# Patient Record
Sex: Male | Born: 1965
Health system: Southern US, Community
[De-identification: ages and names within clinical notes are randomized; demographics above are authoritative.]

## PROBLEM LIST (undated history)

## (undated) DIAGNOSIS — D649 Anemia, unspecified: Secondary | ICD-10-CM

## (undated) HISTORY — PX: APPENDECTOMY: SHX54

---

## 2020-12-20 DIAGNOSIS — E8809 Other disorders of plasma-protein metabolism, not elsewhere classified: Secondary | ICD-10-CM | POA: Insufficient documentation

## 2020-12-20 DIAGNOSIS — H538 Other visual disturbances: Secondary | ICD-10-CM | POA: Insufficient documentation

## 2020-12-20 HISTORY — DX: Other disorders of plasma-protein metabolism, not elsewhere classified: E88.09

## 2020-12-20 HISTORY — DX: Hypomagnesemia: E83.42

## 2020-12-20 HISTORY — DX: Other visual disturbances: H53.8

## 2020-12-31 ENCOUNTER — Telehealth: Payer: Self-pay | Admitting: Hematology

## 2020-12-31 NOTE — Telephone Encounter (Signed)
I received a call from Jan, navigator from [facility] in [place] for multiple myeloma. Pt has been scheduled to see Dr. Irene Limbo on 1/4 at [place]. Jan will provide the appt date and time to the pt. Aware for him to arrive 30 minutes early.

## 2020-12-31 NOTE — Progress Notes (Signed)
HEMATOLOGY/ONCOLOGY CONSULTATION NOTE  Date of Service: 01/01/2021  Patient Care Team: Pcp, No as PCP - General  CHIEF COMPLAINTS/PURPOSE OF CONSULTATION:  Multiple myeloma  HISTORY OF PRESENTING ILLNESS:   Donald Suarez is a wonderful 55 y.o. male who has been referred to [name] by Dr. Red Christians for evaluation and management of Multiple Myeloma. Pt is accompanied today by his brother-in-law and a [place] interpreter. The pt reports that he is doing well overall.   The pt reports that he was experiencing dizziness and blurry vision prior to his diagnosis. These symptoms started two months ago. He is unable to confirm any other new symptoms at that time. The pt was at work when he became confused and was taken to the hospital. He is still unable to drive due to his blurry vision. He reports minimal improvement in his vision after plasmapheresis and has seen an Opthalmalogist. Pt was started on CyBorD at [facility].   Pt denies any previous chronic medical conditions or chronic medications. The pt had an appendectomy in 2002 and denies any other surgeries. He has no known medication allergies. 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 Pt has received two COVID19 vaccines.   Of note prior to the patient's visit today, pt has had PET/CT completed on 12/26/2020 with results revealing "1. No focal osseous or soft tissue hypermetabolism to suggest metabolically active multiple myeloma. 2. Small lucencies noted within the right frontal calvarium, right T1 vertebral body and T4 vertebral body, nonspecific. Attention on follow-up imaging."  Pt has had MRI Brain completed on 12/26/2020 with results revealing "1. No definite T2 signal abnormality or pathologic enhancement along the visualized optic pathways, noting motion degradation of coronal STIR images through the orbits. No retrobulbar mass and symmetric extraocular muscles. 2. No acute intracranial abnormality. 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 3. Questionable small faintly enhancing lesions in the right parietal and left temporal calvarium, indeterminate but favored to represent vasculature and less likely multiple myelomatous disease. Heterogeneous clivus without associated enhancement, nonspecific."  Pt has had Right Iliac Creast BM Bx completed on 12/25/2020 with results revealing "95% cellular bone marrow with greater than 90% involvement by monoclonal plasma cells showing cytoplasmic lambda light chain restriction."  Most recent lab results (12/31/2020) of CBC is as follows: all values are WNL except for  RBC at 2.70, Hgb at 8.0, HCT at 23.2, RDW at 20.2, PLT at 129K, Sodium at 132, Glucose at 138, Albumin at 2.1, Total Protein at 11.3, AST at 60, ALT at 190. 12/31/2020 Beta 2 Microglobulin at 3.69 12/24/2020 M Spike at 8.03 g/dL 12/20/2020 IgG at 10231  On review of systems, pt reports blurry vision and denies bone pain, fatigue, loss of appetite, SOB, chest pain, headaches, dental pain, abdominal pain, leg swelling, tingling/numbness in hands/feet, pain along spine and any other symptoms.   On Social Hx the pt reports that he is non-smoker that does not drink much alcohol.   MEDICAL HISTORY:  None  SURGICAL HISTORY: Appendectomy in 2002  SOCIAL HISTORY: Social History   Socioeconomic History  . Marital status: Married    Spouse name: Not on file  . Number of children: Not on file  . Years of education: Not on file  . Highest education level: Not on file  Occupational History  . Not on file  Tobacco Use  . Smoking status: Not on file  . Smokeless tobacco: Not on file  Substance and Sexual Activity  . Alcohol  use: Not on file  . Drug use: Not on file  . Sexual activity: Not on file  Other Topics Concern  . Not on file  Social History Narrative  . Not on file   Social Determinants of Health   Financial Resource Strain: Not on file  Food Insecurity: Not on file  Transportation Needs: Not on file   Physical Activity: Not on file  Stress: Not on file  Social Connections: Not on file  Intimate Partner Violence: Not on file    FAMILY HISTORY: No family history on file.  ALLERGIES:  The pt has no medication allergies.  MEDICATIONS:  No current outpatient medications on file.   No current facility-administered medications for this visit.    REVIEW OF SYSTEMS:    10 Point review of Systems was done is negative except as noted above.  PHYSICAL EXAMINATION: ECOG PERFORMANCE STATUS: 1 - Symptomatic but completely ambulatory  . Vitals:   01/01/21 1115  BP: (!) 152/80  Pulse: 89  Resp: 15  Temp: 97.9 F (36.6 C)  SpO2: 100%   Filed Weights   01/01/21 1115  Weight: 175 lb 4.8 oz (79.5 kg)   .There is no height or weight on file to calculate BMI.  GENERAL:alert, in no acute distress and comfortable SKIN: no acute rashes, no significant lesions EYES: conjunctiva are pink and non-injected, sclera anicteric OROPHARYNX: MMM, no exudates, no oropharyngeal erythema or ulceration NECK: supple, no JVD LYMPH:  no palpable lymphadenopathy in the cervical, axillary or inguinal regions LUNGS: clear to auscultation b/l with normal respiratory effort HEART: regular rate & rhythm ABDOMEN:  normoactive bowel sounds , non tender, not distended. Extremity: no pedal edema PSYCH: alert & oriented x 3 with fluent speech NEURO: no focal motor/sensory deficits  LABORATORY DATA:  I have reviewed the data as listed  .No flowsheet data found.  .No flowsheet data found.   RADIOGRAPHIC STUDIES: I have personally reviewed the radiological images as listed and agreed with the findings in the report. No results found.  12/26/2020 [facility] MRI Brain:   12/26/2020 [facility] PET/CT:   ASSESSMENT & PLAN:   22 yo with   1) Newly diagnosed IgG Lambda Multiple myeloma with -anemia -renal insufficiency -no overt bone lesions Bx- 90% plasma cells Initial M spike 8.03g/dl FISH--pending  2)  s/p- Hyperviscosity syndrome requiring plasmapheresis.  PLAN: -Discussed patient's most recent labs from 12/31/2020, all values are WNL except for  RBC at 2.70, Hgb at 8.0, HCT at 23.2, RDW at 20.2, PLT at 129K, Sodium at 132, Glucose at 138, Albumin at 2.1, Total Protein at 11.3, AST at 60, ALT at 190. -Discussed 12/31/2020 Beta 2 Microglobulin at 3.69 -Discussed 12/24/2020 M Spike at 8.03 g/dL -Discussed 12/20/2020 IgG at 10231 -Advised pt that he has Multiple Myeloma based on his lab results.  -Advised pt that his dizziness was most likely caused by his anemia.  -Advised pt that the increased protein caused the confusion by clogging up his blood vessels and thickening his blood. -Discussed CRAB criteria: No hypercalcemia, no change in renal function, significant anemia, and no bone lesions identified.  -Advised pt that while Multiple Myeloma is not curable, it is treatable and may even reach remission.  -Advised pt that we would continue treatment with CyBorD. Discussed treatment frequency and burden of care. -Advised pt that final treament plan will be confirmed after genetics are evaluated for risk stratification.  -Advised pt that his functional status & work environment would determine his ability to continue  working. -Discussed CDC guidelines regarding the Glide booster. Recommend pt receive the booster ASAP. -Recommend pt drink at least 2L of water daily. -Recommend pt begin a daily baby ASA. -Recommend pt eat more nuts -Will begin pt on 5000 IU Vitamin D.  -Will get chemo-counseling ASAP -Will restart CyBorD in our clinic in 3-5 days -Will refer pt to our Education officer, museum ASAP -Will get labs today -Will see back in 2 weeks   FOLLOW UP: Labs today Chemo-counseling for CyBorD ASAP Plz schedule to start CyBOrD ASAP in 3-5 days (his planned dose from [facility] was due on 01/04/2021) MD visit in 2 weeks with C1D8 of treatment  Labs with D1 and D8 of each cycle of treatment. -social  worker referral ASAP for insurance and work disability questions.   All of the patients questions were answered with apparent satisfaction. The patient knows to call the clinic with any problems, questions or concerns.  I spent 60 mins counseling the patient face to face. The total time spent in the appointment was 80 minutes and more than 50% was on counseling and direct patient cares.    Sullivan Lone MD [place] AAHIVMS [facility] Hematology/Oncology Physician [facility]  (Office):       [phone] (Work cell):  [phone] (Fax):           [phone]  01/01/2021 12:40 PM  I, Yevette Edwards, am acting as a scribe for Dr. Sullivan Lone.   .I have reviewed the above documentation for accuracy and completeness, and I agree with the above. Brunetta Genera MD

## 2021-01-01 ENCOUNTER — Other Ambulatory Visit: Payer: Self-pay

## 2021-01-01 ENCOUNTER — Inpatient Hospital Stay: Payer: BC Managed Care – PPO

## 2021-01-01 ENCOUNTER — Inpatient Hospital Stay: Payer: BC Managed Care – PPO | Attending: Hematology | Admitting: Hematology

## 2021-01-01 VITALS — BP 152/80 | HR 89 | Temp 97.9°F | Resp 15 | Ht 66.0 in | Wt 175.3 lb

## 2021-01-01 DIAGNOSIS — D751 Secondary polycythemia: Secondary | ICD-10-CM | POA: Insufficient documentation

## 2021-01-01 DIAGNOSIS — D759 Disease of blood and blood-forming organs, unspecified: Secondary | ICD-10-CM

## 2021-01-01 DIAGNOSIS — Z7189 Other specified counseling: Secondary | ICD-10-CM

## 2021-01-01 DIAGNOSIS — Z79899 Other long term (current) drug therapy: Secondary | ICD-10-CM | POA: Diagnosis not present

## 2021-01-01 DIAGNOSIS — D649 Anemia, unspecified: Secondary | ICD-10-CM | POA: Insufficient documentation

## 2021-01-01 DIAGNOSIS — C9000 Multiple myeloma not having achieved remission: Secondary | ICD-10-CM

## 2021-01-01 DIAGNOSIS — D709 Neutropenia, unspecified: Secondary | ICD-10-CM | POA: Diagnosis not present

## 2021-01-01 DIAGNOSIS — Z5111 Encounter for antineoplastic chemotherapy: Secondary | ICD-10-CM | POA: Insufficient documentation

## 2021-01-01 DIAGNOSIS — T451X5A Adverse effect of antineoplastic and immunosuppressive drugs, initial encounter: Secondary | ICD-10-CM | POA: Insufficient documentation

## 2021-01-01 DIAGNOSIS — C9001 Multiple myeloma in remission: Secondary | ICD-10-CM | POA: Insufficient documentation

## 2021-01-01 DIAGNOSIS — N2889 Other specified disorders of kidney and ureter: Secondary | ICD-10-CM | POA: Diagnosis not present

## 2021-01-01 LAB — IRON AND TIBC
Iron: 141 ug/dL (ref 42–163)
Saturation Ratios: 73 % — ABNORMAL HIGH (ref 20–55)
TIBC: 193 ug/dL — ABNORMAL LOW (ref 202–409)
UIBC: 52 ug/dL — ABNORMAL LOW (ref 117–376)

## 2021-01-01 LAB — CMP (CANCER CENTER ONLY)
ALT: 164 U/L — ABNORMAL HIGH (ref 0–44)
AST: 57 U/L — ABNORMAL HIGH (ref 15–41)
Albumin: 2.4 g/dL — ABNORMAL LOW (ref 3.5–5.0)
Alkaline Phosphatase: 48 U/L (ref 38–126)
Anion gap: 4 — ABNORMAL LOW (ref 5–15)
BUN: 12 mg/dL (ref 6–20)
CO2: 26 mmol/L (ref 22–32)
Calcium: 8.6 mg/dL — ABNORMAL LOW (ref 8.9–10.3)
Chloride: 97 mmol/L — ABNORMAL LOW (ref 98–111)
Creatinine: 1.06 mg/dL (ref 0.61–1.24)
GFR, Estimated: >60 mL/min (ref >60)
Glucose, Bld: 281 mg/dL — ABNORMAL HIGH (ref 70–99)
Potassium: 3.6 mmol/L (ref 3.5–5.1)
Sodium: 127 mmol/L — ABNORMAL LOW (ref 135–145)
Total Bilirubin: 0.4 mg/dL (ref 0.3–1.2)
Total Protein: 12.9 g/dL — ABNORMAL HIGH (ref 6.5–8.1)

## 2021-01-01 LAB — CBC WITH DIFFERENTIAL/PLATELET
Abs Immature Granulocytes: 0.37 10*3/uL — ABNORMAL HIGH (ref 0.00–0.07)
Basophils Absolute: 0 10*3/uL (ref 0.0–0.1)
Basophils Relative: 0 %
Eosinophils Absolute: 0.2 10*3/uL (ref 0.0–0.5)
Eosinophils Relative: 2 %
HCT: 23.5 % — ABNORMAL LOW (ref 39.0–52.0)
Hemoglobin: 7.6 g/dL — ABNORMAL LOW (ref 13.0–17.0)
Immature Granulocytes: 6 %
Lymphocytes Relative: 35 %
Lymphs Abs: 2.3 10*3/uL (ref 0.7–4.0)
MCH: 28.8 pg (ref 26.0–34.0)
MCHC: 32.3 g/dL (ref 30.0–36.0)
MCV: 89 fL (ref 80.0–100.0)
Monocytes Absolute: 0.7 10*3/uL (ref 0.1–1.0)
Monocytes Relative: 11 %
Neutro Abs: 3 10*3/uL (ref 1.7–7.7)
Neutrophils Relative %: 46 %
Platelets: 155 10*3/uL (ref 150–400)
RBC: 2.64 MIL/uL — ABNORMAL LOW (ref 4.22–5.81)
RDW: 19.8 % — ABNORMAL HIGH (ref 11.5–15.5)
WBC: 6.5 10*3/uL (ref 4.0–10.5)
nRBC: 5.5 % — ABNORMAL HIGH (ref 0.0–0.2)

## 2021-01-01 LAB — LACTATE DEHYDROGENASE: LDH: 349 U/L — ABNORMAL HIGH (ref 98–192)

## 2021-01-01 LAB — FERRITIN: Ferritin: 4958 ng/mL — ABNORMAL HIGH (ref 24–336)

## 2021-01-01 LAB — VITAMIN B12: Vitamin B-12: 294 pg/mL (ref 180–914)

## 2021-01-01 LAB — VITAMIN D 25 HYDROXY (VIT D DEFICIENCY, FRACTURES): Vit D, 25-Hydroxy: 16.61 ng/mL — ABNORMAL LOW (ref 30–100)

## 2021-01-01 NOTE — Progress Notes (Signed)
START ON PATHWAY REGIMEN - Multiple Myeloma and Other Plasma Cell Dyscrasias     A cycle is every 21 days:     Dexamethasone      Bortezomib      Cyclophosphamide   **Always confirm dose/schedule in your pharmacy ordering system**  Patient Characteristics: Multiple Myeloma, Newly Diagnosed, Transplant Eligible, Unknown or Awaiting Test Results Disease Classification: Multiple Myeloma R-ISS Staging: Unknown Therapeutic Status: Newly Diagnosed Is Patient Eligible for Transplant<= Transplant Eligible Risk Status: Awaiting Test Results Intent of Therapy: Non-Curative / Palliative Intent, Discussed with Patient

## 2021-01-02 ENCOUNTER — Inpatient Hospital Stay: Payer: BC Managed Care – PPO

## 2021-01-02 ENCOUNTER — Inpatient Hospital Stay: Payer: BC Managed Care – PPO | Admitting: General Practice

## 2021-01-02 ENCOUNTER — Other Ambulatory Visit: Payer: Self-pay

## 2021-01-02 DIAGNOSIS — C9000 Multiple myeloma not having achieved remission: Secondary | ICD-10-CM

## 2021-01-02 LAB — KAPPA/LAMBDA LIGHT CHAINS
Kappa free light chain: 4.9 mg/L (ref 3.3–19.4)
Kappa, lambda light chain ratio: 0.2 — ABNORMAL LOW (ref 0.26–1.65)
Lambda free light chains: 24.4 mg/L (ref 5.7–26.3)

## 2021-01-02 LAB — BETA 2 MICROGLOBULIN, SERUM: Beta-2 Microglobulin: 2.9 mg/L — ABNORMAL HIGH (ref 0.6–2.4)

## 2021-01-02 NOTE — Progress Notes (Addendum)
[place] CSW Progress Notes  Met w patient and [name] interpreter at request of medical oncologist per urgent referral.  Concerns included loss of work and insurance.  Patient was accompanied by his sister and brother in law. He has recently moved to [place] area, after receiving initial treatment at [facility].  Patient states he has been unable to work since diagnosis, but hopes to return to work post treatment depending on his medical status.  As patient hopes to return to work prior to 12 months, CSW will not proceed w referral to [facility] for disability and Medicaid application assistance as patient appears not to meet criteria for there options.  CSW will refer to [place] Urgent Needs/[employer] program (application submitted 02/28/3347 [id number]), messaged Annamary Rummage as patient would like to apply for [facility].  Financial Advocate can also help patient apply for applicable [facility] financial assistance.  CSW also recommended patient contact DSS to apply for Food Stamps.  CSW team will continue to follow patient for additional support.  Edwyna Shell, LCSW Clinical Social Worker Phone:  [phone]

## 2021-01-03 ENCOUNTER — Other Ambulatory Visit: Payer: Self-pay | Admitting: Hematology

## 2021-01-03 LAB — HGB FRACTIONATION BY HPLC
Hgb A: 71.7 % — ABNORMAL LOW (ref 96.4–98.8)
Hgb C: 0 %
Hgb E: 22.8 % — ABNORMAL HIGH
Hgb F: 2.5 % — ABNORMAL HIGH (ref 0.0–2.0)
Hgb S: 0 %
Hgb Variant: 0 %

## 2021-01-03 LAB — MULTIPLE MYELOMA PANEL, SERUM
Albumin SerPl Elph-Mcnc: 3.8 g/dL (ref 2.9–4.4)
Albumin/Glob SerPl: 0.5 — ABNORMAL LOW (ref 0.7–1.7)
Alpha 1: 0.5 g/dL — ABNORMAL HIGH (ref 0.0–0.4)
Alpha2 Glob SerPl Elph-Mcnc: 0.7 g/dL (ref 0.4–1.0)
B-Globulin SerPl Elph-Mcnc: 1.2 g/dL (ref 0.7–1.3)
Gamma Glob SerPl Elph-Mcnc: 5.6 g/dL — ABNORMAL HIGH (ref 0.4–1.8)
Globulin, Total: 7.9 g/dL — ABNORMAL HIGH (ref 2.2–3.9)
IgA: 16 mg/dL — ABNORMAL LOW (ref 90–386)
IgG (Immunoglobin G), Serum: 8196 mg/dL — ABNORMAL HIGH (ref 603–1613)
IgM (Immunoglobulin M), Srm: 6 mg/dL — ABNORMAL LOW (ref 20–172)
M Protein SerPl Elph-Mcnc: 5.5 g/dL — ABNORMAL HIGH
Total Protein ELP: 11.7 g/dL — ABNORMAL HIGH (ref 6.0–8.5)

## 2021-01-03 LAB — HGB FRACTIONATION CASCADE: Hgb A2: 3 % (ref 1.8–3.2)

## 2021-01-03 MED ORDER — DEXAMETHASONE 4 MG PO TABS: ORAL_TABLET | ORAL | 3 refills | Status: DC

## 2021-01-03 MED ORDER — ONDANSETRON HCL 8 MG PO TABS: 8.0000 mg | ORAL_TABLET | Freq: Two times a day (BID) | ORAL | 1 refills | Status: DC | PRN

## 2021-01-03 MED ORDER — PROCHLORPERAZINE MALEATE 10 MG PO TABS: 10.0000 mg | ORAL_TABLET | Freq: Four times a day (QID) | ORAL | 1 refills | Status: DC | PRN

## 2021-01-03 MED ORDER — ACYCLOVIR 400 MG PO TABS: 400.0000 mg | ORAL_TABLET | Freq: Two times a day (BID) | ORAL | 3 refills | Status: DC

## 2021-01-03 MED FILL — DEXAMETHASONE 4 MG TABLET: 4 | 21 days supply | Qty: 10 | Fill #0

## 2021-01-03 MED FILL — ACYCLOVIR 400 MG TABLET: 400 | 30 days supply | Qty: 60 | Fill #0

## 2021-01-03 MED FILL — ONDANSETRON HCL 8 MG TABLET: 8 | 15 days supply | Qty: 30 | Fill #0

## 2021-01-04 ENCOUNTER — Other Ambulatory Visit: Payer: Self-pay | Admitting: *Deleted

## 2021-01-04 ENCOUNTER — Inpatient Hospital Stay: Payer: BC Managed Care – PPO

## 2021-01-04 ENCOUNTER — Other Ambulatory Visit: Payer: Self-pay

## 2021-01-04 VITALS — BP 145/72 | HR 92 | Temp 98.2°F | Resp 18

## 2021-01-04 DIAGNOSIS — C9000 Multiple myeloma not having achieved remission: Secondary | ICD-10-CM

## 2021-01-04 DIAGNOSIS — D649 Anemia, unspecified: Secondary | ICD-10-CM

## 2021-01-04 LAB — CBC WITH DIFFERENTIAL (CANCER CENTER ONLY)
Abs Immature Granulocytes: 0.15 10*3/uL — ABNORMAL HIGH (ref 0.00–0.07)
Basophils Absolute: 0 10*3/uL (ref 0.0–0.1)
Basophils Relative: 0 %
Eosinophils Absolute: 0.1 10*3/uL (ref 0.0–0.5)
Eosinophils Relative: 2 %
HCT: 24.3 % — ABNORMAL LOW (ref 39.0–52.0)
Hemoglobin: 7.9 g/dL — ABNORMAL LOW (ref 13.0–17.0)
Immature Granulocytes: 2 %
Lymphocytes Relative: 36 %
Lymphs Abs: 2.5 10*3/uL (ref 0.7–4.0)
MCH: 28.5 pg (ref 26.0–34.0)
MCHC: 32.5 g/dL (ref 30.0–36.0)
MCV: 87.7 fL (ref 80.0–100.0)
Monocytes Absolute: 1.2 10*3/uL — ABNORMAL HIGH (ref 0.1–1.0)
Monocytes Relative: 17 %
Neutro Abs: 3.1 10*3/uL (ref 1.7–7.7)
Neutrophils Relative %: 43 %
Platelet Count: 156 10*3/uL (ref 150–400)
RBC: 2.77 MIL/uL — ABNORMAL LOW (ref 4.22–5.81)
RDW: 19.7 % — ABNORMAL HIGH (ref 11.5–15.5)
WBC Count: 7 10*3/uL (ref 4.0–10.5)
nRBC: 9.1 % — ABNORMAL HIGH (ref 0.0–0.2)

## 2021-01-04 LAB — CMP (CANCER CENTER ONLY)
ALT: 102 U/L — ABNORMAL HIGH (ref 0–44)
AST: 42 U/L — ABNORMAL HIGH (ref 15–41)
Albumin: 2.2 g/dL — ABNORMAL LOW (ref 3.5–5.0)
Alkaline Phosphatase: 57 U/L (ref 38–126)
Anion gap: 6 (ref 5–15)
BUN: 11 mg/dL (ref 6–20)
CO2: 19 mmol/L — ABNORMAL LOW (ref 22–32)
Calcium: 8.1 mg/dL — ABNORMAL LOW (ref 8.9–10.3)
Chloride: 99 mmol/L (ref 98–111)
Creatinine: 1.12 mg/dL (ref 0.61–1.24)
GFR, Estimated: >60 mL/min (ref >60)
Glucose, Bld: 202 mg/dL — ABNORMAL HIGH (ref 70–99)
Potassium: 4 mmol/L (ref 3.5–5.1)
Sodium: 124 mmol/L — ABNORMAL LOW (ref 135–145)
Total Bilirubin: 0.3 mg/dL (ref 0.3–1.2)
Total Protein: 12.7 g/dL — ABNORMAL HIGH (ref 6.5–8.1)

## 2021-01-04 LAB — SAMPLE TO BLOOD BANK

## 2021-01-04 MED ORDER — BORTEZOMIB CHEMO SQ INJECTION 3.5 MG (2.5MG/ML)
1.3000 mg/m2 | Freq: Once | INTRAMUSCULAR | Status: AC
  Administered 2021-01-04: 2.5 mg via SUBCUTANEOUS
  Filled 2021-01-04: qty 1

## 2021-01-04 MED ORDER — SODIUM CHLORIDE 0.9 % IV SOLN
40.0000 mg | Freq: Once | INTRAVENOUS | Status: AC
  Administered 2021-01-04: 40 mg via INTRAVENOUS
  Filled 2021-01-04: qty 4

## 2021-01-04 MED ORDER — PALONOSETRON HCL INJECTION 0.25 MG/5ML
INTRAVENOUS | Status: AC
  Filled 2021-01-04: qty 5

## 2021-01-04 MED ORDER — SODIUM CHLORIDE 0.9 % IV SOLN
400.0000 mg/m2 | Freq: Once | INTRAVENOUS | Status: AC
  Administered 2021-01-04: 740 mg via INTRAVENOUS
  Filled 2021-01-04: qty 37

## 2021-01-04 MED ORDER — PALONOSETRON HCL INJECTION 0.25 MG/5ML
0.2500 mg | Freq: Once | INTRAVENOUS | Status: AC
  Administered 2021-01-04: 0.25 mg via INTRAVENOUS

## 2021-01-04 MED ORDER — SODIUM CHLORIDE 0.9 % IV SOLN
Freq: Once | INTRAVENOUS | Status: AC
  Filled 2021-01-04: qty 250

## 2021-01-04 MED FILL — PROCHLORPERAZINE 10 MG TAB: 10 | 7 days supply | Qty: 30 | Fill #0

## 2021-01-04 NOTE — Patient Instructions (Signed)
[facility] Discharge Instructions for Patients Receiving Chemotherapy  Today you received the following chemotherapy agents velcade, cyclophosmamide  To help prevent nausea and vomiting after your treatment, we encourage you to take your nausea medication as directed.   If you develop nausea and vomiting that is not controlled by your nausea medication, call the clinic.   BELOW ARE SYMPTOMS THAT SHOULD BE REPORTED IMMEDIATELY:  *FEVER GREATER THAN 100.5 F  *CHILLS WITH OR WITHOUT FEVER  NAUSEA AND VOMITING THAT IS NOT CONTROLLED WITH YOUR NAUSEA MEDICATION  *UNUSUAL SHORTNESS OF BREATH  *UNUSUAL BRUISING OR BLEEDING  TENDERNESS IN MOUTH AND THROAT WITH OR WITHOUT PRESENCE OF ULCERS  *URINARY PROBLEMS  *BOWEL PROBLEMS  UNUSUAL RASH Items with * indicate a potential emergency and should be followed up as soon as possible.  Feel free to call the clinic should you have any questions or concerns. The clinic phone number is [phone].  Please show the [place] at check-in to the Emergency Department and triage nurse.

## 2021-01-07 ENCOUNTER — Inpatient Hospital Stay: Payer: BC Managed Care – PPO

## 2021-01-07 ENCOUNTER — Other Ambulatory Visit: Payer: Self-pay

## 2021-01-07 VITALS — BP 135/83 | HR 98 | Temp 98.5°F | Resp 18

## 2021-01-07 DIAGNOSIS — C9000 Multiple myeloma not having achieved remission: Secondary | ICD-10-CM

## 2021-01-07 MED ORDER — PROCHLORPERAZINE MALEATE 10 MG PO TABS
ORAL_TABLET | ORAL | Status: AC
  Filled 2021-01-07: qty 1

## 2021-01-07 MED ORDER — BORTEZOMIB CHEMO SQ INJECTION 3.5 MG (2.5MG/ML)
1.3000 mg/m2 | Freq: Once | INTRAMUSCULAR | Status: AC
  Administered 2021-01-07: 2.5 mg via SUBCUTANEOUS
  Filled 2021-01-07: qty 1

## 2021-01-07 MED ORDER — PROCHLORPERAZINE MALEATE 10 MG PO TABS
10.0000 mg | ORAL_TABLET | Freq: Once | ORAL | Status: AC
  Administered 2021-01-07: 10 mg via ORAL

## 2021-01-07 NOTE — Patient Instructions (Signed)
[facility] Discharge Instructions for Patients Receiving Chemotherapy  Today you received the following chemotherapy agents: bortezomib.  To help prevent nausea and vomiting after your treatment, we encourage you to take your nausea medication as directed.   If you develop nausea and vomiting that is not controlled by your nausea medication, call the clinic.   BELOW ARE SYMPTOMS THAT SHOULD BE REPORTED IMMEDIATELY:  *FEVER GREATER THAN 100.5 F  *CHILLS WITH OR WITHOUT FEVER  NAUSEA AND VOMITING THAT IS NOT CONTROLLED WITH YOUR NAUSEA MEDICATION  *UNUSUAL SHORTNESS OF BREATH  *UNUSUAL BRUISING OR BLEEDING  TENDERNESS IN MOUTH AND THROAT WITH OR WITHOUT PRESENCE OF ULCERS  *URINARY PROBLEMS  *BOWEL PROBLEMS  UNUSUAL RASH Items with * indicate a potential emergency and should be followed up as soon as possible.  Feel free to call the clinic should you have any questions or concerns. The clinic phone number is [phone].  Please show the CHEMO ALERT CARD at check-in to the Emergency Department and triage nurse.   

## 2021-01-08 ENCOUNTER — Other Ambulatory Visit: Payer: Self-pay | Admitting: Hematology

## 2021-01-11 ENCOUNTER — Inpatient Hospital Stay: Payer: BC Managed Care – PPO

## 2021-01-11 ENCOUNTER — Inpatient Hospital Stay: Payer: BC Managed Care – PPO | Admitting: Hematology

## 2021-01-11 ENCOUNTER — Other Ambulatory Visit: Payer: Self-pay | Admitting: *Deleted

## 2021-01-11 ENCOUNTER — Inpatient Hospital Stay: Payer: BC Managed Care – PPO | Admitting: General Practice

## 2021-01-11 ENCOUNTER — Ambulatory Visit: Payer: BC Managed Care – PPO

## 2021-01-11 ENCOUNTER — Other Ambulatory Visit: Payer: Self-pay

## 2021-01-11 VITALS — BP 138/85 | HR 97 | Temp 98.0°F | Resp 18 | Ht 66.0 in | Wt 176.8 lb

## 2021-01-11 DIAGNOSIS — C9000 Multiple myeloma not having achieved remission: Secondary | ICD-10-CM

## 2021-01-11 DIAGNOSIS — Z5111 Encounter for antineoplastic chemotherapy: Secondary | ICD-10-CM | POA: Diagnosis not present

## 2021-01-11 DIAGNOSIS — D649 Anemia, unspecified: Secondary | ICD-10-CM

## 2021-01-11 LAB — CMP (CANCER CENTER ONLY)
ALT: 140 U/L — ABNORMAL HIGH (ref 0–44)
AST: 37 U/L (ref 15–41)
Albumin: 2.3 g/dL — ABNORMAL LOW (ref 3.5–5.0)
Alkaline Phosphatase: 111 U/L (ref 38–126)
Anion gap: 9 (ref 5–15)
BUN: 16 mg/dL (ref 6–20)
CO2: 22 mmol/L (ref 22–32)
Calcium: 8 mg/dL — ABNORMAL LOW (ref 8.9–10.3)
Chloride: 103 mmol/L (ref 98–111)
Creatinine: 0.82 mg/dL (ref 0.61–1.24)
GFR, Estimated: >60 mL/min (ref >60)
Glucose, Bld: 245 mg/dL — ABNORMAL HIGH (ref 70–99)
Potassium: 3.3 mmol/L — ABNORMAL LOW (ref 3.5–5.1)
Sodium: 134 mmol/L — ABNORMAL LOW (ref 135–145)
Total Bilirubin: 0.3 mg/dL (ref 0.3–1.2)
Total Protein: 8.7 g/dL — ABNORMAL HIGH (ref 6.5–8.1)

## 2021-01-11 LAB — CBC WITH DIFFERENTIAL (CANCER CENTER ONLY)
Abs Immature Granulocytes: 0.02 10*3/uL (ref 0.00–0.07)
Basophils Absolute: 0 10*3/uL (ref 0.0–0.1)
Basophils Relative: 0 %
Eosinophils Absolute: 0 10*3/uL (ref 0.0–0.5)
Eosinophils Relative: 0 %
HCT: 26 % — ABNORMAL LOW (ref 39.0–52.0)
Hemoglobin: 8.6 g/dL — ABNORMAL LOW (ref 13.0–17.0)
Immature Granulocytes: 1 %
Lymphocytes Relative: 58 %
Lymphs Abs: 1.6 10*3/uL (ref 0.7–4.0)
MCH: 28.9 pg (ref 26.0–34.0)
MCHC: 33.1 g/dL (ref 30.0–36.0)
MCV: 87.2 fL (ref 80.0–100.0)
Monocytes Absolute: 0.5 10*3/uL (ref 0.1–1.0)
Monocytes Relative: 20 %
Neutro Abs: 0.6 10*3/uL — ABNORMAL LOW (ref 1.7–7.7)
Neutrophils Relative %: 21 %
Platelet Count: 139 10*3/uL — ABNORMAL LOW (ref 150–400)
RBC: 2.98 MIL/uL — ABNORMAL LOW (ref 4.22–5.81)
RDW: 19.1 % — ABNORMAL HIGH (ref 11.5–15.5)
WBC Count: 2.7 10*3/uL — ABNORMAL LOW (ref 4.0–10.5)
nRBC: 3.3 % — ABNORMAL HIGH (ref 0.0–0.2)

## 2021-01-11 LAB — SAMPLE TO BLOOD BANK

## 2021-01-11 MED ORDER — SODIUM CHLORIDE 0.9 % IV SOLN
40.0000 mg | Freq: Once | INTRAVENOUS | Status: AC
  Administered 2021-01-11: 40 mg via INTRAVENOUS
  Filled 2021-01-11: qty 4

## 2021-01-11 MED ORDER — ERGOCALCIFEROL 1.25 MG (50000 UT) PO CAPS: 50000.0000 [IU] | ORAL_CAPSULE | ORAL | 2 refills | Status: DC

## 2021-01-11 MED ORDER — PALONOSETRON HCL INJECTION 0.25 MG/5ML
INTRAVENOUS | Status: AC
  Filled 2021-01-11: qty 5

## 2021-01-11 MED ORDER — SODIUM CHLORIDE 0.9 % IV SOLN
Freq: Once | INTRAVENOUS | Status: AC
  Filled 2021-01-11: qty 250

## 2021-01-11 MED ORDER — B COMPLEX VITAMINS PO CAPS
1.0000 | ORAL_CAPSULE | Freq: Every day | ORAL | 2 refills | Status: DC
Start: 2021-01-11 — End: 2021-04-17

## 2021-01-11 MED ORDER — BORTEZOMIB CHEMO SQ INJECTION 3.5 MG (2.5MG/ML)
1.3000 mg/m2 | Freq: Once | INTRAMUSCULAR | Status: AC
  Administered 2021-01-11: 2.5 mg via SUBCUTANEOUS
  Filled 2021-01-11: qty 1

## 2021-01-11 MED ORDER — PALONOSETRON HCL INJECTION 0.25 MG/5ML
0.2500 mg | Freq: Once | INTRAVENOUS | Status: AC
Start: 2021-01-11 — End: 2021-01-11
  Administered 2021-01-11: 0.25 mg via INTRAVENOUS

## 2021-01-11 NOTE — Progress Notes (Signed)
HEMATOLOGY/ONCOLOGY CONSULTATION NOTE  Date of Service: 01/11/2021  Patient Care Team: Pcp, No as PCP - General  CHIEF COMPLAINTS/PURPOSE OF CONSULTATION:  Multiple myeloma  HISTORY OF PRESENTING ILLNESS:   Donald Suarez is a wonderful 55 y.o. male who has been referred to [name] by Dr. Red Christians for evaluation and management of Multiple Myeloma. Pt is accompanied today by his brother-in-law and a [place] interpreter. The pt reports that he is doing well overall.   The pt reports that he was experiencing dizziness and blurry vision prior to his diagnosis. These symptoms started two months ago. He is unable to confirm any other new symptoms at that time. The pt was at work when he became confused and was taken to the hospital. He is still unable to drive due to his blurry vision. He reports minimal improvement in his vision after plasmapheresis and has seen an Opthalmalogist. Pt was started on CyBorD at [facility].   Pt denies any previous chronic medical conditions or chronic medications. The pt had an appendectomy in 2002 and denies any other surgeries. He has no known medication allergies. 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 Pt has received two COVID19 vaccines.   Of note prior to the patient's visit today, pt has had PET/CT completed on 12/26/2020 with results revealing "1. No focal osseous or soft tissue hypermetabolism to suggest metabolically active multiple myeloma. 2. Small lucencies noted within the right frontal calvarium, right T1 vertebral body and T4 vertebral body, nonspecific. Attention on follow-up imaging."  Pt has had MRI Brain completed on 12/26/2020 with results revealing "1. No definite T2 signal abnormality or pathologic enhancement along the visualized optic pathways, noting motion degradation of coronal STIR images through the orbits. No retrobulbar mass and symmetric extraocular muscles. 2. No acute intracranial abnormality. 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 3. Questionable small faintly enhancing lesions in the right parietal and left temporal calvarium, indeterminate but favored to represent vasculature and less likely multiple myelomatous disease. Heterogeneous clivus without associated enhancement, nonspecific."  Pt has had Right Iliac Creast BM Bx completed on 12/25/2020 with results revealing "95% cellular bone marrow with greater than 90% involvement by monoclonal plasma cells showing cytoplasmic lambda light chain restriction."  Most recent lab results (12/31/2020) of CBC is as follows: all values are WNL except for  RBC at 2.70, Hgb at 8.0, HCT at 23.2, RDW at 20.2, PLT at 129K, Sodium at 132, Glucose at 138, Albumin at 2.1, Total Protein at 11.3, AST at 60, ALT at 190. 12/31/2020 Beta 2 Microglobulin at 3.69 12/24/2020 M Spike at 8.03 g/dL 12/20/2020 IgG at 10231  On review of systems, pt reports blurry vision and denies bone pain, fatigue, loss of appetite, SOB, chest pain, headaches, dental pain, abdominal pain, leg swelling, tingling/numbness in hands/feet, pain along spine and any other symptoms.   On Social Hx the pt reports that he is non-smoker that does not drink much alcohol.    INTERVAL HISTORY:  Donald Suarez is a wonderful 55 y.o.male who is here for evaluation and management of Multiple Myeloma. The patient's last visit with [name] was on 01/01/2021. The pt reports that he is doing well overall.  The pt reports less dizziness and fatigue and improvement in his vision.  Lab results today (01/11/21) of CBC w/diff and CMP -reviewed with patient.  No new bone pains. Good po intake  MEDICAL HISTORY:  None  SURGICAL HISTORY: Appendectomy in 2002  SOCIAL HISTORY: Social History  Socioeconomic History  . Marital status: Married    Spouse name: Not on file  . Number of children: Not on file  . Years of education: Not on file  . Highest education level: Not on file  Occupational History  . Not on file  Tobacco Use  .  Smoking status: Not on file  . Smokeless tobacco: Not on file  Substance and Sexual Activity  . Alcohol use: Not on file  . Drug use: Not on file  . Sexual activity: Not on file  Other Topics Concern  . Not on file  Social History Narrative  . Not on file   Social Determinants of Health   Financial Resource Strain: Not on file  Food Insecurity: Not on file  Transportation Needs: Not on file  Physical Activity: Not on file  Stress: Not on file  Social Connections: Not on file  Intimate Partner Violence: Not on file    FAMILY HISTORY: No family history on file.  ALLERGIES:  The pt has no medication allergies.  MEDICATIONS:  Current Outpatient Medications  Medication Sig Dispense Refill  . acyclovir (ZOVIRAX) 400 MG tablet Take 1 tablet (400 mg total) by mouth 2 (two) times daily. 60 tablet 3  . dexamethasone (DECADRON) 4 MG tablet Take 10 tablets (40 mg) on Day 15 of each cycle. Repeat every 21 days.Take with breakfast. 10 tablet 3  . ondansetron (ZOFRAN) 8 MG tablet Take 1 tablet (8 mg total) by mouth 2 (two) times daily as needed for refractory nausea / vomiting. Start on day 3 after Cytoxan. 30 tablet 1  . prochlorperazine (COMPAZINE) 10 MG tablet Take 1 tablet (10 mg total) by mouth every 6 (six) hours as needed (Nausea or vomiting). 30 tablet 1   No current facility-administered medications for this visit.    REVIEW OF SYSTEMS:   A 10+ POINT REVIEW OF SYSTEMS WAS OBTAINED including neurology, dermatology, psychiatry, cardiac, respiratory, lymph, extremities, GI, GU, Musculoskeletal, constitutional, breasts, reproductive, HEENT.  All pertinent positives are noted in the HPI.  All others are negative.   PHYSICAL EXAMINATION: ECOG PERFORMANCE STATUS: 1 - Symptomatic but completely ambulatory  . There were no vitals filed for this visit. There were no vitals filed for this visit. .There is no height or weight on file to calculate BMI.  NAD GENERAL:alert, in no acute  distress and comfortable SKIN: no acute rashes, no significant lesions EYES: conjunctiva are pink and non-injected, sclera anicteric OROPHARYNX: MMM, no exudates, no oropharyngeal erythema or ulceration NECK: supple, no JVD LYMPH:  no palpable lymphadenopathy in the cervical, axillary or inguinal regions LUNGS: clear to auscultation b/l with normal respiratory effort HEART: regular rate & rhythm ABDOMEN:  normoactive bowel sounds , non tender, not distended. No palpable hepatosplenomegaly.  Extremity: no pedal edema PSYCH: alert & oriented x 3 with fluent speech NEURO: no focal motor/sensory deficits  LABORATORY DATA:  I have reviewed the data as listed  . CBC Latest Ref Rng & Units 01/11/2021 01/04/2021 01/01/2021  WBC 4.0 - 10.5 K/uL 2.7(L) 7.0 6.5  Hemoglobin 13.0 - 17.0 g/dL 8.6(L) 7.9(L) 7.6(L)  Hematocrit 39.0 - 52.0 % 26.0(L) 24.3(L) 23.5(L)  Platelets 150 - 400 K/uL 139(L) 156 155   ANC 600  . CMP Latest Ref Rng & Units 01/11/2021 01/04/2021 01/01/2021  Glucose 70 - 99 mg/dL 245(H) 202(H) 281(H)  BUN 6 - 20 mg/dL 16 11 12   Creatinine 0.61 - 1.24 mg/dL 0.82 1.12 1.06  Sodium 135 - 145 mmol/L 134(L) 124(L)  127(L)  Potassium 3.5 - 5.1 mmol/L 3.3(L) 4.0 3.6  Chloride 98 - 111 mmol/L 103 99 97(L)  CO2 22 - 32 mmol/L 22 19(L) 26  Calcium 8.9 - 10.3 mg/dL 8.0(L) 8.1(L) 8.6(L)  Total Protein 6.5 - 8.1 g/dL 8.7(H) 12.7(H) 12.9(H)  Total Bilirubin 0.3 - 1.2 mg/dL 0.3 0.3 0.4  Alkaline Phos 38 - 126 U/L 111 57 48  AST 15 - 41 U/L 37 42(H) 57(H)  ALT 0 - 44 U/L 140(H) 102(H) 164(H)     RADIOGRAPHIC STUDIES: I have personally reviewed the radiological images as listed and agreed with the findings in the report. No results found.  12/26/2020 [facility] MRI Brain:   12/26/2020 [facility] PET/CT:   ASSESSMENT & PLAN:   74 yo with   1) Newly diagnosed IgG Lambda Multiple myeloma with -anemia -renal insufficiency -no overt bone lesions Bx- 90% plasma cells Initial M spike  8.03g/dl FISH--pending from outside labs  2) s/p- Hyperviscosity syndrome requiring plasmapheresis.  3) neutropenia related to chemotherapy -- primarily cytoxan PLAN: -labs reviewed with patient. -significant neutropenia with cytoxan-- will hold cytoxan dose today and reduce subsequent does to 38m/m2 -continue Velcade at current dose -continue baby ASA. -meeting with Social work to discuss applying for social security disability benefits.  FOLLOW UP: Plz schedule C2 of CyBorD as ordered. Labs with D1 and D8 of treatment MD visit with C2D1      The total time spent in the appt was 30 minutes and more than 50% was on counseling and direct patient cares, ordering and mx of chemotherapy  All of the patient's questions were answered with apparent satisfaction. The patient knows to call the clinic with any problems, questions or concerns.    GSullivan LoneMD MEast CarrollAAHIVMS [facility]rad[facility] Place [facility] Laser CenterLLCCTexas [facility] Surgery Center AddisonHematology/Oncology Physician [facility] (Office):       [phone](Work cell):  [phone](Fax):           [phone] 01/11/2021 3:16 AM  I, JYevette Edwards am acting as a scribe for Dr. GSullivan Lone   .I have reviewed the above documentation for accuracy and completeness, and I agree with the above. .Brunetta GeneraMD

## 2021-01-11 NOTE — Progress Notes (Signed)
[place] CSW Progress Notes  Met w patient in infusion, [place] interpreter Ronny Flurry present and interpreting for patient.  He is out of work due to cancer and treatment needed.  He does not know when he will be able to work but is unable to do so now.  He would like to apply for Social Security disability benefits - CSW explained process and will submit referral to [facility].  [place] may also be able to help with a Medicaid and Food Stamps application, will request this help on patient behalf.   No other concerns at this time.  Edwyna Shell, LCSW Clinical Social Worker Phone:  [phone]

## 2021-01-11 NOTE — Patient Instructions (Signed)
[facility] Discharge Instructions for Patients Receiving Chemotherapy  Today you received the following chemotherapy agents: bortezomib.  To help prevent nausea and vomiting after your treatment, we encourage you to take your nausea medication as directed.   If you develop nausea and vomiting that is not controlled by your nausea medication, call the clinic.   BELOW ARE SYMPTOMS THAT SHOULD BE REPORTED IMMEDIATELY:  *FEVER GREATER THAN 100.5 F  *CHILLS WITH OR WITHOUT FEVER  NAUSEA AND VOMITING THAT IS NOT CONTROLLED WITH YOUR NAUSEA MEDICATION  *UNUSUAL SHORTNESS OF BREATH  *UNUSUAL BRUISING OR BLEEDING  TENDERNESS IN MOUTH AND THROAT WITH OR WITHOUT PRESENCE OF ULCERS  *URINARY PROBLEMS  *BOWEL PROBLEMS  UNUSUAL RASH Items with * indicate a potential emergency and should be followed up as soon as possible.  Feel free to call the clinic should you have any questions or concerns. The clinic phone number is [phone].  Please show the CHEMO ALERT CARD at check-in to the Emergency Department and triage nurse.   

## 2021-01-11 NOTE — Progress Notes (Signed)
Per Dr. Kale OK to treat with today's labs  

## 2021-01-14 ENCOUNTER — Ambulatory Visit: Payer: BC Managed Care – PPO

## 2021-01-14 ENCOUNTER — Other Ambulatory Visit: Payer: BC Managed Care – PPO

## 2021-01-14 LAB — MULTIPLE MYELOMA PANEL, SERUM
Albumin SerPl Elph-Mcnc: 2.8 g/dL — ABNORMAL LOW (ref 2.9–4.4)
Albumin/Glob SerPl: 0.6 — ABNORMAL LOW (ref 0.7–1.7)
Alpha 1: 0.2 g/dL (ref 0.0–0.4)
Alpha2 Glob SerPl Elph-Mcnc: 1.1 g/dL — ABNORMAL HIGH (ref 0.4–1.0)
B-Globulin SerPl Elph-Mcnc: 1 g/dL (ref 0.7–1.3)
Gamma Glob SerPl Elph-Mcnc: 3.1 g/dL — ABNORMAL HIGH (ref 0.4–1.8)
Globulin, Total: 5.3 g/dL — ABNORMAL HIGH (ref 2.2–3.9)
IgA: 27 mg/dL — ABNORMAL LOW (ref 90–386)
IgG (Immunoglobin G), Serum: 3850 mg/dL — ABNORMAL HIGH (ref 603–1613)
IgM (Immunoglobulin M), Srm: 11 mg/dL — ABNORMAL LOW (ref 20–172)
M Protein SerPl Elph-Mcnc: 3 g/dL — ABNORMAL HIGH
Total Protein ELP: 8.1 g/dL (ref 6.0–8.5)

## 2021-01-24 NOTE — Progress Notes (Incomplete)
HEMATOLOGY/ONCOLOGY CONSULTATION NOTE  Date of Service: 01/24/2021  Patient Care Team: Pcp, No as PCP - General  CHIEF COMPLAINTS/PURPOSE OF CONSULTATION:  Multiple myeloma  HISTORY OF PRESENTING ILLNESS:   Donald Suarez is a wonderful 55 y.o. male who has been referred to [name] by Dr. Red Christians for evaluation and management of Multiple Myeloma. Pt is accompanied today by his brother-in-law and a [place] interpreter. The pt reports that he is doing well overall.   The pt reports that he was experiencing dizziness and blurry vision prior to his diagnosis. These symptoms started two months ago. He is unable to confirm any other new symptoms at that time. The pt was at work when he became confused and was taken to the hospital. He is still unable to drive due to his blurry vision. He reports minimal improvement in his vision after plasmapheresis and has seen an Opthalmalogist. Pt was started on CyBorD at [facility].   Pt denies any previous chronic medical conditions or chronic medications. The pt had an appendectomy in 2002 and denies any other surgeries. He has no known medication allergies. 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 Pt has received two COVID19 vaccines.   Of note prior to the patient's visit today, pt has had PET/CT completed on 12/26/2020 with results revealing "1. No focal osseous or soft tissue hypermetabolism to suggest metabolically active multiple myeloma. 2. Small lucencies noted within the right frontal calvarium, right T1 vertebral body and T4 vertebral body, nonspecific. Attention on follow-up imaging."  Pt has had MRI Brain completed on 12/26/2020 with results revealing "1. No definite T2 signal abnormality or pathologic enhancement along the visualized optic pathways, noting motion degradation of coronal STIR images through the orbits. No retrobulbar mass and symmetric extraocular muscles. 2. No acute intracranial abnormality. 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 3. Questionable small faintly enhancing lesions in the right parietal and left temporal calvarium, indeterminate but favored to represent vasculature and less likely multiple myelomatous disease. Heterogeneous clivus without associated enhancement, nonspecific."  Pt has had Right Iliac Creast BM Bx completed on 12/25/2020 with results revealing "95% cellular bone marrow with greater than 90% involvement by monoclonal plasma cells showing cytoplasmic lambda light chain restriction."  Most recent lab results (12/31/2020) of CBC is as follows: all values are WNL except for  RBC at 2.70, Hgb at 8.0, HCT at 23.2, RDW at 20.2, PLT at 129K, Sodium at 132, Glucose at 138, Albumin at 2.1, Total Protein at 11.3, AST at 60, ALT at 190. 12/31/2020 Beta 2 Microglobulin at 3.69 12/24/2020 M Spike at 8.03 g/dL 12/20/2020 IgG at 10231  On review of systems, pt reports blurry vision and denies bone pain, fatigue, loss of appetite, SOB, chest pain, headaches, dental pain, abdominal pain, leg swelling, tingling/numbness in hands/feet, pain along spine and any other symptoms.   On Social Hx the pt reports that he is non-smoker that does not drink much alcohol.    INTERVAL HISTORY:  Donald Suarez is a wonderful 55 y.o.male who is here for evaluation and management of Multiple Myeloma. The patient's last visit with [name] was on 01/11/2021. The pt reports that he is doing well overall. The pt is here today for toxicity check prior to C2. ***  The pt reports ***  Lab results today 01/25/2021 of CBC w/diff and CMP is as follows: all values are WNL except for ***  On review of systems, pt reports *** and denies *** and any  other symptoms.  MEDICAL HISTORY:  None  SURGICAL HISTORY: Appendectomy in 2002  SOCIAL HISTORY: Social History   Socioeconomic History  . Marital status: Married    Spouse name: Not on file  . Number of children: Not on file  . Years of education: Not on file  . Highest education level:  Not on file  Occupational History  . Not on file  Tobacco Use  . Smoking status: Not on file  . Smokeless tobacco: Not on file  Substance and Sexual Activity  . Alcohol use: Not on file  . Drug use: Not on file  . Sexual activity: Not on file  Other Topics Concern  . Not on file  Social History Narrative  . Not on file   Social Determinants of Health   Financial Resource Strain: Not on file  Food Insecurity: Not on file  Transportation Needs: Not on file  Physical Activity: Not on file  Stress: Not on file  Social Connections: Not on file  Intimate Partner Violence: Not on file    FAMILY HISTORY: No family history on file.  ALLERGIES:  The pt has no medication allergies.  MEDICATIONS:  Current Outpatient Medications  Medication Sig Dispense Refill  . acyclovir (ZOVIRAX) 400 MG tablet Take 1 tablet (400 mg total) by mouth 2 (two) times daily. 60 tablet 3  . b complex vitamins capsule Take 1 capsule by mouth daily. 30 capsule 2  . dexamethasone (DECADRON) 4 MG tablet Take 10 tablets (40 mg) on Day 15 of each cycle. Repeat every 21 days.Take with breakfast. 10 tablet 3  . ergocalciferol (VITAMIN D2) 1.25 MG (50000 UT) capsule Take 1 capsule (50,000 Units total) by mouth once a week. 12 capsule 2  . ondansetron (ZOFRAN) 8 MG tablet Take 1 tablet (8 mg total) by mouth 2 (two) times daily as needed for refractory nausea / vomiting. Start on day 3 after Cytoxan. 30 tablet 1  . prochlorperazine (COMPAZINE) 10 MG tablet Take 1 tablet (10 mg total) by mouth every 6 (six) hours as needed (Nausea or vomiting). 30 tablet 1   No current facility-administered medications for this visit.    REVIEW OF SYSTEMS:   10 Point review of Systems was done is negative except as noted above.  PHYSICAL EXAMINATION: ECOG PERFORMANCE STATUS: 1 - Symptomatic but completely ambulatory  . There were no vitals filed for this visit. There were no vitals filed for this visit. .There is no height or  weight on file to calculate BMI.  *** GENERAL:alert, in no acute distress and comfortable SKIN: no acute rashes, no significant lesions EYES: conjunctiva are pink and non-injected, sclera anicteric OROPHARYNX: MMM, no exudates, no oropharyngeal erythema or ulceration NECK: supple, no JVD LYMPH:  no palpable lymphadenopathy in the cervical, axillary or inguinal regions LUNGS: clear to auscultation b/l with normal respiratory effort HEART: regular rate & rhythm ABDOMEN:  normoactive bowel sounds , non tender, not distended. Extremity: no pedal edema PSYCH: alert & oriented x 3 with fluent speech NEURO: no focal motor/sensory deficits    LABORATORY DATA:  I have reviewed the data as listed  . CBC Latest Ref Rng & Units 01/11/2021 01/04/2021 01/01/2021  WBC 4.0 - 10.5 K/uL 2.7(L) 7.0 6.5  Hemoglobin 13.0 - 17.0 g/dL 8.6(L) 7.9(L) 7.6(L)  Hematocrit 39.0 - 52.0 % 26.0(L) 24.3(L) 23.5(L)  Platelets 150 - 400 K/uL 139(L) 156 155   ANC 600  . CMP Latest Ref Rng & Units 01/11/2021 01/04/2021 01/01/2021  Glucose 70 -  99 mg/dL 245(H) 202(H) 281(H)  BUN 6 - 20 mg/dL 16 11 12   Creatinine 0.61 - 1.24 mg/dL 0.82 1.12 1.06  Sodium 135 - 145 mmol/L 134(L) 124(L) 127(L)  Potassium 3.5 - 5.1 mmol/L 3.3(L) 4.0 3.6  Chloride 98 - 111 mmol/L 103 99 97(L)  CO2 22 - 32 mmol/L 22 19(L) 26  Calcium 8.9 - 10.3 mg/dL 8.0(L) 8.1(L) 8.6(L)  Total Protein 6.5 - 8.1 g/dL 8.7(H) 12.7(H) 12.9(H)  Total Bilirubin 0.3 - 1.2 mg/dL 0.3 0.3 0.4  Alkaline Phos 38 - 126 U/L 111 57 48  AST 15 - 41 U/L 37 42(H) 57(H)  ALT 0 - 44 U/L 140(H) 102(H) 164(H)     RADIOGRAPHIC STUDIES: I have personally reviewed the radiological images as listed and agreed with the findings in the report. No results found.  12/26/2020 [facility] MRI Brain:   12/26/2020 [facility] PET/CT:   ASSESSMENT & PLAN:   64 yo with   1) Newly diagnosed IgG Lambda Multiple myeloma with -anemia -renal insufficiency -no overt bone lesions Bx- 90% plasma  cells Initial M spike 8.03g/dl FISH--pending from outside labs  2) s/p- Hyperviscosity syndrome requiring plasmapheresis.  3) neutropenia related to chemotherapy -- primarily cytoxan  PLAN: -Discussed labwork from today, 01/25/2021; ***   -significant neutropenia with cytoxan-- will hold cytoxan dose today and reduce subsequent does to 327m/m2 -continue Velcade at current dose -continue baby ASA. -Will see back in ***  FOLLOW UP: ***    The total time spent in the appointment was *** minutes and more than 50% was on counseling and direct patient cares.  All of the patient's questions were answered with apparent satisfaction. The patient knows to call the clinic with any problems, questions or concerns.    GSullivan LoneMD MNickersonAAHIVMS SAdventhealth Lake PlacidCPerformance Health Surgery CenterHematology/Oncology Physician CSsm [facility] (Office):       [phone](Work cell):  [phone](Fax):           [phone] 01/24/2021 5:28 PM  I, RReinaldo Raddle am acting as scribe for Dr. GSullivan Lone MD.

## 2021-01-25 ENCOUNTER — Other Ambulatory Visit: Payer: BC Managed Care – PPO

## 2021-01-25 ENCOUNTER — Inpatient Hospital Stay: Payer: BC Managed Care – PPO

## 2021-01-25 ENCOUNTER — Inpatient Hospital Stay: Payer: BC Managed Care – PPO | Admitting: Hematology

## 2021-01-28 ENCOUNTER — Other Ambulatory Visit: Payer: BC Managed Care – PPO

## 2021-01-28 ENCOUNTER — Ambulatory Visit: Payer: BC Managed Care – PPO | Admitting: Hematology

## 2021-01-28 ENCOUNTER — Other Ambulatory Visit: Payer: Self-pay | Admitting: Hematology

## 2021-01-28 ENCOUNTER — Telehealth: Payer: Self-pay

## 2021-01-28 ENCOUNTER — Ambulatory Visit: Payer: BC Managed Care – PPO

## 2021-01-28 ENCOUNTER — Telehealth: Payer: Self-pay | Admitting: Hematology

## 2021-01-28 NOTE — Telephone Encounter (Signed)
TCT patient and LVM regarding appointments today.

## 2021-01-28 NOTE — Telephone Encounter (Signed)
Hello,  Called patient regarding 01/31 appointments, added lab appointment per provider request. Contacted both home and mobile number, I was unable to leave a voicemail for both.

## 2021-02-01 ENCOUNTER — Telehealth: Payer: Self-pay | Admitting: *Deleted

## 2021-02-01 ENCOUNTER — Inpatient Hospital Stay: Payer: BC Managed Care – PPO | Attending: Hematology

## 2021-02-01 ENCOUNTER — Inpatient Hospital Stay: Payer: BC Managed Care – PPO

## 2021-02-01 NOTE — Telephone Encounter (Signed)
Patient missed appts today for lab and infusion. Attempted contact on home and mobile numbers provided. Left general message on home number (not identified) to call [place]. Called mobile number and whoever answered it hung up. Contacted Gustavo Lah (friend on [place]). He states he is related to patient through his wife. Mr. Tamala Julian given information regarding missed appts today and in past.Informed him that Dr. Irene Limbo expressed concerns regarding multiple missed appointments and treatments.  He states he will go see patient today and encourage patient to call cancer center.

## 2021-02-04 ENCOUNTER — Inpatient Hospital Stay: Payer: BC Managed Care – PPO

## 2021-04-17 ENCOUNTER — Other Ambulatory Visit: Payer: Self-pay | Admitting: Hematology

## 2022-05-13 ENCOUNTER — Emergency Department (HOSPITAL_COMMUNITY): Payer: BC Managed Care – PPO

## 2022-05-13 ENCOUNTER — Encounter (HOSPITAL_COMMUNITY): Payer: Self-pay

## 2022-05-13 ENCOUNTER — Inpatient Hospital Stay (HOSPITAL_COMMUNITY)
Admission: EM | Admit: 2022-05-13 | Discharge: 2022-05-20 | DRG: 841 | Disposition: A | Discharge status: Home Health | Payer: BC Managed Care – PPO | Attending: Internal Medicine | Admitting: Internal Medicine

## 2022-05-13 ENCOUNTER — Encounter: Payer: Self-pay | Admitting: Hematology

## 2022-05-13 ENCOUNTER — Other Ambulatory Visit: Payer: Self-pay

## 2022-05-13 DIAGNOSIS — D649 Anemia, unspecified: Secondary | ICD-10-CM | POA: Diagnosis not present

## 2022-05-13 DIAGNOSIS — D6959 Other secondary thrombocytopenia: Secondary | ICD-10-CM | POA: Diagnosis present

## 2022-05-13 DIAGNOSIS — C9001 Multiple myeloma in remission: Secondary | ICD-10-CM | POA: Diagnosis present

## 2022-05-13 DIAGNOSIS — C9000 Multiple myeloma not having achieved remission: Principal | ICD-10-CM | POA: Diagnosis present

## 2022-05-13 DIAGNOSIS — E1165 Type 2 diabetes mellitus with hyperglycemia: Secondary | ICD-10-CM

## 2022-05-13 DIAGNOSIS — Z7189 Other specified counseling: Secondary | ICD-10-CM

## 2022-05-13 DIAGNOSIS — E119 Type 2 diabetes mellitus without complications: Secondary | ICD-10-CM

## 2022-05-13 DIAGNOSIS — D630 Anemia in neoplastic disease: Secondary | ICD-10-CM | POA: Diagnosis present

## 2022-05-13 DIAGNOSIS — D696 Thrombocytopenia, unspecified: Secondary | ICD-10-CM

## 2022-05-13 DIAGNOSIS — E8809 Other disorders of plasma-protein metabolism, not elsewhere classified: Secondary | ICD-10-CM

## 2022-05-13 DIAGNOSIS — D759 Disease of blood and blood-forming organs, unspecified: Secondary | ICD-10-CM

## 2022-05-13 DIAGNOSIS — E663 Overweight: Secondary | ICD-10-CM | POA: Diagnosis present

## 2022-05-13 DIAGNOSIS — Z9221 Personal history of antineoplastic chemotherapy: Secondary | ICD-10-CM

## 2022-05-13 DIAGNOSIS — Z91199 Patient's noncompliance with other medical treatment and regimen due to unspecified reason: Secondary | ICD-10-CM

## 2022-05-13 DIAGNOSIS — Z6829 Body mass index (BMI) 29.0-29.9, adult: Secondary | ICD-10-CM

## 2022-05-13 DIAGNOSIS — E871 Hypo-osmolality and hyponatremia: Secondary | ICD-10-CM

## 2022-05-13 DIAGNOSIS — R739 Hyperglycemia, unspecified: Secondary | ICD-10-CM

## 2022-05-13 HISTORY — DX: Other disorders of plasma-protein metabolism, not elsewhere classified: E88.09

## 2022-05-13 HISTORY — DX: Anemia, unspecified: D64.9

## 2022-05-13 LAB — CBC WITH DIFFERENTIAL/PLATELET
Abs Immature Granulocytes: 1.2 10*3/uL — ABNORMAL HIGH (ref 0.00–0.07)
Band Neutrophils: 1 %
Basophils Absolute: 0.3 10*3/uL — ABNORMAL HIGH (ref 0.0–0.1)
Basophils Relative: 3 %
Blasts: 4 %
Eosinophils Absolute: 0.2 10*3/uL (ref 0.0–0.5)
Eosinophils Relative: 2 %
HCT: 19.9 % — ABNORMAL LOW (ref 39.0–52.0)
Hemoglobin: 6.7 g/dL — CL (ref 13.0–17.0)
Lymphocytes Relative: 33 %
Lymphs Abs: 3.4 10*3/uL (ref 0.7–4.0)
MCH: 31.3 pg (ref 26.0–34.0)
MCHC: 33.7 g/dL (ref 30.0–36.0)
MCV: 93 fL (ref 80.0–100.0)
Metamyelocytes Relative: 3 %
Monocytes Absolute: 0.6 10*3/uL (ref 0.1–1.0)
Monocytes Relative: 6 %
Myelocytes: 8 %
Neutro Abs: 4.1 10*3/uL (ref 1.7–7.7)
Neutrophils Relative %: 39 %
Platelets: 93 10*3/uL — ABNORMAL LOW (ref 150–400)
Promyelocytes Relative: 1 %
RBC: 2.14 MIL/uL — ABNORMAL LOW (ref 4.22–5.81)
RDW: 23.2 % — ABNORMAL HIGH (ref 11.5–15.5)
WBC: 10.3 10*3/uL (ref 4.0–10.5)
nRBC: 23 /100 WBC — ABNORMAL HIGH
nRBC: 29.5 % — ABNORMAL HIGH (ref 0.0–0.2)

## 2022-05-13 LAB — RENAL FUNCTION PANEL
Albumin: 1.6 g/dL — ABNORMAL LOW (ref 3.5–5.0)
Anion gap: 7 (ref 5–15)
BUN: 22 mg/dL — ABNORMAL HIGH (ref 6–20)
CO2: 23 mmol/L (ref 22–32)
Calcium: 10.2 mg/dL (ref 8.9–10.3)
Chloride: 99 mmol/L (ref 98–111)
Creatinine, Ser: 1.02 mg/dL (ref 0.61–1.24)
GFR, Estimated: >60 mL/min (ref >60)
Glucose, Bld: 183 mg/dL — ABNORMAL HIGH (ref 70–99)
Phosphorus: 5.5 mg/dL — ABNORMAL HIGH (ref 2.5–4.6)
Potassium: 3.8 mmol/L (ref 3.5–5.1)
Sodium: 129 mmol/L — ABNORMAL LOW (ref 135–145)

## 2022-05-13 LAB — FERRITIN: Ferritin: 774 ng/mL — ABNORMAL HIGH (ref 24–336)

## 2022-05-13 LAB — COMPREHENSIVE METABOLIC PANEL
ALT: 42 U/L (ref 0–44)
AST: 29 U/L (ref 15–41)
Albumin: 1.6 g/dL — ABNORMAL LOW (ref 3.5–5.0)
Alkaline Phosphatase: 43 U/L (ref 38–126)
Anion gap: 5 (ref 5–15)
BUN: 22 mg/dL — ABNORMAL HIGH (ref 6–20)
CO2: 26 mmol/L (ref 22–32)
Calcium: 10.2 mg/dL (ref 8.9–10.3)
Chloride: 97 mmol/L — ABNORMAL LOW (ref 98–111)
Creatinine, Ser: 0.97 mg/dL (ref 0.61–1.24)
GFR, Estimated: >60 mL/min (ref >60)
Glucose, Bld: 173 mg/dL — ABNORMAL HIGH (ref 70–99)
Potassium: 3.7 mmol/L (ref 3.5–5.1)
Sodium: 128 mmol/L — ABNORMAL LOW (ref 135–145)
Total Bilirubin: 0.4 mg/dL (ref 0.3–1.2)
Total Protein: >12 g/dL — ABNORMAL HIGH (ref 6.5–8.1)

## 2022-05-13 LAB — FOLATE: Folate: 9.2 ng/mL (ref >5.9)

## 2022-05-13 LAB — IRON AND TIBC
Iron: 124 ug/dL (ref 45–182)
Saturation Ratios: 82 % — ABNORMAL HIGH (ref 17.9–39.5)
TIBC: 152 ug/dL — ABNORMAL LOW (ref 250–450)
UIBC: 28 ug/dL

## 2022-05-13 LAB — LACTATE DEHYDROGENASE: LDH: 197 U/L — ABNORMAL HIGH (ref 98–192)

## 2022-05-13 LAB — HEMOGLOBIN AND HEMATOCRIT, BLOOD
HCT: 19.4 % — ABNORMAL LOW (ref 39.0–52.0)
Hemoglobin: 6 g/dL — CL (ref 13.0–17.0)

## 2022-05-13 LAB — RETICULOCYTES
Immature Retic Fract: 36.1 % — ABNORMAL HIGH (ref 2.3–15.9)
RBC.: 2.16 MIL/uL — ABNORMAL LOW (ref 4.22–5.81)
Retic Count, Absolute: 40.2 10*3/uL (ref 19.0–186.0)
Retic Ct Pct: 1.9 % (ref 0.4–3.1)

## 2022-05-13 LAB — HIV ANTIBODY (ROUTINE TESTING W REFLEX): HIV Screen 4th Generation wRfx: NONREACTIVE

## 2022-05-13 LAB — HEMOGLOBIN A1C
Hgb A1c MFr Bld: 8.2 % — ABNORMAL HIGH (ref 4.8–5.6)
Mean Plasma Glucose: 188.64 mg/dL

## 2022-05-13 LAB — POC OCCULT BLOOD, ED: Fecal Occult Bld: POSITIVE — AB

## 2022-05-13 LAB — VITAMIN B12: Vitamin B-12: 329 pg/mL (ref 180–914)

## 2022-05-13 LAB — PREPARE RBC (CROSSMATCH)

## 2022-05-13 MED ORDER — ACETAMINOPHEN 325 MG PO TABS
650.0000 mg | ORAL_TABLET | Freq: Four times a day (QID) | ORAL | Status: DC | PRN
  Administered 2022-05-13 – 2022-05-14 (×2): 650 mg via ORAL
  Filled 2022-05-13 (×3): qty 2

## 2022-05-13 MED ORDER — ONDANSETRON HCL 4 MG/2ML IJ SOLN: 4.0000 mg | Freq: Four times a day (QID) | INTRAMUSCULAR | Status: DC | PRN

## 2022-05-13 MED ORDER — ACETAMINOPHEN 650 MG RE SUPP: 650.0000 mg | Freq: Four times a day (QID) | RECTAL | Status: DC | PRN

## 2022-05-13 MED ORDER — ENSURE ENLIVE PO LIQD
237.0000 mL | Freq: Two times a day (BID) | ORAL | Status: DC
  Administered 2022-05-14 – 2022-05-16 (×4): 237 mL via ORAL

## 2022-05-13 MED ORDER — ONDANSETRON HCL 4 MG PO TABS: 4.0000 mg | ORAL_TABLET | Freq: Four times a day (QID) | ORAL | Status: DC | PRN

## 2022-05-13 MED ORDER — SODIUM CHLORIDE 0.9 % IV SOLN: Freq: Once | INTRAVENOUS | Status: AC

## 2022-05-13 MED ORDER — SODIUM CHLORIDE 0.9 % IV SOLN: 10.0000 mL/h | Freq: Once | INTRAVENOUS | Status: DC

## 2022-05-13 NOTE — ED Provider Notes (Signed)
?[place] DEPT ?Provider Note ? ? ?CSN: [id number] ?Arrival date & time: 05/13/22  0934 ? ?  ? ?History ? ?Chief Complaint  ?Patient presents with  ? Abnormal Lab  ? ? ?Donald Suarez is a 56 y.o. male. ? ?Patient complains of weakness and saw his primary care doctor and his hemoglobin was 6.7.  He does have a history of multiple myeloma but has not been getting treated for the last year and a half ? ?The history is provided by the patient and a relative. No language interpreter was used.  ?Weakness ?Severity:  Moderate ?Onset quality:  Sudden ?Timing:  Constant ?Progression:  Waxing and waning ?Chronicity:  Recurrent ?Context: not alcohol use   ?Relieved by:  Nothing ?Worsened by:  Nothing ?Ineffective treatments:  None tried ?Associated symptoms: no abdominal pain, no chest pain, no cough, no diarrhea, no frequency, no headaches and no seizures   ? ?  ? ?Home Medications ?Prior to Admission medications   ?Medication Sig Start Date End Date Taking? Authorizing Provider  ?ibuprofen (ADVIL) 200 MG tablet Take 200 mg by mouth 2 (two) times daily as needed (pain).   Yes [provider]  ?zolpidem (AMBIEN) 10 MG tablet Take 10 mg by mouth at bedtime as needed for sleep. 05/12/22  Yes [provider]  ?   ? ?Allergies    ?Patient has no known allergies.   ? ?Review of Systems   ?Review of Systems  ?Constitutional:  Negative for appetite change and fatigue.  ?HENT:  Negative for congestion, ear discharge and sinus pressure.   ?Eyes:  Negative for discharge.  ?Respiratory:  Negative for cough.   ?Cardiovascular:  Negative for chest pain.  ?Gastrointestinal:  Negative for abdominal pain and diarrhea.  ?Genitourinary:  Negative for frequency and hematuria.  ?Musculoskeletal:  Negative for back pain.  ?Skin:  Negative for rash.  ?Neurological:  Positive for weakness. Negative for seizures and headaches.  ?Psychiatric/Behavioral:  Negative for hallucinations.   ? ?Physical Exam ?Updated  Vital Signs ?BP (!) 146/90   Pulse 99   Temp 98.3 ?F (36.8 ?C) (Oral)   Resp 20   Ht 5' 6" (1.676 m)   Wt 81.6 kg   SpO2 94%   BMI 29.05 kg/m?  ?Physical Exam ?Vitals and nursing note reviewed.  ?Constitutional:   ?   Appearance: He is well-developed.  ?HENT:  ?   Head: Normocephalic.  ?   Nose: Nose normal.  ?Eyes:  ?   General: No scleral icterus. ?   Conjunctiva/sclera: Conjunctivae normal.  ?Neck:  ?   Thyroid: No thyromegaly.  ?Cardiovascular:  ?   Rate and Rhythm: Regular rhythm. Tachycardia present.  ?   Heart sounds: No murmur heard. ?  No friction rub. No gallop.  ?Pulmonary:  ?   Breath sounds: No stridor. No wheezing or rales.  ?Chest:  ?   Chest wall: No tenderness.  ?Abdominal:  ?   General: There is no distension.  ?   Tenderness: There is no abdominal tenderness. There is no rebound.  ?Genitourinary: ?   Comments: Heme positive brown stool ?Musculoskeletal:     ?   General: Normal range of motion.  ?   Cervical back: Neck supple.  ?Lymphadenopathy:  ?   Cervical: No cervical adenopathy.  ?Skin: ?   Findings: No erythema or rash.  ?Neurological:  ?   Mental Status: He is alert and oriented to person, place, and time.  ?   Motor: No  abnormal muscle tone.  ?   Coordination: Coordination normal.  ?Psychiatric:     ?   Behavior: Behavior normal.  ? ? ?ED Results / Procedures / Treatments   ?Labs ?(all labs ordered are listed, but only abnormal results are displayed) ?Labs Reviewed  ?CBC WITH DIFFERENTIAL/PLATELET - Abnormal; Notable for the following components:  ?    Result Value  ? RBC 2.14 (*)   ? Hemoglobin 6.7 (*)   ? HCT 19.9 (*)   ? RDW 23.2 (*)   ? Platelets 93 (*)   ? nRBC 29.5 (*)   ? Basophils Absolute 0.3 (*)   ? nRBC 23 (*)   ? Abs Immature Granulocytes 1.20 (*)   ? All other components within normal limits  ?COMPREHENSIVE METABOLIC PANEL - Abnormal; Notable for the following components:  ? Sodium 128 (*)   ? Chloride 97 (*)   ? Glucose, Bld 173 (*)   ? BUN 22 (*)   ? Total Protein  >12.0 (*)   ? Albumin 1.6 (*)   ? All other components within normal limits  ?IRON AND TIBC - Abnormal; Notable for the following components:  ? TIBC 152 (*)   ? Saturation Ratios 82 (*)   ? All other components within normal limits  ?FERRITIN - Abnormal; Notable for the following components:  ? Ferritin 774 (*)   ? All other components within normal limits  ?RETICULOCYTES - Abnormal; Notable for the following components:  ? RBC. 2.16 (*)   ? Immature Retic Fract 36.1 (*)   ? All other components within normal limits  ?POC OCCULT BLOOD, ED - Abnormal; Notable for the following components:  ? Fecal Occult Bld POSITIVE (*)   ? All other components within normal limits  ?VITAMIN B12  ?FOLATE  ?PATHOLOGIST SMEAR REVIEW  ?TYPE AND SCREEN  ?PREPARE RBC (CROSSMATCH)  ? ? ?EKG ?None ? ?Radiology ?DG Chest Port 1 View ? ?Result Date: 05/13/2022 ?CLINICAL DATA:  Weakness EXAM: PORTABLE CHEST 1 VIEW COMPARISON:  None Available. FINDINGS: The heart size and mediastinal contours are within normal limits. Slightly low lung volumes. No focal airspace consolidation, pleural effusion, or pneumothorax. The visualized skeletal structures are unremarkable. IMPRESSION: No active disease. Electronically Signed   By: Nicholas  Plundo D.O.   On: 05/13/2022 13:03   ? ?Procedures ?Procedures  ? ? ?Medications Ordered in ED ?Medications  ?0.9 %  sodium chloride infusion (has no administration in time range)  ? ? ?ED Course/ Medical Decision Making/ A&P ? ?CRITICAL CARE ?Performed by: Joseph Zammit ?Total critical care time: 35 minutes ?Critical care time was exclusive of separately billable procedures and treating other patients. ?Critical care was necessary to treat or prevent imminent or life-threatening deterioration. ?Critical care was time spent personally by me on the following activities: development of treatment plan with patient and/or surrogate as well as nursing, discussions with consultants, evaluation of patient's response to  treatment, examination of patient, obtaining history from patient or surrogate, ordering and performing treatments and interventions, ordering and review of laboratory studies, ordering and review of radiographic studies, pulse oximetry and re-evaluation of patient's condition. ? ? ? ?Patient with anemia and multiple myeloma.  I spoke with the oncologist he used to have.  I was told that he has not been there for a year and a half and has not been getting any care.  The patient supposedly does want care now so the hospitalist will admit him ?                        ?  Medical Decision Making ?Amount and/or Complexity of Data Reviewed ?Labs: ordered. ?Radiology: ordered. ?ECG/medicine tests: ordered. ? ?Risk ?Prescription drug management. ?Decision regarding hospitalization. ? ?This patient presents to the ED for concern of weakness, this involves an extensive number of treatment options, and is a complaint that carries with it a high risk of complications and morbidity.  The differential diagnosis includes anemia, worsening multiple myeloma ? ? ?Co morbidities that complicate the patient evaluation ? ?Multiple myeloma ? ? ?Additional history obtained: ? ?Additional history obtained from nephew ?External records from outside source obtained and reviewed including hospital record ? ? ?Lab Tests: ? ?I Ordered, and personally interpreted labs.  The pertinent results include: Hemoglobin which showed it to be low at 6.7, protein level greater than 12 ? ? ?Imaging Studies ordered: ? ?I ordered imaging studies including chest x-ray ?I independently visualized and interpreted imaging which showed no acute disease ?I agree with the radiologist interpretation ? ? ?Cardiac Monitoring: / EKG: ? ?The patient was maintained on a cardiac monitor.  I personally viewed and interpreted the cardiac monitored which showed an underlying rhythm of: Sinus tachycardia ? ? ?Consultations Obtained: ? ?I requested consultation with the  hematology oncology and hospital,  and discussed lab and imaging findings as well as pertinent plan - they recommend: Admit to the hospital and transfuse packed red blood cells with oncology consult at the hospital for his

## 2022-05-13 NOTE — ED Triage Notes (Signed)
Patient's nephew reports that the patient's doctor told him to come to the ED because he was anemic. ? ?

## 2022-05-13 NOTE — H&P (Signed)
?History and Physical  ? ? ?Patient: Donald Suarez MRN:[id number] DOB: 03/13/1966 ?DOA: 05/13/2022 ?DOS: the patient was seen and examined on 05/13/2022 ?PCP: Pcp, No  ?Patient coming from: Home ? ?Chief Complaint:  ?Chief Complaint  ?Patient presents with  ? Abnormal Lab  ? ?HPI: Donald Suarez is a 56 y.o. male with medical history significant of multiple myeloma. He has not followed up with oncology in over a year. Patient has limited ability to speak english. There is no objective interpreter for his dialect. Interpretation w/ nephew at bedside. He reports 1 week of generalized weakness and dizziness. It is worse with activity. He hasn't had any N/V/D or fevers. He hasn't had any sick contacts. He went to his PCP today and it was noted that he was anemic. So it was recommended that he come to the ED for evaluation.  ? ?Additionally, the patient reports poor appetite and bitter taste when he tries to eat. Again, no N/V. He otherwise denies any other aggravating or alleviating factors.   ? ?Review of Systems: As mentioned in the history of present illness. All other systems reviewed and are negative. ?Past Medical History:  ?Diagnosis Date  ? Anemia   ?Multiple Myeloma ? ?Past Surgical History:  ?Procedure Laterality Date  ? APPENDECTOMY    ? ?Social History:  reports that he has never smoked. He has never used smokeless tobacco. He reports that he does not drink alcohol and does not use drugs. ? ?No Known Allergies ? ?History reviewed. No pertinent family history. ? ?Prior to Admission medications   ?Medication Sig Start Date End Date Taking? Authorizing Provider  ?ibuprofen (ADVIL) 200 MG tablet Take 200 mg by mouth 2 (two) times daily as needed (pain).   Yes [provider]  ?zolpidem (AMBIEN) 10 MG tablet Take 10 mg by mouth at bedtime as needed for sleep. 05/12/22  Yes [provider]  ? ? ?Physical Exam: ?Vitals:  ? 05/13/22 1300 05/13/22 1330 05/13/22 1400 05/13/22 1430  ?BP: 111/76 (!) 151/92 111/68 (!)  146/90  ?Pulse: (!) 101 (!) 108 100 99  ?Resp: 12 (!) 32 (!) 37 20  ?Temp:      ?TempSrc:      ?SpO2: 99% 95% 95% 94%  ?Weight:      ?Height:      ? ?General: 56 y.o. male resting in bed in NAD ?Eyes: PERRL, normal sclera ?ENMT: Nares patent w/o discharge, orophaynx clear, dentition normal, ears w/o discharge/lesions/ulcers ?Neck: Supple, trachea midline ?Cardiovascular: tachy, +S1, S2, no m/g/r, equal pulses throughout ?Respiratory: CTABL, no w/r/r, normal WOB ?GI: BS+, NDNT, no masses noted, no organomegaly noted ?MSK: No e/c/c ?Neuro: A&O x 3, no focal deficits ?Psyc: Appropriate interaction and affect, calm/cooperative ? ?Data Reviewed: ? ?Na+ 128 ?Glucose  173 ?BUN 22 ?Scr  0.73 ?Albumin  1.6 ?Total protein > 12.0 ?WBC  10.3 ?Hgb  6.7 ? ?CXR: No active disease. ? ?EKG: sinus tach, no st elevations ? ?Assessment and Plan: ?Symptomatic anemia ?Anemia of chronic disease ?Thrombocytopenia ?    - place in obs, tele ?    - secondary to MM ?    - transfuse 1 unit pRBCs, follow up H&H ?    - irons studies as above ?    - denies hematuria, hematemesis, hematochezia ? ?Multiple Myeloma ?Hypoalbuminemia ?    - low albumin poor prognostic factor ?    - has not followed up with onco in well over a year ?    - spoke   with [facility], they will see him and set up follow up ? ?Hyponatremia ?    - chronicity unknown ?    - check urine osm, urine Na+ ?    - fluids ? ?Hyperglycemia ?    - no previous history of DM ?    - check A1c ? ?Advance Care Planning:   Code Status: Not on file  ? ?Consults: Oncology (Dr. Benay Spice) ? ?Family Communication: w/ family at bedside ? ?Severity of Illness: ?The appropriate patient status for this patient is OBSERVATION. Observation status is judged to be reasonable and necessary in order to provide the required intensity of service to ensure the patient's safety. The patient's presenting symptoms, physical exam findings, and initial radiographic and laboratory data in the context of their medical  condition is felt to place them at decreased risk for further clinical deterioration. Furthermore, it is anticipated that the patient will be medically stable for discharge from the hospital within 2 midnights of admission.  ? ?Author: ?Jonnie Finner, DO ?05/13/2022 2:35 PM ? ?For on call review [email or web address].  ?

## 2022-05-14 ENCOUNTER — Other Ambulatory Visit: Payer: Self-pay | Admitting: Hematology

## 2022-05-14 ENCOUNTER — Observation Stay (HOSPITAL_COMMUNITY): Payer: BC Managed Care – PPO

## 2022-05-14 DIAGNOSIS — D630 Anemia in neoplastic disease: Secondary | ICD-10-CM | POA: Diagnosis present

## 2022-05-14 DIAGNOSIS — D6959 Other secondary thrombocytopenia: Secondary | ICD-10-CM | POA: Diagnosis present

## 2022-05-14 DIAGNOSIS — Z5112 Encounter for antineoplastic immunotherapy: Secondary | ICD-10-CM | POA: Diagnosis not present

## 2022-05-14 DIAGNOSIS — H538 Other visual disturbances: Secondary | ICD-10-CM | POA: Diagnosis not present

## 2022-05-14 DIAGNOSIS — E119 Type 2 diabetes mellitus without complications: Secondary | ICD-10-CM | POA: Diagnosis not present

## 2022-05-14 DIAGNOSIS — C9000 Multiple myeloma not having achieved remission: Secondary | ICD-10-CM | POA: Diagnosis present

## 2022-05-14 DIAGNOSIS — Z794 Long term (current) use of insulin: Secondary | ICD-10-CM | POA: Diagnosis not present

## 2022-05-14 DIAGNOSIS — E871 Hypo-osmolality and hyponatremia: Secondary | ICD-10-CM | POA: Diagnosis present

## 2022-05-14 DIAGNOSIS — E663 Overweight: Secondary | ICD-10-CM | POA: Diagnosis present

## 2022-05-14 DIAGNOSIS — D649 Anemia, unspecified: Secondary | ICD-10-CM | POA: Diagnosis present

## 2022-05-14 DIAGNOSIS — Z9221 Personal history of antineoplastic chemotherapy: Secondary | ICD-10-CM | POA: Diagnosis not present

## 2022-05-14 DIAGNOSIS — D696 Thrombocytopenia, unspecified: Secondary | ICD-10-CM | POA: Diagnosis not present

## 2022-05-14 DIAGNOSIS — E1165 Type 2 diabetes mellitus with hyperglycemia: Secondary | ICD-10-CM | POA: Diagnosis present

## 2022-05-14 DIAGNOSIS — D759 Disease of blood and blood-forming organs, unspecified: Secondary | ICD-10-CM | POA: Diagnosis not present

## 2022-05-14 DIAGNOSIS — E8809 Other disorders of plasma-protein metabolism, not elsewhere classified: Secondary | ICD-10-CM | POA: Diagnosis present

## 2022-05-14 DIAGNOSIS — Z6829 Body mass index (BMI) 29.0-29.9, adult: Secondary | ICD-10-CM | POA: Diagnosis not present

## 2022-05-14 DIAGNOSIS — D751 Secondary polycythemia: Secondary | ICD-10-CM | POA: Diagnosis not present

## 2022-05-14 DIAGNOSIS — Z91199 Patient's noncompliance with other medical treatment and regimen due to unspecified reason: Secondary | ICD-10-CM | POA: Diagnosis not present

## 2022-05-14 LAB — CBC
HCT: 21.4 % — ABNORMAL LOW (ref 39.0–52.0)
Hemoglobin: 6.7 g/dL — CL (ref 13.0–17.0)
MCH: 28.8 pg (ref 26.0–34.0)
MCHC: 31.3 g/dL (ref 30.0–36.0)
MCV: 91.8 fL (ref 80.0–100.0)
Platelets: 78 10*3/uL — ABNORMAL LOW (ref 150–400)
RBC: 2.33 MIL/uL — ABNORMAL LOW (ref 4.22–5.81)
RDW: 21.1 % — ABNORMAL HIGH (ref 11.5–15.5)
WBC: 8.9 10*3/uL (ref 4.0–10.5)
nRBC: 34 % — ABNORMAL HIGH (ref 0.0–0.2)

## 2022-05-14 LAB — COMPREHENSIVE METABOLIC PANEL
ALT: 41 U/L (ref 0–44)
AST: 30 U/L (ref 15–41)
Albumin: 1.5 g/dL — ABNORMAL LOW (ref 3.5–5.0)
Alkaline Phosphatase: 43 U/L (ref 38–126)
Anion gap: 7 (ref 5–15)
BUN: 22 mg/dL — ABNORMAL HIGH (ref 6–20)
CO2: 23 mmol/L (ref 22–32)
Calcium: 9.6 mg/dL (ref 8.9–10.3)
Chloride: 103 mmol/L (ref 98–111)
Creatinine, Ser: 0.9 mg/dL (ref 0.61–1.24)
GFR, Estimated: >60 mL/min (ref >60)
Glucose, Bld: 160 mg/dL — ABNORMAL HIGH (ref 70–99)
Potassium: 4.4 mmol/L (ref 3.5–5.1)
Sodium: 133 mmol/L — ABNORMAL LOW (ref 135–145)
Total Bilirubin: 0.5 mg/dL (ref 0.3–1.2)
Total Protein: >12 g/dL — ABNORMAL HIGH (ref 6.5–8.1)

## 2022-05-14 LAB — HEMOGLOBIN AND HEMATOCRIT, BLOOD
HCT: 21.9 % — ABNORMAL LOW (ref 39.0–52.0)
Hemoglobin: 7.1 g/dL — ABNORMAL LOW (ref 13.0–17.0)

## 2022-05-14 LAB — BETA 2 MICROGLOBULIN, SERUM: Beta-2 Microglobulin: 4.4 mg/L — ABNORMAL HIGH (ref 0.6–2.4)

## 2022-05-14 LAB — KAPPA/LAMBDA LIGHT CHAINS
Kappa free light chain: 6.2 mg/L (ref 3.3–19.4)
Kappa, lambda light chain ratio: 0.08 — ABNORMAL LOW (ref 0.26–1.65)
Lambda free light chains: 77.1 mg/L — ABNORMAL HIGH (ref 5.7–26.3)

## 2022-05-14 LAB — PREPARE RBC (CROSSMATCH)

## 2022-05-14 LAB — PHOSPHORUS: Phosphorus: 3.7 mg/dL (ref 2.5–4.6)

## 2022-05-14 LAB — ABO/RH: ABO/RH(D): A POS

## 2022-05-14 MED ORDER — SODIUM CHLORIDE 0.9% IV SOLUTION: Freq: Once | INTRAVENOUS | Status: AC

## 2022-05-14 MED ORDER — PANTOPRAZOLE SODIUM 40 MG PO TBEC
40.0000 mg | DELAYED_RELEASE_TABLET | Freq: Every day | ORAL | Status: DC
  Administered 2022-05-14 – 2022-05-20 (×7): 40 mg via ORAL
  Filled 2022-05-14 (×8): qty 1

## 2022-05-14 MED ORDER — DEXAMETHASONE 6 MG PO TABS
40.0000 mg | ORAL_TABLET | Freq: Every day | ORAL | Status: AC
  Administered 2022-05-14 – 2022-05-17 (×4): 40 mg via ORAL
  Filled 2022-05-14: qty 1
  Filled 2022-05-14: qty 2
  Filled 2022-05-14: qty 1
  Filled 2022-05-14: qty 2
  Filled 2022-05-14: qty 10
  Filled 2022-05-14: qty 2

## 2022-05-14 NOTE — Progress Notes (Incomplete)
? ? ?HEMATOLOGY/ONCOLOGY INPATIENT NOTE ? ?Date of Service: 05/14/2022 ? ?Patient Care Team: ?Pcp, No as PCP - General ? ?CHIEF COMPLAINTS/PURPOSE OF CONSULTATION:  ?Evaluation and management of multiple myeloma ? ?HISTORY OF PRESENTING ILLNESS:  ?Please see previous note for details on initial presentation ?  ?INTERVAL HISTORY: ?Donald Suarez is a wonderful 56 y.o.male who is here for evaluation and management of Multiple Myeloma. The patient's last visit with [name] was on 01/01/2021. The pt reports that he is doing well overall. ? ?He notes that he has difficulties with transportation. We discussed that we can send transportation in addition to utilizing a Education officer, museum for other aid and resources. ? ?He reports fatigue that started within the last 2 weeks. ? ?He notes a loss of appetite that started within the last 2 weeks. ? ?He notes recent headaches with blurring of vision. He further reports some dizziness. ? ?He notes that he has lost some weight recently. ? ?We discussed the urgency for immediate treatment of his MM and that he will likely need blood transfusions until the cancer has been knocked back. We further discussed the necessity for a full body PET/CT scan to look for any new bone tumors which he was agreeable to. ? ?We discussed at least 2-3 L of water per day to keep kidneys flushed. We further discussed that he may even need to be put on plasmaphoresis if there are any signs of hyperviscosity syndrome for further help flushing out the abnormal protein. ? ?He currently works at [employer] and does not currently have disability insurance. We discussed that a Education officer, museum can help him apply for Medicaid. He will need to aply for short term disability and if he is not able to get that then he will need to apply for social security disability. We then discussed that he will need to stop working for the next few months until his MM has stabilized. ? ?We discussed starting Dexamethasone which he was agreeable  to. ? ?No confusion. ?No new bone pains. ?No other new or acute focal symptoms. ? ?Lab results today (05/14/22) of CBC w/diff and CMP -reviewed with patient. ?CBC shows symptomatic anemia with RBC count of 2.33, decreased Hgb of 6.7, thrombocytopenia with platelets of 78k and AIG elevated at 1.20. ?CMP shows elevated total protein of 12.0 and decreased albumin of 1.5 ?Phosphorous is at 3.7  ? ?MEDICAL HISTORY:  ?None ? ?SURGICAL HISTORY: ?Appendectomy in 2002 ? ?SOCIAL HISTORY: ?Social History  ? ?Socioeconomic History  ? Marital status: Married  ?  Spouse name: Not on file  ? Number of children: Not on file  ? Years of education: Not on file  ? Highest education level: Not on file  ?Occupational History  ? Not on file  ?Tobacco Use  ? Smoking status: Never  ? Smokeless tobacco: Never  ?Vaping Use  ? Vaping Use: Never used  ?Substance and Sexual Activity  ? Alcohol use: Never  ? Drug use: Never  ? Sexual activity: Not on file  ?Other Topics Concern  ? Not on file  ?Social History Narrative  ? Not on file  ? ?Social Determinants of Health  ? ?Financial Resource Strain: Not on file  ?Food Insecurity: Not on file  ?Transportation Needs: Not on file  ?Physical Activity: Not on file  ?Stress: Not on file  ?Social Connections: Not on file  ?Intimate Partner Violence: Not on file  ? ? ?FAMILY HISTORY: ?History reviewed. No pertinent family history. ? ?ALLERGIES:  The pt  has no medication allergies. ? ?MEDICATIONS:  ?Current Facility-Administered Medications  ?Medication Dose Route Frequency Provider Last Rate Last Admin  ? acetaminophen (TYLENOL) tablet 650 mg  650 mg Oral Q6H PRN Marylyn Ishihara, Tyrone A, DO   650 mg at 05/14/22 0750  ? Or  ? acetaminophen (TYLENOL) suppository 650 mg  650 mg Rectal Q6H PRN Marylyn Ishihara, Tyrone A, DO      ? feeding supplement (ENSURE ENLIVE / ENSURE PLUS) liquid 237 mL  237 mL Oral BID BM Kyle, Tyrone A, DO   237 mL at 05/14/22 0934  ? ondansetron (ZOFRAN) tablet 4 mg  4 mg Oral Q6H PRN Cherylann Ratel A, DO       ? Or  ? ondansetron (ZOFRAN) injection 4 mg  4 mg Intravenous Q6H PRN Marylyn Ishihara, Tyrone A, DO      ? ? ?REVIEW OF SYSTEMS:   ?A 10+ POINT REVIEW OF SYSTEMS WAS OBTAINED including neurology, dermatology, psychiatry, cardiac, respiratory, lymph, extremities, GI, GU, Musculoskeletal, constitutional, breasts, reproductive, HEENT.  All pertinent positives are noted in the HPI.  All others are negative.  ? ?PHYSICAL EXAMINATION: ?ECOG PERFORMANCE STATUS: 1 - Symptomatic but completely ambulatory ? ?. ?Vitals:  ? 05/14/22 0747 05/14/22 0814  ?BP: (!) 149/92 (!) 148/82  ?Pulse: 97 100  ?Resp: 20 20  ?Temp: 98.5 ?F (36.9 ?C) 98.3 ?F (36.8 ?C)  ?SpO2: 95%   ? ?Filed Weights  ? 05/13/22 0944  ?Weight: 180 lb (81.6 kg)  ? ?.Body mass index is 29.05 kg/m?. ?NAD*** ?GENERAL:alert, in no acute distress and comfortable ?SKIN: no acute rashes, no significant lesions ?EYES: conjunctiva are pink and non-injected, sclera anicteric ?NECK: supple, no JVD ?LYMPH:  no palpable lymphadenopathy in the cervical, axillary or inguinal regions ?LUNGS: clear to auscultation b/l with normal respiratory effort ?HEART: regular rate & rhythm ?ABDOMEN:  normoactive bowel sounds , non tender, not distended. ?Extremity: no pedal edema ?PSYCH: alert & oriented x 3 with fluent speech ?NEURO: no focal motor/sensory deficits ? ?LABORATORY DATA:  ?I have reviewed the data as listed ? ?. ? ?  Latest Ref Rng & Units 05/14/2022  ?  4:59 AM 05/13/2022  ?  4:39 PM 05/13/2022  ? 11:30 AM  ?CBC  ?WBC 4.0 - 10.5 K/uL 8.9    10.3    ?Hemoglobin 13.0 - 17.0 g/dL 6.7   6.0   6.7    ?Hematocrit 39.0 - 52.0 % 21.4   19.4   19.9    ?Platelets 150 - 400 K/uL 78    93    ? ?ANC 600 ? ?. ? ?  Latest Ref Rng & Units 05/14/2022  ?  4:59 AM 05/13/2022  ?  6:36 PM 05/13/2022  ? 11:30 AM  ?CMP  ?Glucose 70 - 99 mg/dL 160   183   173    ?BUN 6 - 20 mg/dL 22   22   22     ?Creatinine 0.61 - 1.24 mg/dL 0.90   1.02   0.97    ?Sodium 135 - 145 mmol/L 133   129   128    ?Potassium 3.5 - 5.1  mmol/L 4.4   3.8   3.7    ?Chloride 98 - 111 mmol/L 103   99   97    ?CO2 22 - 32 mmol/L 23   23   26     ?Calcium 8.9 - 10.3 mg/dL 9.6   10.2   10.2    ?Total Protein 6.5 - 8.1 g/dL >12.0    >  12.0    ?Total Bilirubin 0.3 - 1.2 mg/dL 0.5    0.4    ?Alkaline Phos 38 - 126 U/L 43    43    ?AST 15 - 41 U/L 30    29    ?ALT 0 - 44 U/L 41    42    ? ? ? ?RADIOGRAPHIC STUDIES: ?I have personally reviewed the radiological images as listed and agreed with the findings in the report. ?DG Chest Port 1 View ? ?Result Date: 05/13/2022 ?CLINICAL DATA:  Weakness EXAM: PORTABLE CHEST 1 VIEW COMPARISON:  None Available. FINDINGS: The heart size and mediastinal contours are within normal limits. Slightly low lung volumes. No focal airspace consolidation, pleural effusion, or pneumothorax. The visualized skeletal structures are unremarkable. IMPRESSION: No active disease. Electronically Signed   By: Davina Poke D.O.   On: 05/13/2022 13:03   ? ?12/26/2020 [facility] MRI Brain: ? ? ?12/26/2020 [facility] PET/CT: ? ? ?ASSESSMENT & PLAN:  ? ?43 yo with  ? ?1) Newly diagnosed IgG Lambda Multiple myeloma with ?-anemia ?-renal insufficiency ?-no overt bone lesions ?Bx- 90% plasma cells ?Initial M spike 8.03g/dl ?FISH--pending from outside labs ? ?2) s/p- Hyperviscosity syndrome requiring plasmapheresis. ? ?3) neutropenia related to chemotherapy -- primarily cytoxan ? ?PLAN: ?-Pt's primary contact is Blui Romah ?[phone] (son in law) ?-labs reviewed with patient. ?-continue Velcade at current dose ?-continue baby ASA. ?-Meet with Social work to discuss applying for social security disability benefits. ?-He was advised that if he experiences confusion or SOB following discharge from ED to return immediately.*** ?-Start Dexamethasone. ?-We discussed the urgency for immediate treatment of his MM and that he will likely need blood transfusions until the cancer has stabilized. ?-Schedule full body PET/CT scan. ?-We discussed at least 2-3 L of water  per day to keep kidneys flushed. We further discussed that he may even need to be put on plasmaphoresis if there are any signs of hyperviscosity syndrome for further help flushing out the abnormal protein. ?-He

## 2022-05-14 NOTE — Progress Notes (Signed)
?PROGRESS NOTE ? ? ? ?Donald Suarez  KVQ:[id number] DOB: 27-Jun-1966 DOA: 05/13/2022 ?PCP: Pcp, No  ? ?Brief Narrative: 56 year old male with history of multiple myeloma lost to follow-up.  He was admitted to the hospital with fatigue and dizziness he also complained of headache and some blurring of vision.  He was noted to have hemoglobin of 6 on admission with a platelets of 78,000.  Patient was transfused 2 units.  Patient seen by oncology Dr Irene Limbo concern for hyperviscosity and needing plasmapheresis.  Patient will be transferred to [facility] for the same.  Nephrology and interventional radiology consulted.  Jalan for Port-A-Cath placement. ? ? ?Assessment & Plan: ?  ?Principal Problem: ?  Symptomatic anemia ?Active Problems: ?  Multiple myeloma not having achieved remission ([place]) ?  Hyponatremia ?  Hyperglycemia ?  Thrombocytopenia ([place]) ?  Hypoalbuminemia ? ? ?#1 multiple myeloma-patient was lost to follow-up.  He had admission to [facility] for hyperviscosity and had plasmapheresis done a year ago.  Seen by Dr. Irene Limbo recommending plasmapheresis, patient being transferred to [facility].  Nephrology consulted. ?Leemon consulted for Port-A-Cath placement and HD catheter placement. ?Total protein more than 12 ?Concern for severe hyperviscosity and complications especially after receiving blood transfusion. ?Patient was seen today with his nephew in the room.  Encouraged to drink fluids. ?Hyperviscosity labs pending. ?Oncology planning outpatient chemo ?Outpatient PET/CT ? ?#2 symptomatic anemia received 2 units of blood transfusion ?Hemoglobin improved to 7.1 after transfusion. ?Blood transfusion for hemoglobin less than 7 or if symptomatic ? ?#3 hypercalcemia with corrected calcium 11.6 due to multiple myeloma oncology recommends bisphosphonate as an outpatient ? ?#4 mild hyponatremia sodium 133 and improving follow ? ?#5 severe hypoalbuminemia albumin 1.5 ? ?#6 thrombocytopenia platelets 78 monitor daily ? ? ? ?Estimated body mass index is 29.05  kg/m? as calculated from the following: ?  Height as of this encounter: 5' 6"  (1.676 m). ?  Weight as of this encounter: 81.6 kg. ? ?DVT prophylaxis: None due to thrombocytopenia  ?code Status: Full code ?Family Communication: Discussed with nephew ?Disposition Plan:  Status is: Inpatient ?  ?Consultants:  ?Oncology nephrology interventional radiology ? ?Procedures: None ?Antimicrobials: None ? ?Subjective: ?Patient is resting in bed in no acute distress nephew by the bedside he reports headaches denies shortness of breath or chest pain ?40 minutes ? ?Objective: ?Vitals:  ? 05/14/22 0335 05/14/22 0747 05/14/22 0814 05/14/22 1021  ?BP: 134/87 (!) 149/92 (!) 148/82 132/89  ?Pulse: 98 97 100 (!) 102  ?Resp: 18 20 20 20   ?Temp: 98.3 ?F (36.8 ?C) 98.5 ?F (36.9 ?C) 98.3 ?F (36.8 ?C) 98.6 ?F (37 ?C)  ?TempSrc: Oral Oral  Oral  ?SpO2: 95% 95%  99%  ?Weight:      ?Height:      ? ? ?Intake/Output Summary (Last 24 hours) at 05/14/2022 1513 ?Last data filed at 05/14/2022 1021 ?Gross per 24 hour  ?Intake 1155.33 ml  ?Output --  ?Net 1155.33 ml  ? ?Filed Weights  ? 05/13/22 0944  ?Weight: 81.6 kg  ? ? ?Examination: ? ?General exam: Appears calm and comfortable  ?Respiratory system: Clear to auscultation. Respiratory effort normal. ?Cardiovascular system: S1 & S2 heard, RRR. No JVD, murmurs, rubs, gallops or clicks. No pedal edema. ?Gastrointestinal system: Abdomen is nondistended, soft and nontender. No organomegaly or masses felt. Normal bowel sounds heard. ?Central nervous system: Alert and oriented. No focal neurological deficits. ?Extremities: Symmetric 5 x 5 power. ?Skin: No rashes, lesions or ulcers ?Psychiatry: Judgement and insight appear normal. Mood &  affect appropriate.  ? ? ? ?Data Reviewed: I have personally reviewed following labs and imaging studies ? ?CBC: ?Recent Labs  ?Lab 05/13/22 ?1130 05/13/22 ?1639 05/14/22 ?0459 05/14/22 ?1315  ?WBC 10.3  --  8.9  --   ?NEUTROABS 4.1  --   --   --   ?HGB 6.7* 6.0* 6.7* 7.1*   ?HCT 19.9* 19.4* 21.4* 21.9*  ?MCV 93.0  --  91.8  --   ?PLT 93*  --  78*  --   ? ?Basic Metabolic Panel: ?Recent Labs  ?Lab 05/13/22 ?1130 05/13/22 ?1836 05/14/22 ?0459  ?NA 128* 129* 133*  ?K 3.7 3.8 4.4  ?CL 97* 99 103  ?CO2 26 23 23   ?GLUCOSE 173* 183* 160*  ?BUN 22* 22* 22*  ?CREATININE 0.97 1.02 0.90  ?CALCIUM 10.2 10.2 9.6  ?PHOS  --  5.5* 3.7  ? ?GFR: ?Estimated Creatinine Clearance: 93 mL/min (by C-G formula based on SCr of 0.9 mg/dL). ?Liver Function Tests: ?Recent Labs  ?Lab 05/13/22 ?1130 05/13/22 ?1836 05/14/22 ?0459  ?AST 29  --  30  ?ALT 42  --  41  ?ALKPHOS 43  --  43  ?BILITOT 0.4  --  0.5  ?PROT >12.0*  --  >12.0*  ?ALBUMIN 1.6* 1.6* 1.5*  ? ?No results for input(s): LIPASE, AMYLASE in the last 168 hours. ?No results for input(s): AMMONIA in the last 168 hours. ?Coagulation Profile: ?No results for input(s): INR, PROTIME in the last 168 hours. ?Cardiac Enzymes: ?No results for input(s): CKTOTAL, CKMB, CKMBINDEX, TROPONINI in the last 168 hours. ?BNP (last 3 results) ?No results for input(s): PROBNP in the last 8760 hours. ?HbA1C: ?Recent Labs  ?  05/13/22 ?1639  ?HGBA1C 8.2*  ? ?CBG: ?No results for input(s): GLUCAP in the last 168 hours. ?Lipid Profile: ?No results for input(s): CHOL, HDL, LDLCALC, TRIG, CHOLHDL, LDLDIRECT in the last 72 hours. ?Thyroid Function Tests: ?No results for input(s): TSH, T4TOTAL, FREET4, T3FREE, THYROIDAB in the last 72 hours. ?Anemia Panel: ?Recent Labs  ?  05/13/22 ?1130  ?VITAMINB12 329  ?FOLATE 9.2  ?FERRITIN 774*  ?TIBC 152*  ?IRON 124  ?RETICCTPCT 1.9  ? ?Sepsis Labs: ?No results for input(s): PROCALCITON, LATICACIDVEN in the last 168 hours. ? ?No results found for this or any previous visit (from the past 240 hour(s)).  ? ? ? ? ? ?Radiology Studies: ?DG Chest Port 1 View ? ?Result Date: 05/13/2022 ?CLINICAL DATA:  Weakness EXAM: PORTABLE CHEST 1 VIEW COMPARISON:  None Available. FINDINGS: The heart size and mediastinal contours are within normal limits. Slightly  low lung volumes. No focal airspace consolidation, pleural effusion, or pneumothorax. The visualized skeletal structures are unremarkable. IMPRESSION: No active disease. Electronically Signed   By: Davina Poke D.O.   On: 05/13/2022 13:03   ? ? ? ? ? ?Scheduled Meds: ? dexamethasone  40 mg Oral Daily  ? feeding supplement  237 mL Oral BID BM  ? pantoprazole  40 mg Oral Daily  ? ?Continuous Infusions: ? ? LOS: 0 days  ? ? ?Time spent: 40 min ? ? ? ?Georgette Shell, MD ? ?05/14/2022, 3:13 PM  ? ?

## 2022-05-14 NOTE — Consult Note (Signed)
Renal Service ?Consult Note ?[facility] ? ?Kele Holway ?05/14/2022 ?Sol Blazing, MD ?Requesting Physician: Dr. Rodena Piety ? ?Reason for Consult: Myeloma patient might need plasmapheresis for hyperviscosity ?HPI: The patient is a 56 y.o. year-old w/ hx of multiple myeloma presented to ED. Does not speak english. Reported 1 wk of gen'd weakness and dizziness, worse w/ activity. No n/v/d or fevers. At PCP's he was noted to be anemic. Sent to ED.  In ED Hb is 6 and FOB was +. Pt admitted for symptomatic anemia. Has not seen hem-onc for a year. We are asked to see the patient to arrange for plasmapheresis for suspected hyperviscosity syndrome. ? ?Pt seen in room. Pt is w/o complaints, no SOB, CP or fever, no n/v/d. No voiding issues.  ? ?ROS - denies CP, no joint pain, no HA, no blurry vision, no rash, no diarrhea, no nausea/ vomiting, no dysuria, no difficulty voiding ? ? ?Past Medical History  ?Past Medical History:  ?Diagnosis Date  ? Anemia   ? ?Past Surgical History  ?Past Surgical History:  ?Procedure Laterality Date  ? APPENDECTOMY    ? ?Family History History reviewed. No pertinent family history. ?Social History  reports that he has never smoked. He has never used smokeless tobacco. He reports that he does not drink alcohol and does not use drugs. ?Allergies No Known Allergies ?Home medications ?Prior to Admission medications   ?Medication Sig Start Date End Date Taking? Authorizing Provider  ?ibuprofen (ADVIL) 200 MG tablet Take 200 mg by mouth 2 (two) times daily as needed (pain).   Yes [provider]  ?zolpidem (AMBIEN) 10 MG tablet Take 10 mg by mouth at bedtime as needed for sleep. 05/12/22  Yes [provider]  ? ? ? ?Vitals:  ? 05/14/22 0335 05/14/22 0747 05/14/22 0814 05/14/22 1021  ?BP: 134/87 (!) 149/92 (!) 148/82 132/89  ?Pulse: 98 97 100 (!) 102  ?Resp: 18 20 20 20   ?Temp: 98.3 ?F (36.8 ?C) 98.5 ?F (36.9 ?C) 98.3 ?F (36.8 ?C) 98.6 ?F (37 ?C)  ?TempSrc: Oral Oral  Oral   ?SpO2: 95% 95%  99%  ?Weight:      ?Height:      ? ?Exam ?Gen alert, no distress ?No rash, cyanosis or gangrene ?Sclera anicteric, throat clear  ?No jvd or bruits ?Chest clear bilat to bases, no rales/ wheezing ?RRR no MRG ?Abd soft ntnd no mass or ascites +bs ?GU normal ?MS no joint effusions or deformity ?Ext no LE or UE edema, no wounds or ulcers ?Neuro is alert, Ox 3 , nf ?   ? ? ? Home meds include - advil, ambien prn ? ?  Na 128  K 3.7  CO2 26  BUN 22  Creat 0.97  Ca 10.2  Alb 1.6  LFT"s ok ?  WBC 10k  Hb 6.7  FOB + ? ? ? ?Assessment/ Plan: ?Uncontrolled multiple myeloma/ suspected hyperviscosity syndrome - has not been following up w/ his doctors for his myeloma. Oncology is concerned he may have hyperviscosity syndrome (^^total protein, dizziness, etc). He has had plasmapheresis at [facility] in the past for hyperviscosity syndrome prior to getting lost to follow up. Asked to see to arrange for pheresis. Consulted [facility] for temp HD cath and will need to transfer to [facility]. Plan 3- 5 pheresis sessions given qod, inpatient to start, may transition to OP (Oncology team will decide).  ?Anemia - getting prbcs per pmd ?Multiple myeloma - w/ low albumin, normal renal function ?Hyponatremia -  mild, urine studies sent  ?  ? ? ? ?Kelly Splinter  MD ?05/14/2022, 2:39 PM ?Recent Labs  ?Lab 05/13/22 ?1836 05/14/22 ?0459 05/14/22 ?1315  ?HGB  --  6.7* 7.1*  ?ALBUMIN 1.6* 1.5*  --   ?CALCIUM 10.2 9.6  --   ?PHOS 5.5* 3.7  --   ?CREATININE 1.02 0.90  --   ?K 3.8 4.4  --   ? ? ?

## 2022-05-14 NOTE — Progress Notes (Signed)
DISCONTINUE ON PATHWAY REGIMEN - Multiple Myeloma and Other Plasma Cell Dyscrasias ? ? ?  A cycle is every 21 days: ?    Dexamethasone  ?    Bortezomib  ?    Cyclophosphamide  ? ?**Always confirm dose/schedule in your pharmacy ordering system** ? ?REASON: Other Reason ?PRIOR TREATMENT: [id number]: CyBord (Cyclophosphamide 500 mg/m2 IV D1, 8 + Bortezomib 1.3 mg/m2 SUBQ D1, 4, 8, 11 + Dexamethasone 40 mg PO D1, 8, 15) q21 Days x 4 Cycles ?TREATMENT RESPONSE: Unable to Evaluate ? ?START ON PATHWAY REGIMEN - Multiple Myeloma and Other Plasma Cell Dyscrasias ? ? ?DaraVRd (Daratumumab IV + Bortezomib SUBQ + Lenalidomide PO + Dexamethasone IV/PO) q21 Days (Induction Schema): ?  A cycle is every 21 days: ?    Lenalidomide  ?    Dexamethasone  ?    Bortezomib  ?    Daratumumab  ? ? ?DaraVRd (Daratumumab IV + Bortezomib SUBQ + Lenalidomide PO + Dexamethasone IV/PO) q21 Days (Consolidation Schema): ?  A cycle is every 21 days: ?    Lenalidomide  ?    Dexamethasone  ?    Bortezomib  ?    Daratumumab  ? ?**Always confirm dose/schedule in your pharmacy ordering system** ? ?Patient Characteristics: ?Multiple Myeloma, Newly Diagnosed, Transplant Eligible, High Risk ?Disease Classification: Multiple Myeloma ?R-ISS Staging: III ?Therapeutic Status: Newly Diagnosed ?Is Patient Eligible for Transplant<= Transplant Eligible ?Risk Status: High Risk ?Intent of Therapy: ?Non-Curative / Palliative Intent, Discussed with Patient ?

## 2022-05-14 NOTE — Progress Notes (Signed)
Date and time results received: 05/14/22  ?(use smartphrase ".now" to insert current time) ? ?Test: Hgb ?Critical Value: 6.7 ? ?Name of Provider Notified: Daniels,James NP ? ?Orders Received? Or Actions Taken?: Responded, awaiting next steps. ?

## 2022-05-14 NOTE — Progress Notes (Signed)
Chaplain assisted Donald Suarez to fill out his HCPOA and Advance Directives with the assistance of his nephew.  Chaplain will return tomorrow to help notarize. ? ?Donald Suarez, Bcc ?Pager, [phone] ?

## 2022-05-14 NOTE — Progress Notes (Signed)
Chaplain engaged in an initial visit with Donald Suarez and a family member in the room.  Chaplain knows that an interpreter is needed but family member stated that he wanted to wait for his brother because there would be more clarity and understanding with the language barrier.  Chaplain will follow-up later today to see if any other family becomes present to go over Advanced Directive.  ? ? ? 05/14/22 1000  ?Clinical Encounter Type  ?Visited With Patient and family together  ?Visit Type Initial;Social support  ?Spiritual Encounters  ?Spiritual Needs Literature;Brochure  ? ? ?

## 2022-05-14 NOTE — Progress Notes (Addendum)
Hematology oncology Short note ? ?Patient with history of multiple myeloma who had previously presented to [facility] with with hyperviscosity syndrome requiring plasmapheresis. ?Noted to high risk multiple myeloma with translocation 414 and a bone marrow biopsy on 12/25/2020 noting 90% involvement by lambda restricted plasma cells. ? ?Patient was treated with 1 cycle of CyBorD and decided to forego further treatment and was lost to follow-up.  He was called multiple times and his family members were called as well and the patient refused to follow-up at the time. ? ?He is now admitted to the hospital with 2 weeks of worsening fatigue and dizziness.  Does not report any confusion.  Mild headaches and some blurring of vision.  No focal bone pains. ? ?Noted to have significant symptomatic anemia with hemoglobin of 6 requiring PRBC transfusion.  Platelets are 78k  ? ?Total protein of more than 12 with an albumin of 1.5 suggesting significant hyer creatinine is within normal limits at 0.9. ?Calcium level is 9.6 but his corrected calcium is elevated at 11.6 mg/dL. ? ?Assessment ?-High risk multiple myeloma with significant progression as a result of patient declining previous treatments. ?-Symptomatic anemia due to myeloma ?-Hypercalcemia due to multiple myeloma ?-High risk of hyperviscosity given very elevated gammaglobulin levels.  ?PLAN ?-I discussed the current situation with the patient and his son-in-law's in detail at bedside with the help of a [place] interpreter. ?-We discussed that this is a very concerning situation since the patient has had myeloma progression over the last year since he was lost to follow-up.  He notes he had issues with insurance but we discussed in detail that that would not be a reason to not follow-up for treatment.  We discussed that the social worker and the [place] could have addressed his insurance issues and medical transportation issues as well.  Patient notes he is  apologetic and is inclined to pursue treatment probably this time around. ?-Patient will need outpatient PET CT scan but will do a whole body bone survey while in the hospital since we cannot do an inpatient PET/CT. ?-We will start the patient on dexamethasone 40 mg p.o. daily for 4 doses with PPI for GI prophylaxis. ?-Will likely need Port-A-Cath placement with recommend consulting [place]. ?-Nephrology consultation to determine need for plasmapheresis. ?-PRBC transfusion for hgb<7 or if symptomatic.  ?-We will be setting him up for daratumumab Velcade Revlimid dexamethasone treatment for high risk myeloma starting within the next week. ?-We will continue to follow ?-Social worker consultation to address barriers to treatment including medical transportation needs insurance issues and disability Chief Strategy Officer and recommendations. ?-Full consult to follow ? ?Sullivan Lone MD MS ? ?Addendum ?Connected with Dr. Melvia Heaps nephrology for consideration of plasmapheresis for hyperviscosity concerns. ?Serum hyperviscosity lab order pending ?Patient high risk for hyperviscosity syndrome with correction of anemia in addition to his severe hypergammaglobinemia. ?Anticipate will need plasmapheresis 3 days a week for 3-5 sessions can be set up as outpatient once the patient has a HD catheter placed. ?I have placed orders to start the patient on daratumumab and Velcade Revlimid dexamethasone for high risk myeloma to start hopefully early next week as outpatient. ?Scheduling message for this has been sent.  We will also set him up for lab monitoring and transfusion support as outpatient. ?He will likely need bisphosphonate therapies which we will set up as outpatient. ?Patient has been recommended to drink at least 2 to 3 L of water every day. ?We again discussed importance of absolute compliance  with treatment follow-up and management. ?I have referred him to our outpatient cancer center social worker to address barriers to  treatment including need for medical transportation and considerations of disability insurance management and medical insurance coverage questions. ?Sullivan Lone MD MS ? ?

## 2022-05-15 ENCOUNTER — Inpatient Hospital Stay (HOSPITAL_COMMUNITY): Payer: BC Managed Care – PPO

## 2022-05-15 DIAGNOSIS — D759 Disease of blood and blood-forming organs, unspecified: Secondary | ICD-10-CM

## 2022-05-15 DIAGNOSIS — C9000 Multiple myeloma not having achieved remission: Principal | ICD-10-CM

## 2022-05-15 DIAGNOSIS — D696 Thrombocytopenia, unspecified: Secondary | ICD-10-CM

## 2022-05-15 DIAGNOSIS — D649 Anemia, unspecified: Secondary | ICD-10-CM | POA: Diagnosis not present

## 2022-05-15 HISTORY — PX: IR US GUIDE VASC ACCESS RIGHT: IMG2390

## 2022-05-15 HISTORY — PX: IR FLUORO GUIDE CV LINE RIGHT: IMG2283

## 2022-05-15 LAB — TYPE AND SCREEN
ABO/RH(D): A POS
Antibody Screen: POSITIVE
Unit division: 0
Unit division: 0

## 2022-05-15 LAB — URINALYSIS, ROUTINE W REFLEX MICROSCOPIC
Bacteria, UA: NONE SEEN
Bilirubin Urine: NEGATIVE
Glucose, UA: >=500 mg/dL — AB
Hgb urine dipstick: NEGATIVE
Ketones, ur: NEGATIVE mg/dL
Leukocytes,Ua: NEGATIVE
Nitrite: NEGATIVE
Protein, ur: NEGATIVE mg/dL
Specific Gravity, Urine: 1.018 (ref 1.005–1.030)
pH: 6 (ref 5.0–8.0)

## 2022-05-15 LAB — BASIC METABOLIC PANEL
Anion gap: 6 (ref 5–15)
BUN: 27 mg/dL — ABNORMAL HIGH (ref 6–20)
CO2: 23 mmol/L (ref 22–32)
Calcium: 8.7 mg/dL — ABNORMAL LOW (ref 8.9–10.3)
Chloride: 97 mmol/L — ABNORMAL LOW (ref 98–111)
Creatinine, Ser: 1 mg/dL (ref 0.61–1.24)
GFR, Estimated: >60 mL/min (ref >60)
Glucose, Bld: 409 mg/dL — ABNORMAL HIGH (ref 70–99)
Potassium: 4.3 mmol/L (ref 3.5–5.1)
Sodium: 126 mmol/L — ABNORMAL LOW (ref 135–145)

## 2022-05-15 LAB — BPAM RBC
Blood Product Expiration Date: 202306102359
Blood Product Expiration Date: 202306132359
ISSUE DATE / TIME: 202305162321
ISSUE DATE / TIME: 202305170750
Unit Type and Rh: 6200
Unit Type and Rh: 6200

## 2022-05-15 LAB — IGG, IGA, IGM
IgA: 13 mg/dL — ABNORMAL LOW (ref 90–386)
IgG (Immunoglobin G), Serum: 10816 mg/dL — ABNORMAL HIGH (ref 603–1613)
IgM (Immunoglobulin M), Srm: 6 mg/dL — ABNORMAL LOW (ref 20–172)

## 2022-05-15 LAB — PATHOLOGIST SMEAR REVIEW

## 2022-05-15 LAB — PREPARE RBC (CROSSMATCH)

## 2022-05-15 MED ORDER — DIPHENHYDRAMINE HCL 25 MG PO CAPS: 25.0000 mg | ORAL_CAPSULE | Freq: Four times a day (QID) | ORAL | Status: DC | PRN

## 2022-05-15 MED ORDER — ACD FORMULA A 0.73-2.45-2.2 GM/100ML VI SOLN
Status: AC
  Administered 2022-05-15: 1000 mL
  Filled 2022-05-15: qty 1000

## 2022-05-15 MED ORDER — SODIUM CHLORIDE 0.9% IV SOLUTION: Freq: Once | INTRAVENOUS | Status: DC

## 2022-05-15 MED ORDER — CALCIUM CARBONATE ANTACID 500 MG PO CHEW
2.0000 | CHEWABLE_TABLET | ORAL | Status: DC
  Filled 2022-05-15: qty 2

## 2022-05-15 MED ORDER — CALCIUM CARBONATE ANTACID 500 MG PO CHEW: 2.0000 | CHEWABLE_TABLET | ORAL | Status: DC

## 2022-05-15 MED ORDER — CALCIUM GLUCONATE-NACL 2-0.675 GM/100ML-% IV SOLN: 2.0000 g | Freq: Once | INTRAVENOUS | Status: DC

## 2022-05-15 MED ORDER — HEPARIN SODIUM (PORCINE) 1000 UNIT/ML IJ SOLN
INTRAMUSCULAR | Status: AC
  Filled 2022-05-15: qty 4

## 2022-05-15 MED ORDER — HEPARIN SODIUM (PORCINE) 1000 UNIT/ML IJ SOLN: 1000.0000 [IU] | Freq: Once | INTRAMUSCULAR | Status: DC

## 2022-05-15 MED ORDER — ACETAMINOPHEN 325 MG PO TABS: 650.0000 mg | ORAL_TABLET | ORAL | Status: DC | PRN

## 2022-05-15 MED ORDER — SODIUM CHLORIDE 0.9 % IV SOLN
INTRAVENOUS | Status: AC
  Filled 2022-05-15 (×4): qty 200

## 2022-05-15 MED ORDER — ACD FORMULA A 0.73-2.45-2.2 GM/100ML VI SOLN
1000.0000 mL | Status: DC
  Filled 2022-05-15: qty 1000

## 2022-05-15 MED ORDER — CHLORHEXIDINE GLUCONATE CLOTH 2 % EX PADS
6.0000 | MEDICATED_PAD | Freq: Every day | CUTANEOUS | Status: DC
  Administered 2022-05-15 – 2022-05-19 (×5): 6 via TOPICAL

## 2022-05-15 MED ORDER — SODIUM CHLORIDE 0.9 % IV SOLN: Freq: Once | INTRAVENOUS | Status: DC

## 2022-05-15 MED ORDER — ACD FORMULA A 0.73-2.45-2.2 GM/100ML VI SOLN: 1000.0000 mL | Status: DC

## 2022-05-15 MED ORDER — HEPARIN SODIUM (PORCINE) 1000 UNIT/ML IJ SOLN
INTRAMUSCULAR | Status: AC
  Filled 2022-05-15: qty 10

## 2022-05-15 MED ORDER — CALCIUM GLUCONATE-NACL 2-0.675 GM/100ML-% IV SOLN
2.0000 g | Freq: Once | INTRAVENOUS | Status: AC
  Administered 2022-05-15: 2000 mg via INTRAVENOUS
  Filled 2022-05-15 (×2): qty 100

## 2022-05-15 MED ORDER — LIDOCAINE-EPINEPHRINE 1 %-1:100000 IJ SOLN
INTRAMUSCULAR | Status: AC
  Filled 2022-05-15: qty 1

## 2022-05-15 NOTE — Progress Notes (Addendum)
HEMATOLOGY-ONCOLOGY PROGRESS NOTE  ASSESSMENT AND PLAN: 1) Progressive IgG Lambda Multiple myeloma not on treatment for more than 1 year due to patient's lack of follow-up. Patient diagnosed December 2021 and received 1 cycle of CyBorD. Had previously presented with anemia renal insufficiency and hyperviscosity symptoms. Bx- 90% plasma cells Initial M spike 8.03g/dl Del(1p): Not Detected  Dup(1q): Not Detected  Gains(15): DETECTED  Gains(5 and 9): Not Detected  Del(13q)/-13: DETECTED  Del(17p)(TP53): Not Detected  IGH(Rearrangement): SEE BELOW    IgH complex: t(4;14): DETECTED  t(11;14): Not Detected  t(14;16): Not Detected  t(14;20): Not Detected   Cytogenetics: Normal male karyotype.  -Labs from 05/13/2022-beta-2 microglobulin 4.4, LDH 197, lambda free light chain 77.1, kappa, lambda light chain ratio 0.08, multiple myeloma panel pending.  UPEP ordered but not yet collected. 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  Due to multiple myeloma  PLAN: -Plan to initiate plasmapheresis later today.  Nephrology recommending 3-5 pheresis sessions every other day.  May be able to transition to outpatient. -Will need outpatient PET scan once he is discharged. -Continue dexamethasone 40 mg p.o. daily for total of 4 doses.  Continue PPI for GI prophylaxis. -We will follow-up on outstanding labs including myeloma panel and serum viscosity.  Discussed with nursing and asked them to initiate collection of 24-hour urine for UPEP. -We will likely need Port-A-Cath placement  for chemotherapy. -The patient has been set up for systemic chemotherapy in our office with daratumumab, Velcade, Revlimid, dexamethasone on 5/23.  He has a chemotherapy education appointment scheduled for 5/22. -Recommend PRBC transfusion for hemoglobin less than 7 or if symptomatic.  Mikey Bussing, DNP, AGPCNP-BC, AOCNP  SUBJECTIVE: Donald Suarez was seen today with the assistance of video interpreter.  His daughter was at the bedside.  Had a right IJ hemodialysis catheter placed earlier today.  Awaiting initiation of plasmapheresis.  He denies headaches and dizziness.  Denies chest pain and shortness of breath.  No bleeding.  He has no new complaints today.  Oncology History  Multiple myeloma not having achieved remission ([place])  01/01/2021 Initial Diagnosis   Multiple myeloma not having achieved remission ([place])    01/04/2021 - 01/11/2021 Chemotherapy   Patient is on Treatment Plan : MYELOMA NEWLY DIAGNOSED TRANSPLANT CANDIDATE Cyclophosphamide IV + Bortezomib SQ + Dexamethasone (CyBorD) q21d x 4 cycles      05/16/2022 -  Chemotherapy   Patient is on Treatment Plan : MYELOMA NEWLY DIAGNOSED TRANSPLANT CANDIDATE DaraVRd (Daratumumab IV) q21d x 6 Cycles (Induction/Consolidation)         REVIEW OF SYSTEMS:   Review of Systems  Constitutional:  Negative for chills and fever.  HENT: Negative.    Respiratory:  Negative for cough and shortness of breath.   Cardiovascular:  Negative for chest pain.  Gastrointestinal:  Negative for abdominal pain, nausea and vomiting.  Skin: Negative.   Neurological:  Negative for dizziness and headaches.  Endo/Heme/Allergies: Negative.   Psychiatric/Behavioral: Negative.     I have reviewed the past medical history, past surgical history, social history and family history with the patient and they are unchanged from previous note.   PHYSICAL EXAMINATION:  ECOG PERFORMANCE STATUS: 1 - Symptomatic but completely ambulatory  Vitals:   05/15/22 0427 05/15/22  0658  BP: 129/79   Pulse: (!) 101 93  Resp: (!) 21 20  Temp: 98.6 F (37 C)   SpO2: 97%    Filed Weights   05/13/22 0944  Weight: 81.6 kg    Intake/Output from previous day: 05/17 0701 - 05/18 0700 In: 446.3 [P.O.:120; Blood:326.3] Out: 0   Physical Exam Vitals reviewed.  Constitutional:      General: He is not in acute distress. HENT:     Head: Normocephalic.     Mouth/Throat:     Pharynx: No oropharyngeal exudate or posterior oropharyngeal erythema.  Eyes:     General: No scleral icterus.    Conjunctiva/sclera: Conjunctivae normal.  Neck:     Comments: Right IJ catheter in place. Cardiovascular:     Rate and Rhythm: Normal rate.  Abdominal:     General: There is no distension.     Palpations: Abdomen is soft.  Skin:    General: Skin is warm and dry.  Neurological:     Mental Status: He is alert and oriented to person, place, and time.    LABORATORY DATA:  I have reviewed the data as listed    Latest Ref Rng & Units 05/14/2022    4:59 AM 05/13/2022    6:36 PM 05/13/2022   11:30 AM  CMP  Glucose 70 - 99 mg/dL 160   183   173    BUN 6 - 20 mg/dL 22   22   22     Creatinine 0.61 - 1.24 mg/dL 0.90   1.02   0.97    Sodium 135 - 145 mmol/L 133   129   128    Potassium 3.5 - 5.1 mmol/L 4.4   3.8   3.7    Chloride 98 - 111 mmol/L 103   99   97    CO2 22 - 32 mmol/L 23   23   26     Calcium 8.9 - 10.3 mg/dL 9.6   10.2   10.2    Total Protein 6.5 - 8.1 g/dL >12.0    >12.0    Total Bilirubin 0.3 - 1.2 mg/dL 0.5    0.4    Alkaline Phos 38 - 126 U/L 43    43    AST 15 - 41 U/L 30    29    ALT 0 - 44 U/L 41    42      Lab Results  Component Value Date   WBC 8.9 05/14/2022   HGB 7.1 (L) 05/14/2022   HCT 21.9 (L) 05/14/2022   MCV 91.8 05/14/2022   PLT 78 (L) 05/14/2022   NEUTROABS 4.1 05/13/2022    No results found for: CEA1, CEA, OTR711, CA125, PSA1  [place] Fluoro Guide CV Line Right  Result Date: 05/15/2022 INDICATION: 56 year old male referred for temporary  hemodialysis catheter for plasmapheresis purpose EXAM: IMAGE GUIDED TEMPORARY HEMODIALYSIS CATHETER MEDICATIONS: None ANESTHESIA/SEDATION: None FLUOROSCOPY: Radiation Exposure Index (as provided by the fluoroscopic device): 1 mGy Kerma COMPLICATIONS: None PROCEDURE: Informed written consent was obtained from the patient's family after a discussion of the risks, benefits, and alternatives to treatment. Questions regarding the procedure were encouraged and answered. The right neck was prepped with chlorhexidine in a sterile fashion, and a sterile drape was applied covering the operative field. Maximum barrier sterile technique with sterile gowns and gloves were used for the procedure. A timeout was  performed prior to the initiation of the procedure. A micropuncture kit was utilized to access the right internal jugular vein under direct, real-time ultrasound guidance after the overlying soft tissues were anesthetized with 1% lidocaine with epinephrine. Ultrasound image documentation was performed. The microwire was kinked to measure appropriate catheter length. A stiff glidewire was advanced to the level of the IVC. A 16 cm hemodialysis catheter was then placed over the wire. Final catheter positioning was confirmed and documented with a spot radiographic image. The catheter aspirates and flushes normally. The catheter was flushed with appropriate volume heparin dwells. Dressings were applied. The patient tolerated the procedure well without immediate post procedural complication. IMPRESSION: Status post right IJ temporary hemodialysis catheter. Signed, Dulcy Fanny. Dellia Nims, RPVI [facility] Radiology Electronically Signed   By: Corrie Mckusick D.O.   On: 05/15/2022 09:57   Tyan US Guide Vasc Access Right  Result Date: 05/15/2022 INDICATION: 56 year old male referred for temporary hemodialysis catheter for plasmapheresis purpose EXAM: IMAGE GUIDED TEMPORARY HEMODIALYSIS  CATHETER MEDICATIONS: None ANESTHESIA/SEDATION: None FLUOROSCOPY: Radiation Exposure Index (as provided by the fluoroscopic device): 1 mGy Kerma COMPLICATIONS: None PROCEDURE: Informed written consent was obtained from the patient's family after a discussion of the risks, benefits, and alternatives to treatment. Questions regarding the procedure were encouraged and answered. The right neck was prepped with chlorhexidine in a sterile fashion, and a sterile drape was applied covering the operative field. Maximum barrier sterile technique with sterile gowns and gloves were used for the procedure. A timeout was performed prior to the initiation of the procedure. A micropuncture kit was utilized to access the right internal jugular vein under direct, real-time ultrasound guidance after the overlying soft tissues were anesthetized with 1% lidocaine with epinephrine. Ultrasound image documentation was performed. The microwire was kinked to measure appropriate catheter length. A stiff glidewire was advanced to the level of the IVC. A 16 cm hemodialysis catheter was then placed over the wire. Final catheter positioning was confirmed and documented with a spot radiographic image. The catheter aspirates and flushes normally. The catheter was flushed with appropriate volume heparin dwells. Dressings were applied. The patient tolerated the procedure well without immediate post procedural complication. IMPRESSION: Status post right IJ temporary hemodialysis catheter. Signed, Dulcy Fanny. Dellia Nims, RPVI [facility] Radiology Electronically Signed   By: Corrie Mckusick D.O.   On: 05/15/2022 09:57   DG Chest Port 1 View  Result Date: 05/13/2022 CLINICAL DATA:  Weakness EXAM: PORTABLE CHEST 1 VIEW COMPARISON:  None Available. FINDINGS: The heart size and mediastinal contours are within normal limits. Slightly low lung volumes. No focal airspace consolidation, pleural effusion, or  pneumothorax. The visualized skeletal structures are unremarkable. IMPRESSION: No active disease. Electronically Signed   By: Davina Poke D.O.   On: 05/13/2022 13:03   DG Bone Survey Met  Result Date: 05/14/2022 CLINICAL DATA:  Multiple myeloma. EXAM: METASTATIC BONE SURVEY COMPARISON:  None Available. FINDINGS: Multiple small rounded lucencies are noted in the skull, the largest noted in the frontal region. Small rounded lucency is noted in proximal right humeral shaft. Small rounded lucency is noted in the proximal right femoral shaft. No other abnormality is noted in the skeleton. IMPRESSION: Small rounded lucencies are noted in the skull, proximal right humerus and proximal right femur concerning for multiple myeloma. Electronically Signed   By: Marijo Conception M.D.   On: 05/14/2022 15:34     Future Appointments  Date Time Provider  [place]  05/19/2022  4:00 PM [facility] CHEMO EDU [facility] None  05/20/2022 12:30 PM [facility] LAB [facility] None  05/20/2022  1:00 PM Brunetta Genera, MD [facility] None  05/21/2022  7:30 AM [facility] INFUSION [facility] None      LOS: 1 day    ADDENDUM  .Patient was Personally and independently interviewed, examined and relevant elements of the history of present illness were reviewed in details and an assessment and plan was created. All elements of the patient's history of present illness , assessment and plan were discussed in details with Mikey Bussing DNP. The above documentation reflects our combined findings assessment and plan.    Sullivan Lone MD MS

## 2022-05-15 NOTE — Hospital Course (Addendum)
56 year old male with history of multiple myeloma who was lost to follow-up presented to the hospital with fatigue and dizziness he also complained of headache and some blurring of vision.  He was noted to have hemoglobin of 6 on admission with a platelets of 78,000.  Patient was admitted, transfused 2 units.  Patient seen by oncology Dr Irene Limbo concerned for hyperviscosity and needing plasmapheresis.  Patient was  transferred to [facility] for the same. Nephrology and interventional radiology were consulted for the same  for porta a cath-completed first session of plasmapheresis 5/18 and repeating plasmapheresis 5/20 and 5/22.  Patient was found to have uncontrolled hyperglycemia A1c 8s, seen by diabetic coordinator placed on insulin.  Patient underwent multiple sessions of plasmapheresis at this time he remains medically stable.  Insulin dosing adjusted after he completed his steroid therapy.  He is comfortable with insulin injection diabetic teaching family were involved.  At this time is medically stable discharge home.

## 2022-05-15 NOTE — Progress Notes (Signed)
[facility] Progress Note  Subjective: seen in room, no new c/o  Vitals:   05/14/22 1757 05/14/22 2152 05/15/22 0427 05/15/22 0658  BP: (!) 158/97 138/77 129/79   Pulse: (!) 104 98 (!) 101 93  Resp: 18 16 (!) 21 20  Temp: 98.2 F (36.8 C) 97.9 F (36.6 C) 98.6 F (37 C)   TempSrc: Oral Oral Oral   SpO2: 95% 93% 97%   Weight:      Height:        Exam: Gen alert, no distress No jvd or bruits Chest clear bilat to bases RRR no RG Abd soft ntnd no mass or ascites Ext no edema Neuro is alert, Ox 3 , nf         Home meds include - advil, ambien prn       Assessment/ Plan: Suspected hyperviscosity syndrome - related to untreated multiple myeloma. Pt has not been following up w/ his doctors. Oncology suspects hyperviscosity syndrome (^^total protein, dizziness, etc). He has had plasmapheresis at [facility] in the past for hyperviscosity syndrome related to his myeloma. We were asked to see for plasmapheresis. Access placed by Derian 5/18. Have ordered plasmapheresis qod for 3- 5 sessions total (Oncology to decide duration).  Anemia - getting prbcs per pmd Multiple myeloma - w/ low albumin, normal renal function. Get UA.  Hyponatremia - mild, urine studies sent     Kelly Splinter 05/15/2022, 3:37 PM   Recent Labs  Lab 05/13/22 1836 05/14/22 0459 05/14/22 1315  HGB  --  6.7* 7.1*  ALBUMIN 1.6* 1.5*  --   CALCIUM 10.2 9.6  --   PHOS 5.5* 3.7  --   CREATININE 1.02 0.90  --   K 3.8 4.4  --    Inpatient medications:  calcium carbonate  2 tablet Oral Q3H   calcium carbonate  2 tablet Oral Q3H   Chlorhexidine Gluconate Cloth  6 each Topical Daily   dexamethasone  40 mg Oral Daily   feeding supplement  237 mL Oral BID BM   heparin sodium (porcine)  1,000 Units Intracatheter Once   heparin sodium (porcine)  1,000 Units Intracatheter Once   pantoprazole  40 mg Oral Daily    therapeutic plasma exchange solution     therapeutic plasma exchange solution     calcium  gluconate     calcium gluconate     citrate dextrose     citrate dextrose     citrate dextrose     acetaminophen **OR** acetaminophen, acetaminophen, acetaminophen, diphenhydrAMINE, diphenhydrAMINE, ondansetron **OR** ondansetron (ZOFRAN) IV

## 2022-05-15 NOTE — Procedures (Signed)
Tolerated first PLEX treatment well with no change in prior assessment noted.  All vital signs remained stable and pt was returned to his room in stable condition. Total Plasma Removed 3538, Total Volume Replaced 2481.

## 2022-05-15 NOTE — Progress Notes (Signed)
Initial Nutrition Assessment  DOCUMENTATION CODES:   Not applicable  INTERVENTION:   Liberalize diet to REGULAR to increase meal options; family ok to bring in outside food.   Ensure Enlive po BID, each supplement provides 350 kcal and 20 grams of protein.  Consider B-12 supplementation as lab value low normal, no CRP checked, low risk for administration as water soluble vitamin  NUTRITION DIAGNOSIS:   Increased nutrient needs related to acute illness as evidenced by estimated needs.  GOAL:   Patient will meet greater than or equal to 90% of their needs  MONITOR:   PO intake, Supplement acceptance, Labs, Weight trends  REASON FOR ASSESSMENT:   Malnutrition Screening Tool    ASSESSMENT:   56 yo male admitted with symptomatic anemia, Multiple Myeloma with suspected hyperviscosity syndrome with plans to receive plasmapheresis, hypercalcemia. Pt is [place], Chartered loss adjuster.  Oncology consulted; Plasmapheresis to start today. Plan for outpatient PET scan post /dc, plans for systemic chemotherapy.  Interpretor at bedside; pt's daughter in room as well. All information obtain via interpretor  Pt reports appetite is good and has been good at home. PO intake is so-so as pt does not really care of food options in hospital, prefers home cooked meals at home; encouraged pt to have family bring in food from home if they are able.   Pt likes protein shakes-Boost at bedside.  Wife and daughter do the cooking at home; pt does work and has had no decline in physical function.   Pt is unsure if he has had any changes in weight. Pt does confirm his height is 5'6" but is not really sure of UBW.   Current wt 81.6 kg; no weight loss based on weight encounters.   Vitamin Labs:  CRP: not available Vitamin B12: 329 Folate: 9.2 (wdl)  Labs: albumin <1.5, corrected calcium 11.6 (H), total protein >12, sodium 133,  Meds: decadron  NUTRITION - FOCUSED PHYSICAL EXAM:  Flowsheet Row  Most Recent Value  Orbital Region No depletion  Upper Arm Region No depletion  Thoracic and Lumbar Region No depletion  Buccal Region No depletion  Temple Region No depletion  Clavicle Bone Region No depletion  Clavicle and Acromion Bone Region No depletion  Scapular Bone Region No depletion  Dorsal Hand No depletion  Patellar Region No depletion  Anterior Thigh Region No depletion  Posterior Calf Region No depletion  Edema (RD Assessment) None       Diet Order:  HEART HEALTHY   EDUCATION NEEDS:   Education needs have been addressed  Skin:  Skin Assessment: Reviewed RN Assessment  Last BM:  5/17  Height:   Ht Readings from Last 1 Encounters:  05/13/22 5' 6"  (1.676 m)    Weight:   Wt Readings from Last 1 Encounters:  05/13/22 81.6 kg     BMI:  Body mass index is 29.05 kg/m.  Estimated Nutritional Needs:   Kcal:  2000-2200 kcals  Protein:  100-120 g  Fluid:  >/= 2 L   Kerman Passey MS, RDN, LDN, CNSC Registered Dietitian III Clinical Nutrition RD Pager and On-Call Pager Number Located in [place]

## 2022-05-15 NOTE — Consult Note (Signed)
[name]   HEMATOLOGY/ONCOLOGY CONSULTATION NOTE  Date of Service: 05/15/2022  Patient Care Team: Pcp, No as PCP - General  CHIEF COMPLAINTS/PURPOSE OF CONSULTATION:  Management of multiple myeloma with possible hyperviscosity syndrome  HISTORY OF PRESENTING ILLNESS:   Donald Suarez is a wonderful 56 y.o. [place] male who has been referred to [name] by Dr Jacki Cones, MD for evaluation and management of progressive multiple myeloma with concern for hyperviscosity syndrome  Patient was previously diagnosed with multiple myeloma in December 2021 when he presented to [facility] with symptoms of altered mental status symptomatic anemia and visual blurring. 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 His initial bone marrow biopsy from 12/25/2020 showed results revealing "95% cellular bone marrow with greater than 90% involvement by monoclonal plasma cells showing cytoplasmic lambda light chain restriction. Cytogenetics and medical cytogenetics as noted below Additional testing at [facility]:   Del(1p): Not Detected  Dup(1q): Not Detected  Gains(15): DETECTED  Gains(5 and 9): Not Detected  Del(13q)/-13: DETECTED  Del(17p)(TP53): Not Detected  IGH(Rearrangement): SEE BELOW    IgH complex: t(4;14): DETECTED  t(11;14): Not Detected  t(14;16): Not Detected  t(14;20): Not Detected   Cytogenetics: Normal male karyotype.  Previous PET CT scan on 12/26/2020 showed PET/CT completed on 12/26/2020 with results revealing "1. No focal osseous or soft tissue hypermetabolism to suggest metabolically active multiple myeloma. 2. Small lucencies noted within the right frontal calvarium, right T1 vertebral body and T4 vertebral body, nonspecific. Attention on follow-up imaging."   Pt has had MRI Brain completed on 12/26/2020 with results revealing "1. No definite T2 signal abnormality or pathologic enhancement along the visualized  optic pathways, noting motion degradation of coronal STIR images through the orbits. No retrobulbar mass and symmetric extraocular muscles. 2. No acute intracranial abnormality. 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 3. Questionable small faintly enhancing lesions in the right parietal and left temporal calvarium, indeterminate but favored to represent vasculature and less likely multiple myelomatous disease. Heterogeneous clivus without associated enhancement, nonspecific."   Patient was started on CyBorD and transitioned his care to [name] in completed 1 cycle of CyBorD at the [facility]  After this the patient decided to not follow-up despite multiple reminders and calls and decided to not pursue additional treatment.  He apparently did not follow-up anywhere else for his myeloma treatment either.  He notes that he was having issues with insurance and transportation and we discussed that this could certainly have been addressed by her Education officer, museum. We had this discussion with the use of a with him his interpreter and his son-in-law who understands and English and was helping with interpretation as well. He currently works at [employer].  Patient presented to the hospital with 2 to 3 weeks of progressively severe fatigue headaches and blurring of vision. His total protein levels are more than 12 myeloma labs were sent and are currently pending. He notes that he is not keen to pursue active treatments and understands how serious this is.  No focal new bone pains. Good p.o. intake and adequate fluid intake at this time.    MEDICAL HISTORY:  Past Medical History:  Diagnosis Date   Anemia   Multiple myeloma diagnosed December 2021 Noncompliance with follow-up  SURGICAL HISTORY: Past Surgical History:  Procedure Laterality Date   APPENDECTOMY     Gaurav FLUORO GUIDE CV LINE RIGHT  05/15/2022   [place] US GUIDE VASC ACCESS RIGHT  05/15/2022  SOCIAL HISTORY: Social History    Socioeconomic History   Marital status: Married    Spouse name: Not on file   Number of children: Not on file   Years of education: Not on file   Highest education level: Not on file  Occupational History   Not on file  Tobacco Use   Smoking status: Never   Smokeless tobacco: Never  Vaping Use   Vaping Use: Never used  Substance and Sexual Activity   Alcohol use: Never   Drug use: Never   Sexual activity: Not on file  Other Topics Concern   Not on file  Social History Narrative   Not on file   Social Determinants of Health   Financial Resource Strain: Not on file  Food Insecurity: Not on file  Transportation Needs: Not on file  Physical Activity: Not on file  Stress: Not on file  Social Connections: Not on file  Intimate Partner Violence: Not on file    FAMILY HISTORY: History reviewed. No pertinent family history.  ALLERGIES:  has No Known Allergies.  MEDICATIONS:  Current Facility-Administered Medications  Medication Dose Route Frequency Provider Last Rate Last Admin   acetaminophen (TYLENOL) tablet 650 mg  650 mg Oral Q6H PRN Marylyn Ishihara, Tyrone A, DO   650 mg at 05/14/22 0750   Or   acetaminophen (TYLENOL) suppository 650 mg  650 mg Rectal Q6H PRN Marylyn Ishihara, Tyrone A, DO       dexamethasone (DECADRON) tablet 40 mg  40 mg Oral Daily Brunetta Genera, MD   40 mg at 05/14/22 1129   feeding supplement (ENSURE ENLIVE / ENSURE PLUS) liquid 237 mL  237 mL Oral BID BM Kyle, Tyrone A, DO   237 mL at 05/14/22 1511   heparin sodium (porcine) 1000 UNIT/ML injection            lidocaine-EPINEPHrine (XYLOCAINE W/EPI) 1 %-1:100000 (with pres) injection            ondansetron (ZOFRAN) tablet 4 mg  4 mg Oral Q6H PRN Marylyn Ishihara, Tyrone A, DO       Or   ondansetron (ZOFRAN) injection 4 mg  4 mg Intravenous Q6H PRN Marylyn Ishihara, Tyrone A, DO       pantoprazole (PROTONIX) EC tablet 40 mg  40 mg Oral Daily Brunetta Genera, MD   40 mg at 05/14/22 1129    REVIEW OF SYSTEMS:    10 Point review  of Systems was done is negative except as noted above.  PHYSICAL EXAMINATION: ECOG PERFORMANCE STATUS: 1 - Symptomatic but completely ambulatory  . Vitals:   05/15/22 0427 05/15/22 0658  BP: 129/79   Pulse: (!) 101 93  Resp: (!) 21 20  Temp: 98.6 F (37 C)   SpO2: 97%    Filed Weights   05/13/22 0944  Weight: 180 lb (81.6 kg)   .Body mass index is 29.05 kg/m.  GENERAL:alert, in no acute distress and comfortable SKIN: no acute rashes, no significant lesions EYES: conjunctiva are pink and non-injected, sclera anicteric OROPHARYNX: MMM, no exudates, no oropharyngeal erythema or ulceration NECK: supple, no JVD LYMPH:  no palpable lymphadenopathy in the cervical, axillary or inguinal regions LUNGS: clear to auscultation b/l with normal respiratory effort HEART: regular rate & rhythm ABDOMEN:  normoactive bowel sounds , non tender, not distended. Extremity: no pedal edema PSYCH: alert & oriented x 3 with fluent speech NEURO: no focal motor/sensory deficits  LABORATORY DATA:  I have reviewed the data as listed  .  Latest Ref Rng & Units 05/14/2022    1:15 PM 05/14/2022    4:59 AM 05/13/2022    4:39 PM  CBC  WBC 4.0 - 10.5 K/uL  8.9     Hemoglobin 13.0 - 17.0 g/dL 7.1   6.7   6.0    Hematocrit 39.0 - 52.0 % 21.9   21.4   19.4    Platelets 150 - 400 K/uL  78       .    Latest Ref Rng & Units 05/14/2022    4:59 AM 05/13/2022    6:36 PM 05/13/2022   11:30 AM  CMP  Glucose 70 - 99 mg/dL 160   183   173    BUN 6 - 20 mg/dL 22   22   22     Creatinine 0.61 - 1.24 mg/dL 0.90   1.02   0.97    Sodium 135 - 145 mmol/L 133   129   128    Potassium 3.5 - 5.1 mmol/L 4.4   3.8   3.7    Chloride 98 - 111 mmol/L 103   99   97    CO2 22 - 32 mmol/L 23   23   26     Calcium 8.9 - 10.3 mg/dL 9.6   10.2   10.2    Total Protein 6.5 - 8.1 g/dL >12.0    >12.0    Total Bilirubin 0.3 - 1.2 mg/dL 0.5    0.4    Alkaline Phos 38 - 126 U/L 43    43    AST 15 - 41 U/L 30    29    ALT 0 - 44  U/L 41    42       RADIOGRAPHIC STUDIES: I have personally reviewed the radiological images as listed and agreed with the findings in the report.  12/26/2020 [facility] MRI Brain:   12/26/2020 [facility] PET/CT:   [place] Fluoro Guide CV Line Right  Result Date: 05/15/2022 INDICATION: 56 year old male referred for temporary hemodialysis catheter for plasmapheresis purpose EXAM: IMAGE GUIDED TEMPORARY HEMODIALYSIS CATHETER MEDICATIONS: None ANESTHESIA/SEDATION: None FLUOROSCOPY: Radiation Exposure Index (as provided by the fluoroscopic device): 1 mGy Kerma COMPLICATIONS: None PROCEDURE: Informed written consent was obtained from the patient's family after a discussion of the risks, benefits, and alternatives to treatment. Questions regarding the procedure were encouraged and answered. The right neck was prepped with chlorhexidine in a sterile fashion, and a sterile drape was applied covering the operative field. Maximum barrier sterile technique with sterile gowns and gloves were used for the procedure. A timeout was performed prior to the initiation of the procedure. A micropuncture kit was utilized to access the right internal jugular vein under direct, real-time ultrasound guidance after the overlying soft tissues were anesthetized with 1% lidocaine with epinephrine. Ultrasound image documentation was performed. The microwire was kinked to measure appropriate catheter length. A stiff glidewire was advanced to the level of the IVC. A 16 cm hemodialysis catheter was then placed over the wire. Final catheter positioning was confirmed and documented with a spot radiographic image. The catheter aspirates and flushes normally. The catheter was flushed with appropriate volume heparin dwells. Dressings were applied. The patient tolerated the procedure well without immediate post procedural complication. IMPRESSION: Status post right IJ temporary hemodialysis catheter. Signed, Dulcy Fanny. Dellia Nims, RPVI V[facility]  Interventional Radiology Specialists [facility] Electronically Signed   By: Corrie Mckusick D.O.   On: 05/15/2022 09:57   [place] US Guide  Vasc Access Right  Result Date: 05/15/2022 INDICATION: 56 year old male referred for temporary hemodialysis catheter for plasmapheresis purpose EXAM: IMAGE GUIDED TEMPORARY HEMODIALYSIS CATHETER MEDICATIONS: None ANESTHESIA/SEDATION: None FLUOROSCOPY: Radiation Exposure Index (as provided by the fluoroscopic device): 1 mGy Kerma COMPLICATIONS: None PROCEDURE: Informed written consent was obtained from the patient's family after a discussion of the risks, benefits, and alternatives to treatment. Questions regarding the procedure were encouraged and answered. The right neck was prepped with chlorhexidine in a sterile fashion, and a sterile drape was applied covering the operative field. Maximum barrier sterile technique with sterile gowns and gloves were used for the procedure. A timeout was performed prior to the initiation of the procedure. A micropuncture kit was utilized to access the right internal jugular vein under direct, real-time ultrasound guidance after the overlying soft tissues were anesthetized with 1% lidocaine with epinephrine. Ultrasound image documentation was performed. The microwire was kinked to measure appropriate catheter length. A stiff glidewire was advanced to the level of the IVC. A 16 cm hemodialysis catheter was then placed over the wire. Final catheter positioning was confirmed and documented with a spot radiographic image. The catheter aspirates and flushes normally. The catheter was flushed with appropriate volume heparin dwells. Dressings were applied. The patient tolerated the procedure well without immediate post procedural complication. IMPRESSION: Status post right IJ temporary hemodialysis catheter. Signed, Dulcy Fanny. Dellia Nims, RPVI Vascular and Interventional Radiology Specialists [facility] Radiology Electronically Signed   By: Corrie Mckusick D.O.   On: 05/15/2022 09:57   DG Chest Port 1 View  Result Date: 05/13/2022 CLINICAL DATA:  Weakness EXAM: PORTABLE CHEST 1 VIEW COMPARISON:  None Available. FINDINGS: The heart size and mediastinal contours are within normal limits. Slightly low lung volumes. No focal airspace consolidation, pleural effusion, or pneumothorax. The visualized skeletal structures are unremarkable. IMPRESSION: No active disease. Electronically Signed   By: Davina Poke D.O.   On: 05/13/2022 13:03   DG Bone Survey Met  Result Date: 05/14/2022 CLINICAL DATA:  Multiple myeloma. EXAM: METASTATIC BONE SURVEY COMPARISON:  None Available. FINDINGS: Multiple small rounded lucencies are noted in the skull, the largest noted in the frontal region. Small rounded lucency is noted in proximal right humeral shaft. Small rounded lucency is noted in the proximal right femoral shaft. No other abnormality is noted in the skeleton. IMPRESSION: Small rounded lucencies are noted in the skull, proximal right humerus and proximal right femur concerning for multiple myeloma. Electronically Signed   By: Marijo Conception M.D.   On: 05/14/2022 15:34    ASSESSMENT & PLAN:   1) Progressive IgG Lambda Multiple myeloma not on treatment for more than 1 year due to patient's lack of follow-up. Patient diagnosed December 2021 and received 1 cycle of CyBorD. Had previously presented with anemia renal insufficiency and hyperviscosity symptoms. Bx- 90% plasma cells Initial M spike 8.03g/dl Del(1p): Not Detected  Dup(1q): Not Detected  Gains(15): DETECTED  Gains(5 and 9): Not Detected  Del(13q)/-13: DETECTED  Del(17p)(TP53): Not Detected  IGH(Rearrangement): SEE BELOW    IgH complex: t(4;14): DETECTED  t(11;14): Not Detected  t(14;16): Not Detected  t(14;20): Not Detected   Cytogenetics: Normal male karyotype.   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  Due to multiple myeloma  PLAN: -Pt's primary contact is Blui Romah [phone] (son in law) -We  discussed he has high risk multiple myeloma with significant progression as a result of patient declining previous treatments. -Symptomatic anemia due to myeloma -Hypercalcemia due to multiple myeloma -High risk of hyperviscosity given very elevated gammaglobulin levels.   -I discussed the current situation with the patient and his son-in-law's in detail at bedside with the help of a [place] interpreter. -We discussed that this is a very concerning situation since the patient has had myeloma progression over the last year since he was lost to follow-up.  He notes he had issues with insurance but we discussed in detail that that would not be a reason to not follow-up for treatment.  We discussed that the social worker and the [place] could have addressed his insurance issues and medical transportation issues as well.  Patient notes he is apologetic and is inclined to pursue treatment probably this time around. -Patient will need outpatient PET CT scan but will do a whole body bone survey while in the hospital since we cannot do an inpatient PET/CT. -We will start the patient on dexamethasone 40 mg p.o. daily for 4 doses with PPI for GI prophylaxis. -Will likely need Port-A-Cath placement with recommend consulting [place]. -Nephrology consultation to determine need for plasmapheresis.  Discussed with Dr. Percell Locus did agree with recommendation for plasmapheresis likely every other day for 3-5 sessions. -PRBC transfusion for hgb<7 or if symptomatic.  -We will be setting him up for daratumumab Velcade Revlimid dexamethasone treatment for high risk myeloma starting within the next week. -We will continue to follow -Social worker consultation to address barriers to treatment  including medical transportation needs insurance issues and disability Chief Strategy Officer and recommendations.    All of the patients questions were answered with apparent satisfaction. The patient knows to call the clinic with any problems, questions or concerns. The total time spent in the appointment was 110 minutes*.  All of the patient's questions were answered with apparent satisfaction. The patient knows to call the clinic with any problems, questions or concerns.   Sullivan Lone MD MS AAHIVMS [facility] Hematology/Oncology Physician [facility]  .*Total Encounter Time as defined by the Centers for Medicare and Medicaid Services includes, in addition to the face-to-face time of a patient visit (documented in the note above) non-face-to-face time: obtaining and reviewing outside history, ordering and reviewing medications, tests or procedures, care coordination (communications with other health care professionals or caregivers) and documentation in the medical record.

## 2022-05-15 NOTE — TOC Initial Note (Signed)
Transition of Care [facility]) - Initial/Assessment Note    Patient Details  Name: Donald Suarez MRN: [id number] Date of Birth: [birth year]  [facility] CM/SW Contact:    Tom-Johnson, Renea Ee, RN Phone Number: 05/15/2022, 1:29 PM  Clinical Narrative:                  CM spoke with patient and daughter, Donald Suarez at bedside about needs for post hospital transition. Patient is from [place] and speaks [place]. Daughter speaks English and able to translate.  Patient is admitted for Symptomatic Anemia. Hgb on admission was 6.0.  2 units PRBC has been given and hgb now is at 7.1.  Patient has Hx of Multiple Myeloma, not compliant with f/u appointments. Scheduled for Plasmapheresis. Has scheduled HD cath placement today.  From home with wife and daughter. Has five children, four are in [place]. 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 Independent with care and drive self prior to admission. Does not have a PCP and has no preference. Hosp f/u scheduled with [place] and info on AVS. [facility] [address].  CM consulted for Medication Assistance. Patient has [employer] PPO insurance. CM contacted MD about medication. CM will f/u once medication is ascertained.  Expected Discharge Plan: Home/Self Care Barriers to Discharge: Continued Medical Work up   Patient Goals and CMS Choice Patient states their goals for this hospitalization and ongoing recovery are:: To return home. CMS Medicare.gov Compare Post Acute Care list provided to:: Patient Choice offered to / list presented to : NA  Expected Discharge Plan and Services Expected Discharge Plan: Home/Self Care   Discharge Planning Services: CM Consult, Medication Assistance, Follow-up appt scheduled Post Acute Care Choice: NA Living arrangements for the past 2 months: Single Family Home                 DME Arranged: N/A DME Agency: NA       HH Arranged: NA HH Agency: NA        Prior  Living Arrangements/Services Living arrangements for the past 2 months: Single Family Home Lives with:: Spouse, Adult Children Patient language and need for interpreter reviewed:: Yes Do you feel safe going back to the place where you live?: Yes      Need for Family Participation in Patient Care: Yes (Comment) Care giver support system in place?: Yes (comment)   Criminal Activity/Legal Involvement Pertinent to Current Situation/Hospitalization: No - Comment as needed  Activities of Daily Living Home Assistive Devices/Equipment: None ADL Screening (condition at time of admission) Patient's cognitive ability adequate to safely complete daily activities?: Yes Is the patient deaf or have difficulty hearing?: No Does the patient have difficulty seeing, even when wearing glasses/contacts?: No Does the patient have difficulty concentrating, remembering, or making decisions?: No Patient able to express need for assistance with ADLs?: Yes Does the patient have difficulty dressing or bathing?: No Independently performs ADLs?: Yes (appropriate for developmental age) Does the patient have difficulty walking or climbing stairs?: No Weakness of Legs: Both Weakness of Arms/Hands: None  Permission Sought/Granted Permission sought to share information with : Case Manager, Family Supports, Chartered certified accountant granted to share information with : Yes, Verbal Permission Granted              Emotional Assessment Appearance:: Appears stated age Attitude/Demeanor/Rapport: Engaged, Gracious Affect (typically observed): Accepting, Appropriate, Calm, Hopeful Orientation: : Oriented to Self, Oriented to Place, Oriented to  Time, Oriented to Situation  Alcohol / Substance Use: Not Applicable Psych Involvement: No (comment)  Admission diagnosis:  Symptomatic anemia [D64.9] Patient Active Problem List   Diagnosis Date Noted   Hyperviscosity    Symptomatic anemia 05/13/2022    Hyponatremia 05/13/2022   Hyperglycemia 05/13/2022   Thrombocytopenia ([place]) 05/13/2022   Hypoalbuminemia 05/13/2022   Multiple myeloma not having achieved remission ([place]) 01/01/2021   Counseling regarding advance care planning and goals of care 01/01/2021   PCP:  Pcp, No [id number] [place] [place] [address]Phone: [phone]:: [phone]    Social Determinants of Health (SDOH) Interventions    Readmission Risk Interventions     View : No data to display.

## 2022-05-15 NOTE — Progress Notes (Signed)
PROGRESS NOTE Donald Suarez  MVH:[id number] DOB: 08/10/1966 DOA: 05/13/2022 PCP: Pcp, No   Brief Narrative/Hospital Course: 56 year old male with history of multiple myeloma who was lost to follow-up presented to the hospital with fatigue and dizziness he also complained of headache and some blurring of vision.  He was noted to have hemoglobin of 6 on admission with a platelets of 78,000.  Patient was admitted, transfused 2 units.  Patient seen by oncology Dr Irene Limbo concerned for hyperviscosity and needing plasmapheresis.  Patient was  transferred to [facility] for the same. Nephrology and interventional radiology were consulted for the same  for porta a cath      Subjective: Seen and examined this morning.  Appears pleasant, daughter at the bedside.  Denies any complaint.   Assessment and Plan: Principal Problem:   Symptomatic anemia Active Problems:   Multiple myeloma not having achieved remission (HCC)   Hyponatremia   Hyperglycemia   Thrombocytopenia (HCC)   Hypoalbuminemia   Hyperviscosity   Multiple myeloma not having achieved remission: She was lost to follow-up.  Had admission to [facility] for hyperviscosity and had plasmapheresis done a year ago.  Seen by oncology recommending plasmapheresis given concern for hyperviscosity especially after receiving blood transfusion, transferred to [facility] [place]-for Port-A-Cath and nephrology on board for plasmapheresis.  Symptomatic anemia: Received 2 units PRBC keep hemoglobin above 7 g.  Continue plan as per hematology. Recent Labs  Lab 05/13/22 1130 05/13/22 1639 05/14/22 0459 05/14/22 1315  HGB 6.7* 6.0* 6.7* 7.1*  HCT 19.9* 19.4* 21.4* 21.9*    Hypercalcemia due to multiple myeloma oncology recommends bisphosphonates as outpatient.  Monitor calcium level inpatient. Recent Labs  Lab 05/13/22 1130 05/13/22 1836 05/14/22 0459  K 3.7 3.8 4.4  CALCIUM 10.2 10.2 9.6  PHOS  --  5.5* 3.7    Mild hyponatremia monitor Severe hypoalbuminemia in the  setting of multiple myeloma. Thrombocytopenia due to multiple myeloma.  Monitor. Recent Labs  Lab 05/13/22 1130 05/14/22 0459  PLT 93* 78*    Body mass index is 29.05 kg/m.  Overweight. DVT prophylaxis: SCDs Start: 05/13/22 1609 Code Status:   Code Status: Full Code Family Communication: plan of care discussed with patient/daughter at bedside. Patient status is: Inpatient because of ongoing plasmapheresis Level of care: Med-Surg   Dispo: The patient is from: Home            Anticipated disposition: home  Mobility Assessment (last 72 hours)     Mobility Assessment     Row Name 05/14/22 1700 05/14/22 0804 05/13/22 2116 05/13/22 1700     Does patient have an order for bedrest or is patient medically unstable No - Continue assessment No - Continue assessment No - Continue assessment No - Continue assessment    What is the highest level of mobility based on the progressive mobility assessment? Level 6 (Walks independently in room and hall) - Balance while walking in room without assist - Complete Level 6 (Walks independently in room and hall) - Balance while walking in room without assist - Complete Level 6 (Walks independently in room and hall) - Balance while walking in room without assist - Complete Level 6 (Walks independently in room and hall) - Balance while walking in room without assist - Complete              Objective: Vitals last 24 hrs: Vitals:   05/14/22 1757 05/14/22 2152 05/15/22 0427 05/15/22 0658  BP: (!) 158/97 138/77 129/79   Pulse: (!) 104 98 (!)  101 93  Resp: 18 16 (!) 21 20  Temp: 98.2 F (36.8 C) 97.9 F (36.6 C) 98.6 F (37 C)   TempSrc: Oral Oral Oral   SpO2: 95% 93% 97%   Weight:      Height:       Weight change:   Physical Examination: General exam: alert awake,  pleasant older than stated age, weak appearing. HEENT:Oral mucosa moist, Ear/Nose WNL grossly, dentition normal. Respiratory system: bilaterally diminished BS, no use of accessory  muscle Cardiovascular system: S1 & S2 +, No JVD. Gastrointestinal system: Abdomen soft,NT,ND, BS+ Nervous System:Alert, awake, moving extremities and grossly nonfocal Extremities: LE edema none,distal peripheral pulses palpable.  Skin: No rashes,no icterus. MSK: Normal muscle bulk,tone, power triple-lumen right IJ HD catheter +,  Medications reviewed:  Scheduled Meds:  Chlorhexidine Gluconate Cloth  6 each Topical Daily   dexamethasone  40 mg Oral Daily   feeding supplement  237 mL Oral BID BM   heparin sodium (porcine)       lidocaine-EPINEPHrine       pantoprazole  40 mg Oral Daily   Continuous Infusions:    Diet Order             Diet Heart Room service appropriate? Yes; Fluid consistency: Thin  Diet effective now                   Intake/Output Summary (Last 24 hours) at 05/15/2022 1109 Last data filed at 05/15/2022 0200 Gross per 24 hour  Intake 120 ml  Output 0 ml  Net 120 ml   Net IO Since Admission: 1,275.33 mL [05/15/22 1109]  Wt Readings from Last 3 Encounters:  05/13/22 81.6 kg  01/11/21 80.2 kg  01/01/21 79.5 kg     Unresulted Labs (From admission, onward)     Start     Ordered   05/16/22 7672  Basic metabolic panel  Daily,   R      05/15/22 0830   05/16/22 0500  CBC  Daily,   R      05/15/22 0830   05/14/22 1114  Viscosity, serum  Once,   R        05/14/22 1114   05/14/22 0000  Pretreatment RBC phenotype  R       Comments: Obtain prior to daratumumab treatment.   Question:  Medication to be given:  Answer:  Daratumumab   05/14/22 1127   05/13/22 1556  Multiple Myeloma Panel (SPEP&IFE w/QIG)  Once,   R        05/13/22 1555   05/13/22 1556  UPEP/UIFE/Light Chains/TP, 24-Hr Ur  Once,   R        05/13/22 1555   05/13/22 1518  Sodium, urine, random  Once,   R        05/13/22 1518   05/13/22 1518  Osmolality, urine  Once,   R        05/13/22 1518   05/13/22 1130  Pathologist smear review  Once,   R        05/13/22 1130   Signed and Held   Basic metabolic panel  Once,   R       Comments: Prior to TPE    Signed and Held   Signed and Held  Therapeutic plasma exchange with: TPE machine calculated volume.  Obtained from entering patient's HCT, weight, height, and gender into the machine. (blood bank)  Once,   R        Signed  and Held          Data Reviewed: I have personally reviewed following labs and imaging studies CBC: Recent Labs  Lab 05/13/22 1130 05/13/22 1639 05/14/22 0459 05/14/22 1315  WBC 10.3  --  8.9  --   NEUTROABS 4.1  --   --   --   HGB 6.7* 6.0* 6.7* 7.1*  HCT 19.9* 19.4* 21.4* 21.9*  MCV 93.0  --  91.8  --   PLT 93*  --  78*  --    Basic Metabolic Panel: Recent Labs  Lab 05/13/22 1130 05/13/22 1836 05/14/22 0459  NA 128* 129* 133*  K 3.7 3.8 4.4  CL 97* 99 103  CO2 26 23 23   GLUCOSE 173* 183* 160*  BUN 22* 22* 22*  CREATININE 0.97 1.02 0.90  CALCIUM 10.2 10.2 9.6  PHOS  --  5.5* 3.7   GFR: Estimated Creatinine Clearance: 93 mL/min (by C-G formula based on SCr of 0.9 mg/dL). Liver Function Tests: Recent Labs  Lab 05/13/22 1130 05/13/22 1836 05/14/22 0459  AST 29  --  30  ALT 42  --  41  ALKPHOS 43  --  43  BILITOT 0.4  --  0.5  PROT >12.0*  --  >12.0*  ALBUMIN 1.6* 1.6* 1.5*   No results for input(s): LIPASE, AMYLASE in the last 168 hours. No results for input(s): AMMONIA in the last 168 hours. Coagulation Profile: No results for input(s): INR, PROTIME in the last 168 hours. BNP (last 3 results) No results for input(s): PROBNP in the last 8760 hours. HbA1C: Recent Labs    05/13/22 1639  HGBA1C 8.2*   CBG: No results for input(s): GLUCAP in the last 168 hours. Lipid Profile: No results for input(s): CHOL, HDL, LDLCALC, TRIG, CHOLHDL, LDLDIRECT in the last 72 hours. Thyroid Function Tests: No results for input(s): TSH, T4TOTAL, FREET4, T3FREE, THYROIDAB in the last 72 hours. Sepsis Labs: No results for input(s): PROCALCITON, LATICACIDVEN in the last 168 hours.  No  results found for this or any previous visit (from the past 240 hour(s)).  Antimicrobials: Anti-infectives (From admission, onward)    None      Culture/Microbiology No results found for: SDES, SPECREQUEST, CULT, REPTSTATUS  Other culture-see note  Radiology Studies: Ervin Fluoro Guide CV Line Right  Result Date: 05/15/2022 INDICATION: 56 year old male referred for temporary hemodialysis catheter for plasmapheresis purpose EXAM: IMAGE GUIDED TEMPORARY HEMODIALYSIS CATHETER MEDICATIONS: None ANESTHESIA/SEDATION: None FLUOROSCOPY: Radiation Exposure Index (as provided by the fluoroscopic device): 1 mGy Kerma COMPLICATIONS: None PROCEDURE: Informed written consent was obtained from the patient's family after a discussion of the risks, benefits, and alternatives to treatment. Questions regarding the procedure were encouraged and answered. The right neck was prepped with chlorhexidine in a sterile fashion, and a sterile drape was applied covering the operative field. Maximum barrier sterile technique with sterile gowns and gloves were used for the procedure. A timeout was performed prior to the initiation of the procedure. A micropuncture kit was utilized to access the right internal jugular vein under direct, real-time ultrasound guidance after the overlying soft tissues were anesthetized with 1% lidocaine with epinephrine. Ultrasound image documentation was performed. The microwire was kinked to measure appropriate catheter length. A stiff glidewire was advanced to the level of the IVC. A 16 cm hemodialysis catheter was then placed over the wire. Final catheter positioning was confirmed and documented with a spot radiographic image. The catheter aspirates and flushes normally. The catheter was flushed with appropriate volume  heparin dwells. Dressings were applied. The patient tolerated the procedure well without immediate post procedural complication. IMPRESSION: Status post right IJ temporary hemodialysis  catheter. Signed, Dulcy Fanny. Dellia Nims, RPVI [facility] Radiology Electronically Signed   By: Corrie Mckusick D.O.   On: 05/15/2022 09:57   Alioune US Guide Vasc Access Right  Result Date: 05/15/2022 INDICATION: 56 year old male referred for temporary hemodialysis catheter for plasmapheresis purpose EXAM: IMAGE GUIDED TEMPORARY HEMODIALYSIS CATHETER MEDICATIONS: None ANESTHESIA/SEDATION: None FLUOROSCOPY: Radiation Exposure Index (as provided by the fluoroscopic device): 1 mGy Kerma COMPLICATIONS: None PROCEDURE: Informed written consent was obtained from the patient's family after a discussion of the risks, benefits, and alternatives to treatment. Questions regarding the procedure were encouraged and answered. The right neck was prepped with chlorhexidine in a sterile fashion, and a sterile drape was applied covering the operative field. Maximum barrier sterile technique with sterile gowns and gloves were used for the procedure. A timeout was performed prior to the initiation of the procedure. A micropuncture kit was utilized to access the right internal jugular vein under direct, real-time ultrasound guidance after the overlying soft tissues were anesthetized with 1% lidocaine with epinephrine. Ultrasound image documentation was performed. The microwire was kinked to measure appropriate catheter length. A stiff glidewire was advanced to the level of the IVC. A 16 cm hemodialysis catheter was then placed over the wire. Final catheter positioning was confirmed and documented with a spot radiographic image. The catheter aspirates and flushes normally. The catheter was flushed with appropriate volume heparin dwells. Dressings were applied. The patient tolerated the procedure well without immediate post procedural complication. IMPRESSION: Status post right IJ temporary hemodialysis catheter. Signed, Dulcy Fanny. Dellia Nims, RPVI [facility] Inter[facility] Radiology  Specialists [facility] Radiology Electronically Signed   By: Corrie Mckusick D.O.   On: 05/15/2022 09:57   DG Chest Port 1 View  Result Date: 05/13/2022 CLINICAL DATA:  Weakness EXAM: PORTABLE CHEST 1 VIEW COMPARISON:  None Available. FINDINGS: The heart size and mediastinal contours are within normal limits. Slightly low lung volumes. No focal airspace consolidation, pleural effusion, or pneumothorax. The visualized skeletal structures are unremarkable. IMPRESSION: No active disease. Electronically Signed   By: Davina Poke D.O.   On: 05/13/2022 13:03   DG Bone Survey Met  Result Date: 05/14/2022 CLINICAL DATA:  Multiple myeloma. EXAM: METASTATIC BONE SURVEY COMPARISON:  None Available. FINDINGS: Multiple small rounded lucencies are noted in the skull, the largest noted in the frontal region. Small rounded lucency is noted in proximal right humeral shaft. Small rounded lucency is noted in the proximal right femoral shaft. No other abnormality is noted in the skeleton. IMPRESSION: Small rounded lucencies are noted in the skull, proximal right humerus and proximal right femur concerning for multiple myeloma. Electronically Signed   By: Marijo Conception M.D.   On: 05/14/2022 15:34     LOS: 1 day   Antonieta Pert, MD [facility]  05/15/2022, 11:09 AM

## 2022-05-15 NOTE — Procedures (Signed)
Interventional Radiology Procedure Note  Procedure: Placement of a right IJ approach triple lumen HD temp cath. 16cm.  May be converted if need be. Tip is positioned at the superior cavoatrial junction and catheter is ready for immediate use.  Complications: None Recommendations:  - Ok to use - Do not submerge - Routine line care   Signed,  Dulcy Fanny. Earleen Newport, DO

## 2022-05-16 DIAGNOSIS — D649 Anemia, unspecified: Secondary | ICD-10-CM | POA: Diagnosis not present

## 2022-05-16 DIAGNOSIS — E1165 Type 2 diabetes mellitus with hyperglycemia: Secondary | ICD-10-CM

## 2022-05-16 DIAGNOSIS — E119 Type 2 diabetes mellitus without complications: Secondary | ICD-10-CM

## 2022-05-16 LAB — COMPREHENSIVE METABOLIC PANEL
ALT: 35 U/L (ref 0–44)
AST: 110 U/L — ABNORMAL HIGH (ref 15–41)
Albumin: 3 g/dL — ABNORMAL LOW (ref 3.5–5.0)
Alkaline Phosphatase: 37 U/L — ABNORMAL LOW (ref 38–126)
Anion gap: 8 (ref 5–15)
BUN: 29 mg/dL — ABNORMAL HIGH (ref 6–20)
CO2: 21 mmol/L — ABNORMAL LOW (ref 22–32)
Calcium: 9 mg/dL (ref 8.9–10.3)
Chloride: 100 mmol/L (ref 98–111)
Creatinine, Ser: 1 mg/dL (ref 0.61–1.24)
GFR, Estimated: >60 mL/min (ref >60)
Glucose, Bld: 400 mg/dL — ABNORMAL HIGH (ref 70–99)
Potassium: 3.9 mmol/L (ref 3.5–5.1)
Sodium: 129 mmol/L — ABNORMAL LOW (ref 135–145)
Total Bilirubin: 0.2 mg/dL — ABNORMAL LOW (ref 0.3–1.2)

## 2022-05-16 LAB — BASIC METABOLIC PANEL
Anion gap: 8 (ref 5–15)
Anion gap: 6 (ref 5–15)
BUN: 27 mg/dL — ABNORMAL HIGH (ref 6–20)
BUN: 26 mg/dL — ABNORMAL HIGH (ref 6–20)
CO2: 21 mmol/L — ABNORMAL LOW (ref 22–32)
CO2: 22 mmol/L (ref 22–32)
Calcium: 8.6 mg/dL — ABNORMAL LOW (ref 8.9–10.3)
Calcium: 8.6 mg/dL — ABNORMAL LOW (ref 8.9–10.3)
Chloride: 95 mmol/L — ABNORMAL LOW (ref 98–111)
Chloride: 96 mmol/L — ABNORMAL LOW (ref 98–111)
Creatinine, Ser: 0.86 mg/dL (ref 0.61–1.24)
Creatinine, Ser: 0.78 mg/dL (ref 0.61–1.24)
GFR, Estimated: >60 mL/min (ref >60)
GFR, Estimated: >60 mL/min (ref >60)
Glucose, Bld: 481 mg/dL — ABNORMAL HIGH (ref 70–99)
Glucose, Bld: 356 mg/dL — ABNORMAL HIGH (ref 70–99)
Potassium: 4.1 mmol/L (ref 3.5–5.1)
Potassium: 4.3 mmol/L (ref 3.5–5.1)
Sodium: 124 mmol/L — ABNORMAL LOW (ref 135–145)
Sodium: 124 mmol/L — ABNORMAL LOW (ref 135–145)

## 2022-05-16 LAB — CBC WITH DIFFERENTIAL/PLATELET
Abs Immature Granulocytes: 1.83 10*3/uL — ABNORMAL HIGH (ref 0.00–0.07)
Basophils Absolute: 0 10*3/uL (ref 0.0–0.1)
Basophils Relative: 0 %
Eosinophils Absolute: 0 10*3/uL (ref 0.0–0.5)
Eosinophils Relative: 0 %
HCT: 27.8 % — ABNORMAL LOW (ref 39.0–52.0)
Hemoglobin: 9 g/dL — ABNORMAL LOW (ref 13.0–17.0)
Immature Granulocytes: 15 %
Lymphocytes Relative: 21 %
Lymphs Abs: 2.6 10*3/uL (ref 0.7–4.0)
MCH: 29.4 pg (ref 26.0–34.0)
MCHC: 32.4 g/dL (ref 30.0–36.0)
MCV: 90.8 fL (ref 80.0–100.0)
Monocytes Absolute: 0.9 10*3/uL (ref 0.1–1.0)
Monocytes Relative: 7 %
Neutro Abs: 7 10*3/uL (ref 1.7–7.7)
Neutrophils Relative %: 57 %
Platelets: 52 10*3/uL — ABNORMAL LOW (ref 150–400)
RBC: 3.06 MIL/uL — ABNORMAL LOW (ref 4.22–5.81)
RDW: 19.3 % — ABNORMAL HIGH (ref 11.5–15.5)
WBC: 12.4 10*3/uL — ABNORMAL HIGH (ref 4.0–10.5)
nRBC: 21.6 % — ABNORMAL HIGH (ref 0.0–0.2)

## 2022-05-16 LAB — GLUCOSE, CAPILLARY
Glucose-Capillary: >600 mg/dL (ref 70–99)
Glucose-Capillary: >600 mg/dL (ref 70–99)
Glucose-Capillary: 423 mg/dL — ABNORMAL HIGH (ref 70–99)

## 2022-05-16 MED ORDER — INSULIN ASPART 100 UNIT/ML IJ SOLN
0.0000 [IU] | Freq: Every day | INTRAMUSCULAR | Status: DC
  Administered 2022-05-16: 5 [IU] via SUBCUTANEOUS
  Administered 2022-05-17: 4 [IU] via SUBCUTANEOUS

## 2022-05-16 MED ORDER — SODIUM CHLORIDE 0.9 % IV SOLN
INTRAVENOUS | Status: AC
  Filled 2022-05-16 (×4): qty 200

## 2022-05-16 MED ORDER — INSULIN GLARGINE-YFGN 100 UNIT/ML ~~LOC~~ SOLN
22.0000 [IU] | Freq: Every day | SUBCUTANEOUS | Status: DC
  Administered 2022-05-16: 22 [IU] via SUBCUTANEOUS
  Filled 2022-05-16 (×2): qty 0.22

## 2022-05-16 MED ORDER — INSULIN GLARGINE-YFGN 100 UNIT/ML ~~LOC~~ SOLN
15.0000 [IU] | Freq: Every day | SUBCUTANEOUS | Status: DC
  Administered 2022-05-16: 15 [IU] via SUBCUTANEOUS
  Filled 2022-05-16: qty 0.15

## 2022-05-16 MED ORDER — HEPARIN SODIUM (PORCINE) 1000 UNIT/ML IJ SOLN: 1000.0000 [IU] | Freq: Once | INTRAMUSCULAR | Status: DC

## 2022-05-16 MED ORDER — INSULIN ASPART 100 UNIT/ML IJ SOLN
0.0000 [IU] | Freq: Three times a day (TID) | INTRAMUSCULAR | Status: DC
  Administered 2022-05-17 – 2022-05-18 (×3): 5 [IU] via SUBCUTANEOUS
  Administered 2022-05-18 – 2022-05-19 (×4): 3 [IU] via SUBCUTANEOUS
  Administered 2022-05-20: 2 [IU] via SUBCUTANEOUS

## 2022-05-16 MED ORDER — GLUCERNA SHAKE PO LIQD
237.0000 mL | Freq: Two times a day (BID) | ORAL | Status: DC
  Administered 2022-05-17 – 2022-05-19 (×4): 237 mL via ORAL
  Filled 2022-05-16 (×8): qty 237

## 2022-05-16 MED ORDER — CALCIUM CARBONATE ANTACID 500 MG PO CHEW: 2.0000 | CHEWABLE_TABLET | ORAL | Status: AC

## 2022-05-16 MED ORDER — ACD FORMULA A 0.73-2.45-2.2 GM/100ML VI SOLN
1000.0000 mL | Status: DC
  Administered 2022-05-17: 1000 mL
  Filled 2022-05-16 (×2): qty 1000

## 2022-05-16 MED ORDER — ACETAMINOPHEN 325 MG PO TABS: 650.0000 mg | ORAL_TABLET | ORAL | Status: DC | PRN

## 2022-05-16 MED ORDER — DIPHENHYDRAMINE HCL 25 MG PO CAPS: 25.0000 mg | ORAL_CAPSULE | Freq: Four times a day (QID) | ORAL | Status: DC | PRN

## 2022-05-16 MED ORDER — INSULIN ASPART 100 UNIT/ML IJ SOLN
0.0000 [IU] | Freq: Three times a day (TID) | INTRAMUSCULAR | Status: DC
  Administered 2022-05-16: 15 [IU] via SUBCUTANEOUS

## 2022-05-16 MED ORDER — CALCIUM GLUCONATE-NACL 2-0.675 GM/100ML-% IV SOLN
2.0000 g | Freq: Once | INTRAVENOUS | Status: AC
  Filled 2022-05-16: qty 100

## 2022-05-16 NOTE — Progress Notes (Signed)
CRITICAL VALUE ALERT  Critical Value:  CBG > 600  Date & Time Notied:  05/16/2022 16:48pm  Provider Notified: MD Antonieta Pert   Orders Received/Actions taken: Give patient 15 unit insulin from sliding scale.

## 2022-05-16 NOTE — Progress Notes (Signed)
Stat lab were order for glucose level awaiting orders. MD notified.

## 2022-05-16 NOTE — Progress Notes (Signed)
Patient and patient's nephew instructed to collect all urine for the next 24 hours and let staff know whenever patient uses the urinal. Patient/pt's nephew verbalized understanding.

## 2022-05-16 NOTE — Progress Notes (Addendum)
PROGRESS NOTE Zyhir Dian Situ  JOA:[id number] DOB: 13-Oct-1966 DOA: 05/13/2022 PCP: Pcp, No   Brief Narrative/Hospital Course: 56 year old male with history of multiple myeloma who was lost to follow-up presented to the hospital with fatigue and dizziness he also complained of headache and some blurring of vision.  He was noted to have hemoglobin of 6 on admission with a platelets of 78,000.  Patient was admitted, transfused 2 units.  Patient seen by oncology Dr Irene Limbo concerned for hyperviscosity and needing plasmapheresis.  Patient was  transferred to [facility] for the same. Nephrology and interventional radiology were consulted for the same  for porta a cath      Subjective: Seen and examined.  He is resting comfortably able to speak some English and able to communicate denies any pain.  Appears pleasant Patient had plasmapheresis done yesterday.  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 Seen by oncology recommending plasmapheresis given concern for hyperviscosity especially after receiving blood transfusion, transferred to [facility] [place]-for Port-A-Cath and nephrology on board for plasmapheresis-completed first dose on 5/18 p.m-continue q. OD for 3-5 sessions per oncology/nephrology.  Monitor labs.  Symptomatic anemia:s/p 2 units PRBC, globin improved.  Keep above 7 g.  Recent Labs  Lab 05/13/22 1130 05/13/22 1639 05/14/22 0459 05/14/22 1315 05/16/22 0339  HGB 6.7* 6.0* 6.7* 7.1* 9.0*  HCT 19.9* 19.4* 21.4* 21.9* 27.8*    Hypercalcemia due to multiple myeloma oncology recommends bisphosphonates as outpatient.  Monitor calcium. Recent Labs  Lab  05/13/22 1130 05/13/22 1836 05/14/22 0459 05/15/22 1507 05/16/22 0339  K 3.7 3.8 4.4 4.3 3.9  CALCIUM 10.2 10.2 9.6 8.7* 9.0  PHOS  --  5.5* 3.7  --   --     Mild hyponatremia -sodium 126-129,monitor per nephrology in the setting of plasmapheresis  Recent Labs  Lab 05/13/22 1130 05/13/22 1836 05/14/22 0459 05/15/22 1507 05/16/22 0339  NA 128* 129* 133* 126* 129*    Severe hypoalbuminemia in the setting of multiple myeloma. Thrombocytopenia due to multiple myeloma.  Monitor.  Transfusion as per hematology. Recent Labs  Lab 05/13/22 1130 05/14/22 0459 05/16/22 0339  PLT 93* 78* 52*   New onset type 2 diabetes with uncontrolled hyperglycemia, A1c 8.2 blood sugar poorly controlled.  Start long-acting insulin 15 units and SSI.  Monitor. Recent Labs  Lab 05/13/22 1639  HGBA1C 8.2*    Body mass index is 29.04 kg/m.  Overweight. DVT prophylaxis: SCDs Start: 05/13/22 1609 Code Status:   Code Status: Full Code Family Communication: plan of care discussed with patient/daughter at bedside. Patient status is: Inpatient because of ongoing plasmapheresis Level of care: Med-Surg   Dispo: The patient is from: Home            Anticipated disposition: home  Mobility Assessment (last 72 hours)     Mobility Assessment     Row Name 05/15/22 2300 05/15/22 0800 05/14/22 1700 05/14/22 0804 05/13/22 2116   Does patient have an order for bedrest or is patient medically unstable No - Continue assessment No - Continue assessment No - Continue assessment No - Continue assessment No - Continue assessment   What is the highest level of mobility based on the progressive mobility assessment?  Level 6 (Walks independently in room and hall) - Balance while walking in room without assist - Complete Level 6 (Walks independently in room and hall) - Balance while walking in room without assist - Complete Level 6 (Walks independently in room and hall) - Balance while walking in room without assist -  Complete Level 6 (Walks independently in room and hall) - Balance while walking in room without assist - Complete Level 6 (Walks independently in room and hall) - Balance while walking in room without assist - Complete    Row Name 05/13/22 1700           Does patient have an order for bedrest or is patient medically unstable No - Continue assessment       What is the highest level of mobility based on the progressive mobility assessment? Level 6 (Walks independently in room and hall) - Balance while walking in room without assist - Complete                 Objective: Vitals last 24 hrs: Vitals:   05/15/22 2030 05/15/22 2111 05/16/22 0501 05/16/22 0940  BP: (!) 151/86 (!) 147/85 137/88 (!) 146/90  Pulse:  96 86 86  Resp: (!) 33 18 18 (!) 25  Temp:  98 F (36.7 C) 98.3 F (36.8 C) 97.6 F (36.4 C)  TempSrc:  Oral    SpO2:  95% 96% 97%  Weight:  81.6 kg    Height:       Weight change:   Physical Examination: General exam: AA oriented, pleasant,older than stated age, weak appearing. HEENT:Oral mucosa moist, Ear/Nose WNL grossly, dentition normal. Respiratory system: bilaterally diminished,no use of accessory muscle Cardiovascular system: S1 & S2 +, No JVD,. Gastrointestinal system: Abdomen soft,NT,ND, BS+ Nervous System:Alert, awake, moving extremities and grossly nonfocal Extremities: edema neg,distal peripheral pulses palpable.  Skin: No rashes,no icterus. MSK: Normal muscle bulk,tone, power Right IJ on the neck + with dressing intact  Medications reviewed:  Scheduled Meds:  Chlorhexidine Gluconate Cloth  6 each Topical Daily   dexamethasone  40 mg Oral Daily   feeding supplement  237 mL Oral BID BM   pantoprazole  40 mg Oral Daily   Continuous Infusions:    Diet Order             Diet regular Room service appropriate? Yes; Fluid consistency: Thin  Diet effective now                   Intake/Output Summary (Last 24 hours) at 05/16/2022 1123 Last data  filed at 05/16/2022 0643 Gross per 24 hour  Intake 360 ml  Output 600 ml  Net -240 ml   Net IO Since Admission: 1,375.33 mL [05/16/22 1123]  Wt Readings from Last 3 Encounters:  05/15/22 81.6 kg  01/11/21 80.2 kg  01/01/21 79.5 kg     Unresulted Labs (From admission, onward)     Start     Ordered   05/16/22 5916  Basic metabolic panel  Daily,   R      05/15/22 0830   05/16/22 0500  CBC  Daily,   R      05/15/22 0830   05/16/22 0500  IgG, IgA, IgM  Daily,   R      05/15/22 1604   05/16/22 0500  Viscosity, serum  Tomorrow morning,   R        05/15/22 1604   05/16/22 0339  Pathologist smear review  Once,  R        05/16/22 0339   05/14/22 1114  Viscosity, serum  Once,   R        05/14/22 1114   05/14/22 0000  Pretreatment RBC phenotype  R       Comments: Obtain prior to daratumumab treatment.   Question:  Medication to be given:  Answer:  Daratumumab   05/14/22 1127   05/13/22 1556  Multiple Myeloma Panel (SPEP&IFE w/QIG)  Once,   R        05/13/22 1555   05/13/22 1556  UPEP/UIFE/Light Chains/TP, 24-Hr Ur  Once,   R        05/13/22 1555   05/13/22 1518  Sodium, urine, random  Once,   R        05/13/22 1518   05/13/22 1518  Osmolality, urine  Once,   R        05/13/22 1518          Data Reviewed: I have personally reviewed following labs and imaging studies CBC: Recent Labs  Lab 05/13/22 1130 05/13/22 1639 05/14/22 0459 05/14/22 1315 05/16/22 0339  WBC 10.3  --  8.9  --  12.4*  NEUTROABS 4.1  --   --   --  7.0  HGB 6.7* 6.0* 6.7* 7.1* 9.0*  HCT 19.9* 19.4* 21.4* 21.9* 27.8*  MCV 93.0  --  91.8  --  90.8  PLT 93*  --  78*  --  52*   Basic Metabolic Panel: Recent Labs  Lab 05/13/22 1130 05/13/22 1836 05/14/22 0459 05/15/22 1507 05/16/22 0339  NA 128* 129* 133* 126* 129*  K 3.7 3.8 4.4 4.3 3.9  CL 97* 99 103 97* 100  CO2 26 23 23 23  21*  GLUCOSE 173* 183* 160* 409* 400*  BUN 22* 22* 22* 27* 29*  CREATININE 0.97 1.02 0.90 1.00 1.00  CALCIUM 10.2  10.2 9.6 8.7* 9.0  PHOS  --  5.5* 3.7  --   --    GFR: Estimated Creatinine Clearance: 83.7 mL/min (by C-G formula based on SCr of 1 mg/dL). Liver Function Tests: Recent Labs  Lab 05/13/22 1130 05/13/22 1836 05/14/22 0459 05/16/22 0339  AST 29  --  30 110*  ALT 42  --  41 35  ALKPHOS 43  --  43 37*  BILITOT 0.4  --  0.5 0.2*  PROT >12.0*  --  >12.0* RESULTS UNAVAILABLE DUE TO INTERFERING SUBSTANCE  ALBUMIN 1.6* 1.6* 1.5* 3.0*   No results for input(s): LIPASE, AMYLASE in the last 168 hours. No results for input(s): AMMONIA in the last 168 hours. Coagulation Profile: No results for input(s): INR, PROTIME in the last 168 hours. BNP (last 3 results) No results for input(s): PROBNP in the last 8760 hours. HbA1C: Recent Labs    05/13/22 1639  HGBA1C 8.2*   CBG: No results for input(s): GLUCAP in the last 168 hours. Lipid Profile: No results for input(s): CHOL, HDL, LDLCALC, TRIG, CHOLHDL, LDLDIRECT in the last 72 hours. Thyroid Function Tests: No results for input(s): TSH, T4TOTAL, FREET4, T3FREE, THYROIDAB in the last 72 hours. Sepsis Labs: No results for input(s): PROCALCITON, LATICACIDVEN in the last 168 hours.  No results found for this or any previous visit (from the past 240 hour(s)).  Antimicrobials: Anti-infectives (From admission, onward)    None      Culture/Microbiology No results found for: SDES, SPECREQUEST, CULT, REPTSTATUS  Other culture-see note  Radiology Studies: Zaion Fluoro Guide CV Line Right  Result Date: 05/15/2022 INDICATION: 56 year old male referred for temporary hemodialysis catheter for plasmapheresis purpose EXAM: IMAGE GUIDED TEMPORARY HEMODIALYSIS CATHETER MEDICATIONS: None ANESTHESIA/SEDATION: None FLUOROSCOPY: Radiation Exposure Index (as provided by the fluoroscopic device): 1 mGy Kerma COMPLICATIONS: None PROCEDURE: Informed written consent was obtained from the patient's family after a discussion of the risks, benefits, and  alternatives to treatment. Questions regarding the procedure were encouraged and answered. The right neck was prepped with chlorhexidine in a sterile fashion, and a sterile drape was applied covering the operative field. Maximum barrier sterile technique with sterile gowns and gloves were used for the procedure. A timeout was performed prior to the initiation of the procedure. A micropuncture kit was utilized to access the right internal jugular vein under direct, real-time ultrasound guidance after the overlying soft tissues were anesthetized with 1% lidocaine with epinephrine. Ultrasound image documentation was performed. The microwire was kinked to measure appropriate catheter length. A stiff glidewire was advanced to the level of the IVC. A 16 cm hemodialysis catheter was then placed over the wire. Final catheter positioning was confirmed and documented with a spot radiographic image. The catheter aspirates and flushes normally. The catheter was flushed with appropriate volume heparin dwells. Dressings were applied. The patient tolerated the procedure well without immediate post procedural complication. IMPRESSION: Status post right IJ temporary hemodialysis catheter. Signed, Dulcy Fanny. Dellia Nims, RPVI [facility] Radiology Electronically Signed   By: Corrie Mckusick D.O.   On: 05/15/2022 09:57   Maddyx US Guide Vasc Access Right  Result Date: 05/15/2022 INDICATION: 56 year old male referred for temporary hemodialysis catheter for plasmapheresis purpose EXAM: IMAGE GUIDED TEMPORARY HEMODIALYSIS CATHETER MEDICATIONS: None ANESTHESIA/SEDATION: None FLUOROSCOPY: Radiation Exposure Index (as provided by the fluoroscopic device): 1 mGy Kerma COMPLICATIONS: None PROCEDURE: Informed written consent was obtained from the patient's family after a discussion of the risks, benefits, and alternatives to treatment. Questions regarding the procedure were encouraged and answered.  The right neck was prepped with chlorhexidine in a sterile fashion, and a sterile drape was applied covering the operative field. Maximum barrier sterile technique with sterile gowns and gloves were used for the procedure. A timeout was performed prior to the initiation of the procedure. A micropuncture kit was utilized to access the right internal jugular vein under direct, real-time ultrasound guidance after the overlying soft tissues were anesthetized with 1% lidocaine with epinephrine. Ultrasound image documentation was performed. The microwire was kinked to measure appropriate catheter length. A stiff glidewire was advanced to the level of the IVC. A 16 cm hemodialysis catheter was then placed over the wire. Final catheter positioning was confirmed and documented with a spot radiographic image. The catheter aspirates and flushes normally. The catheter was flushed with appropriate volume heparin dwells. Dressings were applied. The patient tolerated the procedure well without immediate post procedural complication. IMPRESSION: Status post right IJ temporary hemodialysis catheter. Signed, Dulcy Fanny. Dellia Nims, RPVI [facility] Radiology Electronically Signed   By: Corrie Mckusick D.O.   On: 05/15/2022 09:57   DG Bone Survey Met  Result Date: 05/14/2022 CLINICAL DATA:  Multiple myeloma. EXAM: METASTATIC BONE SURVEY COMPARISON:  None Available. FINDINGS: Multiple small rounded lucencies are noted in the skull, the largest noted in the frontal region. Small rounded lucency is noted in proximal right humeral shaft. Small rounded lucency is noted in the proximal right femoral shaft. No other abnormality is noted in the skeleton. IMPRESSION: Small rounded lucencies are noted in the skull, proximal  right humerus and proximal right femur concerning for multiple myeloma. Electronically Signed   By: Marijo Conception M.D.   On: 05/14/2022 15:34     LOS: 2 days   Antonieta Pert,  MD [facility]  05/16/2022, 11:23 AM

## 2022-05-16 NOTE — Progress Notes (Signed)
Brief Hematology Note:  I called the hemodialysis unit and spoke with charge nurse, Wells Guiles.  Advised the plan for for plasmapheresis on Saturday and again on Monday.  I requested an early plasmapheresis appointment on Monday with the hopes that the patient can be discharged on that date.  He has a chemotherapy education class scheduled that afternoon.  Charge nurse will attempt to accommodate an earlier appointment on Monday.  Discussed with charge nurse, and recommendation is for patient to stay in the hospital until completion of pheresis on Monday.  Following positive pheresis on Monday, temporary dialysis catheter can be removed.  If possible, [facility] can also place Port-A-Cath at that time if their schedule can accommodate.  If not, we can have this placed as an outpatient.  Labs reviewed.  Hemoglobin is 9 today and platelets are slightly lower at 52,000.  He does not need transfusion today.  Recommend close monitoring of daily CBC.  Myeloma panel still pending.  Serum viscosity level from 5/17 and this morning still pending.  Repeat immunoglobulins from this morning pending.   PLAN: -Completed first session of plasmapheresis 5/18 and plan for repeat plasmapheresis on Saturday 5/20 and again on Monday 5/22.  Thereafter, plasmapheresis can be discontinued.   -Please have temporary dialysis catheter removed following plasmapheresis on 5/22.  If [facility] can accommodate, would recommend Port-A-Cath placement on Monday prior to discharge.  If their schedule does not allow for this, we can arrange to have a Port-A-Cath placed as an outpatient. -I have requested an early appointment on Monday 5/22 with the hopes that he can be discharged after that so that he can attend his chemotherapy education appointment which is scheduled later that day.  If timing does not allow, then we will reschedule his chemotherapy education appointment. -Continue dexamethasone 40 mg p.o. daily for a total of 4 doses.  Due to complete  on Saturday.  Continue PPI for GI prophylaxis. -Will need PET scan as outpatient. -We will follow-up on outstanding labs when they become available.  24-hour urine for UPEP currently being collected. -Recommend PRBC transfusion for hemoglobin less than 7 or if symptomatic. -The patient has been set up for systemic chemotherapy in our office with daratumumab, Velcade, Revlimid, dexamethasone on 5/23.  He has a chemotherapy education appointment scheduled for 5/22.  Future Appointments  Date Time Provider [place]  05/19/2022  4:00 PM [facility] CHEMO EDU [facility] None  05/20/2022 12:30 PM [facility] LAB [facility] None  05/20/2022  1:00 PM Brunetta Genera, MD [facility] None  05/21/2022  7:30 AM [facility] INFUSION [facility] None  06/16/2022  2:30 PM Gildardo Pounds, NP [facility] None   Mikey Bussing, DNP, AGPCNP-BC, AOCNP

## 2022-05-16 NOTE — Consult Note (Signed)
Chief Complaint: Patient was seen in consultation today for removal non tunneled plasmapheresis catheter and placement of Port a cath Chief Complaint  Patient presents with   Abnormal Lab   at the request of Myrtha Mantis NP  Supervising Physician: Michaelle Birks  Patient Status: [facility] - In-pt  History of Present Illness: Donald Suarez is a 56 y.o. male   Multiple Myeloma-- no remission achieved per Oncology note Hyperviscosity syndrome Giorgio placed plasmapheresis temp cath 5/18 Plans are to use this cath over weekend and Monday Request made for removal of plasmapheresis cath after last treatment- and place [facility] same time Plan is for start chemotherapy asap  PLAN: -Completed first session of plasmapheresis 5/18 and plan for repeat plasmapheresis on Saturday 5/20 and again on Monday 5/22.  Thereafter, plasmapheresis can be discontinued.   -Please have temporary dialysis catheter removed following plasmapheresis on 5/22.  If [facility] can accommodate, would recommend Port-A-Cath placement on Monday prior to discharge  Request for plasmapheresis cath removal/Port a cath placement in [place] Monday 5/22  Past Medical History:  Diagnosis Date   Anemia     Past Surgical History:  Procedure Laterality Date   APPENDECTOMY     Connell FLUORO GUIDE CV LINE RIGHT  05/15/2022   Gerber US GUIDE VASC ACCESS RIGHT  05/15/2022    Allergies: Patient has no known allergies.  Medications: Prior to Admission medications   Medication Sig Start Date End Date Taking? Authorizing Provider  ibuprofen (ADVIL) 200 MG tablet Take 200 mg by mouth 2 (two) times daily as needed (pain).   Yes [provider]  zolpidem (AMBIEN) 10 MG tablet Take 10 mg by mouth at bedtime as needed for sleep. 05/12/22  Yes [provider]     History reviewed. No pertinent family history.  Social History   Socioeconomic History   Marital status: Married    Spouse name: Not on file   Number of children: Not on file    Years of education: Not on file   Highest education level: Not on file  Occupational History   Not on file  Tobacco Use   Smoking status: Never   Smokeless tobacco: Never  Vaping Use   Vaping Use: Never used  Substance and Sexual Activity   Alcohol use: Never   Drug use: Never   Sexual activity: Not on file  Other Topics Concern   Not on file  Social History Narrative   Not on file   Social Determinants of Health   Financial Resource Strain: Not on file  Food Insecurity: Not on file  Transportation Needs: Not on file  Physical Activity: Not on file  Stress: Not on file  Social Connections: Not on file    Review of Systems: A 12 point ROS discussed and pertinent positives are indicated in the HPI above.  All other systems are negative.   Vital Signs: BP (!) 146/90 (BP Location: Right Arm)   Pulse 86   Temp 97.6 F (36.4 C)   Resp (!) 25   Ht 5' 6"  (1.676 m)   Wt 179 lb 14.3 oz (81.6 kg)   SpO2 97%   BMI 29.04 kg/m   Physical Exam Vitals reviewed.  Constitutional:      Comments: Pt speaks broken [place] but understands English very well He is able to sign his own consent Family also in room Answered all questions to satisfaction  HENT:     Mouth/Throat:     Mouth: Mucous membranes are moist.  Cardiovascular:     Rate and Rhythm: Normal rate and regular rhythm.     Heart sounds: Normal heart sounds.  Pulmonary:     Effort: Pulmonary effort is normal.     Breath sounds: Normal breath sounds.  Abdominal:     Palpations: Abdomen is soft.  Musculoskeletal:        General: Normal range of motion.  Skin:    General: Skin is warm.  Neurological:     Mental Status: He is alert and oriented to person, place, and time.  Psychiatric:        Behavior: Behavior normal.    Imaging: Cailen Fluoro Guide CV Line Right  Result Date: 05/15/2022 INDICATION: 56 year old male referred for temporary hemodialysis catheter for plasmapheresis purpose EXAM: IMAGE GUIDED  TEMPORARY HEMODIALYSIS CATHETER MEDICATIONS: None ANESTHESIA/SEDATION: None FLUOROSCOPY: Radiation Exposure Index (as provided by the fluoroscopic device): 1 mGy Kerma COMPLICATIONS: None PROCEDURE: Informed written consent was obtained from the patient's family after a discussion of the risks, benefits, and alternatives to treatment. Questions regarding the procedure were encouraged and answered. The right neck was prepped with chlorhexidine in a sterile fashion, and a sterile drape was applied covering the operative field. Maximum barrier sterile technique with sterile gowns and gloves were used for the procedure. A timeout was performed prior to the initiation of the procedure. A micropuncture kit was utilized to access the right internal jugular vein under direct, real-time ultrasound guidance after the overlying soft tissues were anesthetized with 1% lidocaine with epinephrine. Ultrasound image documentation was performed. The microwire was kinked to measure appropriate catheter length. A stiff glidewire was advanced to the level of the IVC. A 16 cm hemodialysis catheter was then placed over the wire. Final catheter positioning was confirmed and documented with a spot radiographic image. The catheter aspirates and flushes normally. The catheter was flushed with appropriate volume heparin dwells. Dressings were applied. The patient tolerated the procedure well without immediate post procedural complication. IMPRESSION: Status post right IJ temporary hemodialysis catheter. Signed, Dulcy Fanny. Dellia Nims, RPVI [facility] Radiology Electronically Signed   By: Corrie Mckusick D.O.   On: 05/15/2022 09:57   [place] US Guide Vasc Access Right  Result Date: 05/15/2022 INDICATION: 56 year old male referred for temporary hemodialysis catheter for plasmapheresis purpose EXAM: IMAGE GUIDED TEMPORARY HEMODIALYSIS CATHETER MEDICATIONS: None ANESTHESIA/SEDATION: None FLUOROSCOPY:  Radiation Exposure Index (as provided by the fluoroscopic device): 1 mGy Kerma COMPLICATIONS: None PROCEDURE: Informed written consent was obtained from the patient's family after a discussion of the risks, benefits, and alternatives to treatment. Questions regarding the procedure were encouraged and answered. The right neck was prepped with chlorhexidine in a sterile fashion, and a sterile drape was applied covering the operative field. Maximum barrier sterile technique with sterile gowns and gloves were used for the procedure. A timeout was performed prior to the initiation of the procedure. A micropuncture kit was utilized to access the right internal jugular vein under direct, real-time ultrasound guidance after the overlying soft tissues were anesthetized with 1% lidocaine with epinephrine. Ultrasound image documentation was performed. The microwire was kinked to measure appropriate catheter length. A stiff glidewire was advanced to the level of the IVC. A 16 cm hemodialysis catheter was then placed over the wire. Final catheter positioning was confirmed and documented with a spot radiographic image. The catheter aspirates and flushes normally. The catheter was flushed with appropriate volume heparin dwells. Dressings were applied. The patient tolerated the procedure well without immediate post  procedural complication. IMPRESSION: Status post right IJ temporary hemodialysis catheter. Signed, Dulcy Fanny. Dellia Nims, RPVI [facility] Radiology Electronically Signed   By: Corrie Mckusick D.O.   On: 05/15/2022 09:57   DG Chest Port 1 View  Result Date: 05/13/2022 CLINICAL DATA:  Weakness EXAM: PORTABLE CHEST 1 VIEW COMPARISON:  None Available. FINDINGS: The heart size and mediastinal contours are within normal limits. Slightly low lung volumes. No focal airspace consolidation, pleural effusion, or pneumothorax. The visualized skeletal structures are unremarkable.  IMPRESSION: No active disease. Electronically Signed   By: Davina Poke D.O.   On: 05/13/2022 13:03   DG Bone Survey Met  Result Date: 05/14/2022 CLINICAL DATA:  Multiple myeloma. EXAM: METASTATIC BONE SURVEY COMPARISON:  None Available. FINDINGS: Multiple small rounded lucencies are noted in the skull, the largest noted in the frontal region. Small rounded lucency is noted in proximal right humeral shaft. Small rounded lucency is noted in the proximal right femoral shaft. No other abnormality is noted in the skeleton. IMPRESSION: Small rounded lucencies are noted in the skull, proximal right humerus and proximal right femur concerning for multiple myeloma. Electronically Signed   By: Marijo Conception M.D.   On: 05/14/2022 15:34    Labs:  CBC: Recent Labs    05/13/22 1130 05/13/22 1639 05/14/22 0459 05/14/22 1315 05/16/22 0339  WBC 10.3  --  8.9  --  12.4*  HGB 6.7* 6.0* 6.7* 7.1* 9.0*  HCT 19.9* 19.4* 21.4* 21.9* 27.8*  PLT 93*  --  78*  --  52*    COAGS: No results for input(s): INR, APTT in the last 8760 hours.  BMP: Recent Labs    05/13/22 1836 05/14/22 0459 05/15/22 1507 05/16/22 0339  NA 129* 133* 126* 129*  K 3.8 4.4 4.3 3.9  CL 99 103 97* 100  CO2 23 23 23  21*  GLUCOSE 183* 160* 409* 400*  BUN 22* 22* 27* 29*  CALCIUM 10.2 9.6 8.7* 9.0  CREATININE 1.02 0.90 1.00 1.00  GFRNONAA >60 >60 >60 >60    LIVER FUNCTION TESTS: Recent Labs    05/13/22 1130 05/13/22 1836 05/14/22 0459 05/16/22 0339  BILITOT 0.4  --  0.5 0.2*  AST 29  --  30 110*  ALT 42  --  41 35  ALKPHOS 43  --  43 37*  PROT >12.0*  --  >12.0* RESULTS UNAVAILABLE DUE TO INTERFERING SUBSTANCE  ALBUMIN 1.6* 1.6* 1.5* 3.0*    TUMOR MARKERS: No results for input(s): AFPTM, CEA, CA199, CHROMGRNA in the last 8760 hours.  Assessment and Plan:  Multiple Myeloma no remission reached-- hyperviscosity syndrome Plasmapheresis to continue through weekend and last treatment on Monday Scheduled for  plasmapheresis catheter removal 5/22 in [place] and placement of PAC same time Risks and benefits of image guided port-a-catheter placement was discussed with the patient including, but not limited to bleeding, infection, pneumothorax, or fibrin sheath development and need for additional procedures.  All of the patient's questions were answered, patient is agreeable to proceed. Consent signed and in chart.   Thank you for this interesting consult.  I greatly enjoyed meeting Victorio Foti and look forward to participating in their care.  A copy of this report was sent to the requesting provider on this date.  Electronically Signed: Lavonia Drafts, PA-C 05/16/2022, 1:14 PM   I spent a total of 20 Minutes    in face to face in clinical consultation, greater than 50% of which was  counseling/coordinating care for Plasmapheresis catheter removal and Port a cath placement

## 2022-05-16 NOTE — Progress Notes (Incomplete)
Patient's CBG=423. C. Hall,DO notified via secure chat

## 2022-05-16 NOTE — Progress Notes (Addendum)
Noted that lab blood sugars have been 409-400 mg/dl.   Recommend starting Semglee 15 units daily, Novolog SENSITIVE correction scale TID, and check blood sugars TID.  Harvel Ricks RN BSN CDE Diabetes Coordinator Pager: [phone]  8am-5pm

## 2022-05-17 DIAGNOSIS — D759 Disease of blood and blood-forming organs, unspecified: Secondary | ICD-10-CM | POA: Diagnosis not present

## 2022-05-17 DIAGNOSIS — D696 Thrombocytopenia, unspecified: Secondary | ICD-10-CM | POA: Diagnosis not present

## 2022-05-17 DIAGNOSIS — D649 Anemia, unspecified: Secondary | ICD-10-CM | POA: Diagnosis not present

## 2022-05-17 DIAGNOSIS — C9000 Multiple myeloma not having achieved remission: Secondary | ICD-10-CM | POA: Diagnosis not present

## 2022-05-17 LAB — GLUCOSE, CAPILLARY
Glucose-Capillary: 277 mg/dL — ABNORMAL HIGH (ref 70–99)
Glucose-Capillary: 269 mg/dL — ABNORMAL HIGH (ref 70–99)
Glucose-Capillary: 493 mg/dL — ABNORMAL HIGH (ref 70–99)
Glucose-Capillary: 327 mg/dL — ABNORMAL HIGH (ref 70–99)

## 2022-05-17 LAB — BASIC METABOLIC PANEL
Anion gap: 8 (ref 5–15)
BUN: 27 mg/dL — ABNORMAL HIGH (ref 6–20)
CO2: 22 mmol/L (ref 22–32)
Calcium: 8.7 mg/dL — ABNORMAL LOW (ref 8.9–10.3)
Chloride: 99 mmol/L (ref 98–111)
Creatinine, Ser: 0.75 mg/dL (ref 0.61–1.24)
GFR, Estimated: >60 mL/min (ref >60)
Glucose, Bld: 286 mg/dL — ABNORMAL HIGH (ref 70–99)
Potassium: 3.8 mmol/L (ref 3.5–5.1)
Sodium: 129 mmol/L — ABNORMAL LOW (ref 135–145)

## 2022-05-17 LAB — CBC
HCT: 28.9 % — ABNORMAL LOW (ref 39.0–52.0)
Hemoglobin: 9.6 g/dL — ABNORMAL LOW (ref 13.0–17.0)
MCH: 29.6 pg (ref 26.0–34.0)
MCHC: 33.2 g/dL (ref 30.0–36.0)
MCV: 89.2 fL (ref 80.0–100.0)
Platelets: 53 10*3/uL — ABNORMAL LOW (ref 150–400)
RBC: 3.24 MIL/uL — ABNORMAL LOW (ref 4.22–5.81)
RDW: 18.6 % — ABNORMAL HIGH (ref 11.5–15.5)
WBC: 10.1 10*3/uL (ref 4.0–10.5)
nRBC: 50.6 % — ABNORMAL HIGH (ref 0.0–0.2)

## 2022-05-17 LAB — IGG, IGA, IGM
IgA: 8 mg/dL — ABNORMAL LOW (ref 90–386)
IgG (Immunoglobin G), Serum: 6000 mg/dL — ABNORMAL HIGH (ref 603–1613)
IgM (Immunoglobulin M), Srm: <5 mg/dL — ABNORMAL LOW (ref 20–172)

## 2022-05-17 MED ORDER — CALCIUM GLUCONATE-NACL 2-0.675 GM/100ML-% IV SOLN
INTRAVENOUS | Status: AC
  Administered 2022-05-17: 2000 mg via INTRAVENOUS
  Filled 2022-05-17: qty 100

## 2022-05-17 MED ORDER — INSULIN ASPART 100 UNIT/ML IJ SOLN
17.0000 [IU] | Freq: Once | INTRAMUSCULAR | Status: AC
  Administered 2022-05-17: 17 [IU] via SUBCUTANEOUS

## 2022-05-17 MED ORDER — INSULIN ASPART 100 UNIT/ML IJ SOLN
5.0000 [IU] | Freq: Three times a day (TID) | INTRAMUSCULAR | Status: DC
  Administered 2022-05-17 – 2022-05-20 (×8): 5 [IU] via SUBCUTANEOUS

## 2022-05-17 MED ORDER — CALCIUM CARBONATE ANTACID 500 MG PO CHEW
CHEWABLE_TABLET | ORAL | Status: AC
  Administered 2022-05-17: 400 mg via ORAL
  Filled 2022-05-17: qty 1

## 2022-05-17 MED ORDER — INSULIN GLARGINE-YFGN 100 UNIT/ML ~~LOC~~ SOLN
25.0000 [IU] | Freq: Every day | SUBCUTANEOUS | Status: DC
  Administered 2022-05-17 – 2022-05-18 (×2): 25 [IU] via SUBCUTANEOUS
  Filled 2022-05-17 (×3): qty 0.25

## 2022-05-17 MED ORDER — HEPARIN SODIUM (PORCINE) 1000 UNIT/ML IJ SOLN
INTRAMUSCULAR | Status: AC
  Filled 2022-05-17: qty 3

## 2022-05-17 NOTE — Progress Notes (Signed)
PROGRESS NOTE Donald Suarez  WGY:[id number] DOB: 1966-11-06 DOA: 05/13/2022 PCP: Pcp, No   Brief Narrative/Hospital Course: 56 year old male with history of multiple myeloma who was lost to follow-up presented to the hospital with fatigue and dizziness he also complained of headache and some blurring of vision.  He was noted to have hemoglobin of 6 on admission with a platelets of 78,000.  Patient was admitted, transfused 2 units.  Patient seen by oncology Dr Irene Limbo concerned for hyperviscosity and needing plasmapheresis.  Patient was  transferred to [facility] for the same. Nephrology and interventional radiology were consulted for the same  for porta a cath-completed first session of plasmapheresis 5/18 and repeating plasmapheresis 5/20 and 5/22.  Patient was found to have uncontrolled hyperglycemia A1c 8s, seen by diabetic coordinator placed on insulin.      Subjective: Seen and examined this morning.  Daughter at the bedside.  Patient has no new complaints. Had uncontrolled hyperglycemia regimental 600 yesterday and improving on insulin regimen Daughter unaware about diagnosis of diabetes  Assessment and Plan: Principal Problem:   Symptomatic anemia Active Problems:   Multiple myeloma not having achieved remission (HCC)   Hyponatremia   Hyperglycemia   Thrombocytopenia (HCC)   Hypoalbuminemia   Hyperviscosity   New onset type 2 diabetes mellitus ([place])   Multiple myeloma not having achieved remission Suspected hyperviscosity syndrome: He had admission to [facility] for hyperviscosity and had plasmapheresis done a year ago. Seen by oncology recommending plasmapheresis given concern for hyperviscosity especially after receiving blood transfusion, transferred to [facility] [place]-for Port-A-Cath and nephrology on board. completed first session of plasmapheresis 5/18 and repeating plasmapheresis 5/20 and 5/22.  Will need PET as outpatient, on dexamethasone 40 mg daily for 4 doses total to complete Saturday.   Anticipate discharge on 5/22 Monday-and already has appointment in the hem-onc office 5/22  Symptomatic anemia:s/p 2 units PRBC, improved and stable.  Monitor  Recent Labs  Lab 05/13/22 1639 05/14/22 0459 05/14/22 1315 05/16/22 0339 05/17/22 0425  HGB 6.0* 6.7* 7.1* 9.0* 9.6*  HCT 19.4* 21.4* 21.9* 27.8* 28.9*    Hypercalcemia due to multiple myeloma oncology recommends bisphosphonates as outpatient.  Monitor calcium. Recent Labs  Lab 05/13/22 1836 05/14/22 0459 05/15/22 1507 05/16/22 0339 05/16/22 1926 05/16/22 2223 05/17/22 0425  K 3.8 4.4 4.3 3.9 4.1 4.3 3.8  CALCIUM 10.2 9.6 8.7* 9.0 8.6* 8.6* 8.7*  PHOS 5.5* 3.7  --   --   --   --   --     Mild hyponatremia -also component of pseudohyponatremia in the setting of hyperglycemia.  Monitor  Recent Labs  Lab 05/15/22 1507 05/16/22 0339 05/16/22 1926 05/16/22 2223 05/17/22 0425  NA 126* 129* 124* 124* 129*    Severe hypoalbuminemia in the setting of multiple myeloma. Thrombocytopenia due to multiple myeloma.  Monitor closely Recent Labs  Lab 05/13/22 1130 05/14/22 0459 05/16/22 0339 05/17/22 0425  PLT 93* 78* 52* 53*   New onset type 2 diabetes with uncontrolled hyperglycemia, A1c 8.2 blood sugar poorly controlled in 600s-also due to patient seen getting dexamethasone high-dose-last dose today.continue long-acting insulin, SSI, add Premeal insulin and monitor blood glucose hopefully as weaning of his steroids will be able to figure out the homeless for his insulin next day or so  Recent Labs  Lab 05/13/22 1639 05/16/22 1646 05/16/22 1733 05/16/22 2042  GLUCAP  --  >600* >600* 423*  HGBA1C 8.2*  --   --   --     Body mass index is 26.39  kg/m.  Overweight. DVT prophylaxis: SCDs Start: 05/13/22 1609 Code Status:   Code Status: Full Code Family Communication: plan of care discussed with patient/daughter at bedside. Patient status is: Inpatient because of ongoing plasmapheresis Level of care: Med-Surg    Dispo: The patient is from: Home            Anticipated disposition: home  Mobility Assessment (last 72 hours)     Mobility Assessment     Row Name 05/16/22 1926 05/15/22 2300 05/15/22 0800 05/14/22 1700     Does patient have an order for bedrest or is patient medically unstable No - Continue assessment No - Continue assessment No - Continue assessment No - Continue assessment    What is the highest level of mobility based on the progressive mobility assessment? Level 6 (Walks independently in room and hall) - Balance while walking in room without assist - Complete Level 6 (Walks independently in room and hall) - Balance while walking in room without assist - Complete Level 6 (Walks independently in room and hall) - Balance while walking in room without assist - Complete Level 6 (Walks independently in room and hall) - Balance while walking in room without assist - Complete              Objective: Vitals last 24 hrs: Vitals:   05/16/22 2044 05/17/22 0500 05/17/22 0548 05/17/22 0952  BP: 139/88  126/81 119/83  Pulse: 83  79 81  Resp: _0 Temp: 98 F (36.7 C)  98.2 F (36.8 C) 98.1 F (36.7 C)  TempSrc:   Oral   SpO2: 100%  94% 96%  Weight:  74.2 kg    Height:       Weight change: -7.437 kg  Physical Examination: General exam: AA oriented,older than stated age, weak appearing. HEENT:Oral mucosa moist, Ear/Nose WNL grossly, dentition normal. Respiratory system: bilaterally diminished,no use of accessory muscle Cardiovascular system: S1 & S2 +, No JVD,. Gastrointestinal system: Abdomen soft,NT,ND, BS+ Nervous System:Alert, awake, moving extremities and grossly nonfocal Extremities: edema neg,distal peripheral pulses palpable.  Skin: No rashes,no icterus. MSK: Normal muscle bulk,tone, power Right IJ catheter with present intact dressing  Medications reviewed:  Scheduled Meds:  calcium carbonate       calcium carbonate  2 tablet Oral Q3H   Chlorhexidine  Gluconate Cloth  6 each Topical Daily   feeding supplement (GLUCERNA SHAKE)  237 mL Oral BID BM   heparin sodium (porcine)  1,000 Units Intracatheter Once   insulin aspart  0-15 Units Subcutaneous TID WC   insulin aspart  0-5 Units Subcutaneous QHS   insulin aspart  5 Units Subcutaneous TID WC   insulin glargine-yfgn  22 Units Subcutaneous QHS   pantoprazole  40 mg Oral Daily   Continuous Infusions:  therapeutic plasma exchange solution     calcium gluconate     calcium gluconate     citrate dextrose        Diet Order             Diet Carb Modified Fluid consistency: Thin; Room service appropriate? Yes  Diet effective now                   Intake/Output Summary (Last 24 hours) at 05/17/2022 1006 Last data filed at 05/17/2022 0800 Gross per 24 hour  Intake 1020 ml  Output 0 ml  Net 1020 ml   Net IO Since Admission: 2,635.33 mL [05/17/22 1006]  Wt Readings from Last 3  Encounters:  05/17/22 74.2 kg  01/11/21 80.2 kg  01/01/21 79.5 kg     Unresulted Labs (From admission, onward)     Start     Ordered   05/16/22 9390  Basic metabolic panel  Daily,   R      05/15/22 0830   05/16/22 0500  CBC  Daily,   R      05/15/22 0830   05/16/22 0500  IgG, IgA, IgM  Daily,   R      05/15/22 1604   05/16/22 0500  Viscosity, serum  Tomorrow morning,   R        05/15/22 1604   05/16/22 0339  Pathologist smear review  Once,   R        05/16/22 0339   05/14/22 1114  Viscosity, serum  Once,   R        05/14/22 1114   05/14/22 0000  Pretreatment RBC phenotype  R       Comments: Obtain prior to daratumumab treatment.   Question:  Medication to be given:  Answer:  Daratumumab   05/14/22 1127   05/13/22 1556  Multiple Myeloma Panel (SPEP&IFE w/QIG)  Once,   R        05/13/22 1555   05/13/22 1556  UPEP/UIFE/Light Chains/TP, 24-Hr Ur  Once,   R        05/13/22 1555   05/13/22 1518  Sodium, urine, random  Once,   R        05/13/22 1518   05/13/22 1518  Osmolality, urine  Once,   R         05/13/22 1518          Data Reviewed: I have personally reviewed following labs and imaging studies CBC: Recent Labs  Lab 05/13/22 1130 05/13/22 1639 05/14/22 0459 05/14/22 1315 05/16/22 0339 05/17/22 0425  WBC 10.3  --  8.9  --  12.4* 10.1  NEUTROABS 4.1  --   --   --  7.0  --   HGB 6.7* 6.0* 6.7* 7.1* 9.0* 9.6*  HCT 19.9* 19.4* 21.4* 21.9* 27.8* 28.9*  MCV 93.0  --  91.8  --  90.8 89.2  PLT 93*  --  78*  --  52* 53*   Basic Metabolic Panel: Recent Labs  Lab 05/13/22 1836 05/14/22 0459 05/15/22 1507 05/16/22 0339 05/16/22 1926 05/16/22 2223 05/17/22 0425  NA 129* 133* 126* 129* 124* 124* 129*  K 3.8 4.4 4.3 3.9 4.1 4.3 3.8  CL 99 103 97* 100 95* 96* 99  CO2 _0 21* 21* 22 22  GLUCOSE 183* 160* 409* 400* 481* 356* 286*  BUN 22* 22* 27* 29* 27* 26* 27*  CREATININE 1.02 0.90 1.00 1.00 0.86 0.78 0.75  CALCIUM 10.2 9.6 8.7* 9.0 8.6* 8.6* 8.7*  PHOS 5.5* 3.7  --   --   --   --   --    GFR: Estimated Creatinine Clearance: 94.1 mL/min (by C-G formula based on SCr of 0.75 mg/dL). Liver Function Tests: Recent Labs  Lab 05/13/22 1130 05/13/22 1836 05/14/22 0459 05/16/22 0339  AST 29  --  30 110*  ALT 42  --  41 35  ALKPHOS 43  --  43 37*  BILITOT 0.4  --  0.5 0.2*  PROT >12.0*  --  >12.0* RESULTS UNAVAILABLE DUE TO INTERFERING SUBSTANCE  ALBUMIN 1.6* 1.6* 1.5* 3.0*   No results for input(s): LIPASE, AMYLASE in the last 168 hours. No  results for input(s): AMMONIA in the last 168 hours. Coagulation Profile: No results for input(s): INR, PROTIME in the last 168 hours. BNP (last 3 results) No results for input(s): PROBNP in the last 8760 hours. HbA1C: No results for input(s): HGBA1C in the last 72 hours.  CBG: Recent Labs  Lab 05/16/22 1646 05/16/22 1733 05/16/22 2042  GLUCAP >600* >600* 423*   Lipid Profile: No results for input(s): CHOL, HDL, LDLCALC, TRIG, CHOLHDL, LDLDIRECT in the last 72 hours. Thyroid Function Tests: No results for  input(s): TSH, T4TOTAL, FREET4, T3FREE, THYROIDAB in the last 72 hours. Sepsis Labs: No results for input(s): PROCALCITON, LATICACIDVEN in the last 168 hours.  No results found for this or any previous visit (from the past 240 hour(s)).  Antimicrobials: Anti-infectives (From admission, onward)    None      Culture/Microbiology No results found for: SDES, SPECREQUEST, CULT, REPTSTATUS  Other culture-see note  Radiology Studies: No results found.   LOS: 3 days   Antonieta Pert, MD [facility]  05/17/2022, 10:06 AM

## 2022-05-17 NOTE — Procedures (Signed)
Arrived to hemodialysis ready for his PLEX treatment.  Vital signs stable with BP140/80.  Pt was re-weighed for accuracy. Wt of 74kg will be the new wt for PLEX set up.  As such our new goal for exchange will be a removal of 3348ms and replace of 321103m. Follow treatment Pt BP was 114/75 Pt plasma removed was 3341 and replacement volume was 3169 with the total volume to pt of 3347.  Pt had no complaints and was stable upon return to room.

## 2022-05-17 NOTE — Progress Notes (Signed)
[name]  HEMATOLOGY/ONCOLOGY INPATIENT PROGRESS NOTE  Date of Service: 05/17/2022  Inpatient Attending: .Antonieta Pert, MD   SUBJECTIVE  Patient was seen in hematology follow-up today for his multiple myeloma with hyperviscosity syndrome.  He has had some issues with hyperglycemia and it is unclear if he has baseline diabetes or this is purely from his steroids.  He should be getting his last dose of steroids today. Awaiting plasmapheresis today with a plan to again plasmapheresis him on Monday prior to discharge. Patient notes he has been feeling well with resolved headaches and no dizziness.  He notes his visual blurring has improved significantly but is still notable in his left eye. He does not have a primary care physician and we discussed that he will certainly need one. No bleeding issues. Labs discussed with the patient in detail.    OBJECTIVE:  NAD  PHYSICAL EXAMINATION: . Vitals:   05/17/22 1115 05/17/22 1130 05/17/22 1135 05/17/22 1145  BP: 134/82 122/74 124/79 114/75  Pulse: 79 80 81 78  Resp: (!) 27 (!) 29 (!) 31 (!) 30  Temp:   98.7 F (37.1 C) 98.7 F (37.1 C)  TempSrc:      SpO2:    100%  Weight:      Height:       Filed Weights   05/15/22 2111 05/17/22 0500 05/17/22 1017  Weight: 179 lb 14.3 oz (81.6 kg) 163 lb 8 oz (74.2 kg) 163 lb (73.9 kg)   .Body mass index is 26.31 kg/m.  GENERAL:alert, in no acute distress and comfortable OROPHARYNX:no exudate, no erythema and lips, buccal mucosa, and tongue normal  NECK: supple, no JVD,  right neck hemodialysis catheter  LYMPH:  no palpable lymphadenopathy in the cervical, axillary or inguinal LUNGS: clear to auscultation with normal respiratory effort HEART: regular rate & rhythm,  no murmurs and no lower extremity edema ABDOMEN: abdomen soft, non-tender, normoactive bowel sounds  Musculoskeletal: no cyanosis of digits and no clubbing  PSYCH: alert & oriented x 3 with fluent speech NEURO: no focal motor/sensory  deficits  MEDICAL HISTORY:  Past Medical History:  Diagnosis Date   Anemia     SURGICAL HISTORY: Past Surgical History:  Procedure Laterality Date   APPENDECTOMY     Christifer FLUORO GUIDE CV LINE RIGHT  05/15/2022   Ghassan US GUIDE VASC ACCESS RIGHT  05/15/2022    SOCIAL HISTORY: Social History   Socioeconomic History   Marital status: Married    Spouse name: Not on file   Number of children: Not on file   Years of education: Not on file   Highest education level: Not on file  Occupational History   Not on file  Tobacco Use   Smoking status: Never   Smokeless tobacco: Never  Vaping Use   Vaping Use: Never used  Substance and Sexual Activity   Alcohol use: Never   Drug use: Never   Sexual activity: Not on file  Other Topics Concern   Not on file  Social History Narrative   Not on file   Social Determinants of Health   Financial Resource Strain: Not on file  Food Insecurity: Not on file  Transportation Needs: Not on file  Physical Activity: Not on file  Stress: Not on file  Social Connections: Not on file  Intimate Partner Violence: Not on file    FAMILY HISTORY: History reviewed. No pertinent family history.  ALLERGIES:  has No Known Allergies.  MEDICATIONS:  Scheduled Meds:  calcium carbonate  2  tablet Oral Q3H   Chlorhexidine Gluconate Cloth  6 each Topical Daily   feeding supplement (GLUCERNA SHAKE)  237 mL Oral BID BM   heparin sodium (porcine)       heparin sodium (porcine)  1,000 Units Intracatheter Once   insulin aspart  0-15 Units Subcutaneous TID WC   insulin aspart  0-5 Units Subcutaneous QHS   insulin aspart  5 Units Subcutaneous TID WC   insulin glargine-yfgn  22 Units Subcutaneous QHS   pantoprazole  40 mg Oral Daily   Continuous Infusions:  calcium gluconate 2,000 mg (05/17/22 1042)   citrate dextrose     PRN Meds:.acetaminophen **OR** acetaminophen, diphenhydrAMINE, ondansetron **OR** ondansetron (ZOFRAN) IV  REVIEW OF SYSTEMS:    10  Point review of Systems was done is negative except as noted above.   LABORATORY DATA:  I have reviewed the data as listed  .    Latest Ref Rng & Units 05/17/2022    4:25 AM 05/16/2022    3:39 AM 05/14/2022    1:15 PM  CBC  WBC 4.0 - 10.5 K/uL 10.1   12.4     Hemoglobin 13.0 - 17.0 g/dL 9.6   9.0   7.1    Hematocrit 39.0 - 52.0 % 28.9   27.8   21.9    Platelets 150 - 400 K/uL 53   52       .    Latest Ref Rng & Units 05/17/2022    4:25 AM 05/16/2022   10:23 PM 05/16/2022    7:26 PM  CMP  Glucose 70 - 99 mg/dL 286   356   481    BUN 6 - 20 mg/dL _0 Creatinine 0.61 - 1.24 mg/dL 0.75   0.78   0.86    Sodium 135 - 145 mmol/L 129   124   124    Potassium 3.5 - 5.1 mmol/L 3.8   4.3   4.1    Chloride 98 - 111 mmol/L 99   96   95    CO2 22 - 32 mmol/L _1 Calcium 8.9 - 10.3 mg/dL 8.7   8.6   8.6       RADIOGRAPHIC STUDIES: I have personally reviewed the radiological images as listed and agreed with the findings in the report. Deveion Fluoro Guide CV Line Right  Result Date: 05/15/2022 INDICATION: 56 year old male referred for temporary hemodialysis catheter for plasmapheresis purpose EXAM: IMAGE GUIDED TEMPORARY HEMODIALYSIS CATHETER MEDICATIONS: None ANESTHESIA/SEDATION: None FLUOROSCOPY: Radiation Exposure Index (as provided by the fluoroscopic device): 1 mGy Kerma COMPLICATIONS: None PROCEDURE: Informed written consent was obtained from the patient's family after a discussion of the risks, benefits, and alternatives to treatment. Questions regarding the procedure were encouraged and answered. The right neck was prepped with chlorhexidine in a sterile fashion, and a sterile drape was applied covering the operative field. Maximum barrier sterile technique with sterile gowns and gloves were used for the procedure. A timeout was performed prior to the initiation of the procedure. A micropuncture kit was utilized to access the right internal jugular vein under direct,  real-time ultrasound guidance after the overlying soft tissues were anesthetized with 1% lidocaine with epinephrine. Ultrasound image documentation was performed. The microwire was kinked to measure appropriate catheter length. A stiff glidewire was advanced to the level of the IVC. A 16 cm hemodialysis catheter was then placed over the wire. Final  catheter positioning was confirmed and documented with a spot radiographic image. The catheter aspirates and flushes normally. The catheter was flushed with appropriate volume heparin dwells. Dressings were applied. The patient tolerated the procedure well without immediate post procedural complication. IMPRESSION: Status post right IJ temporary hemodialysis catheter. Signed, Dulcy Fanny. Dellia Nims, RPVI [facility] Electronically Signed   By: Corrie Mckusick D.O.   On: 05/15/2022 09:57   Becket US Guide Vasc Access Right  Result Date: 05/15/2022 INDICATION: 56 year old male referred for temporary hemodialysis catheter for plasmapheresis purpose EXAM: IMAGE GUIDED TEMPORARY HEMODIALYSIS CATHETER MEDICATIONS: None ANESTHESIA/SEDATION: None FLUOROSCOPY: Radiation Exposure Index (as provided by the fluoroscopic device): 1 mGy Kerma COMPLICATIONS: None PROCEDURE: Informed written consent was obtained from the patient's family after a discussion of the risks, benefits, and alternatives to treatment. Questions regarding the procedure were encouraged and answered. The right neck was prepped with chlorhexidine in a sterile fashion, and a sterile drape was applied covering the operative field. Maximum barrier sterile technique with sterile gowns and gloves were used for the procedure. A timeout was performed prior to the initiation of the procedure. A micropuncture kit was utilized to access the right internal jugular vein under direct, real-time ultrasound guidance after the overlying soft tissues were anesthetized with 1%  lidocaine with epinephrine. Ultrasound image documentation was performed. The microwire was kinked to measure appropriate catheter length. A stiff glidewire was advanced to the level of the IVC. A 16 cm hemodialysis catheter was then placed over the wire. Final catheter positioning was confirmed and documented with a spot radiographic image. The catheter aspirates and flushes normally. The catheter was flushed with appropriate volume heparin dwells. Dressings were applied. The patient tolerated the procedure well without immediate post procedural complication. IMPRESSION: Status post right IJ temporary hemodialysis catheter. Signed, Dulcy Fanny. Dellia Nims, RPVI [facility] Electronically Signed   By: Corrie Mckusick D.O.   On: 05/15/2022 09:57   DG Chest Port 1 View  Result Date: 05/13/2022 CLINICAL DATA:  Weakness EXAM: PORTABLE CHEST 1 VIEW COMPARISON:  None Available. FINDINGS: The heart size and mediastinal contours are within normal limits. Slightly low lung volumes. No focal airspace consolidation, pleural effusion, or pneumothorax. The visualized skeletal structures are unremarkable. IMPRESSION: No active disease. Electronically Signed   By: Davina Poke D.O.   On: 05/13/2022 13:03   DG Bone Survey Met  Result Date: 05/14/2022 CLINICAL DATA:  Multiple myeloma. EXAM: METASTATIC BONE SURVEY COMPARISON:  None Available. FINDINGS: Multiple small rounded lucencies are noted in the skull, the largest noted in the frontal region. Small rounded lucency is noted in proximal right humeral shaft. Small rounded lucency is noted in the proximal right femoral shaft. No other abnormality is noted in the skeleton. IMPRESSION: Small rounded lucencies are noted in the skull, proximal right humerus and proximal right femur concerning for multiple myeloma. Electronically Signed   By: Marijo Conception M.D.   On: 05/14/2022 15:34    ASSESSMENT & PLAN:   1)  Progressive IgG Lambda Multiple myeloma not on treatment for more than 1 year due to patient's lack of follow-up. Patient diagnosed December 2021 and received 1 cycle of CyBorD. Had previously presented with anemia renal insufficiency and hyperviscosity symptoms. Bx- 90% plasma cells Initial M spike 8.03g/dl Del(1p): Not Detected  Dup(1q): Not Detected  Gains(15): DETECTED  Gains(5 and 9): Not Detected  Del(13q)/-13: DETECTED  Del(17p)(TP53): Not Detected  IGH(Rearrangement): SEE BELOW  IgH complex: t(4;14): DETECTED  t(11;14): Not Detected  t(14;16): Not Detected  t(14;20): Not Detected   Cytogenetics: Normal male karyotype.   -Labs from 05/13/2022-beta-2 microglobulin 4.4, LDH 197, lambda free light chain 77.1, kappa, lambda light chain ratio 0.08, multiple myeloma panel pending.  UPEP ordered but not yet collected. 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  Due to multiple myeloma  PLAN: -Completed first session of plasmapheresis 5/18 and plan for repeat plasmapheresis on Saturday 5/20 and again on Monday 5/22.  Thereafter, plasmapheresis can be discontinued.   -Please have temporary dialysis catheter removed following plasmapheresis on 5/22.  If [facility] can accommodate, would recommend Port-A-Cath placement on Monday prior to discharge.  If their schedule does not allow for this, we can arrange to have a Port-A-Cath placed as an outpatient. -I have requested an early appointment on Monday 5/22 with the hopes that he can be discharged after that so that he can  attend his chemotherapy education appointment which is scheduled later that day.  If timing does not allow, then we will reschedule his chemotherapy education appointment. -Continue dexamethasone 40 mg p.o. daily for a total of 4 doses.  Due to complete on Saturday.  Continue PPI for GI prophylaxis. -Will need PET scan as outpatient. -We will follow-up on outstanding labs when they become available.  24-hour urine for UPEP currently being collected. -Recommend PRBC transfusion for hemoglobin less than 7 or if symptomatic. -The patient has been set up for systemic chemotherapy in our office with daratumumab, Velcade, Revlimid, dexamethasone on 5/23.  He has a chemotherapy education appointment scheduled for 5/22. -we will also set him up for outpatient Zometa.  6) Hyperglycemia -Management per hospital medicine. -Last dose of dexamethasone will be today hopefully his blood sugars will improve significantly after that. -He will need referral to endocrinology on discharge if his hyperglycemia does not completely resolve after being off steroids. -He will need outpatient follow-up with ophthalmology to evaluate for his visual blurring especially in the left eye.  Hematology will continue to follow. Appreciate management help from nephrology and hospital medicine.   Sullivan Lone MD MS AAHIVMS [facility] Hematology/Oncology Physician [place]  05/17/2022 1:24 PM   Future Appointments  Date Time Provider [place]  05/19/2022  4:00 PM [facility] CHEMO EDU [facility] None  05/20/2022 12:30 PM [facility] LAB [facility] None  05/20/2022  1:00 PM Brunetta Genera, MD [facility] None  05/21/2022  7:30 AM [facility] INFUSION [facility] None  06/16/2022  2:30 PM Gildardo Pounds, NP [facility] None   Mikey Bussing, DNP, AGPCNP-BC, AOCNP

## 2022-05-17 NOTE — Progress Notes (Signed)
[facility] Progress Note  Subjective: seen in room, no new c/o  Vitals:   05/16/22 1649 05/16/22 2044 05/17/22 0500 05/17/22 0548  BP: (!) 146/92 139/88  126/81  Pulse: 90 83  79  Resp: _0 Temp: 98.4 F (36.9 C) 98 F (36.7 C)  98.2 F (36.8 C)  TempSrc:    Oral  SpO2: 98% 100%  94%  Weight:   74.2 kg   Height:        Exam: Gen alert, no distress No jvd or bruits Chest clear bilat to bases RRR no RG Abd soft ntnd no mass or ascites Ext no edema Neuro is alert, Ox 3 , nf         Home meds include - advil, ambien prn   UA negative except for ++ glucose     Assessment/ Plan: Suspected hyperviscosity syndrome - related to untreated multiple myeloma. Pt had not been following up w/ his doctors. Oncology suspects hyperviscosity syndrome (pt had plasmapheresis at [facility] in the past for hyperviscosity syndrome). We were asked to see for plasmapheresis. Access placed by Joao 5/18. Started plasmapheresis qod got 1st session on 5/18. Per ONC notes the plan is to do 2nd pheresis today and then do 3rd and final session on Monday, then can dc the temp catheter and pt will be dc'd.  Anemia -sp prbcs per pmd Multiple myeloma - w/ low albumin, normal renal function. UA is negative, and no proteinuria.  Hyponatremia - mild, urine studies sent  New onset DM2 - uncont ^BS, started on insulin here long and short acting, per pmd.     Rob Alayne Estrella 05/17/2022, 9:36 AM   Recent Labs  Lab 05/13/22 1836 05/14/22 0459 05/14/22 1315 05/16/22 0339 05/16/22 1926 05/16/22 2223 05/17/22 0425  HGB  --  6.7*   < > 9.0*  --   --  9.6*  ALBUMIN 1.6* 1.5*  --  3.0*  --   --   --   CALCIUM 10.2 9.6   < > 9.0   < > 8.6* 8.7*  PHOS 5.5* 3.7  --   --   --   --   --   CREATININE 1.02 0.90   < > 1.00   < > 0.78 0.75  K 3.8 4.4   < > 3.9   < > 4.3 3.8   < > = values in this interval not displayed.    Inpatient medications:  calcium carbonate       calcium carbonate  2 tablet  Oral Q3H   Chlorhexidine Gluconate Cloth  6 each Topical Daily   feeding supplement (GLUCERNA SHAKE)  237 mL Oral BID BM   heparin sodium (porcine)  1,000 Units Intracatheter Once   insulin aspart  0-15 Units Subcutaneous TID WC   insulin aspart  0-5 Units Subcutaneous QHS   insulin aspart  5 Units Subcutaneous TID WC   insulin glargine-yfgn  22 Units Subcutaneous QHS   pantoprazole  40 mg Oral Daily    calcium gluconate     therapeutic plasma exchange solution     calcium gluconate     citrate dextrose     acetaminophen **OR** acetaminophen, diphenhydrAMINE, ondansetron **OR** ondansetron (ZOFRAN) IV

## 2022-05-18 DIAGNOSIS — D649 Anemia, unspecified: Secondary | ICD-10-CM | POA: Diagnosis not present

## 2022-05-18 LAB — CBC
HCT: 32.6 % — ABNORMAL LOW (ref 39.0–52.0)
Hemoglobin: 10.6 g/dL — ABNORMAL LOW (ref 13.0–17.0)
MCH: 29.2 pg (ref 26.0–34.0)
MCHC: 32.5 g/dL (ref 30.0–36.0)
MCV: 89.8 fL (ref 80.0–100.0)
Platelets: 44 10*3/uL — ABNORMAL LOW (ref 150–400)
RBC: 3.63 MIL/uL — ABNORMAL LOW (ref 4.22–5.81)
RDW: 18.5 % — ABNORMAL HIGH (ref 11.5–15.5)
WBC: 9.6 10*3/uL (ref 4.0–10.5)
nRBC: 54.9 % — ABNORMAL HIGH (ref 0.0–0.2)

## 2022-05-18 LAB — BASIC METABOLIC PANEL
Anion gap: 5 (ref 5–15)
Anion gap: 4 — ABNORMAL LOW (ref 5–15)
BUN: 20 mg/dL (ref 6–20)
BUN: 27 mg/dL — ABNORMAL HIGH (ref 6–20)
CO2: 23 mmol/L (ref 22–32)
CO2: 23 mmol/L (ref 22–32)
Calcium: 8.6 mg/dL — ABNORMAL LOW (ref 8.9–10.3)
Calcium: 7.9 mg/dL — ABNORMAL LOW (ref 8.9–10.3)
Chloride: 103 mmol/L (ref 98–111)
Chloride: 102 mmol/L (ref 98–111)
Creatinine, Ser: 0.72 mg/dL (ref 0.61–1.24)
Creatinine, Ser: 0.88 mg/dL (ref 0.61–1.24)
GFR, Estimated: >60 mL/min (ref >60)
GFR, Estimated: >60 mL/min (ref >60)
Glucose, Bld: 192 mg/dL — ABNORMAL HIGH (ref 70–99)
Glucose, Bld: 149 mg/dL — ABNORMAL HIGH (ref 70–99)
Potassium: 3.8 mmol/L (ref 3.5–5.1)
Potassium: 3.8 mmol/L (ref 3.5–5.1)
Sodium: 131 mmol/L — ABNORMAL LOW (ref 135–145)
Sodium: 129 mmol/L — ABNORMAL LOW (ref 135–145)

## 2022-05-18 LAB — BPAM RBC
Blood Product Expiration Date: 202306082359
Blood Product Expiration Date: 202306082359
Unit Type and Rh: 6200
Unit Type and Rh: 6200

## 2022-05-18 LAB — IGG, IGA, IGM
IgA: 9 mg/dL — ABNORMAL LOW (ref 90–386)
IgG (Immunoglobin G), Serum: 7245 mg/dL — ABNORMAL HIGH (ref 603–1613)
IgM (Immunoglobulin M), Srm: <5 mg/dL — ABNORMAL LOW (ref 20–172)

## 2022-05-18 LAB — GLUCOSE, CAPILLARY
Glucose-Capillary: 160 mg/dL — ABNORMAL HIGH (ref 70–99)
Glucose-Capillary: 198 mg/dL — ABNORMAL HIGH (ref 70–99)
Glucose-Capillary: 222 mg/dL — ABNORMAL HIGH (ref 70–99)
Glucose-Capillary: 159 mg/dL — ABNORMAL HIGH (ref 70–99)

## 2022-05-18 MED ORDER — SODIUM CHLORIDE 0.9% FLUSH: 10.0000 mL | INTRAVENOUS | Status: DC | PRN

## 2022-05-18 MED ORDER — CALCIUM CARBONATE ANTACID 500 MG PO CHEW
2.0000 | CHEWABLE_TABLET | ORAL | Status: AC
  Filled 2022-05-18: qty 2

## 2022-05-18 MED ORDER — CALCIUM GLUCONATE-NACL 2-0.675 GM/100ML-% IV SOLN
2.0000 g | Freq: Once | INTRAVENOUS | Status: AC
  Administered 2022-05-19: 2000 mg via INTRAVENOUS
  Filled 2022-05-18: qty 100

## 2022-05-18 MED ORDER — SODIUM CHLORIDE 0.9 % IV SOLN
INTRAVENOUS | Status: AC
  Filled 2022-05-18 (×3): qty 200

## 2022-05-18 MED ORDER — HEPARIN SODIUM (PORCINE) 1000 UNIT/ML IJ SOLN: 1000.0000 [IU] | Freq: Once | INTRAMUSCULAR | Status: AC

## 2022-05-18 MED ORDER — ACD FORMULA A 0.73-2.45-2.2 GM/100ML VI SOLN
1000.0000 mL | Status: DC
  Administered 2022-05-19: 1000 mL
  Filled 2022-05-18: qty 1000

## 2022-05-18 NOTE — Procedures (Addendum)
   I was present at this plasmapheresis session, have reviewed the session itself and made  appropriate changes Kelly Splinter MD [place] pager [phone]   05/18/2022, 1:43 PM

## 2022-05-18 NOTE — Progress Notes (Signed)
PROGRESS NOTE Donald Suarez  MRN:[id number] DOB: 03/13/1966 DOA: 05/13/2022 PCP: Pcp, No   Brief Narrative/Hospital Course: 56-year-old male with history of multiple myeloma who was lost to follow-up presented to the hospital with fatigue and dizziness he also complained of headache and some blurring of vision.  He was noted to have hemoglobin of 6 on admission with a platelets of 78,000.  Patient was admitted, transfused 2 units.  Patient seen by oncology Dr Kale concerned for hyperviscosity and needing plasmapheresis.  Patient was  transferred to [facility] for the same. Nephrology and interventional radiology were consulted for the same  for porta a cath-completed first session of plasmapheresis 5/18 and repeating plasmapheresis 5/20 and 5/22.  Patient was found to have uncontrolled hyperglycemia A1c 8s, seen by diabetic coordinator placed on insulin.      Subjective: Seen and examined this morning. Overnight afebrile, blood sugar improving 160 this morning. 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 Seen by oncology recommending plasmapheresis given concern for hyperviscosity especially after receiving blood transfusion, transferred to [place], [place]-for Port-A-Cath and nephrology on board.  S/p plasmapheresis 5/18,5/20 and again for 5/22.Will need PET as outpatient, on dexamethasone 40 mg daily for 4 doses total to complete Saturday.  Anticipate discharge on 5/22 Monday-and already has appointment in the hem-onc office 5/22  Symptomatic anemia:s/p 2 units PRBC,  improved and stable.  Monitor  Recent Labs  Lab 05/14/22 0459 05/14/22 1315 05/16/22 0339 05/17/22 0425 05/18/22 0137  HGB 6.7* 7.1* 9.0* 9.6* 10.6*  HCT 21.4* 21.9* 27.8* 28.9* 32.6*    Hypercalcemia due to multiple myeloma oncology recommends bisphosphonates as outpatient.  Monitor calcium. Recent Labs  Lab 05/13/22 1836 05/14/22 0459 05/15/22 1507 05/16/22 0339 05/16/22 1926 05/16/22 2223 05/17/22 0425 05/18/22 0137  K 3.8 4.4   < > 3.9 4.1 4.3 3.8 3.8  CALCIUM 10.2 9.6   < > 9.0 8.6* 8.6* 8.7* 8.6*  PHOS 5.5* 3.7  --   --   --   --   --   --    < > = values in this interval not displayed.    Mild hyponatremia -also component of pseudohyponatremia in the setting of hyperglycemia.  Monitor  Recent Labs  Lab 05/16/22 0339 05/16/22 1926 05/16/22 2223 05/17/22 0425 05/18/22 0137  NA 129* 124* 124* 129* 131*    Severe hypoalbuminemia in the setting of multiple myeloma.  Augment diet Thrombocytopenia due to multiple myeloma.  Further downtrending.  Oncology following plan as per oncology.  Monitor closely Recent Labs  Lab 05/13/22 1130 05/14/22 0459 05/16/22 0339 05/17/22 0425 05/18/22 0137  PLT 93* 78* 52* 53* 44*   New onset type 2 diabetes with uncontrolled hyperglycemia:A1c 8.2 blood sugar poorly controlled in 600s-also due to patient seen getting dexamethasone high-dose-last dose 5/20.  Cont on long-acting insulin and Premeal insulin along with SSI.Blood sugar improving , hopefully can wean down patient's insulin regimen as dexamethasone wears off-diabetes coordinator following will need insulin teaching as I anticipate he will need to go home on insulin. Recent Labs  Lab 05/13/22 1639 05/16/22 1646 05/17/22   0734 05/17/22 1324 05/17/22 1639 05/17/22 2017 05/18/22 0740  GLUCAP  --    < > 277* 269* 493* 327* 160*  HGBA1C 8.2*  --   --   --   --   --   --    < > = values in this interval not displayed.    Body mass index is 26.31 kg/m.  Overweight. DVT  prophylaxis: SCDs Start: 05/13/22 1609 Code Status:   Code Status: Full Code Family Communication: plan of care discussed with patient/daughter at bedside. Patient status is: Inpatient because of ongoing plasmapheresis Level of care: Med-Surg   Dispo: The patient is from: Home            Anticipated disposition: home  Mobility Assessment (last 72 hours)     Mobility Assessment     Row Name 05/17/22 0900 05/16/22 1926 05/15/22 2300       Does patient have an order for bedrest or is patient medically unstable No - Continue assessment No - Continue assessment No - Continue assessment     What is the highest level of mobility based on the progressive mobility assessment? Level 6 (Walks independently in room and hall) - Balance while walking in room without assist - Complete Level 6 (Walks independently in room and hall) - Balance while walking in room without assist - Complete Level 6 (Walks independently in room and hall) - Balance while walking in room without assist - Complete               Objective: Vitals last 24 hrs: Vitals:   05/17/22 1300 05/17/22 1726 05/17/22 2016 05/18/22 0915  BP:  121/76 133/87 (!) 142/92  Pulse:  77 77 75  Resp: 20 19 18 17  Temp:  98.5 F (36.9 C) 97.9 F (36.6 C) 98 F (36.7 C)  TempSrc:   Oral   SpO2:  98% 96% 98%  Weight:      Height:       Weight change: -0.227 kg  Physical Examination: General exam: AA, older than stated age, weak appearing. HEENT:Oral mucosa moist, Ear/Nose WNL grossly, dentition normal. Respiratory system: bilaterally diminished, no use of accessory muscle Cardiovascular system: S1 & S2 +, No JVD,. Gastrointestinal system: Abdomen soft,NT,ND,BS+ Nervous System:Alert, awake, moving extremities and grossly nonfocal Extremities: LE ankle edema none, distal peripheral pulses palpable.  Skin: No rashes,no icterus. MSK: Normal muscle bulk,tone, power  Right IJ HD catheter present with dressing in place  Medications  reviewed:  Scheduled Meds:  Chlorhexidine Gluconate Cloth  6 each Topical Daily   feeding supplement (GLUCERNA SHAKE)  237 mL Oral BID BM   heparin sodium (porcine)  1,000 Units Intracatheter Once   insulin aspart  0-15 Units Subcutaneous TID WC   insulin aspart  0-5 Units Subcutaneous QHS   insulin aspart  5 Units Subcutaneous TID WC   insulin glargine-yfgn  25 Units Subcutaneous QHS   pantoprazole  40 mg Oral Daily   Continuous Infusions:  citrate dextrose        Diet Order             Diet NPO time specified Except for: Sips with Meds  Diet effective now           Diet Carb Modified Fluid consistency: Thin; Room service appropriate? Yes  Diet effective now                   Intake/Output Summary (Last 24 hours) at 05/18/2022 1109 Last data filed   at 05/18/2022 0800 Gross per 24 hour  Intake 1380 ml  Output 1 ml  Net 1379 ml   Net IO Since Admission: 4,014.33 mL [05/18/22 1109]  Wt Readings from Last 3 Encounters:  05/17/22 73.9 kg  01/11/21 80.2 kg  01/01/21 79.5 kg     Unresulted Labs (From admission, onward)     Start     Ordered   05/16/22 0500  Basic metabolic panel  Daily,   R      05/15/22 0830   05/16/22 0500  CBC  Daily,   R      05/15/22 0830   05/16/22 0500  IgG, IgA, IgM  Daily,   R      05/15/22 1604   05/16/22 0500  Viscosity, serum  Tomorrow morning,   R        05/15/22 1604   05/16/22 0339  Pathologist smear review  Once,   R        05/16/22 0339   05/14/22 1114  Viscosity, serum  Once,   R        05/14/22 1114   05/14/22 0000  Pretreatment RBC phenotype  R       Comments: Obtain prior to daratumumab treatment.   Question:  Medication to be given:  Answer:  Daratumumab   05/14/22 1127   05/13/22 1556  Multiple Myeloma Panel (SPEP&IFE w/QIG)  Once,   R        05/13/22 1555   05/13/22 1556  UPEP/UIFE/Light Chains/TP, 24-Hr Ur  Once,   R        05/13/22 1555   05/13/22 1518  Sodium, urine, random  Once,   R        05/13/22 1518    05/13/22 1518  Osmolality, urine  Once,   R        05/13/22 1518          Data Reviewed: I have personally reviewed following labs and imaging studies CBC: Recent Labs  Lab 05/13/22 1130 05/13/22 1639 05/14/22 0459 05/14/22 1315 05/16/22 0339 05/17/22 0425 05/18/22 0137  WBC 10.3  --  8.9  --  12.4* 10.1 9.6  NEUTROABS 4.1  --   --   --  7.0  --   --   HGB 6.7*   < > 6.7* 7.1* 9.0* 9.6* 10.6*  HCT 19.9*   < > 21.4* 21.9* 27.8* 28.9* 32.6*  MCV 93.0  --  91.8  --  90.8 89.2 89.8  PLT 93*  --  78*  --  52* 53* 44*   < > = values in this interval not displayed.   Basic Metabolic Panel: Recent Labs  Lab 05/13/22 1836 05/14/22 0459 05/15/22 1507 05/16/22 0339 05/16/22 1926 05/16/22 2223 05/17/22 0425 05/18/22 0137  NA 129* 133*   < > 129* 124* 124* 129* 131*  K 3.8 4.4   < > 3.9 4.1 4.3 3.8 3.8  CL 99 103   < > 100 95* 96* 99 103  CO2 23 23   < > 21* 21* 22 22 23  GLUCOSE 183* 160*   < > 400* 481* 356* 286* 192*  BUN 22* 22*   < > 29* 27* 26* 27* 20  CREATININE 1.02 0.90   < > 1.00 0.86 0.78 0.75 0.72  CALCIUM 10.2 9.6   < > 9.0 8.6* 8.6* 8.7* 8.6*  PHOS 5.5* 3.7  --   --   --   --   --   --    < > =   values in this interval not displayed.   GFR: Estimated Creatinine Clearance: 94.1 mL/min (by C-G formula based on SCr of 0.72 mg/dL). Liver Function Tests: Recent Labs  Lab 05/13/22 1130 05/13/22 1836 05/14/22 0459 05/16/22 0339  AST 29  --  30 110*  ALT 42  --  41 35  ALKPHOS 43  --  43 37*  BILITOT 0.4  --  0.5 0.2*  PROT >12.0*  --  >12.0* RESULTS UNAVAILABLE DUE TO INTERFERING SUBSTANCE  ALBUMIN 1.6* 1.6* 1.5* 3.0*   No results for input(s): LIPASE, AMYLASE in the last 168 hours. No results for input(s): AMMONIA in the last 168 hours. Coagulation Profile: No results for input(s): INR, PROTIME in the last 168 hours. BNP (last 3 results) No results for input(s): PROBNP in the last 8760 hours. HbA1C: No results for input(s): HGBA1C in the last 72  hours.  CBG: Recent Labs  Lab 05/17/22 0734 05/17/22 1324 05/17/22 1639 05/17/22 2017 05/18/22 0740  GLUCAP 277* 269* 493* 327* 160*   Lipid Profile: No results for input(s): CHOL, HDL, LDLCALC, TRIG, CHOLHDL, LDLDIRECT in the last 72 hours. Thyroid Function Tests: No results for input(s): TSH, T4TOTAL, FREET4, T3FREE, THYROIDAB in the last 72 hours. Sepsis Labs: No results for input(s): PROCALCITON, LATICACIDVEN in the last 168 hours.  No results found for this or any previous visit (from the past 240 hour(s)).  Antimicrobials: Anti-infectives (From admission, onward)    None      Culture/Microbiology No results found for: SDES, SPECREQUEST, CULT, REPTSTATUS  Other culture-see note  Radiology Studies: No results found.   LOS: 4 days   Antonieta Pert, MD [facility]  05/18/2022, 11:09 AM

## 2022-05-19 ENCOUNTER — Encounter (HOSPITAL_COMMUNITY): Payer: Self-pay

## 2022-05-19 ENCOUNTER — Inpatient Hospital Stay: Payer: BC Managed Care – PPO

## 2022-05-19 ENCOUNTER — Telehealth: Payer: Self-pay

## 2022-05-19 ENCOUNTER — Inpatient Hospital Stay (HOSPITAL_COMMUNITY): Payer: BC Managed Care – PPO

## 2022-05-19 ENCOUNTER — Other Ambulatory Visit: Payer: Self-pay

## 2022-05-19 DIAGNOSIS — C9000 Multiple myeloma not having achieved remission: Secondary | ICD-10-CM

## 2022-05-19 DIAGNOSIS — D649 Anemia, unspecified: Secondary | ICD-10-CM | POA: Diagnosis not present

## 2022-05-19 HISTORY — PX: IR PATIENT EVAL TECH 0-60 MINS: IMG5564

## 2022-05-19 LAB — CBC
HCT: 32.2 % — ABNORMAL LOW (ref 39.0–52.0)
Hemoglobin: 10.7 g/dL — ABNORMAL LOW (ref 13.0–17.0)
MCH: 29.5 pg (ref 26.0–34.0)
MCHC: 33.2 g/dL (ref 30.0–36.0)
MCV: 88.7 fL (ref 80.0–100.0)
Platelets: 61 10*3/uL — ABNORMAL LOW (ref 150–400)
RBC: 3.63 MIL/uL — ABNORMAL LOW (ref 4.22–5.81)
RDW: 19.2 % — ABNORMAL HIGH (ref 11.5–15.5)
WBC: 7.7 10*3/uL (ref 4.0–10.5)
nRBC: 64.8 % — ABNORMAL HIGH (ref 0.0–0.2)

## 2022-05-19 LAB — GLUCOSE, CAPILLARY
Glucose-Capillary: 103 mg/dL — ABNORMAL HIGH (ref 70–99)
Glucose-Capillary: 152 mg/dL — ABNORMAL HIGH (ref 70–99)
Glucose-Capillary: 177 mg/dL — ABNORMAL HIGH (ref 70–99)
Glucose-Capillary: 169 mg/dL — ABNORMAL HIGH (ref 70–99)

## 2022-05-19 LAB — BASIC METABOLIC PANEL
Anion gap: 5 (ref 5–15)
BUN: 23 mg/dL — ABNORMAL HIGH (ref 6–20)
CO2: 24 mmol/L (ref 22–32)
Calcium: 8.1 mg/dL — ABNORMAL LOW (ref 8.9–10.3)
Chloride: 103 mmol/L (ref 98–111)
Creatinine, Ser: 0.68 mg/dL (ref 0.61–1.24)
GFR, Estimated: >60 mL/min (ref >60)
Glucose, Bld: 113 mg/dL — ABNORMAL HIGH (ref 70–99)
Potassium: 3.3 mmol/L — ABNORMAL LOW (ref 3.5–5.1)
Sodium: 132 mmol/L — ABNORMAL LOW (ref 135–145)

## 2022-05-19 LAB — PATHOLOGIST SMEAR REVIEW

## 2022-05-19 MED ORDER — INSULIN GLARGINE-YFGN 100 UNIT/ML ~~LOC~~ SOLN
15.0000 [IU] | Freq: Every day | SUBCUTANEOUS | Status: DC
  Administered 2022-05-19: 15 [IU] via SUBCUTANEOUS
  Filled 2022-05-19 (×2): qty 0.15

## 2022-05-19 MED ORDER — LIVING WELL WITH DIABETES BOOK
Freq: Once | Status: AC
  Filled 2022-05-19: qty 1

## 2022-05-19 MED ORDER — HEPARIN SODIUM (PORCINE) 1000 UNIT/ML IJ SOLN
INTRAMUSCULAR | Status: AC
  Administered 2022-05-19: 1000 [IU]
  Filled 2022-05-19: qty 3

## 2022-05-19 NOTE — Progress Notes (Signed)
PROGRESS NOTE Donald Suarez  JGG:[id number] DOB: [birth year] DOA: 05/13/2022 PCP: Pcp, No   Brief Narrative/Hospital Course: 56 year old male with history of multiple myeloma who was lost to follow-up presented to the hospital with fatigue and dizziness he also complained of headache and some blurring of vision.  He was noted to have hemoglobin of 6 on admission with a platelets of 78,000.  Patient was admitted, transfused 2 units.  Patient seen by oncology Dr Irene Limbo concerned for hyperviscosity and needing plasmapheresis.  Patient was  transferred to [facility] for the same. Nephrology and interventional radiology were consulted for the same  for porta a cath-completed first session of plasmapheresis 5/18 and repeating plasmapheresis 5/20 and 5/22.  Patient was found to have uncontrolled hyperglycemia A1c 8s, seen by diabetic coordinator placed on insulin.     Subjective: Seen this morning in dialysis.  Patient getting plasmapheresis.   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 Seen by oncology recommending plasmapheresis given concern for hyperviscosity especially after receiving blood transfusion, transferred to [facility], [place]-for Port-A-Cath and nephrology on board.  S/p plasmapheresis 5/18,5/20 and one more 5/22.Will need PET as outpatient, on dexamethasone 40 mg daily for 4 doses- last  4/20. He has appointment to discuss chemotherapy tomorrow at oncology office.Overnight no acute events.   Symptomatic anemia:s/p 2 units PRBC, improved and stable.  Monitor  Recent Labs  Lab 05/14/22 1315 05/16/22 0339  05/17/22 0425 05/18/22 0137 05/19/22 0309  HGB 7.1* 9.0* 9.6* 10.6* 10.7*  HCT 21.9* 27.8* 28.9* 32.6* 32.2*     Hypercalcemia due to multiple myeloma oncology recommends bisphosphonates as outpatient.  Monitor calcium. Recent Labs  Lab 05/13/22 1836 05/14/22 0459 05/15/22 1507 05/16/22 2223 05/17/22 0425 05/18/22 0137 05/18/22 2050 05/19/22 0309  K 3.8 4.4   < > 4.3 3.8 3.8 3.8 3.3*  CALCIUM 10.2 9.6   < > 8.6* 8.7* 8.6* 7.9* 8.1*  PHOS 5.5* 3.7  --   --   --   --   --   --    < > = values in this interval not displayed.     Mild hyponatremia -also component of pseudohyponatremia in the setting of hyperglycemia.  Monitor  Recent Labs  Lab 05/16/22 2223 05/17/22 0425 05/18/22 0137 05/18/22 2050 05/19/22 0309  NA 124* 129* 131* 129* 132*     Severe hypoalbuminemia in the setting of multiple myeloma.  Augment diet Thrombocytopenia due to multiple myeloma.  Platelet improving, follow-up outpatient. Recent Labs  Lab 05/14/22 0459 05/16/22 0339 05/17/22 0425 05/18/22 0137 05/19/22 0309  PLT 78* 52* 53* 44* 61*    New onset type 2 diabetes with uncontrolled hyperglycemia:A1c 8.2 blood sugar poorly controlled in 600s-also due to patient seen getting dexamethasone high-dose-last dose 5/20.  Blood sugar now downtrending ongoing to decrease long-acting insulin further to 15 units, keep on Premeal NovoLog 5 units and SSI.  Will needadditional 24 hours, will also need diabetes and insulin teaching he is not comfortable with insulin yet.  Educated on risk of hypoglycemia while on insulin if he does not eat.  Recent Labs  Lab 05/13/22 1639 05/16/22 1646 05/18/22 0740 05/18/22 1129  05/18/22 1627 05/18/22 2119 05/19/22 0807  GLUCAP  --    < > 160* 198* 222* 159* 103*  HGBA1C 8.2*  --   --   --   --   --   --    < > = values in this interval not displayed.    Hypokalemia monitor  Body mass index is 26.31 kg/m.  Overweight. DVT prophylaxis: SCDs Start: 05/13/22  1609 Code Status:   Code Status: Full Code Family Communication: plan of care discussed with patient/daughter at bedside. Patient status is: Inpatient because of ongoing plasmapheresis Level of care: Med-Surg   Dispo: The patient is from: Home            Anticipated disposition: home home tomorrow if blood sugar remains stable and once completed with insulin and diabetes teaching  Mobility Assessment (last 72 hours)     Mobility Assessment     Row Name 05/17/22 0900 05/16/22 1926         Does patient have an order for bedrest or is patient medically unstable No - Continue assessment No - Continue assessment      What is the highest level of mobility based on the progressive mobility assessment? Level 6 (Walks independently in room and hall) - Balance while walking in room without assist - Complete Level 6 (Walks independently in room and hall) - Balance while walking in room without assist - Complete                Objective: Vitals last 24 hrs: Vitals:   05/19/22 0955 05/19/22 1000 05/19/22 1015 05/19/22 1030  BP: 122/73 120/74 117/74 119/72  Pulse:      Resp: (!) 26 (!) 22 (!) 26 (!) 26  Temp: 98.7 F (37.1 C) 98.4 F (36.9 C) 98.4 F (36.9 C) 98.4 F (36.9 C)  TempSrc:      SpO2:      Weight:      Height:       Weight change: -0.736 kg  Physical Examination: General exam: AAOX3, pleasant,older than stated age, weak appearing. HEENT:Oral mucosa moist, Ear/Nose WNL grossly, dentition normal. Respiratory system: bilaterally diminished,no use of accessory muscle Cardiovascular system: S1 & S2 +, No JVD,. Gastrointestinal system: Abdomen soft,NT,ND, BS+ Nervous System:Alert, awake, moving extremities and grossly nonfocal Extremities: edema neg,distal peripheral pulses palpable.  Skin: No rashes,no icterus. MSK: Normal muscle bulk,tone, power  Right IJ HD catheter in place for plasmapheresis  Medications reviewed:  Scheduled Meds:  calcium carbonate  2 tablet  Oral Q3H   Chlorhexidine Gluconate Cloth  6 each Topical Daily   feeding supplement (GLUCERNA SHAKE)  237 mL Oral BID BM   heparin sodium (porcine)       heparin sodium (porcine)  1,000 Units Intracatheter Once   insulin aspart  0-15 Units Subcutaneous TID WC   insulin aspart  0-5 Units Subcutaneous QHS   insulin aspart  5 Units Subcutaneous TID WC   insulin glargine-yfgn  15 Units Subcutaneous QHS   pantoprazole  40 mg Oral Daily   Continuous Infusions:  therapeutic plasma exchange solution     calcium gluconate 2,000 mg (05/19/22 0959)   citrate dextrose        Diet Order             Diet NPO time specified Except for: Sips with Meds  Diet effective now                   Intake/Output Summary (Last 24  hours) at 05/19/2022 1039 Last data filed at 05/18/2022 1700 Gross per 24 hour  Intake 600 ml  Output --  Net 600 ml    Net IO Since Admission: 4,614.33 mL [05/19/22 1039]  Wt Readings from Last 3 Encounters:  05/19/22 73.9 kg  01/11/21 80.2 kg  01/01/21 79.5 kg     Unresulted Labs (From admission, onward)     Start     Ordered   05/16/22 0998  Basic metabolic panel  Daily,   R      05/15/22 0830   05/16/22 0500  CBC  Daily,   R      05/15/22 0830   05/16/22 0500  IgG, IgA, IgM  Daily,   R      05/15/22 1604   05/16/22 0500  Viscosity, serum  Tomorrow morning,   R        05/15/22 1604   05/14/22 1114  Viscosity, serum  Once,   R        05/14/22 1114   05/14/22 0000  Pretreatment RBC phenotype  R       Comments: Obtain prior to daratumumab treatment.   Question:  Medication to be given:  Answer:  Daratumumab   05/14/22 1127   05/13/22 1556  Multiple Myeloma Panel (SPEP&IFE w/QIG)  Once,   R        05/13/22 1555   05/13/22 1556  UPEP/UIFE/Light Chains/TP, 24-Hr Ur  Once,   R        05/13/22 1555   05/13/22 1518  Sodium, urine, random  Once,   R        05/13/22 1518   05/13/22 1518  Osmolality, urine  Once,   R        05/13/22 1518          Data  Reviewed: I have personally reviewed following labs and imaging studies CBC: Recent Labs  Lab 05/13/22 1130 05/13/22 1639 05/14/22 0459 05/14/22 1315 05/16/22 0339 05/17/22 0425 05/18/22 0137 05/19/22 0309  WBC 10.3  --  8.9  --  12.4* 10.1 9.6 7.7  NEUTROABS 4.1  --   --   --  7.0  --   --   --   HGB 6.7*   < > 6.7* 7.1* 9.0* 9.6* 10.6* 10.7*  HCT 19.9*   < > 21.4* 21.9* 27.8* 28.9* 32.6* 32.2*  MCV 93.0  --  91.8  --  90.8 89.2 89.8 88.7  PLT 93*  --  78*  --  52* 53* 44* 61*   < > = values in this interval not displayed.    Basic Metabolic Panel: Recent Labs  Lab 05/13/22 1836 05/14/22 0459 05/15/22 1507 05/16/22 2223 05/17/22 0425 05/18/22 0137 05/18/22 2050 05/19/22 0309  NA 129* 133*   < > 124* 129* 131* 129* 132*  K 3.8 4.4   < > 4.3 3.8 3.8 3.8 3.3*  CL 99 103   < > 96* 99 103 102 103  CO2 23 23   < > _0 GLUCOSE 183* 160*   < > 356* 286* 192* 149* 113*  BUN 22* 22*   < > 26* 27* 20 27* 23*  CREATININE 1.02 0.90   < > 0.78 0.75 0.72 0.88 0.68  CALCIUM 10.2 9.6   < > 8.6* 8.7* 8.6* 7.9* 8.1*  PHOS 5.5* 3.7  --   --   --   --   --   --    < > =  values in this interval not displayed.    GFR: Estimated Creatinine Clearance: 94.1 mL/min (by C-G formula based on SCr of 0.68 mg/dL). Liver Function Tests: Recent Labs  Lab 05/13/22 1130 05/13/22 1836 05/14/22 0459 05/16/22 0339  AST 29  --  30 110*  ALT 42  --  41 35  ALKPHOS 43  --  43 37*  BILITOT 0.4  --  0.5 0.2*  PROT >12.0*  --  >12.0* RESULTS UNAVAILABLE DUE TO INTERFERING SUBSTANCE  ALBUMIN 1.6* 1.6* 1.5* 3.0*    No results for input(s): LIPASE, AMYLASE in the last 168 hours. No results for input(s): AMMONIA in the last 168 hours. Coagulation Profile: No results for input(s): INR, PROTIME in the last 168 hours. BNP (last 3 results) No results for input(s): PROBNP in the last 8760 hours. HbA1C: No results for input(s): HGBA1C in the last 72 hours.  CBG: Recent Labs  Lab  05/18/22 0740 05/18/22 1129 05/18/22 1627 05/18/22 2119 05/19/22 0807  GLUCAP 160* 198* 222* 159* 103*    Lipid Profile: No results for input(s): CHOL, HDL, LDLCALC, TRIG, CHOLHDL, LDLDIRECT in the last 72 hours. Thyroid Function Tests: No results for input(s): TSH, T4TOTAL, FREET4, T3FREE, THYROIDAB in the last 72 hours. Sepsis Labs: No results for input(s): PROCALCITON, LATICACIDVEN in the last 168 hours.  No results found for this or any previous visit (from the past 240 hour(s)).  Antimicrobials: Anti-infectives (From admission, onward)    None      Culture/Microbiology No results found for: SDES, SPECREQUEST, CULT, REPTSTATUS  Other culture-see note  Radiology Studies: No results found.   LOS: 5 days   Antonieta Pert, MD [facility]  05/19/2022, 10:39 AM

## 2022-05-19 NOTE — Progress Notes (Cosign Needed)
  Scheduled today for plasma pheresis cath removal after treatment today. Plan was for consideration for PAC placement at same time if possible.  Discussion with Dr Dwaine Gale Milind Rad --- PAC will need to be scheduled as OP.  LM to Admit MD and HemOnc

## 2022-05-19 NOTE — Telephone Encounter (Signed)
Spoke with Donald Suarez for Mr. Bramhall. Told her that the chemotherapy education appointment was changed from 4 pm today to 2 pm tomorrow after Dr. Grier Mitts visit at 1 pm if patient is discharged today.

## 2022-05-19 NOTE — Procedures (Signed)
Temp Cath was removed and bandaged placed after hemostasis achieved.   [facility] RT(R)

## 2022-05-19 NOTE — Progress Notes (Signed)
Inpatient Diabetes Program Recommendations  AACE/ADA: New Consensus Statement on Inpatient Glycemic Control (2015)  Target Ranges:  Prepandial:   less than 140 mg/dL      Peak postprandial:   less than 180 mg/dL (1-2 hours)      Critically ill patients:  140 - 180 mg/dL   Lab Results  Component Value Date   GLUCAP 152 (H) 05/19/2022   HGBA1C 8.2 (H) 05/13/2022    Review of Glycemic Control  Diabetes history: New DM Diagnosis  Current orders for Inpatient glycemic control:  Semglee 15 units Daily at bedtime Novolog 5 units tid meal coverage Novolog 0-15 units tid + hs  If pt is requiring meal coverage at time of d/c, 70/30 insulin maybe a choice to use outpt to simplify the insulin regimen.  Spoke with pt in the room with Marge Duncans, over speaker phone. We discussed the Diabetes diagnosis, Discussed the need for insulin at time of d/c. Showed pt and Nephew insulin pen administration over Face time. Discussed diet and lifestyle modifications for glucose control. Discussed hypoglycemia s/s and treatment options.   Pt to self inject insulin and perform fingerstick prior to d/c.  D/c needs: Insulin pen needles order # [id number]  Thanks,  Tama Headings RN, MSN, BC-ADM Inpatient Diabetes Coordinator Team Pager [phone] (8a-5p)

## 2022-05-19 NOTE — Procedures (Signed)
Arrived to the hemodialysis unit in stable condition.  BP upon arrival was 126/77, Vascath to RIJ, Venous port easily draws and flushes, Arterial port flushes but is sluggish for blood draws.  Dressing is intact and occlusive.  Pt states he feels well and had a good weekend. His english is very limited.  Treatment was uneventful and following treatment his BP was 114/70. Replacement fluid 3010 Plasma Removed 3212

## 2022-05-20 ENCOUNTER — Inpatient Hospital Stay: Payer: BC Managed Care – PPO | Attending: Hematology | Admitting: Hematology

## 2022-05-20 ENCOUNTER — Other Ambulatory Visit (HOSPITAL_COMMUNITY): Payer: Self-pay

## 2022-05-20 ENCOUNTER — Encounter: Payer: Self-pay | Admitting: Hematology

## 2022-05-20 ENCOUNTER — Other Ambulatory Visit: Payer: Self-pay

## 2022-05-20 ENCOUNTER — Inpatient Hospital Stay: Payer: BC Managed Care – PPO | Attending: Hematology

## 2022-05-20 ENCOUNTER — Inpatient Hospital Stay: Payer: BC Managed Care – PPO

## 2022-05-20 VITALS — BP 129/85 | HR 90 | Temp 97.9°F | Resp 20 | Wt 161.1 lb

## 2022-05-20 DIAGNOSIS — E1165 Type 2 diabetes mellitus with hyperglycemia: Secondary | ICD-10-CM | POA: Insufficient documentation

## 2022-05-20 DIAGNOSIS — D751 Secondary polycythemia: Secondary | ICD-10-CM | POA: Insufficient documentation

## 2022-05-20 DIAGNOSIS — E119 Type 2 diabetes mellitus without complications: Secondary | ICD-10-CM

## 2022-05-20 DIAGNOSIS — D649 Anemia, unspecified: Secondary | ICD-10-CM | POA: Insufficient documentation

## 2022-05-20 DIAGNOSIS — C9000 Multiple myeloma not having achieved remission: Secondary | ICD-10-CM | POA: Insufficient documentation

## 2022-05-20 DIAGNOSIS — H538 Other visual disturbances: Secondary | ICD-10-CM

## 2022-05-20 DIAGNOSIS — Z5112 Encounter for antineoplastic immunotherapy: Secondary | ICD-10-CM | POA: Insufficient documentation

## 2022-05-20 DIAGNOSIS — D696 Thrombocytopenia, unspecified: Secondary | ICD-10-CM | POA: Insufficient documentation

## 2022-05-20 DIAGNOSIS — Z794 Long term (current) use of insulin: Secondary | ICD-10-CM | POA: Insufficient documentation

## 2022-05-20 LAB — UPEP/UIFE/LIGHT CHAINS/TP, 24-HR UR
% BETA, Urine: 20 %
ALPHA 1 URINE: 2.9 %
Albumin, U: 21.1 %
Alpha 2, Urine: 14.3 %
Free Kappa Lt Chains,Ur: 3.39 mg/L (ref 1.17–86.46)
Free Kappa/Lambda Ratio: 0.26 — ABNORMAL LOW (ref 1.83–14.26)
Free Lambda Lt Chains,Ur: 12.82 mg/L (ref 0.27–15.21)
GAMMA GLOBULIN URINE: 41.7 %
M-SPIKE %, Urine: 12.2 % — ABNORMAL HIGH
M-Spike, Mg/24 Hr: 36 mg/24 hr — ABNORMAL HIGH
Total Protein, Urine-Ur/day: 296 mg/24 hr — ABNORMAL HIGH (ref 30–150)
Total Protein, Urine: 5.7 mg/dL
Total Volume: 5200

## 2022-05-20 LAB — BASIC METABOLIC PANEL
Anion gap: 5 (ref 5–15)
BUN: 16 mg/dL (ref 6–20)
CO2: 22 mmol/L (ref 22–32)
Calcium: 8.2 mg/dL — ABNORMAL LOW (ref 8.9–10.3)
Chloride: 104 mmol/L (ref 98–111)
Creatinine, Ser: 0.72 mg/dL (ref 0.61–1.24)
GFR, Estimated: >60 mL/min (ref >60)
Glucose, Bld: 128 mg/dL — ABNORMAL HIGH (ref 70–99)
Potassium: 3.3 mmol/L — ABNORMAL LOW (ref 3.5–5.1)
Sodium: 131 mmol/L — ABNORMAL LOW (ref 135–145)

## 2022-05-20 LAB — CBC
HCT: 33.7 % — ABNORMAL LOW (ref 39.0–52.0)
Hemoglobin: 11 g/dL — ABNORMAL LOW (ref 13.0–17.0)
MCH: 29.3 pg (ref 26.0–34.0)
MCHC: 32.6 g/dL (ref 30.0–36.0)
MCV: 89.9 fL (ref 80.0–100.0)
Platelets: 60 10*3/uL — ABNORMAL LOW (ref 150–400)
RBC: 3.75 MIL/uL — ABNORMAL LOW (ref 4.22–5.81)
RDW: 19.4 % — ABNORMAL HIGH (ref 11.5–15.5)
WBC: 8.7 10*3/uL (ref 4.0–10.5)
nRBC: 17.3 % — ABNORMAL HIGH (ref 0.0–0.2)

## 2022-05-20 LAB — GLUCOSE, CAPILLARY
Glucose-Capillary: 133 mg/dL — ABNORMAL HIGH (ref 70–99)
Glucose-Capillary: 120 mg/dL — ABNORMAL HIGH (ref 70–99)

## 2022-05-20 LAB — IGG, IGA, IGM
IgA: <5 mg/dL — ABNORMAL LOW (ref 90–386)
IgG (Immunoglobin G), Serum: 4073 mg/dL — ABNORMAL HIGH (ref 603–1613)
IgM (Immunoglobulin M), Srm: <5 mg/dL — ABNORMAL LOW (ref 20–172)

## 2022-05-20 MED ORDER — ACCU-CHEK SOFTCLIX LANCETS MISC
0 refills | Status: DC
  Filled 2022-05-20: qty 100, 25d supply, fill #0

## 2022-05-20 MED ORDER — POTASSIUM CHLORIDE CRYS ER 20 MEQ PO TBCR
40.0000 meq | EXTENDED_RELEASE_TABLET | Freq: Once | ORAL | Status: AC
  Administered 2022-05-20: 40 meq via ORAL
  Filled 2022-05-20: qty 2

## 2022-05-20 MED ORDER — INSULIN ASPART PROT & ASPART (70-30 MIX) 100 UNIT/ML PEN
10.0000 [IU] | PEN_INJECTOR | Freq: Two times a day (BID) | SUBCUTANEOUS | 2 refills | Status: DC
  Filled 2022-05-20: qty 6, 30d supply, fill #0

## 2022-05-20 MED ORDER — INSULIN PEN NEEDLE 32G X 4 MM MISC
1.0000 [IU] | Freq: Two times a day (BID) | 1 refills | Status: AC
  Filled 2022-05-20: qty 100, 50d supply, fill #0

## 2022-05-20 MED ORDER — ACCU-CHEK GUIDE VI STRP
ORAL_STRIP | 0 refills | Status: DC
  Filled 2022-05-20: qty 100, 25d supply, fill #0

## 2022-05-20 MED ORDER — ACCU-CHEK GUIDE W/DEVICE KIT
PACK | 0 refills | Status: AC
  Filled 2022-05-20: qty 1, 30d supply, fill #0

## 2022-05-20 NOTE — Progress Notes (Signed)
Patient discharged to home with home health services. Discharge paperwork reviewed with patient and family member, Donald Suarez. Education provided on diabetes management. Patient and family stated that education had been provided already on checking cbg and insulin administration and declined offer to demonstrate. Medications provided by TOC. Patient and family declined further questions at this time.

## 2022-05-20 NOTE — Procedures (Addendum)
   I was present at this plasmapheresis session, have reviewed the session itself and made  appropriate changes Kelly Splinter MD [place] pager [phone]   05/19/2022, 12:03 PM

## 2022-05-20 NOTE — Progress Notes (Signed)
Inpatient Diabetes Program Recommendations  AACE/ADA: New Consensus Statement on Inpatient Glycemic Control (2015)  Target Ranges:  Prepandial:   less than 140 mg/dL      Peak postprandial:   less than 180 mg/dL (1-2 hours)      Critically ill patients:  140 - 180 mg/dL   Lab Results  Component Value Date   GLUCAP 133 (H) 05/20/2022   HGBA1C 8.2 (H) 05/13/2022    Review of Glycemic Control  Latest Reference Range & Units 05/19/22 08:07 05/19/22 11:55 05/19/22 16:48 05/19/22 21:07 05/20/22 07:27  Glucose-Capillary 70 - 99 mg/dL 103 (H) 152 (H) 177 (H) 169 (H) 133 (H)  (H): Data is abnormally high  Diabetes history: New DM Diagnosis   Current orders for Inpatient glycemic control:  Semglee 15 units Daily at bedtime Novolog 5 units tid meal coverage Novolog 0-15 units tid + hs  Inpatient Diabetes Program Recommendations:   Received request from Dr. Lupita Leash for recommendations if patient transitioned to 70/30 insulin. -70/30 10 units bid ac meals would equal 14 units basal, 6 units meal coverage Secure chat sent to Dr. Lupita Leash.  Thank you, Nani Gasser. Mariadelosang Wynns, RN, MSN, CDE  Diabetes Coordinator Inpatient Glycemic Control Team Team Pager [phone] (8am-5pm) 05/20/2022 11:06 AM

## 2022-05-20 NOTE — TOC Transition Note (Addendum)
Transition of Care [facility]) - CM/SW Discharge Note   Patient Details  Name: Donald Suarez MRN: [id number] Date of Birth: [birth year]  [facility] CM/SW Contact:  Tom-Johnson, Renea Ee, RN Phone Number: 05/20/2022, 12:39 PM   Clinical Narrative:     Patient is scheduled for discharge today. Completed scheduled Plasmapheresis.  Home health RN ordered for Disease Management for new onset Diabetes and Insulin education.  Hosp f/u with [facility] and info on AVS. Home Health RN referral with [facility] per patient's request. Delsa Sale notified and acceptance voiced, info on AVS.  Family to transport at discharge. No further TOC needs noted.  Final next level of care: [place] Barriers to Discharge: Barriers Resolved   Patient Goals and CMS Choice Patient states their goals for this hospitalization and ongoing recovery are:: To return home CMS Medicare.gov Compare Post Acute Care list provided to:: Patient Choice offered to / list presented to : Patient  Discharge Placement                Patient to be transferred to facility by: Family      Discharge Plan and Services   Discharge Planning Services: CM Consult, Medication Assistance, Follow-up appt scheduled Post Acute Care Choice: NA          DME Arranged: N/A DME Agency: NA       HH Arranged: RN, Disease Management [place] Agency: [facility] [place] Date [place] Agency Contacted: 05/20/22 Time HH Agency Contacted: 2010 Representative spoke with at [place]: [place] Determinants of Health ([place]) Interventions     Readmission Risk Interventions     View : No data to display.

## 2022-05-20 NOTE — Discharge Summary (Signed)
Physician Discharge Summary  Donald Suarez KVQ:[id number] DOB: 1966-07-04 DOA: 05/13/2022  PCP: Pcp, No  Admit date: 05/13/2022 Discharge date: 05/20/2022 Recommendations for Outpatient Follow-up:  Follow up with PCP in 1 weeks-call for appointment Follow-up with oncology office today. Please obtain BMP/CBC in one week  Discharge Dispo: home Discharge Condition: Stable Code Status:   Code Status: Full Code Diet recommendation:  Diet Order             Diet Carb Modified Fluid consistency: Thin; Room service appropriate? Yes  Diet effective now                    Brief/Interim Summary: 56 year old male with history of multiple myeloma who was lost to follow-up presented to the hospital with fatigue and dizziness he also complained of headache and some blurring of vision.  He was noted to have hemoglobin of 6 on admission with a platelets of 78,000.  Patient was admitted, transfused 2 units.  Patient seen by oncology Dr Irene Limbo concerned for hyperviscosity and needing plasmapheresis.  Patient was  transferred to [facility] for the same. Nephrology and interventional radiology were consulted for the same  for porta a cath-completed first session of plasmapheresis 5/18 and repeating plasmapheresis 5/20 and 5/22.  Patient was found to have uncontrolled hyperglycemia A1c 8s, seen by diabetic coordinator placed on insulin.  Patient underwent multiple sessions of plasmapheresis at this time he remains medically stable.  Insulin dosing adjusted after he completed his steroid therapy.  He is comfortable with insulin injection diabetic teaching family were involved.  At this time is medically stable discharge home.     Discussed with DM coordinator being discharged on NovoLog 70/30 10 units twice daily. 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 Seen by oncology recommending plasmapheresis given concern for hyperviscosity especially after receiving blood transfusion, transferred to [facility] [place]-for Port-A-Cath and nephrology on board.  S/p plasmapheresis 5/18,5/20 and one more 5/22.Will need PET as outpatient, on dexamethasone 40 mg daily for 4 doses- last  4/20. He has appointment to discuss chemotherapy 5/23 and is being discharged home today.     Symptomatic anemia:s/p 2 units PRBC, improved and stable.  Monitor    Hypercalcemia due to multiple myeloma oncology recommends bisphosphonates as outpatient.  Monitor calcium Mild hyponatremia -also component of pseudohyponatremia in the setting of hyperglycemia.  Monitor  Severe hypoalbuminemia in the setting of multiple myeloma.  Augment diet Thrombocytopenia due to multiple myeloma.  Platelet improving in 60k, follow-up outpatient.   New onset type 2 diabetes with uncontrolled hyperglycemia:A1c 8.2 blood sugar poorly controlled in 600s-also due to patient seen getting dexamethasone high-dose-last dose 5/20.  Blood sugar now downtrending insulin was decreased at this time patient is comfortable doing insulin injection Accu-Chek he was educated on hyperglycemia as well as hypoglycemia how to recognize and avoid.  We will discharge him on 70/30, 10 u bid. Recent Labs  Lab 05/13/22 1639 05/16/22 1646 05/19/22 0807 05/19/22 1155 05/19/22 1648 05/19/22 2107 05/20/22 0727  GLUCAP  --    < > 103* 152* 177* 169* 133*  HGBA1C 8.2*  --   --   --   --   --   --    < > =  values in this interval not displayed.      Hypokalemia monitor  Consults: Heme-onc, nephrology Subjective: Alert awake resting comfortably, eager to go home today.  Discharge Exam: Vitals:    05/20/22 0453 05/20/22 0931  BP: 108/74 126/76  Pulse: 86 85  Resp: 18 18  Temp: 98.3 F (36.8 C) 98.5 F (36.9 C)  SpO2: 99% 100%   General: Pt is alert, awake, not in acute distress Cardiovascular: RRR, S1/S2 +, no rubs, no gallops Respiratory: CTA bilaterally, no wheezing, no rhonchi Abdominal: Soft, NT, ND, bowel sounds + Extremities: no edema, no cyanosis  Discharge Instructions  Discharge Instructions     Discharge instructions   Complete by: As directed    Please call call MD or return to ER for similar or worsening recurring problem that brought you to hospital or if any fever,nausea/vomiting,abdominal pain, uncontrolled pain, chest pain,  shortness of breath or any other alarming symptoms.  Check blood sugar 3 times a day and bedtime at home. If blood sugar running above 200 less than 70 please call your MD to adjust insulin. If blood sugars running less 100 do not use insulin and call MD. If you noticed signs and symptoms of hypoglycemia or low blood sugar like jitteriness, confusion, thirst, tremor, sweating- Check blood sugar, drink sugary drink/biscuits/sweets to increase sugar level and call MD or return to ER.  Please follow-up your doctor as instructed in a week time and call the office for appointment.  Please avoid alcohol, smoking, or any other illicit substance and maintain healthy habits including taking your regular medications as prescribed.  You were cared for by a hospitalist during your hospital stay. If you have any questions about your discharge medications or the care you received while you were in the hospital after you are discharged, you can call the unit and ask to speak with the hospitalist on call if the hospitalist that took care of you is not available.  Once you are discharged, your primary care physician will handle any further medical issues. Please note that NO REFILLS for any discharge medications will be authorized once you are discharged,  as it is imperative that you return to your primary care physician (or establish a relationship with a primary care physician if you do not have one) for your aftercare needs so that they can reassess your need for medications and monitor your lab values   Increase activity slowly   Complete by: As directed       Allergies as of 05/20/2022   No Known Allergies      Medication List     STOP taking these medications    Advil 200 MG tablet Generic drug: ibuprofen       TAKE these medications    blood glucose meter kit and supplies Kit Dispense based on patient and insurance preference. Use up to four times daily as directed.   insulin aspart protamine - aspart (70-30) 100 UNIT/ML FlexPen Commonly known as: NOVOLOG 70/30 MIX Inject 10 Units into the skin 2 (two) times daily.   Insulin Pen Needle 32G X 4 MM Misc 1 Units by Does not apply route 2 (two) times daily.   zolpidem 10 MG tablet Commonly known as: AMBIEN Take 10 mg by mouth at bedtime as needed for sleep.        Follow-up [place] Follow up in 1 week(s).   Contact information: [address]  [place] [id number] [phone]               No Known Allergies  The results of significant diagnostics from this hospitalization (including imaging, microbiology, ancillary and laboratory) are listed below for reference.    Microbiology: No results found for this or any previous visit (from the past 240 hour(s)).  Procedures/Studies: Reuben Fluoro Guide CV Line Right  Result Date: 05/15/2022 INDICATION: 56 year old male referred for temporary hemodialysis catheter for plasmapheresis purpose EXAM: IMAGE GUIDED TEMPORARY HEMODIALYSIS CATHETER MEDICATIONS: None ANESTHESIA/SEDATION: None FLUOROSCOPY: Radiation Exposure Index (as provided by the fluoroscopic device): 1 mGy Kerma COMPLICATIONS: None PROCEDURE: Informed written consent was  obtained from the patient's family after a discussion of the risks, benefits, and alternatives to treatment. Questions regarding the procedure were encouraged and answered. The right neck was prepped with chlorhexidine in a sterile fashion, and a sterile drape was applied covering the operative field. Maximum barrier sterile technique with sterile gowns and gloves were used for the procedure. A timeout was performed prior to the initiation of the procedure. A micropuncture kit was utilized to access the right internal jugular vein under direct, real-time ultrasound guidance after the overlying soft tissues were anesthetized with 1% lidocaine with epinephrine. Ultrasound image documentation was performed. The microwire was kinked to measure appropriate catheter length. A stiff glidewire was advanced to the level of the IVC. A 16 cm hemodialysis catheter was then placed over the wire. Final catheter positioning was confirmed and documented with a spot radiographic image. The catheter aspirates and flushes normally. The catheter was flushed with appropriate volume heparin dwells. Dressings were applied. The patient tolerated the procedure well without immediate post procedural complication. IMPRESSION: Status post right IJ temporary hemodialysis catheter. Signed, Dulcy Fanny. Dellia Nims, RPVI [facility] Radiology Electronically Signed   By: Corrie Mckusick D.O.   On: 05/15/2022 09:57   Love US Guide Vasc Access Right  Result Date: 05/15/2022 INDICATION: 56 year old male referred for temporary hemodialysis catheter for plasmapheresis purpose EXAM: IMAGE GUIDED TEMPORARY HEMODIALYSIS CATHETER MEDICATIONS: None ANESTHESIA/SEDATION: None FLUOROSCOPY: Radiation Exposure Index (as provided by the fluoroscopic device): 1 mGy Kerma COMPLICATIONS: None PROCEDURE: Informed written consent was obtained from the patient's family after a discussion of the risks, benefits, and  alternatives to treatment. Questions regarding the procedure were encouraged and answered. The right neck was prepped with chlorhexidine in a sterile fashion, and a sterile drape was applied covering the operative field. Maximum barrier sterile technique with sterile gowns and gloves were used for the procedure. A timeout was performed prior to the initiation of the procedure. A micropuncture kit was utilized to access the right internal jugular vein under direct, real-time ultrasound guidance after the overlying soft tissues were anesthetized with 1% lidocaine with epinephrine. Ultrasound image documentation was performed. The microwire was kinked to measure appropriate catheter length. A stiff glidewire was advanced to the level of the IVC. A 16 cm hemodialysis catheter was then placed over the wire. Final catheter positioning was confirmed and documented with a spot radiographic image. The catheter aspirates and flushes normally. The catheter was flushed with appropriate volume heparin dwells. Dressings were applied. The patient tolerated the procedure well without immediate post procedural complication. IMPRESSION: Status post right IJ temporary hemodialysis catheter. Signed, Dulcy Fanny. Dellia Nims, RPVI [facility] Radiology Electronically Signed   By: Corrie Mckusick D.O.   On: 05/15/2022 09:57   DG Chest Port 1 View  Result Date:  05/13/2022 CLINICAL DATA:  Weakness EXAM: PORTABLE CHEST 1 VIEW COMPARISON:  None Available. FINDINGS: The heart size and mediastinal contours are within normal limits. Slightly low lung volumes. No focal airspace consolidation, pleural effusion, or pneumothorax. The visualized skeletal structures are unremarkable. IMPRESSION: No active disease. Electronically Signed   By: Davina Poke D.O.   On: 05/13/2022 13:03   DG Bone Survey Met  Result Date: 05/14/2022 CLINICAL DATA:  Multiple myeloma. EXAM: METASTATIC BONE SURVEY  COMPARISON:  None Available. FINDINGS: Multiple small rounded lucencies are noted in the skull, the largest noted in the frontal region. Small rounded lucency is noted in proximal right humeral shaft. Small rounded lucency is noted in the proximal right femoral shaft. No other abnormality is noted in the skeleton. IMPRESSION: Small rounded lucencies are noted in the skull, proximal right humerus and proximal right femur concerning for multiple myeloma. Electronically Signed   By: Marijo Conception M.D.   On: 05/14/2022 15:34   [place] PATIENT EVAL TECH 0-60 MINS  Result Date: 05/19/2022 Tonye Pearson, RT     05/19/2022  2:31 PM Temp Cath was removed and bandaged placed after hemostasis achieved. Melanie Hill RT(R)    Labs: BNP (last 3 results) No results for input(s): BNP in the last 8760 hours. Basic Metabolic Panel: Recent Labs  Lab 05/13/22 1836 05/14/22 0459 05/15/22 1507 05/17/22 0425 05/18/22 0137 05/18/22 2050 05/19/22 0309 05/20/22 0611  NA 129* 133*   < > 129* 131* 129* 132* 131*  K 3.8 4.4   < > 3.8 3.8 3.8 3.3* 3.3*  CL 99 103   < > 99 103 102 103 104  CO2 23 23   < > 22 23 23 24 22   GLUCOSE 183* 160*   < > 286* 192* 149* 113* 128*  BUN 22* 22*   < > 27* 20 27* 23* 16  CREATININE 1.02 0.90   < > 0.75 0.72 0.88 0.68 0.72  CALCIUM 10.2 9.6   < > 8.7* 8.6* 7.9* 8.1* 8.2*  PHOS 5.5* 3.7  --   --   --   --   --   --    < > = values in this interval not displayed.   Liver Function Tests: Recent Labs  Lab 05/13/22 1130 05/13/22 1836 05/14/22 0459 05/16/22 0339  AST 29  --  30 110*  ALT 42  --  41 35  ALKPHOS 43  --  43 37*  BILITOT 0.4  --  0.5 0.2*  PROT >12.0*  --  >12.0* RESULTS UNAVAILABLE DUE TO INTERFERING SUBSTANCE  ALBUMIN 1.6* 1.6* 1.5* 3.0*   No results for input(s): LIPASE, AMYLASE in the last 168 hours. No results for input(s): AMMONIA in the last 168 hours. CBC: Recent Labs  Lab 05/13/22 1130 05/13/22 1639 05/16/22 0339 05/17/22 0425  05/18/22 0137 05/19/22 0309 05/20/22 0611  WBC 10.3   < > 12.4* 10.1 9.6 7.7 8.7  NEUTROABS 4.1  --  7.0  --   --   --   --   HGB 6.7*   < > 9.0* 9.6* 10.6* 10.7* 11.0*  HCT 19.9*   < > 27.8* 28.9* 32.6* 32.2* 33.7*  MCV 93.0   < > 90.8 89.2 89.8 88.7 89.9  PLT 93*   < > 52* 53* 44* 61* 60*   < > = values in this interval not displayed.   Cardiac Enzymes: No results for input(s): CKTOTAL, CKMB, CKMBINDEX, TROPONINI in the last 168 hours. BNP:  Invalid input(s): POCBNP CBG: Recent Labs  Lab 05/19/22 0807 05/19/22 1155 05/19/22 1648 05/19/22 2107 05/20/22 0727  GLUCAP 103* 152* 177* 169* 133*   D-Dimer No results for input(s): DDIMER in the last 72 hours. Hgb A1c No results for input(s): HGBA1C in the last 72 hours. Lipid Profile No results for input(s): CHOL, HDL, LDLCALC, TRIG, CHOLHDL, LDLDIRECT in the last 72 hours. Thyroid function studies No results for input(s): TSH, T4TOTAL, T3FREE, THYROIDAB in the last 72 hours.  Invalid input(s): FREET3 Anemia work up No results for input(s): VITAMINB12, FOLATE, FERRITIN, TIBC, IRON, RETICCTPCT in the last 72 hours. Urinalysis    Component Value Date/Time   COLORURINE STRAW (A) 05/15/2022 1755   APPEARANCEUR CLEAR 05/15/2022 1755   LABSPEC 1.018 05/15/2022 1755   PHURINE 6.0 05/15/2022 1755   GLUCOSEU >=500 (A) 05/15/2022 1755   HGBUR NEGATIVE 05/15/2022 1755   BILIRUBINUR NEGATIVE 05/15/2022 1755   KETONESUR NEGATIVE 05/15/2022 1755   PROTEINUR NEGATIVE 05/15/2022 1755   NITRITE NEGATIVE 05/15/2022 1755   LEUKOCYTESUR NEGATIVE 05/15/2022 1755   Sepsis Labs Invalid input(s): PROCALCITONIN,  WBC,  LACTICIDVEN Microbiology No results found for this or any previous visit (from the past 240 hour(s)). Time coordinating discharge: 25 minutes  SIGNED: Antonieta Pert, MD  [facility] 05/20/2022, 11:16 AM  If 7PM-7AM, please contact night-coverage [email or web address]

## 2022-05-21 ENCOUNTER — Telehealth: Payer: Self-pay | Admitting: Pharmacy Technician

## 2022-05-21 ENCOUNTER — Encounter: Payer: Self-pay | Admitting: Hematology

## 2022-05-21 ENCOUNTER — Telehealth: Payer: Self-pay | Admitting: Pharmacist

## 2022-05-21 ENCOUNTER — Inpatient Hospital Stay: Payer: BC Managed Care – PPO

## 2022-05-21 ENCOUNTER — Encounter: Payer: Self-pay | Admitting: Licensed Clinical Social Worker

## 2022-05-21 ENCOUNTER — Other Ambulatory Visit (HOSPITAL_COMMUNITY): Payer: Self-pay

## 2022-05-21 VITALS — BP 116/78 | HR 92 | Temp 99.5°F | Resp 17

## 2022-05-21 DIAGNOSIS — Z794 Long term (current) use of insulin: Secondary | ICD-10-CM | POA: Diagnosis not present

## 2022-05-21 DIAGNOSIS — D649 Anemia, unspecified: Secondary | ICD-10-CM | POA: Diagnosis not present

## 2022-05-21 DIAGNOSIS — D696 Thrombocytopenia, unspecified: Secondary | ICD-10-CM | POA: Diagnosis not present

## 2022-05-21 DIAGNOSIS — Z7189 Other specified counseling: Secondary | ICD-10-CM

## 2022-05-21 DIAGNOSIS — Z5112 Encounter for antineoplastic immunotherapy: Secondary | ICD-10-CM | POA: Diagnosis not present

## 2022-05-21 DIAGNOSIS — E1165 Type 2 diabetes mellitus with hyperglycemia: Secondary | ICD-10-CM | POA: Diagnosis not present

## 2022-05-21 DIAGNOSIS — D751 Secondary polycythemia: Secondary | ICD-10-CM | POA: Diagnosis not present

## 2022-05-21 DIAGNOSIS — C9000 Multiple myeloma not having achieved remission: Secondary | ICD-10-CM | POA: Diagnosis present

## 2022-05-21 LAB — CMP (CANCER CENTER ONLY)
ALT: 36 U/L (ref 0–44)
AST: 23 U/L (ref 15–41)
Albumin: 3.2 g/dL — ABNORMAL LOW (ref 3.5–5.0)
Alkaline Phosphatase: 39 U/L (ref 38–126)
Anion gap: 4 — ABNORMAL LOW (ref 5–15)
BUN: 15 mg/dL (ref 6–20)
CO2: 25 mmol/L (ref 22–32)
Calcium: 7.7 mg/dL — ABNORMAL LOW (ref 8.9–10.3)
Chloride: 100 mmol/L (ref 98–111)
Creatinine: 0.86 mg/dL (ref 0.61–1.24)
GFR, Estimated: >60 mL/min (ref >60)
Glucose, Bld: 262 mg/dL — ABNORMAL HIGH (ref 70–99)
Potassium: 3.6 mmol/L (ref 3.5–5.1)
Sodium: 129 mmol/L — ABNORMAL LOW (ref 135–145)
Total Bilirubin: 0.5 mg/dL (ref 0.3–1.2)
Total Protein: 7.7 g/dL (ref 6.5–8.1)

## 2022-05-21 LAB — CBC WITH DIFFERENTIAL (CANCER CENTER ONLY)
Abs Immature Granulocytes: 0.52 10*3/uL — ABNORMAL HIGH (ref 0.00–0.07)
Basophils Absolute: 0 10*3/uL (ref 0.0–0.1)
Basophils Relative: 1 %
Eosinophils Absolute: 0.2 10*3/uL (ref 0.0–0.5)
Eosinophils Relative: 2 %
HCT: 29.2 % — ABNORMAL LOW (ref 39.0–52.0)
Hemoglobin: 9.7 g/dL — ABNORMAL LOW (ref 13.0–17.0)
Immature Granulocytes: 7 %
Lymphocytes Relative: 35 %
Lymphs Abs: 2.7 10*3/uL (ref 0.7–4.0)
MCH: 29.5 pg (ref 26.0–34.0)
MCHC: 33.2 g/dL (ref 30.0–36.0)
MCV: 88.8 fL (ref 80.0–100.0)
Monocytes Absolute: 0.7 10*3/uL (ref 0.1–1.0)
Monocytes Relative: 8 %
Neutro Abs: 3.8 10*3/uL (ref 1.7–7.7)
Neutrophils Relative %: 47 %
Platelet Count: 92 10*3/uL — ABNORMAL LOW (ref 150–400)
RBC: 3.29 MIL/uL — ABNORMAL LOW (ref 4.22–5.81)
RDW: 18.9 % — ABNORMAL HIGH (ref 11.5–15.5)
Smear Review: NORMAL
WBC Count: 7.9 10*3/uL (ref 4.0–10.5)
nRBC: 3.2 % — ABNORMAL HIGH (ref 0.0–0.2)

## 2022-05-21 LAB — TYPE AND SCREEN
ABO/RH(D): A POS
Antibody Screen: POSITIVE
DAT, IgG: NEGATIVE
Unit division: 0
Unit division: 0

## 2022-05-21 LAB — LACTATE DEHYDROGENASE: LDH: 222 U/L — ABNORMAL HIGH (ref 98–192)

## 2022-05-21 MED ORDER — ACETAMINOPHEN 500 MG PO TABS
1000.0000 mg | ORAL_TABLET | Freq: Once | ORAL | Status: AC
  Administered 2022-05-21: 1000 mg via ORAL
  Filled 2022-05-21: qty 2

## 2022-05-21 MED ORDER — MONTELUKAST SODIUM 10 MG PO TABS
10.0000 mg | ORAL_TABLET | Freq: Once | ORAL | Status: AC
  Administered 2022-05-21: 10 mg via ORAL
  Filled 2022-05-21: qty 1

## 2022-05-21 MED ORDER — BORTEZOMIB CHEMO SQ INJECTION 3.5 MG (2.5MG/ML)
1.3000 mg/m2 | Freq: Once | INTRAMUSCULAR | Status: AC
  Administered 2022-05-21: 2.5 mg via SUBCUTANEOUS
  Filled 2022-05-21: qty 1

## 2022-05-21 MED ORDER — ACETAMINOPHEN 325 MG PO TABS
650.0000 mg | ORAL_TABLET | Freq: Once | ORAL | Status: AC
  Administered 2022-05-21: 650 mg via ORAL
  Filled 2022-05-21: qty 2

## 2022-05-21 MED ORDER — SODIUM CHLORIDE 0.9 % IV SOLN
16.0000 mg/kg | Freq: Once | INTRAVENOUS | Status: AC
  Administered 2022-05-21: 1300 mg via INTRAVENOUS
  Filled 2022-05-21: qty 60

## 2022-05-21 MED ORDER — DIPHENHYDRAMINE HCL 25 MG PO CAPS
50.0000 mg | ORAL_CAPSULE | Freq: Once | ORAL | Status: AC
  Administered 2022-05-21: 50 mg via ORAL
  Filled 2022-05-21: qty 2

## 2022-05-21 MED ORDER — LENALIDOMIDE 15 MG PO CAPS
15.0000 mg | ORAL_CAPSULE | Freq: Every day | ORAL | 2 refills | Status: DC
  Filled 2022-05-21: qty 21, 21d supply, fill #0

## 2022-05-21 MED ORDER — SODIUM CHLORIDE 0.9 % IV SOLN: Freq: Once | INTRAVENOUS | Status: AC

## 2022-05-21 MED ORDER — PROCHLORPERAZINE MALEATE 10 MG PO TABS
10.0000 mg | ORAL_TABLET | Freq: Four times a day (QID) | ORAL | 0 refills | Status: DC | PRN
Start: 2022-05-21 — End: 2022-07-25
  Filled 2022-05-21 – 2022-06-06 (×2): qty 30, 8d supply, fill #0

## 2022-05-21 MED ORDER — SODIUM CHLORIDE 0.9 % IV SOLN
20.0000 mg | Freq: Once | INTRAVENOUS | Status: AC
  Administered 2022-05-21: 20 mg via INTRAVENOUS
  Filled 2022-05-21: qty 20

## 2022-05-21 MED ORDER — ONDANSETRON HCL 8 MG PO TABS
8.0000 mg | ORAL_TABLET | Freq: Three times a day (TID) | ORAL | 0 refills | Status: DC | PRN
  Filled 2022-05-21 – 2022-06-06 (×2): qty 18, 21d supply, fill #0

## 2022-05-21 MED ORDER — ACYCLOVIR 400 MG PO TABS
400.0000 mg | ORAL_TABLET | Freq: Two times a day (BID) | ORAL | 11 refills | Status: DC
  Filled 2022-05-21 – 2022-06-06 (×2): qty 60, 30d supply, fill #0

## 2022-05-21 MED ORDER — DEXAMETHASONE 4 MG PO TABS
ORAL_TABLET | ORAL | 0 refills | Status: DC
  Filled 2022-05-21 – 2022-06-06 (×2): qty 30, 10d supply, fill #0

## 2022-05-21 NOTE — Progress Notes (Signed)
Ok to proceed with dose entered with initial weight 81.6 kg.  T.O. Dr Cleda Clarks, PharmD

## 2022-05-21 NOTE — Patient Instructions (Signed)
[facility]   Discharge Instructions: Thank you for choosing [facility] to provide your oncology and hematology care.   If you have a lab appointment with the [facility], please go directly to the [facility] and check in at the registration area.   Wear comfortable clothing and clothing appropriate for easy access to any Portacath or PICC line.   We strive to give you quality time with your provider. You may need to reschedule your appointment if you arrive late (15 or more minutes).  Arriving late affects you and other patients whose appointments are after yours.  Also, if you miss three or more appointments without notifying the office, you may be dismissed from the clinic at the provider's discretion.      For prescription refill requests, have your pharmacy contact our office and allow 72 hours for refills to be completed.    Today you received the following chemotherapy and/or immunotherapy agents: bortezomib and daratumumab      To help prevent nausea and vomiting after your treatment, we encourage you to take your nausea medication as directed.  BELOW ARE SYMPTOMS THAT SHOULD BE REPORTED IMMEDIATELY: *FEVER GREATER THAN 100.4 F (38 C) OR HIGHER *CHILLS OR SWEATING *NAUSEA AND VOMITING THAT IS NOT CONTROLLED WITH YOUR NAUSEA MEDICATION *UNUSUAL SHORTNESS OF BREATH *UNUSUAL BRUISING OR BLEEDING *URINARY PROBLEMS (pain or burning when urinating, or frequent urination) *BOWEL PROBLEMS (unusual diarrhea, constipation, pain near the anus) TENDERNESS IN MOUTH AND THROAT WITH OR WITHOUT PRESENCE OF ULCERS (sore throat, sores in mouth, or a toothache) UNUSUAL RASH, SWELLING OR PAIN  UNUSUAL VAGINAL DISCHARGE OR ITCHING   Items with * indicate a potential emergency and should be followed up as soon as possible or go to the Emergency Department if any problems should occur.  Please show the CHEMOTHERAPY ALERT CARD or IMMUNOTHERAPY ALERT  CARD at check-in to the Emergency Department and triage nurse.  Should you have questions after your visit or need to cancel or reschedule your appointment, please contact [facility]  Dept: [phone]  and follow the prompts.  Office hours are 8:00 a.m. to 4:30 p.m. Monday - Friday. Please note that voicemails left after 4:00 p.m. may not be returned until the following business day.  We are closed weekends and major holidays. You have access to a nurse at all times for urgent questions. Please call the main number to the clinic Dept: [phone] and follow the prompts.   For any non-urgent questions, you may also contact your provider using MyChart. We now offer e-Visits for anyone 76 and older to request care online for non-urgent symptoms. For details visit [email or web address].   Also download the MyChart app! Go to the app store, search "MyChart", open the app, select [facility], and log in with your MyChart username and password.  Due to Covid, a mask is required upon entering the hospital/clinic. If you do not have a mask, one will be given to you upon arrival. For doctor visits, patients may have 1 support person aged 53 or older with them. For treatment visits, patients cannot have anyone with them due to current Covid guidelines and our immunocompromised population.   Bortezomib injection What is this medication? BORTEZOMIB (bor TEZ oh mib) targets proteins in cancer cells and stops the cancer cells from growing. It treats multiple myeloma and mantle cell lymphoma. This medicine may be used for other purposes; ask your health care provider or pharmacist  if you have questions. COMMON BRAND NAME(S): Velcade What should I tell my care team before I take this medication? They need to know if you have any of these conditions: dehydration diabetes (high blood sugar) heart disease liver disease tingling of the fingers or toes or other nerve disorder an  unusual or allergic reaction to bortezomib, mannitol, boron, other medicines, foods, dyes, or preservatives pregnant or trying to get pregnant breast-feeding How should I use this medication? This medicine is injected into a vein or under the skin. It is given by a health care provider in a hospital or clinic setting. Talk to your health care provider about the use of this medicine in children. Special care may be needed. Overdosage: If you think you have taken too much of this medicine contact a poison control center or emergency room at once. NOTE: This medicine is only for you. Do not share this medicine with others. What if I miss a dose? Keep appointments for follow-up doses. It is important not to miss your dose. Call your health care provider if you are unable to keep an appointment. What may interact with this medication? This medicine may interact with the following medications: ketoconazole rifampin This list may not describe all possible interactions. Give your health care provider a list of all the medicines, herbs, non-prescription drugs, or dietary supplements you use. Also tell them if you smoke, drink alcohol, or use illegal drugs. Some items may interact with your medicine. What should I watch for while using this medication? Your condition will be monitored carefully while you are receiving this medicine. You may need blood work done while you are taking this medicine. You may get drowsy or dizzy. Do not drive, use machinery, or do anything that needs mental alertness until you know how this medicine affects you. Do not stand up or sit up quickly, especially if you are an older patient. This reduces the risk of dizzy or fainting spells This medicine may increase your risk of getting an infection. Call your health care provider for advice if you get a fever, chills, sore throat, or other symptoms of a cold or flu. Do not treat yourself. Try to avoid being around people who are  sick. Check with your health care provider if you have severe diarrhea, nausea, and vomiting, or if you sweat a lot. The loss of too much body fluid may make it dangerous for you to take this medicine. Do not become pregnant while taking this medicine or for 7 months after stopping it. Women should inform their health care provider if they wish to become pregnant or think they might be pregnant. Men should not father a child while taking this medicine and for 4 months after stopping it. There is a potential for serious harm to an unborn child. Talk to your health care provider for more information. Do not breast-feed an infant while taking this medicine or for 2 months after stopping it. This medicine may make it more difficult to get pregnant or father a child. Talk to your health care provider if you are concerned about your fertility. What side effects may I notice from receiving this medication? Side effects that you should report to your doctor or health care professional as soon as possible: allergic reactions (skin rash; itching or hives; swelling of the face, lips, or tongue) bleeding (bloody or black, tarry stools; red or dark brown urine; spitting up blood or brown material that looks like coffee grounds; red spots on  the skin; unusual bruising or bleeding from the eye, gums, or nose) blurred vision or changes in vision confusion constipation headache heart failure (trouble breathing; fast, irregular heartbeat; sudden weight gain; swelling of the ankles, feet, hands) infection (fever, chills, cough, sore throat, pain or trouble passing urine) lack or loss of appetite liver injury (dark yellow or brown urine; general ill feeling or flu-like symptoms; loss of appetite, right upper belly pain; yellowing of the eyes or skin) low blood pressure (dizziness; feeling faint or lightheaded, falls; unusually weak or tired) muscle cramps pain, redness, or irritation at site where injected pain,  tingling, numbness in the hands or feet seizures trouble breathing unusual bruising or bleeding Side effects that usually do not require medical attention (report to your doctor or health care professional if they continue or are bothersome): diarrhea nausea stomach pain trouble sleeping vomiting This list may not describe all possible side effects. Call your doctor for medical advice about side effects. You may report side effects to FDA at 1-800-FDA-1088. Where should I keep my medication? This medicine is given in a hospital or clinic. It will not be stored at home. NOTE: This sheet is a summary. It may not cover all possible information. If you have questions about this medicine, talk to your doctor, pharmacist, or health care provider.  2023 Elsevier/Gold Standard (2020-12-06 00:00:00)  Daratumumab injection What is this medication? DARATUMUMAB (dar a toom ue mab) is a monoclonal antibody. It is used to treat multiple myeloma. This medicine may be used for other purposes; ask your health care provider or pharmacist if you have questions. COMMON BRAND NAME(S): DARZALEX What should I tell my care team before I take this medication? They need to know if you have any of these conditions: hereditary fructose intolerance infection (especially a virus infection such as chickenpox, herpes, or hepatitis B virus) lung or breathing disease (asthma, COPD) an unusual or allergic reaction to daratumumab, sorbitol, other medicines, foods, dyes, or preservatives pregnant or trying to get pregnant breast-feeding How should I use this medication? This medicine is for infusion into a vein. It is given by a health care professional in a hospital or clinic setting. Talk to your pediatrician regarding the use of this medicine in children. Special care may be needed. Overdosage: If you think you have taken too much of this medicine contact a poison control center or emergency room at once. NOTE: This  medicine is only for you. Do not share this medicine with others. What if I miss a dose? Keep appointments for follow-up doses as directed. It is important not to miss your dose. Call your doctor or health care professional if you are unable to keep an appointment. What may interact with this medication? Interactions have not been studied. This list may not describe all possible interactions. Give your health care provider a list of all the medicines, herbs, non-prescription drugs, or dietary supplements you use. Also tell them if you smoke, drink alcohol, or use illegal drugs. Some items may interact with your medicine. What should I watch for while using this medication? Your condition will be monitored carefully while you are receiving this medicine. This medicine can cause serious allergic reactions. To reduce your risk, your health care provider may give you other medicine to take before receiving this one. Be sure to follow the directions from your health care provider. This medicine can affect the results of blood tests to match your blood type. These changes can last for up to 6  months after the final dose. Your healthcare provider will do blood tests to match your blood type before you start treatment. Tell all of your healthcare providers that you are being treated with this medicine before receiving a blood transfusion. This medicine can affect the results of some tests used to determine treatment response; extra tests may be needed to evaluate response. Do not become pregnant while taking this medicine or for 3 months after stopping it. Women should inform their health care provider if they wish to become pregnant or think they might be pregnant. There is a potential for serious side effects to an unborn child. Talk to your health care provider for more information. Do not breast-feed an infant while taking this medicine. What side effects may I notice from receiving this medication? Side  effects that you should report to your care team as soon as possible: Allergic reactions--skin rash, itching or hives, swelling of the face, lips, or tongue Blurred vision Infection--fever, chills, cough, sore throat, pain or trouble passing urine Infusion reactions--dizziness, fast heartbeat, feeling faint or lightheaded, falls, headache, increase in blood pressure, nausea, vomiting, or wheezing or trouble breathing with loud or whistling sounds Unusual bleeding or bruising Side effects that usually do not require medical attention (report to your care team if they continue or are bothersome): Constipation Diarrhea Pain, tingling, numbness in the hands or feet Swelling of the ankles, feet, hands Tiredness This list may not describe all possible side effects. Call your doctor for medical advice about side effects. You may report side effects to FDA at 1-800-FDA-1088. Where should I keep my medication? This drug is given in a hospital or clinic and will not be stored at home. NOTE: This sheet is a summary. It may not cover all possible information. If you have questions about this medicine, talk to your doctor, pharmacist, or health care provider.  2023 Elsevier/Gold Standard (2021-05-23 00:00:00)

## 2022-05-21 NOTE — Progress Notes (Signed)
Per Dr Irene Limbo, ok to treat with platelet count 92 today.  Per [place] in blood bank, phenotype has been drawn and is in process.  Patient had temp increase to 99.9. Reported to MD. Tylenol '650mg'$  given, temp down to 99.3. Continue with 229m/hr per Dr KIrene Limbo

## 2022-05-21 NOTE — Telephone Encounter (Signed)
Oral Oncology Pharmacist Encounter   Received new prescription for Revlimid (lenalidomide) for the treatment of R/R multiple myeloma in conjunction with daratumumab, bortezomib, and dexamethasone, planned duration until disease progression or unacceptable drug toxicity.   CBC w/ Diff and CMP from 05/21/22 assessed, noted pt pltc of 92 K/uL. Prescription dose and frequency assessed for appropriateness.   Current medication list in Epic reviewed, no relevant/significant DDIs with Revlimid identified.   Evaluated chart and no patient barriers to medication adherence noted.    Once celgene auth number is obtained, prescription will need to be redirected to [facility] - pharmacy added to pharmacy preference list.    Oral Oncology Clinic will continue to follow for insurance authorization, copayment issues, initial counseling and start date.   Ceniya Fowers, PharmD, BCPS Hematology/Oncology Clinical Pharmacist [facility] and [facility] Oral Chemotherapy Navigation Clinics [phone] 05/21/2022 1:55 PM 

## 2022-05-21 NOTE — Telephone Encounter (Signed)
Oral Oncology Patient Advocate Encounter   Received notification that prior authorization for Revlimid is required.   PA submitted on 05/21/2022 Key [id number] Status is pending     Lady Deutscher, CPhT-Adv Pharmacy Patient Advocate Specialist [facility] Patient Advocate Team Direct Number: [phone]  Fax: [phone]

## 2022-05-21 NOTE — Progress Notes (Signed)
[facility] Work  Initial Assessment   Donald Suarez is a 56 y.o. year old male presenting alone. Clinical Social Work was referred by medical provider for assessment of psychosocial needs.  Spoke with pt via phone interpreter Donald Suarez)  SDOH (Social Determinants of Health) assessments performed: Yes SDOH Interventions    Flowsheet Row Most Recent Value  SDOH Interventions   Food Insecurity Interventions Other (Comment)  [food pantry bag]  Financial Strain Interventions Other (Comment)  [referral for disability]  Transportation Interventions Intervention Not Indicated, Patient Resources Tax adviser)       SDOH Screenings   Alcohol Screen: Not on file  Depression (PHQ2-9): Not on file  Financial Resource Strain: Medium Risk   Difficulty of Paying Living Expenses: Somewhat hard  Food Insecurity: Landscape architect Present   Worried About Charity fundraiser in the Last Year: Sometimes true   Arboriculturist in the Last Year: Sometimes true  Housing: Not on file  Physical Activity: Not on file  Social Connections: Not on file  Stress: Not on file  Tobacco Use: Low Risk    Smoking Tobacco Use: Never   Smokeless Tobacco Use: Never   Passive Exposure: Not on file  Transportation Needs: No Transportation Needs   Lack of Transportation (Medical): No   Lack of Transportation (Non-Medical): No     Distress Screen completed: No     View : No data to display.            Family/Social Information:  Housing Arrangement: patient lives with wife, adult daughter Family members/support persons in your life? Family (wife, daughter, nephew) Transportation concerns: no, has family helping  Employment: Out of work due to pain. Job in maintenance at [employer]. Interested in applying for disability.  Income source: Employment Financial concerns: Yes, current concerns Type of concern: Utilities and Food Food access concerns: yes, because of finances.  Religious or spiritual practice:  Not known Services Currently in place:  insurance  Coping/ Adjustment to diagnosis: Patient understands treatment plan and what happens next? yes Concerns about diagnosis and/or treatment: Losing my job and/or losing income How I will pay for the services I need Patient reported stressors: Finances Current coping skills/ strengths: Supportive family/friends     SUMMARY: Current SDOH Barriers:  Financial constraints related to illness and reduced income  Clinical Social Work Clinical Goal(s):  Patient will work with [employer], [place], and community resources to address needs related to financial stress  Interventions: Informed patient of the support team roles and support services at [facility] Provided CSW contact information and encouraged patient to call with any questions or concerns Referred patient to [employer] for disability and food stamp application Provided bag from food pantry today   Follow Up Plan:  CSW will explore other options for assistance and contact pt Patient verbalizes understanding of plan: Yes    Zamorah Ailes E Maryanna Stuber, LCSW

## 2022-05-22 ENCOUNTER — Inpatient Hospital Stay: Payer: BC Managed Care – PPO

## 2022-05-22 ENCOUNTER — Telehealth: Payer: Self-pay | Admitting: Pharmacist

## 2022-05-22 ENCOUNTER — Other Ambulatory Visit (HOSPITAL_COMMUNITY): Payer: Self-pay

## 2022-05-22 ENCOUNTER — Encounter: Payer: Self-pay | Admitting: Hematology

## 2022-05-22 DIAGNOSIS — C9000 Multiple myeloma not having achieved remission: Secondary | ICD-10-CM

## 2022-05-22 LAB — VISCOSITY, SERUM: Viscosity, Serum: 5.5 rel.saline — ABNORMAL HIGH (ref 1.4–2.1)

## 2022-05-22 NOTE — Telephone Encounter (Signed)
Oral Oncology Pharmacist Encounter   Received new prescription for Revlimid (lenalidomide) for the treatment of R/R multiple myeloma in conjunction with daratumumab, bortezomib, and dexamethasone, planned duration until disease progression or unacceptable drug toxicity.   CBC w/ Diff and CMP from 05/21/22 assessed, noted pt pltc of 92 K/uL. Prescription dose and frequency assessed for appropriateness.   Current medication list in Epic reviewed, no relevant/significant DDIs with Revlimid identified.   Evaluated chart and no patient barriers to medication adherence noted.    Once celgene auth number is obtained, prescription will need to be redirected to [facility] added to pharmacy preference list.    Oral Oncology Clinic will continue to follow for insurance authorization, copayment issues, initial counseling and start date.   Leron Croak, PharmD, BCPS Hematology/Oncology Clinical Pharmacist [facility] [phone] 05/21/2022 1:55 PM

## 2022-05-22 NOTE — Telephone Encounter (Signed)
Oral Oncology Pharmacist Encounter   Received new prescription for Revlimid (lenalidomide) for the treatment of R/R multiple myeloma in conjunction with daratumumab, bortezomib, and dexamethasone, planned duration until disease progression or unacceptable drug toxicity.   CBC w/ Diff and CMP from 05/21/22 assessed, noted pt pltc of 92 K/uL. Prescription dose and frequency assessed for appropriateness.   Current medication list in Epic reviewed, no relevant/significant DDIs with Revlimid identified.   Evaluated chart and no patient barriers to medication adherence noted.    Once celgene auth number is obtained, prescription will need to be redirected to [facility] added to pharmacy preference list.    Oral Oncology Clinic will continue to follow for insurance authorization, copayment issues, initial counseling and start date.  Leron Croak, PharmD, BCPS Hematology/Oncology Clinical Pharmacist [facility] [phone] 05/22/2022 10:37 AM

## 2022-05-22 NOTE — Telephone Encounter (Signed)
LM for patient via [employer] that this nurse was calling to see how they were doing after their treatment.yesterday. Please call back to Dr. Grier Mitts nurse at [phone] if they have any questions or concerns regarding the treatment.

## 2022-05-22 NOTE — Telephone Encounter (Signed)
Oral Oncology Patient Advocate Encounter  Prior Authorization for Revlimid has been approved.    PA# [id number] Effective dates: 05/21/2022 through 05/22/2023  [facility]   Lady Deutscher, CPhT-Adv Pharmacy Patient Advocate Specialist [facility] Patient Advocate Team Direct Number: [phone]  Fax: [phone]

## 2022-05-24 LAB — MULTIPLE MYELOMA PANEL, SERUM
Albumin/Glob SerPl: UNDETERMINED
Globulin, Total: UNDETERMINED g/dL
IgA: 12 mg/dL — ABNORMAL LOW (ref 90–386)
IgG (Immunoglobin G), Serum: 11456 mg/dL — ABNORMAL HIGH (ref 603–1613)
IgM (Immunoglobulin M), Srm: 6 mg/dL — ABNORMAL LOW (ref 20–172)

## 2022-05-26 LAB — VISCOSITY, SERUM: Viscosity, Serum: 3 rel.saline — ABNORMAL HIGH (ref 1.4–2.1)

## 2022-05-27 ENCOUNTER — Telehealth: Payer: Self-pay | Admitting: Hematology

## 2022-05-27 ENCOUNTER — Encounter: Payer: Self-pay | Admitting: Hematology

## 2022-05-27 LAB — MULTIPLE MYELOMA PANEL, SERUM
Albumin SerPl Elph-Mcnc: 3.5 g/dL (ref 2.9–4.4)
Albumin/Glob SerPl: 1 (ref 0.7–1.7)
Alpha 1: 0.1 g/dL (ref 0.0–0.4)
Alpha2 Glob SerPl Elph-Mcnc: 0.4 g/dL (ref 0.4–1.0)
B-Globulin SerPl Elph-Mcnc: 0.5 g/dL — ABNORMAL LOW (ref 0.7–1.3)
Gamma Glob SerPl Elph-Mcnc: 2.7 g/dL — ABNORMAL HIGH (ref 0.4–1.8)
Globulin, Total: 3.8 g/dL (ref 2.2–3.9)
IgA: 6 mg/dL — ABNORMAL LOW (ref 90–386)
IgG (Immunoglobin G), Serum: 3013 mg/dL — ABNORMAL HIGH (ref 603–1613)
IgM (Immunoglobulin M), Srm: <5 mg/dL — ABNORMAL LOW (ref 20–172)
M Protein SerPl Elph-Mcnc: 2.7 g/dL — ABNORMAL HIGH
Total Protein ELP: 7.3 g/dL (ref 6.0–8.5)

## 2022-05-27 NOTE — Telephone Encounter (Signed)
Scheduled per 5/30 in [place], pt contact person has been called and said they will let pt know about appts

## 2022-05-27 NOTE — Progress Notes (Signed)
Donald [facility]  HEMATOLOGY/ONCOLOGY I clinic visit NOTE  Date of Service: 05/20/2022  Inpatient Attending: .Brunetta Genera, MD   SUBJECTIVE  Patient was recently seen in the hospital for progressive multiple myeloma after having been lost to follow-up for nearly a year and a half.  He presented with symptoms of hyperviscosity and received plasmapheresis x3 with clearance of his headaches and confusion.  He also had symptomatic anemia requiring PRBC transfusions. He is here to start treatment of his high risk myeloma daratumumab Velcade and Revlimid. He notes no acute new symptoms since his last discharge. He had hyperglycemia and has been started on insulin.  Patient does not have a primary care physician.  He was recommended to set up a primary care physician and was also given a referral to endocrinology for management of his diabetes. 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  Was given a referral to ophthalmology. No nausea no vomiting no diarrhea.  No bleeding issues.  No lightheadedness or dizziness at this time. Lab results today were discussed in detail with the patient.  He is enthusiastic to follow-up with treatment. He was also given a card for our social worker to address his concerns about medical transportation and application for disability insurance.   OBJECTIVE:  NAD  PHYSICAL EXAMINATION: . Vitals:   05/20/22 1418  BP: 129/85  Pulse: 90  Resp: 20  Temp: 97.9 F (36.6 C)  SpO2: 100%  Weight: 161 lb 1.6 oz (73.1 kg)   Filed Weights   05/20/22 1418  Weight: 161 lb 1.6 oz (73.1 kg)   .Body mass index is 26 kg/m.  NAD GENERAL:alert, in no acute distress and comfortable SKIN: no acute rashes, no significant lesions EYES: conjunctiva are pink and non-injected, sclera anicteric OROPHARYNX: MMM, no exudates, no oropharyngeal erythema or ulceration NECK:  supple, no JVD LYMPH:  no palpable lymphadenopathy in the cervical, axillary or inguinal regions LUNGS: clear to auscultation b/l with normal respiratory effort HEART: regular rate & rhythm ABDOMEN:  normoactive bowel sounds , non tender, not distended. Extremity: no pedal edema PSYCH: alert & oriented x 3 with fluent speech NEURO: no focal motor/sensory deficits   MEDICAL HISTORY:  Past Medical History:  Diagnosis Date   Anemia     SURGICAL HISTORY: Past Surgical History:  Procedure Laterality Date   APPENDECTOMY     [place] FLUORO GUIDE CV LINE RIGHT  05/15/2022   [place] PATIENT EVAL TECH 0-60 MINS  05/19/2022   [place] US GUIDE VASC ACCESS RIGHT  05/15/2022    SOCIAL HISTORY: Social History   Socioeconomic History   Marital status: Married    Spouse name: Not on file   Number of children: Not on file   Years of education: Not on file   Highest education level: Not on file  Occupational History   Not on file  Tobacco Use   Smoking status: Never   Smokeless tobacco: Never  Vaping Use   Vaping Use: Never used  Substance and Sexual Activity   Alcohol use: Never   Drug use: Never   Sexual activity: Not on file  Other Topics Concern   Not on file  Social History Narrative   Not on file   Social Determinants of Health   Financial Resource Strain: Medium Risk   Difficulty of Paying Living Expenses: Somewhat hard  Food Insecurity: Food Insecurity Present   Worried About Charity fundraiser in  the Last Year: Sometimes true   Ran Out of Food in the Last Year: Sometimes true  Transportation Needs: No Transportation Needs   Lack of Transportation (Medical): No   Lack of Transportation (Non-Medical): No  Physical Activity: Not on file  Stress: Not on file  Social Connections: Not on file  Intimate Partner Violence: Not on file    FAMILY HISTORY: No family history on file.  ALLERGIES:  has No Known Allergies.  MEDICATIONS:  Reviewed REVIEW OF SYSTEMS:    10 Point review  of Systems was done is negative except as noted above.   LABORATORY DATA:  I have reviewed the data as listed  .    Latest Ref Rng & Units 05/21/2022    7:52 AM 05/20/2022    6:11 AM 05/19/2022    3:09 AM  CBC  WBC 4.0 - 10.5 K/uL 7.9   8.7   7.7    Hemoglobin 13.0 - 17.0 g/dL 9.7   11.0   10.7    Hematocrit 39.0 - 52.0 % 29.2   33.7   32.2    Platelets 150 - 400 K/uL 92   60   61      .    Latest Ref Rng & Units 05/21/2022    7:52 AM 05/20/2022    6:11 AM 05/19/2022    3:09 AM  CMP  Glucose 70 - 99 mg/dL 262   128   113    BUN 6 - 20 mg/dL 15   16   23     Creatinine 0.61 - 1.24 mg/dL 0.86   0.72   0.68    Sodium 135 - 145 mmol/L 129   131   132    Potassium 3.5 - 5.1 mmol/L 3.6   3.3   3.3    Chloride 98 - 111 mmol/L 100   104   103    CO2 22 - 32 mmol/L 25   22   24     Calcium 8.9 - 10.3 mg/dL 7.7   8.2   8.1    Total Protein 6.5 - 8.1 g/dL 7.7      Total Bilirubin 0.3 - 1.2 mg/dL 0.5      Alkaline Phos 38 - 126 U/L 39      AST 15 - 41 U/L 23      ALT 0 - 44 U/L 36         RADIOGRAPHIC STUDIES: I have personally reviewed the radiological images as listed and agreed with the findings in the report. [place] Fluoro Guide CV Line Right  Result Date: 05/15/2022 INDICATION: 56 year old male referred for temporary hemodialysis catheter for plasmapheresis purpose EXAM: IMAGE GUIDED TEMPORARY HEMODIALYSIS CATHETER MEDICATIONS: None ANESTHESIA/SEDATION: None FLUOROSCOPY: Radiation Exposure Index (as provided by the fluoroscopic device): 1 mGy Kerma COMPLICATIONS: None PROCEDURE: Informed written consent was obtained from the patient's family after a discussion of the risks, benefits, and alternatives to treatment. Questions regarding the procedure were encouraged and answered. The right neck was prepped with chlorhexidine in a sterile fashion, and a sterile drape was applied covering the operative field. Maximum barrier sterile technique with sterile gowns and gloves were used for the  procedure. A timeout was performed prior to the initiation of the procedure. A micropuncture kit was utilized to access the right internal jugular vein under direct, real-time ultrasound guidance after the overlying soft tissues were anesthetized with 1% lidocaine with epinephrine. Ultrasound image documentation was performed. The microwire was kinked to measure appropriate  catheter length. A stiff glidewire was advanced to the level of the IVC. A 16 cm hemodialysis catheter was then placed over the wire. Final catheter positioning was confirmed and documented with a spot radiographic image. The catheter aspirates and flushes normally. The catheter was flushed with appropriate volume heparin dwells. Dressings were applied. The patient tolerated the procedure well without immediate post procedural complication. IMPRESSION: Status post right IJ temporary hemodialysis catheter. Signed, Dulcy Fanny. Dellia Nims, RPVI [facility] Radiology Electronically Signed   By: Corrie Mckusick D.O.   On: 05/15/2022 09:57   [place] US Guide Vasc Access Right  Result Date: 05/15/2022 INDICATION: 56 year old male referred for temporary hemodialysis catheter for plasmapheresis purpose EXAM: IMAGE GUIDED TEMPORARY HEMODIALYSIS CATHETER MEDICATIONS: None ANESTHESIA/SEDATION: None FLUOROSCOPY: Radiation Exposure Index (as provided by the fluoroscopic device): 1 mGy Kerma COMPLICATIONS: None PROCEDURE: Informed written consent was obtained from the patient's family after a discussion of the risks, benefits, and alternatives to treatment. Questions regarding the procedure were encouraged and answered. The right neck was prepped with chlorhexidine in a sterile fashion, and a sterile drape was applied covering the operative field. Maximum barrier sterile technique with sterile gowns and gloves were used for the procedure. A timeout was performed prior to the initiation of the procedure. A micropuncture  kit was utilized to access the right internal jugular vein under direct, real-time ultrasound guidance after the overlying soft tissues were anesthetized with 1% lidocaine with epinephrine. Ultrasound image documentation was performed. The microwire was kinked to measure appropriate catheter length. A stiff glidewire was advanced to the level of the IVC. A 16 cm hemodialysis catheter was then placed over the wire. Final catheter positioning was confirmed and documented with a spot radiographic image. The catheter aspirates and flushes normally. The catheter was flushed with appropriate volume heparin dwells. Dressings were applied. The patient tolerated the procedure well without immediate post procedural complication. IMPRESSION: Status post right IJ temporary hemodialysis catheter. Signed, Dulcy Fanny. Dellia Nims, RPVI [facility] Radiology Electronically Signed   By: Corrie Mckusick D.O.   On: 05/15/2022 09:57   DG Chest Port 1 View  Result Date: 05/13/2022 CLINICAL DATA:  Weakness EXAM: PORTABLE CHEST 1 VIEW COMPARISON:  None Available. FINDINGS: The heart size and mediastinal contours are within normal limits. Slightly low lung volumes. No focal airspace consolidation, pleural effusion, or pneumothorax. The visualized skeletal structures are unremarkable. IMPRESSION: No active disease. Electronically Signed   By: Davina Poke D.O.   On: 05/13/2022 13:03   DG Bone Survey Met  Result Date: 05/14/2022 CLINICAL DATA:  Multiple myeloma. EXAM: METASTATIC BONE SURVEY COMPARISON:  None Available. FINDINGS: Multiple small rounded lucencies are noted in the skull, the largest noted in the frontal region. Small rounded lucency is noted in proximal right humeral shaft. Small rounded lucency is noted in the proximal right femoral shaft. No other abnormality is noted in the skeleton. IMPRESSION: Small rounded lucencies are noted in the skull, proximal right humerus and  proximal right femur concerning for multiple myeloma. Electronically Signed   By: Marijo Conception M.D.   On: 05/14/2022 15:34   [place] PATIENT EVAL TECH 0-60 MINS  Result Date: 05/19/2022 Tonye Pearson, RT     05/19/2022  2:31 PM Temp Cath was removed and bandaged placed after hemostasis achieved. Melanie Hill RT(R)    ASSESSMENT & PLAN:   1) Progressive IgG Lambda Multiple myeloma not on treatment for more than 1 year  due to patient's lack of follow-up. Patient diagnosed December 2021 and received 1 cycle of CyBorD. Had previously presented with anemia renal insufficiency and hyperviscosity symptoms. Bx- 90% plasma cells Initial M spike 8.03g/dl Del(1p): Not Detected  Dup(1q): Not Detected  Gains(15): DETECTED  Gains(5 and 9): Not Detected  Del(13q)/-13: DETECTED  Del(17p)(TP53): Not Detected  IGH(Rearrangement): SEE BELOW    IgH complex: t(4;14): DETECTED  t(11;14): Not Detected  t(14;16): Not Detected  t(14;20): Not Detected   Cytogenetics: Normal male karyotype.   -Labs from 05/13/2022-beta-2 microglobulin 4.4, LDH 197, lambda free light chain 77.1, kappa, lambda light chain ratio 0.08, multiple myeloma panel pending.  UPEP ordered but not yet collected. 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  Due to multiple myeloma  PLAN: --- Labs done today were discussed in detail with the patient -He is appropriate to proceed with daratumumab Velcade and Revlimid treatment as per plans for his high risk  myeloma -He was recommended to establish a primary care physician for management of other medical issues -Given referral to endocrinology for management of hyperglycemia/diabetes -Given referral to ophthalmology for evaluation of his blurred vision -We will plan to start him on Zometa with his second cycle of treatment -Outpatient PET CT scan pending -We shall continue to follow Goals of care discussed in detail with the patient. Patient wants to hold off on Port-A-Cath placement at this time endocrinology referral ophthalmology referral as scheduled  The total time spent in the appointment was 40 minutes*.  All of the patient's questions were answered with apparent satisfaction. The patient knows to call the clinic with any problems, questions or concerns.   Sullivan Lone MD MS AAHIVMS [facility] Hematology/Oncology Physician [facility]  .*Total Encounter Time as defined by the Centers for Medicare and Medicaid Services includes, in addition to the face-to-face time of a patient visit (documented in the note above) non-face-to-face time: obtaining and reviewing outside history, ordering and reviewing medications, tests or procedures, care coordination (communications with other health care professionals or caregivers) and documentation in the medical record.

## 2022-05-28 ENCOUNTER — Other Ambulatory Visit: Payer: Self-pay

## 2022-05-28 ENCOUNTER — Ambulatory Visit (HOSPITAL_COMMUNITY)
Admission: RE | Admit: 2022-05-28 | Discharge: 2022-05-28 | Disposition: A | Discharge status: Home / Self Care | Payer: BC Managed Care – PPO | Source: Ambulatory Visit | Attending: Hematology | Admitting: Hematology

## 2022-05-28 DIAGNOSIS — C9000 Multiple myeloma not having achieved remission: Secondary | ICD-10-CM | POA: Diagnosis not present

## 2022-05-28 LAB — GLUCOSE, CAPILLARY: Glucose-Capillary: 161 mg/dL — ABNORMAL HIGH (ref 70–99)

## 2022-05-28 MED ORDER — FLUDEOXYGLUCOSE F - 18 (FDG) INJECTION
8.0600 | Freq: Once | INTRAVENOUS | Status: AC
  Administered 2022-05-28: 8.06 via INTRAVENOUS

## 2022-05-28 MED ORDER — LENALIDOMIDE 15 MG PO CAPS: 15.0000 mg | ORAL_CAPSULE | Freq: Every day | ORAL | 2 refills | Status: DC

## 2022-05-29 ENCOUNTER — Other Ambulatory Visit (HOSPITAL_COMMUNITY): Payer: Self-pay

## 2022-05-30 ENCOUNTER — Encounter: Payer: Self-pay | Admitting: Licensed Clinical Social Worker

## 2022-05-30 NOTE — Progress Notes (Signed)
[place] CSW Progress Note  Clinical Education officer, museum applied for patient aid program grant through [employer] on behalf of patient. Pt approved and will receive stipend in next 7-14 days.    Alanmichael Barmore E Lawsyn Heiler, LCSW

## 2022-06-02 ENCOUNTER — Other Ambulatory Visit: Payer: Self-pay

## 2022-06-02 DIAGNOSIS — C9000 Multiple myeloma not having achieved remission: Secondary | ICD-10-CM

## 2022-06-03 ENCOUNTER — Inpatient Hospital Stay: Payer: BC Managed Care – PPO

## 2022-06-03 ENCOUNTER — Inpatient Hospital Stay: Payer: BC Managed Care – PPO | Attending: Hematology

## 2022-06-03 DIAGNOSIS — D6959 Other secondary thrombocytopenia: Secondary | ICD-10-CM | POA: Insufficient documentation

## 2022-06-03 DIAGNOSIS — Z5112 Encounter for antineoplastic immunotherapy: Secondary | ICD-10-CM | POA: Insufficient documentation

## 2022-06-03 DIAGNOSIS — N289 Disorder of kidney and ureter, unspecified: Secondary | ICD-10-CM | POA: Insufficient documentation

## 2022-06-03 DIAGNOSIS — C9000 Multiple myeloma not having achieved remission: Secondary | ICD-10-CM | POA: Insufficient documentation

## 2022-06-03 DIAGNOSIS — D649 Anemia, unspecified: Secondary | ICD-10-CM | POA: Insufficient documentation

## 2022-06-03 DIAGNOSIS — Z5111 Encounter for antineoplastic chemotherapy: Secondary | ICD-10-CM | POA: Insufficient documentation

## 2022-06-06 ENCOUNTER — Inpatient Hospital Stay: Payer: BC Managed Care – PPO

## 2022-06-06 ENCOUNTER — Other Ambulatory Visit (HOSPITAL_COMMUNITY): Payer: Self-pay

## 2022-06-06 ENCOUNTER — Other Ambulatory Visit: Payer: Self-pay

## 2022-06-06 ENCOUNTER — Inpatient Hospital Stay: Payer: BC Managed Care – PPO | Admitting: Hematology

## 2022-06-06 VITALS — BP 114/65 | HR 74 | Temp 98.5°F | Resp 18

## 2022-06-06 VITALS — BP 126/77 | HR 88 | Temp 97.7°F | Resp 16 | Wt 164.7 lb

## 2022-06-06 DIAGNOSIS — Z5112 Encounter for antineoplastic immunotherapy: Secondary | ICD-10-CM | POA: Diagnosis not present

## 2022-06-06 DIAGNOSIS — Z5111 Encounter for antineoplastic chemotherapy: Secondary | ICD-10-CM | POA: Diagnosis present

## 2022-06-06 DIAGNOSIS — C9000 Multiple myeloma not having achieved remission: Secondary | ICD-10-CM | POA: Diagnosis not present

## 2022-06-06 DIAGNOSIS — D6959 Other secondary thrombocytopenia: Secondary | ICD-10-CM | POA: Diagnosis not present

## 2022-06-06 DIAGNOSIS — D649 Anemia, unspecified: Secondary | ICD-10-CM | POA: Diagnosis not present

## 2022-06-06 DIAGNOSIS — Z7189 Other specified counseling: Secondary | ICD-10-CM

## 2022-06-06 DIAGNOSIS — N289 Disorder of kidney and ureter, unspecified: Secondary | ICD-10-CM | POA: Diagnosis not present

## 2022-06-06 LAB — CMP (CANCER CENTER ONLY)
ALT: 26 U/L (ref 0–44)
AST: 15 U/L (ref 15–41)
Albumin: 3.2 g/dL — ABNORMAL LOW (ref 3.5–5.0)
Alkaline Phosphatase: 290 U/L — ABNORMAL HIGH (ref 38–126)
Anion gap: 7 (ref 5–15)
BUN: 10 mg/dL (ref 6–20)
CO2: 24 mmol/L (ref 22–32)
Calcium: 8.3 mg/dL — ABNORMAL LOW (ref 8.9–10.3)
Chloride: 105 mmol/L (ref 98–111)
Creatinine: 0.7 mg/dL (ref 0.61–1.24)
GFR, Estimated: >60 mL/min (ref >60)
Glucose, Bld: 234 mg/dL — ABNORMAL HIGH (ref 70–99)
Potassium: 3.5 mmol/L (ref 3.5–5.1)
Sodium: 136 mmol/L (ref 135–145)
Total Bilirubin: 0.4 mg/dL (ref 0.3–1.2)
Total Protein: 7.9 g/dL (ref 6.5–8.1)

## 2022-06-06 LAB — CBC WITH DIFFERENTIAL (CANCER CENTER ONLY)
Abs Immature Granulocytes: 0.01 10*3/uL (ref 0.00–0.07)
Basophils Absolute: 0 10*3/uL (ref 0.0–0.1)
Basophils Relative: 0 %
Eosinophils Absolute: 0.1 10*3/uL (ref 0.0–0.5)
Eosinophils Relative: 1 %
HCT: 31.1 % — ABNORMAL LOW (ref 39.0–52.0)
Hemoglobin: 10.3 g/dL — ABNORMAL LOW (ref 13.0–17.0)
Immature Granulocytes: 0 %
Lymphocytes Relative: 43 %
Lymphs Abs: 3.1 10*3/uL (ref 0.7–4.0)
MCH: 28.8 pg (ref 26.0–34.0)
MCHC: 33.1 g/dL (ref 30.0–36.0)
MCV: 86.9 fL (ref 80.0–100.0)
Monocytes Absolute: 0.7 10*3/uL (ref 0.1–1.0)
Monocytes Relative: 10 %
Neutro Abs: 3.3 10*3/uL (ref 1.7–7.7)
Neutrophils Relative %: 46 %
Platelet Count: 250 10*3/uL (ref 150–400)
RBC: 3.58 MIL/uL — ABNORMAL LOW (ref 4.22–5.81)
RDW: 18.1 % — ABNORMAL HIGH (ref 11.5–15.5)
WBC Count: 7.2 10*3/uL (ref 4.0–10.5)
nRBC: 0 % (ref 0.0–0.2)

## 2022-06-06 LAB — LACTATE DEHYDROGENASE: LDH: 171 U/L (ref 98–192)

## 2022-06-06 MED ORDER — ACETAMINOPHEN 325 MG PO TABS: 650.0000 mg | ORAL_TABLET | Freq: Once | ORAL | Status: DC

## 2022-06-06 MED ORDER — SODIUM CHLORIDE 0.9 % IV SOLN
16.0000 mg/kg | Freq: Once | INTRAVENOUS | Status: AC
  Administered 2022-06-06: 1300 mg via INTRAVENOUS
  Filled 2022-06-06: qty 60

## 2022-06-06 MED ORDER — BORTEZOMIB CHEMO SQ INJECTION 3.5 MG (2.5MG/ML)
1.3000 mg/m2 | Freq: Once | INTRAMUSCULAR | Status: AC
  Administered 2022-06-06: 2.5 mg via SUBCUTANEOUS
  Filled 2022-06-06: qty 1

## 2022-06-06 MED ORDER — SODIUM CHLORIDE 0.9 % IV SOLN: Freq: Once | INTRAVENOUS | Status: AC

## 2022-06-06 MED ORDER — MONTELUKAST SODIUM 10 MG PO TABS
10.0000 mg | ORAL_TABLET | Freq: Once | ORAL | Status: AC
  Administered 2022-06-06: 10 mg via ORAL
  Filled 2022-06-06: qty 1

## 2022-06-06 MED ORDER — ACETAMINOPHEN 500 MG PO TABS
1000.0000 mg | ORAL_TABLET | Freq: Once | ORAL | Status: AC
  Administered 2022-06-06: 1000 mg via ORAL
  Filled 2022-06-06: qty 2

## 2022-06-06 MED ORDER — ACETAMINOPHEN 500 MG PO TABS: 1000.0000 mg | ORAL_TABLET | Freq: Once | ORAL | Status: DC

## 2022-06-06 MED ORDER — SODIUM CHLORIDE 0.9 % IV SOLN
20.0000 mg | Freq: Once | INTRAVENOUS | Status: AC
  Administered 2022-06-06: 20 mg via INTRAVENOUS
  Filled 2022-06-06: qty 20

## 2022-06-06 MED ORDER — DIPHENHYDRAMINE HCL 25 MG PO CAPS
50.0000 mg | ORAL_CAPSULE | Freq: Once | ORAL | Status: AC
  Administered 2022-06-06: 50 mg via ORAL
  Filled 2022-06-06: qty 2

## 2022-06-06 NOTE — Progress Notes (Signed)
Discontine APAP '650mg'$ . Continue APAP '1000mg'$ .  Raul Del Shaw, [place], BCPS, BCOP 06/06/2022 10:31 AM

## 2022-06-06 NOTE — Patient Instructions (Addendum)
[facility] MEDICAL ONCOLOGY  Discharge Instructions: Thank you for choosing [facility] to provide your oncology and hematology care.   If you have a lab appointment with the Cancer Center, please go directly to the Cancer Center and check in at the registration area.   Wear comfortable clothing and clothing appropriate for easy access to any Portacath or PICC line.   We strive to give you quality time with your provider. You may need to reschedule your appointment if you arrive late (15 or more minutes).  Arriving late affects you and other patients whose appointments are after yours.  Also, if you miss three or more appointments without notifying the office, you may be dismissed from the clinic at the provider's discretion.      For prescription refill requests, have your pharmacy contact our office and allow 72 hours for refills to be completed.    Today you received the following chemotherapy and/or immunotherapy agents Velcade and Darzalex      To help prevent nausea and vomiting after your treatment, we encourage you to take your nausea medication as directed.  BELOW ARE SYMPTOMS THAT SHOULD BE REPORTED IMMEDIATELY: *FEVER GREATER THAN 100.4 F (38 C) OR HIGHER *CHILLS OR SWEATING *NAUSEA AND VOMITING THAT IS NOT CONTROLLED WITH YOUR NAUSEA MEDICATION *UNUSUAL SHORTNESS OF BREATH *UNUSUAL BRUISING OR BLEEDING *URINARY PROBLEMS (pain or burning when urinating, or frequent urination) *BOWEL PROBLEMS (unusual diarrhea, constipation, pain near the anus) TENDERNESS IN MOUTH AND THROAT WITH OR WITHOUT PRESENCE OF ULCERS (sore throat, sores in mouth, or a toothache) UNUSUAL RASH, SWELLING OR PAIN  UNUSUAL VAGINAL DISCHARGE OR ITCHING   Items with * indicate a potential emergency and should be followed up as soon as possible or go to the Emergency Department if any problems should occur.  Please show the CHEMOTHERAPY ALERT CARD or IMMUNOTHERAPY ALERT CARD at  check-in to the Emergency Department and triage nurse.  Should you have questions after your visit or need to cancel or reschedule your appointment, please contact [facility] MEDICAL ONCOLOGY  Dept: [phone]  and follow the prompts.  Office hours are 8:00 a.m. to 4:30 p.m. Monday - Friday. Please note that voicemails left after 4:00 p.m. may not be returned until the following business day.  We are closed weekends and major holidays. You have access to a nurse at all times for urgent questions. Please call the main number to the clinic Dept: [phone] and follow the prompts.   For any non-urgent questions, you may also contact your provider using MyChart. We now offer e-Visits for anyone 18 and older to request care online for non-urgent symptoms. For details visit [email or web address].   Also download the MyChart app! Go to the app store, search "MyChart", open the app, select [facility], and log in with your MyChart username and password.  Due to Covid, a mask is required upon entering the hospital/clinic. If you do not have a mask, one will be given to you upon arrival. For doctor visits, patients may have 1 support person aged 18 or older with them. For treatment visits, patients cannot have anyone with them due to current Covid guidelines and our immunocompromised population.  

## 2022-06-06 NOTE — Progress Notes (Signed)
[place] CSW Progress Note  Clinical Education officer, museum met with patient to address needs, goals of care and advanced care planning per Dr. Grier Mitts request.  Patient was receiving infusion. Garnet Koyanagi, CSW, was also present since she had provided resources for patient previously.  His disability application through [facility] is still pending.  He appeared tired and was minimally responsive.  He said he would benefit from food and CSW provided a supply.  Plan is for CSW to follow up with patient during his next appointment on 6/13 to discuss his goals and care planning.    Rodman Pickle Khoury Siemon, LCSW

## 2022-06-06 NOTE — Progress Notes (Signed)
[name]  HEMATOLOGY/ONCOLOGY I clinic visit NOTE  Date of Service: 06/06/2022  Chief complaint: Follow-up for continued valuation and management of high risk multiple myeloma  Current treatment-daratumumab/Revlimid/Velcade/dexamethasone  Interval history  Patient is here for continued evaluation and management of his high-dose multiple myeloma and for toxicity check after his first dose of daratumumab/Velcade/dexamethasone.  He has also started Revlimid at 15 mg p.o. daily. He missed his cycle 1 day 4 of Velcade. He notes his headaches have resolved.  Visual blurring has significantly improved. He has not yet set up a primary care physician or endocrinology appointment. Notes no other toxicities from his treatment at this time Labs done today were reviewed with him in detail To adjust for his missed treatment visual changes first cycle of treatment to weekly Velcade Dara dexamethasone. He was counseled on importance of maintaining appropriate treatment follow-up.  OBJECTIVE:  NAD  PHYSICAL EXAMINATION: . Vitals:   06/06/22 0858  BP: 126/77  Pulse: 88  Resp: 16  Temp: 97.7 F (36.5 C)  TempSrc: Temporal  SpO2: 98%  Weight: 164 lb 11.2 oz (74.7 kg)   Filed Weights   06/06/22 0858  Weight: 164 lb 11.2 oz (74.7 kg)   .Body mass index is 26.58 kg/m. NAD GENERAL:alert, in no acute distress and comfortable SKIN: no acute rashes, no significant lesions EYES: conjunctiva are pink and non-injected, sclera anicteric OROPHARYNX: MMM, no exudates, no oropharyngeal erythema or ulceration NECK: supple, no JVD LYMPH:  no palpable lymphadenopathy in the cervical, axillary or inguinal regions LUNGS: clear to auscultation b/l with normal respiratory effort HEART: regular rate & rhythm ABDOMEN:  normoactive bowel sounds , non tender, not distended. Extremity: no pedal edema PSYCH: alert & oriented x 3 with fluent speech NEURO: no focal motor/sensory deficits   MEDICAL HISTORY:  Past  Medical History:  Diagnosis Date   Anemia     SURGICAL HISTORY: Past Surgical History:  Procedure Laterality Date   APPENDECTOMY     Shalev FLUORO GUIDE CV LINE RIGHT  05/15/2022   Adhrit PATIENT EVAL TECH 0-60 MINS  05/19/2022   Kilan US GUIDE VASC ACCESS RIGHT  05/15/2022    SOCIAL HISTORY: Social History   Socioeconomic History   Marital status: Married    Spouse name: Not on file   Number of children: Not on file   Years of education: Not on file   Highest education level: Not on file  Occupational History   Not on file  Tobacco Use   Smoking status: Never   Smokeless tobacco: Never  Vaping Use   Vaping Use: Never used  Substance and Sexual Activity   Alcohol use: Never   Drug use: Never   Sexual activity: Not on file  Other Topics Concern   Not on file  Social History Narrative   Not on file   Social Determinants of Health   Financial Resource Strain: Medium Risk (05/21/2022)   Overall Financial Resource Strain (CARDIA)    Difficulty of Paying Living Expenses: Somewhat hard  Food Insecurity: Food Insecurity Present (05/21/2022)   Hunger Vital Sign    Worried About Running Out of Food in the Last Year: Sometimes true    Ran Out of Food in the Last Year: Sometimes true  Transportation Needs: No Transportation Needs (05/21/2022)   PRAPARE - Hydrologist (Medical): No    Lack of Transportation (Non-Medical): No  Physical Activity: Not on file  Stress: Not on file  Social Connections: Not on file  Intimate Partner Violence: Not on file    FAMILY HISTORY: No family history on file.  ALLERGIES:  has No Known Allergies.  MEDICATIONS:  Reviewed REVIEW OF SYSTEMS:    10 Point review of Systems was done is negative except as noted above.  LABORATORY DATA:  I have reviewed the data as listed  .    Latest Ref Rng & Units 06/06/2022    8:08 AM 05/21/2022    7:52 AM 05/20/2022    6:11 AM  CBC  WBC 4.0 - 10.5 K/uL 7.2  7.9  8.7   Hemoglobin  13.0 - 17.0 g/dL 10.3  9.7  11.0   Hematocrit 39.0 - 52.0 % 31.1  29.2  33.7   Platelets 150 - 400 K/uL 250  92  60     .    Latest Ref Rng & Units 06/06/2022    8:08 AM 05/21/2022    7:52 AM 05/20/2022    6:11 AM  CMP  Glucose 70 - 99 mg/dL 234  262  128   BUN 6 - 20 mg/dL 10  15  16    Creatinine 0.61 - 1.24 mg/dL 0.70  0.86  0.72   Sodium 135 - 145 mmol/L 136  129  131   Potassium 3.5 - 5.1 mmol/L 3.5  3.6  3.3   Chloride 98 - 111 mmol/L 105  100  104   CO2 22 - 32 mmol/L 24  25  22    Calcium 8.9 - 10.3 mg/dL 8.3  7.7  8.2   Total Protein 6.5 - 8.1 g/dL 7.9  7.7    Total Bilirubin 0.3 - 1.2 mg/dL 0.4  0.5    Alkaline Phos 38 - 126 U/L 290  39    AST 15 - 41 U/L 15  23    ALT 0 - 44 U/L 26  36       RADIOGRAPHIC STUDIES: I have personally reviewed the radiological images as listed and agreed with the findings in the report. NM PET Image Restage (PS) Whole Body  Result Date: 05/28/2022 CLINICAL DATA:  Initial treatment strategy for high-risk myeloma. EXAM: NUCLEAR MEDICINE PET WHOLE BODY TECHNIQUE: 8.06 mCi F-18 FDG was injected intravenously. Full-ring PET imaging was performed from the head to foot after the radiotracer. CT data was obtained and used for attenuation correction and anatomic localization. Fasting blood glucose: 161 mg/dl COMPARISON:  Bone survey of May 14, 2022. FINDINGS: Mediastinal blood pool activity: SUV max 1.86 HEAD/NECK: No hypermetabolic activity in the scalp. No hypermetabolic cervical lymph nodes. Incidental CT findings: none CHEST: No hypermetabolic mediastinal or hilar nodes. No suspicious pulmonary nodules on the CT scan. Incidental CT findings: Aortic atherosclerosis without aneurysmal dilation of the thoracic aorta. The top normal heart size without pericardial effusion. Normal caliber central pulmonary vessels. Limited assessment of cardiovascular structures given lack of intravenous contrast. ABDOMEN/PELVIS: No abnormal hypermetabolic activity within the  liver, pancreas, adrenal glands, or spleen. No hypermetabolic lymph nodes in the abdomen or pelvis. Incidental CT findings: Cholelithiasis. No acute findings relative to liver or gallbladder. Pancreas without signs of inflammation. Spleen, adrenal glands, kidneys, stomach, small and large bowel without acute process. SKELETON: Diffuse lytic changes throughout the visualized skeleton. Diffuse marrow uptake of FDG. Lytic LEFT clavicular lesion of the distal LEFT clavicle likely associated with nondisplaced pathologic fracture measuring 2.9 cm with a maximum SUV of 3.13. Expansile lytic focus in the RIGHT posterior eleventh rib (image 133/4) maximum SUV 2.98. Marrow space uptake in the bilateral  proximal humeri with scattered lytic foci throughout the humeri. Heterogeneous lytic changes of the diffuse nature throughout the spine, ribs and pelvis. Larger areas of lytic change in the pelvis and sacrum (image 189/4) 2.2 cm area of lytic change shows a maximum SUV of 2.91. Similar size RIGHT posterior iliac lytic area with a maximum SUV of 3.12. RIGHT acetabular lesion (image 211/4) 16 mm with a maximum SUV of 1.81 scattered lytic foci throughout the pelvis of varying size. L4 as an example of marrow space activity in the spine with a maximum SUV of 3.25. Incidental CT findings: None EXTREMITIES: Relative sparing of the lower extremities below the proximal femoral level. Proximal femora with small lytic foci and generalized marrow uptake similar to that seen in the pelvis and spine. Relative sparing also bilateral upper extremity, signs of mild marrow uptake in the proximal humeri. Incidental CT findings: none IMPRESSION: Diffuse marrow uptake in the setting of diffuse lytic changes suspicious for widespread involvement from multiple myeloma. Mild increased metabolic activity associated with expansile lytic areas as outlined above. No signs of nodal or focal solid organ uptake. Electronically Signed   By: Zetta Bills  M.D.   On: 05/28/2022 22:35   [place] PATIENT EVAL TECH 0-60 MINS  Result Date: 05/19/2022 Tonye Pearson, RT     05/19/2022  2:31 PM Temp Cath was removed and bandaged placed after hemostasis achieved. Melanie Hill RT(R)   Jamont Fluoro Guide CV Line Right  Result Date: 05/15/2022 INDICATION: 56 year old male referred for temporary hemodialysis catheter for plasmapheresis purpose EXAM: IMAGE GUIDED TEMPORARY HEMODIALYSIS CATHETER MEDICATIONS: None ANESTHESIA/SEDATION: None FLUOROSCOPY: Radiation Exposure Index (as provided by the fluoroscopic device): 1 mGy Kerma COMPLICATIONS: None PROCEDURE: Informed written consent was obtained from the patient's family after a discussion of the risks, benefits, and alternatives to treatment. Questions regarding the procedure were encouraged and answered. The right neck was prepped with chlorhexidine in a sterile fashion, and a sterile drape was applied covering the operative field. Maximum barrier sterile technique with sterile gowns and gloves were used for the procedure. A timeout was performed prior to the initiation of the procedure. A micropuncture kit was utilized to access the right internal jugular vein under direct, real-time ultrasound guidance after the overlying soft tissues were anesthetized with 1% lidocaine with epinephrine. Ultrasound image documentation was performed. The microwire was kinked to measure appropriate catheter length. A stiff glidewire was advanced to the level of the IVC. A 16 cm hemodialysis catheter was then placed over the wire. Final catheter positioning was confirmed and documented with a spot radiographic image. The catheter aspirates and flushes normally. The catheter was flushed with appropriate volume heparin dwells. Dressings were applied. The patient tolerated the procedure well without immediate post procedural complication. IMPRESSION: Status post right IJ temporary hemodialysis catheter. Signed, Dulcy Fanny. Dellia Nims, RPVI  [facility] Radiology Electronically Signed   By: Corrie Mckusick D.O.   On: 05/15/2022 09:57   Kiel US Guide Vasc Access Right  Result Date: 05/15/2022 INDICATION: 56 year old male referred for temporary hemodialysis catheter for plasmapheresis purpose EXAM: IMAGE GUIDED TEMPORARY HEMODIALYSIS CATHETER MEDICATIONS: None ANESTHESIA/SEDATION: None FLUOROSCOPY: Radiation Exposure Index (as provided by the fluoroscopic device): 1 mGy Kerma COMPLICATIONS: None PROCEDURE: Informed written consent was obtained from the patient's family after a discussion of the risks, benefits, and alternatives to treatment. Questions regarding the procedure were encouraged and answered. The right neck was prepped with chlorhexidine in a sterile fashion, and a sterile drape  was applied covering the operative field. Maximum barrier sterile technique with sterile gowns and gloves were used for the procedure. A timeout was performed prior to the initiation of the procedure. A micropuncture kit was utilized to access the right internal jugular vein under direct, real-time ultrasound guidance after the overlying soft tissues were anesthetized with 1% lidocaine with epinephrine. Ultrasound image documentation was performed. The microwire was kinked to measure appropriate catheter length. A stiff glidewire was advanced to the level of the IVC. A 16 cm hemodialysis catheter was then placed over the wire. Final catheter positioning was confirmed and documented with a spot radiographic image. The catheter aspirates and flushes normally. The catheter was flushed with appropriate volume heparin dwells. Dressings were applied. The patient tolerated the procedure well without immediate post procedural complication. IMPRESSION: Status post right IJ temporary hemodialysis catheter. Signed, Dulcy Fanny. Dellia Nims, RPVI [facility] Special[facility] Radiology Electronically Signed    By: Corrie Mckusick D.O.   On: 05/15/2022 09:57   DG Bone Survey Met  Result Date: 05/14/2022 CLINICAL DATA:  Multiple myeloma. EXAM: METASTATIC BONE SURVEY COMPARISON:  None Available. FINDINGS: Multiple small rounded lucencies are noted in the skull, the largest noted in the frontal region. Small rounded lucency is noted in proximal right humeral shaft. Small rounded lucency is noted in the proximal right femoral shaft. No other abnormality is noted in the skeleton. IMPRESSION: Small rounded lucencies are noted in the skull, proximal right humerus and proximal right femur concerning for multiple myeloma. Electronically Signed   By: Marijo Conception M.D.   On: 05/14/2022 15:34   DG Chest Port 1 View  Result Date: 05/13/2022 CLINICAL DATA:  Weakness EXAM: PORTABLE CHEST 1 VIEW COMPARISON:  None Available. FINDINGS: The heart size and mediastinal contours are within normal limits. Slightly low lung volumes. No focal airspace consolidation, pleural effusion, or pneumothorax. The visualized skeletal structures are unremarkable. IMPRESSION: No active disease. Electronically Signed   By: Davina Poke D.O.   On: 05/13/2022 13:03    ASSESSMENT & PLAN:   1) Progressive IgG Lambda Multiple myeloma not on treatment for more than 1 year due to patient's lack of follow-up. Patient diagnosed December 2021 and received 1 cycle of CyBorD. Had previously presented with anemia renal insufficiency and hyperviscosity symptoms. Bx- 90% plasma cells Initial M spike 8.03g/dl Del(1p): Not Detected  Dup(1q): Not Detected  Gains(15): DETECTED  Gains(5 and 9): Not Detected  Del(13q)/-13: DETECTED  Del(17p)(TP53): Not Detected  IGH(Rearrangement): SEE BELOW   IgH complex: t(4;14): DETECTED  t(11;14): Not Detected  t(14;16): Not Detected  t(14;20): Not Detected   Cytogenetics: Normal male karyotype.   -Labs from 05/13/2022-beta-2 microglobulin 4.4, LDH 197, lambda free light chain 77.1, kappa, lambda light  chain ratio 0.08, multiple myeloma panel pending.  UPEP ordered but not yet collected. 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  Due to multiple myeloma  PLAN: --- Patient's labs from today were discussed in details. -She has no acute new treatment related toxicities at this time. -Patient is appropriate to continue with his daratumumab Velcade  Revlimid treatment as per plan for his high risk myeloma. -He was recommended to maintain complete compliance with treatment and not to miss appointments and was given a calendar of his treatments. -PET CT scan was reviewed with him in detail.-Extensive bone and bone marrow involvement noted.  He also has a pathologic nondisplaced left distal clavicular fracture and was recommended to avoid lifting anything more than 5 pounds with his left upper extremity. Treatment plans were updated based on his missed appointment I again reiterated the importance of setting of a primary care doctor and also follow-up with endocrinology to manage his hyperglycemia  Follow-up Please adjust patient's daratumumab/Velcade/dexamethasone treatments as per treatment plan changes required since the patient missed one of his treatments. Labs on day 1 8 and 15 of each cycle Starting on Zometa every 4 weeks x 6. First dose in 1 to 2 weeks from now. MD visit in 3 weeks  The total time spent in the appointment was 31 minutes*.  All of the patient's questions were answered with apparent satisfaction. The  patient knows to call the clinic with any problems, questions or concerns.   Sullivan Lone MD MS AAHIVMS [facility] Hematology/Oncology Physician [facility]  .*Total Encounter Time as defined by the Centers for Medicare and Medicaid Services includes, in addition to the face-to-face time of a patient visit (documented in the note above) non-face-to-face time: obtaining and reviewing outside history, ordering and reviewing medications, tests or procedures, care coordination (communications with other health care professionals or caregivers) and documentation in the medical record.

## 2022-06-09 ENCOUNTER — Other Ambulatory Visit: Payer: Self-pay | Admitting: Hematology

## 2022-06-09 NOTE — Telephone Encounter (Signed)
Oral Chemotherapy Pharmacist Encounter   Confirmed with [facility] that Revlimid will be delivered to patient's home 06/09/22. Patient aware that medication will require signature.   Will counsel patient in clinic on 06/10/22.  Leron Croak, PharmD, BCPS Hematology/Oncology Clinical Pharmacist [facility] [phone] 06/09/2022 10:41 AM

## 2022-06-10 ENCOUNTER — Inpatient Hospital Stay: Payer: BC Managed Care – PPO

## 2022-06-10 NOTE — Telephone Encounter (Signed)
Oral Chemotherapy Pharmacist Encounter  I spoke with patient with use of interpreter Emogene Morgan) for overview of: Revlimid for the treatment of R/R multiple myeloma in conjunction with daratumumab, bortezomib, and dexamethasone, planned duration until disease progression or unacceptable drug toxicity.  Counseled patient on administration, dosing, side effects, monitoring, drug-food interactions, safe handling, storage, and disposal.  Patient will take Revlimid 38m capsules, 1 capsule by mouth once daily, without regard to food, with a full glass of water.  Revlimid will be given 21 days on, 7 days off, repeat every 28 days.  Revlimid start date: 06/09/22  Adverse effects of Revlimid include but are not limited to: nausea, constipation, diarrhea, abdominal pain, rash, fatigue, and decreased blood counts.    Reviewed with patient importance of keeping a medication schedule and plan for any missed doses. No barriers to medication adherence identified.  Medication reconciliation performed and medication/allergy list updated.  Patient has picked up acyclovir and dexamethasone prescriptions. Patient counseled on importance of daily aspirin 860mfor VTE prophylaxis. He will pick this up at a local pharmacy to start taking.  Insurance authorization for Revlimid has been obtained.  Revlimid prescription is being dispensed from [facility] as it is a limited distribution medication. First shipment was delivered to patient's home on 06/09/22.  All questions answered.  Mr. RmKerwinoiced understanding and appreciation.   Medication education handout placed in mail for patient. Patient knows to call the office with questions or concerns. Oral Chemotherapy Clinic phone number provided to patient.   ReLeron CroakPharmD, BCPS Hematology/Oncology Clinical Pharmacist [facility] [phone]/13/2023 11:19 AM

## 2022-06-12 ENCOUNTER — Other Ambulatory Visit: Payer: Self-pay | Admitting: Hematology

## 2022-06-12 ENCOUNTER — Encounter: Payer: Self-pay | Admitting: Hematology

## 2022-06-12 DIAGNOSIS — C9000 Multiple myeloma not having achieved remission: Secondary | ICD-10-CM

## 2022-06-12 MED FILL — Dexamethasone Sodium Phosphate Inj 100 MG/10ML: INTRAMUSCULAR | Qty: 2 | Status: AC

## 2022-06-13 ENCOUNTER — Inpatient Hospital Stay: Payer: BC Managed Care – PPO

## 2022-06-13 ENCOUNTER — Other Ambulatory Visit: Payer: Self-pay

## 2022-06-13 ENCOUNTER — Other Ambulatory Visit: Payer: Self-pay | Admitting: Hematology

## 2022-06-13 VITALS — BP 108/77 | HR 84 | Temp 97.8°F | Resp 18 | Wt 164.8 lb

## 2022-06-13 DIAGNOSIS — C9000 Multiple myeloma not having achieved remission: Secondary | ICD-10-CM

## 2022-06-13 DIAGNOSIS — Z5111 Encounter for antineoplastic chemotherapy: Secondary | ICD-10-CM | POA: Diagnosis not present

## 2022-06-13 DIAGNOSIS — Z7189 Other specified counseling: Secondary | ICD-10-CM

## 2022-06-13 LAB — CBC WITH DIFFERENTIAL (CANCER CENTER ONLY)
Abs Immature Granulocytes: 0.02 10*3/uL (ref 0.00–0.07)
Basophils Absolute: 0 10*3/uL (ref 0.0–0.1)
Basophils Relative: 0 %
Eosinophils Absolute: 0.1 10*3/uL (ref 0.0–0.5)
Eosinophils Relative: 2 %
HCT: 32.2 % — ABNORMAL LOW (ref 39.0–52.0)
Hemoglobin: 10.6 g/dL — ABNORMAL LOW (ref 13.0–17.0)
Immature Granulocytes: 0 %
Lymphocytes Relative: 26 %
Lymphs Abs: 2 10*3/uL (ref 0.7–4.0)
MCH: 28.3 pg (ref 26.0–34.0)
MCHC: 32.9 g/dL (ref 30.0–36.0)
MCV: 85.9 fL (ref 80.0–100.0)
Monocytes Absolute: 0.6 10*3/uL (ref 0.1–1.0)
Monocytes Relative: 8 %
Neutro Abs: 4.8 10*3/uL (ref 1.7–7.7)
Neutrophils Relative %: 64 %
Platelet Count: 246 10*3/uL (ref 150–400)
RBC: 3.75 MIL/uL — ABNORMAL LOW (ref 4.22–5.81)
RDW: 18.4 % — ABNORMAL HIGH (ref 11.5–15.5)
WBC Count: 7.5 10*3/uL (ref 4.0–10.5)
nRBC: 0 % (ref 0.0–0.2)

## 2022-06-13 LAB — CMP (CANCER CENTER ONLY)
ALT: 27 U/L (ref 0–44)
AST: 16 U/L (ref 15–41)
Albumin: 3.4 g/dL — ABNORMAL LOW (ref 3.5–5.0)
Alkaline Phosphatase: 249 U/L — ABNORMAL HIGH (ref 38–126)
Anion gap: 6 (ref 5–15)
BUN: 11 mg/dL (ref 6–20)
CO2: 26 mmol/L (ref 22–32)
Calcium: 8.6 mg/dL — ABNORMAL LOW (ref 8.9–10.3)
Chloride: 103 mmol/L (ref 98–111)
Creatinine: 0.75 mg/dL (ref 0.61–1.24)
GFR, Estimated: >60 mL/min (ref >60)
Glucose, Bld: 269 mg/dL — ABNORMAL HIGH (ref 70–99)
Potassium: 3.9 mmol/L (ref 3.5–5.1)
Sodium: 135 mmol/L (ref 135–145)
Total Bilirubin: 0.5 mg/dL (ref 0.3–1.2)
Total Protein: 7.5 g/dL (ref 6.5–8.1)

## 2022-06-13 MED ORDER — SODIUM CHLORIDE 0.9 % IV SOLN
20.0000 mg | Freq: Once | INTRAVENOUS | Status: AC
  Administered 2022-06-13: 20 mg via INTRAVENOUS
  Filled 2022-06-13: qty 20

## 2022-06-13 MED ORDER — DIPHENHYDRAMINE HCL 25 MG PO CAPS
50.0000 mg | ORAL_CAPSULE | Freq: Once | ORAL | Status: AC
  Administered 2022-06-13: 50 mg via ORAL
  Filled 2022-06-13: qty 2

## 2022-06-13 MED ORDER — SODIUM CHLORIDE 0.9 % IV SOLN: Freq: Once | INTRAVENOUS | Status: AC

## 2022-06-13 MED ORDER — SODIUM CHLORIDE 0.9 % IV SOLN: 16.0000 mg/kg | Freq: Once | INTRAVENOUS | Status: DC

## 2022-06-13 MED ORDER — MONTELUKAST SODIUM 10 MG PO TABS
10.0000 mg | ORAL_TABLET | Freq: Once | ORAL | Status: AC
  Administered 2022-06-13: 10 mg via ORAL
  Filled 2022-06-13: qty 1

## 2022-06-13 MED ORDER — SODIUM CHLORIDE 0.9 % IV SOLN
16.0000 mg/kg | Freq: Once | INTRAVENOUS | Status: AC
  Administered 2022-06-13: 1300 mg via INTRAVENOUS
  Filled 2022-06-13: qty 60

## 2022-06-13 MED ORDER — ACETAMINOPHEN 500 MG PO TABS
1000.0000 mg | ORAL_TABLET | Freq: Once | ORAL | Status: AC
  Administered 2022-06-13: 1000 mg via ORAL
  Filled 2022-06-13: qty 2

## 2022-06-13 MED ORDER — ZOLEDRONIC ACID 4 MG/100ML IV SOLN
4.0000 mg | Freq: Once | INTRAVENOUS | Status: AC
  Administered 2022-06-13: 4 mg via INTRAVENOUS
  Filled 2022-06-13: qty 100

## 2022-06-13 MED ORDER — BORTEZOMIB CHEMO SQ INJECTION 3.5 MG (2.5MG/ML)
1.3000 mg/m2 | Freq: Once | INTRAMUSCULAR | Status: AC
  Administered 2022-06-13: 2.5 mg via SUBCUTANEOUS
  Filled 2022-06-13: qty 1

## 2022-06-13 NOTE — Patient Instructions (Signed)
[facility] MEDICAL ONCOLOGY  Discharge Instructions: Thank you for choosing [facility] to provide your oncology and hematology care.   If you have a lab appointment with the Cancer Center, please go directly to the Cancer Center and check in at the registration area.   Wear comfortable clothing and clothing appropriate for easy access to any Portacath or PICC line.   We strive to give you quality time with your provider. You may need to reschedule your appointment if you arrive late (15 or more minutes).  Arriving late affects you and other patients whose appointments are after yours.  Also, if you miss three or more appointments without notifying the office, you may be dismissed from the clinic at the provider's discretion.      For prescription refill requests, have your pharmacy contact our office and allow 72 hours for refills to be completed.    Today you received the following chemotherapy and/or immunotherapy agents: daratumumab, bortezomib      To help prevent nausea and vomiting after your treatment, we encourage you to take your nausea medication as directed.  BELOW ARE SYMPTOMS THAT SHOULD BE REPORTED IMMEDIATELY: *FEVER GREATER THAN 100.4 F (38 C) OR HIGHER *CHILLS OR SWEATING *NAUSEA AND VOMITING THAT IS NOT CONTROLLED WITH YOUR NAUSEA MEDICATION *UNUSUAL SHORTNESS OF BREATH *UNUSUAL BRUISING OR BLEEDING *URINARY PROBLEMS (pain or burning when urinating, or frequent urination) *BOWEL PROBLEMS (unusual diarrhea, constipation, pain near the anus) TENDERNESS IN MOUTH AND THROAT WITH OR WITHOUT PRESENCE OF ULCERS (sore throat, sores in mouth, or a toothache) UNUSUAL RASH, SWELLING OR PAIN  UNUSUAL VAGINAL DISCHARGE OR ITCHING   Items with * indicate a potential emergency and should be followed up as soon as possible or go to the Emergency Department if any problems should occur.  Please show the CHEMOTHERAPY ALERT CARD or IMMUNOTHERAPY ALERT CARD  at check-in to the Emergency Department and triage nurse.  Should you have questions after your visit or need to cancel or reschedule your appointment, please contact [facility] MEDICAL ONCOLOGY  Dept: [phone]  and follow the prompts.  Office hours are 8:00 a.m. to 4:30 p.m. Monday - Friday. Please note that voicemails left after 4:00 p.m. may not be returned until the following business day.  We are closed weekends and major holidays. You have access to a nurse at all times for urgent questions. Please call the main number to the clinic Dept: [phone] and follow the prompts.   For any non-urgent questions, you may also contact your provider using MyChart. We now offer e-Visits for anyone 18 and older to request care online for non-urgent symptoms. For details visit [email or web address].   Also download the MyChart app! Go to the app store, search "MyChart", open the app, select [facility], and log in with your MyChart username and password.  Masks are optional in the cancer centers. If you would like for your care team to wear a mask while they are taking care of you, please let them know. For doctor visits, patients may have with them one support person who is at least 55 years old. At this time, visitors are not allowed in the infusion area. 

## 2022-06-13 NOTE — Progress Notes (Signed)
Accelerated Daratumumab Infusion Pharmacist Review   Bladen Searls is a 56 y.o. male being treated with daratumumab for myeloma. This patient may be considered for the accelerated infusion protocol.    [x]  Patient has received at least 2 prior doses of daratumumab at standard infusion rates.  Infusion Date 1 May 21, 2022  Infusion Date 2  June 06, 2022   [x]  No documented infusion related reactions with prior infusions.   [x]  Physician approval of accelerated 90 minute infusion daratumumab.   [x]  Orders updated in current Treatment Plan to reflect accelerated 90 minute infusion protocol with further doses.   Kennith Center, Pharm.D., CPP 06/13/2022@10 :37 AM     Resource: Angela Burke., et al. Ninety-minute daratumumab infusion is safe in multiple myeloma. Leukemia. 2018;32:2495-2518. doi: 49.9718/E09906-893-4068-4

## 2022-06-16 ENCOUNTER — Inpatient Hospital Stay: Payer: BC Managed Care – PPO | Admitting: Nurse Practitioner

## 2022-06-17 ENCOUNTER — Inpatient Hospital Stay: Payer: BC Managed Care – PPO | Admitting: Hematology

## 2022-06-17 ENCOUNTER — Inpatient Hospital Stay: Payer: BC Managed Care – PPO

## 2022-06-18 LAB — MULTIPLE MYELOMA PANEL, SERUM
Albumin SerPl Elph-Mcnc: 2.8 g/dL — ABNORMAL LOW (ref 2.9–4.4)
Albumin/Glob SerPl: 0.8 (ref 0.7–1.7)
Alpha 1: 0.2 g/dL (ref 0.0–0.4)
Alpha2 Glob SerPl Elph-Mcnc: 1.1 g/dL — ABNORMAL HIGH (ref 0.4–1.0)
B-Globulin SerPl Elph-Mcnc: 0.8 g/dL (ref 0.7–1.3)
Gamma Glob SerPl Elph-Mcnc: 1.8 g/dL (ref 0.4–1.8)
Globulin, Total: 3.9 g/dL (ref 2.2–3.9)
IgA: 14 mg/dL — ABNORMAL LOW (ref 90–386)
IgG (Immunoglobin G), Serum: 1808 mg/dL — ABNORMAL HIGH (ref 603–1613)
IgM (Immunoglobulin M), Srm: 14 mg/dL — ABNORMAL LOW (ref 20–172)
M Protein SerPl Elph-Mcnc: 1.7 g/dL — ABNORMAL HIGH
Total Protein ELP: 6.7 g/dL (ref 6.0–8.5)

## 2022-06-19 ENCOUNTER — Other Ambulatory Visit: Payer: Self-pay

## 2022-06-19 DIAGNOSIS — C9000 Multiple myeloma not having achieved remission: Secondary | ICD-10-CM

## 2022-06-20 ENCOUNTER — Inpatient Hospital Stay: Payer: BC Managed Care – PPO

## 2022-06-20 ENCOUNTER — Other Ambulatory Visit: Payer: Self-pay

## 2022-06-20 VITALS — BP 110/69 | HR 62 | Temp 98.3°F | Resp 16 | Wt 165.1 lb

## 2022-06-20 DIAGNOSIS — Z5111 Encounter for antineoplastic chemotherapy: Secondary | ICD-10-CM | POA: Diagnosis not present

## 2022-06-20 DIAGNOSIS — Z7189 Other specified counseling: Secondary | ICD-10-CM

## 2022-06-20 DIAGNOSIS — C9000 Multiple myeloma not having achieved remission: Secondary | ICD-10-CM

## 2022-06-20 LAB — CBC WITH DIFFERENTIAL (CANCER CENTER ONLY)
Abs Immature Granulocytes: 0.03 10*3/uL (ref 0.00–0.07)
Basophils Absolute: 0 10*3/uL (ref 0.0–0.1)
Basophils Relative: 0 %
Eosinophils Absolute: 0.2 10*3/uL (ref 0.0–0.5)
Eosinophils Relative: 3 %
HCT: 34.6 % — ABNORMAL LOW (ref 39.0–52.0)
Hemoglobin: 11.5 g/dL — ABNORMAL LOW (ref 13.0–17.0)
Immature Granulocytes: 0 %
Lymphocytes Relative: 34 %
Lymphs Abs: 2.8 10*3/uL (ref 0.7–4.0)
MCH: 27.8 pg (ref 26.0–34.0)
MCHC: 33.2 g/dL (ref 30.0–36.0)
MCV: 83.8 fL (ref 80.0–100.0)
Monocytes Absolute: 0.8 10*3/uL (ref 0.1–1.0)
Monocytes Relative: 10 %
Neutro Abs: 4.4 10*3/uL (ref 1.7–7.7)
Neutrophils Relative %: 53 %
Platelet Count: 247 10*3/uL (ref 150–400)
RBC: 4.13 MIL/uL — ABNORMAL LOW (ref 4.22–5.81)
RDW: 18.1 % — ABNORMAL HIGH (ref 11.5–15.5)
WBC Count: 8.3 10*3/uL (ref 4.0–10.5)
nRBC: 0 % (ref 0.0–0.2)

## 2022-06-20 LAB — CMP (CANCER CENTER ONLY)
ALT: 30 U/L (ref 0–44)
AST: 15 U/L (ref 15–41)
Albumin: 3.4 g/dL — ABNORMAL LOW (ref 3.5–5.0)
Alkaline Phosphatase: 202 U/L — ABNORMAL HIGH (ref 38–126)
Anion gap: 8 (ref 5–15)
BUN: 11 mg/dL (ref 6–20)
CO2: 23 mmol/L (ref 22–32)
Calcium: 8 mg/dL — ABNORMAL LOW (ref 8.9–10.3)
Chloride: 105 mmol/L (ref 98–111)
Creatinine: 0.62 mg/dL (ref 0.61–1.24)
GFR, Estimated: >60 mL/min (ref >60)
Glucose, Bld: 244 mg/dL — ABNORMAL HIGH (ref 70–99)
Potassium: 3.5 mmol/L (ref 3.5–5.1)
Sodium: 136 mmol/L (ref 135–145)
Total Bilirubin: 0.4 mg/dL (ref 0.3–1.2)
Total Protein: 6.5 g/dL (ref 6.5–8.1)

## 2022-06-20 LAB — LACTATE DEHYDROGENASE: LDH: 153 U/L (ref 98–192)

## 2022-06-20 MED ORDER — BORTEZOMIB CHEMO SQ INJECTION 3.5 MG (2.5MG/ML)
1.3000 mg/m2 | Freq: Once | INTRAMUSCULAR | Status: AC
  Administered 2022-06-20: 2.5 mg via SUBCUTANEOUS
  Filled 2022-06-20: qty 1

## 2022-06-20 MED ORDER — DIPHENHYDRAMINE HCL 25 MG PO CAPS
50.0000 mg | ORAL_CAPSULE | Freq: Once | ORAL | Status: AC
  Administered 2022-06-20: 50 mg via ORAL
  Filled 2022-06-20: qty 2

## 2022-06-20 MED ORDER — SODIUM CHLORIDE 0.9 % IV SOLN: Freq: Once | INTRAVENOUS | Status: AC

## 2022-06-20 MED ORDER — SODIUM CHLORIDE 0.9 % IV SOLN
16.0000 mg/kg | Freq: Once | INTRAVENOUS | Status: AC
  Administered 2022-06-20: 1300 mg via INTRAVENOUS
  Filled 2022-06-20: qty 5

## 2022-06-20 MED ORDER — SODIUM CHLORIDE 0.9 % IV SOLN
20.0000 mg | Freq: Once | INTRAVENOUS | Status: AC
  Administered 2022-06-20: 20 mg via INTRAVENOUS
  Filled 2022-06-20: qty 20

## 2022-06-20 MED ORDER — ACETAMINOPHEN 500 MG PO TABS
1000.0000 mg | ORAL_TABLET | Freq: Once | ORAL | Status: AC
  Administered 2022-06-20: 1000 mg via ORAL
  Filled 2022-06-20: qty 2

## 2022-06-23 ENCOUNTER — Inpatient Hospital Stay: Payer: BC Managed Care – PPO

## 2022-06-23 ENCOUNTER — Other Ambulatory Visit: Payer: BC Managed Care – PPO

## 2022-06-23 VITALS — BP 131/78 | HR 89 | Temp 98.9°F | Resp 18

## 2022-06-23 DIAGNOSIS — Z5111 Encounter for antineoplastic chemotherapy: Secondary | ICD-10-CM | POA: Diagnosis not present

## 2022-06-23 DIAGNOSIS — C9000 Multiple myeloma not having achieved remission: Secondary | ICD-10-CM

## 2022-06-23 DIAGNOSIS — Z7189 Other specified counseling: Secondary | ICD-10-CM

## 2022-06-23 MED ORDER — BORTEZOMIB CHEMO SQ INJECTION 3.5 MG (2.5MG/ML)
1.3000 mg/m2 | Freq: Once | INTRAMUSCULAR | Status: AC
  Administered 2022-06-23: 2.5 mg via SUBCUTANEOUS
  Filled 2022-06-23: qty 1

## 2022-06-23 MED ORDER — PROCHLORPERAZINE MALEATE 10 MG PO TABS
10.0000 mg | ORAL_TABLET | Freq: Once | ORAL | Status: AC
  Administered 2022-06-23: 10 mg via ORAL
  Filled 2022-06-23: qty 1

## 2022-06-23 NOTE — Progress Notes (Signed)
[facility] CSW Progress Note  Visual merchandiser met with patient to address goals of care and advance directives.  Patient's interpreter, Luberta Robertson, was present.  CSW attempted to discuss HCPOA and Living Will and provided him with a copy of the document since his daughter reads [place].  His goal is to remain in his home with his family.  CSW to follow up with patient to assess for any questions or concerns about Advance Directives.    Pascal Lux Teresita Fanton, LCSW

## 2022-06-24 ENCOUNTER — Other Ambulatory Visit: Payer: Self-pay | Admitting: *Deleted

## 2022-06-24 ENCOUNTER — Ambulatory Visit: Payer: BC Managed Care – PPO

## 2022-06-24 ENCOUNTER — Other Ambulatory Visit: Payer: BC Managed Care – PPO

## 2022-06-24 DIAGNOSIS — C9000 Multiple myeloma not having achieved remission: Secondary | ICD-10-CM

## 2022-06-26 MED FILL — Dexamethasone Sodium Phosphate Inj 100 MG/10ML: INTRAMUSCULAR | Qty: 2 | Status: AC

## 2022-06-27 ENCOUNTER — Inpatient Hospital Stay: Payer: BC Managed Care – PPO

## 2022-06-27 ENCOUNTER — Inpatient Hospital Stay (HOSPITAL_BASED_OUTPATIENT_CLINIC_OR_DEPARTMENT_OTHER): Payer: BC Managed Care – PPO | Admitting: Hematology

## 2022-06-27 ENCOUNTER — Other Ambulatory Visit: Payer: Self-pay | Admitting: Hematology

## 2022-06-27 ENCOUNTER — Other Ambulatory Visit: Payer: Self-pay

## 2022-06-27 VITALS — BP 95/64 | HR 73 | Temp 98.1°F | Resp 18 | Wt 164.8 lb

## 2022-06-27 DIAGNOSIS — C9000 Multiple myeloma not having achieved remission: Secondary | ICD-10-CM | POA: Diagnosis not present

## 2022-06-27 DIAGNOSIS — Z5111 Encounter for antineoplastic chemotherapy: Secondary | ICD-10-CM

## 2022-06-27 DIAGNOSIS — Z7189 Other specified counseling: Secondary | ICD-10-CM

## 2022-06-27 LAB — CMP (CANCER CENTER ONLY)
ALT: 35 U/L (ref 0–44)
AST: 17 U/L (ref 15–41)
Albumin: 3.3 g/dL — ABNORMAL LOW (ref 3.5–5.0)
Alkaline Phosphatase: 143 U/L — ABNORMAL HIGH (ref 38–126)
Anion gap: 6 (ref 5–15)
BUN: 11 mg/dL (ref 6–20)
CO2: 25 mmol/L (ref 22–32)
Calcium: 8.1 mg/dL — ABNORMAL LOW (ref 8.9–10.3)
Chloride: 104 mmol/L (ref 98–111)
Creatinine: 0.58 mg/dL — ABNORMAL LOW (ref 0.61–1.24)
GFR, Estimated: >60 mL/min (ref >60)
Glucose, Bld: 177 mg/dL — ABNORMAL HIGH (ref 70–99)
Potassium: 3.5 mmol/L (ref 3.5–5.1)
Sodium: 135 mmol/L (ref 135–145)
Total Bilirubin: 1 mg/dL (ref 0.3–1.2)
Total Protein: 6.1 g/dL — ABNORMAL LOW (ref 6.5–8.1)

## 2022-06-27 LAB — CBC WITH DIFFERENTIAL (CANCER CENTER ONLY)
Abs Immature Granulocytes: 0.04 10*3/uL (ref 0.00–0.07)
Basophils Absolute: 0 10*3/uL (ref 0.0–0.1)
Basophils Relative: 0 %
Eosinophils Absolute: 0.3 10*3/uL (ref 0.0–0.5)
Eosinophils Relative: 3 %
HCT: 33.2 % — ABNORMAL LOW (ref 39.0–52.0)
Hemoglobin: 10.7 g/dL — ABNORMAL LOW (ref 13.0–17.0)
Immature Granulocytes: 0 %
Lymphocytes Relative: 21 %
Lymphs Abs: 1.9 10*3/uL (ref 0.7–4.0)
MCH: 26.9 pg (ref 26.0–34.0)
MCHC: 32.2 g/dL (ref 30.0–36.0)
MCV: 83.4 fL (ref 80.0–100.0)
Monocytes Absolute: 1.2 10*3/uL — ABNORMAL HIGH (ref 0.1–1.0)
Monocytes Relative: 13 %
Neutro Abs: 5.6 10*3/uL (ref 1.7–7.7)
Neutrophils Relative %: 63 %
Platelet Count: 202 10*3/uL (ref 150–400)
RBC: 3.98 MIL/uL — ABNORMAL LOW (ref 4.22–5.81)
RDW: 18.6 % — ABNORMAL HIGH (ref 11.5–15.5)
WBC Count: 9.1 10*3/uL (ref 4.0–10.5)
nRBC: 0 % (ref 0.0–0.2)

## 2022-06-27 LAB — LACTATE DEHYDROGENASE: LDH: 141 U/L (ref 98–192)

## 2022-06-27 MED ORDER — SODIUM CHLORIDE 0.9 % IV SOLN
16.0000 mg/kg | Freq: Once | INTRAVENOUS | Status: AC
  Administered 2022-06-27: 1300 mg via INTRAVENOUS
  Filled 2022-06-27: qty 60

## 2022-06-27 MED ORDER — BORTEZOMIB CHEMO SQ INJECTION 3.5 MG (2.5MG/ML)
1.3000 mg/m2 | Freq: Once | INTRAMUSCULAR | Status: AC
  Administered 2022-06-27: 2.5 mg via SUBCUTANEOUS
  Filled 2022-06-27: qty 1

## 2022-06-27 MED ORDER — SODIUM CHLORIDE 0.9 % IV SOLN
20.0000 mg | Freq: Once | INTRAVENOUS | Status: AC
  Administered 2022-06-27: 20 mg via INTRAVENOUS
  Filled 2022-06-27: qty 20

## 2022-06-27 MED ORDER — ACETAMINOPHEN 500 MG PO TABS
1000.0000 mg | ORAL_TABLET | Freq: Once | ORAL | Status: AC
  Administered 2022-06-27: 1000 mg via ORAL
  Filled 2022-06-27: qty 2

## 2022-06-27 MED ORDER — SODIUM CHLORIDE 0.9 % IV SOLN: Freq: Once | INTRAVENOUS | Status: AC

## 2022-06-27 MED ORDER — DIPHENHYDRAMINE HCL 25 MG PO CAPS
50.0000 mg | ORAL_CAPSULE | Freq: Once | ORAL | Status: AC
  Administered 2022-06-27: 50 mg via ORAL
  Filled 2022-06-27: qty 2

## 2022-06-27 NOTE — Patient Instructions (Signed)
[facility] ONCOLOGY  Discharge Instructions: Thank you for choosing [facility] to provide your oncology and hematology care.   If you have a lab appointment with the [facility], please go directly to the [facility] and check in at the registration area.   Wear comfortable clothing and clothing appropriate for easy access to any Portacath or PICC line.   We strive to give you quality time with your provider. You may need to reschedule your appointment if you arrive late (15 or more minutes).  Arriving late affects you and other patients whose appointments are after yours.  Also, if you miss three or more appointments without notifying the office, you may be dismissed from the clinic at the provider's discretion.      For prescription refill requests, have your pharmacy contact our office and allow 72 hours for refills to be completed.    Today you received the following chemotherapy and/or immunotherapy agents Velcade and Darzalex      To help prevent nausea and vomiting after your treatment, we encourage you to take your nausea medication as directed.  BELOW ARE SYMPTOMS THAT SHOULD BE REPORTED IMMEDIATELY: *FEVER GREATER THAN 100.4 F (38 C) OR HIGHER *CHILLS OR SWEATING *NAUSEA AND VOMITING THAT IS NOT CONTROLLED WITH YOUR NAUSEA MEDICATION *UNUSUAL SHORTNESS OF BREATH *UNUSUAL BRUISING OR BLEEDING *URINARY PROBLEMS (pain or burning when urinating, or frequent urination) *BOWEL PROBLEMS (unusual diarrhea, constipation, pain near the anus) TENDERNESS IN MOUTH AND THROAT WITH OR WITHOUT PRESENCE OF ULCERS (sore throat, sores in mouth, or a toothache) UNUSUAL RASH, SWELLING OR PAIN  UNUSUAL VAGINAL DISCHARGE OR ITCHING   Items with * indicate a potential emergency and should be followed up as soon as possible or go to the Emergency Department if any problems should occur.  Please show the CHEMOTHERAPY ALERT CARD or IMMUNOTHERAPY ALERT CARD at  check-in to the Emergency Department and triage nurse.  Should you have questions after your visit or need to cancel or reschedule your appointment, please contact [facility]  Dept: [phone]  and follow the prompts.  Office hours are 8:00 a.m. to 4:30 p.m. Monday - Friday. Please note that voicemails left after 4:00 p.m. may not be returned until the following business day.  We are closed weekends and major holidays. You have access to a nurse at all times for urgent questions. Please call the main number to the clinic Dept: [phone] and follow the prompts.   For any non-urgent questions, you may also contact your provider using MyChart. We now offer e-Visits for anyone 43 and older to request care online for non-urgent symptoms. For details visit [email or web address].   Also download the MyChart app! Go to the app store, search "MyChart", open the app, select [facility], and log in with your MyChart username and password.  Due to Covid, a mask is required upon entering the hospital/clinic. If you do not have a mask, one will be given to you upon arrival. For doctor visits, patients may have 1 support person aged 55 or older with them. For treatment visits, patients cannot have anyone with them due to current Covid guidelines and our immunocompromised population.

## 2022-06-27 NOTE — Progress Notes (Signed)
[name]  HEMATOLOGY/ONCOLOGY CLINIC NOTE  Date of Service: 06/27/2022  Chief complaint: Follow-up for continued valuation and management of high risk multiple myeloma  Current treatment-daratumumab/Revlimid/Velcade/dexamethasone  INTERVAL HISTORY: Donald Suarez is a 56 y.o. male is here for continued evaluation and management of his high-dose multiple myeloma and for toxicity check after his first dose of daratumumab/Velcade/dexamethasone. He reports He is doing well with no new symptoms or concerns.  He reports that his vision has improved but the left side is still blurry.   He notes loss of taste occasionally.   He reports chills occasionally. He notes significant night sweats.  He was advised to maintain good hydration by drinking 48-64 oz water per day which he notes he does already.  No numbness or tingling. No issues urinating No fever or infection issues. No other new or acute focal symptoms.  He is tolerating Revlimid at 15 mg p.o. well with no prohibitive toxicities at this time.  He has not yet set up a primary care physician or endocrinology appointment.  Notes no other toxicities from his treatment at this time  Labs done today were reviewed with him in detail.  OBJECTIVE:  NAD  PHYSICAL EXAMINATION: . There were no vitals filed for this visit.  There were no vitals filed for this visit.  .There is no height or weight on file to calculate BMI. NAD GENERAL:alert, in no acute distress and comfortable SKIN: no acute rashes, no significant lesions EYES: conjunctiva are pink and non-injected, sclera anicteric NECK: supple, no JVD LYMPH:  no palpable lymphadenopathy in the cervical, axillary or inguinal regions LUNGS: clear to auscultation b/l with normal respiratory effort HEART: regular rate & rhythm ABDOMEN:  normoactive bowel sounds , non tender, not distended. Extremity: no pedal edema PSYCH: alert & oriented x 3 with fluent speech NEURO: no focal motor/sensory  deficits  MEDICAL HISTORY:  Past Medical History:  Diagnosis Date   Anemia     SURGICAL HISTORY: Past Surgical History:  Procedure Laterality Date   APPENDECTOMY     Tegh FLUORO GUIDE CV LINE RIGHT  05/15/2022   Bralen PATIENT EVAL TECH 0-60 MINS  05/19/2022   Antron US GUIDE VASC ACCESS RIGHT  05/15/2022    SOCIAL HISTORY: Social History   Socioeconomic History   Marital status: Married    Spouse name: Not on file   Number of children: Not on file   Years of education: Not on file   Highest education level: Not on file  Occupational History   Not on file  Tobacco Use   Smoking status: Never   Smokeless tobacco: Never  Vaping Use   Vaping Use: Never used  Substance and Sexual Activity   Alcohol use: Never   Drug use: Never   Sexual activity: Not on file  Other Topics Concern   Not on file  Social History Narrative   Not on file   Social Determinants of Health   Financial Resource Strain: Medium Risk (05/21/2022)   Overall Financial Resource Strain (CARDIA)    Difficulty of Paying Living Expenses: Somewhat hard  Food Insecurity: Food Insecurity Present (05/21/2022)   Hunger Vital Sign    Worried About Running Out of Food in the Last Year: Sometimes true    Ran Out of Food in the Last Year: Sometimes true  Transportation Needs: No Transportation Needs (05/21/2022)   PRAPARE - Hydrologist (Medical): No    Lack of Transportation (Non-Medical): No  Physical Activity: Not on  file  Stress: Not on file  Social Connections: Not on file  Intimate Partner Violence: Not on file    FAMILY HISTORY: No family history on file.  ALLERGIES:  has No Known Allergies.  MEDICATIONS:  Reviewed REVIEW OF SYSTEMS:    10 Point review of Systems was done is negative except as noted above.  LABORATORY DATA:  I have reviewed the data as listed  .    Latest Ref Rng & Units 06/27/2022   10:13 AM 06/20/2022    9:00 AM 06/13/2022    9:10 AM  CBC  WBC 4.0 -  10.5 K/uL 9.1  8.3  7.5   Hemoglobin 13.0 - 17.0 g/dL 10.7  11.5  10.6   Hematocrit 39.0 - 52.0 % 33.2  34.6  32.2   Platelets 150 - 400 K/uL 202  247  246     .    Latest Ref Rng & Units 06/27/2022   10:13 AM 06/20/2022    9:00 AM 06/13/2022    9:10 AM  CMP  Glucose 70 - 99 mg/dL 177  244  269   BUN 6 - 20 mg/dL 11  11  11    Creatinine 0.61 - 1.24 mg/dL 0.58  0.62  0.75   Sodium 135 - 145 mmol/L 135  136  135   Potassium 3.5 - 5.1 mmol/L 3.5  3.5  3.9   Chloride 98 - 111 mmol/L 104  105  103   CO2 22 - 32 mmol/L 25  23  26    Calcium 8.9 - 10.3 mg/dL 8.1  8.0  8.6   Total Protein 6.5 - 8.1 g/dL 6.1  6.5  7.5   Total Bilirubin 0.3 - 1.2 mg/dL 1.0  0.4  0.5   Alkaline Phos 38 - 126 U/L 143  202  249   AST 15 - 41 U/L 17  15  16    ALT 0 - 44 U/L 35  30  27      RADIOGRAPHIC STUDIES: I have personally reviewed the radiological images as listed and agreed with the findings in the report. No results found.  ASSESSMENT & PLAN:   1) Progressive IgG Lambda Multiple myeloma not on treatment for more than 1 year due to patient's lack of follow-up. Patient diagnosed December 2021 and received 1 cycle of CyBorD. Had previously presented with anemia renal insufficiency and hyperviscosity symptoms. Bx- 90% plasma cells Initial M spike 8.03g/dl Del(1p): Not Detected  Dup(1q): Not Detected  Gains(15): DETECTED  Gains(5 and 9): Not Detected  Del(13q)/-13: DETECTED  Del(17p)(TP53): Not Detected  IGH(Rearrangement): SEE BELOW   IgH complex: t(4;14): DETECTED  t(11;14): Not Detected  t(14;16): Not Detected  t(14;20): Not Detected   Cytogenetics: Normal male karyotype.   -Labs from 05/13/2022-beta-2 microglobulin 4.4, LDH 197, lambda free light chain 77.1, kappa, lambda light chain ratio 0.08, multiple myeloma panel pending.  UPEP ordered but not yet collected. 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  Due to multiple myeloma  PLAN: -Patient's labs from today were discussed in details. CBC stable. Hgb is 10.7, WBC count  is 9.1k, and platelets are 202k. CMP stable. LDH is 141. -He has no acute new treatment related toxicities at this time. -Patient is appropriate to continue with his daratumumab Velcade Revlimid treatment as per plan for his high risk myeloma.  Follow-up ***  The total time spent in the appointment was *** minutes*.  All of the patient's questions were answered with apparent satisfaction. The patient knows to call the clinic with any problems, questions or concerns.   Sullivan Lone MD MS AAHIVMS [facility] Hematology/Oncology Physician [facility]  .*Total Encounter Time as defined by the Centers for Medicare and Medicaid Services includes, in addition to the face-to-face time of a patient visit (documented in the note above) non-face-to-face time: obtaining and reviewing outside history, ordering and reviewing medications, tests or procedures, care coordination (communications with other health care professionals or caregivers) and documentation in the medical record.  I, Melene Muller, am acting as scribe for Dr. Sullivan Lone, MD.

## 2022-06-30 ENCOUNTER — Other Ambulatory Visit: Payer: Self-pay

## 2022-06-30 ENCOUNTER — Inpatient Hospital Stay: Payer: BC Managed Care – PPO

## 2022-06-30 ENCOUNTER — Inpatient Hospital Stay: Payer: BC Managed Care – PPO | Attending: Hematology

## 2022-06-30 VITALS — BP 119/79 | HR 72 | Temp 98.1°F | Resp 16 | Wt 165.0 lb

## 2022-06-30 DIAGNOSIS — C9000 Multiple myeloma not having achieved remission: Secondary | ICD-10-CM

## 2022-06-30 DIAGNOSIS — Z7189 Other specified counseling: Secondary | ICD-10-CM

## 2022-06-30 MED ORDER — PROCHLORPERAZINE MALEATE 10 MG PO TABS
10.0000 mg | ORAL_TABLET | Freq: Once | ORAL | Status: AC
  Administered 2022-06-30: 10 mg via ORAL
  Filled 2022-06-30: qty 1

## 2022-06-30 MED ORDER — BORTEZOMIB CHEMO SQ INJECTION 3.5 MG (2.5MG/ML)
1.3000 mg/m2 | Freq: Once | INTRAMUSCULAR | Status: AC
  Administered 2022-06-30: 2.5 mg via SUBCUTANEOUS
  Filled 2022-06-30: qty 1

## 2022-06-30 NOTE — Progress Notes (Signed)
[facility] CSW Progress Note  Clinical Education officer, museum met with patient to discuss goals of care and Advance Directives.  Interpreter was present.  Patient stated that his family will make all of his health care decisions if he is unable to.  He expressed that his culture is very family oriented and that he has confidence that they will make the appropriate decisions when and if needed.  He declined to complete the advance directives.  His goal is to remain at home with his family.  CSW provided patient with a bag of food from the food pantry in accordance to his culture.  Rodman Pickle Ardis Fullwood, LCSW

## 2022-06-30 NOTE — Patient Instructions (Signed)
[facility]COLOG[facility]  Discharge Instructions: Thank you for choosing [facility] to provide your oncology and hematology care.   If you have a lab appointment with the Cancer Center, please go directly to the Cancer Center and check in at the registration area.   Wear comfortable clothing and clothing appropriate for easy access to any Portacath or PICC line.   We strive to give you quality time with your provider. You may need to reschedule your appointment if you arrive late (15 or more minutes).  Arriving late affects you and other patients whose appointments are after yours.  Also, if you miss three or more appointments without notifying the office, you may be dismissed from the clinic at the provider's discretion.      For prescription refill requests, have your pharmacy contact our office and allow 72 hours for refills to be completed.    Today you received the following chemotherapy and/or immunotherapy agents Velcade       To help prevent nausea and vomiting after your treatment, we encourage you to take your nausea medication as directed.  BELOW ARE SYMPTOMS THAT SHOULD BE REPORTED IMMEDIATELY: *FEVER GREATER THAN 100.4 F (38 C) OR HIGHER *CHILLS OR SWEATING *NAUSEA AND VOMITING THAT IS NOT CONTROLLED WITH YOUR NAUSEA MEDICATION *UNUSUAL SHORTNESS OF BREATH *UNUSUAL BRUISING OR BLEEDING *URINARY PROBLEMS (pain or burning when urinating, or frequent urination) *BOWEL PROBLEMS (unusual diarrhea, constipation, pain near the anus) TENDERNESS IN MOUTH AND THROAT WITH OR WITHOUT PRESENCE OF ULCERS (sore throat, sores in mouth, or a toothache) UNUSUAL RASH, SWELLING OR PAIN  UNUSUAL VAGINAL DISCHARGE OR ITCHING   Items with * indicate a potential emergency and should be followed up as soon as possible or go to the Emergency Department if any problems should occur.  Please show the CHEMOTHERAPY ALERT CARD or IMMUNOTHERAPY ALERT CARD at check-in to  the Emergency Department and triage nurse.  Should you have questions after your visit or need to cancel or reschedule your appointment, please contact [facility]COL[facility]  Dept: [phone]  and follow the prompts.  Office hours are 8:00 a.m. to 4:30 p.m. Monday - Friday. Please note that voicemails left after 4:00 p.m. may not be returned until the following business day.  We are closed weekends and major holidays. You have access to a nurse at all times for urgent questions. Please call the main number to the clinic Dept: [phone] and follow the prompts.   For any non-urgent questions, you may also contact your provider using MyChart. We now offer e-Visits for anyone 18 and older to request care online for non-urgent symptoms. For details visit [email or web address].   Also download the MyChart app! Go to the app store, search "MyChart", open the app, select , and log in with your MyChart username and password.  Due to Covid, a mask is required upon entering the hospital/clinic. If you do not have a mask, one will be given to you upon arrival. For doctor visits, patients may have 1 support person aged 18 or older with them. For treatment visits, patients cannot have anyone with them due to current Covid guidelines and our immunocompromised population.   

## 2022-07-02 ENCOUNTER — Other Ambulatory Visit: Payer: Self-pay | Admitting: Hematology

## 2022-07-03 ENCOUNTER — Other Ambulatory Visit: Payer: Self-pay | Admitting: Surgery

## 2022-07-03 ENCOUNTER — Encounter: Payer: Self-pay | Admitting: Hematology

## 2022-07-03 DIAGNOSIS — C9000 Multiple myeloma not having achieved remission: Secondary | ICD-10-CM

## 2022-07-03 MED FILL — Dexamethasone Sodium Phosphate Inj 100 MG/10ML: INTRAMUSCULAR | Qty: 2 | Status: AC

## 2022-07-04 ENCOUNTER — Inpatient Hospital Stay: Payer: BC Managed Care – PPO

## 2022-07-09 ENCOUNTER — Other Ambulatory Visit: Payer: Self-pay | Admitting: Hematology

## 2022-07-10 MED FILL — Dexamethasone Sodium Phosphate Inj 100 MG/10ML: INTRAMUSCULAR | Qty: 2 | Status: AC

## 2022-07-11 ENCOUNTER — Other Ambulatory Visit: Payer: Self-pay

## 2022-07-11 ENCOUNTER — Inpatient Hospital Stay: Payer: BC Managed Care – PPO

## 2022-07-11 VITALS — BP 114/75 | HR 69 | Temp 98.3°F | Resp 18 | Wt 167.2 lb

## 2022-07-11 DIAGNOSIS — C9000 Multiple myeloma not having achieved remission: Secondary | ICD-10-CM

## 2022-07-11 DIAGNOSIS — Z7189 Other specified counseling: Secondary | ICD-10-CM

## 2022-07-11 LAB — CBC WITH DIFFERENTIAL (CANCER CENTER ONLY)
Abs Immature Granulocytes: 0.02 10*3/uL (ref 0.00–0.07)
Basophils Absolute: 0.1 10*3/uL (ref 0.0–0.1)
Basophils Relative: 2 %
Eosinophils Absolute: 0.1 10*3/uL (ref 0.0–0.5)
Eosinophils Relative: 1 %
HCT: 31.4 % — ABNORMAL LOW (ref 39.0–52.0)
Hemoglobin: 10 g/dL — ABNORMAL LOW (ref 13.0–17.0)
Immature Granulocytes: 0 %
Lymphocytes Relative: 45 %
Lymphs Abs: 2.8 10*3/uL (ref 0.7–4.0)
MCH: 25.8 pg — ABNORMAL LOW (ref 26.0–34.0)
MCHC: 31.8 g/dL (ref 30.0–36.0)
MCV: 80.9 fL (ref 80.0–100.0)
Monocytes Absolute: 0.6 10*3/uL (ref 0.1–1.0)
Monocytes Relative: 10 %
Neutro Abs: 2.7 10*3/uL (ref 1.7–7.7)
Neutrophils Relative %: 42 %
Platelet Count: 680 10*3/uL — ABNORMAL HIGH (ref 150–400)
RBC: 3.88 MIL/uL — ABNORMAL LOW (ref 4.22–5.81)
RDW: 19.5 % — ABNORMAL HIGH (ref 11.5–15.5)
WBC Count: 6.3 10*3/uL (ref 4.0–10.5)
nRBC: 0 % (ref 0.0–0.2)

## 2022-07-11 LAB — CMP (CANCER CENTER ONLY)
ALT: 23 U/L (ref 0–44)
AST: 15 U/L (ref 15–41)
Albumin: 3.4 g/dL — ABNORMAL LOW (ref 3.5–5.0)
Alkaline Phosphatase: 155 U/L — ABNORMAL HIGH (ref 38–126)
Anion gap: 6 (ref 5–15)
BUN: 10 mg/dL (ref 6–20)
CO2: 24 mmol/L (ref 22–32)
Calcium: 8.3 mg/dL — ABNORMAL LOW (ref 8.9–10.3)
Chloride: 110 mmol/L (ref 98–111)
Creatinine: 0.59 mg/dL — ABNORMAL LOW (ref 0.61–1.24)
GFR, Estimated: >60 mL/min (ref >60)
Glucose, Bld: 225 mg/dL — ABNORMAL HIGH (ref 70–99)
Potassium: 3.9 mmol/L (ref 3.5–5.1)
Sodium: 140 mmol/L (ref 135–145)
Total Bilirubin: 0.4 mg/dL (ref 0.3–1.2)
Total Protein: 6.1 g/dL — ABNORMAL LOW (ref 6.5–8.1)

## 2022-07-11 LAB — LACTATE DEHYDROGENASE: LDH: 135 U/L (ref 98–192)

## 2022-07-11 MED ORDER — SODIUM CHLORIDE 0.9 % IV SOLN
16.0000 mg/kg | Freq: Once | INTRAVENOUS | Status: AC
  Administered 2022-07-11: 1300 mg via INTRAVENOUS
  Filled 2022-07-11: qty 60

## 2022-07-11 MED ORDER — SODIUM CHLORIDE 0.9 % IV SOLN: Freq: Once | INTRAVENOUS | Status: AC

## 2022-07-11 MED ORDER — DIPHENHYDRAMINE HCL 25 MG PO CAPS
50.0000 mg | ORAL_CAPSULE | Freq: Once | ORAL | Status: AC
  Administered 2022-07-11: 50 mg via ORAL
  Filled 2022-07-11: qty 2

## 2022-07-11 MED ORDER — SODIUM CHLORIDE 0.9 % IV SOLN
20.0000 mg | Freq: Once | INTRAVENOUS | Status: AC
  Administered 2022-07-11: 20 mg via INTRAVENOUS
  Filled 2022-07-11: qty 20

## 2022-07-11 MED ORDER — ACETAMINOPHEN 500 MG PO TABS
1000.0000 mg | ORAL_TABLET | Freq: Once | ORAL | Status: AC
  Administered 2022-07-11: 1000 mg via ORAL
  Filled 2022-07-11: qty 2

## 2022-07-11 MED ORDER — BORTEZOMIB CHEMO SQ INJECTION 3.5 MG (2.5MG/ML)
1.3000 mg/m2 | Freq: Once | INTRAMUSCULAR | Status: AC
  Administered 2022-07-11: 2.5 mg via SUBCUTANEOUS
  Filled 2022-07-11: qty 1

## 2022-07-11 NOTE — Progress Notes (Signed)
Per Dr Lorenso Courier continue as planned with Tx for Day1 Cycle3: Donald Suarez

## 2022-07-11 NOTE — Patient Instructions (Signed)
[facility]COLOG[facility]  Discharge Instructions: Thank you for choosing [facility] to provide your oncology and hematology care.   If you have a lab appointment with the Cancer Center, please go directly to the Cancer Center and check in at the registration area.   Wear comfortable clothing and clothing appropriate for easy access to any Portacath or PICC line.   We strive to give you quality time with your provider. You may need to reschedule your appointment if you arrive late (15 or more minutes).  Arriving late affects you and other patients whose appointments are after yours.  Also, if you miss three or more appointments without notifying the office, you may be dismissed from the clinic at the provider's discretion.      For prescription refill requests, have your pharmacy contact our office and allow 72 hours for refills to be completed.    Today you received the following chemotherapy and/or immunotherapy agents Velcade       To help prevent nausea and vomiting after your treatment, we encourage you to take your nausea medication as directed.  BELOW ARE SYMPTOMS THAT SHOULD BE REPORTED IMMEDIATELY: *FEVER GREATER THAN 100.4 F (38 C) OR HIGHER *CHILLS OR SWEATING *NAUSEA AND VOMITING THAT IS NOT CONTROLLED WITH YOUR NAUSEA MEDICATION *UNUSUAL SHORTNESS OF BREATH *UNUSUAL BRUISING OR BLEEDING *URINARY PROBLEMS (pain or burning when urinating, or frequent urination) *BOWEL PROBLEMS (unusual diarrhea, constipation, pain near the anus) TENDERNESS IN MOUTH AND THROAT WITH OR WITHOUT PRESENCE OF ULCERS (sore throat, sores in mouth, or a toothache) UNUSUAL RASH, SWELLING OR PAIN  UNUSUAL VAGINAL DISCHARGE OR ITCHING   Items with * indicate a potential emergency and should be followed up as soon as possible or go to the Emergency Department if any problems should occur.  Please show the CHEMOTHERAPY ALERT CARD or IMMUNOTHERAPY ALERT CARD at check-in to  the Emergency Department and triage nurse.  Should you have questions after your visit or need to cancel or reschedule your appointment, please contact [facility]COL[facility]  Dept: [phone]  and follow the prompts.  Office hours are 8:00 a.m. to 4:30 p.m. Monday - Friday. Please note that voicemails left after 4:00 p.m. may not be returned until the following business day.  We are closed weekends and major holidays. You have access to a nurse at all times for urgent questions. Please call the main number to the clinic Dept: [phone] and follow the prompts.   For any non-urgent questions, you may also contact your provider using MyChart. We now offer e-Visits for anyone 18 and older to request care online for non-urgent symptoms. For details visit [email or web address].   Also download the MyChart app! Go to the app store, search "MyChart", open the app, select [facility], and log in with your MyChart username and password.  Due to Covid, a mask is required upon entering the hospital/clinic. If you do not have a mask, one will be given to you upon arrival. For doctor visits, patients may have 1 support person aged 18 or older with them. For treatment visits, patients cannot have anyone with them due to current Covid guidelines and our immunocompromised population.   

## 2022-07-14 ENCOUNTER — Inpatient Hospital Stay: Payer: BC Managed Care – PPO

## 2022-07-14 ENCOUNTER — Other Ambulatory Visit: Payer: Self-pay

## 2022-07-14 VITALS — BP 127/81 | HR 66 | Temp 98.3°F | Resp 18 | Wt 166.0 lb

## 2022-07-14 DIAGNOSIS — Z7189 Other specified counseling: Secondary | ICD-10-CM

## 2022-07-14 DIAGNOSIS — C9000 Multiple myeloma not having achieved remission: Secondary | ICD-10-CM | POA: Diagnosis not present

## 2022-07-14 MED ORDER — PROCHLORPERAZINE MALEATE 10 MG PO TABS
10.0000 mg | ORAL_TABLET | Freq: Once | ORAL | Status: AC
  Administered 2022-07-14: 10 mg via ORAL
  Filled 2022-07-14: qty 1

## 2022-07-14 MED ORDER — BORTEZOMIB CHEMO SQ INJECTION 3.5 MG (2.5MG/ML)
1.3000 mg/m2 | Freq: Once | INTRAMUSCULAR | Status: AC
  Administered 2022-07-14: 2.5 mg via SUBCUTANEOUS
  Filled 2022-07-14: qty 1

## 2022-07-14 NOTE — Patient Instructions (Signed)
[facility] ONCOLOGY  Discharge Instructions: Thank you for choosing [facility] to provide your oncology and hematology care.   If you have a lab appointment with the [facility], please go directly to the [facility] and check in at the registration area.   Wear comfortable clothing and clothing appropriate for easy access to any Portacath or PICC line.   We strive to give you quality time with your provider. You may need to reschedule your appointment if you arrive late (15 or more minutes).  Arriving late affects you and other patients whose appointments are after yours.  Also, if you miss three or more appointments without notifying the office, you may be dismissed from the clinic at the provider's discretion.      For prescription refill requests, have your pharmacy contact our office and allow 72 hours for refills to be completed.    Today you received the following chemotherapy and/or immunotherapy agents: Velcade.       To help prevent nausea and vomiting after your treatment, we encourage you to take your nausea medication as directed.  BELOW ARE SYMPTOMS THAT SHOULD BE REPORTED IMMEDIATELY: *FEVER GREATER THAN 100.4 F (38 C) OR HIGHER *CHILLS OR SWEATING *NAUSEA AND VOMITING THAT IS NOT CONTROLLED WITH YOUR NAUSEA MEDICATION *UNUSUAL SHORTNESS OF BREATH *UNUSUAL BRUISING OR BLEEDING *URINARY PROBLEMS (pain or burning when urinating, or frequent urination) *BOWEL PROBLEMS (unusual diarrhea, constipation, pain near the anus) TENDERNESS IN MOUTH AND THROAT WITH OR WITHOUT PRESENCE OF ULCERS (sore throat, sores in mouth, or a toothache) UNUSUAL RASH, SWELLING OR PAIN  UNUSUAL VAGINAL DISCHARGE OR ITCHING   Items with * indicate a potential emergency and should be followed up as soon as possible or go to the Emergency Department if any problems should occur.  Please show the CHEMOTHERAPY ALERT CARD or IMMUNOTHERAPY ALERT CARD at check-in to  the Emergency Department and triage nurse.  Should you have questions after your visit or need to cancel or reschedule your appointment, please contact [facility]  Dept: [phone]  and follow the prompts.  Office hours are 8:00 a.m. to 4:30 p.m. Monday - Friday. Please note that voicemails left after 4:00 p.m. may not be returned until the following business day.  We are closed weekends and major holidays. You have access to a nurse at all times for urgent questions. Please call the main number to the clinic Dept: [phone] and follow the prompts.   For any non-urgent questions, you may also contact your provider using MyChart. We now offer e-Visits for anyone 13 and older to request care online for non-urgent symptoms. For details visit [email or web address].   Also download the MyChart app! Go to the app store, search "MyChart", open the app, select [facility], and log in with your MyChart username and password.  Masks are optional in the cancer centers. If you would like for your care team to wear a mask while they are taking care of you, please let them know. For doctor visits, patients may have with them one support person who is at least 55 years old. At this time, visitors are not allowed in the infusion area.

## 2022-07-17 ENCOUNTER — Other Ambulatory Visit: Payer: Self-pay

## 2022-07-17 DIAGNOSIS — C9000 Multiple myeloma not having achieved remission: Secondary | ICD-10-CM

## 2022-07-17 MED FILL — Dexamethasone Sodium Phosphate Inj 100 MG/10ML: INTRAMUSCULAR | Qty: 2 | Status: AC

## 2022-07-18 ENCOUNTER — Inpatient Hospital Stay: Payer: BC Managed Care – PPO

## 2022-07-18 ENCOUNTER — Other Ambulatory Visit: Payer: Self-pay

## 2022-07-18 VITALS — BP 118/73 | HR 72 | Temp 98.3°F | Resp 17

## 2022-07-18 DIAGNOSIS — Z7189 Other specified counseling: Secondary | ICD-10-CM

## 2022-07-18 DIAGNOSIS — C9000 Multiple myeloma not having achieved remission: Secondary | ICD-10-CM

## 2022-07-18 LAB — CBC WITH DIFFERENTIAL (CANCER CENTER ONLY)
Abs Immature Granulocytes: 0.02 10*3/uL (ref 0.00–0.07)
Basophils Absolute: 0 10*3/uL (ref 0.0–0.1)
Basophils Relative: 1 %
Eosinophils Absolute: 0.1 10*3/uL (ref 0.0–0.5)
Eosinophils Relative: 1 %
HCT: 33.9 % — ABNORMAL LOW (ref 39.0–52.0)
Hemoglobin: 10.8 g/dL — ABNORMAL LOW (ref 13.0–17.0)
Immature Granulocytes: 0 %
Lymphocytes Relative: 40 %
Lymphs Abs: 3.4 10*3/uL (ref 0.7–4.0)
MCH: 25.5 pg — ABNORMAL LOW (ref 26.0–34.0)
MCHC: 31.9 g/dL (ref 30.0–36.0)
MCV: 80 fL (ref 80.0–100.0)
Monocytes Absolute: 0.9 10*3/uL (ref 0.1–1.0)
Monocytes Relative: 11 %
Neutro Abs: 4 10*3/uL (ref 1.7–7.7)
Neutrophils Relative %: 47 %
Platelet Count: 277 10*3/uL (ref 150–400)
RBC: 4.24 MIL/uL (ref 4.22–5.81)
RDW: 20.2 % — ABNORMAL HIGH (ref 11.5–15.5)
WBC Count: 8.4 10*3/uL (ref 4.0–10.5)
nRBC: 0 % (ref 0.0–0.2)

## 2022-07-18 LAB — CMP (CANCER CENTER ONLY)
ALT: 15 U/L (ref 0–44)
AST: 13 U/L — ABNORMAL LOW (ref 15–41)
Albumin: 3.6 g/dL (ref 3.5–5.0)
Alkaline Phosphatase: 142 U/L — ABNORMAL HIGH (ref 38–126)
Anion gap: 6 (ref 5–15)
BUN: 12 mg/dL (ref 6–20)
CO2: 25 mmol/L (ref 22–32)
Calcium: 8.5 mg/dL — ABNORMAL LOW (ref 8.9–10.3)
Chloride: 108 mmol/L (ref 98–111)
Creatinine: 0.67 mg/dL (ref 0.61–1.24)
GFR, Estimated: >60 mL/min (ref >60)
Glucose, Bld: 140 mg/dL — ABNORMAL HIGH (ref 70–99)
Potassium: 3.9 mmol/L (ref 3.5–5.1)
Sodium: 139 mmol/L (ref 135–145)
Total Bilirubin: 0.4 mg/dL (ref 0.3–1.2)
Total Protein: 6 g/dL — ABNORMAL LOW (ref 6.5–8.1)

## 2022-07-18 LAB — LACTATE DEHYDROGENASE: LDH: 132 U/L (ref 98–192)

## 2022-07-18 MED ORDER — BORTEZOMIB CHEMO SQ INJECTION 3.5 MG (2.5MG/ML)
1.3000 mg/m2 | Freq: Once | INTRAMUSCULAR | Status: AC
  Administered 2022-07-18: 2.5 mg via SUBCUTANEOUS
  Filled 2022-07-18: qty 1

## 2022-07-18 MED ORDER — DIPHENHYDRAMINE HCL 25 MG PO CAPS
50.0000 mg | ORAL_CAPSULE | Freq: Once | ORAL | Status: AC
  Administered 2022-07-18: 50 mg via ORAL
  Filled 2022-07-18: qty 2

## 2022-07-18 MED ORDER — ACETAMINOPHEN 500 MG PO TABS
1000.0000 mg | ORAL_TABLET | Freq: Once | ORAL | Status: AC
  Administered 2022-07-18: 1000 mg via ORAL
  Filled 2022-07-18: qty 2

## 2022-07-18 MED ORDER — SODIUM CHLORIDE 0.9 % IV SOLN: Freq: Once | INTRAVENOUS | Status: AC

## 2022-07-18 MED ORDER — SODIUM CHLORIDE 0.9 % IV SOLN
16.0000 mg/kg | Freq: Once | INTRAVENOUS | Status: AC
  Administered 2022-07-18: 1300 mg via INTRAVENOUS
  Filled 2022-07-18: qty 5

## 2022-07-18 MED ORDER — SODIUM CHLORIDE 0.9 % IV SOLN
20.0000 mg | Freq: Once | INTRAVENOUS | Status: AC
  Administered 2022-07-18: 20 mg via INTRAVENOUS
  Filled 2022-07-18: qty 20

## 2022-07-18 NOTE — Patient Instructions (Signed)
[facility] ONCOLOGY  Discharge Instructions: Thank you for choosing [facility] to provide your oncology and hematology care.   If you have a lab appointment with the [facility], please go directly to the [facility] and check in at the registration area.   Wear comfortable clothing and clothing appropriate for easy access to any Portacath or PICC line.   We strive to give you quality time with your provider. You may need to reschedule your appointment if you arrive late (15 or more minutes).  Arriving late affects you and other patients whose appointments are after yours.  Also, if you miss three or more appointments without notifying the office, you may be dismissed from the clinic at the provider's discretion.      For prescription refill requests, have your pharmacy contact our office and allow 72 hours for refills to be completed.    Today you received the following chemotherapy and/or immunotherapy agents: daratumumab, bortezomib      To help prevent nausea and vomiting after your treatment, we encourage you to take your nausea medication as directed.  BELOW ARE SYMPTOMS THAT SHOULD BE REPORTED IMMEDIATELY: *FEVER GREATER THAN 100.4 F (38 C) OR HIGHER *CHILLS OR SWEATING *NAUSEA AND VOMITING THAT IS NOT CONTROLLED WITH YOUR NAUSEA MEDICATION *UNUSUAL SHORTNESS OF BREATH *UNUSUAL BRUISING OR BLEEDING *URINARY PROBLEMS (pain or burning when urinating, or frequent urination) *BOWEL PROBLEMS (unusual diarrhea, constipation, pain near the anus) TENDERNESS IN MOUTH AND THROAT WITH OR WITHOUT PRESENCE OF ULCERS (sore throat, sores in mouth, or a toothache) UNUSUAL RASH, SWELLING OR PAIN  UNUSUAL VAGINAL DISCHARGE OR ITCHING   Items with * indicate a potential emergency and should be followed up as soon as possible or go to the Emergency Department if any problems should occur.  Please show the CHEMOTHERAPY ALERT CARD or IMMUNOTHERAPY ALERT CARD  at check-in to the Emergency Department and triage nurse.  Should you have questions after your visit or need to cancel or reschedule your appointment, please contact [facility]  Dept: [phone]  and follow the prompts.  Office hours are 8:00 a.m. to 4:30 p.m. Monday - Friday. Please note that voicemails left after 4:00 p.m. may not be returned until the following business day.  We are closed weekends and major holidays. You have access to a nurse at all times for urgent questions. Please call the main number to the clinic Dept: [phone] and follow the prompts.   For any non-urgent questions, you may also contact your provider using MyChart. We now offer e-Visits for anyone 22 and older to request care online for non-urgent symptoms. For details visit [email or web address].   Also download the MyChart app! Go to the app store, search "MyChart", open the app, select [facility], and log in with your MyChart username and password.  Masks are optional in the cancer centers. If you would like for your care team to wear a mask while they are taking care of you, please let them know. For doctor visits, patients may have with them one support person who is at least 55 years old. At this time, visitors are not allowed in the infusion area.

## 2022-07-21 ENCOUNTER — Other Ambulatory Visit: Payer: Self-pay

## 2022-07-21 ENCOUNTER — Inpatient Hospital Stay: Payer: BC Managed Care – PPO

## 2022-07-21 VITALS — BP 130/83 | HR 65 | Temp 98.1°F | Resp 20 | Ht 66.0 in | Wt 169.0 lb

## 2022-07-21 DIAGNOSIS — C9000 Multiple myeloma not having achieved remission: Secondary | ICD-10-CM | POA: Diagnosis not present

## 2022-07-21 DIAGNOSIS — Z7189 Other specified counseling: Secondary | ICD-10-CM

## 2022-07-21 MED ORDER — PROCHLORPERAZINE MALEATE 10 MG PO TABS
10.0000 mg | ORAL_TABLET | Freq: Once | ORAL | Status: AC
  Administered 2022-07-21: 10 mg via ORAL
  Filled 2022-07-21: qty 1

## 2022-07-21 MED ORDER — BORTEZOMIB CHEMO SQ INJECTION 3.5 MG (2.5MG/ML)
1.3000 mg/m2 | Freq: Once | INTRAMUSCULAR | Status: AC
  Administered 2022-07-21: 2.5 mg via SUBCUTANEOUS
  Filled 2022-07-21: qty 1

## 2022-07-21 NOTE — Patient Instructions (Signed)
[facility] ONCOLOGY  Discharge Instructions: Thank you for choosing [facility] to provide your oncology and hematology care.   If you have a lab appointment with the [facility], please go directly to the [facility] and check in at the registration area.   Wear comfortable clothing and clothing appropriate for easy access to any Portacath or PICC line.   We strive to give you quality time with your provider. You may need to reschedule your appointment if you arrive late (15 or more minutes).  Arriving late affects you and other patients whose appointments are after yours.  Also, if you miss three or more appointments without notifying the office, you may be dismissed from the clinic at the provider's discretion.      For prescription refill requests, have your pharmacy contact our office and allow 72 hours for refills to be completed.    Today you received the following chemotherapy and/or immunotherapy agent: Bortezomib (Velcade)   To help prevent nausea and vomiting after your treatment, we encourage you to take your nausea medication as directed.  BELOW ARE SYMPTOMS THAT SHOULD BE REPORTED IMMEDIATELY: *FEVER GREATER THAN 100.4 F (38 C) OR HIGHER *CHILLS OR SWEATING *NAUSEA AND VOMITING THAT IS NOT CONTROLLED WITH YOUR NAUSEA MEDICATION *UNUSUAL SHORTNESS OF BREATH *UNUSUAL BRUISING OR BLEEDING *URINARY PROBLEMS (pain or burning when urinating, or frequent urination) *BOWEL PROBLEMS (unusual diarrhea, constipation, pain near the anus) TENDERNESS IN MOUTH AND THROAT WITH OR WITHOUT PRESENCE OF ULCERS (sore throat, sores in mouth, or a toothache) UNUSUAL RASH, SWELLING OR PAIN  UNUSUAL VAGINAL DISCHARGE OR ITCHING   Items with * indicate a potential emergency and should be followed up as soon as possible or go to the Emergency Department if any problems should occur.  Please show the CHEMOTHERAPY ALERT CARD or IMMUNOTHERAPY ALERT CARD at  check-in to the Emergency Department and triage nurse.  Should you have questions after your visit or need to cancel or reschedule your appointment, please contact [facility]  Dept: [phone]  and follow the prompts.  Office hours are 8:00 a.m. to 4:30 p.m. Monday - Friday. Please note that voicemails left after 4:00 p.m. may not be returned until the following business day.  We are closed weekends and major holidays. You have access to a nurse at all times for urgent questions. Please call the main number to the clinic Dept: [phone] and follow the prompts.   For any non-urgent questions, you may also contact your provider using MyChart. We now offer e-Visits for anyone 58 and older to request care online for non-urgent symptoms. For details visit [email or web address].   Also download the MyChart app! Go to the app store, search "MyChart", open the app, select [facility], and log in with your MyChart username and password.  Masks are optional in the cancer centers. If you would like for your care team to wear a mask while they are taking care of you, please let them know. For doctor visits, patients may have with them one support person who is at least 55 years old. At this time, visitors are not allowed in the infusion area. Bortezomib injection What is this medication? BORTEZOMIB (bor TEZ oh mib) targets proteins in cancer cells and stops the cancer cells from growing. It treats multiple myeloma and mantle cell lymphoma. This medicine may be used for other purposes; ask your health care provider or pharmacist if you have questions. COMMON BRAND NAME(S):  Velcade What should I tell my care team before I take this medication? They need to know if you have any of these conditions: dehydration diabetes (high blood sugar) heart disease liver disease tingling of the fingers or toes or other nerve disorder an unusual or allergic reaction to bortezomib,  mannitol, boron, other medicines, foods, dyes, or preservatives pregnant or trying to get pregnant breast-feeding How should I use this medication? This medicine is injected into a vein or under the skin. It is given by a health care provider in a hospital or clinic setting. Talk to your health care provider about the use of this medicine in children. Special care may be needed. Overdosage: If you think you have taken too much of this medicine contact a poison control center or emergency room at once. NOTE: This medicine is only for you. Do not share this medicine with others. What if I miss a dose? Keep appointments for follow-up doses. It is important not to miss your dose. Call your health care provider if you are unable to keep an appointment. What may interact with this medication? This medicine may interact with the following medications: ketoconazole rifampin This list may not describe all possible interactions. Give your health care provider a list of all the medicines, herbs, non-prescription drugs, or dietary supplements you use. Also tell them if you smoke, drink alcohol, or use illegal drugs. Some items may interact with your medicine. What should I watch for while using this medication? Your condition will be monitored carefully while you are receiving this medicine. You may need blood work done while you are taking this medicine. You may get drowsy or dizzy. Do not drive, use machinery, or do anything that needs mental alertness until you know how this medicine affects you. Do not stand up or sit up quickly, especially if you are an older patient. This reduces the risk of dizzy or fainting spells This medicine may increase your risk of getting an infection. Call your health care provider for advice if you get a fever, chills, sore throat, or other symptoms of a cold or flu. Do not treat yourself. Try to avoid being around people who are sick. Check with your health care provider if  you have severe diarrhea, nausea, and vomiting, or if you sweat a lot. The loss of too much body fluid may make it dangerous for you to take this medicine. Do not become pregnant while taking this medicine or for 7 months after stopping it. Women should inform their health care provider if they wish to become pregnant or think they might be pregnant. Men should not father a child while taking this medicine and for 4 months after stopping it. There is a potential for serious harm to an unborn child. Talk to your health care provider for more information. Do not breast-feed an infant while taking this medicine or for 2 months after stopping it. This medicine may make it more difficult to get pregnant or father a child. Talk to your health care provider if you are concerned about your fertility. What side effects may I notice from receiving this medication? Side effects that you should report to your doctor or health care professional as soon as possible: allergic reactions (skin rash; itching or hives; swelling of the face, lips, or tongue) bleeding (bloody or black, tarry stools; red or dark brown urine; spitting up blood or brown material that looks like coffee grounds; red spots on the skin; unusual bruising or bleeding from  the eye, gums, or nose) blurred vision or changes in vision confusion constipation headache heart failure (trouble breathing; fast, irregular heartbeat; sudden weight gain; swelling of the ankles, feet, hands) infection (fever, chills, cough, sore throat, pain or trouble passing urine) lack or loss of appetite liver injury (dark yellow or brown urine; general ill feeling or flu-like symptoms; loss of appetite, right upper belly pain; yellowing of the eyes or skin) low blood pressure (dizziness; feeling faint or lightheaded, falls; unusually weak or tired) muscle cramps pain, redness, or irritation at site where injected pain, tingling, numbness in the hands or  feet seizures trouble breathing unusual bruising or bleeding Side effects that usually do not require medical attention (report to your doctor or health care professional if they continue or are bothersome): diarrhea nausea stomach pain trouble sleeping vomiting This list may not describe all possible side effects. Call your doctor for medical advice about side effects. You may report side effects to FDA at 1-800-FDA-1088. Where should I keep my medication? This medicine is given in a hospital or clinic. It will not be stored at home. NOTE: This sheet is a summary. It may not cover all possible information. If you have questions about this medicine, talk to your doctor, pharmacist, or health care provider.  2023 Elsevier/Gold Standard (2020-12-06 00:00:00)

## 2022-07-23 ENCOUNTER — Other Ambulatory Visit: Payer: Self-pay

## 2022-07-23 DIAGNOSIS — C9000 Multiple myeloma not having achieved remission: Secondary | ICD-10-CM

## 2022-07-24 MED FILL — Dexamethasone Sodium Phosphate Inj 100 MG/10ML: INTRAMUSCULAR | Qty: 2 | Status: AC

## 2022-07-25 ENCOUNTER — Other Ambulatory Visit: Payer: Self-pay

## 2022-07-25 ENCOUNTER — Inpatient Hospital Stay: Payer: BC Managed Care – PPO

## 2022-07-25 VITALS — BP 138/84 | HR 70 | Temp 97.7°F | Resp 18 | Wt 164.2 lb

## 2022-07-25 DIAGNOSIS — C9000 Multiple myeloma not having achieved remission: Secondary | ICD-10-CM

## 2022-07-25 DIAGNOSIS — Z7189 Other specified counseling: Secondary | ICD-10-CM

## 2022-07-25 LAB — CMP (CANCER CENTER ONLY)
ALT: 17 U/L (ref 0–44)
AST: 15 U/L (ref 15–41)
Albumin: 3.8 g/dL (ref 3.5–5.0)
Alkaline Phosphatase: 139 U/L — ABNORMAL HIGH (ref 38–126)
Anion gap: 6 (ref 5–15)
BUN: 12 mg/dL (ref 6–20)
CO2: 26 mmol/L (ref 22–32)
Calcium: 8.2 mg/dL — ABNORMAL LOW (ref 8.9–10.3)
Chloride: 108 mmol/L (ref 98–111)
Creatinine: 0.63 mg/dL (ref 0.61–1.24)
GFR, Estimated: >60 mL/min (ref >60)
Glucose, Bld: 151 mg/dL — ABNORMAL HIGH (ref 70–99)
Potassium: 3.8 mmol/L (ref 3.5–5.1)
Sodium: 140 mmol/L (ref 135–145)
Total Bilirubin: 0.6 mg/dL (ref 0.3–1.2)
Total Protein: 6.2 g/dL — ABNORMAL LOW (ref 6.5–8.1)

## 2022-07-25 LAB — CBC WITH DIFFERENTIAL (CANCER CENTER ONLY)
Abs Immature Granulocytes: 0.01 10*3/uL (ref 0.00–0.07)
Basophils Absolute: 0 10*3/uL (ref 0.0–0.1)
Basophils Relative: 0 %
Eosinophils Absolute: 0.4 10*3/uL (ref 0.0–0.5)
Eosinophils Relative: 8 %
HCT: 35.1 % — ABNORMAL LOW (ref 39.0–52.0)
Hemoglobin: 11.4 g/dL — ABNORMAL LOW (ref 13.0–17.0)
Immature Granulocytes: 0 %
Lymphocytes Relative: 42 %
Lymphs Abs: 2.3 10*3/uL (ref 0.7–4.0)
MCH: 25.4 pg — ABNORMAL LOW (ref 26.0–34.0)
MCHC: 32.5 g/dL (ref 30.0–36.0)
MCV: 78.2 fL — ABNORMAL LOW (ref 80.0–100.0)
Monocytes Absolute: 0.8 10*3/uL (ref 0.1–1.0)
Monocytes Relative: 15 %
Neutro Abs: 1.9 10*3/uL (ref 1.7–7.7)
Neutrophils Relative %: 35 %
Platelet Count: 88 10*3/uL — ABNORMAL LOW (ref 150–400)
RBC: 4.49 MIL/uL (ref 4.22–5.81)
RDW: 20.1 % — ABNORMAL HIGH (ref 11.5–15.5)
WBC Count: 5.5 10*3/uL (ref 4.0–10.5)
nRBC: 0.4 % — ABNORMAL HIGH (ref 0.0–0.2)

## 2022-07-25 LAB — LACTATE DEHYDROGENASE: LDH: 161 U/L (ref 98–192)

## 2022-07-25 MED ORDER — SODIUM CHLORIDE 0.9 % IV SOLN
20.0000 mg | Freq: Once | INTRAVENOUS | Status: AC
  Administered 2022-07-25: 20 mg via INTRAVENOUS
  Filled 2022-07-25: qty 20

## 2022-07-25 MED ORDER — PROCHLORPERAZINE MALEATE 10 MG PO TABS: 10.0000 mg | ORAL_TABLET | Freq: Four times a day (QID) | ORAL | 0 refills | Status: DC | PRN

## 2022-07-25 MED ORDER — DIPHENHYDRAMINE HCL 25 MG PO CAPS
50.0000 mg | ORAL_CAPSULE | Freq: Once | ORAL | Status: AC
  Administered 2022-07-25: 50 mg via ORAL
  Filled 2022-07-25: qty 2

## 2022-07-25 MED ORDER — DEXAMETHASONE 4 MG PO TABS: ORAL_TABLET | ORAL | 0 refills | Status: DC

## 2022-07-25 MED ORDER — ACETAMINOPHEN 500 MG PO TABS
1000.0000 mg | ORAL_TABLET | Freq: Once | ORAL | Status: AC
  Administered 2022-07-25: 1000 mg via ORAL
  Filled 2022-07-25: qty 2

## 2022-07-25 MED ORDER — SODIUM CHLORIDE 0.9 % IV SOLN
16.0000 mg/kg | Freq: Once | INTRAVENOUS | Status: AC
  Administered 2022-07-25: 1300 mg via INTRAVENOUS
  Filled 2022-07-25: qty 60

## 2022-07-25 MED ORDER — SODIUM CHLORIDE 0.9 % IV SOLN: Freq: Once | INTRAVENOUS | Status: AC

## 2022-07-25 MED ORDER — ONDANSETRON HCL 8 MG PO TABS: 8.0000 mg | ORAL_TABLET | Freq: Three times a day (TID) | ORAL | 0 refills | Status: DC | PRN

## 2022-07-25 MED ORDER — ACYCLOVIR 400 MG PO TABS: 400.0000 mg | ORAL_TABLET | Freq: Two times a day (BID) | ORAL | 11 refills | Status: DC

## 2022-07-25 NOTE — Progress Notes (Signed)
[facility] CSW Progress Note  Holiday representative met with patient in infusion to discuss establishing a PCP per the request of RN.  An interpreter was present.  Patient said he would like a PCP and gave CSW permission to make an appointment with the [facility]  Appointment is scheduled for Wednesday, August 2nd at 10:00am at [facility] [facility].  CSW left a vm on his phone and also mailed him the appointment information.  Informed RN of outcome.    Rodman Pickle Nel Stoneking, LCSW

## 2022-07-25 NOTE — Patient Instructions (Signed)
[facility] ONCOLOGY  Discharge Instructions: Thank you for choosing [facility] to provide your oncology and hematology care.   If you have a lab appointment with the [facility], please go directly to the [facility] and check in at the registration area.   Wear comfortable clothing and clothing appropriate for easy access to any Portacath or PICC line.   We strive to give you quality time with your provider. You may need to reschedule your appointment if you arrive late (15 or more minutes).  Arriving late affects you and other patients whose appointments are after yours.  Also, if you miss three or more appointments without notifying the office, you may be dismissed from the clinic at the provider's discretion.      For prescription refill requests, have your pharmacy contact our office and allow 72 hours for refills to be completed.    Today you received the following chemotherapy and/or immunotherapy agents: Daratumumab  To help prevent nausea and vomiting after your treatment, we encourage you to take your nausea medication as directed.  BELOW ARE SYMPTOMS THAT SHOULD BE REPORTED IMMEDIATELY: *FEVER GREATER THAN 100.4 F (38 C) OR HIGHER *CHILLS OR SWEATING *NAUSEA AND VOMITING THAT IS NOT CONTROLLED WITH YOUR NAUSEA MEDICATION *UNUSUAL SHORTNESS OF BREATH *UNUSUAL BRUISING OR BLEEDING *URINARY PROBLEMS (pain or burning when urinating, or frequent urination) *BOWEL PROBLEMS (unusual diarrhea, constipation, pain near the anus) TENDERNESS IN MOUTH AND THROAT WITH OR WITHOUT PRESENCE OF ULCERS (sore throat, sores in mouth, or a toothache) UNUSUAL RASH, SWELLING OR PAIN  UNUSUAL VAGINAL DISCHARGE OR ITCHING   Items with * indicate a potential emergency and should be followed up as soon as possible or go to the Emergency Department if any problems should occur.  Please show the CHEMOTHERAPY ALERT CARD or IMMUNOTHERAPY ALERT CARD at check-in to  the Emergency Department and triage nurse.  Should you have questions after your visit or need to cancel or reschedule your appointment, please contact [facility]  Dept: [phone]  and follow the prompts.  Office hours are 8:00 a.m. to 4:30 p.m. Monday - Friday. Please note that voicemails left after 4:00 p.m. may not be returned until the following business day.  We are closed weekends and major holidays. You have access to a nurse at all times for urgent questions. Please call the main number to the clinic Dept: [phone] and follow the prompts.   For any non-urgent questions, you may also contact your provider using MyChart. We now offer e-Visits for anyone 13 and older to request care online for non-urgent symptoms. For details visit [email or web address].   Also download the MyChart app! Go to the app store, search "MyChart", open the app, select [facility], and log in with your MyChart username and password.  Masks are optional in the cancer centers. If you would like for your care team to wear a mask while they are taking care of you, please let them know. For doctor visits, patients may have with them one support person who is at least 55 years old. At this time, visitors are not allowed in the infusion area.

## 2022-07-25 NOTE — Progress Notes (Signed)
Ok to treat today per Dr. Irene Limbo with platelets of 88.

## 2022-07-29 LAB — MULTIPLE MYELOMA PANEL, SERUM
Albumin SerPl Elph-Mcnc: 3.1 g/dL (ref 2.9–4.4)
Albumin/Glob SerPl: 1.3 (ref 0.7–1.7)
Alpha 1: 0.2 g/dL (ref 0.0–0.4)
Alpha2 Glob SerPl Elph-Mcnc: 1 g/dL (ref 0.4–1.0)
B-Globulin SerPl Elph-Mcnc: 0.9 g/dL (ref 0.7–1.3)
Gamma Glob SerPl Elph-Mcnc: 0.4 g/dL (ref 0.4–1.8)
Globulin, Total: 2.5 g/dL (ref 2.2–3.9)
IgA: 37 mg/dL — ABNORMAL LOW (ref 90–386)
IgG (Immunoglobin G), Serum: 443 mg/dL — ABNORMAL LOW (ref 603–1613)
IgM (Immunoglobulin M), Srm: 24 mg/dL (ref 20–172)
M Protein SerPl Elph-Mcnc: 0.2 g/dL — ABNORMAL HIGH
Total Protein ELP: 5.6 g/dL — ABNORMAL LOW (ref 6.0–8.5)

## 2022-07-30 ENCOUNTER — Ambulatory Visit: Payer: BC Managed Care – PPO | Admitting: Internal Medicine

## 2022-07-31 ENCOUNTER — Other Ambulatory Visit: Payer: Self-pay

## 2022-07-31 DIAGNOSIS — C9000 Multiple myeloma not having achieved remission: Secondary | ICD-10-CM

## 2022-08-01 ENCOUNTER — Inpatient Hospital Stay: Payer: BC Managed Care – PPO | Admitting: Hematology

## 2022-08-01 ENCOUNTER — Inpatient Hospital Stay: Payer: BC Managed Care – PPO

## 2022-08-01 ENCOUNTER — Other Ambulatory Visit: Payer: Self-pay

## 2022-08-01 ENCOUNTER — Inpatient Hospital Stay: Payer: BC Managed Care – PPO | Attending: Hematology

## 2022-08-01 VITALS — BP 114/74 | HR 70 | Temp 97.9°F | Resp 18 | Wt 163.5 lb

## 2022-08-01 DIAGNOSIS — G629 Polyneuropathy, unspecified: Secondary | ICD-10-CM | POA: Diagnosis not present

## 2022-08-01 DIAGNOSIS — R519 Headache, unspecified: Secondary | ICD-10-CM | POA: Diagnosis not present

## 2022-08-01 DIAGNOSIS — C9000 Multiple myeloma not having achieved remission: Secondary | ICD-10-CM

## 2022-08-01 DIAGNOSIS — D6959 Other secondary thrombocytopenia: Secondary | ICD-10-CM | POA: Diagnosis not present

## 2022-08-01 DIAGNOSIS — Z7189 Other specified counseling: Secondary | ICD-10-CM

## 2022-08-01 DIAGNOSIS — N289 Disorder of kidney and ureter, unspecified: Secondary | ICD-10-CM | POA: Insufficient documentation

## 2022-08-01 DIAGNOSIS — Z5111 Encounter for antineoplastic chemotherapy: Secondary | ICD-10-CM

## 2022-08-01 DIAGNOSIS — D649 Anemia, unspecified: Secondary | ICD-10-CM | POA: Insufficient documentation

## 2022-08-01 DIAGNOSIS — Z5112 Encounter for antineoplastic immunotherapy: Secondary | ICD-10-CM | POA: Diagnosis not present

## 2022-08-01 LAB — CBC WITH DIFFERENTIAL (CANCER CENTER ONLY)
Abs Immature Granulocytes: 0.02 10*3/uL (ref 0.00–0.07)
Basophils Absolute: 0 10*3/uL (ref 0.0–0.1)
Basophils Relative: 0 %
Eosinophils Absolute: 0.8 10*3/uL — ABNORMAL HIGH (ref 0.0–0.5)
Eosinophils Relative: 11 %
HCT: 36.8 % — ABNORMAL LOW (ref 39.0–52.0)
Hemoglobin: 11.9 g/dL — ABNORMAL LOW (ref 13.0–17.0)
Immature Granulocytes: 0 %
Lymphocytes Relative: 31 %
Lymphs Abs: 2.3 10*3/uL (ref 0.7–4.0)
MCH: 25.2 pg — ABNORMAL LOW (ref 26.0–34.0)
MCHC: 32.3 g/dL (ref 30.0–36.0)
MCV: 78 fL — ABNORMAL LOW (ref 80.0–100.0)
Monocytes Absolute: 0.7 10*3/uL (ref 0.1–1.0)
Monocytes Relative: 10 %
Neutro Abs: 3.7 10*3/uL (ref 1.7–7.7)
Neutrophils Relative %: 48 %
Platelet Count: 285 10*3/uL (ref 150–400)
RBC: 4.72 MIL/uL (ref 4.22–5.81)
RDW: 19.9 % — ABNORMAL HIGH (ref 11.5–15.5)
WBC Count: 7.7 10*3/uL (ref 4.0–10.5)
nRBC: 0 % (ref 0.0–0.2)

## 2022-08-01 LAB — LACTATE DEHYDROGENASE: LDH: 147 U/L (ref 98–192)

## 2022-08-01 LAB — CMP (CANCER CENTER ONLY)
ALT: 17 U/L (ref 0–44)
AST: 13 U/L — ABNORMAL LOW (ref 15–41)
Albumin: 4 g/dL (ref 3.5–5.0)
Alkaline Phosphatase: 126 U/L (ref 38–126)
Anion gap: 8 (ref 5–15)
BUN: 9 mg/dL (ref 6–20)
CO2: 27 mmol/L (ref 22–32)
Calcium: 9.1 mg/dL (ref 8.9–10.3)
Chloride: 104 mmol/L (ref 98–111)
Creatinine: 0.77 mg/dL (ref 0.61–1.24)
GFR, Estimated: >60 mL/min (ref >60)
Glucose, Bld: 169 mg/dL — ABNORMAL HIGH (ref 70–99)
Potassium: 3.5 mmol/L (ref 3.5–5.1)
Sodium: 139 mmol/L (ref 135–145)
Total Bilirubin: 0.6 mg/dL (ref 0.3–1.2)
Total Protein: 6.6 g/dL (ref 6.5–8.1)

## 2022-08-01 MED ORDER — SODIUM CHLORIDE 0.9 % IV SOLN
20.0000 mg | Freq: Once | INTRAVENOUS | Status: AC
  Administered 2022-08-01: 20 mg via INTRAVENOUS
  Filled 2022-08-01: qty 20

## 2022-08-01 MED ORDER — BORTEZOMIB CHEMO SQ INJECTION 3.5 MG (2.5MG/ML)
1.3000 mg/m2 | Freq: Once | INTRAMUSCULAR | Status: DC
  Filled 2022-08-01: qty 1

## 2022-08-01 MED ORDER — DIPHENHYDRAMINE HCL 25 MG PO CAPS
50.0000 mg | ORAL_CAPSULE | Freq: Once | ORAL | Status: AC
  Administered 2022-08-01: 50 mg via ORAL
  Filled 2022-08-01: qty 2

## 2022-08-01 MED ORDER — ACETAMINOPHEN 500 MG PO TABS
1000.0000 mg | ORAL_TABLET | Freq: Once | ORAL | Status: AC
  Administered 2022-08-01: 1000 mg via ORAL
  Filled 2022-08-01: qty 2

## 2022-08-01 MED ORDER — SODIUM CHLORIDE 0.9 % IV SOLN: Freq: Once | INTRAVENOUS | Status: AC

## 2022-08-01 MED ORDER — SODIUM CHLORIDE 0.9 % IV SOLN
16.0000 mg/kg | Freq: Once | INTRAVENOUS | Status: AC
  Administered 2022-08-01: 1300 mg via INTRAVENOUS
  Filled 2022-08-01: qty 60

## 2022-08-01 NOTE — Patient Instructions (Signed)
[facility] ONCOLOGY  Discharge Instructions: Thank you for choosing [facility] to provide your oncology and hematology care.   If you have a lab appointment with the [facility], please go directly to the [facility] and check in at the registration area.   Wear comfortable clothing and clothing appropriate for easy access to any Portacath or PICC line.   We strive to give you quality time with your provider. You may need to reschedule your appointment if you arrive late (15 or more minutes).  Arriving late affects you and other patients whose appointments are after yours.  Also, if you miss three or more appointments without notifying the office, you may be dismissed from the clinic at the provider's discretion.      For prescription refill requests, have your pharmacy contact our office and allow 72 hours for refills to be completed.    Today you received the following chemotherapy and/or immunotherapy agents: Daratumumab  To help prevent nausea and vomiting after your treatment, we encourage you to take your nausea medication as directed.  BELOW ARE SYMPTOMS THAT SHOULD BE REPORTED IMMEDIATELY: *FEVER GREATER THAN 100.4 F (38 C) OR HIGHER *CHILLS OR SWEATING *NAUSEA AND VOMITING THAT IS NOT CONTROLLED WITH YOUR NAUSEA MEDICATION *UNUSUAL SHORTNESS OF BREATH *UNUSUAL BRUISING OR BLEEDING *URINARY PROBLEMS (pain or burning when urinating, or frequent urination) *BOWEL PROBLEMS (unusual diarrhea, constipation, pain near the anus) TENDERNESS IN MOUTH AND THROAT WITH OR WITHOUT PRESENCE OF ULCERS (sore throat, sores in mouth, or a toothache) UNUSUAL RASH, SWELLING OR PAIN  UNUSUAL VAGINAL DISCHARGE OR ITCHING   Items with * indicate a potential emergency and should be followed up as soon as possible or go to the Emergency Department if any problems should occur.  Please show the CHEMOTHERAPY ALERT CARD or IMMUNOTHERAPY ALERT CARD at check-in to  the Emergency Department and triage nurse.  Should you have questions after your visit or need to cancel or reschedule your appointment, please contact [facility]  Dept: [phone]  and follow the prompts.  Office hours are 8:00 a.m. to 4:30 p.m. Monday - Friday. Please note that voicemails left after 4:00 p.m. may not be returned until the following business day.  We are closed weekends and major holidays. You have access to a nurse at all times for urgent questions. Please call the main number to the clinic Dept: [phone] and follow the prompts.   For any non-urgent questions, you may also contact your provider using MyChart. We now offer e-Visits for anyone 55 and older to request care online for non-urgent symptoms. For details visit [email or web address].   Also download the MyChart app! Go to the app store, search "MyChart", open the app, select [facility], and log in with your MyChart username and password.  Masks are optional in the cancer centers. If you would like for your care team to wear a mask while they are taking care of you, please let them know. For doctor visits, patients may have with them one support person who is at least 55 years old. At this time, visitors are not allowed in the infusion area.

## 2022-08-04 ENCOUNTER — Encounter (HOSPITAL_COMMUNITY): Payer: Self-pay | Admitting: Dentistry

## 2022-08-04 ENCOUNTER — Other Ambulatory Visit: Payer: Self-pay | Admitting: Hematology

## 2022-08-04 ENCOUNTER — Inpatient Hospital Stay: Payer: BC Managed Care – PPO

## 2022-08-04 ENCOUNTER — Ambulatory Visit (INDEPENDENT_AMBULATORY_CARE_PROVIDER_SITE_OTHER): Payer: BC Managed Care – PPO | Admitting: Dentistry

## 2022-08-04 VITALS — BP 131/76 | HR 70 | Temp 98.3°F

## 2022-08-04 DIAGNOSIS — K036 Deposits [accretions] on teeth: Secondary | ICD-10-CM

## 2022-08-04 DIAGNOSIS — M264 Malocclusion, unspecified: Secondary | ICD-10-CM

## 2022-08-04 DIAGNOSIS — K030 Excessive attrition of teeth: Secondary | ICD-10-CM

## 2022-08-04 DIAGNOSIS — C9000 Multiple myeloma not having achieved remission: Secondary | ICD-10-CM

## 2022-08-04 DIAGNOSIS — K032 Erosion of teeth: Secondary | ICD-10-CM

## 2022-08-04 DIAGNOSIS — K08109 Complete loss of teeth, unspecified cause, unspecified class: Secondary | ICD-10-CM

## 2022-08-04 DIAGNOSIS — K029 Dental caries, unspecified: Secondary | ICD-10-CM

## 2022-08-04 DIAGNOSIS — Z01818 Encounter for other preprocedural examination: Secondary | ICD-10-CM

## 2022-08-04 DIAGNOSIS — K0889 Other specified disorders of teeth and supporting structures: Secondary | ICD-10-CM

## 2022-08-04 DIAGNOSIS — K045 Chronic apical periodontitis: Secondary | ICD-10-CM

## 2022-08-04 DIAGNOSIS — K056 Periodontal disease, unspecified: Secondary | ICD-10-CM

## 2022-08-04 DIAGNOSIS — K0850 Unsatisfactory restoration of tooth, unspecified: Secondary | ICD-10-CM

## 2022-08-04 NOTE — Patient Instructions (Signed)
[facility] DEPARTMENT OF DENTAL MEDICINE Dr. Debe Coder B. Benson [place], D.M.D. Phone: [phone] Fax: [phone]       It was a pleasure seeing you today!  Please refer to the information below regarding your dental visit with [name].  Please do not hesitate to give [name] a call if any questions or concerns come up after you leave.    Thank you for letting us provide care for you.  If there is anything we can do for you, please let us know.    IV BISPHOSPHONATE (ZOMETA) THERAPY AND INFORMATION REGARDING YOUR TEETH        Before you start on your new therapy, there are a few things you should know: Bisphosphonate drugs appear to adversely affect the ability of the jaw bones to break down or remodel themselves, therefore reducing or eliminating their ordinary healing capacity and the ability to maintain normal health. This risk is increased after surgery, such as extractions, implant placement or other "invasive" procedures that might cause only mild trauma to the bone, or surgery on your gums, there is a significant risk of severe complications.  One of these complications includes necrosis or exposure of the bone (osteonecrosis) and subsequent soft tissue and/or bone infection may result. Once osteonecrosis begins, this is a long-term, destructive process in the jawbone that is very difficult or impossible to eliminate.    Each of these things are some of the reasons why it is so important to see a dentist before starting your therapy.  It is important to address all of your oral health needs such as taking out any bad or infected teeth with large cavities, discussing the health of your gums and taking out any teeth that are loose or may become infected in the future, and recognizing the importance of visiting the dentist every 4-6 months after starting therapy to make sure you are maintaining optimal oral health.    Questions?  Call our office during office hours at  [phone].

## 2022-08-04 NOTE — Progress Notes (Signed)
[facility] Dental [facility]     OUTPATIENT CONSULTATION PLAN/RECOMMENDATIONS     ASSESSMENT: There are no current signs of acute odontogenic infection including abscess, edema or erythema, or suspicious lesion requiring biopsy.   Caries; periodontal concerns that include severe bone loss, loose teeth, heavy accretions on teeth and inflamed, erythematous gingival tissue.     RECOMMENDATIONS: Extractions of all indicated teeth to decrease the risk of perioperative and postoperative systemic infection and complications.   Due to the patient's severe periodontal disease and amount of calculus build-up, it is not possible to complete a thorough exam without first completing a full mouth debridement.       PLAN: Discuss case with medical team and coordinate treatment as needed. Return for Full Mouth Debridement and then follow-up appointment to treatment plan extractions.  Discussed in detail all treatment options and recommendations with the patient and they are agreeable to the plan.   Thank you for consulting with [facility] and for the opportunity to participate in this patient's treatment.  Should you have any questions or concerns, please contact the [facility] at [phone].    Service Date:   08/04/2022  Patient Name:   Donald Suarez Date of Birth:   01/30/1966 Medical Record Number: [id number]  Referring Provider:            Sullivan Lone, MD   HISTORY OF PRESENT ILLNESS: Donald Suarez is a very pleasant 56 y.o. male with history of thrombocytopenia, T2DM and anemia who was recently diagnosed with multiple myeloma not having achieved remission and is anticipating Zometa therapy. The patient presents today for a medically necessary dental consultation as part of their pre- IV bisphosphonate therapy (pre- Zometa) work-up.   DENTAL HISTORY: The patient reports that his last dental visit was in 2022 for a dental cleaning in [place]. He  states that he will not be returning to [facility] where he used to go to the dentist.  He currently denies any dental/orofacial pain or sensitivity. Patient is able to manage oral secretions.  Patient denies dysphagia, odynophagia, dysphonia, SOB and neck pain.  Patient denies fever, rigors and malaise.   CHIEF COMPLAINT:  Here for a pre- Zometa therapy dental evaluation.  Patient Active Problem List   Diagnosis Date Noted   New onset type 2 diabetes mellitus ([place]) 05/16/2022   Hyperviscosity    Symptomatic anemia 05/13/2022   Hyponatremia 05/13/2022   Hyperglycemia 05/13/2022   Thrombocytopenia ([place]) 05/13/2022   Hypoalbuminemia 05/13/2022   Multiple myeloma not having achieved remission ([place]) 01/01/2021   Counseling regarding advance care planning and goals of care 01/01/2021   Past Medical History:  Diagnosis Date   Anemia    Past Surgical History:  Procedure Laterality Date   APPENDECTOMY     Jahvier FLUORO GUIDE CV LINE RIGHT  05/15/2022   Izzy PATIENT EVAL TECH 0-60 MINS  05/19/2022   Lucille US GUIDE VASC ACCESS RIGHT  05/15/2022   No Known Allergies Current Outpatient Medications  Medication Sig Dispense Refill   Accu-Chek Softclix Lancets lancets Use to test blood sugars up to 4 times daily as directed. 100 each 0   acyclovir (ZOVIRAX) 400 MG tablet Take 1 tablet by mouth 2 times daily. 60 tablet 11   aspirin EC 81 MG tablet Take 81 mg by mouth daily. Swallow whole.     Blood Glucose Monitoring Suppl (ACCU-CHEK GUIDE) w/Device KIT Use to test blood sugars up to 4 times daily as directed. 1 kit  0   dexamethasone (DECADRON) 4 MG tablet Take 3 tablets with breakfast the morning after each Daratumumab treatment 30 tablet 0   glucose blood (ACCU-CHEK GUIDE) test strip Use to test blood sugars up to 4 times daily as directed. 100 each 0   insulin aspart protamine - aspart (NOVOLOG 70/30 MIX) (70-30) 100 UNIT/ML FlexPen Inject 10 Units into the skin 2 (two) times daily. 6 mL 2   lenalidomide  (REVLIMID) 15 MG capsule TAKE 1 CAPSULE BY MOUTH 1 TIME A DAY FOR 21 DAYS ON THEN 7 DAYS OFF 21 capsule 0   ondansetron (ZOFRAN) 8 MG tablet Take 1 tablet by mouth every 8 hours as needed for nausea or vomiting. 30 tablet 0   prochlorperazine (COMPAZINE) 10 MG tablet Take 1 tablet by mouth every 6 hours as needed for nausea or vomiting. 30 tablet 0   zolpidem (AMBIEN) 10 MG tablet Take 10 mg by mouth at bedtime as needed for sleep.     No current facility-administered medications for this visit.    LABS:  Lab Results  Component Value Date   WBC 7.7 08/01/2022   HGB 11.9 (L) 08/01/2022   HCT 36.8 (L) 08/01/2022   MCV 78.0 (L) 08/01/2022   PLT 285 08/01/2022      Component Value Date/Time   NA 139 08/01/2022 0957   K 3.5 08/01/2022 0957   CL 104 08/01/2022 0957   CO2 27 08/01/2022 0957   GLUCOSE 169 (H) 08/01/2022 0957   BUN 9 08/01/2022 0957   CREATININE 0.77 08/01/2022 0957   CALCIUM 9.1 08/01/2022 0957   GFRNONAA >60 08/01/2022 0957   No results found for: "INR", "PROTIME" No results found for: "PTT"  Social History   Socioeconomic History   Marital status: Married    Spouse name: Not on file   Number of children: Not on file   Years of education: Not on file   Highest education level: Not on file  Occupational History   Not on file  Tobacco Use   Smoking status: Never   Smokeless tobacco: Never  Vaping Use   Vaping Use: Never used  Substance and Sexual Activity   Alcohol use: Never   Drug use: Never   Sexual activity: Not on file  Other Topics Concern   Not on file  Social History Narrative   Not on file   Social Determinants of Health   Financial Resource Strain: Medium Risk (05/21/2022)   Overall Financial Resource Strain (CARDIA)    Difficulty of Paying Living Expenses: Somewhat hard  Food Insecurity: Food Insecurity Present (05/21/2022)   Hunger Vital Sign    Worried About Running Out of Food in the Last Year: Sometimes true    Ran Out of Food in  the Last Year: Sometimes true  Transportation Needs: No Transportation Needs (05/21/2022)   PRAPARE - Hydrologist (Medical): No    Lack of Transportation (Non-Medical): No  Physical Activity: Not on file  Stress: Not on file  Social Connections: Not on file  Intimate Partner Violence: Not on file   No family history on file.   REVIEW OF SYSTEMS:  Reviewed with the patient as per HPI. PSYCH:  Patient denies having dental phobia.   VITAL SIGNS: BP 131/76 (BP Location: Right Arm, Patient Position: Sitting, Cuff Size: Normal)   Pulse 70   Temp 98.3 F (36.8 C) (Oral)    PHYSICAL EXAM: GENERAL:  Well-developed, comfortable and in no apparent distress. NEUROLOGICAL:  Alert and oriented to person, place and  time. EXTRAORAL:  Facial symmetry present without any edema or erythema.  No swelling or lymphadenopathy.  TMJ asymptomatic without crepitations.  [+] TMJ:  Bilateral clicking/popping upon opening INTRAORAL:  Soft tissues appear well-perfused and mucous membranes moist.  FOM and vestibules soft and not raised. Oral cavity without mass or lesion. No signs of infection, parulis, sinus tract, edema or erythema evident upon exam.   DENTAL EXAM: Hard tissue exam completed and charted.    OVERALL IMPRESSION:  Poor remaining dentition.       ORAL HYGIENE:  Poor    PERIODONTAL:  Inflamed and erythematous gingival tissue.   Generalized heavy plaque and calculus accumulation. [+] Mobility:  Class I- #26;  Class II- #23, #24;  Class III- #17 CARIES:  #3, #4, #5, #7, #14, #20, #27, #28, #30, #31 DEFECTIVE RESTORATIONS:   #28 existing resin restoration with missing portion of filling ENDODONTICS:   #19 previous RCT without definitive restoration/crown PROSTHODONTICS:  #8 is an implant-crown OCCLUSION:  Class 2 Non-functional teeth:  #15, #17, #31 OTHER FINDINGS:   [+] Attrition/wear:  #6I, #9I, #10I, #24I, #25I [+] Abfraction(s):  #4B(V), #5B(V),  #12B(V), #20B(V), #21B(V), #28B(V), #29B(V)   RADIOGRAPHIC EXAM:  PAN and Full Mouth Series were exposed and interpreted.  Condyles seated bilaterally in fossas.  No evidence of abnormal pathology.  All visualized osseous structures appear WNL.  Missing teeth #'s 1, 2, 8, 16, 18 & 32.  #17 has periapical radiolucency.   Localized severe horizontal bone loss consistent with severe periodontitis. Significant radiographic calculus evident.  Teeth #'s 3, 30 & 31 show signs of bone loss with furcation involvement.     Existing restorations on #19, #28 & #29; #19 has had previous endodontic therapy w/ adequate gutta percha fill ~1 mm short of apices.  #8 is an implant-crown.  Caries-  #25M&D, #37M&D, #5D, #7D, #137M, #20D, #78M, #28D, #30D & #18M.     ASSESSMENT:  1.  Multiple myeloma not having achieved remission 2.  Pre-Zometa dental examination 3.  Accretions on teeth 4.  Missing teeth 5.  Periodontal disease 6.  Caries 7.  Chronic apical periodontitis 8.  Loose teeth 9.  Abfraction/flexure 10.  Attrition/wear 11.  Defective restorations 12.  Malocclusion   PLAN AND RECOMMENDATIONS: I discussed the risks, benefits, and complications of various scenarios with the patient in relationship to their medical and dental conditions, which included systemic infection such as endocarditis, bacteremia or other serious issues such as osteonecrosis of the jaw due to drug (MRONJ) that could potentially occur either during or after their anticipated Zometa therapy if dental/oral concerns are not addressed.  I explained that if any chronic or acute dental/oral infection(s) are addressed and subsequently not maintained following medical optimization and recovery, their risk of the previously mentioned complications are just as high and could potentially occur post-therapy.  I explained all significant findings of the dental consultation with the patient including significant calculus or tartar  accumulation, bone loss and loose teeth due to periodontal or gum disease as well as several teeth with cavities, and the recommended care including full mouth debridement or removing all of the heavy tartar off of his teeth prior to finalizing the treatment plan in terms of extractions before starting Zometa.  I explained that we cannot determine which teeth need to come out or are too infected by gum disease without first cleaning all of the tartar off of his teeth and allowing his gums  to heal and then reevaluating his dentition.  I did explain that there are at least 3 teeth that will need to come out and potentially more.  The patient verbalized understanding of all findings, discussion, and recommendations. We then discussed various treatment options to include no treatment, multiple extractions with alveoloplasty, pre-prosthetic surgery as indicated, periodontal therapy, dental restorations, root canal therapy, crown and bridge therapy, implant therapy, and replacement of missing teeth as indicated.  The patient verbalized understanding of all options, and currently wishes to proceed with recommended Full Mouth Debridement and then extractions of all indicated teeth in order to prepare him for Zometa therapy.  Plan to discuss all findings and recommendations with medical team and coordinate future care as needed.   Return for full mouth debridement and then follow-up (about 2 weeks later) to finalize treatment plan regarding extractions before clearing the patient for Zometa. Call if any questions or concerns arise before next visit.  All questions and concerns were invited and addressed.  The patient tolerated today's visit well and departed in stable condition.  I spent in excess of 120 minutes during the conduct of this consultation and >50% of this time involved direct face-to-face encounter for counseling and/or coordination of the patient's care.  Sandi Mariscal, DMD

## 2022-08-06 DIAGNOSIS — K029 Dental caries, unspecified: Secondary | ICD-10-CM | POA: Insufficient documentation

## 2022-08-06 DIAGNOSIS — M264 Malocclusion, unspecified: Secondary | ICD-10-CM

## 2022-08-06 DIAGNOSIS — K0889 Other specified disorders of teeth and supporting structures: Secondary | ICD-10-CM

## 2022-08-06 DIAGNOSIS — K0850 Unsatisfactory restoration of tooth, unspecified: Secondary | ICD-10-CM | POA: Insufficient documentation

## 2022-08-06 DIAGNOSIS — K036 Deposits [accretions] on teeth: Secondary | ICD-10-CM | POA: Insufficient documentation

## 2022-08-06 DIAGNOSIS — K056 Periodontal disease, unspecified: Secondary | ICD-10-CM | POA: Insufficient documentation

## 2022-08-06 DIAGNOSIS — K032 Erosion of teeth: Secondary | ICD-10-CM | POA: Insufficient documentation

## 2022-08-06 DIAGNOSIS — K08109 Complete loss of teeth, unspecified cause, unspecified class: Secondary | ICD-10-CM

## 2022-08-06 DIAGNOSIS — K030 Excessive attrition of teeth: Secondary | ICD-10-CM

## 2022-08-06 DIAGNOSIS — K045 Chronic apical periodontitis: Secondary | ICD-10-CM | POA: Insufficient documentation

## 2022-08-06 DIAGNOSIS — Z01818 Encounter for other preprocedural examination: Secondary | ICD-10-CM | POA: Insufficient documentation

## 2022-08-06 DIAGNOSIS — K031 Abrasion of teeth: Secondary | ICD-10-CM

## 2022-08-06 HISTORY — DX: Complete loss of teeth, unspecified cause, unspecified class: K08.109

## 2022-08-06 HISTORY — DX: Other specified disorders of teeth and supporting structures: K08.89

## 2022-08-06 HISTORY — DX: Dental caries, unspecified: K02.9

## 2022-08-06 HISTORY — DX: Deposits (accretions) on teeth: K03.6

## 2022-08-06 HISTORY — DX: Periodontal disease, unspecified: K05.6

## 2022-08-06 HISTORY — DX: Malocclusion, unspecified: M26.4

## 2022-08-06 HISTORY — DX: Unsatisfactory restoration of tooth, unspecified: K08.50

## 2022-08-06 HISTORY — DX: Erosion of teeth: K03.2

## 2022-08-06 HISTORY — DX: Excessive attrition of teeth: K03.0

## 2022-08-06 HISTORY — DX: Abrasion of teeth: K03.1

## 2022-08-07 ENCOUNTER — Ambulatory Visit: Payer: BC Managed Care – PPO | Admitting: Internal Medicine

## 2022-08-07 ENCOUNTER — Encounter: Payer: Self-pay | Admitting: Internal Medicine

## 2022-08-07 VITALS — BP 130/80 | HR 58 | Ht 66.0 in

## 2022-08-07 DIAGNOSIS — R7989 Other specified abnormal findings of blood chemistry: Secondary | ICD-10-CM | POA: Diagnosis not present

## 2022-08-07 DIAGNOSIS — H538 Other visual disturbances: Secondary | ICD-10-CM | POA: Diagnosis not present

## 2022-08-07 DIAGNOSIS — Z794 Long term (current) use of insulin: Secondary | ICD-10-CM

## 2022-08-07 DIAGNOSIS — E1165 Type 2 diabetes mellitus with hyperglycemia: Secondary | ICD-10-CM

## 2022-08-07 HISTORY — DX: Other specified abnormal findings of blood chemistry: R79.89

## 2022-08-07 MED ORDER — FREESTYLE LIBRE 3 SENSOR MISC: 1.0000 | 11 refills | Status: DC

## 2022-08-07 MED FILL — Dexamethasone Sodium Phosphate Inj 100 MG/10ML: INTRAMUSCULAR | Qty: 2 | Status: AC

## 2022-08-07 NOTE — Patient Instructions (Addendum)
Please complete requested labwork in 1 week. Then lets follow up in 2 weeks to go over it and adjust medications for the diabetes.  We need to confirm which type of diabetes.   I will send sugar meter to pharmacy please check sugar by downloading app and try to minimize carbohydrate intake in the meantime.   You will be called for eye check

## 2022-08-07 NOTE — Assessment & Plan Note (Addendum)
Not clear if type 2 or 1, he has been treated with insulin but I cant find the autoantibody studies.   Asked to bring in all home meds so we can reconcile in 2-4 weeks, and go over labs, and adjust meds.  Lab Results  Component Value Date   HGBA1C 8.2 (H) 05/13/2022   Diabetic Foot Exam - Simple   Simple Foot Form Diabetic Foot exam was performed with the following findings: Yes 08/07/2022 11:53 AM  Visual Inspection No deformities, no ulcerations, no other skin breakdown bilaterally: Yes Sensation Testing Intact to touch and monofilament testing bilaterally: Yes Pulse Check Posterior Tibialis and Dorsalis pulse intact bilaterally: Yes Comments   had to wait til next week for labs since a1c checked less than 90days ago

## 2022-08-07 NOTE — Progress Notes (Signed)
Phone: [phone]  New patient visit  Visit Date: 08/07/2022 Patient: Donald Suarez   DOB: 05/02/1966   56 y.o. Male  MRN: [id number]   Assessment and Plan:   Mr. Tanimoto is a very pleasant 56 y.o. male with history of thrombocytopenia, T2DM, anemia who was diagnosed with multiple myeloma not having achieved remission presenting to establish primary care at behest of his oncology team, due in part to diagnosis of type 2 diabetes.  He was to receive zometa iv bisphosphonate.  History is taken via interpreter, as he speaks indigenous [place] language [place]  Its not clear if type 2 diabetes is accurate diagnosis.   He has been taking only insulin until it ran out, as if it was a type 1 diabetes diagnosis.  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  Chi was seen today for establish care.  Type 2 diabetes mellitus with hyperglycemia, with long-term current use of insulin (HCC) Overview: Was diagnosed @ [place] 2023 Was on insulin pump but it ran out - so off all meds.  Assessment & Plan: Not clear if type 2 or 1, he has been treated with insulin but I cant find the autoantibody studies.   Asked to bring in all home meds so we can reconcile in 2-4 weeks, and go over labs, and adjust meds.  Lab Results  Component Value Date   HGBA1C 8.2 (H) 05/13/2022   Diabetic Foot Exam - Simple   Simple Foot Form Diabetic Foot exam was performed with the following findings: Yes 08/07/2022 11:53 AM  Visual Inspection No deformities, no ulcerations, no other skin breakdown bilaterally: Yes Sensation Testing Intact to touch and monofilament testing bilaterally: Yes Pulse Check Posterior Tibialis and Dorsalis pulse intact bilaterally: Yes Comments   had to wait til next week for labs  since a1c checked less than 90days ago  Orders: -     Ambulatory referral to Ophthalmology -     CBC; Future -     Comprehensive metabolic panel; Future -     Lipid panel; Future -     Hemoglobin A1c; Future -     Glutamic acid decarboxylase auto abs -     Anti-islet cell antibody -     Microalbumin / creatinine urine ratio; Future  Hypomagnesemia -     Magnesium  Blurry vision -     Ambulatory referral to Ophthalmology  Elevated ferritin -     Ferritin; Future  Other orders -     FreeStyle Libre 3 Sensor; 1 Application by Does not apply route every 14 (fourteen) days. Place 1 sensor on the skin every 14 days. Use to check glucose continuously  Dispense: 2 each; Refill: 11      Health Maintenance  Topic Date Due   COVID-19 Vaccine (1) Never done   OPHTHALMOLOGY EXAM  Never done   URINE MICROALBUMIN  Never done   Hepatitis C Screening  Never done   Zoster Vaccines- Shingrix (1 of 2) Never done   COLONOSCOPY (Pts 45-34yr Insurance coverage will need to be confirmed)  Never done   INFLUENZA VACCINE  07/29/2022   HEMOGLOBIN A1C  11/13/2022   FOOT EXAM  08/08/2023   TETANUS/TDAP  07/30/2031   HIV Screening  Completed   HPV VACCINES  Aged Out  Recommended follow up: 2 weeks- review labs and sugars and restart diabetes meds No follow-ups on file. Future Appointments  Date Time Provider [place]  08/08/2022 10:30 AM [facility] LAB [facility] None  08/08/2022 11:30 AM [facility] INFUSION [facility] None  08/11/2022  9:30 AM [facility] INFUSION [facility] None  08/12/2022 10:00 AM [facility] LAB [facility] PEC  08/15/2022 10:30 AM [facility] LAB [facility] None  08/15/2022 11:30 AM [facility] INFUSION [facility] None  08/19/2022  9:00 AM Loralee Pacas, MD [facility] PEC  09/05/2022 10:30 AM [facility] INFUSION [facility] None  09/17/2022  9:30 AM Owsley, Debe Coder B, DMD [facility] None  10/01/2022  9:30 AM Owsley, Madison B, DMD [facility] None  10/03/2022 10:30  AM [facility] INFUSION [facility] None  10/23/2022  3:00 PM Dorna Mai, MD [facility] None  10/31/2022 10:30 AM [facility] INFUSION [facility] None       Subjective:  Patient presents today to establish care.  Has been following with onc for mm who wanted him to have pcp for dm mgmt. Chief Complaint  Patient presents with   Establish Care    Pt here to establish care with a PCP. Concerns are to check overall general health for chemo treatment (goes once a week)    For history taking, I took a per problem history from the patient and chart review as follows: Problem  Elevated Ferritin  Type 2 Diabetes Mellitus With Hyperglycemia (Hcc)   Was diagnosed @ [facility] 2023 Was on insulin pump but it ran out - so off all meds.   Hyperproteinemia  Blurry Vision  Hypomagnesemia  Encounter for Preoperative Dental Examination (Resolved)  Hyperglycemia (Resolved)     Depression Screen     No data to display         No results found for any visits on 08/07/22.   The following were reviewed and entered/updated in epic: Past Medical History:  Diagnosis Date   Anemia    Elevated ferritin 08/07/2022   Past Surgical History:  Procedure Laterality Date   APPENDECTOMY     Deanthony FLUORO GUIDE CV LINE RIGHT  05/15/2022   Khy PATIENT EVAL TECH 0-60 MINS  05/19/2022   Avary US GUIDE VASC ACCESS RIGHT  05/15/2022   Past Surgical History:  Procedure Laterality Date   APPENDECTOMY     Ashe FLUORO GUIDE CV LINE RIGHT  05/15/2022   Hercules PATIENT EVAL TECH 0-60 MINS  05/19/2022   Jezreel US GUIDE VASC ACCESS RIGHT  05/15/2022   Family Status  Relation Name Status   Mother  Deceased   Father  Deceased   No family history on file.  he did not bring meds and didn't remember all so was not possible to reconcile accurately Current Outpatient Medications  Medication Sig Dispense Refill   Accu-Chek Softclix Lancets lancets Use to test blood sugars up to 4 times daily as directed. 100 each 0   acyclovir  (ZOVIRAX) 400 MG tablet Take 1 tablet by mouth 2 times daily. 60 tablet 11   aspirin EC 81 MG tablet Take 81 mg by mouth daily. Swallow whole.     Blood Glucose Monitoring Suppl (ACCU-CHEK GUIDE) w/Device KIT Use to test blood sugars up to 4 times daily as directed. 1 kit 0   Continuous Blood Gluc Sensor (FREESTYLE LIBRE 3 SENSOR) MISC 1 Application by Does not apply route every 14 (fourteen) days. Place 1 sensor on the skin every 14 days. Use to check glucose continuously 2 each 11   dexamethasone (DECADRON) 4 MG tablet  Take 3 tablets with breakfast the morning after each Daratumumab treatment 30 tablet 0   glucose blood (ACCU-CHEK GUIDE) test strip Use to test blood sugars up to 4 times daily as directed. 100 each 0   insulin aspart protamine - aspart (NOVOLOG 70/30 MIX) (70-30) 100 UNIT/ML FlexPen Inject 10 Units into the skin 2 (two) times daily. 6 mL 2   lenalidomide (REVLIMID) 15 MG capsule TAKE 1 CAPSULE BY MOUTH 1 TIME A DAY FOR 21 DAYS ON THEN 7 DAYS OFF 21 capsule 0   ondansetron (ZOFRAN) 8 MG tablet Take 1 tablet by mouth every 8 hours as needed for nausea or vomiting. 30 tablet 0   prochlorperazine (COMPAZINE) 10 MG tablet Take 1 tablet by mouth every 6 hours as needed for nausea or vomiting. 30 tablet 0   zolpidem (AMBIEN) 10 MG tablet Take 10 mg by mouth at bedtime as needed for sleep.     No current facility-administered medications for this visit.    Allergies-reviewed and updated No Known Allergies  Social History   Tobacco Use   Smoking status: Never   Smokeless tobacco: Never  Vaping Use   Vaping Use: Never used  Substance Use Topics   Alcohol use: Never   Drug use: Never     Immunization History  Administered Date(s) Administered   Td 07/29/2021      Objective:  BP 130/80   Pulse (!) 58   Ht 5' 6"  (1.676 m)   SpO2 99%   BMI 26.39 kg/m   This is a very cordial and polite person who was a pleasure to meet.  Gen: NAD, resting comfortably,  HEENT: Mucous  membranes are moist. Sclera conjunctiva and lids grossly normal Neck: no thyromegaly, no cervical lymphadenopathy CV: RRR no murmurs rubs or gallops Lungs: CTAB no crackles, wheeze, rhonchiExt: no edema Skin: warm, dry Neuro: grossly intact Diabetic Foot Exam - Simple   Simple Foot Form Diabetic Foot exam was performed with the following findings: Yes 08/07/2022 11:53 AM  Visual Inspection No deformities, no ulcerations, no other skin breakdown bilaterally: Yes Sensation Testing Intact to touch and monofilament testing bilaterally: Yes Pulse Check Posterior Tibialis and Dorsalis pulse intact bilaterally: Yes Comments        Today's healthcare provider: Loralee Pacas, MD

## 2022-08-08 ENCOUNTER — Other Ambulatory Visit: Payer: Self-pay | Admitting: *Deleted

## 2022-08-08 ENCOUNTER — Inpatient Hospital Stay: Payer: BC Managed Care – PPO

## 2022-08-08 ENCOUNTER — Encounter: Payer: Self-pay | Admitting: Hematology

## 2022-08-08 ENCOUNTER — Other Ambulatory Visit: Payer: Self-pay

## 2022-08-08 VITALS — BP 126/83 | HR 68 | Temp 98.0°F | Resp 18 | Wt 163.8 lb

## 2022-08-08 DIAGNOSIS — Z7189 Other specified counseling: Secondary | ICD-10-CM

## 2022-08-08 DIAGNOSIS — C9000 Multiple myeloma not having achieved remission: Secondary | ICD-10-CM

## 2022-08-08 LAB — CMP (CANCER CENTER ONLY)
ALT: 14 U/L (ref 0–44)
AST: 10 U/L — ABNORMAL LOW (ref 15–41)
Albumin: 4 g/dL (ref 3.5–5.0)
Alkaline Phosphatase: 105 U/L (ref 38–126)
Anion gap: 6 (ref 5–15)
BUN: 9 mg/dL (ref 6–20)
CO2: 27 mmol/L (ref 22–32)
Calcium: 8.5 mg/dL — ABNORMAL LOW (ref 8.9–10.3)
Chloride: 106 mmol/L (ref 98–111)
Creatinine: 0.7 mg/dL (ref 0.61–1.24)
GFR, Estimated: >60 mL/min (ref >60)
Glucose, Bld: 138 mg/dL — ABNORMAL HIGH (ref 70–99)
Potassium: 3.7 mmol/L (ref 3.5–5.1)
Sodium: 139 mmol/L (ref 135–145)
Total Bilirubin: 0.7 mg/dL (ref 0.3–1.2)
Total Protein: 6.1 g/dL — ABNORMAL LOW (ref 6.5–8.1)

## 2022-08-08 LAB — CBC WITH DIFFERENTIAL (CANCER CENTER ONLY)
Abs Immature Granulocytes: 0.02 10*3/uL (ref 0.00–0.07)
Basophils Absolute: 0 10*3/uL (ref 0.0–0.1)
Basophils Relative: 1 %
Eosinophils Absolute: 0.5 10*3/uL (ref 0.0–0.5)
Eosinophils Relative: 6 %
HCT: 36.3 % — ABNORMAL LOW (ref 39.0–52.0)
Hemoglobin: 11.8 g/dL — ABNORMAL LOW (ref 13.0–17.0)
Immature Granulocytes: 0 %
Lymphocytes Relative: 23 %
Lymphs Abs: 1.9 10*3/uL (ref 0.7–4.0)
MCH: 25.1 pg — ABNORMAL LOW (ref 26.0–34.0)
MCHC: 32.5 g/dL (ref 30.0–36.0)
MCV: 77.1 fL — ABNORMAL LOW (ref 80.0–100.0)
Monocytes Absolute: 1 10*3/uL (ref 0.1–1.0)
Monocytes Relative: 12 %
Neutro Abs: 4.8 10*3/uL (ref 1.7–7.7)
Neutrophils Relative %: 58 %
Platelet Count: 293 10*3/uL (ref 150–400)
RBC: 4.71 MIL/uL (ref 4.22–5.81)
RDW: 20 % — ABNORMAL HIGH (ref 11.5–15.5)
WBC Count: 8.2 10*3/uL (ref 4.0–10.5)
nRBC: 0 % (ref 0.0–0.2)

## 2022-08-08 LAB — LACTATE DEHYDROGENASE: LDH: 117 U/L (ref 98–192)

## 2022-08-08 MED ORDER — SODIUM CHLORIDE 0.9 % IV SOLN
20.0000 mg | Freq: Once | INTRAVENOUS | Status: AC
  Administered 2022-08-08: 20 mg via INTRAVENOUS
  Filled 2022-08-08: qty 20

## 2022-08-08 MED ORDER — SODIUM CHLORIDE 0.9 % IV SOLN
16.0000 mg/kg | Freq: Once | INTRAVENOUS | Status: AC
  Administered 2022-08-08: 1300 mg via INTRAVENOUS
  Filled 2022-08-08: qty 60

## 2022-08-08 MED ORDER — SODIUM CHLORIDE 0.9 % IV SOLN: Freq: Once | INTRAVENOUS | Status: AC

## 2022-08-08 MED ORDER — ACETAMINOPHEN 500 MG PO TABS
1000.0000 mg | ORAL_TABLET | Freq: Once | ORAL | Status: AC
  Administered 2022-08-08: 1000 mg via ORAL

## 2022-08-08 MED ORDER — ZOLEDRONIC ACID 4 MG/100ML IV SOLN
4.0000 mg | Freq: Once | INTRAVENOUS | Status: AC
  Administered 2022-08-08: 4 mg via INTRAVENOUS

## 2022-08-08 MED ORDER — DIPHENHYDRAMINE HCL 25 MG PO CAPS
50.0000 mg | ORAL_CAPSULE | Freq: Once | ORAL | Status: AC
  Administered 2022-08-08: 50 mg via ORAL

## 2022-08-08 MED ORDER — BORTEZOMIB CHEMO SQ INJECTION 3.5 MG (2.5MG/ML)
1.3000 mg/m2 | Freq: Once | INTRAMUSCULAR | Status: AC
  Administered 2022-08-08: 2.5 mg via SUBCUTANEOUS
  Filled 2022-08-08: qty 1

## 2022-08-08 NOTE — Progress Notes (Signed)
Donald Suarez  HEMATOLOGY/ONCOLOGY CLINIC NOTE  Date of Service: 08/01/2022   Chief complaint follow-up for continued valuation and management of high risk multiple myeloma  Current treatment-daratumumab/Revlimid/Velcade/dexamethasone  INTERVAL HISTORY:  Donald Suarez is a 56 y.o. male is here for continued evaluation and management of his high-dose multiple myeloma.  He is here for cycle 4-day 1 of daratumumab Velcade Revlimid dexamethasone treatment.  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  He continues to take his Revlimid without any acute issues.  No issues with intolerance to daratumumab. Labs today and that last myeloma labs were reviewed with him through the interpreter. He has been scheduled for a primary care physician's appointment and he was given the date and time for this and counseled to avoid missing this appointment so this is quite important for him.  Patient notes energy levels have been good overall. We had an extensive discussion about next steps in treatment plan after he has now achieved very good partial response.  He is agreeable with referral to [facility] to meet with Dr. Quentin Ore or his colleagues to be evaluated for consideration of consolidative bone marrow transplantation autologous.   OBJECTIVE:  NAD  PHYSICAL EXAMINATION: .There were no vitals taken for this visit. Donald Suarez GENERAL:alert, in no acute distress and comfortable SKIN: no acute rashes, no significant lesions EYES: conjunctiva are pink and non-injected, sclera anicteric OROPHARYNX: MMM, no exudates, no oropharyngeal erythema or ulceration NECK: supple, no JVD LYMPH:  no palpable lymphadenopathy in the cervical, axillary or inguinal regions LUNGS: clear to auscultation b/l with normal respiratory effort HEART: regular rate & rhythm ABDOMEN:  normoactive bowel sounds , non tender, not  distended. Extremity: no pedal edema PSYCH: alert & oriented x 3 with fluent speech NEURO: no focal motor/sensory deficits   MEDICAL HISTORY:  Past Medical History:  Diagnosis Date   Anemia    Elevated ferritin 08/07/2022    SURGICAL HISTORY: Past Surgical History:  Procedure Laterality Date   APPENDECTOMY     [place] FLUORO GUIDE CV LINE RIGHT  05/15/2022   [place] PATIENT EVAL TECH 0-60 MINS  05/19/2022   [place] US GUIDE VASC ACCESS RIGHT  05/15/2022    SOCIAL HISTORY: Social History   Socioeconomic History   Marital status: Married    Spouse name: Not on file   Number of children: Not on file   Years of education: Not on file   Highest education level: Not on file  Occupational History   Not on file  Tobacco Use   Smoking status: Never   Smokeless tobacco: Never  Vaping Use   Vaping Use: Never used  Substance and Sexual Activity   Alcohol use: Never   Drug use: Never   Sexual activity: Not on file  Other Topics Concern   Not on file  Social History Narrative   Not on file   Social Determinants of Health   Financial Resource Strain: Medium Risk (05/21/2022)   Overall Financial Resource Strain (CARDIA)    Difficulty of Paying Living Expenses: Somewhat hard  Food Insecurity: Food Insecurity Present (05/21/2022)   Hunger Vital Sign    Worried About Running Out of Food in the Last Year: Sometimes true    Ran Out of Food in the Last Year: Sometimes true  Transportation Needs: No Transportation Needs (05/21/2022)   PRAPARE - Transportation  Lack of Transportation (Medical): No    Lack of Transportation (Non-Medical): No  Physical Activity: Not on file  Stress: Not on file  Social Connections: Not on file  Intimate Partner Violence: Not on file    FAMILY HISTORY: No family history on file.  ALLERGIES:  has No Known Allergies.  MEDICATIONS:  Reviewed REVIEW OF SYSTEMS:   10 Point review of Systems was done is negative except as noted above.  LABORATORY DATA:  I have  reviewed the data as listed  .    Latest Ref Rng & Units 08/01/2022    9:57 AM 07/25/2022    9:57 AM 07/18/2022   10:06 AM  CBC  WBC 4.0 - 10.5 K/uL 7.7  5.5  8.4   Hemoglobin 13.0 - 17.0 g/dL 11.9  11.4  10.8   Hematocrit 39.0 - 52.0 % 36.8  35.1  33.9   Platelets 150 - 400 K/uL 285  88  277     .    Latest Ref Rng & Units 08/01/2022    9:57 AM 07/25/2022    9:57 AM 07/18/2022   10:06 AM  CMP  Glucose 70 - 99 mg/dL 169  151  140   BUN 6 - 20 mg/dL 9  12  12    Creatinine 0.61 - 1.24 mg/dL 0.77  0.63  0.67   Sodium 135 - 145 mmol/L 139  140  139   Potassium 3.5 - 5.1 mmol/L 3.5  3.8  3.9   Chloride 98 - 111 mmol/L 104  108  108   CO2 22 - 32 mmol/L 27  26  25    Calcium 8.9 - 10.3 mg/dL 9.1  8.2  8.5   Total Protein 6.5 - 8.1 g/dL 6.6  6.2  6.0   Total Bilirubin 0.3 - 1.2 mg/dL 0.6  0.6  0.4   Alkaline Phos 38 - 126 U/L 126  139  142   AST 15 - 41 U/L 13  15  13    ALT 0 - 44 U/L 17  17  15     . Lab Results  Component Value Date   LDH 147 08/01/2022      RADIOGRAPHIC STUDIES: I have personally reviewed the radiological images as listed and agreed with the findings in the report. No results found.  ASSESSMENT & PLAN:   1) Progressive IgG Lambda Multiple myeloma not on treatment for more than 1 year due to patient's lack of follow-up. Patient diagnosed December 2021 and received 1 cycle of CyBorD. Had previously presented with anemia renal insufficiency and hyperviscosity symptoms. Bx- 90% plasma cells Initial M spike 8.03g/dl Del(1p): Not Detected  Dup(1q): Not Detected  Gains(15): DETECTED  Gains(5 and 9): Not Detected  Del(13q)/-13: DETECTED  Del(17p)(TP53): Not Detected  IGH(Rearrangement): SEE BELOW   IgH complex: t(4;14): DETECTED  t(11;14): Not Detected  t(14;16): Not Detected  t(14;20): Not Detected   Cytogenetics: Normal male karyotype.   -Labs from 05/13/2022-beta-2 microglobulin 4.4, LDH 197, lambda free light chain 77.1, kappa, lambda light chain  ratio 0.08, multiple myeloma panel pending.  UPEP ordered but not yet collected. 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  Due to multiple myeloma  PLAN: -Patient's labs from today were reviewed with him in detail.   Patient's myeloma labs from 07/25/2022 shows he is in very good partial response with an M spike of 0.2 g/dL . -His CBC and CMP are stable -Patient has developed some grade 1 neuropathy from the Velcade and we held his dose on cycle 4-day 1 and cycle 4-day 4. He is continuing his Revlimid orally. -He notes overall good energy levels and good p.o. intake. -I had a detailed goals of care discussion with him with the help of the interpreter and he is agreeable to be referred to Dr. Quentin Ore at [facility] for consideration of autologous hematopoietic stem cell transplantation   Follow-up Please schedule next 2 cycles of DVRD chemotherapy with port flush and labs -MD visit in 1 month Referral to Dr. Aris Lot at [facility] for consideration of consolidative transplant.  The total time spent in the appointment was 30 minutes*.  All of the patient's questions were answered with apparent satisfaction. The patient knows to call the clinic with any problems, questions or concerns.   Sullivan Lone MD MS AAHIVMS [facility] Hematology/Oncology Physician [facility]  .*Total Encounter Time as defined by the Centers for Medicare and Medicaid Services includes, in addition to the  face-to-face time of a patient visit (documented in the note above) non-face-to-face time: obtaining and reviewing outside history, ordering and reviewing medications, tests or procedures, care coordination (communications with other health care professionals or caregivers) and documentation in the medical record.

## 2022-08-08 NOTE — Progress Notes (Signed)
Ok to proceed with zometa with today's calcium level 8.5  T.O. Dr Cleda Clarks, PharmD

## 2022-08-08 NOTE — Patient Instructions (Signed)
[facility] ONCOLOGY  Discharge Instructions: Thank you for choosing [facility] to provide your oncology and hematology care.   If you have a lab appointment with the [facility], please go directly to the [facility] and check in at the registration area.   Wear comfortable clothing and clothing appropriate for easy access to any Portacath or PICC line.   We strive to give you quality time with your provider. You may need to reschedule your appointment if you arrive late (15 or more minutes).  Arriving late affects you and other patients whose appointments are after yours.  Also, if you miss three or more appointments without notifying the office, you may be dismissed from the clinic at the provider's discretion.      For prescription refill requests, have your pharmacy contact our office and allow 72 hours for refills to be completed.    Today you received the following chemotherapy and/or immunotherapy agents: Daratumumab, Velcade, & Zometa  To help prevent nausea and vomiting after your treatment, we encourage you to take your nausea medication as directed.  BELOW ARE SYMPTOMS THAT SHOULD BE REPORTED IMMEDIATELY: *FEVER GREATER THAN 100.4 F (38 C) OR HIGHER *CHILLS OR SWEATING *NAUSEA AND VOMITING THAT IS NOT CONTROLLED WITH YOUR NAUSEA MEDICATION *UNUSUAL SHORTNESS OF BREATH *UNUSUAL BRUISING OR BLEEDING *URINARY PROBLEMS (pain or burning when urinating, or frequent urination) *BOWEL PROBLEMS (unusual diarrhea, constipation, pain near the anus) TENDERNESS IN MOUTH AND THROAT WITH OR WITHOUT PRESENCE OF ULCERS (sore throat, sores in mouth, or a toothache) UNUSUAL RASH, SWELLING OR PAIN  UNUSUAL VAGINAL DISCHARGE OR ITCHING   Items with * indicate a potential emergency and should be followed up as soon as possible or go to the Emergency Department if any problems should occur.  Please show the CHEMOTHERAPY ALERT CARD or IMMUNOTHERAPY ALERT  CARD at check-in to the Emergency Department and triage nurse.  Should you have questions after your visit or need to cancel or reschedule your appointment, please contact [facility]  Dept: [phone]  and follow the prompts.  Office hours are 8:00 a.m. to 4:30 p.m. Monday - Friday. Please note that voicemails left after 4:00 p.m. may not be returned until the following business day.  We are closed weekends and major holidays. You have access to a nurse at all times for urgent questions. Please call the main number to the clinic Dept: [phone] and follow the prompts.   For any non-urgent questions, you may also contact your provider using MyChart. We now offer e-Visits for anyone 67 and older to request care online for non-urgent symptoms. For details visit [email or web address].   Also download the MyChart app! Go to the app store, search "MyChart", open the app, select [facility], and log in with your MyChart username and password.  Masks are optional in the cancer centers. If you would like for your care team to wear a mask while they are taking care of you, please let them know. For doctor visits, patients may have with them one support Meigan Pates who is at least 55 years old. At this time, visitors are not allowed in the infusion area.

## 2022-08-11 ENCOUNTER — Other Ambulatory Visit: Payer: Self-pay

## 2022-08-11 ENCOUNTER — Other Ambulatory Visit: Payer: Self-pay | Admitting: Hematology

## 2022-08-11 ENCOUNTER — Inpatient Hospital Stay: Payer: BC Managed Care – PPO

## 2022-08-11 VITALS — BP 130/76 | HR 66 | Temp 98.5°F | Resp 16 | Ht 66.0 in | Wt 163.2 lb

## 2022-08-11 DIAGNOSIS — C9000 Multiple myeloma not having achieved remission: Secondary | ICD-10-CM | POA: Diagnosis not present

## 2022-08-11 DIAGNOSIS — Z7189 Other specified counseling: Secondary | ICD-10-CM

## 2022-08-11 MED ORDER — PROCHLORPERAZINE MALEATE 10 MG PO TABS
10.0000 mg | ORAL_TABLET | Freq: Once | ORAL | Status: AC
  Administered 2022-08-11: 10 mg via ORAL

## 2022-08-11 MED ORDER — BORTEZOMIB CHEMO SQ INJECTION 3.5 MG (2.5MG/ML)
1.0000 mg/m2 | Freq: Once | INTRAMUSCULAR | Status: AC
  Administered 2022-08-11: 2 mg via SUBCUTANEOUS
  Filled 2022-08-11: qty 0.8

## 2022-08-11 NOTE — Patient Instructions (Signed)
[facility] ONCOLOGY   Discharge Instructions: Thank you for choosing [facility] to provide your oncology and hematology care.   If you have a lab appointment with the [facility], please go directly to the [facility] and check in at the registration area.   Wear comfortable clothing and clothing appropriate for easy access to any Portacath or PICC line.   We strive to give you quality time with your provider. You may need to reschedule your appointment if you arrive late (15 or more minutes).  Arriving late affects you and other patients whose appointments are after yours.  Also, if you miss three or more appointments without notifying the office, you may be dismissed from the clinic at the provider's discretion.      For prescription refill requests, have your pharmacy contact our office and allow 72 hours for refills to be completed.    Today you received the following chemotherapy and/or immunotherapy agents: Bortezomib (Velcade)       To help prevent nausea and vomiting after your treatment, we encourage you to take your nausea medication as directed.  BELOW ARE SYMPTOMS THAT SHOULD BE REPORTED IMMEDIATELY: *FEVER GREATER THAN 100.4 F (38 C) OR HIGHER *CHILLS OR SWEATING *NAUSEA AND VOMITING THAT IS NOT CONTROLLED WITH YOUR NAUSEA MEDICATION *UNUSUAL SHORTNESS OF BREATH *UNUSUAL BRUISING OR BLEEDING *URINARY PROBLEMS (pain or burning when urinating, or frequent urination) *BOWEL PROBLEMS (unusual diarrhea, constipation, pain near the anus) TENDERNESS IN MOUTH AND THROAT WITH OR WITHOUT PRESENCE OF ULCERS (sore throat, sores in mouth, or a toothache) UNUSUAL RASH, SWELLING OR PAIN  UNUSUAL VAGINAL DISCHARGE OR ITCHING   Items with * indicate a potential emergency and should be followed up as soon as possible or go to the Emergency Department if any problems should occur.  Please show the CHEMOTHERAPY ALERT CARD or IMMUNOTHERAPY ALERT CARD  at check-in to the Emergency Department and triage nurse.  Should you have questions after your visit or need to cancel or reschedule your appointment, please contact [facility]  Dept: [phone]  and follow the prompts.  Office hours are 8:00 a.m. to 4:30 p.m. Monday - Friday. Please note that voicemails left after 4:00 p.m. may not be returned until the following business day.  We are closed weekends and major holidays. You have access to a nurse at all times for urgent questions. Please call the main number to the clinic Dept: [phone] and follow the prompts.   For any non-urgent questions, you may also contact your provider using MyChart. We now offer e-Visits for anyone 78 and older to request care online for non-urgent symptoms. For details visit [email or web address].   Also download the MyChart app! Go to the app store, search "MyChart", open the app, select [facility], and log in with your MyChart username and password.  Masks are optional in the cancer centers. If you would like for your care team to wear a mask while they are taking care of you, please let them know. You may have one support person who is at least 55 years old accompany you for your appointments.

## 2022-08-12 ENCOUNTER — Other Ambulatory Visit: Payer: BC Managed Care – PPO

## 2022-08-12 ENCOUNTER — Other Ambulatory Visit (INDEPENDENT_AMBULATORY_CARE_PROVIDER_SITE_OTHER): Payer: BC Managed Care – PPO

## 2022-08-12 DIAGNOSIS — E1165 Type 2 diabetes mellitus with hyperglycemia: Secondary | ICD-10-CM

## 2022-08-12 DIAGNOSIS — R7989 Other specified abnormal findings of blood chemistry: Secondary | ICD-10-CM | POA: Diagnosis not present

## 2022-08-12 DIAGNOSIS — Z794 Long term (current) use of insulin: Secondary | ICD-10-CM

## 2022-08-12 LAB — COMPREHENSIVE METABOLIC PANEL
ALT: 18 U/L (ref 0–53)
AST: 17 U/L (ref 0–37)
Albumin: 4.1 g/dL (ref 3.5–5.2)
Alkaline Phosphatase: 88 U/L (ref 39–117)
BUN: 12 mg/dL (ref 6–23)
CO2: 26 mEq/L (ref 19–32)
Calcium: 7.9 mg/dL — ABNORMAL LOW (ref 8.4–10.5)
Chloride: 104 mEq/L (ref 96–112)
Creatinine, Ser: 0.61 mg/dL (ref 0.40–1.50)
GFR: 108.05 mL/min (ref >60.00)
Glucose, Bld: 130 mg/dL — ABNORMAL HIGH (ref 70–99)
Potassium: 3.4 mEq/L — ABNORMAL LOW (ref 3.5–5.1)
Sodium: 137 mEq/L (ref 135–145)
Total Bilirubin: 0.5 mg/dL (ref 0.2–1.2)
Total Protein: 6.3 g/dL (ref 6.0–8.3)

## 2022-08-12 LAB — LIPID PANEL
Cholesterol: 237 mg/dL — ABNORMAL HIGH (ref 0–200)
HDL: 39.8 mg/dL (ref >39.00)
LDL Cholesterol: 171 mg/dL — ABNORMAL HIGH (ref 0–99)
NonHDL: 197.06
Total CHOL/HDL Ratio: 6
Triglycerides: 130 mg/dL (ref 0.0–149.0)
VLDL: 26 mg/dL (ref 0.0–40.0)

## 2022-08-12 LAB — CBC
HCT: 38.8 % — ABNORMAL LOW (ref 39.0–52.0)
Hemoglobin: 12.1 g/dL — ABNORMAL LOW (ref 13.0–17.0)
MCHC: 31.3 g/dL (ref 30.0–36.0)
MCV: 79 fl (ref 78.0–100.0)
Platelets: 192 10*3/uL (ref 150.0–400.0)
RBC: 4.91 Mil/uL (ref 4.22–5.81)
RDW: 22 % — ABNORMAL HIGH (ref 11.5–15.5)
WBC: 6.1 10*3/uL (ref 4.0–10.5)

## 2022-08-12 LAB — MICROALBUMIN / CREATININE URINE RATIO
Creatinine,U: 248.6 mg/dL
Microalb Creat Ratio: 1.2 mg/g (ref 0.0–30.0)
Microalb, Ur: 2.9 mg/dL — ABNORMAL HIGH (ref 0.0–1.9)

## 2022-08-12 LAB — FERRITIN: Ferritin: 237.4 ng/mL (ref 22.0–322.0)

## 2022-08-13 LAB — HEMOGLOBIN A1C
Hgb A1c MFr Bld: 6.7 % of total Hgb — ABNORMAL HIGH (ref <5.7)
Mean Plasma Glucose: 146 mg/dL
eAG (mmol/L): 8.1 mmol/L

## 2022-08-13 NOTE — Progress Notes (Signed)
I received these labs from yesterday though I think they are from hematology.  They show slightly low calcium and potassium so I encourage a drink with electrolytes.  I would say gatorade but that is bad for his diabetes so I recommend sugar free gatorade also known as gatorade zero.

## 2022-08-13 NOTE — Progress Notes (Signed)
Sugar levels are controlled this is great.  They have improved a lot over the last 3 months so I think he is doing the right things to manage his diabetes.   I still recommend 3-6 month follow up even though the diabetes is currently adequately controlled, to make sure we are staying on track.

## 2022-08-14 ENCOUNTER — Other Ambulatory Visit: Payer: Self-pay | Admitting: *Deleted

## 2022-08-14 ENCOUNTER — Other Ambulatory Visit: Payer: Self-pay

## 2022-08-14 DIAGNOSIS — C9000 Multiple myeloma not having achieved remission: Secondary | ICD-10-CM

## 2022-08-14 MED FILL — Dexamethasone Sodium Phosphate Inj 100 MG/10ML: INTRAMUSCULAR | Qty: 2 | Status: AC

## 2022-08-15 ENCOUNTER — Inpatient Hospital Stay: Payer: BC Managed Care – PPO

## 2022-08-15 ENCOUNTER — Other Ambulatory Visit: Payer: Self-pay

## 2022-08-15 ENCOUNTER — Other Ambulatory Visit: Payer: Self-pay | Admitting: Hematology

## 2022-08-15 VITALS — BP 123/89 | HR 71 | Temp 98.5°F | Resp 18

## 2022-08-15 DIAGNOSIS — Z7189 Other specified counseling: Secondary | ICD-10-CM

## 2022-08-15 DIAGNOSIS — C9000 Multiple myeloma not having achieved remission: Secondary | ICD-10-CM | POA: Diagnosis not present

## 2022-08-15 LAB — CMP (CANCER CENTER ONLY)
ALT: 16 U/L (ref 0–44)
AST: 12 U/L — ABNORMAL LOW (ref 15–41)
Albumin: 4 g/dL (ref 3.5–5.0)
Alkaline Phosphatase: 85 U/L (ref 38–126)
Anion gap: 4 — ABNORMAL LOW (ref 5–15)
BUN: 12 mg/dL (ref 6–20)
CO2: 27 mmol/L (ref 22–32)
Calcium: 8.7 mg/dL — ABNORMAL LOW (ref 8.9–10.3)
Chloride: 109 mmol/L (ref 98–111)
Creatinine: 0.55 mg/dL — ABNORMAL LOW (ref 0.61–1.24)
GFR, Estimated: >60 mL/min (ref >60)
Glucose, Bld: 129 mg/dL — ABNORMAL HIGH (ref 70–99)
Potassium: 4 mmol/L (ref 3.5–5.1)
Sodium: 140 mmol/L (ref 135–145)
Total Bilirubin: 0.5 mg/dL (ref 0.3–1.2)
Total Protein: 6 g/dL — ABNORMAL LOW (ref 6.5–8.1)

## 2022-08-15 LAB — CBC WITH DIFFERENTIAL (CANCER CENTER ONLY)
Abs Immature Granulocytes: 0.01 10*3/uL (ref 0.00–0.07)
Basophils Absolute: 0 10*3/uL (ref 0.0–0.1)
Basophils Relative: 0 %
Eosinophils Absolute: 0.1 10*3/uL (ref 0.0–0.5)
Eosinophils Relative: 3 %
HCT: 38.5 % — ABNORMAL LOW (ref 39.0–52.0)
Hemoglobin: 12.5 g/dL — ABNORMAL LOW (ref 13.0–17.0)
Immature Granulocytes: 0 %
Lymphocytes Relative: 40 %
Lymphs Abs: 2.2 10*3/uL (ref 0.7–4.0)
MCH: 25.1 pg — ABNORMAL LOW (ref 26.0–34.0)
MCHC: 32.5 g/dL (ref 30.0–36.0)
MCV: 77.2 fL — ABNORMAL LOW (ref 80.0–100.0)
Monocytes Absolute: 1.1 10*3/uL — ABNORMAL HIGH (ref 0.1–1.0)
Monocytes Relative: 19 %
Neutro Abs: 2.1 10*3/uL (ref 1.7–7.7)
Neutrophils Relative %: 38 %
Platelet Count: 127 10*3/uL — ABNORMAL LOW (ref 150–400)
RBC: 4.99 MIL/uL (ref 4.22–5.81)
RDW: 19.6 % — ABNORMAL HIGH (ref 11.5–15.5)
WBC Count: 5.6 10*3/uL (ref 4.0–10.5)
nRBC: 0 % (ref 0.0–0.2)

## 2022-08-15 LAB — LACTATE DEHYDROGENASE: LDH: 121 U/L (ref 98–192)

## 2022-08-15 MED ORDER — SODIUM CHLORIDE 0.9 % IV SOLN
20.0000 mg | Freq: Once | INTRAVENOUS | Status: AC
  Administered 2022-08-15: 20 mg via INTRAVENOUS
  Filled 2022-08-15: qty 20

## 2022-08-15 MED ORDER — SODIUM CHLORIDE 0.9 % IV SOLN: Freq: Once | INTRAVENOUS | Status: AC

## 2022-08-15 MED ORDER — DIPHENHYDRAMINE HCL 25 MG PO CAPS
50.0000 mg | ORAL_CAPSULE | Freq: Once | ORAL | Status: AC
  Administered 2022-08-15: 50 mg via ORAL
  Filled 2022-08-15: qty 2

## 2022-08-15 MED ORDER — SODIUM CHLORIDE 0.9 % IV SOLN
16.0000 mg/kg | Freq: Once | INTRAVENOUS | Status: AC
  Administered 2022-08-15: 1300 mg via INTRAVENOUS
  Filled 2022-08-15: qty 60

## 2022-08-15 MED ORDER — ACETAMINOPHEN 500 MG PO TABS
1000.0000 mg | ORAL_TABLET | Freq: Once | ORAL | Status: AC
  Administered 2022-08-15: 1000 mg via ORAL
  Filled 2022-08-15: qty 2

## 2022-08-15 NOTE — Patient Instructions (Addendum)
[facility]COLOGY  Discharge Instructions: Thank you for choosing [facility] to provide your oncology and hematology care.   If you have a lab appointment with the Cancer Center, please go directly to the Cancer Center and check in at the registration area.   Wear comfortable clothing and clothing appropriate for easy access to any Portacath or PICC line.   We strive to give you quality time with your provider. You may need to reschedule your appointment if you arrive late (15 or more minutes).  Arriving late affects you and other patients whose appointments are after yours.  Also, if you miss three or more appointments without notifying the office, you may be dismissed from the clinic at the provider's discretion.      For prescription refill requests, have your pharmacy contact our office and allow 72 hours for refills to be completed.    Today you received the following chemotherapy and/or immunotherapy agent: Daratumumab   To help prevent nausea and vomiting after your treatment, we encourage you to take your nausea medication as directed.  BELOW ARE SYMPTOMS THAT SHOULD BE REPORTED IMMEDIATELY: *FEVER GREATER THAN 100.4 F (38 C) OR HIGHER *CHILLS OR SWEATING *NAUSEA AND VOMITING THAT IS NOT CONTROLLED WITH YOUR NAUSEA MEDICATION *UNUSUAL SHORTNESS OF BREATH *UNUSUAL BRUISING OR BLEEDING *URINARY PROBLEMS (pain or burning when urinating, or frequent urination) *BOWEL PROBLEMS (unusual diarrhea, constipation, pain near the anus) TENDERNESS IN MOUTH AND THROAT WITH OR WITHOUT PRESENCE OF ULCERS (sore throat, sores in mouth, or a toothache) UNUSUAL RASH, SWELLING OR PAIN  UNUSUAL VAGINAL DISCHARGE OR ITCHING   Items with * indicate a potential emergency and should be followed up as soon as possible or go to the Emergency Department if any problems should occur.  Please show the CHEMOTHERAPY ALERT CARD or IMMUNOTHERAPY ALERT CARD at check-in to  the Emergency Department and triage nurse.  Should you have questions after your visit or need to cancel or reschedule your appointment, please contact [facility] MED[facility]COL[facility]  Dept: [phone]  and follow the prompts.  Office hours are 8:00 a.m. to 4:30 p.m. Monday - Friday. Please note that voicemails left after 4:00 p.m. may not be returned until the following business day.  We are closed weekends and major holidays. You have access to a nurse at all times for urgent questions. Please call the main number to the clinic Dept: [phone] and follow the prompts.   For any non-urgent questions, you may also contact your provider using MyChart. We now offer e-Visits for anyone 18 and older to request care online for non-urgent symptoms. For details visit [email or web address].   Also download the MyChart app! Go to the app store, search "MyChart", open the app, select [facility], and log in with your MyChart username and password.  Masks are optional in the cancer centers. If you would like for your care team to wear a mask while they are taking care of you, please let them know. You may have one support person who is at least 55 years old accompany you for your appointments. 

## 2022-08-19 ENCOUNTER — Ambulatory Visit: Payer: BC Managed Care – PPO | Admitting: Internal Medicine

## 2022-08-19 ENCOUNTER — Encounter: Payer: Self-pay | Admitting: Internal Medicine

## 2022-08-19 VITALS — BP 126/80 | HR 74 | Temp 98.2°F | Resp 14 | Ht 66.0 in | Wt 161.8 lb

## 2022-08-19 DIAGNOSIS — Z794 Long term (current) use of insulin: Secondary | ICD-10-CM

## 2022-08-19 DIAGNOSIS — R809 Proteinuria, unspecified: Secondary | ICD-10-CM | POA: Diagnosis not present

## 2022-08-19 DIAGNOSIS — E1165 Type 2 diabetes mellitus with hyperglycemia: Secondary | ICD-10-CM

## 2022-08-19 DIAGNOSIS — B353 Tinea pedis: Secondary | ICD-10-CM | POA: Diagnosis not present

## 2022-08-19 HISTORY — DX: Hypocalcemia: E83.51

## 2022-08-19 HISTORY — DX: Proteinuria, unspecified: R80.9

## 2022-08-19 MED ORDER — CLOTRIMAZOLE 1 % EX CREA: TOPICAL_CREAM | Freq: Two times a day (BID) | CUTANEOUS | Status: DC

## 2022-08-19 MED ORDER — ACCU-CHEK GUIDE VI STRP: ORAL_STRIP | 11 refills | Status: AC

## 2022-08-19 MED ORDER — ACCU-CHEK SOFTCLIX LANCETS MISC: 11 refills | Status: AC

## 2022-08-19 MED ORDER — METFORMIN HCL 500 MG PO TABS: 500.0000 mg | ORAL_TABLET | Freq: Two times a day (BID) | ORAL | 3 refills | Status: DC

## 2022-08-19 NOTE — Progress Notes (Signed)
Embry Tift is a 56 y.o. male who presents today for an office visit.  Assessment/Plan:  Overview: Returns for dm mgmt- labs didn't clarify if type 1 or 2 so will try metformin with close monitoring.  Detected a slight fungal infection on foot exam, will treat, discussed importance of good foot care.  [place] was seen today for follow-up.  Type 2 diabetes mellitus with hyperglycemia, with long-term current use of insulin [facility]) Assessment & Plan: Was diagnosed @ [place] 2023 with hospital sugars in 400s Was on insulin pump but it ran out. I couldn't find any islet antibody testing, and the testing I was ordered was not completed with bloodwork for reasons uncertain However, he has pretty good post prandial sugars on almost no insulin now.   I asked him to check fasting sugars and arranged more strips/lancets I'm wondering if perhaps his diabetes might have been secondary to prednisone treatment of multiple myeloma and no longer active. To see if its type 1 or 2 I asked him to watch sugar closely while trying metformin.  He may not need either.  Will review sugars in 1 month Did dm foot exam 08/19/22 but he declined referral for eye exam Lab Results  Component Value Date   HGBA1C 6.7 (H) 08/12/2022   HGBA1C 8.2 (H) 05/13/2022   Lab Results  Component Value Date   MICROALBUR 2.9 (H) 08/12/2022   LDLCALC 171 (H) 08/12/2022   CREATININE 0.55 (L) 08/15/2022    Orders: -     metFORMIN HCl; Take 1 tablet (500 mg total) by mouth 2 (two) times daily with a meal. To start take just half tablet twice daily before meals for 3 days  Dispense: 180 tablet; Refill: 3 -     Accu-Chek Softclix Lancets; Use to test blood sugars up to 4 times daily as directed.  Dispense: 100 each; Refill: 11 -     Accu-Chek Guide; Use to test blood sugars up to 4 times daily as directed.  Dispense: 100 each; Refill: 11  Tinea pedis of both feet -     Clotrimazole  Microalbuminuria Assessment & Plan: Noted on recent  labs Have yet to add arb d/t good bp and a1c is self resolving and I'm not sure he is going to stay diabetic.   Hypocalcemia Overview: In setting of multiple myeloma   Assessment & Plan: Discussed how K and Ca low, encouraged electrolyte solution like gatorade zero        No follow-ups on file.     Subjective:  HPI:  Translated using translator Daih Siu  Medications: taking only 2 units insulin, Reports taking and tolerating without any side effects. Blood Sugars per patient: Fasting: hasn't checked, High: 180, Low:160 by patient report no diary Diet-uses lot vegetables, brown rice,  avoids bread, avoids potatoes, avoids sugar, avoids pasta Regular Exercise-walking landscaping.  Interim History- he has not been able to get any internet [place] Doesn't like television much.  He is out of strips and insulin so he hasn't been checking much   ROS: Denies Polyuria,Polydipsia, Denies  Hypoglycemia symptoms (palpitations, tremors, anxiousness)           Objective:  Physical Exam: BP 126/80 (BP Location: Left Arm)   Pulse 74   Temp 98.2 F (36.8 C) (Temporal)   Resp 14   Ht 5' 6"  (1.676 m)   Wt 161 lb 12.8 oz (73.4 kg)   SpO2 98%   BMI 26.12 kg/m  Lab Results  Component Value Date   HGBA1C 6.7 (H) 08/12/2022    Gen: No acute distress, resting comfortably Psych: Normal affect and thought content  Problem specific physical exam findings:  Very polite smile  Foot exam completed.   Diabetic Foot Exam - Simple   Simple Foot Form Diabetic Foot exam was performed with the following findings: Yes 08/19/2022  9:52 AM  Visual Inspection No deformities, no ulcerations, no other skin breakdown bilaterally: Yes Sensation Testing Intact to touch and monofilament testing bilaterally: Yes Pulse Check Posterior Tibialis and Dorsalis pulse intact bilaterally: Yes Comments Mild tinea pedis changes      Loralee Pacas, MD 08/19/2022 11:35 AM

## 2022-08-19 NOTE — Assessment & Plan Note (Signed)
Discussed how K and Ca low, encouraged electrolyte solution like gatorade zero

## 2022-08-19 NOTE — Assessment & Plan Note (Signed)
Noted on recent labs Have yet to add arb d/t good bp and a1c is self resolving and I'm not sure he is going to stay diabetic.

## 2022-08-19 NOTE — Assessment & Plan Note (Addendum)
Was diagnosed @ [place] 2023 with hospital sugars in 400s Was on insulin pump but it ran out. I couldn't find any islet antibody testing, and the testing I was ordered was not completed with bloodwork for reasons uncertain However, he has pretty good post prandial sugars on almost no insulin now.   I asked him to check fasting sugars and arranged more strips/lancets I'm wondering if perhaps his diabetes might have been secondary to prednisone treatment of multiple myeloma and no longer active. To see if its type 1 or 2 I asked him to watch sugar closely while trying metformin.  He may not need either.  Will review sugars in 1 month Did dm foot exam 08/19/22 but he declined referral for eye exam Lab Results  Component Value Date   HGBA1C 6.7 (H) 08/12/2022   HGBA1C 8.2 (H) 05/13/2022   Lab Results  Component Value Date   MICROALBUR 2.9 (H) 08/12/2022   LDLCALC 171 (H) 08/12/2022   CREATININE 0.55 (L) 08/15/2022

## 2022-08-19 NOTE — Patient Instructions (Signed)
It was a pleasure seeing you today!  Today the plan is...   Type 2 diabetes mellitus with hyperglycemia, with long-term current use of insulin [facility]) Assessment & Plan: Was diagnosed @ [place] 2023 with hospital sugars in 400s Was on insulin pump but it ran out. I couldn't find any islet antibody testing, and the testing I was ordered was not completed with bloodwork for reasons uncertain However, he has pretty good post prandial sugars on almost no insulin now.   I asked him to check fasting sugars and arranged more strips/lancets I'm wondering if perhaps his diabetes might have been secondary to prednisone treatment of multiple myeloma and no longer active. To see if its type 1 or 2 I asked him to watch sugar closely while trying metformin.  He may not need either.  Will review sugars in 1 month Did dm foot exam 08/19/22 but he declined referral for eye exam Lab Results  Component Value Date   HGBA1C 6.7 (H) 08/12/2022   HGBA1C 8.2 (H) 05/13/2022   Lab Results  Component Value Date   MICROALBUR 2.9 (H) 08/12/2022   LDLCALC 171 (H) 08/12/2022   CREATININE 0.55 (L) 08/15/2022    Orders: -     metFORMIN HCl; Take 1 tablet (500 mg total) by mouth 2 (two) times daily with a meal. To start take just half tablet twice daily before meals for 3 days  Dispense: 180 tablet; Refill: 3 -     Accu-Chek Softclix Lancets; Use to test blood sugars up to 4 times daily as directed.  Dispense: 100 each; Refill: 11 -     Accu-Chek Guide; Use to test blood sugars up to 4 times daily as directed.  Dispense: 100 each; Refill: 11  Tinea pedis of both feet -     Clotrimazole  Microalbuminuria Assessment & Plan: Noted on recent labs Have yet to add arb d/t good bp and a1c is self resolving and I'm not sure he is going to stay diabetic.   Hypocalcemia Assessment & Plan: Discussed how K and Ca low, encouraged electrolyte solution like gatorade zero       Loralee Pacas, MD   No follow-ups  on file.    - Please bring all your medicines to your next appointment. This is the best way for me to know exactly what you're taking.  - If your condition begins to worsen or become severe:  go to the ER. - If your condition fails to resolve or you have other questions / concerns: please contact me via phone [phone] or MyChart messaging.    Hemoglobin A1C Test Why am I having this test? You may have the hemoglobin A1C test (A1C test) done to: Check your risk for developing diabetes. Diagnose diabetes. Check the long-term control of blood sugar (glucose) in people who have diabetes and help make treatment decisions. This test may be done with other blood glucose tests, such as fasting blood glucose and oral glucose tolerance tests. What is being tested? Hemoglobin is a type of protein in the blood that carries oxygen. Glucose attaches to hemoglobin to form glycated hemoglobin. This test checks the amount of glycated hemoglobin in your blood. This is a good indicator of the average amount of glucose in your blood during the past 2-3 months. What kind of sample is taken?  A blood sample is required for this test. It is usually collected by inserting a needle into a blood vessel. Tell a health care provider about:  All medicines you are taking, including vitamins, herbs, eye drops, creams, and over-the-counter medicines. Any blood disorders you have. Any surgeries you have had. Any medical conditions you have. Whether you are pregnant or may be pregnant. How are the results reported? Your results will be reported as a percentage indicating how much of your hemoglobin has glucose attached to it. Your health care provider will compare your results to normal ranges established after testing a large group of people (reference ranges). Reference ranges may vary among labs and hospitals. For this test, common reference ranges are: Adult or child without diabetes: 4-5.6%. Adult or child  with prediabetes: 5.7%-6.4%. Adult or child with diabetes: 6.5% or higher. What do the results mean? If you do not have diabetes: A result within the reference range means that you are not at high risk for diabetes. A result of 5.7-6.4% means that you have a high risk of developing diabetes, and you have prediabetes. Having prediabetes puts you at risk for developing type 2 diabetes. You may have more tests, including a repeat A1C test. Results of 6.5% or higher on two separate A1C tests mean that you have diabetes. You may have more tests to confirm the diagnosis. If you have diabetes: A result of 7% usually means that your diabetes is under control. A result of less than 7% also means that diabetes is under control. This result may be an appropriate goal for those for whom there is a low risk of low blood sugar (hypoglycemia). A result of less than 8% may also mean that diabetes is under control. This result may be an appropriate goal for those who do not benefit from more blood glucose control or who are at high risk for hypoglycemia. Abnormally low A1C values may be caused by: Pregnancy. Severe blood loss. Receiving donated blood (transfusions). Low red blood cell count (anemia). Long-term kidney failure. Some unusual forms (variants) of hemoglobin. Talk with your health care provider about what your results mean. Questions to ask your health care provider Ask your health care provider, or the department that is doing the test: When will my results be ready? How will I get my results? What are my treatment options? What other tests do I need? What are my next steps? Summary The A1C test is done to check your risk for developing diabetes, diagnose diabetes, and check the long-term control of blood sugar (glucose) in people who have diabetes and help make treatment decisions. Hemoglobin is a type of protein in the blood that carries oxygen. Glucose attaches to hemoglobin to form  glycated hemoglobin. This test checks the amount of glycated hemoglobin in your blood. Talk with your health care provider about what your results mean. This information is not intended to replace advice given to you by your health care provider. Make sure you discuss any questions you have with your health care provider. Document Revised: 01/29/2022 Document Reviewed: 01/29/2022 Elsevier Patient Education  [place].

## 2022-08-29 ENCOUNTER — Telehealth: Payer: Self-pay | Admitting: Hematology

## 2022-08-29 NOTE — Telephone Encounter (Signed)
Scheduled lab appt per 8/31 sch msg. Called pt, no answer and vm was full. Mailed updated calendar to pt.

## 2022-09-04 ENCOUNTER — Other Ambulatory Visit: Payer: Self-pay | Admitting: *Deleted

## 2022-09-04 DIAGNOSIS — C9000 Multiple myeloma not having achieved remission: Secondary | ICD-10-CM

## 2022-09-05 ENCOUNTER — Inpatient Hospital Stay: Payer: BC Managed Care – PPO

## 2022-09-05 ENCOUNTER — Telehealth: Payer: Self-pay | Admitting: Internal Medicine

## 2022-09-05 NOTE — Telephone Encounter (Signed)
Patient states his heart is hurting and is unable to sleep.  Transferred to Triage.

## 2022-09-05 NOTE — Telephone Encounter (Signed)
Routing to [facility] The admin pool for follow up scheduling.  Patient Name: [facility] Gender: Male DOB: 1966/07/09 Age: 56 Y 45 M 7 D Return Phone Number: [phone] (Primary) Address: City/ State/ Zip: [place]  [address] Client [place] [facility] [place] Client Site [place] [facility] [place] Contact Type Call Who Is Calling Patient / Member / Family / Caregiver Call Type Triage / Clinical Relationship To Patient Self Return Phone Number [phone] (Primary) Chief Complaint CHEST PAIN - pain, pressure, heaviness or tightness Reason for Call Symptomatic / Request for [place] states that heart pain and unable to sleep Translation No Nurse Assessment Nurse: Mariel Sleet, RN, Erasmo Downer Date/Time (Eastern Time): 09/05/2022 10:24:36 AM Confirm and document reason for call. If symptomatic, describe symptoms. ---Caller states that his feet hurt for four days. Does the patient have any new or worsening symptoms? ---Yes Will a triage be completed? ---Yes Related visit to physician within the last 2 weeks? ---No Does the PT have any chronic conditions? (i.e. diabetes, asthma, this includes High risk factors for pregnancy, etc.) ---Yes List chronic conditions. ---diabetic Is this a behavioral health or substance abuse call? ---No Guidelines Guideline Title Affirmed Question Affirmed Notes Nurse Date/Time Eilene Ghazi Time) Foot Pain [1] MODERATE pain (e.g., interferes with normal activities, limping) AND [2] present > 3 days Mariel Sleet, RN, Erasmo Downer 09/05/2022 10:25:52 AM Disp. Time Eilene Ghazi Time) Disposition Final User 09/05/2022 10:16:09 AM Send to Urgent Queue [facility] Downer    Disp. Time Eilene Ghazi Time) Disposition Final User 09/05/2022 10:28:59 AM SEE PCP WITHIN 3 DAYS Yes Mariel Sleet, RN, Erasmo Downer Final Disposition 09/05/2022 10:28:59 AM SEE PCP WITHIN 3 DAYS Yes Donadeo, RN, Laurin Coder Disagree/Comply  Comply Caller Understands Yes PreDisposition Call Doctor Care Advice Given Per Guideline SEE PCP WITHIN 3 DAYS: * You need to be seen within 2 or 3 days. CALL BACK IF: * Fever occurs * You become worse CARE ADVICE given per Foot Pain (Adult) guideline. PAIN MEDICINES: * ACETAMINOPHEN - REGULAR STRENGTH TYLENOL: Take 650 mg (two 325 mg pills) by mouth every 4 to 6 hours as needed. Each Regular Strength Tylenol pill has 325 mg of acetaminophen. The most you should take is 10 pills a day (3,250 mg total). Note: In [place], the maximum is 12 pills a day (3,900 mg total). Referrals REFERRED TO PCP OFFICE

## 2022-09-08 ENCOUNTER — Encounter: Payer: Self-pay | Admitting: Internal Medicine

## 2022-09-08 ENCOUNTER — Ambulatory Visit: Payer: BC Managed Care – PPO | Admitting: Internal Medicine

## 2022-09-08 VITALS — BP 130/80 | HR 81 | Temp 98.4°F | Resp 14 | Ht 66.0 in | Wt 157.8 lb

## 2022-09-08 DIAGNOSIS — E1165 Type 2 diabetes mellitus with hyperglycemia: Secondary | ICD-10-CM | POA: Diagnosis not present

## 2022-09-08 DIAGNOSIS — Z1211 Encounter for screening for malignant neoplasm of colon: Secondary | ICD-10-CM | POA: Diagnosis not present

## 2022-09-08 DIAGNOSIS — G629 Polyneuropathy, unspecified: Secondary | ICD-10-CM | POA: Insufficient documentation

## 2022-09-08 DIAGNOSIS — Z794 Long term (current) use of insulin: Secondary | ICD-10-CM | POA: Diagnosis not present

## 2022-09-08 DIAGNOSIS — G609 Hereditary and idiopathic neuropathy, unspecified: Secondary | ICD-10-CM | POA: Diagnosis not present

## 2022-09-08 HISTORY — DX: Polyneuropathy, unspecified: G62.9

## 2022-09-08 MED ORDER — B-12 1000 MCG PO CAPS: 1.0000 | ORAL_CAPSULE | Freq: Every day | ORAL | 3 refills | Status: DC

## 2022-09-08 MED ORDER — GABAPENTIN 300 MG PO CAPS: 300.0000 mg | ORAL_CAPSULE | Freq: Three times a day (TID) | ORAL | 3 refills | Status: DC

## 2022-09-08 NOTE — Assessment & Plan Note (Signed)
Trial gabapentin and b12 supplement Return soon to assess response.

## 2022-09-08 NOTE — Telephone Encounter (Signed)
Patient was seen for an office visit with Dr.Morrison today. 

## 2022-09-08 NOTE — Patient Instructions (Signed)
It was a pleasure seeing you today!  Today the plan is...  Treat the burning pain with gabapentin.  Help nerves heal with b12, but only if approved by hematology they may not want b12 with chemo treatment.  Consider adjusting revlimid if burning pain gets worse- I will cc this info to Dr. Claiborne Billings.   Colon cancer screening -     Cologuard  Idiopathic peripheral neuropathy Assessment & Plan: Trial gabapentin and b12 supplement Return soon to assess response.  Orders: -     Gabapentin; Take 1 capsule (300 mg total) by mouth 3 (three) times daily.  Dispense: 90 capsule; Refill: 3 -     B-12; Take 1 tablet by mouth daily.  Dispense: 90 capsule; Refill: 3 -     Basic metabolic panel -     Vitamin B12      Loralee Pacas, MD   No follow-ups on file.    - Please bring all your medicines to your next appointment. This is the best way for me to know exactly what you're taking.  - If your condition begins to worsen or become severe:  go to the ER. - If your condition fails to resolve or you have other questions / concerns: please contact me via phone [phone] or MyChart messaging.     IF you received an x-ray today, you will receive an invoice from [facility]. Please contact [facility] at [phone] with questions or concerns regarding your invoice.    IF you received labwork today, you will receive an invoice from [facility]. Please contact [facility] at [phone] with questions or concerns regarding your invoice.    Our billing staff will not be able to assist you with questions regarding bills from these companies.   You will be contacted with the lab results as soon as they are available. The fastest way to get your results is to activate your My Chart account. Instructions are located on the last page of this paperwork. If you have not heard from [name] regarding the results in 2 weeks, please contact this office. For any labs or imaging tests, we will call you  if the results are significantly abnormal.  Most normal results will be posted to myChart as soon as they are available and I will comment on them there within 2-3 business days.

## 2022-09-08 NOTE — Progress Notes (Signed)
[place] at [employer]:  [place] Provider: Loralee Pacas, MD   Routine Medical Office Visit  Patient:  Donald Suarez      Age: 56 y.o.       Sex:  male  Date:   09/08/2022  PCP:    Loralee Pacas, MD   Assessment/Plan:   Glennis was seen today for insomnia and feet pain.  Idiopathic peripheral neuropathy Overview: Started 08/2022 A/w diabetes and revlimid usage but diabetes is controlled  Burning on top of left foot  Assessment & Plan: Trial gabapentin and b12 supplement Return soon to assess response.  Orders: -     Gabapentin; Take 1 capsule (300 mg total) by mouth 3 (three) times daily.  Dispense: 90 capsule; Refill: 3 -     B-12; Take 1 tablet by mouth daily.  Dispense: 90 capsule; Refill: 3 -     Basic metabolic panel -     Vitamin B12  Colon cancer screening -     Cologuard  Type 2 diabetes mellitus with hyperglycemia, with long-term current use of insulin (HCC)     Return in about 6 months (around 03/09/2023) for routine diabetes follow up.[name]   He was advised to call the office or go to ER if his condition worsens    Subjective:   Donald Suarez is a 56 y.o. male with PMH significant for: Past Medical History:  Diagnosis Date   Anemia    Elevated ferritin 08/07/2022   Hypocalcemia 08/19/2022   In setting of multiple myeloma    Microalbuminuria 08/19/2022   Peripheral neuropathy 09/08/2022   Started 08/2022 A/w diabetes and revlimid usage Burning on top of left foot     He main concern for today's visit is: Chief Complaint  Patient presents with   Insomnia   Feet pain    Both at night.     Additional physician collected history: Taking revlimid Legs burning up both legs from feet Especially tops of feet Off steroids now Taking metformin 500 bidac Revlimid for multiple myeloma Lab Results  Component Value Date   HGBA1C 6.7 (H) 08/12/2022   HGBA1C 8.2 (H) 05/13/2022        Pertinent negatives: patient denies any:   Blurry vision          Objective:  Physical Exam: BP 130/80 (BP Location: Left Arm, Patient Position: Sitting)   Pulse 81   Temp 98.4 F (36.9 C) (Temporal)   Resp 14   Ht 5' 6"  (1.676 m)   Wt 157 lb 12.8 oz (71.6 kg)   SpO2 98%   BMI 25.47 kg/m   He  is a polite, friendly, and genuine person Constitutional: NAD, AAO, not ill-appearing  Neuro: alert, no focal deficit obvious, articulate speech Psych: normal mood, behavior, thought content   Problem specific physical exam findings:  Communication barrier language but we have good translator Y'Keo  Results:  No results found for any visits on 09/08/22.   Recent Results (from the past 2160 hour(s))  CBC with Differential ([facility] Only)     Status: Abnormal   Collection Time: 06/13/22  9:10 AM  Result Value Ref Range   WBC Count 7.5 4.0 - 10.5 K/uL   RBC 3.75 (L) 4.22 - 5.81 MIL/uL   Hemoglobin 10.6 (L) 13.0 - 17.0 g/dL   HCT 32.2 (L) 39.0 - 52.0 %   MCV 85.9 80.0 - 100.0 fL   MCH 28.3 26.0 - 34.0 pg   MCHC  32.9 30.0 - 36.0 g/dL   RDW 18.4 (H) 11.5 - 15.5 %   Platelet Count 246 150 - 400 K/uL   nRBC 0.0 0.0 - 0.2 %   Neutrophils Relative % 64 %   Neutro Abs 4.8 1.7 - 7.7 K/uL   Lymphocytes Relative 26 %   Lymphs Abs 2.0 0.7 - 4.0 K/uL   Monocytes Relative 8 %   Monocytes Absolute 0.6 0.1 - 1.0 K/uL   Eosinophils Relative 2 %   Eosinophils Absolute 0.1 0.0 - 0.5 K/uL   Basophils Relative 0 %   Basophils Absolute 0.0 0.0 - 0.1 K/uL   Immature Granulocytes 0 %   Abs Immature Granulocytes 0.02 0.00 - 0.07 K/uL    Comment: Performed at [facility], [place] [address] [place], [place] [address]  CMP ([place] only)     Status: Abnormal   Collection Time: 06/13/22  9:10 AM  Result Value Ref Range   Sodium 135 135 - 145 mmol/L   Potassium 3.9 3.5 - 5.1 mmol/L   Chloride 103 98 - 111 mmol/L   CO2 26 22 - 32 mmol/L   Glucose, Bld 269 (H) 70 - 99 mg/dL    Comment: Glucose reference  range applies only to samples taken after fasting for at least 8 hours.   BUN 11 6 - 20 mg/dL   Creatinine 0.75 0.61 - 1.24 mg/dL   Calcium 8.6 (L) 8.9 - 10.3 mg/dL   Total Protein 7.5 6.5 - 8.1 g/dL   Albumin 3.4 (L) 3.5 - 5.0 g/dL   AST 16 15 - 41 U/L   ALT 27 0 - 44 U/L   Alkaline Phosphatase 249 (H) 38 - 126 U/L   Total Bilirubin 0.5 0.3 - 1.2 mg/dL   GFR, Estimated >60 >60 mL/min    Comment: (NOTE) Calculated using the CKD-EPI Creatinine Equation (2021)    Anion gap 6 5 - 15    Comment: Performed at [facility], [place] [address], [place], [place] [address]  Multiple Myeloma Panel (SPEP&IFE w/QIG)     Status: Abnormal   Collection Time: 06/13/22  9:10 AM  Result Value Ref Range   IgG (Immunoglobin G), Serum 1,808 (H) 603 - 1,613 mg/dL   IgA 14 (L) 90 - 386 mg/dL    Comment: Result confirmed on concentration.   IgM (Immunoglobulin M), Srm 14 (L) 20 - 172 mg/dL    Comment: Result confirmed on concentration.   Total Protein ELP 6.7 6.0 - 8.5 g/dL   Albumin SerPl Elph-Mcnc 2.8 (L) 2.9 - 4.4 g/dL   Alpha 1 0.2 0.0 - 0.4 g/dL   Alpha2 Glob SerPl Elph-Mcnc 1.1 (H) 0.4 - 1.0 g/dL   B-Globulin SerPl Elph-Mcnc 0.8 0.7 - 1.3 g/dL   Gamma Glob SerPl Elph-Mcnc 1.8 0.4 - 1.8 g/dL   M Protein SerPl Elph-Mcnc 1.7 (H) Not Observed g/dL   Globulin, Total 3.9 2.2 - 3.9 g/dL   Albumin/Glob SerPl 0.8 0.7 - 1.7   IFE 1 Comment (A)     Comment: (NOTE) Immunofixation shows IgG monoclonal protein with lambda light chain specificity.    Please Note Comment     Comment: (NOTE) Protein electrophoresis scan will follow via computer, mail, or courier delivery. Performed At: [facility] [place], [place] [id number] Rush Farmer MD [id number]   Lactate dehydrogenase (LDH)     Status: None   Collection Time: 06/20/22  8:59 AM  Result Value Ref Range  LDH 153 98 - 192 U/L    Comment: Performed at [facility], [place]  [address], [place], [place] [address]  CMP ([place] only)     Status: Abnormal   Collection Time: 06/20/22  9:00 AM  Result Value Ref Range   Sodium 136 135 - 145 mmol/L   Potassium 3.5 3.5 - 5.1 mmol/L   Chloride 105 98 - 111 mmol/L   CO2 23 22 - 32 mmol/L   Glucose, Bld 244 (H) 70 - 99 mg/dL    Comment: Glucose reference range applies only to samples taken after fasting for at least 8 hours.   BUN 11 6 - 20 mg/dL   Creatinine 0.62 0.61 - 1.24 mg/dL   Calcium 8.0 (L) 8.9 - 10.3 mg/dL   Total Protein 6.5 6.5 - 8.1 g/dL   Albumin 3.4 (L) 3.5 - 5.0 g/dL   AST 15 15 - 41 U/L   ALT 30 0 - 44 U/L   Alkaline Phosphatase 202 (H) 38 - 126 U/L   Total Bilirubin 0.4 0.3 - 1.2 mg/dL   GFR, Estimated >60 >60 mL/min    Comment: (NOTE) Calculated using the CKD-EPI Creatinine Equation (2021)    Anion gap 8 5 - 15    Comment: Performed at [facility], [place] [address] [place], [place] [address]  CBC with Differential ([place] Only)     Status: Abnormal   Collection Time: 06/20/22  9:00 AM  Result Value Ref Range   WBC Count 8.3 4.0 - 10.5 K/uL   RBC 4.13 (L) 4.22 - 5.81 MIL/uL   Hemoglobin 11.5 (L) 13.0 - 17.0 g/dL   HCT 34.6 (L) 39.0 - 52.0 %   MCV 83.8 80.0 - 100.0 fL   MCH 27.8 26.0 - 34.0 pg   MCHC 33.2 30.0 - 36.0 g/dL   RDW 18.1 (H) 11.5 - 15.5 %   Platelet Count 247 150 - 400 K/uL   nRBC 0.0 0.0 - 0.2 %   Neutrophils Relative % 53 %   Neutro Abs 4.4 1.7 - 7.7 K/uL   Lymphocytes Relative 34 %   Lymphs Abs 2.8 0.7 - 4.0 K/uL   Monocytes Relative 10 %   Monocytes Absolute 0.8 0.1 - 1.0 K/uL   Eosinophils Relative 3 %   Eosinophils Absolute 0.2 0.0 - 0.5 K/uL   Basophils Relative 0 %   Basophils Absolute 0.0 0.0 - 0.1 K/uL   Immature Granulocytes 0 %   Abs Immature Granulocytes 0.03 0.00 - 0.07 K/uL    Comment: Performed at [facility], [place] [address], [place], [place] [address]  Lactate dehydrogenase (LDH)     Status:  None   Collection Time: 06/27/22 10:12 AM  Result Value Ref Range   LDH 141 98 - 192 U/L    Comment: Performed at [facility], [place] [address], [place],  [address]  CMP ([place] only)     Status: Abnormal   Collection Time: 06/27/22 10:13 AM  Result Value Ref Range   Sodium 135 135 - 145 mmol/L   Potassium 3.5 3.5 - 5.1 mmol/L   Chloride 104 98 - 111 mmol/L   CO2 25 22 - 32 mmol/L   Glucose, Bld 177 (H) 70 - 99 mg/dL    Comment: Glucose reference range applies only to samples taken after fasting for at least 8 hours.   BUN 11 6 - 20 mg/dL   Creatinine  0.58 (L) 0.61 - 1.24 mg/dL   Calcium 8.1 (L) 8.9 - 10.3 mg/dL   Total Protein 6.1 (L) 6.5 - 8.1 g/dL   Albumin 3.3 (L) 3.5 - 5.0 g/dL   AST 17 15 - 41 U/L   ALT 35 0 - 44 U/L   Alkaline Phosphatase 143 (H) 38 - 126 U/L   Total Bilirubin 1.0 0.3 - 1.2 mg/dL   GFR, Estimated >60 >60 mL/min    Comment: (NOTE) Calculated using the CKD-EPI Creatinine Equation (2021)    Anion gap 6 5 - 15    Comment: Performed at [facility], [place] [address] [place], [place] [address]  CBC with Differential ([place] Only)     Status: Abnormal   Collection Time: 06/27/22 10:13 AM  Result Value Ref Range   WBC Count 9.1 4.0 - 10.5 K/uL   RBC 3.98 (L) 4.22 - 5.81 MIL/uL   Hemoglobin 10.7 (L) 13.0 - 17.0 g/dL   HCT 33.2 (L) 39.0 - 52.0 %   MCV 83.4 80.0 - 100.0 fL   MCH 26.9 26.0 - 34.0 pg   MCHC 32.2 30.0 - 36.0 g/dL   RDW 18.6 (H) 11.5 - 15.5 %   Platelet Count 202 150 - 400 K/uL   nRBC 0.0 0.0 - 0.2 %   Neutrophils Relative % 63 %   Neutro Abs 5.6 1.7 - 7.7 K/uL   Lymphocytes Relative 21 %   Lymphs Abs 1.9 0.7 - 4.0 K/uL   Monocytes Relative 13 %   Monocytes Absolute 1.2 (H) 0.1 - 1.0 K/uL   Eosinophils Relative 3 %   Eosinophils Absolute 0.3 0.0 - 0.5 K/uL   Basophils Relative 0 %   Basophils Absolute 0.0 0.0 - 0.1 K/uL   Immature Granulocytes 0 %   Abs Immature  Granulocytes 0.04 0.00 - 0.07 K/uL    Comment: Performed at [facility], [place] [address], [place], [place] [address]  Lactate dehydrogenase (LDH)     Status: None   Collection Time: 07/11/22  9:58 AM  Result Value Ref Range   LDH 135 98 - 192 U/L    Comment: Performed at [facility], [place] [address] [place], [place] [address]  CBC with Differential ([place] Only)     Status: Abnormal   Collection Time: 07/11/22  9:58 AM  Result Value Ref Range   WBC Count 6.3 4.0 - 10.5 K/uL   RBC 3.88 (L) 4.22 - 5.81 MIL/uL   Hemoglobin 10.0 (L) 13.0 - 17.0 g/dL   HCT 31.4 (L) 39.0 - 52.0 %   MCV 80.9 80.0 - 100.0 fL   MCH 25.8 (L) 26.0 - 34.0 pg   MCHC 31.8 30.0 - 36.0 g/dL   RDW 19.5 (H) 11.5 - 15.5 %   Platelet Count 680 (H) 150 - 400 K/uL   nRBC 0.0 0.0 - 0.2 %   Neutrophils Relative % 42 %   Neutro Abs 2.7 1.7 - 7.7 K/uL   Lymphocytes Relative 45 %   Lymphs Abs 2.8 0.7 - 4.0 K/uL   Monocytes Relative 10 %   Monocytes Absolute 0.6 0.1 - 1.0 K/uL   Eosinophils Relative 1 %   Eosinophils Absolute 0.1 0.0 - 0.5 K/uL   Basophils Relative 2 %   Basophils Absolute 0.1 0.0 - 0.1 K/uL   Immature Granulocytes 0 %   Abs Immature Granulocytes 0.02 0.00 - 0.07 K/uL    Comment: Performed at [facility], [place] [address], [place], [place] [address]  CMP ([place] only)     Status: Abnormal   Collection Time: 07/11/22  9:58 AM  Result Value Ref Range   Sodium 140 135 - 145 mmol/L   Potassium 3.9 3.5 - 5.1 mmol/L   Chloride 110 98 - 111 mmol/L   CO2 24 22 - 32 mmol/L   Glucose, Bld 225 (H) 70 - 99 mg/dL    Comment: Glucose reference range applies only to samples taken after fasting for at least 8 hours.   BUN 10 6 - 20 mg/dL   Creatinine 0.59 (L) 0.61 - 1.24 mg/dL   Calcium 8.3 (L) 8.9 - 10.3 mg/dL   Total Protein 6.1 (L) 6.5 - 8.1 g/dL   Albumin 3.4 (L) 3.5 - 5.0 g/dL   AST 15 15 - 41 U/L   ALT 23 0 - 44 U/L    Alkaline Phosphatase 155 (H) 38 - 126 U/L   Total Bilirubin 0.4 0.3 - 1.2 mg/dL   GFR, Estimated >60 >60 mL/min    Comment: (NOTE) Calculated using the CKD-EPI Creatinine Equation (2021)    Anion gap 6 5 - 15    Comment: Performed at [facility], [place] [address], [place], [place] [address]  Lactate dehydrogenase (LDH)     Status: None   Collection Time: 07/18/22 10:05 AM  Result Value Ref Range   LDH 132 98 - 192 U/L    Comment: Performed at [facility], [place] [address] [place], [place] [address]  CMP ([place] only)     Status: Abnormal   Collection Time: 07/18/22 10:06 AM  Result Value Ref Range   Sodium 139 135 - 145 mmol/L   Potassium 3.9 3.5 - 5.1 mmol/L   Chloride 108 98 - 111 mmol/L   CO2 25 22 - 32 mmol/L   Glucose, Bld 140 (H) 70 - 99 mg/dL    Comment: Glucose reference range applies only to samples taken after fasting for at least 8 hours.   BUN 12 6 - 20 mg/dL   Creatinine 0.67 0.61 - 1.24 mg/dL   Calcium 8.5 (L) 8.9 - 10.3 mg/dL   Total Protein 6.0 (L) 6.5 - 8.1 g/dL   Albumin 3.6 3.5 - 5.0 g/dL   AST 13 (L) 15 - 41 U/L   ALT 15 0 - 44 U/L   Alkaline Phosphatase 142 (H) 38 - 126 U/L   Total Bilirubin 0.4 0.3 - 1.2 mg/dL   GFR, Estimated >60 >60 mL/min    Comment: (NOTE) Calculated using the CKD-EPI Creatinine Equation (2021)    Anion gap 6 5 - 15    Comment: Performed at [facility], [place] [address], [place], [place] [address]  CBC with Differential ([place] Only)     Status: Abnormal   Collection Time: 07/18/22 10:06 AM  Result Value Ref Range   WBC Count 8.4 4.0 - 10.5 K/uL   RBC 4.24 4.22 - 5.81 MIL/uL   Hemoglobin 10.8 (L) 13.0 - 17.0 g/dL   HCT 33.9 (L) 39.0 - 52.0 %   MCV 80.0 80.0 - 100.0 fL   MCH 25.5 (L) 26.0 - 34.0 pg   MCHC 31.9 30.0 - 36.0 g/dL   RDW 20.2 (H) 11.5 - 15.5 %   Platelet Count 277 150 - 400 K/uL   nRBC 0.0 0.0 - 0.2 %   Neutrophils Relative % 47  %  Neutro Abs 4.0 1.7 - 7.7 K/uL   Lymphocytes Relative 40 %   Lymphs Abs 3.4 0.7 - 4.0 K/uL   Monocytes Relative 11 %   Monocytes Absolute 0.9 0.1 - 1.0 K/uL   Eosinophils Relative 1 %   Eosinophils Absolute 0.1 0.0 - 0.5 K/uL   Basophils Relative 1 %   Basophils Absolute 0.0 0.0 - 0.1 K/uL   Immature Granulocytes 0 %   Abs Immature Granulocytes 0.02 0.00 - 0.07 K/uL    Comment: Performed at [facility], [place] [address], [place], [place] [address]  Multiple Myeloma Panel (SPEP&IFE w/QIG)     Status: Abnormal   Collection Time: 07/25/22  9:57 AM  Result Value Ref Range   IgG (Immunoglobin G), Serum 443 (L) 603 - 1,613 mg/dL   IgA 37 (L) 90 - 386 mg/dL    Comment: Result confirmed on concentration.   IgM (Immunoglobulin M), Srm 24 20 - 172 mg/dL    Comment: Result confirmed on concentration.   Total Protein ELP 5.6 (L) 6.0 - 8.5 g/dL   Albumin SerPl Elph-Mcnc 3.1 2.9 - 4.4 g/dL   Alpha 1 0.2 0.0 - 0.4 g/dL   Alpha2 Glob SerPl Elph-Mcnc 1.0 0.4 - 1.0 g/dL   B-Globulin SerPl Elph-Mcnc 0.9 0.7 - 1.3 g/dL   Gamma Glob SerPl Elph-Mcnc 0.4 0.4 - 1.8 g/dL   M Protein SerPl Elph-Mcnc 0.2 (H) Not Observed g/dL    Comment: An additional m spike was observed at a concentration of 0.1 g/dl    Globulin, Total 2.5 2.2 - 3.9 g/dL   Albumin/Glob SerPl 1.3 0.7 - 1.7   IFE 1 Comment (A)     Comment: (NOTE) Immunofixation shows IgG monoclonal protein with lambda light chain specificity. Immunofixation shows IgG monoclonal protein with kappa light chain specificity. PLEASE NOTE: Samples from patients receiving DARZALEX(R) (daratumumab) or SARCLISA(R)(isatuximab-irfc) treatment can appear as an "IgG kappa" and mask a complete response (CR). If this patient is receiving these therapies, this IFE assay interference can be removed by ordering test number 123218-"Immunofixation, Daratumumab-Specific, Serum" or [id number]-"Immunofixation, Isatuximab-Specific, Serum" and  submitting a new sample for testing or by calling the lab to add this test to the current sample.    Please Note Comment     Comment: (NOTE) Protein electrophoresis scan will follow via computer, mail, or courier delivery. Performed At: [facility] [place], [place] [id number] Rush Farmer MD [id number]   Lactate dehydrogenase (LDH)     Status: None   Collection Time: 07/25/22  9:57 AM  Result Value Ref Range   LDH 161 98 - 192 U/L    Comment: Performed at [facility], [place] [address], [place], [place] [address]  CMP ([place] only)     Status: Abnormal   Collection Time: 07/25/22  9:57 AM  Result Value Ref Range   Sodium 140 135 - 145 mmol/L   Potassium 3.8 3.5 - 5.1 mmol/L   Chloride 108 98 - 111 mmol/L   CO2 26 22 - 32 mmol/L   Glucose, Bld 151 (H) 70 - 99 mg/dL    Comment: Glucose reference range applies only to samples taken after fasting for at least 8 hours.   BUN 12 6 - 20 mg/dL   Creatinine 0.63 0.61 - 1.24 mg/dL   Calcium 8.2 (L) 8.9 - 10.3 mg/dL   Total Protein 6.2 (L) 6.5 - 8.1 g/dL   Albumin 3.8 3.5 - 5.0 g/dL  AST 15 15 - 41 U/L   ALT 17 0 - 44 U/L   Alkaline Phosphatase 139 (H) 38 - 126 U/L   Total Bilirubin 0.6 0.3 - 1.2 mg/dL   GFR, Estimated >60 >60 mL/min    Comment: (NOTE) Calculated using the CKD-EPI Creatinine Equation (2021)    Anion gap 6 5 - 15    Comment: Performed at [facility], [place] [address], [place], [place] [address]  CBC with Differential ([place] Only)     Status: Abnormal   Collection Time: 07/25/22  9:57 AM  Result Value Ref Range   WBC Count 5.5 4.0 - 10.5 K/uL   RBC 4.49 4.22 - 5.81 MIL/uL   Hemoglobin 11.4 (L) 13.0 - 17.0 g/dL   HCT 35.1 (L) 39.0 - 52.0 %   MCV 78.2 (L) 80.0 - 100.0 fL   MCH 25.4 (L) 26.0 - 34.0 pg   MCHC 32.5 30.0 - 36.0 g/dL   RDW 20.1 (H) 11.5 - 15.5 %   Platelet Count 88 (L) 150 - 400 K/uL   nRBC 0.4 (H) 0.0 - 0.2 %    Neutrophils Relative % 35 %   Neutro Abs 1.9 1.7 - 7.7 K/uL   Lymphocytes Relative 42 %   Lymphs Abs 2.3 0.7 - 4.0 K/uL   Monocytes Relative 15 %   Monocytes Absolute 0.8 0.1 - 1.0 K/uL   Eosinophils Relative 8 %   Eosinophils Absolute 0.4 0.0 - 0.5 K/uL   Basophils Relative 0 %   Basophils Absolute 0.0 0.0 - 0.1 K/uL   Immature Granulocytes 0 %   Abs Immature Granulocytes 0.01 0.00 - 0.07 K/uL    Comment: Performed at [facility], [place] [address] [place], [place] [address]  Lactate dehydrogenase (LDH)     Status: None   Collection Time: 08/01/22  9:57 AM  Result Value Ref Range   LDH 147 98 - 192 U/L    Comment: Performed at [facility], [place] [address] [place], [place] [address]  CMP ([place] only)     Status: Abnormal   Collection Time: 08/01/22  9:57 AM  Result Value Ref Range   Sodium 139 135 - 145 mmol/L   Potassium 3.5 3.5 - 5.1 mmol/L   Chloride 104 98 - 111 mmol/L   CO2 27 22 - 32 mmol/L   Glucose, Bld 169 (H) 70 - 99 mg/dL    Comment: Glucose reference range applies only to samples taken after fasting for at least 8 hours.   BUN 9 6 - 20 mg/dL   Creatinine 0.77 0.61 - 1.24 mg/dL   Calcium 9.1 8.9 - 10.3 mg/dL   Total Protein 6.6 6.5 - 8.1 g/dL   Albumin 4.0 3.5 - 5.0 g/dL   AST 13 (L) 15 - 41 U/L   ALT 17 0 - 44 U/L   Alkaline Phosphatase 126 38 - 126 U/L   Total Bilirubin 0.6 0.3 - 1.2 mg/dL   GFR, Estimated >60 >60 mL/min    Comment: (NOTE) Calculated using the CKD-EPI Creatinine Equation (2021)    Anion gap 8 5 - 15    Comment: Performed at [facility], [place] [address] [place],  [address]  CBC with Differential ([place] Only)     Status: Abnormal   Collection Time: 08/01/22  9:57 AM  Result Value Ref Range   WBC Count 7.7 4.0 - 10.5 K/uL   RBC 4.72  4.22 - 5.81 MIL/uL   Hemoglobin 11.9 (L) 13.0 - 17.0 g/dL   HCT 36.8 (L) 39.0 - 52.0 %   MCV 78.0 (L) 80.0 -  100.0 fL   MCH 25.2 (L) 26.0 - 34.0 pg   MCHC 32.3 30.0 - 36.0 g/dL   RDW 19.9 (H) 11.5 - 15.5 %   Platelet Count 285 150 - 400 K/uL   nRBC 0.0 0.0 - 0.2 %   Neutrophils Relative % 48 %   Neutro Abs 3.7 1.7 - 7.7 K/uL   Lymphocytes Relative 31 %   Lymphs Abs 2.3 0.7 - 4.0 K/uL   Monocytes Relative 10 %   Monocytes Absolute 0.7 0.1 - 1.0 K/uL   Eosinophils Relative 11 %   Eosinophils Absolute 0.8 (H) 0.0 - 0.5 K/uL   Basophils Relative 0 %   Basophils Absolute 0.0 0.0 - 0.1 K/uL   Immature Granulocytes 0 %   Abs Immature Granulocytes 0.02 0.00 - 0.07 K/uL    Comment: Performed at [facility], [place] [address] [place], [place] [address]  Lactate dehydrogenase (LDH)     Status: None   Collection Time: 08/08/22 10:07 AM  Result Value Ref Range   LDH 117 98 - 192 U/L    Comment: Performed at [facility], [place] [address] [place], [place] [address]  CMP ([place] only)     Status: Abnormal   Collection Time: 08/08/22 10:07 AM  Result Value Ref Range   Sodium 139 135 - 145 mmol/L   Potassium 3.7 3.5 - 5.1 mmol/L   Chloride 106 98 - 111 mmol/L   CO2 27 22 - 32 mmol/L   Glucose, Bld 138 (H) 70 - 99 mg/dL    Comment: Glucose reference range applies only to samples taken after fasting for at least 8 hours.   BUN 9 6 - 20 mg/dL   Creatinine 0.70 0.61 - 1.24 mg/dL   Calcium 8.5 (L) 8.9 - 10.3 mg/dL   Total Protein 6.1 (L) 6.5 - 8.1 g/dL   Albumin 4.0 3.5 - 5.0 g/dL   AST 10 (L) 15 - 41 U/L   ALT 14 0 - 44 U/L   Alkaline Phosphatase 105 38 - 126 U/L   Total Bilirubin 0.7 0.3 - 1.2 mg/dL   GFR, Estimated >60 >60 mL/min    Comment: (NOTE) Calculated using the CKD-EPI Creatinine Equation (2021)    Anion gap 6 5 - 15    Comment: Performed at [facility], [place] [address], [place], [place] [address]  CBC with Differential ([place] Only)     Status: Abnormal   Collection Time: 08/08/22 10:07 AM  Result  Value Ref Range   WBC Count 8.2 4.0 - 10.5 K/uL   RBC 4.71 4.22 - 5.81 MIL/uL   Hemoglobin 11.8 (L) 13.0 - 17.0 g/dL   HCT 36.3 (L) 39.0 - 52.0 %   MCV 77.1 (L) 80.0 - 100.0 fL   MCH 25.1 (L) 26.0 - 34.0 pg   MCHC 32.5 30.0 - 36.0 g/dL   RDW 20.0 (H) 11.5 - 15.5 %   Platelet Count 293 150 - 400 K/uL   nRBC 0.0 0.0 - 0.2 %   Neutrophils Relative % 58 %   Neutro Abs 4.8 1.7 - 7.7 K/uL   Lymphocytes Relative 23 %   Lymphs Abs 1.9 0.7 - 4.0 K/uL   Monocytes Relative 12 %   Monocytes Absolute 1.0 0.1 - 1.0  K/uL   Eosinophils Relative 6 %   Eosinophils Absolute 0.5 0.0 - 0.5 K/uL   Basophils Relative 1 %   Basophils Absolute 0.0 0.0 - 0.1 K/uL   Immature Granulocytes 0 %   Abs Immature Granulocytes 0.02 0.00 - 0.07 K/uL    Comment: Performed at [facility], [place] [address], [place], [place] [address]  Microalbumin / creatinine urine ratio     Status: Abnormal   Collection Time: 08/12/22  8:54 AM  Result Value Ref Range   Microalb, Ur 2.9 (H) 0.0 - 1.9 mg/dL   Creatinine,U 248.6 mg/dL   Microalb Creat Ratio 1.2 0.0 - 30.0 mg/g  Ferritin     Status: None   Collection Time: 08/12/22  8:54 AM  Result Value Ref Range   Ferritin 237.4 22.0 - 322.0 ng/mL  Lipid panel     Status: Abnormal   Collection Time: 08/12/22  8:54 AM  Result Value Ref Range   Cholesterol 237 (H) 0 - 200 mg/dL    Comment: ATP III Classification       Desirable:  < 200 mg/dL               Borderline High:  200 - 239 mg/dL          High:  > = 240 mg/dL   Triglycerides 130.0 0.0 - 149.0 mg/dL    Comment: Normal:  <150 mg/dLBorderline High:  150 - 199 mg/dL   HDL 39.80 >39.00 mg/dL   VLDL 26.0 0.0 - 40.0 mg/dL   LDL Cholesterol 171 (H) 0 - 99 mg/dL   Total CHOL/HDL Ratio 6     Comment:                Men          Women1/2 Average Risk     3.4          3.3Average Risk          5.0          4.42X Average Risk          9.6          7.13X Average Risk          15.0          11.0                        NonHDL 197.06     Comment: NOTE:  Non-HDL goal should be 30 mg/dL higher than patient's LDL goal (i.e. LDL goal of < 70 mg/dL, would have non-HDL goal of < 100 mg/dL)  Comp Met (CMET)     Status: Abnormal   Collection Time: 08/12/22  8:54 AM  Result Value Ref Range   Sodium 137 135 - 145 mEq/L   Potassium 3.4 (L) 3.5 - 5.1 mEq/L   Chloride 104 96 - 112 mEq/L   CO2 26 19 - 32 mEq/L   Glucose, Bld 130 (H) 70 - 99 mg/dL   BUN 12 6 - 23 mg/dL   Creatinine, Ser 0.61 0.40 - 1.50 mg/dL   Total Bilirubin 0.5 0.2 - 1.2 mg/dL   Alkaline Phosphatase 88 39 - 117 U/L   AST 17 0 - 37 U/L   ALT 18 0 - 53 U/L   Total Protein 6.3 6.0 - 8.3 g/dL   Albumin 4.1 3.5 - 5.2 g/dL   GFR 108.05 >60.00 mL/min    Comment: Calculated using the CKD-EPI  Creatinine Equation (2021)   Calcium 7.9 (L) 8.4 - 10.5 mg/dL  CBC     Status: Abnormal   Collection Time: 08/12/22  8:54 AM  Result Value Ref Range   WBC 6.1 4.0 - 10.5 K/uL   RBC 4.91 4.22 - 5.81 Mil/uL   Platelets 192.0 150.0 - 400.0 K/uL   Hemoglobin 12.1 (L) 13.0 - 17.0 g/dL   HCT 38.8 (L) 39.0 - 52.0 %   MCV 79.0 78.0 - 100.0 fl   MCHC 31.3 30.0 - 36.0 g/dL   RDW 22.0 (H) 11.5 - 15.5 %  Hemoglobin A1c     Status: Abnormal   Collection Time: 08/12/22  3:02 PM  Result Value Ref Range   Hgb A1c MFr Bld 6.7 (H) <5.7 % of total Hgb    Comment: For someone without known diabetes, a hemoglobin A1c value of 6.5% or greater indicates that they may have  diabetes and this should be confirmed with a follow-up  test. . For someone with known diabetes, a value <7% indicates  that their diabetes is well controlled and a value  greater than or equal to 7% indicates suboptimal  control. A1c targets should be individualized based on  duration of diabetes, age, comorbid conditions, and  other considerations. . Currently, no consensus exists regarding use of hemoglobin A1c for diagnosis of diabetes for children. .    Mean Plasma Glucose 146 mg/dL   eAG  (mmol/L) 8.1 mmol/L  Lactate dehydrogenase (LDH)     Status: None   Collection Time: 08/15/22  9:45 AM  Result Value Ref Range   LDH 121 98 - 192 U/L    Comment: Performed at [facility], [place] [address] [place], [place] [address]  CMP ([place] only)     Status: Abnormal   Collection Time: 08/15/22  9:45 AM  Result Value Ref Range   Sodium 140 135 - 145 mmol/L   Potassium 4.0 3.5 - 5.1 mmol/L   Chloride 109 98 - 111 mmol/L   CO2 27 22 - 32 mmol/L   Glucose, Bld 129 (H) 70 - 99 mg/dL    Comment: Glucose reference range applies only to samples taken after fasting for at least 8 hours.   BUN 12 6 - 20 mg/dL   Creatinine 0.55 (L) 0.61 - 1.24 mg/dL   Calcium 8.7 (L) 8.9 - 10.3 mg/dL   Total Protein 6.0 (L) 6.5 - 8.1 g/dL   Albumin 4.0 3.5 - 5.0 g/dL   AST 12 (L) 15 - 41 U/L   ALT 16 0 - 44 U/L   Alkaline Phosphatase 85 38 - 126 U/L   Total Bilirubin 0.5 0.3 - 1.2 mg/dL   GFR, Estimated >60 >60 mL/min    Comment: (NOTE) Calculated using the CKD-EPI Creatinine Equation (2021)    Anion gap 4 (L) 5 - 15    Comment: Performed at [facility], [place] [address], [place], [place] [address]  CBC with Differential ([place] Only)     Status: Abnormal   Collection Time: 08/15/22  9:45 AM  Result Value Ref Range   WBC Count 5.6 4.0 - 10.5 K/uL   RBC 4.99 4.22 - 5.81 MIL/uL   Hemoglobin 12.5 (L) 13.0 - 17.0 g/dL   HCT 38.5 (L) 39.0 - 52.0 %   MCV 77.2 (L) 80.0 - 100.0 fL   MCH 25.1 (L) 26.0 - 34.0 pg   MCHC 32.5 30.0 - 36.0 g/dL   RDW  19.6 (H) 11.5 - 15.5 %   Platelet Count 127 (L) 150 - 400 K/uL   nRBC 0.0 0.0 - 0.2 %   Neutrophils Relative % 38 %   Neutro Abs 2.1 1.7 - 7.7 K/uL   Lymphocytes Relative 40 %   Lymphs Abs 2.2 0.7 - 4.0 K/uL   Monocytes Relative 19 %   Monocytes Absolute 1.1 (H) 0.1 - 1.0 K/uL   Eosinophils Relative 3 %   Eosinophils Absolute 0.1 0.0 - 0.5 K/uL   Basophils Relative 0 %   Basophils Absolute 0.0  0.0 - 0.1 K/uL   Immature Granulocytes 0 %   Abs Immature Granulocytes 0.01 0.00 - 0.07 K/uL    Comment: Performed at [facility], [place] [address] [place], [place] [address]

## 2022-09-08 NOTE — Assessment & Plan Note (Addendum)
home sugar is mostly under 130 and his A1c was under 6.7 therefore the recent outbreak of nerve pain and pathology in his legs is almost certainly not due to diabetes.  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  Therefore we will switch to 11-monthfollow-ups on the diabetes that is now controlled and he can call for sooner visit if the nerve damage gets worse but even if that happens I suggest that he discuss it with Dr. KIrene Limbobecause it is more likely going to be able to be addressed with adjusting Revlimid then with sugar management.  Nonetheless I am happy to write gabapentin which will be helpful if he needs more or for it to be adjusted

## 2022-09-09 ENCOUNTER — Other Ambulatory Visit: Payer: Self-pay

## 2022-09-11 ENCOUNTER — Other Ambulatory Visit: Payer: Self-pay | Admitting: Hematology

## 2022-09-11 ENCOUNTER — Other Ambulatory Visit: Payer: Self-pay

## 2022-09-11 ENCOUNTER — Inpatient Hospital Stay: Payer: BC Managed Care – PPO | Attending: Hematology

## 2022-09-11 ENCOUNTER — Inpatient Hospital Stay: Payer: BC Managed Care – PPO

## 2022-09-11 VITALS — BP 161/96 | HR 75 | Temp 98.2°F | Resp 16

## 2022-09-11 DIAGNOSIS — Z79899 Other long term (current) drug therapy: Secondary | ICD-10-CM | POA: Diagnosis not present

## 2022-09-11 DIAGNOSIS — C9000 Multiple myeloma not having achieved remission: Secondary | ICD-10-CM | POA: Insufficient documentation

## 2022-09-11 LAB — CMP (CANCER CENTER ONLY)
ALT: 13 U/L (ref 0–44)
AST: 13 U/L — ABNORMAL LOW (ref 15–41)
Albumin: 4.5 g/dL (ref 3.5–5.0)
Alkaline Phosphatase: 98 U/L (ref 38–126)
Anion gap: 6 (ref 5–15)
BUN: 12 mg/dL (ref 6–20)
CO2: 27 mmol/L (ref 22–32)
Calcium: 9.1 mg/dL (ref 8.9–10.3)
Chloride: 105 mmol/L (ref 98–111)
Creatinine: 0.64 mg/dL (ref 0.61–1.24)
GFR, Estimated: >60 mL/min (ref >60)
Glucose, Bld: 151 mg/dL — ABNORMAL HIGH (ref 70–99)
Potassium: 3.2 mmol/L — ABNORMAL LOW (ref 3.5–5.1)
Sodium: 138 mmol/L (ref 135–145)
Total Bilirubin: 0.6 mg/dL (ref 0.3–1.2)
Total Protein: 6.5 g/dL (ref 6.5–8.1)

## 2022-09-11 LAB — CBC WITH DIFFERENTIAL (CANCER CENTER ONLY)
Abs Immature Granulocytes: 0.01 10*3/uL (ref 0.00–0.07)
Basophils Absolute: 0.1 10*3/uL (ref 0.0–0.1)
Basophils Relative: 1 %
Eosinophils Absolute: 0.1 10*3/uL (ref 0.0–0.5)
Eosinophils Relative: 1 %
HCT: 43.2 % (ref 39.0–52.0)
Hemoglobin: 14 g/dL (ref 13.0–17.0)
Immature Granulocytes: 0 %
Lymphocytes Relative: 37 %
Lymphs Abs: 2.5 10*3/uL (ref 0.7–4.0)
MCH: 24.6 pg — ABNORMAL LOW (ref 26.0–34.0)
MCHC: 32.4 g/dL (ref 30.0–36.0)
MCV: 76.1 fL — ABNORMAL LOW (ref 80.0–100.0)
Monocytes Absolute: 0.6 10*3/uL (ref 0.1–1.0)
Monocytes Relative: 10 %
Neutro Abs: 3.4 10*3/uL (ref 1.7–7.7)
Neutrophils Relative %: 51 %
Platelet Count: 163 10*3/uL (ref 150–400)
RBC: 5.68 MIL/uL (ref 4.22–5.81)
RDW: 18.5 % — ABNORMAL HIGH (ref 11.5–15.5)
WBC Count: 6.7 10*3/uL (ref 4.0–10.5)
nRBC: 0 % (ref 0.0–0.2)

## 2022-09-11 LAB — LACTATE DEHYDROGENASE: LDH: 128 U/L (ref 98–192)

## 2022-09-11 MED ORDER — ZOLEDRONIC ACID 4 MG/100ML IV SOLN
4.0000 mg | Freq: Once | INTRAVENOUS | Status: AC
  Administered 2022-09-11: 4 mg via INTRAVENOUS
  Filled 2022-09-11: qty 100

## 2022-09-11 MED ORDER — SODIUM CHLORIDE 0.9 % IV SOLN: Freq: Once | INTRAVENOUS | Status: AC

## 2022-09-11 NOTE — Patient Instructions (Signed)

## 2022-09-15 ENCOUNTER — Encounter: Payer: Self-pay | Admitting: Internal Medicine

## 2022-09-15 ENCOUNTER — Ambulatory Visit (INDEPENDENT_AMBULATORY_CARE_PROVIDER_SITE_OTHER): Payer: BC Managed Care – PPO | Admitting: Internal Medicine

## 2022-09-15 VITALS — BP 130/89 | HR 89 | Temp 98.1°F | Ht 66.0 in | Wt 156.0 lb

## 2022-09-15 DIAGNOSIS — M79672 Pain in left foot: Secondary | ICD-10-CM | POA: Diagnosis not present

## 2022-09-15 DIAGNOSIS — M79671 Pain in right foot: Secondary | ICD-10-CM

## 2022-09-15 MED ORDER — HYDROCODONE-ACETAMINOPHEN 5-325 MG PO TABS: 1.0000 | ORAL_TABLET | Freq: Four times a day (QID) | ORAL | 0 refills | Status: DC | PRN

## 2022-09-15 MED ORDER — DICLOFENAC SODIUM 1 % EX GEL: 4.0000 g | Freq: Four times a day (QID) | CUTANEOUS | 3 refills | Status: DC | PRN

## 2022-09-15 MED ORDER — MELOXICAM 15 MG PO TABS: 15.0000 mg | ORAL_TABLET | Freq: Every day | ORAL | 0 refills | Status: DC

## 2022-09-15 NOTE — Progress Notes (Signed)
[place] at [employer]:  [phone]   Routine Medical Office Visit  Patient:  Donald Suarez      Age: 56 y.o.       Sex:  male  Date:   09/15/2022  PCP:    Loralee Pacas, [place] Provider: Loralee Pacas, MD  Assessment/Plan:   Tierre was seen today for intractable bilateral foot pain.  Foot pain, bilateral   My best guess is that this might be related to revlimid as it started before his zoledronic acid infusion.   This is very concerning because the pain is so severe and I do not think it connects to diabetes or his work because his sugars have not been that high and he says it does not feel work-related.  He says it feels like deep bone pain and it is not sensitive to pressure and is on the left foot more than the right so I am concerned for possible myeloma protein deposition or other explanation.  Therefore I am sharing this situation with oncology and also getting a podiatry referral.  Since gabapentin no relief at all I doubt neuropathy.  Blood flow seems fine on exam too- I don't think there will be any value in further vascular evaluation but would do so if consulting physicians desire it. -     DG Foot Complete Left; Future -     DG Foot Complete Right; Future -     HYDROcodone-Acetaminophen; Take 1 tablet by mouth every 6 (six) hours as needed for moderate pain or severe pain.  Dispense: 20 tablet; Refill: 0 -     Meloxicam; Take 1 tablet (15 mg total) by mouth daily.  Dispense: 30 tablet; Refill: 0 -     Diclofenac Sodium; Apply 4 g topically 4 (four) times daily as needed.  Dispense: 100 g; Refill: 3 -     Ambulatory referral to Podiatry   Return if symptoms worsen or fail to improve.   Common side effects, risks, benefits, and alternatives for medications and treatment plan prescribed today were discussed, and he expressed understanding of the given instructions.  Medication list was reconciled and provided to the patient in the AVS. Patient  instructions and summary information was reviewed with him as documented in the AVS.  Patient is instructed to call or message via MyChart if he has any questions or concerns regarding our treatment plan.  No barriers to understanding were identified.  We discussed Red Flag symptoms and signs in detail. He expressed understanding regarding what to do in case of urgent or emergency type symptoms.  He was advised to call the office or go to ER if his condition worsens. Additional information was provided in the AVS (see AVS) regarding the diagnosis/treatment plan for him to review    Subjective:   Donald Suarez is a 56 y.o. male with PMH significant for: Past Medical History:  Diagnosis Date   Anemia    Elevated ferritin 08/07/2022   Hypocalcemia 08/19/2022   In setting of multiple myeloma    Microalbuminuria 08/19/2022   Peripheral neuropathy 09/08/2022   Started 08/2022 A/w diabetes and revlimid usage Burning on top of left foot     He main concern for today's visit is: Chief Complaint  Patient presents with   Foot Pain    For about 3 weeks. Medication (cream) previously prescribed didn't help.     Additional physician collected history: Burns not numb 9/10 pain cant sleep Both  feet  Left>right Gabapentin no help at all, ibuprofen no help at all Diabetes resolved.          Objective:  Physical Exam: BP 130/89 (BP Location: Left Arm, Patient Position: Sitting)   Pulse 89   Temp 98.1 F (36.7 C) (Temporal)   Ht _0  (1.676 m)   Wt 156 lb (70.8 kg)   SpO2 99%   BMI 25.18 kg/m   He  is a polite, friendly, and genuine person Constitutional: NAD, AAO, not ill-appearing  Neuro: alert, no focal deficit obvious, articulate speech Psych: normal mood, behavior, thought content   Problem specific physical exam findings:  Foot is normal color, woram, good pulses, no tenderness no swelling.  Look and feels like a perfectly healthy foot on exam (despite severe pain)  No images are  attached to the encounter or orders placed in the encounter.    Results:  No results found for any visits on 09/15/22.   Recent Results (from the past 2160 hour(s))  Lactate dehydrogenase (LDH)     Status: None   Collection Time: 06/20/22  8:59 AM  Result Value Ref Range   LDH 153 98 - 192 U/L    Comment: Performed at [facility], [address] [place], [place] [address]  CMP ([place] only)     Status: Abnormal   Collection Time: 06/20/22  9:00 AM  Result Value Ref Range   Sodium 136 135 - 145 mmol/L   Potassium 3.5 3.5 - 5.1 mmol/L   Chloride 105 98 - 111 mmol/L   CO2 23 22 - 32 mmol/L   Glucose, Bld 244 (H) 70 - 99 mg/dL    Comment: Glucose reference range applies only to samples taken after fasting for at least 8 hours.   BUN 11 6 - 20 mg/dL   Creatinine 0.62 0.61 - 1.24 mg/dL   Calcium 8.0 (L) 8.9 - 10.3 mg/dL   Total Protein 6.5 6.5 - 8.1 g/dL   Albumin 3.4 (L) 3.5 - 5.0 g/dL   AST 15 15 - 41 U/L   ALT 30 0 - 44 U/L   Alkaline Phosphatase 202 (H) 38 - 126 U/L   Total Bilirubin 0.4 0.3 - 1.2 mg/dL   GFR, Estimated >60 >60 mL/min    Comment: (NOTE) Calculated using the CKD-EPI Creatinine Equation (2021)    Anion gap 8 5 - 15    Comment: Performed at [facility], [place] [address], [place], [place] [address]  CBC with Differential ([place] Only)     Status: Abnormal   Collection Time: 06/20/22  9:00 AM  Result Value Ref Range   WBC Count 8.3 4.0 - 10.5 K/uL   RBC 4.13 (L) 4.22 - 5.81 MIL/uL   Hemoglobin 11.5 (L) 13.0 - 17.0 g/dL   HCT 34.6 (L) 39.0 - 52.0 %   MCV 83.8 80.0 - 100.0 fL   MCH 27.8 26.0 - 34.0 pg   MCHC 33.2 30.0 - 36.0 g/dL   RDW 18.1 (H) 11.5 - 15.5 %   Platelet Count 247 150 - 400 K/uL   nRBC 0.0 0.0 - 0.2 %   Neutrophils Relative % 53 %   Neutro Abs 4.4 1.7 - 7.7 K/uL   Lymphocytes Relative 34 %   Lymphs Abs 2.8 0.7 - 4.0 K/uL   Monocytes Relative 10 %   Monocytes Absolute 0.8 0.1  - 1.0 K/uL   Eosinophils Relative 3 %   Eosinophils Absolute  0.2 0.0 - 0.5 K/uL   Basophils Relative 0 %   Basophils Absolute 0.0 0.0 - 0.1 K/uL   Immature Granulocytes 0 %   Abs Immature Granulocytes 0.03 0.00 - 0.07 K/uL    Comment: Performed at [facility], [place] [address] [place], [place] [address]  Lactate dehydrogenase (LDH)     Status: None   Collection Time: 06/27/22 10:12 AM  Result Value Ref Range   LDH 141 98 - 192 U/L    Comment: Performed at [facility], [place] [address], [place], [place] [address]  CMP ([place] only)     Status: Abnormal   Collection Time: 06/27/22 10:13 AM  Result Value Ref Range   Sodium 135 135 - 145 mmol/L   Potassium 3.5 3.5 - 5.1 mmol/L   Chloride 104 98 - 111 mmol/L   CO2 25 22 - 32 mmol/L   Glucose, Bld 177 (H) 70 - 99 mg/dL    Comment: Glucose reference range applies only to samples taken after fasting for at least 8 hours.   BUN 11 6 - 20 mg/dL   Creatinine 0.58 (L) 0.61 - 1.24 mg/dL   Calcium 8.1 (L) 8.9 - 10.3 mg/dL   Total Protein 6.1 (L) 6.5 - 8.1 g/dL   Albumin 3.3 (L) 3.5 - 5.0 g/dL   AST 17 15 - 41 U/L   ALT 35 0 - 44 U/L   Alkaline Phosphatase 143 (H) 38 - 126 U/L   Total Bilirubin 1.0 0.3 - 1.2 mg/dL   GFR, Estimated >60 >60 mL/min    Comment: (NOTE) Calculated using the CKD-EPI Creatinine Equation (2021)    Anion gap 6 5 - 15    Comment: Performed at [facility], [place] [address], [place], [place] [address]  CBC with Differential ([place] Only)     Status: Abnormal   Collection Time: 06/27/22 10:13 AM  Result Value Ref Range   WBC Count 9.1 4.0 - 10.5 K/uL   RBC 3.98 (L) 4.22 - 5.81 MIL/uL   Hemoglobin 10.7 (L) 13.0 - 17.0 g/dL   HCT 33.2 (L) 39.0 - 52.0 %   MCV 83.4 80.0 - 100.0 fL   MCH 26.9 26.0 - 34.0 pg   MCHC 32.2 30.0 - 36.0 g/dL   RDW 18.6 (H) 11.5 - 15.5 %   Platelet Count 202 150 - 400 K/uL   nRBC 0.0 0.0 - 0.2 %    Neutrophils Relative % 63 %   Neutro Abs 5.6 1.7 - 7.7 K/uL   Lymphocytes Relative 21 %   Lymphs Abs 1.9 0.7 - 4.0 K/uL   Monocytes Relative 13 %   Monocytes Absolute 1.2 (H) 0.1 - 1.0 K/uL   Eosinophils Relative 3 %   Eosinophils Absolute 0.3 0.0 - 0.5 K/uL   Basophils Relative 0 %   Basophils Absolute 0.0 0.0 - 0.1 K/uL   Immature Granulocytes 0 %   Abs Immature Granulocytes 0.04 0.00 - 0.07 K/uL    Comment: Performed at [facility], [place] [address], [place], [place] [address]  Lactate dehydrogenase (LDH)     Status: None   Collection Time: 07/11/22  9:58 AM  Result Value Ref Range   LDH 135 98 - 192 U/L    Comment: Performed at [facility], [place] [address], [place], [place] [address]  CBC with Differential ([facility] Only)     Status: Abnormal   Collection  Time: 07/11/22  9:58 AM  Result Value Ref Range   WBC Count 6.3 4.0 - 10.5 K/uL   RBC 3.88 (L) 4.22 - 5.81 MIL/uL   Hemoglobin 10.0 (L) 13.0 - 17.0 g/dL   HCT 31.4 (L) 39.0 - 52.0 %   MCV 80.9 80.0 - 100.0 fL   MCH 25.8 (L) 26.0 - 34.0 pg   MCHC 31.8 30.0 - 36.0 g/dL   RDW 19.5 (H) 11.5 - 15.5 %   Platelet Count 680 (H) 150 - 400 K/uL   nRBC 0.0 0.0 - 0.2 %   Neutrophils Relative % 42 %   Neutro Abs 2.7 1.7 - 7.7 K/uL   Lymphocytes Relative 45 %   Lymphs Abs 2.8 0.7 - 4.0 K/uL   Monocytes Relative 10 %   Monocytes Absolute 0.6 0.1 - 1.0 K/uL   Eosinophils Relative 1 %   Eosinophils Absolute 0.1 0.0 - 0.5 K/uL   Basophils Relative 2 %   Basophils Absolute 0.1 0.0 - 0.1 K/uL   Immature Granulocytes 0 %   Abs Immature Granulocytes 0.02 0.00 - 0.07 K/uL    Comment: Performed at [facility], [address], [place], [place] [address]  CMP ([place] only)     Status: Abnormal   Collection Time: 07/11/22  9:58 AM  Result Value Ref Range   Sodium 140 135 - 145 mmol/L   Potassium 3.9 3.5 - 5.1 mmol/L   Chloride 110 98 - 111 mmol/L   CO2  24 22 - 32 mmol/L   Glucose, Bld 225 (H) 70 - 99 mg/dL    Comment: Glucose reference range applies only to samples taken after fasting for at least 8 hours.   BUN 10 6 - 20 mg/dL   Creatinine 0.59 (L) 0.61 - 1.24 mg/dL   Calcium 8.3 (L) 8.9 - 10.3 mg/dL   Total Protein 6.1 (L) 6.5 - 8.1 g/dL   Albumin 3.4 (L) 3.5 - 5.0 g/dL   AST 15 15 - 41 U/L   ALT 23 0 - 44 U/L   Alkaline Phosphatase 155 (H) 38 - 126 U/L   Total Bilirubin 0.4 0.3 - 1.2 mg/dL   GFR, Estimated >60 >60 mL/min    Comment: (NOTE) Calculated using the CKD-EPI Creatinine Equation (2021)    Anion gap 6 5 - 15    Comment: Performed at [facility], [place] [address] [place], [place] [address]  Lactate dehydrogenase (LDH)     Status: None   Collection Time: 07/18/22 10:05 AM  Result Value Ref Range   LDH 132 98 - 192 U/L    Comment: Performed at [facility], [place] [address] [place], [place] [address]  CMP ([place] only)     Status: Abnormal   Collection Time: 07/18/22 10:06 AM  Result Value Ref Range   Sodium 139 135 - 145 mmol/L   Potassium 3.9 3.5 - 5.1 mmol/L   Chloride 108 98 - 111 mmol/L   CO2 25 22 - 32 mmol/L   Glucose, Bld 140 (H) 70 - 99 mg/dL    Comment: Glucose reference range applies only to samples taken after fasting for at least 8 hours.   BUN 12 6 - 20 mg/dL   Creatinine 0.67 0.61 - 1.24 mg/dL   Calcium 8.5 (L) 8.9 - 10.3 mg/dL   Total Protein 6.0 (L) 6.5 - 8.1 g/dL   Albumin 3.6 3.5 - 5.0 g/dL   AST 13 (L)  15 - 41 U/L   ALT 15 0 - 44 U/L   Alkaline Phosphatase 142 (H) 38 - 126 U/L   Total Bilirubin 0.4 0.3 - 1.2 mg/dL   GFR, Estimated >60 >60 mL/min    Comment: (NOTE) Calculated using the CKD-EPI Creatinine Equation (2021)    Anion gap 6 5 - 15    Comment: Performed at [facility], [place] [address], [place], [place] [address]  CBC with Differential ([place] Only)     Status: Abnormal   Collection Time:  07/18/22 10:06 AM  Result Value Ref Range   WBC Count 8.4 4.0 - 10.5 K/uL   RBC 4.24 4.22 - 5.81 MIL/uL   Hemoglobin 10.8 (L) 13.0 - 17.0 g/dL   HCT 33.9 (L) 39.0 - 52.0 %   MCV 80.0 80.0 - 100.0 fL   MCH 25.5 (L) 26.0 - 34.0 pg   MCHC 31.9 30.0 - 36.0 g/dL   RDW 20.2 (H) 11.5 - 15.5 %   Platelet Count 277 150 - 400 K/uL   nRBC 0.0 0.0 - 0.2 %   Neutrophils Relative % 47 %   Neutro Abs 4.0 1.7 - 7.7 K/uL   Lymphocytes Relative 40 %   Lymphs Abs 3.4 0.7 - 4.0 K/uL   Monocytes Relative 11 %   Monocytes Absolute 0.9 0.1 - 1.0 K/uL   Eosinophils Relative 1 %   Eosinophils Absolute 0.1 0.0 - 0.5 K/uL   Basophils Relative 1 %   Basophils Absolute 0.0 0.0 - 0.1 K/uL   Immature Granulocytes 0 %   Abs Immature Granulocytes 0.02 0.00 - 0.07 K/uL    Comment: Performed at [facility], [place] [address] [place], [place] [address]  Multiple Myeloma Panel (SPEP&IFE w/QIG)     Status: Abnormal   Collection Time: 07/25/22  9:57 AM  Result Value Ref Range   IgG (Immunoglobin G), Serum 443 (L) 603 - 1,613 mg/dL   IgA 37 (L) 90 - 386 mg/dL    Comment: Result confirmed on concentration.   IgM (Immunoglobulin M), Srm 24 20 - 172 mg/dL    Comment: Result confirmed on concentration.   Total Protein ELP 5.6 (L) 6.0 - 8.5 g/dL   Albumin SerPl Elph-Mcnc 3.1 2.9 - 4.4 g/dL   Alpha 1 0.2 0.0 - 0.4 g/dL   Alpha2 Glob SerPl Elph-Mcnc 1.0 0.4 - 1.0 g/dL   B-Globulin SerPl Elph-Mcnc 0.9 0.7 - 1.3 g/dL   Gamma Glob SerPl Elph-Mcnc 0.4 0.4 - 1.8 g/dL   M Protein SerPl Elph-Mcnc 0.2 (H) Not Observed g/dL    Comment: An additional m spike was observed at a concentration of 0.1 g/dl    Globulin, Total 2.5 2.2 - 3.9 g/dL   Albumin/Glob SerPl 1.3 0.7 - 1.7   IFE 1 Comment (A)     Comment: (NOTE) Immunofixation shows IgG monoclonal protein with lambda light chain specificity. Immunofixation shows IgG monoclonal protein with kappa light chain specificity. PLEASE NOTE: Samples from  patients receiving DARZALEX(R) (daratumumab) or SARCLISA(R)(isatuximab-irfc) treatment can appear as an "IgG kappa" and mask a complete response (CR). If this patient is receiving these therapies, this IFE assay interference can be removed by ordering test number 123218-"Immunofixation, Daratumumab-Specific, Serum" or [id number]-"Immunofixation, Isatuximab-Specific, Serum" and submitting a new sample for testing or by calling the lab to add this test to the current sample.    Please Note Comment     Comment: (NOTE) Protein electrophoresis scan will follow via computer, mail,  or courier delivery. Performed At: [facility] [place], [place] [id number] Rush Farmer MD NW:[id number]   Lactate dehydrogenase (LDH)     Status: None   Collection Time: 07/25/22  9:57 AM  Result Value Ref Range   LDH 161 98 - 192 U/L    Comment: Performed at [facility], [place] [address] [place], [place] [address]  CMP ([place] only)     Status: Abnormal   Collection Time: 07/25/22  9:57 AM  Result Value Ref Range   Sodium 140 135 - 145 mmol/L   Potassium 3.8 3.5 - 5.1 mmol/L   Chloride 108 98 - 111 mmol/L   CO2 26 22 - 32 mmol/L   Glucose, Bld 151 (H) 70 - 99 mg/dL    Comment: Glucose reference range applies only to samples taken after fasting for at least 8 hours.   BUN 12 6 - 20 mg/dL   Creatinine 0.63 0.61 - 1.24 mg/dL   Calcium 8.2 (L) 8.9 - 10.3 mg/dL   Total Protein 6.2 (L) 6.5 - 8.1 g/dL   Albumin 3.8 3.5 - 5.0 g/dL   AST 15 15 - 41 U/L   ALT 17 0 - 44 U/L   Alkaline Phosphatase 139 (H) 38 - 126 U/L   Total Bilirubin 0.6 0.3 - 1.2 mg/dL   GFR, Estimated >60 >60 mL/min    Comment: (NOTE) Calculated using the CKD-EPI Creatinine Equation (2021)    Anion gap 6 5 - 15    Comment: Performed at [facility], [place] [address], [place], [place] [address]  CBC with Differential ([place] Only)     Status: Abnormal    Collection Time: 07/25/22  9:57 AM  Result Value Ref Range   WBC Count 5.5 4.0 - 10.5 K/uL   RBC 4.49 4.22 - 5.81 MIL/uL   Hemoglobin 11.4 (L) 13.0 - 17.0 g/dL   HCT 35.1 (L) 39.0 - 52.0 %   MCV 78.2 (L) 80.0 - 100.0 fL   MCH 25.4 (L) 26.0 - 34.0 pg   MCHC 32.5 30.0 - 36.0 g/dL   RDW 20.1 (H) 11.5 - 15.5 %   Platelet Count 88 (L) 150 - 400 K/uL   nRBC 0.4 (H) 0.0 - 0.2 %   Neutrophils Relative % 35 %   Neutro Abs 1.9 1.7 - 7.7 K/uL   Lymphocytes Relative 42 %   Lymphs Abs 2.3 0.7 - 4.0 K/uL   Monocytes Relative 15 %   Monocytes Absolute 0.8 0.1 - 1.0 K/uL   Eosinophils Relative 8 %   Eosinophils Absolute 0.4 0.0 - 0.5 K/uL   Basophils Relative 0 %   Basophils Absolute 0.0 0.0 - 0.1 K/uL   Immature Granulocytes 0 %   Abs Immature Granulocytes 0.01 0.00 - 0.07 K/uL    Comment: Performed at [facility], [place] [address] [place], [place] [address]  Lactate dehydrogenase (LDH)     Status: None   Collection Time: 08/01/22  9:57 AM  Result Value Ref Range   LDH 147 98 - 192 U/L    Comment: Performed at [facility], [place] [address] [place], [place] [address]  CMP ([place] only)     Status: Abnormal   Collection Time: 08/01/22  9:57 AM  Result Value Ref Range   Sodium 139 135 - 145 mmol/L   Potassium 3.5 3.5 - 5.1 mmol/L   Chloride 104 98 - 111 mmol/L  CO2 27 22 - 32 mmol/L   Glucose, Bld 169 (H) 70 - 99 mg/dL    Comment: Glucose reference range applies only to samples taken after fasting for at least 8 hours.   BUN 9 6 - 20 mg/dL   Creatinine 0.77 0.61 - 1.24 mg/dL   Calcium 9.1 8.9 - 10.3 mg/dL   Total Protein 6.6 6.5 - 8.1 g/dL   Albumin 4.0 3.5 - 5.0 g/dL   AST 13 (L) 15 - 41 U/L   ALT 17 0 - 44 U/L   Alkaline Phosphatase 126 38 - 126 U/L   Total Bilirubin 0.6 0.3 - 1.2 mg/dL   GFR, Estimated >60 >60 mL/min    Comment: (NOTE) Calculated using the CKD-EPI Creatinine Equation (2021)    Anion gap 8 5 - 15     Comment: Performed at [facility], [place] [address], [place], [place] [address]  CBC with Differential ([place] Only)     Status: Abnormal   Collection Time: 08/01/22  9:57 AM  Result Value Ref Range   WBC Count 7.7 4.0 - 10.5 K/uL   RBC 4.72 4.22 - 5.81 MIL/uL   Hemoglobin 11.9 (L) 13.0 - 17.0 g/dL   HCT 36.8 (L) 39.0 - 52.0 %   MCV 78.0 (L) 80.0 - 100.0 fL   MCH 25.2 (L) 26.0 - 34.0 pg   MCHC 32.3 30.0 - 36.0 g/dL   RDW 19.9 (H) 11.5 - 15.5 %   Platelet Count 285 150 - 400 K/uL   nRBC 0.0 0.0 - 0.2 %   Neutrophils Relative % 48 %   Neutro Abs 3.7 1.7 - 7.7 K/uL   Lymphocytes Relative 31 %   Lymphs Abs 2.3 0.7 - 4.0 K/uL   Monocytes Relative 10 %   Monocytes Absolute 0.7 0.1 - 1.0 K/uL   Eosinophils Relative 11 %   Eosinophils Absolute 0.8 (H) 0.0 - 0.5 K/uL   Basophils Relative 0 %   Basophils Absolute 0.0 0.0 - 0.1 K/uL   Immature Granulocytes 0 %   Abs Immature Granulocytes 0.02 0.00 - 0.07 K/uL    Comment: Performed at [facility], [place] [address] [place], [place] [address]  Lactate dehydrogenase (LDH)     Status: None   Collection Time: 08/08/22 10:07 AM  Result Value Ref Range   LDH 117 98 - 192 U/L    Comment: Performed at [facility], [place] [address], [place], [place] [address]  CMP ([place] only)     Status: Abnormal   Collection Time: 08/08/22 10:07 AM  Result Value Ref Range   Sodium 139 135 - 145 mmol/L   Potassium 3.7 3.5 - 5.1 mmol/L   Chloride 106 98 - 111 mmol/L   CO2 27 22 - 32 mmol/L   Glucose, Bld 138 (H) 70 - 99 mg/dL    Comment: Glucose reference range applies only to samples taken after fasting for at least 8 hours.   BUN 9 6 - 20 mg/dL   Creatinine 0.70 0.61 - 1.24 mg/dL   Calcium 8.5 (L) 8.9 - 10.3 mg/dL   Total Protein 6.1 (L) 6.5 - 8.1 g/dL   Albumin 4.0 3.5 - 5.0 g/dL   AST 10 (L) 15 - 41 U/L   ALT 14 0 - 44 U/L   Alkaline Phosphatase 105 38 - 126 U/L    Total Bilirubin 0.7 0.3 - 1.2 mg/dL   GFR, Estimated >60 >60 mL/min  Comment: (NOTE) Calculated using the CKD-EPI Creatinine Equation (2021)    Anion gap 6 5 - 15    Comment: Performed at [facility], [place] [address] [place], [place] [address]  CBC with Differential ([place] Only)     Status: Abnormal   Collection Time: 08/08/22 10:07 AM  Result Value Ref Range   WBC Count 8.2 4.0 - 10.5 K/uL   RBC 4.71 4.22 - 5.81 MIL/uL   Hemoglobin 11.8 (L) 13.0 - 17.0 g/dL   HCT 36.3 (L) 39.0 - 52.0 %   MCV 77.1 (L) 80.0 - 100.0 fL   MCH 25.1 (L) 26.0 - 34.0 pg   MCHC 32.5 30.0 - 36.0 g/dL   RDW 20.0 (H) 11.5 - 15.5 %   Platelet Count 293 150 - 400 K/uL   nRBC 0.0 0.0 - 0.2 %   Neutrophils Relative % 58 %   Neutro Abs 4.8 1.7 - 7.7 K/uL   Lymphocytes Relative 23 %   Lymphs Abs 1.9 0.7 - 4.0 K/uL   Monocytes Relative 12 %   Monocytes Absolute 1.0 0.1 - 1.0 K/uL   Eosinophils Relative 6 %   Eosinophils Absolute 0.5 0.0 - 0.5 K/uL   Basophils Relative 1 %   Basophils Absolute 0.0 0.0 - 0.1 K/uL   Immature Granulocytes 0 %   Abs Immature Granulocytes 0.02 0.00 - 0.07 K/uL    Comment: Performed at [facility], [place] [address] [place], [place] [address]  Microalbumin / creatinine urine ratio     Status: Abnormal   Collection Time: 08/12/22  8:54 AM  Result Value Ref Range   Microalb, Ur 2.9 (H) 0.0 - 1.9 mg/dL   Creatinine,U 248.6 mg/dL   Microalb Creat Ratio 1.2 0.0 - 30.0 mg/g  Ferritin     Status: None   Collection Time: 08/12/22  8:54 AM  Result Value Ref Range   Ferritin 237.4 22.0 - 322.0 ng/mL  Lipid panel     Status: Abnormal   Collection Time: 08/12/22  8:54 AM  Result Value Ref Range   Cholesterol 237 (H) 0 - 200 mg/dL    Comment: ATP III Classification       Desirable:  < 200 mg/dL               Borderline High:  200 - 239 mg/dL          High:  > = 240 mg/dL   Triglycerides 130.0 0.0 - 149.0 mg/dL    Comment: Normal:   <150 mg/dLBorderline High:  150 - 199 mg/dL   HDL 39.80 >39.00 mg/dL   VLDL 26.0 0.0 - 40.0 mg/dL   LDL Cholesterol 171 (H) 0 - 99 mg/dL   Total CHOL/HDL Ratio 6     Comment:                Men          Women1/2 Average Risk     3.4          3.3Average Risk          5.0          4.42X Average Risk          9.6          7.13X Average Risk          15.0          11.0  NonHDL 197.06     Comment: NOTE:  Non-HDL goal should be 30 mg/dL higher than patient's LDL goal (i.e. LDL goal of < 70 mg/dL, would have non-HDL goal of < 100 mg/dL)  Comp Met (CMET)     Status: Abnormal   Collection Time: 08/12/22  8:54 AM  Result Value Ref Range   Sodium 137 135 - 145 mEq/L   Potassium 3.4 (L) 3.5 - 5.1 mEq/L   Chloride 104 96 - 112 mEq/L   CO2 26 19 - 32 mEq/L   Glucose, Bld 130 (H) 70 - 99 mg/dL   BUN 12 6 - 23 mg/dL   Creatinine, Ser 0.61 0.40 - 1.50 mg/dL   Total Bilirubin 0.5 0.2 - 1.2 mg/dL   Alkaline Phosphatase 88 39 - 117 U/L   AST 17 0 - 37 U/L   ALT 18 0 - 53 U/L   Total Protein 6.3 6.0 - 8.3 g/dL   Albumin 4.1 3.5 - 5.2 g/dL   GFR 108.05 >60.00 mL/min    Comment: Calculated using the CKD-EPI Creatinine Equation (2021)   Calcium 7.9 (L) 8.4 - 10.5 mg/dL  CBC     Status: Abnormal   Collection Time: 08/12/22  8:54 AM  Result Value Ref Range   WBC 6.1 4.0 - 10.5 K/uL   RBC 4.91 4.22 - 5.81 Mil/uL   Platelets 192.0 150.0 - 400.0 K/uL   Hemoglobin 12.1 (L) 13.0 - 17.0 g/dL   HCT 38.8 (L) 39.0 - 52.0 %   MCV 79.0 78.0 - 100.0 fl   MCHC 31.3 30.0 - 36.0 g/dL   RDW 22.0 (H) 11.5 - 15.5 %  Hemoglobin A1c     Status: Abnormal   Collection Time: 08/12/22  3:02 PM  Result Value Ref Range   Hgb A1c MFr Bld 6.7 (H) <5.7 % of total Hgb    Comment: For someone without known diabetes, a hemoglobin A1c value of 6.5% or greater indicates that they may have  diabetes and this should be confirmed with a follow-up  test. . For someone with known diabetes, a value <7% indicates   that their diabetes is well controlled and a value  greater than or equal to 7% indicates suboptimal  control. A1c targets should be individualized based on  duration of diabetes, age, comorbid conditions, and  other considerations. . Currently, no consensus exists regarding use of hemoglobin A1c for diagnosis of diabetes for children. .    Mean Plasma Glucose 146 mg/dL   eAG (mmol/L) 8.1 mmol/L  Lactate dehydrogenase (LDH)     Status: None   Collection Time: 08/15/22  9:45 AM  Result Value Ref Range   LDH 121 98 - 192 U/L    Comment: Performed at [facility], [place] [address] [place], [place] [address]  CMP ([place] only)     Status: Abnormal   Collection Time: 08/15/22  9:45 AM  Result Value Ref Range   Sodium 140 135 - 145 mmol/L   Potassium 4.0 3.5 - 5.1 mmol/L   Chloride 109 98 - 111 mmol/L   CO2 27 22 - 32 mmol/L   Glucose, Bld 129 (H) 70 - 99 mg/dL    Comment: Glucose reference range applies only to samples taken after fasting for at least 8 hours.   BUN 12 6 - 20 mg/dL   Creatinine 0.55 (L) 0.61 - 1.24 mg/dL   Calcium 8.7 (L) 8.9 - 10.3 mg/dL   Total Protein 6.0 (L) 6.5 -  8.1 g/dL   Albumin 4.0 3.5 - 5.0 g/dL   AST 12 (L) 15 - 41 U/L   ALT 16 0 - 44 U/L   Alkaline Phosphatase 85 38 - 126 U/L   Total Bilirubin 0.5 0.3 - 1.2 mg/dL   GFR, Estimated >60 >60 mL/min    Comment: (NOTE) Calculated using the CKD-EPI Creatinine Equation (2021)    Anion gap 4 (L) 5 - 15    Comment: Performed at [facility], [place] [address], [place], [place] [address]  CBC with Differential ([place] Only)     Status: Abnormal   Collection Time: 08/15/22  9:45 AM  Result Value Ref Range   WBC Count 5.6 4.0 - 10.5 K/uL   RBC 4.99 4.22 - 5.81 MIL/uL   Hemoglobin 12.5 (L) 13.0 - 17.0 g/dL   HCT 38.5 (L) 39.0 - 52.0 %   MCV 77.2 (L) 80.0 - 100.0 fL   MCH 25.1 (L) 26.0 - 34.0 pg   MCHC 32.5 30.0 - 36.0 g/dL   RDW 19.6 (H) 11.5 -  15.5 %   Platelet Count 127 (L) 150 - 400 K/uL   nRBC 0.0 0.0 - 0.2 %   Neutrophils Relative % 38 %   Neutro Abs 2.1 1.7 - 7.7 K/uL   Lymphocytes Relative 40 %   Lymphs Abs 2.2 0.7 - 4.0 K/uL   Monocytes Relative 19 %   Monocytes Absolute 1.1 (H) 0.1 - 1.0 K/uL   Eosinophils Relative 3 %   Eosinophils Absolute 0.1 0.0 - 0.5 K/uL   Basophils Relative 0 %   Basophils Absolute 0.0 0.0 - 0.1 K/uL   Immature Granulocytes 0 %   Abs Immature Granulocytes 0.01 0.00 - 0.07 K/uL    Comment: Performed at [facility], [place] [address], [place], [place] [address]  Lactate dehydrogenase (LDH)     Status: None   Collection Time: 09/11/22  1:55 PM  Result Value Ref Range   LDH 128 98 - 192 U/L    Comment: Performed at [facility], [place] [address], [place], [place] [address]  CMP ([place] only)     Status: Abnormal   Collection Time: 09/11/22  1:55 PM  Result Value Ref Range   Sodium 138 135 - 145 mmol/L   Potassium 3.2 (L) 3.5 - 5.1 mmol/L   Chloride 105 98 - 111 mmol/L   CO2 27 22 - 32 mmol/L   Glucose, Bld 151 (H) 70 - 99 mg/dL    Comment: Glucose reference range applies only to samples taken after fasting for at least 8 hours.   BUN 12 6 - 20 mg/dL   Creatinine 0.64 0.61 - 1.24 mg/dL   Calcium 9.1 8.9 - 10.3 mg/dL   Total Protein 6.5 6.5 - 8.1 g/dL   Albumin 4.5 3.5 - 5.0 g/dL   AST 13 (L) 15 - 41 U/L   ALT 13 0 - 44 U/L   Alkaline Phosphatase 98 38 - 126 U/L   Total Bilirubin 0.6 0.3 - 1.2 mg/dL   GFR, Estimated >60 >60 mL/min    Comment: (NOTE) Calculated using the CKD-EPI Creatinine Equation (2021)    Anion gap 6 5 - 15    Comment: Performed at [facility], [place] [address], [place], [place] [address]  CBC with Differential ([place] Only)     Status: Abnormal   Collection Time: 09/11/22  1:55 PM  Result Value Ref  Range   WBC Count 6.7 4.0 - 10.5 K/uL   RBC 5.68 4.22 - 5.81 MIL/uL   Hemoglobin  14.0 13.0 - 17.0 g/dL   HCT 43.2 39.0 - 52.0 %   MCV 76.1 (L) 80.0 - 100.0 fL   MCH 24.6 (L) 26.0 - 34.0 pg   MCHC 32.4 30.0 - 36.0 g/dL   RDW 18.5 (H) 11.5 - 15.5 %   Platelet Count 163 150 - 400 K/uL   nRBC 0.0 0.0 - 0.2 %   Neutrophils Relative % 51 %   Neutro Abs 3.4 1.7 - 7.7 K/uL   Lymphocytes Relative 37 %   Lymphs Abs 2.5 0.7 - 4.0 K/uL   Monocytes Relative 10 %   Monocytes Absolute 0.6 0.1 - 1.0 K/uL   Eosinophils Relative 1 %   Eosinophils Absolute 0.1 0.0 - 0.5 K/uL   Basophils Relative 1 %   Basophils Absolute 0.1 0.0 - 0.1 K/uL   Immature Granulocytes 0 %   Abs Immature Granulocytes 0.01 0.00 - 0.07 K/uL    Comment: Performed at [facility], [address] [place], [place] [address]

## 2022-09-15 NOTE — Patient Instructions (Addendum)
It was a pleasure seeing you today!  Today the plan is...  Please stop by x-ray before you go If you have mychart- we will send your results within 3 business days of [name] receiving them.  If you do not have mychart- we will call you about results within 5 business days of [name] receiving them.    Please go to our [facility] office to get your xrays done. You can walk in M-F between 8:30am- noon or 1pm - 5pm. Tell them you are there for xrays ordered by me. They will send me the results, then I will let you know the results with instructions.      Address: [address]  The Xray department is located in the basement.    Foot pain, bilateral -     DG Foot Complete Left; Future -     DG Foot Complete Right; Future -     HYDROcodone-Acetaminophen; Take 1 tablet by mouth every 6 (six) hours as needed for moderate pain or severe pain.  Dispense: 20 tablet; Refill: 0 -     Meloxicam; Take 1 tablet (15 mg total) by mouth daily.  Dispense: 30 tablet; Refill: 0 -     Diclofenac Sodium; Apply 4 g topically 4 (four) times daily as needed.  Dispense: 100 g; Refill: 3 -     Ambulatory referral to Podiatry    Loralee Pacas, MD   Return if symptoms worsen or fail to improve.    - Please bring all your medicines to your next appointment. This is the best way for me to know exactly what you're taking.  - If your condition begins to worsen or become severe:  go to the ER. - If your condition fails to resolve or you have other questions / concerns: please contact me via phone [phone] or MyChart messaging.     IF you received an x-ray today, you will receive an invoice from [facility]. Please contact [facility] at [phone] with questions or concerns regarding your invoice.    IF you received labwork today, you will receive an invoice from [place]. Please contact [facility] at [phone] with questions or concerns regarding your invoice.    Our billing  staff will not be able to assist you with questions regarding bills from these companies.   You will be contacted with the lab results as soon as they are available. The fastest way to get your results is to activate your My Chart account. Instructions are located on the last page of this paperwork. If you have not heard from [name] regarding the results in 2 weeks, please contact this office. For any labs or imaging tests, we will call you if the results are significantly abnormal.  Most normal results will be posted to myChart as soon as they are available and I will comment on them there within 2-3 business days.

## 2022-09-16 ENCOUNTER — Ambulatory Visit: Payer: BC Managed Care – PPO | Admitting: Internal Medicine

## 2022-09-17 ENCOUNTER — Other Ambulatory Visit: Payer: Self-pay

## 2022-09-17 ENCOUNTER — Ambulatory Visit (INDEPENDENT_AMBULATORY_CARE_PROVIDER_SITE_OTHER)
Admission: RE | Admit: 2022-09-17 | Discharge: 2022-09-17 | Disposition: A | Discharge status: Home / Self Care | Payer: BC Managed Care – PPO | Source: Ambulatory Visit | Attending: Internal Medicine | Admitting: Internal Medicine

## 2022-09-17 ENCOUNTER — Other Ambulatory Visit: Payer: Self-pay | Admitting: Internal Medicine

## 2022-09-17 ENCOUNTER — Encounter (HOSPITAL_COMMUNITY): Payer: BC Managed Care – PPO | Admitting: Dentistry

## 2022-09-17 DIAGNOSIS — M79671 Pain in right foot: Secondary | ICD-10-CM | POA: Diagnosis not present

## 2022-09-17 DIAGNOSIS — M79672 Pain in left foot: Secondary | ICD-10-CM

## 2022-09-17 DIAGNOSIS — C9000 Multiple myeloma not having achieved remission: Secondary | ICD-10-CM

## 2022-09-17 LAB — MULTIPLE MYELOMA PANEL, SERUM
Albumin SerPl Elph-Mcnc: 3.8 g/dL (ref 2.9–4.4)
Albumin/Glob SerPl: 1.6 (ref 0.7–1.7)
Alpha 1: 0.1 g/dL (ref 0.0–0.4)
Alpha2 Glob SerPl Elph-Mcnc: 0.9 g/dL (ref 0.4–1.0)
B-Globulin SerPl Elph-Mcnc: 0.8 g/dL (ref 0.7–1.3)
Gamma Glob SerPl Elph-Mcnc: 0.5 g/dL (ref 0.4–1.8)
Globulin, Total: 2.4 g/dL (ref 2.2–3.9)
IgA: 57 mg/dL — ABNORMAL LOW (ref 90–386)
IgG (Immunoglobin G), Serum: 535 mg/dL — ABNORMAL LOW (ref 603–1613)
IgM (Immunoglobulin M), Srm: 22 mg/dL (ref 20–172)
M Protein SerPl Elph-Mcnc: 0.2 g/dL — ABNORMAL HIGH
Total Protein ELP: 6.2 g/dL (ref 6.0–8.5)

## 2022-09-17 MED ORDER — LENALIDOMIDE 15 MG PO CAPS: ORAL_CAPSULE | ORAL | 0 refills | Status: DC

## 2022-09-19 NOTE — Progress Notes (Signed)
Good news nothing seen on xray but I still dont have explanation for pain.   Hopeful he can see podiatry soon check on that referral and call and let him know about that and this xray being normal (except mild arthritis)

## 2022-09-22 LAB — COLOGUARD

## 2022-10-01 ENCOUNTER — Other Ambulatory Visit (HOSPITAL_COMMUNITY): Payer: BC Managed Care – PPO | Admitting: Dentistry

## 2022-10-03 ENCOUNTER — Inpatient Hospital Stay: Payer: BC Managed Care – PPO

## 2022-10-06 ENCOUNTER — Ambulatory Visit: Payer: BC Managed Care – PPO | Admitting: Podiatry

## 2022-10-09 ENCOUNTER — Other Ambulatory Visit: Payer: Self-pay | Admitting: *Deleted

## 2022-10-09 DIAGNOSIS — C9000 Multiple myeloma not having achieved remission: Secondary | ICD-10-CM

## 2022-10-10 ENCOUNTER — Inpatient Hospital Stay: Payer: BC Managed Care – PPO | Attending: Hematology

## 2022-10-10 ENCOUNTER — Inpatient Hospital Stay (HOSPITAL_BASED_OUTPATIENT_CLINIC_OR_DEPARTMENT_OTHER): Payer: BC Managed Care – PPO | Admitting: Hematology

## 2022-10-10 ENCOUNTER — Other Ambulatory Visit (HOSPITAL_COMMUNITY): Payer: Self-pay

## 2022-10-10 ENCOUNTER — Inpatient Hospital Stay: Payer: BC Managed Care – PPO

## 2022-10-10 ENCOUNTER — Encounter: Payer: Self-pay | Admitting: Hematology

## 2022-10-10 ENCOUNTER — Other Ambulatory Visit: Payer: Self-pay

## 2022-10-10 VITALS — BP 110/76 | HR 84 | Temp 97.9°F | Resp 17 | Ht 66.0 in | Wt 155.0 lb

## 2022-10-10 DIAGNOSIS — D6959 Other secondary thrombocytopenia: Secondary | ICD-10-CM | POA: Diagnosis not present

## 2022-10-10 DIAGNOSIS — D649 Anemia, unspecified: Secondary | ICD-10-CM | POA: Diagnosis not present

## 2022-10-10 DIAGNOSIS — G629 Polyneuropathy, unspecified: Secondary | ICD-10-CM | POA: Insufficient documentation

## 2022-10-10 DIAGNOSIS — Z7189 Other specified counseling: Secondary | ICD-10-CM

## 2022-10-10 DIAGNOSIS — C9000 Multiple myeloma not having achieved remission: Secondary | ICD-10-CM

## 2022-10-10 DIAGNOSIS — E1140 Type 2 diabetes mellitus with diabetic neuropathy, unspecified: Secondary | ICD-10-CM | POA: Insufficient documentation

## 2022-10-10 DIAGNOSIS — G609 Hereditary and idiopathic neuropathy, unspecified: Secondary | ICD-10-CM | POA: Diagnosis not present

## 2022-10-10 DIAGNOSIS — Z79899 Other long term (current) drug therapy: Secondary | ICD-10-CM | POA: Diagnosis not present

## 2022-10-10 DIAGNOSIS — Z5112 Encounter for antineoplastic immunotherapy: Secondary | ICD-10-CM | POA: Insufficient documentation

## 2022-10-10 DIAGNOSIS — Z5111 Encounter for antineoplastic chemotherapy: Secondary | ICD-10-CM

## 2022-10-10 LAB — CBC WITH DIFFERENTIAL (CANCER CENTER ONLY)
Abs Immature Granulocytes: 0 10*3/uL (ref 0.00–0.07)
Basophils Absolute: 0 10*3/uL (ref 0.0–0.1)
Basophils Relative: 1 %
Eosinophils Absolute: 0.1 10*3/uL (ref 0.0–0.5)
Eosinophils Relative: 1 %
HCT: 40.8 % (ref 39.0–52.0)
Hemoglobin: 13.3 g/dL (ref 13.0–17.0)
Immature Granulocytes: 0 %
Lymphocytes Relative: 39 %
Lymphs Abs: 2.4 10*3/uL (ref 0.7–4.0)
MCH: 24.9 pg — ABNORMAL LOW (ref 26.0–34.0)
MCHC: 32.6 g/dL (ref 30.0–36.0)
MCV: 76.3 fL — ABNORMAL LOW (ref 80.0–100.0)
Monocytes Absolute: 0.6 10*3/uL (ref 0.1–1.0)
Monocytes Relative: 9 %
Neutro Abs: 3.1 10*3/uL (ref 1.7–7.7)
Neutrophils Relative %: 50 %
Platelet Count: 174 10*3/uL (ref 150–400)
RBC: 5.35 MIL/uL (ref 4.22–5.81)
RDW: 17.2 % — ABNORMAL HIGH (ref 11.5–15.5)
WBC Count: 6.1 10*3/uL (ref 4.0–10.5)
nRBC: 0 % (ref 0.0–0.2)

## 2022-10-10 LAB — CMP (CANCER CENTER ONLY)
ALT: 15 U/L (ref 0–44)
AST: 11 U/L — ABNORMAL LOW (ref 15–41)
Albumin: 4 g/dL (ref 3.5–5.0)
Alkaline Phosphatase: 65 U/L (ref 38–126)
Anion gap: 6 (ref 5–15)
BUN: 13 mg/dL (ref 6–20)
CO2: 26 mmol/L (ref 22–32)
Calcium: 8.4 mg/dL — ABNORMAL LOW (ref 8.9–10.3)
Chloride: 108 mmol/L (ref 98–111)
Creatinine: 0.56 mg/dL — ABNORMAL LOW (ref 0.61–1.24)
GFR, Estimated: >60 mL/min (ref >60)
Glucose, Bld: 136 mg/dL — ABNORMAL HIGH (ref 70–99)
Potassium: 3.7 mmol/L (ref 3.5–5.1)
Sodium: 140 mmol/L (ref 135–145)
Total Bilirubin: 0.5 mg/dL (ref 0.3–1.2)
Total Protein: 6 g/dL — ABNORMAL LOW (ref 6.5–8.1)

## 2022-10-10 LAB — LACTATE DEHYDROGENASE: LDH: 114 U/L (ref 98–192)

## 2022-10-10 MED ORDER — ONDANSETRON HCL 8 MG PO TABS
8.0000 mg | ORAL_TABLET | Freq: Three times a day (TID) | ORAL | 0 refills | Status: DC | PRN
  Filled 2022-10-10: qty 30, 10d supply, fill #0

## 2022-10-10 MED ORDER — PROCHLORPERAZINE MALEATE 10 MG PO TABS
10.0000 mg | ORAL_TABLET | Freq: Four times a day (QID) | ORAL | 0 refills | Status: DC | PRN
  Filled 2022-10-10: qty 30, 8d supply, fill #0

## 2022-10-10 MED ORDER — ACYCLOVIR 400 MG PO TABS
400.0000 mg | ORAL_TABLET | Freq: Two times a day (BID) | ORAL | 11 refills | Status: DC
  Filled 2022-10-10: qty 60, 30d supply, fill #0

## 2022-10-10 MED ORDER — ASPIRIN 81 MG PO TBEC
81.0000 mg | DELAYED_RELEASE_TABLET | Freq: Every day | ORAL | 11 refills | Status: DC
  Filled 2022-10-10: qty 30, 30d supply, fill #0

## 2022-10-10 MED ORDER — B-12 1000 MCG PO CAPS
1.0000 | ORAL_CAPSULE | Freq: Every day | ORAL | 3 refills | Status: DC
  Filled 2022-10-10: qty 90, fill #0

## 2022-10-10 MED ORDER — GABAPENTIN 300 MG PO CAPS
300.0000 mg | ORAL_CAPSULE | Freq: Three times a day (TID) | ORAL | 3 refills | Status: DC
  Filled 2022-10-10: qty 90, 30d supply, fill #0

## 2022-10-10 MED ORDER — ZOLEDRONIC ACID 4 MG/100ML IV SOLN
4.0000 mg | Freq: Once | INTRAVENOUS | Status: AC
  Administered 2022-10-10: 4 mg via INTRAVENOUS
  Filled 2022-10-10: qty 100

## 2022-10-10 MED ORDER — SODIUM CHLORIDE 0.9 % IV SOLN: Freq: Once | INTRAVENOUS | Status: AC

## 2022-10-10 NOTE — Patient Instructions (Signed)

## 2022-10-10 NOTE — Progress Notes (Signed)
Per Dr. Irene Limbo OK to proceed with Zometa with calcium 8.4 mg/dL and corrected calcium 8.4 mg/dL today.

## 2022-10-15 LAB — MULTIPLE MYELOMA PANEL, SERUM
Albumin SerPl Elph-Mcnc: 3.6 g/dL (ref 2.9–4.4)
Albumin/Glob SerPl: 1.9 — ABNORMAL HIGH (ref 0.7–1.7)
Alpha 1: 0.2 g/dL (ref 0.0–0.4)
Alpha2 Glob SerPl Elph-Mcnc: 0.8 g/dL (ref 0.4–1.0)
B-Globulin SerPl Elph-Mcnc: 0.7 g/dL (ref 0.7–1.3)
Gamma Glob SerPl Elph-Mcnc: 0.4 g/dL (ref 0.4–1.8)
Globulin, Total: 2 g/dL — ABNORMAL LOW (ref 2.2–3.9)
IgA: 52 mg/dL — ABNORMAL LOW (ref 90–386)
IgG (Immunoglobin G), Serum: 499 mg/dL — ABNORMAL LOW (ref 603–1613)
IgM (Immunoglobulin M), Srm: 22 mg/dL (ref 20–172)
M Protein SerPl Elph-Mcnc: 0.1 g/dL — ABNORMAL HIGH
Total Protein ELP: 5.6 g/dL — ABNORMAL LOW (ref 6.0–8.5)

## 2022-10-17 ENCOUNTER — Encounter: Payer: Self-pay | Admitting: Hematology

## 2022-10-17 NOTE — Progress Notes (Signed)
[name]  HEMATOLOGY/ONCOLOGY CLINIC NOTE  Date of Service: 10/10/2022   Chief complaint - For continued evaluation and management of multiple myeloma  Current treatment-daratumumab/Revlimid/dexamethasone  INTERVAL HISTORY:  Donald Suarez is a 56 y.o. male is here for continued valuation and management of multiple myeloma. He has missed 6 to 8 weeks of treatment due to difficulty with getting in touch with him in scheduling his treatments.  He unfortunately also missed his appointment with the [facility] bone marrow transplant team. He notes he is back to work full-time 8 hours a day and has been feeling well. No focal bone pains.  No new fatigue.  Notes significant improvement in his neuropathy neuropathic symptoms of fVelcade and on gabapentin. No other acute new symptoms or limiting considerations. With the help of his [name] interpreter we discussed in detail need for compliance with follow-ups and his treatments. He also ran out of his Revlimid about 3 weeks ago and the company delivering the Revlimid has not been able to get the language translator to be able to deliver his medication.  My nurse is working on trying to get them to deliver the medication to the cancer center so we can give it to him.  Labs done today were discussed in detail with him  OBJECTIVE:  NAD  PHYSICAL EXAMINATION: .BP 110/76 (BP Location: Left Arm, Patient Position: Sitting)   Pulse 84   Temp 97.9 F (36.6 C) (Temporal)   Resp 17   Ht 5' 6" (1.676 m)   Wt 155 lb (70.3 kg)   SpO2 100%   BMI 25.02 kg/m  . NAD GENERAL:alert, in no acute distress and comfortable SKIN: no acute rashes, no significant lesions EYES: conjunctiva are pink and non-injected, sclera anicteric OROPHARYNX: MMM, no exudates, no oropharyngeal erythema or ulceration NECK: supple, no JVD LYMPH:  no palpable lymphadenopathy in the cervical, axillary or inguinal regions LUNGS: clear to auscultation b/l with normal respiratory  effort HEART: regular rate & rhythm ABDOMEN:  normoactive bowel sounds , non tender, not distended. Extremity: no pedal edema PSYCH: alert & oriented x 3 with fluent speech NEURO: no focal motor/sensory deficits    MEDICAL HISTORY:  Past Medical History:  Diagnosis Date   Anemia    Elevated ferritin 08/07/2022   Hypocalcemia 08/19/2022   In setting of multiple myeloma    Microalbuminuria 08/19/2022   Peripheral neuropathy 09/08/2022   Started 08/2022 A/w diabetes and revlimid usage Burning on top of left foot    SURGICAL HISTORY: Past Surgical History:  Procedure Laterality Date   APPENDECTOMY     [place] FLUORO GUIDE CV LINE RIGHT  05/15/2022   [place] PATIENT EVAL TECH 0-60 MINS  05/19/2022   [place] US GUIDE VASC ACCESS RIGHT  05/15/2022    SOCIAL HISTORY: Social History   Socioeconomic History   Marital status: Married    Spouse name: Not on file   Number of children: Not on file   Years of education: Not on file   Highest education level: Not on file  Occupational History   Not on file  Tobacco Use   Smoking status: Never   Smokeless tobacco: Never  Vaping Use   Vaping Use: Never used  Substance and Sexual Activity   Alcohol use: Never   Drug use: Never   Sexual activity: Not on file  Other Topics Concern   Not on file  Social History Narrative   Not on file   Social Determinants of Health   Financial Resource Strain:  Medium Risk (05/21/2022)   Overall Financial Resource Strain (CARDIA)    Difficulty of Paying Living Expenses: Somewhat hard  Food Insecurity: Food Insecurity Present (05/21/2022)   Hunger Vital Sign    Worried About Running Out of Food in the Last Year: Sometimes true    Ran Out of Food in the Last Year: Sometimes true  Transportation Needs: No Transportation Needs (05/21/2022)   PRAPARE - Transportation    Lack of Transportation (Medical): No    Lack of Transportation (Non-Medical): No  Physical Activity: Not on file  Stress: Not on file  Social  Connections: Not on file  Intimate Partner Violence: Not on file    FAMILY HISTORY: No family history on file.  ALLERGIES:  has No Known Allergies.  MEDICATIONS:  Reviewed REVIEW OF SYSTEMS:   10 Point review of Systems was done is negative except as noted above.   LABORATORY DATA:  I have reviewed the data as listed  .    Latest Ref Rng & Units 10/10/2022    1:04 PM 09/11/2022    1:55 PM 08/15/2022    9:45 AM  CBC  WBC 4.0 - 10.5 K/uL 6.1  6.7  5.6   Hemoglobin 13.0 - 17.0 g/dL 13.3  14.0  12.5   Hematocrit 39.0 - 52.0 % 40.8  43.2  38.5   Platelets 150 - 400 K/uL 174  163  127     .    Latest Ref Rng & Units 10/10/2022    1:04 PM 09/11/2022    1:55 PM 08/15/2022    9:45 AM  CMP  Glucose 70 - 99 mg/dL 136  151  129   BUN 6 - 20 mg/dL 13  12  12   Creatinine 0.61 - 1.24 mg/dL 0.56  0.64  0.55   Sodium 135 - 145 mmol/L 140  138  140   Potassium 3.5 - 5.1 mmol/L 3.7  3.2  4.0   Chloride 98 - 111 mmol/L 108  105  109   CO2 22 - 32 mmol/L 26  27  27   Calcium 8.9 - 10.3 mg/dL 8.4  9.1  8.7   Total Protein 6.5 - 8.1 g/dL 6.0  6.5  6.0   Total Bilirubin 0.3 - 1.2 mg/dL 0.5  0.6  0.5   Alkaline Phos 38 - 126 U/L 65  98  85   AST 15 - 41 U/L 11  13  12   ALT 0 - 44 U/L 15  13  16    . Lab Results  Component Value Date   LDH 114 10/10/2022      RADIOGRAPHIC STUDIES: I have personally reviewed the radiological images as listed and agreed with the findings in the report. DG Foot Complete Right  Result Date: 09/18/2022 CLINICAL DATA:  History of multiple myeloma with right foot pain. EXAM: RIGHT FOOT COMPLETE - 3+ VIEW COMPARISON:  None Available. FINDINGS: There is no evidence of fracture or dislocation. Mild degenerative changes are seen along the dorsal aspect of the proximal right foot. Soft tissues are unremarkable. IMPRESSION: No acute osseous abnormality. Electronically Signed   By: Thaddeus  Houston M.D.   On: 09/18/2022 20:58   DG Foot Complete Left  Result  Date: 09/18/2022 CLINICAL DATA:  History of multiple myeloma with foot pain. EXAM: LEFT FOOT - COMPLETE 3+ VIEW COMPARISON:  None Available. FINDINGS: There is no evidence of an acute fracture or dislocation. Mild degenerative changes seen along the dorsal aspect of the   proximal left foot. Soft tissues are unremarkable. IMPRESSION: No acute osseous abnormality. Electronically Signed   By: Thaddeus  Houston M.D.   On: 09/18/2022 20:56    ASSESSMENT & PLAN:   1) Progressive IgG Lambda Multiple myeloma not on treatment for more than 1 year due to patient's lack of follow-up. Patient diagnosed December 2021 and received 1 cycle of CyBorD. Had previously presented with anemia renal insufficiency and hyperviscosity symptoms. Bx- 90% plasma cells Initial M spike 8.03g/dl Del(1p): Not Detected  Dup(1q): Not Detected  Gains(15): DETECTED  Gains(5 and 9): Not Detected  Del(13q)/-13: DETECTED  Del(17p)(TP53): Not Detected  IGH(Rearrangement): SEE BELOW   IgH complex: t(4;14): DETECTED  t(11;14): Not Detected  t(14;16): Not Detected  t(14;20): Not Detected   Cytogenetics: Normal male karyotype.   -Labs from 05/13/2022-beta-2 microglobulin 4.4, LDH 197, lambda free light chain 77.1, kappa, lambda light chain ratio 0.08, multiple myeloma panel pending.  UPEP ordered but not yet collected. 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  Due to multiple myeloma PLAN Labs done today were discussed in detail with the  patient. CBC is within normal limits with normalization of his hemoglobin levels up to 13.3 normal WBC count and platelets CMP shows stable renal function and calcium levels Myeloma panel shows his M spike is down to 0.1 g/dL.  Patient missed nearly 4 to 6 weeks of treatment and has also been off his Revlimid for 3 weeks due to known delivery difficulties with coordinating his delivery. His language barriers are related to delayed cares and we discussed with the help of the interpreter the importance of making sure he has his treatment calendars and does not miss any of his treatments.  His neuropathy symptoms have nearly resolved at this time.  We will continue current dose of gabapentin.  We will continue to hold off on his Velcade.  At this time we will continue daratumumab Revlimid dexamethasone treatment pending final decision for his transplantation. Continue Zometa every 4 weeks.  No new clinical issues noted.  Follow-up -Plz schedule Daratumumab with labs every 2 weeks x 6 -MD visit in 4 weeks -continue Zometa every 4 weeks x 6  The total time spent in the appointment was 45 minutes*.  All of the patient's questions were answered with apparent satisfaction. The patient knows to call the clinic with any problems, questions or concerns.   Gautam Kale MD MS AAHIVMS SCH CTH Hematology/Oncology Physician [facility]  .*Total Encounter Time as defined by the Centers for Medicare and Medicaid Services includes, in addition to the face-to-face time of a patient visit (documented in the note above) non-face-to-face time: obtaining and reviewing outside history, ordering and reviewing medications, tests or procedures, care coordination (communications with other health care professionals or caregivers) and documentation in the medical record.   

## 2022-10-18 ENCOUNTER — Other Ambulatory Visit (HOSPITAL_COMMUNITY): Payer: Self-pay

## 2022-10-21 ENCOUNTER — Other Ambulatory Visit: Payer: Self-pay

## 2022-10-21 DIAGNOSIS — C9000 Multiple myeloma not having achieved remission: Secondary | ICD-10-CM

## 2022-10-21 MED ORDER — LENALIDOMIDE 25 MG PO CAPS: ORAL_CAPSULE | ORAL | 0 refills | Status: DC

## 2022-10-23 ENCOUNTER — Ambulatory Visit: Payer: BC Managed Care – PPO | Admitting: Family Medicine

## 2022-10-23 MED FILL — Dexamethasone Sodium Phosphate Inj 100 MG/10ML: INTRAMUSCULAR | Qty: 2 | Status: AC

## 2022-10-24 ENCOUNTER — Inpatient Hospital Stay: Payer: BC Managed Care – PPO

## 2022-10-24 ENCOUNTER — Other Ambulatory Visit: Payer: Self-pay

## 2022-10-24 VITALS — BP 127/84 | HR 79 | Temp 98.2°F | Resp 17 | Wt 155.2 lb

## 2022-10-24 DIAGNOSIS — C9000 Multiple myeloma not having achieved remission: Secondary | ICD-10-CM | POA: Diagnosis not present

## 2022-10-24 DIAGNOSIS — Z7189 Other specified counseling: Secondary | ICD-10-CM

## 2022-10-24 LAB — CBC WITH DIFFERENTIAL (CANCER CENTER ONLY)
Abs Immature Granulocytes: 0.01 10*3/uL (ref 0.00–0.07)
Basophils Absolute: 0 10*3/uL (ref 0.0–0.1)
Basophils Relative: 0 %
Eosinophils Absolute: 0.1 10*3/uL (ref 0.0–0.5)
Eosinophils Relative: 1 %
HCT: 42.2 % (ref 39.0–52.0)
Hemoglobin: 13.7 g/dL (ref 13.0–17.0)
Immature Granulocytes: 0 %
Lymphocytes Relative: 44 %
Lymphs Abs: 3.2 10*3/uL (ref 0.7–4.0)
MCH: 25.2 pg — ABNORMAL LOW (ref 26.0–34.0)
MCHC: 32.5 g/dL (ref 30.0–36.0)
MCV: 77.7 fL — ABNORMAL LOW (ref 80.0–100.0)
Monocytes Absolute: 0.5 10*3/uL (ref 0.1–1.0)
Monocytes Relative: 7 %
Neutro Abs: 3.4 10*3/uL (ref 1.7–7.7)
Neutrophils Relative %: 48 %
Platelet Count: 202 10*3/uL (ref 150–400)
RBC: 5.43 MIL/uL (ref 4.22–5.81)
RDW: 16.8 % — ABNORMAL HIGH (ref 11.5–15.5)
WBC Count: 7.2 10*3/uL (ref 4.0–10.5)
nRBC: 0 % (ref 0.0–0.2)

## 2022-10-24 MED ORDER — LENALIDOMIDE 25 MG PO CAPS: ORAL_CAPSULE | ORAL | 0 refills | Status: DC

## 2022-10-24 MED ORDER — ACETAMINOPHEN 325 MG PO TABS
650.0000 mg | ORAL_TABLET | Freq: Once | ORAL | Status: AC
  Administered 2022-10-24: 650 mg via ORAL
  Filled 2022-10-24: qty 2

## 2022-10-24 MED ORDER — SODIUM CHLORIDE 0.9 % IV SOLN
20.0000 mg | Freq: Once | INTRAVENOUS | Status: AC
  Administered 2022-10-24: 20 mg via INTRAVENOUS
  Filled 2022-10-24: qty 20

## 2022-10-24 MED ORDER — DIPHENHYDRAMINE HCL 25 MG PO CAPS
50.0000 mg | ORAL_CAPSULE | Freq: Once | ORAL | Status: AC
  Administered 2022-10-24: 50 mg via ORAL
  Filled 2022-10-24: qty 2

## 2022-10-24 MED ORDER — SODIUM CHLORIDE 0.9 % IV SOLN: Freq: Once | INTRAVENOUS | Status: AC

## 2022-10-24 MED ORDER — MONTELUKAST SODIUM 10 MG PO TABS
10.0000 mg | ORAL_TABLET | Freq: Once | ORAL | Status: AC
  Administered 2022-10-24: 10 mg via ORAL
  Filled 2022-10-24: qty 1

## 2022-10-24 MED ORDER — FAMOTIDINE IN NACL 20-0.9 MG/50ML-% IV SOLN
20.0000 mg | Freq: Once | INTRAVENOUS | Status: AC
  Administered 2022-10-24: 20 mg via INTRAVENOUS
  Filled 2022-10-24: qty 50

## 2022-10-24 MED ORDER — SODIUM CHLORIDE 0.9 % IV SOLN
16.0000 mg/kg | Freq: Once | INTRAVENOUS | Status: AC
  Administered 2022-10-24: 1100 mg via INTRAVENOUS
  Filled 2022-10-24: qty 40

## 2022-10-24 NOTE — Patient Instructions (Signed)
[facility] ONCOLOGY  Discharge Instructions: Thank you for choosing [facility] to provide your oncology and hematology care.   If you have a lab appointment with the [facility], please go directly to the [facility] and check in at the registration area.   Wear comfortable clothing and clothing appropriate for easy access to any Portacath or PICC line.   We strive to give you quality time with your provider. You may need to reschedule your appointment if you arrive late (15 or more minutes).  Arriving late affects you and other patients whose appointments are after yours.  Also, if you miss three or more appointments without notifying the office, you may be dismissed from the clinic at the provider's discretion.      For prescription refill requests, have your pharmacy contact our office and allow 72 hours for refills to be completed.    Today you received the following chemotherapy and/or immunotherapy agent: Daratumumab   To help prevent nausea and vomiting after your treatment, we encourage you to take your nausea medication as directed.  BELOW ARE SYMPTOMS THAT SHOULD BE REPORTED IMMEDIATELY: *FEVER GREATER THAN 100.4 F (38 C) OR HIGHER *CHILLS OR SWEATING *NAUSEA AND VOMITING THAT IS NOT CONTROLLED WITH YOUR NAUSEA MEDICATION *UNUSUAL SHORTNESS OF BREATH *UNUSUAL BRUISING OR BLEEDING *URINARY PROBLEMS (pain or burning when urinating, or frequent urination) *BOWEL PROBLEMS (unusual diarrhea, constipation, pain near the anus) TENDERNESS IN MOUTH AND THROAT WITH OR WITHOUT PRESENCE OF ULCERS (sore throat, sores in mouth, or a toothache) UNUSUAL RASH, SWELLING OR PAIN  UNUSUAL VAGINAL DISCHARGE OR ITCHING   Items with * indicate a potential emergency and should be followed up as soon as possible or go to the Emergency Department if any problems should occur.  Please show the CHEMOTHERAPY ALERT CARD or IMMUNOTHERAPY ALERT CARD at check-in to  the Emergency Department and triage nurse.  Should you have questions after your visit or need to cancel or reschedule your appointment, please contact [facility]  Dept: [phone]  and follow the prompts.  Office hours are 8:00 a.m. to 4:30 p.m. Monday - Friday. Please note that voicemails left after 4:00 p.m. may not be returned until the following business day.  We are closed weekends and major holidays. You have access to a nurse at all times for urgent questions. Please call the main number to the clinic Dept: [phone] and follow the prompts.   For any non-urgent questions, you may also contact your provider using MyChart. We now offer e-Visits for anyone 46 and older to request care online for non-urgent symptoms. For details visit [email or web address].   Also download the MyChart app! Go to the app store, search "MyChart", open the app, select [facility], and log in with your MyChart username and password.  Masks are optional in the cancer centers. If you would like for your care team to wear a mask while they are taking care of you, please let them know. You may have one support person who is at least 55 years old accompany you for your appointments.

## 2022-10-31 ENCOUNTER — Inpatient Hospital Stay: Payer: BC Managed Care – PPO

## 2022-11-06 MED FILL — Dexamethasone Sodium Phosphate Inj 100 MG/10ML: INTRAMUSCULAR | Qty: 2 | Status: AC

## 2022-11-07 ENCOUNTER — Inpatient Hospital Stay: Payer: BC Managed Care – PPO | Admitting: Hematology

## 2022-11-07 ENCOUNTER — Inpatient Hospital Stay: Payer: BC Managed Care – PPO | Attending: Hematology

## 2022-11-07 ENCOUNTER — Inpatient Hospital Stay: Payer: BC Managed Care – PPO

## 2022-11-07 VITALS — BP 140/89 | HR 86 | Temp 97.7°F | Resp 17 | Wt 154.4 lb

## 2022-11-07 VITALS — BP 132/79 | HR 78 | Temp 97.9°F | Resp 18

## 2022-11-07 DIAGNOSIS — G609 Hereditary and idiopathic neuropathy, unspecified: Secondary | ICD-10-CM

## 2022-11-07 DIAGNOSIS — C9000 Multiple myeloma not having achieved remission: Secondary | ICD-10-CM | POA: Diagnosis not present

## 2022-11-07 DIAGNOSIS — Z7189 Other specified counseling: Secondary | ICD-10-CM | POA: Diagnosis not present

## 2022-11-07 DIAGNOSIS — R809 Proteinuria, unspecified: Secondary | ICD-10-CM | POA: Diagnosis not present

## 2022-11-07 DIAGNOSIS — Z5112 Encounter for antineoplastic immunotherapy: Secondary | ICD-10-CM | POA: Diagnosis not present

## 2022-11-07 DIAGNOSIS — G629 Polyneuropathy, unspecified: Secondary | ICD-10-CM | POA: Diagnosis not present

## 2022-11-07 DIAGNOSIS — D696 Thrombocytopenia, unspecified: Secondary | ICD-10-CM | POA: Diagnosis not present

## 2022-11-07 DIAGNOSIS — Z23 Encounter for immunization: Secondary | ICD-10-CM | POA: Insufficient documentation

## 2022-11-07 DIAGNOSIS — D649 Anemia, unspecified: Secondary | ICD-10-CM | POA: Diagnosis not present

## 2022-11-07 LAB — COMPREHENSIVE METABOLIC PANEL
ALT: 15 U/L (ref 0–44)
AST: 12 U/L — ABNORMAL LOW (ref 15–41)
Albumin: 4.3 g/dL (ref 3.5–5.0)
Alkaline Phosphatase: 55 U/L (ref 38–126)
Anion gap: 7 (ref 5–15)
BUN: 12 mg/dL (ref 6–20)
CO2: 28 mmol/L (ref 22–32)
Calcium: 9 mg/dL (ref 8.9–10.3)
Chloride: 107 mmol/L (ref 98–111)
Creatinine, Ser: 0.64 mg/dL (ref 0.61–1.24)
GFR, Estimated: >60 mL/min (ref >60)
Glucose, Bld: 105 mg/dL — ABNORMAL HIGH (ref 70–99)
Potassium: 3.4 mmol/L — ABNORMAL LOW (ref 3.5–5.1)
Sodium: 142 mmol/L (ref 135–145)
Total Bilirubin: 0.7 mg/dL (ref 0.3–1.2)
Total Protein: 7 g/dL (ref 6.5–8.1)

## 2022-11-07 LAB — CBC WITH DIFFERENTIAL (CANCER CENTER ONLY)
Abs Immature Granulocytes: 0.02 10*3/uL (ref 0.00–0.07)
Basophils Absolute: 0 10*3/uL (ref 0.0–0.1)
Basophils Relative: 0 %
Eosinophils Absolute: 0.1 10*3/uL (ref 0.0–0.5)
Eosinophils Relative: 1 %
HCT: 42.2 % (ref 39.0–52.0)
Hemoglobin: 14 g/dL (ref 13.0–17.0)
Immature Granulocytes: 0 %
Lymphocytes Relative: 28 %
Lymphs Abs: 3 10*3/uL (ref 0.7–4.0)
MCH: 25.7 pg — ABNORMAL LOW (ref 26.0–34.0)
MCHC: 33.2 g/dL (ref 30.0–36.0)
MCV: 77.4 fL — ABNORMAL LOW (ref 80.0–100.0)
Monocytes Absolute: 0.6 10*3/uL (ref 0.1–1.0)
Monocytes Relative: 5 %
Neutro Abs: 6.9 10*3/uL (ref 1.7–7.7)
Neutrophils Relative %: 66 %
Platelet Count: 200 10*3/uL (ref 150–400)
RBC: 5.45 MIL/uL (ref 4.22–5.81)
RDW: 16 % — ABNORMAL HIGH (ref 11.5–15.5)
WBC Count: 10.6 10*3/uL — ABNORMAL HIGH (ref 4.0–10.5)
nRBC: 0 % (ref 0.0–0.2)

## 2022-11-07 MED ORDER — INFLUENZA VAC SPLIT QUAD 0.5 ML IM SUSY
0.5000 mL | PREFILLED_SYRINGE | Freq: Once | INTRAMUSCULAR | Status: AC
  Administered 2022-11-07: 0.5 mL via INTRAMUSCULAR
  Filled 2022-11-07: qty 0.5

## 2022-11-07 MED ORDER — SODIUM CHLORIDE 0.9 % IV SOLN: Freq: Once | INTRAVENOUS | Status: DC

## 2022-11-07 MED ORDER — ACYCLOVIR 400 MG PO TABS: 400.0000 mg | ORAL_TABLET | Freq: Two times a day (BID) | ORAL | 11 refills | Status: DC

## 2022-11-07 MED ORDER — GABAPENTIN 300 MG PO CAPS: 300.0000 mg | ORAL_CAPSULE | Freq: Three times a day (TID) | ORAL | 5 refills | Status: DC

## 2022-11-07 MED ORDER — FAMOTIDINE IN NACL 20-0.9 MG/50ML-% IV SOLN
20.0000 mg | Freq: Once | INTRAVENOUS | Status: AC
  Administered 2022-11-07: 20 mg via INTRAVENOUS
  Filled 2022-11-07: qty 50

## 2022-11-07 MED ORDER — SODIUM CHLORIDE 0.9 % IV SOLN
12.0000 mg | Freq: Once | INTRAVENOUS | Status: AC
  Administered 2022-11-07: 12 mg via INTRAVENOUS
  Filled 2022-11-07: qty 1.2

## 2022-11-07 MED ORDER — ACETAMINOPHEN 325 MG PO TABS
650.0000 mg | ORAL_TABLET | Freq: Once | ORAL | Status: AC
  Administered 2022-11-07: 650 mg via ORAL
  Filled 2022-11-07: qty 2

## 2022-11-07 MED ORDER — ZOLEDRONIC ACID 4 MG/100ML IV SOLN
4.0000 mg | Freq: Once | INTRAVENOUS | Status: AC
  Administered 2022-11-07: 4 mg via INTRAVENOUS
  Filled 2022-11-07: qty 100

## 2022-11-07 MED ORDER — MONTELUKAST SODIUM 10 MG PO TABS
10.0000 mg | ORAL_TABLET | Freq: Once | ORAL | Status: AC
  Administered 2022-11-07: 10 mg via ORAL
  Filled 2022-11-07: qty 1

## 2022-11-07 MED ORDER — SODIUM CHLORIDE 0.9 % IV SOLN
16.0000 mg/kg | Freq: Once | INTRAVENOUS | Status: AC
  Administered 2022-11-07: 1100 mg via INTRAVENOUS
  Filled 2022-11-07: qty 40

## 2022-11-07 MED ORDER — DIPHENHYDRAMINE HCL 25 MG PO CAPS
50.0000 mg | ORAL_CAPSULE | Freq: Once | ORAL | Status: AC
  Administered 2022-11-07: 50 mg via ORAL
  Filled 2022-11-07: qty 2

## 2022-11-07 MED ORDER — SODIUM CHLORIDE 0.9 % IV SOLN: Freq: Once | INTRAVENOUS | Status: AC

## 2022-11-07 NOTE — Patient Instructions (Addendum)
[facility] ONCOLOGY  Discharge Instructions: Thank you for choosing [facility] to provide your oncology and hematology care.   If you have a lab appointment with the [facility], please go directly to the [facility] and check in at the registration area.   Wear comfortable clothing and clothing appropriate for easy access to any Portacath or PICC line.   We strive to give you quality time with your provider. You may need to reschedule your appointment if you arrive late (15 or more minutes).  Arriving late affects you and other patients whose appointments are after yours.  Also, if you miss three or more appointments without notifying the office, you may be dismissed from the clinic at the provider's discretion.      For prescription refill requests, have your pharmacy contact our office and allow 72 hours for refills to be completed.    Today you received the following chemotherapy and/or immunotherapy agent: Daratumumab and zometa   To help prevent nausea and vomiting after your treatment, we encourage you to take your nausea medication as directed.  BELOW ARE SYMPTOMS THAT SHOULD BE REPORTED IMMEDIATELY: *FEVER GREATER THAN 100.4 F (38 C) OR HIGHER *CHILLS OR SWEATING *NAUSEA AND VOMITING THAT IS NOT CONTROLLED WITH YOUR NAUSEA MEDICATION *UNUSUAL SHORTNESS OF BREATH *UNUSUAL BRUISING OR BLEEDING *URINARY PROBLEMS (pain or burning when urinating, or frequent urination) *BOWEL PROBLEMS (unusual diarrhea, constipation, pain near the anus) TENDERNESS IN MOUTH AND THROAT WITH OR WITHOUT PRESENCE OF ULCERS (sore throat, sores in mouth, or a toothache) UNUSUAL RASH, SWELLING OR PAIN  UNUSUAL VAGINAL DISCHARGE OR ITCHING   Items with * indicate a potential emergency and should be followed up as soon as possible or go to the Emergency Department if any problems should occur.  Please show the CHEMOTHERAPY ALERT CARD or IMMUNOTHERAPY ALERT CARD at  check-in to the Emergency Department and triage nurse.  Should you have questions after your visit or need to cancel or reschedule your appointment, please contact [facility]  Dept: [phone]  and follow the prompts.  Office hours are 8:00 a.m. to 4:30 p.m. Monday - Friday. Please note that voicemails left after 4:00 p.m. may not be returned until the following business day.  We are closed weekends and major holidays. You have access to a nurse at all times for urgent questions. Please call the main number to the clinic Dept: [phone] and follow the prompts.   For any non-urgent questions, you may also contact your provider using MyChart. We now offer e-Visits for anyone 62 and older to request care online for non-urgent symptoms. For details visit [email or web address].   Also download the MyChart app! Go to the app store, search "MyChart", open the app, select [facility], and log in with your MyChart username and password.  Masks are optional in the cancer centers. If you would like for your care team to wear a mask while they are taking care of you, please let them know. You may have one support person who is at least 55 years old accompany you for your appointments.

## 2022-11-07 NOTE — Progress Notes (Signed)
Calcium level is 9.0 (8.76 corrected) today- ok to proceed with zometa per Dr. Irene Limbo

## 2022-11-13 ENCOUNTER — Encounter: Payer: Self-pay | Admitting: Hematology

## 2022-11-13 NOTE — Progress Notes (Signed)
[name]  HEMATOLOGY/ONCOLOGY CLINIC NOTE  Date of Service: .11/07/2022    Chief complaint - Follow-up for continued evaluation and management of high risk multiple myeloma  Current treatment-daratumumab/Revlimid/dexamethasone Has not f/u on and does not appear keen to consider Melphalan/HDT-AUTOHSCT  INTERVAL HISTORY:  Jaiceon Vangilder is a 56 y.o. male is here with his [name] interpreter for continued evaluation and management of his multiple myeloma. He notes no acute new symptoms since his last clinic visit. He notes no new toxicities with his daratumumab and is tolerating this well. He still has not received his Revlimid from the pharmaceutical company since they did not have an interpreter for his language. My nurse and pharmacist have called the drug company to try to have his medication delivered to our cancer center so we could dispense it to him.  This is also pending. He notes some grade 1 neuropathy which is more prominent since he ran out of gabapentin and has not been taking this.  He also ran out of acyclovir and this was refilled. He is working full-time and notes good energy levels.  No lightheadedness or dizziness.  Visual blurring is nearly completely cleared.  He notes his diabetes is well controlled. Labs done today were discussed in detail with the patient.  OBJECTIVE:  NAD  PHYSICAL EXAMINATION: .BP 110/76 (BP Location: Left Arm, Patient Position: Sitting)   Pulse 84   Temp 97.9 F (36.6 C) (Temporal)   Resp 17   Ht _0  (1.676 m)   Wt 155 lb (70.3 kg)   SpO2 100%   BMI 25.02 kg/m  GENERAL:alert, in no acute distress and comfortable SKIN: no acute rashes, no significant lesions EYES: conjunctiva are pink and non-injected, sclera anicteric OROPHARYNX: MMM, no exudates, no oropharyngeal erythema or ulceration NECK: supple, no JVD LYMPH:  no palpable lymphadenopathy in the cervical, axillary or inguinal regions LUNGS: clear to auscultation b/l with normal  respiratory effort HEART: regular rate & rhythm ABDOMEN:  normoactive bowel sounds , non tender, not distended. Extremity: no pedal edema PSYCH: alert & oriented x 3 with fluent speech NEURO: no focal motor/sensory deficits  MEDICAL HISTORY:  Past Medical History:  Diagnosis Date   Anemia    Elevated ferritin 08/07/2022   Hypocalcemia 08/19/2022   In setting of multiple myeloma    Microalbuminuria 08/19/2022   Peripheral neuropathy 09/08/2022   Started 08/2022 A/w diabetes and revlimid usage Burning on top of left foot    SURGICAL HISTORY: Past Surgical History:  Procedure Laterality Date   APPENDECTOMY     [place] FLUORO GUIDE CV LINE RIGHT  05/15/2022   [facility] PATIENT EVAL TECH 0-60 MINS  05/19/2022   Ayaz US GUIDE VASC ACCESS RIGHT  05/15/2022    SOCIAL HISTORY: Social History   Socioeconomic History   Marital status: Married    Spouse name: Not on file   Number of children: Not on file   Years of education: Not on file   Highest education level: Not on file  Occupational History   Not on file  Tobacco Use   Smoking status: Never   Smokeless tobacco: Never  Vaping Use   Vaping Use: Never used  Substance and Sexual Activity   Alcohol use: Never   Drug use: Never   Sexual activity: Not on file  Other Topics Concern   Not on file  Social History Narrative   Not on file   Social Determinants of Health   Financial Resource Strain: Medium Risk (05/21/2022)  Overall Financial Resource Strain (CARDIA)    Difficulty of Paying Living Expenses: Somewhat hard  Food Insecurity: Food Insecurity Present (05/21/2022)   Hunger Vital Sign    Worried About Running Out of Food in the Last Year: Sometimes true    Ran Out of Food in the Last Year: Sometimes true  Transportation Needs: No Transportation Needs (05/21/2022)   PRAPARE - Hydrologist (Medical): No    Lack of Transportation (Non-Medical): No  Physical Activity: Not on file  Stress: Not on file   Social Connections: Not on file  Intimate Partner Violence: Not on file    FAMILY HISTORY: No family history on file.  ALLERGIES:  has No Known Allergies.  MEDICATIONS:  . Allergies as of 11/07/2022   No Known Allergies      Medication List        Accurate as of November 07, 2022 11:59 PM. If you have any questions, ask your nurse or doctor.          Accu-Chek Guide test strip Generic drug: glucose blood Use to test blood sugars up to 4 times daily as directed.   Accu-Chek Guide w/Device Kit Use to test blood sugars up to 4 times daily as directed.   Accu-Chek Softclix Lancets lancets Use to test blood sugars up to 4 times daily as directed.   acyclovir 400 MG tablet Commonly known as: ZOVIRAX Take 1 tablet (400 mg total) by mouth 2 (two) times daily.   aspirin EC 81 MG tablet Take 1 tablet (81 mg total) by mouth daily. Swallow whole.   B-12 1000 MCG Caps Take 1 tablet by mouth daily.   FreeStyle Libre 3 Sensor Misc 1 Application by Does not apply route every 14 (fourteen) days. Place 1 sensor on the skin every 14 days. Use to check glucose continuously   gabapentin 300 MG capsule Commonly known as: NEURONTIN Take 1 capsule (300 mg total) by mouth 3 (three) times daily.   lenalidomide 25 MG capsule Commonly known as: REVLIMID TAKE 1 CAPSULE BY MOUTH 1 TIME A DAY FOR 21 DAYS ON THEN 7 DAYS OFF   metFORMIN 500 MG tablet Commonly known as: GLUCOPHAGE Take 1 tablet (500 mg total) by mouth 2 (two) times daily with a meal. To start take just half tablet twice daily before meals for 3 days   ondansetron 8 MG tablet Commonly known as: Zofran Take 1 tablet (8 mg total) by mouth every 8 (eight) hours as needed for nausea or vomiting.   prochlorperazine 10 MG tablet Commonly known as: COMPAZINE Take 1 tablet (10 mg total) by mouth every 6 (six) hours as needed for nausea or vomiting.         REVIEW OF SYSTEMS:   10 Point review of Systems was done is  negative except as noted above.   LABORATORY DATA:  I have reviewed the data as listed  .    Latest Ref Rng & Units 11/07/2022    8:57 AM 10/24/2022   10:41 AM 10/10/2022    1:04 PM  CBC  WBC 4.0 - 10.5 K/uL 10.6  7.2  6.1   Hemoglobin 13.0 - 17.0 g/dL 14.0  13.7  13.3   Hematocrit 39.0 - 52.0 % 42.2  42.2  40.8   Platelets 150 - 400 K/uL 200  202  174     .    Latest Ref Rng & Units 11/07/2022   11:13 AM 10/10/2022    1:04  PM 09/11/2022    1:55 PM  CMP  Glucose 70 - 99 mg/dL 105  136  151   BUN 6 - 20 mg/dL _0 Creatinine 0.61 - 1.24 mg/dL 0.64  0.56  0.64   Sodium 135 - 145 mmol/L 142  140  138   Potassium 3.5 - 5.1 mmol/L 3.4  3.7  3.2   Chloride 98 - 111 mmol/L 107  108  105   CO2 22 - 32 mmol/L _1 Calcium 8.9 - 10.3 mg/dL 9.0  8.4  9.1   Total Protein 6.5 - 8.1 g/dL 7.0  6.0  6.5   Total Bilirubin 0.3 - 1.2 mg/dL 0.7  0.5  0.6   Alkaline Phos 38 - 126 U/L 55  65  98   AST 15 - 41 U/L _2 ALT 0 - 44 U/L _3 . Lab Results  Component Value Date   LDH 114 10/10/2022      RADIOGRAPHIC STUDIES: I have personally reviewed the radiological images as listed and agreed with the findings in the report. DG Foot Complete Right  Result Date: 09/18/2022 CLINICAL DATA:  History of multiple myeloma with right foot pain. EXAM: RIGHT FOOT COMPLETE - 3+ VIEW COMPARISON:  None Available. FINDINGS: There is no evidence of fracture or dislocation. Mild degenerative changes are seen along the dorsal aspect of the proximal right foot. Soft tissues are unremarkable. IMPRESSION: No acute osseous abnormality. Electronically Signed   By: Virgina Norfolk M.D.   On: 09/18/2022 20:58   DG Foot Complete Left  Result Date: 09/18/2022 CLINICAL DATA:  History of multiple myeloma with foot pain. EXAM: LEFT FOOT - COMPLETE 3+ VIEW COMPARISON:  None Available. FINDINGS: There is no evidence of an acute fracture or dislocation. Mild degenerative changes seen  along the dorsal aspect of the proximal left foot. Soft tissues are unremarkable. IMPRESSION: No acute osseous abnormality. Electronically Signed   By: Virgina Norfolk M.D.   On: 09/18/2022 20:56    ASSESSMENT & PLAN:   1) R-ISS stage III high risk IgG Lambda Multiple myeloma not on treatment for more than 1 year due to patient's lack of follow-up. Patient diagnosed December 2021 and received 1 cycle of CyBorD. Had previously presented with anemia renal insufficiency and hyperviscosity symptoms. Bx- 90% plasma cells Initial M spike 8.03g/dl Del(1p): Not Detected  Dup(1q): Not Detected  Gains(15): DETECTED  Gains(5 and 9): Not Detected  Del(13q)/-13: DETECTED  Del(17p)(TP53): Not Detected  IGH(Rearrangement): SEE BELOW   IgH complex: t(4;14): DETECTED  t(11;14): Not Detected  t(14;16): Not Detected  t(14;20): Not Detected   Cytogenetics: Normal male karyotype.   -Labs from 05/13/2022-beta-2 microglobulin 4.4, LDH 197, lambda free light chain 77.1, kappa, lambda light chain ratio 0.08, multiple myeloma panel pending.  UPEP ordered but not yet collected. 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  Due to multiple myeloma PLAN Labs done today were discussed in detail with the patient  He  notes no acute new symptoms.  Has good energy levels and is working full-time. No new focal bone pains no new dental issues. We will continue his daratumumab every 2 weeks.  He has had no toxicities to this. Still has  not received his Revlimid from his pharmaceutical company due to language barriers.  Our pharmacist and nurse are working on this to try to see if it can be delivered to the cancer center. Continue Zometa every 4 weeks.  No new dental issues at this time. He notes more prominent neuropathy symptoms which are grade 1 especially in the context of having run out of gabapentin.  We will refill his gabapentin and he was recommended to not stop this without asking [name] and pick up his refills on time. At this time we want to continue his daratumumab Revlimid dexamethasone regimen with monthly Zometa. -He is disinclined to pursue consideration of autologous bone marrow transplantation at this time and has missed several appointments with the [facility] transplant team.  Follow-up -Plz schedule Daratumumab with labs every 2 weeks x 6 -per integrated scheduling  -MD visit in 4 weeks -continue Zometa every 4 weeks x 6   The total time spent in the appointment was 30 minutes*.  All of the patient's questions were answered with apparent satisfaction. The patient knows to call the clinic with any problems, questions or concerns.   Sullivan Lone MD MS AAHIVMS [facility] Hematology/Oncology Physician [facility]  .*Total Encounter Time as defined by the Centers for Medicare and Medicaid Services includes, in addition to the face-to-face time of a patient visit (documented in the note above) non-face-to-face time: obtaining and reviewing outside history, ordering and reviewing medications, tests or procedures, care coordination (communications with other health care professionals or caregivers) and documentation in the medical record.

## 2022-11-21 ENCOUNTER — Other Ambulatory Visit: Payer: Self-pay

## 2022-11-21 ENCOUNTER — Inpatient Hospital Stay: Payer: BC Managed Care – PPO

## 2022-11-21 VITALS — BP 128/80 | HR 76 | Temp 98.0°F | Resp 17 | Wt 158.5 lb

## 2022-11-21 DIAGNOSIS — C9000 Multiple myeloma not having achieved remission: Secondary | ICD-10-CM | POA: Diagnosis not present

## 2022-11-21 DIAGNOSIS — Z7189 Other specified counseling: Secondary | ICD-10-CM

## 2022-11-21 LAB — COMPREHENSIVE METABOLIC PANEL
ALT: 21 U/L (ref 0–44)
AST: 16 U/L (ref 15–41)
Albumin: 4.3 g/dL (ref 3.5–5.0)
Alkaline Phosphatase: 61 U/L (ref 38–126)
Anion gap: 9 (ref 5–15)
BUN: 16 mg/dL (ref 6–20)
CO2: 26 mmol/L (ref 22–32)
Calcium: 9.3 mg/dL (ref 8.9–10.3)
Chloride: 105 mmol/L (ref 98–111)
Creatinine, Ser: 0.69 mg/dL (ref 0.61–1.24)
GFR, Estimated: >60 mL/min (ref >60)
Glucose, Bld: 230 mg/dL — ABNORMAL HIGH (ref 70–99)
Potassium: 3.6 mmol/L (ref 3.5–5.1)
Sodium: 140 mmol/L (ref 135–145)
Total Bilirubin: 0.5 mg/dL (ref 0.3–1.2)
Total Protein: 6.6 g/dL (ref 6.5–8.1)

## 2022-11-21 LAB — CBC WITH DIFFERENTIAL (CANCER CENTER ONLY)
Abs Immature Granulocytes: 0.02 10*3/uL (ref 0.00–0.07)
Basophils Absolute: 0 10*3/uL (ref 0.0–0.1)
Basophils Relative: 0 %
Eosinophils Absolute: 0.2 10*3/uL (ref 0.0–0.5)
Eosinophils Relative: 2 %
HCT: 41.4 % (ref 39.0–52.0)
Hemoglobin: 13.8 g/dL (ref 13.0–17.0)
Immature Granulocytes: 0 %
Lymphocytes Relative: 24 %
Lymphs Abs: 2.3 10*3/uL (ref 0.7–4.0)
MCH: 26.1 pg (ref 26.0–34.0)
MCHC: 33.3 g/dL (ref 30.0–36.0)
MCV: 78.4 fL — ABNORMAL LOW (ref 80.0–100.0)
Monocytes Absolute: 0.8 10*3/uL (ref 0.1–1.0)
Monocytes Relative: 8 %
Neutro Abs: 6.3 10*3/uL (ref 1.7–7.7)
Neutrophils Relative %: 66 %
Platelet Count: 188 10*3/uL (ref 150–400)
RBC: 5.28 MIL/uL (ref 4.22–5.81)
RDW: 15.8 % — ABNORMAL HIGH (ref 11.5–15.5)
WBC Count: 9.6 10*3/uL (ref 4.0–10.5)
nRBC: 0 % (ref 0.0–0.2)

## 2022-11-21 MED ORDER — SODIUM CHLORIDE 0.9 % IV SOLN
12.0000 mg | Freq: Once | INTRAVENOUS | Status: AC
  Administered 2022-11-21: 12 mg via INTRAVENOUS
  Filled 2022-11-21: qty 1.2

## 2022-11-21 MED ORDER — SODIUM CHLORIDE 0.9 % IV SOLN
16.0000 mg/kg | Freq: Once | INTRAVENOUS | Status: AC
  Administered 2022-11-21: 1100 mg via INTRAVENOUS
  Filled 2022-11-21: qty 40

## 2022-11-21 MED ORDER — MONTELUKAST SODIUM 10 MG PO TABS
10.0000 mg | ORAL_TABLET | Freq: Once | ORAL | Status: AC
  Administered 2022-11-21: 10 mg via ORAL
  Filled 2022-11-21: qty 1

## 2022-11-21 MED ORDER — DIPHENHYDRAMINE HCL 25 MG PO CAPS
50.0000 mg | ORAL_CAPSULE | Freq: Once | ORAL | Status: AC
  Administered 2022-11-21: 50 mg via ORAL
  Filled 2022-11-21: qty 2

## 2022-11-21 MED ORDER — SODIUM CHLORIDE 0.9 % IV SOLN: Freq: Once | INTRAVENOUS | Status: AC

## 2022-11-21 MED ORDER — ACETAMINOPHEN 325 MG PO TABS
650.0000 mg | ORAL_TABLET | Freq: Once | ORAL | Status: AC
  Administered 2022-11-21: 650 mg via ORAL
  Filled 2022-11-21: qty 2

## 2022-11-21 MED ORDER — FAMOTIDINE IN NACL 20-0.9 MG/50ML-% IV SOLN
20.0000 mg | Freq: Once | INTRAVENOUS | Status: AC
  Administered 2022-11-21: 20 mg via INTRAVENOUS
  Filled 2022-11-21: qty 50

## 2022-11-21 NOTE — Patient Instructions (Signed)
[facility]COL[facility]  Discharge Instructions: Thank you for choosing [facility] to provide your oncology and hematology care.   If you have a lab appointment with the [facility], please go directly to the [facility] and check in at the registration area.   Wear comfortable clothing and clothing appropriate for easy access to any Portacath or PICC line.   We strive to give you quality time with your provider. You may need to reschedule your appointment if you arrive late (15 or more minutes).  Arriving late affects you and other patients whose appointments are after yours.  Also, if you miss three or more appointments without notifying the office, you may be dismissed from the clinic at the provider's discretion.      For prescription refill requests, have your pharmacy contact our office and allow 72 hours for refills to be completed.    Today you received the following chemotherapy and/or immunotherapy agent: Daratumumab   To help prevent nausea and vomiting after your treatment, we encourage you to take your nausea medication as directed.  BELOW ARE SYMPTOMS THAT SHOULD BE REPORTED IMMEDIATELY: *FEVER GREATER THAN 100.4 F (38 C) OR HIGHER *CHILLS OR SWEATING *NAUSEA AND VOMITING THAT IS NOT CONTROLLED WITH YOUR NAUSEA MEDICATION *UNUSUAL SHORTNESS OF BREATH *UNUSUAL BRUISING OR BLEEDING *URINARY PROBLEMS (pain or burning when urinating, or frequent urination) *BOWEL PROBLEMS (unusual diarrhea, constipation, pain near the anus) TENDERNESS IN MOUTH AND THROAT WITH OR WITHOUT PRESENCE OF ULCERS (sore throat, sores in mouth, or a toothache) UNUSUAL RASH, SWELLING OR PAIN  UNUSUAL VAGINAL DISCHARGE OR ITCHING   Items with * indicate a potential emergency and should be followed up as soon as possible or go to the Emergency Department if any problems should occur.  Please show the CHEMOTHERAPY ALERT CARD or IMMUNOTHERAPY ALERT CARD at check-in to  the Emergency Department and triage nurse.  Should you have questions after your visit or need to cancel or reschedule your appointment, please contact [facility]  Dept: [phone]  and follow the prompts.  Office hours are 8:00 a.m. to 4:30 p.m. Monday - Friday. Please note that voicemails left after 4:00 p.m. may not be returned until the following business day.  We are closed weekends and major holidays. You have access to a nurse at all times for urgent questions. Please call the main number to the clinic Dept: [phone] and follow the prompts.   For any non-urgent questions, you may also contact your provider using MyChart. We now offer e-Visits for anyone 52 and older to request care online for non-urgent symptoms. For details visit [email or web address].   Also download the MyChart app! Go to the app store, search "MyChart", open the app, select [facility], and log in with your MyChart username and password.  Masks are optional in the cancer centers. If you would like for your care team to wear a mask while they are taking care of you, please let them know. You may have one support person who is at least 55 years old accompany you for your appointments.

## 2022-11-25 ENCOUNTER — Other Ambulatory Visit: Payer: Self-pay

## 2022-11-25 DIAGNOSIS — C9000 Multiple myeloma not having achieved remission: Secondary | ICD-10-CM

## 2022-11-25 LAB — MULTIPLE MYELOMA PANEL, SERUM
Albumin SerPl Elph-Mcnc: 3.8 g/dL (ref 2.9–4.4)
Albumin/Glob SerPl: 1.6 (ref 0.7–1.7)
Alpha 1: 0.1 g/dL (ref 0.0–0.4)
Alpha2 Glob SerPl Elph-Mcnc: 1.1 g/dL — ABNORMAL HIGH (ref 0.4–1.0)
B-Globulin SerPl Elph-Mcnc: 0.8 g/dL (ref 0.7–1.3)
Gamma Glob SerPl Elph-Mcnc: 0.4 g/dL (ref 0.4–1.8)
Globulin, Total: 2.4 g/dL (ref 2.2–3.9)
IgA: 40 mg/dL — ABNORMAL LOW (ref 90–386)
IgG (Immunoglobin G), Serum: 532 mg/dL — ABNORMAL LOW (ref 603–1613)
IgM (Immunoglobulin M), Srm: 28 mg/dL (ref 20–172)
Total Protein ELP: 6.2 g/dL (ref 6.0–8.5)

## 2022-11-25 MED ORDER — LENALIDOMIDE 25 MG PO CAPS: ORAL_CAPSULE | ORAL | 0 refills | Status: DC

## 2022-12-02 ENCOUNTER — Telehealth: Payer: Self-pay | Admitting: Physician Assistant

## 2022-12-02 NOTE — Telephone Encounter (Signed)
Patient called to verify upcoming appointments.

## 2022-12-05 ENCOUNTER — Inpatient Hospital Stay: Payer: BC Managed Care – PPO

## 2022-12-05 ENCOUNTER — Inpatient Hospital Stay: Payer: BC Managed Care – PPO | Attending: Hematology

## 2022-12-05 ENCOUNTER — Other Ambulatory Visit: Payer: Self-pay

## 2022-12-05 ENCOUNTER — Inpatient Hospital Stay (HOSPITAL_BASED_OUTPATIENT_CLINIC_OR_DEPARTMENT_OTHER): Payer: BC Managed Care – PPO | Admitting: Physician Assistant

## 2022-12-05 VITALS — BP 134/84 | HR 82 | Temp 97.7°F | Resp 18

## 2022-12-05 DIAGNOSIS — C9000 Multiple myeloma not having achieved remission: Secondary | ICD-10-CM

## 2022-12-05 DIAGNOSIS — G629 Polyneuropathy, unspecified: Secondary | ICD-10-CM | POA: Diagnosis not present

## 2022-12-05 DIAGNOSIS — Z7189 Other specified counseling: Secondary | ICD-10-CM

## 2022-12-05 DIAGNOSIS — D649 Anemia, unspecified: Secondary | ICD-10-CM | POA: Insufficient documentation

## 2022-12-05 DIAGNOSIS — Z5112 Encounter for antineoplastic immunotherapy: Secondary | ICD-10-CM | POA: Insufficient documentation

## 2022-12-05 LAB — CBC WITH DIFFERENTIAL (CANCER CENTER ONLY)
Abs Immature Granulocytes: 0.02 10*3/uL (ref 0.00–0.07)
Basophils Absolute: 0 10*3/uL (ref 0.0–0.1)
Basophils Relative: 0 %
Eosinophils Absolute: 0.3 10*3/uL (ref 0.0–0.5)
Eosinophils Relative: 3 %
HCT: 42.5 % (ref 39.0–52.0)
Hemoglobin: 14.5 g/dL (ref 13.0–17.0)
Immature Granulocytes: 0 %
Lymphocytes Relative: 26 %
Lymphs Abs: 2.6 10*3/uL (ref 0.7–4.0)
MCH: 26.4 pg (ref 26.0–34.0)
MCHC: 34.1 g/dL (ref 30.0–36.0)
MCV: 77.4 fL — ABNORMAL LOW (ref 80.0–100.0)
Monocytes Absolute: 0.7 10*3/uL (ref 0.1–1.0)
Monocytes Relative: 7 %
Neutro Abs: 6.4 10*3/uL (ref 1.7–7.7)
Neutrophils Relative %: 64 %
Platelet Count: 245 10*3/uL (ref 150–400)
RBC: 5.49 MIL/uL (ref 4.22–5.81)
RDW: 15.5 % (ref 11.5–15.5)
WBC Count: 10.1 10*3/uL (ref 4.0–10.5)
nRBC: 0 % (ref 0.0–0.2)

## 2022-12-05 LAB — COMPREHENSIVE METABOLIC PANEL
ALT: 22 U/L (ref 0–44)
AST: 14 U/L — ABNORMAL LOW (ref 15–41)
Albumin: 4.1 g/dL (ref 3.5–5.0)
Alkaline Phosphatase: 63 U/L (ref 38–126)
Anion gap: 4 — ABNORMAL LOW (ref 5–15)
BUN: 15 mg/dL (ref 6–20)
CO2: 31 mmol/L (ref 22–32)
Calcium: 9.8 mg/dL (ref 8.9–10.3)
Chloride: 105 mmol/L (ref 98–111)
Creatinine, Ser: 0.67 mg/dL (ref 0.61–1.24)
GFR, Estimated: >60 mL/min (ref >60)
Glucose, Bld: 190 mg/dL — ABNORMAL HIGH (ref 70–99)
Potassium: 3.6 mmol/L (ref 3.5–5.1)
Sodium: 140 mmol/L (ref 135–145)
Total Bilirubin: 0.6 mg/dL (ref 0.3–1.2)
Total Protein: 6.6 g/dL (ref 6.5–8.1)

## 2022-12-05 MED ORDER — MONTELUKAST SODIUM 10 MG PO TABS
10.0000 mg | ORAL_TABLET | Freq: Once | ORAL | Status: AC
  Administered 2022-12-05: 10 mg via ORAL
  Filled 2022-12-05: qty 1

## 2022-12-05 MED ORDER — SODIUM CHLORIDE 0.9 % IV SOLN
12.0000 mg | Freq: Once | INTRAVENOUS | Status: AC
  Administered 2022-12-05: 12 mg via INTRAVENOUS
  Filled 2022-12-05: qty 1.2

## 2022-12-05 MED ORDER — SODIUM CHLORIDE 0.9 % IV SOLN: Freq: Once | INTRAVENOUS | Status: DC

## 2022-12-05 MED ORDER — SODIUM CHLORIDE 0.9 % IV SOLN: Freq: Once | INTRAVENOUS | Status: AC

## 2022-12-05 MED ORDER — FAMOTIDINE IN NACL 20-0.9 MG/50ML-% IV SOLN
20.0000 mg | Freq: Once | INTRAVENOUS | Status: AC
  Administered 2022-12-05: 20 mg via INTRAVENOUS
  Filled 2022-12-05: qty 50

## 2022-12-05 MED ORDER — SODIUM CHLORIDE 0.9 % IV SOLN
16.0000 mg/kg | Freq: Once | INTRAVENOUS | Status: AC
  Administered 2022-12-05: 1100 mg via INTRAVENOUS
  Filled 2022-12-05: qty 40

## 2022-12-05 MED ORDER — ACETAMINOPHEN 325 MG PO TABS
650.0000 mg | ORAL_TABLET | Freq: Once | ORAL | Status: AC
  Administered 2022-12-05: 650 mg via ORAL
  Filled 2022-12-05: qty 2

## 2022-12-05 MED ORDER — DIPHENHYDRAMINE HCL 25 MG PO CAPS
50.0000 mg | ORAL_CAPSULE | Freq: Once | ORAL | Status: AC
  Administered 2022-12-05: 50 mg via ORAL
  Filled 2022-12-05: qty 2

## 2022-12-05 MED ORDER — ZOLEDRONIC ACID 4 MG/100ML IV SOLN: 4.0000 mg | Freq: Once | INTRAVENOUS | Status: DC

## 2022-12-05 NOTE — Progress Notes (Unsigned)
[name]  HEMATOLOGY/ONCOLOGY CLINIC NOTE  Date of Service: .12/05/2022  Chief complaint - Follow-up for continued evaluation and management of high risk multiple myeloma  Current treatment-daratumumab/Revlimid/dexamethasone Has not f/u on and does not appear keen to consider Melphalan/HDT-AUTOHSCT  INTERVAL HISTORY:  Donald Suarez is a 56 y.o. male is here with his [name] interpreter for continued evaluation and management of his multiple myeloma. He was last seen by Dr. Tenleigh Byer Limbo on 11/07/2022. He reports that he is tolerating Daratumumab without any significant limitations. He is still waiting for his Revlimid prescription as the speciality pharmacy is waiting to communicate with him with an intrepretor.   He reports his energy levels are overall stable and he continues to work. He denies any appetite loss or weight loss. He denies nausea, vomiting or abdominal pain. His bowel habits are unchanged without any recurrent episodes of diarrhea or constipation. He has stable neuropathy which does not affect his grip or balance. He denies easy bruising or signs of active bleeding. He denies fevers, chills, sweats, shortness of breath, chest pain or cough. He has no other complaints.    MEDICAL HISTORY:  Past Medical History:  Diagnosis Date   Anemia    Elevated ferritin 08/07/2022   Hypocalcemia 08/19/2022   In setting of multiple myeloma    Microalbuminuria 08/19/2022   Peripheral neuropathy 09/08/2022   Started 08/2022 A/w diabetes and revlimid usage Burning on top of left foot    SURGICAL HISTORY: Past Surgical History:  Procedure Laterality Date   APPENDECTOMY     [place] FLUORO GUIDE CV LINE RIGHT  05/15/2022   [place] PATIENT EVAL TECH 0-60 MINS  05/19/2022   [place]el US GUIDE VASC ACCESS RIGHT  05/15/2022    SOCIAL HISTORY: Social History   Socioeconomic History   Marital status: Married    Spouse name: Not on file   Number of children: Not on file   Years of education: Not on file   Highest  education level: Not on file  Occupational History   Not on file  Tobacco Use   Smoking status: Never   Smokeless tobacco: Never  Vaping Use   Vaping Use: Never used  Substance and Sexual Activity   Alcohol use: Never   Drug use: Never   Sexual activity: Not on file  Other Topics Concern   Not on file  Social History Narrative   Not on file   Social Determinants of Health   Financial Resource Strain: Medium Risk (05/21/2022)   Overall Financial Resource Strain (CARDIA)    Difficulty of Paying Living Expenses: Somewhat hard  Food Insecurity: Food Insecurity Present (05/21/2022)   Hunger Vital Sign    Worried About Running Out of Food in the Last Year: Sometimes true    Ran Out of Food in the Last Year: Sometimes true  Transportation Needs: No Transportation Needs (05/21/2022)   PRAPARE - Hydrologist (Medical): No    Lack of Transportation (Non-Medical): No  Physical Activity: Not on file  Stress: Not on file  Social Connections: Not on file  Intimate Partner Violence: Not on file    FAMILY HISTORY: No family history on file.  ALLERGIES:  has No Known Allergies.  MEDICATIONS:  . Allergies as of 12/05/2022   No Known Allergies      Medication List        Accurate as of December 05, 2022 11:43 AM. If you have any questions, ask your nurse or doctor.  Accu-Chek Guide test strip Generic drug: glucose blood Use to test blood sugars up to 4 times daily as directed.   Accu-Chek Guide w/Device Kit Use to test blood sugars up to 4 times daily as directed.   Accu-Chek Softclix Lancets lancets Use to test blood sugars up to 4 times daily as directed.   acyclovir 400 MG tablet Commonly known as: ZOVIRAX Take 1 tablet (400 mg total) by mouth 2 (two) times daily.   aspirin EC 81 MG tablet Take 1 tablet (81 mg total) by mouth daily. Swallow whole.   B-12 1000 MCG Caps Take 1 tablet by mouth daily.   FreeStyle Libre 3 Sensor  Misc 1 Application by Does not apply route every 14 (fourteen) days. Place 1 sensor on the skin every 14 days. Use to check glucose continuously   gabapentin 300 MG capsule Commonly known as: NEURONTIN Take 1 capsule (300 mg total) by mouth 3 (three) times daily.   lenalidomide 25 MG capsule Commonly known as: REVLIMID TAKE 1 CAPSULE BY MOUTH 1 TIME A DAY FOR 21 DAYS ON THEN 7 DAYS OFF   metFORMIN 500 MG tablet Commonly known as: GLUCOPHAGE Take 1 tablet (500 mg total) by mouth 2 (two) times daily with a meal. To start take just half tablet twice daily before meals for 3 days   ondansetron 8 MG tablet Commonly known as: Zofran Take 1 tablet (8 mg total) by mouth every 8 (eight) hours as needed for nausea or vomiting.   prochlorperazine 10 MG tablet Commonly known as: COMPAZINE Take 1 tablet (10 mg total) by mouth every 6 (six) hours as needed for nausea or vomiting.         REVIEW OF SYSTEMS:   10 Point review of Systems was done is negative except as noted above.   PHYSICAL EXAMINATION: Vitals:   12/05/22 1133  BP: 137/82  Pulse: 89  Resp: 19  Temp: (!) 97.3 F (36.3 C)  SpO2: 100%   GENERAL:alert, in no acute distress and comfortable SKIN: no acute rashes, no significant lesions EYES: conjunctiva are pink and non-injected, sclera anicteric OROPHARYNX: MMM, no exudates, no oropharyngeal erythema or ulceration NECK: supple, no JVD LYMPH:  no palpable lymphadenopathy in the cervical or supraclavicular regions LUNGS: clear to auscultation b/l with normal respiratory effort HEART: regular rate & rhythm Extremity: no pedal edema PSYCH: alert & oriented x 3 with fluent speech NEURO: no focal motor/sensory deficits     LABORATORY DATA:  I have reviewed the data as listed  .    Latest Ref Rng & Units 12/05/2022   11:13 AM 11/21/2022    8:38 AM 11/07/2022    8:57 AM  CBC  WBC 4.0 - 10.5 K/uL 10.1  9.6  10.6   Hemoglobin 13.0 - 17.0 g/dL 14.5  13.8  14.0    Hematocrit 39.0 - 52.0 % 42.5  41.4  42.2   Platelets 150 - 400 K/uL 245  188  200     .    Latest Ref Rng & Units 11/21/2022    8:38 AM 11/07/2022   11:13 AM 10/10/2022    1:04 PM  CMP  Glucose 70 - 99 mg/dL 230  105  136   BUN 6 - 20 mg/dL _0 Creatinine 0.61 - 1.24 mg/dL 0.69  0.64  0.56   Sodium 135 - 145 mmol/L 140  142  140   Potassium 3.5 - 5.1 mmol/L 3.6  3.4  3.7  Chloride 98 - 111 mmol/L 105  107  108   CO2 22 - 32 mmol/L _0 Calcium 8.9 - 10.3 mg/dL 9.3  9.0  8.4   Total Protein 6.5 - 8.1 g/dL 6.6  7.0  6.0   Total Bilirubin 0.3 - 1.2 mg/dL 0.5  0.7  0.5   Alkaline Phos 38 - 126 U/L 61  55  65   AST 15 - 41 U/L _1 ALT 0 - 44 U/L _2 . Lab Results  Component Value Date   LDH 114 10/10/2022      RADIOGRAPHIC STUDIES: I have personally reviewed the radiological images as listed and agreed with the findings in the report. No results found.  ASSESSMENT & PLAN:   1) R-ISS stage III high risk IgG Lambda Multiple myeloma not on treatment for more than 1 year due to patient's lack of follow-up.  Patient diagnosed December 2021 and received 1 cycle of CyBorD. Had previously presented with anemia renal insufficiency and hyperviscosity symptoms. Bx- 90% plasma cells Initial M spike 8.03g/dl Del(1p): Not Detected  Dup(1q): Not Detected  Gains(15): DETECTED  Gains(5 and 9): Not Detected  Del(13q)/-13: DETECTED  Del(17p)(TP53): Not Detected  IGH(Rearrangement): SEE BELOW   IgH complex: t(4;14): DETECTED  t(11;14): Not Detected  t(14;16): Not Detected  t(14;20): Not Detected   Cytogenetics: Normal male karyotype.   -Labs from 05/13/2022-beta-2 microglobulin 4.4, LDH 197, lambda free light chain 77.1, kappa, lambda light chain ratio 0.08, multiple myeloma panel pending.  UPEP ordered but not yet collected. 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  Due to multiple myeloma   PLAN -Labs from today reviewed and adequate for treatment. No cytopenias. Calcium, creatinine and LFTs normal. Most recent myeloma labs from 11/21/2022 showed that M protein is not detected.  -Tolerating Daratumumab without any prohibitive toxicities.  -Able to process Revlimid prescription with [facility] special[facility] pharmacy today with the assistance of translator.  -Patient is not interested in autologous bone marrow transplantation at this time and has missed several appointments with the [facility] transplant team. -Proceed with cycle 2, Day 15 of daratumumab/dex today -Last Zometa was on 11/07/2022. Next due next treatment.   Follow-up -Continue with labs, daratumumab/dex next on 12/19/2022.  -RTC for a follow up visit with Dr. Daouda Lonzo Limbo on 01/02/2023.     All of the patient's questions were answered with apparent satisfaction. The patient knows to call the clinic with any problems, questions or concerns.  I have spent a total of 30 minutes minutes of face-to-face and non-face-to-face time, preparing to see the patient,  performing a medically appropriate examination, counseling and educating the patient, ordering tests/procedures, documenting clinical information in the electronic health record,and care coordination.   Dede Query PA-C Dept of Hematology and [place] at [facility] Phone: [phone]

## 2022-12-05 NOTE — Patient Instructions (Signed)
[facility]COL[facility]  Discharge Instructions: Thank you for choosing [facility] to provide your oncology and hematology care.   If you have a lab appointment with the [facility], please go directly to the [facility] and check in at the registration area.   Wear comfortable clothing and clothing appropriate for easy access to any Portacath or PICC line.   We strive to give you quality time with your provider. You may need to reschedule your appointment if you arrive late (15 or more minutes).  Arriving late affects you and other patients whose appointments are after yours.  Also, if you miss three or more appointments without notifying the office, you may be dismissed from the clinic at the provider's discretion.      For prescription refill requests, have your pharmacy contact our office and allow 72 hours for refills to be completed.    Today you received the following chemotherapy and/or immunotherapy agents darzalex      To help prevent nausea and vomiting after your treatment, we encourage you to take your nausea medication as directed.  BELOW ARE SYMPTOMS THAT SHOULD BE REPORTED IMMEDIATELY: *FEVER GREATER THAN 100.4 F (38 C) OR HIGHER *CHILLS OR SWEATING *NAUSEA AND VOMITING THAT IS NOT CONTROLLED WITH YOUR NAUSEA MEDICATION *UNUSUAL SHORTNESS OF BREATH *UNUSUAL BRUISING OR BLEEDING *URINARY PROBLEMS (pain or burning when urinating, or frequent urination) *BOWEL PROBLEMS (unusual diarrhea, constipation, pain near the anus) TENDERNESS IN MOUTH AND THROAT WITH OR WITHOUT PRESENCE OF ULCERS (sore throat, sores in mouth, or a toothache) UNUSUAL RASH, SWELLING OR PAIN  UNUSUAL VAGINAL DISCHARGE OR ITCHING   Items with * indicate a potential emergency and should be followed up as soon as possible or go to the Emergency Department if any problems should occur.  Please show the CHEMOTHERAPY ALERT CARD or IMMUNOTHERAPY ALERT CARD at check-in to  the Emergency Department and triage nurse.  Should you have questions after your visit or need to cancel or reschedule your appointment, please contact [facility]  Dept: [phone]  and follow the prompts.  Office hours are 8:00 a.m. to 4:30 p.m. Monday - Friday. Please note that voicemails left after 4:00 p.m. may not be returned until the following business day.  We are closed weekends and major holidays. You have access to a nurse at all times for urgent questions. Please call the main number to the clinic Dept: [phone] and follow the prompts.   For any non-urgent questions, you may also contact your provider using MyChart. We now offer e-Visits for anyone 16 and older to request care online for non-urgent symptoms. For details visit [email or web address].   Also download the MyChart app! Go to the app store, search "MyChart", open the app, select [facility], and log in with your MyChart username and password.  Masks are optional in the cancer centers. If you would like for your care team to wear a mask while they are taking care of you, please let them know. You may have one support person who is at least 55 years old accompany you for your appointments.

## 2022-12-05 NOTE — Progress Notes (Signed)
Patient seen by PA today  Vitals are within treatment parameters.  Labs reviewed: and are within treatment parameters.  Per physician team, patient is ready for treatment and there are NO modifications to the treatment plan.  

## 2022-12-07 ENCOUNTER — Encounter: Payer: Self-pay | Admitting: Hematology

## 2022-12-09 ENCOUNTER — Other Ambulatory Visit (HOSPITAL_COMMUNITY): Payer: Self-pay

## 2022-12-17 ENCOUNTER — Telehealth: Payer: Self-pay | Admitting: Hematology

## 2022-12-17 NOTE — Telephone Encounter (Signed)
Per 12/20 secure chat, called pt friend due to interpreter difficulties, pt friend said they will relay the message

## 2022-12-19 ENCOUNTER — Inpatient Hospital Stay: Payer: BC Managed Care – PPO

## 2022-12-19 VITALS — BP 129/67 | HR 71 | Temp 97.8°F | Resp 17 | Wt 164.8 lb

## 2022-12-19 DIAGNOSIS — C9000 Multiple myeloma not having achieved remission: Secondary | ICD-10-CM | POA: Diagnosis not present

## 2022-12-19 DIAGNOSIS — Z7189 Other specified counseling: Secondary | ICD-10-CM

## 2022-12-19 LAB — COMPREHENSIVE METABOLIC PANEL
ALT: 23 U/L (ref 0–44)
AST: 14 U/L — ABNORMAL LOW (ref 15–41)
Albumin: 4.1 g/dL (ref 3.5–5.0)
Alkaline Phosphatase: 50 U/L (ref 38–126)
Anion gap: 5 (ref 5–15)
BUN: 15 mg/dL (ref 6–20)
CO2: 29 mmol/L (ref 22–32)
Calcium: 9.1 mg/dL (ref 8.9–10.3)
Chloride: 105 mmol/L (ref 98–111)
Creatinine, Ser: 0.82 mg/dL (ref 0.61–1.24)
GFR, Estimated: >60 mL/min (ref >60)
Glucose, Bld: 153 mg/dL — ABNORMAL HIGH (ref 70–99)
Potassium: 3.6 mmol/L (ref 3.5–5.1)
Sodium: 139 mmol/L (ref 135–145)
Total Bilirubin: 0.5 mg/dL (ref 0.3–1.2)
Total Protein: 6.4 g/dL — ABNORMAL LOW (ref 6.5–8.1)

## 2022-12-19 LAB — CBC WITH DIFFERENTIAL (CANCER CENTER ONLY)
Abs Immature Granulocytes: 0.03 10*3/uL (ref 0.00–0.07)
Basophils Absolute: 0 10*3/uL (ref 0.0–0.1)
Basophils Relative: 0 %
Eosinophils Absolute: 0.4 10*3/uL (ref 0.0–0.5)
Eosinophils Relative: 5 %
HCT: 41.2 % (ref 39.0–52.0)
Hemoglobin: 13.8 g/dL (ref 13.0–17.0)
Immature Granulocytes: 0 %
Lymphocytes Relative: 27 %
Lymphs Abs: 2.2 10*3/uL (ref 0.7–4.0)
MCH: 26.4 pg (ref 26.0–34.0)
MCHC: 33.5 g/dL (ref 30.0–36.0)
MCV: 78.8 fL — ABNORMAL LOW (ref 80.0–100.0)
Monocytes Absolute: 0.5 10*3/uL (ref 0.1–1.0)
Monocytes Relative: 7 %
Neutro Abs: 4.9 10*3/uL (ref 1.7–7.7)
Neutrophils Relative %: 61 %
Platelet Count: 191 10*3/uL (ref 150–400)
RBC: 5.23 MIL/uL (ref 4.22–5.81)
RDW: 15.8 % — ABNORMAL HIGH (ref 11.5–15.5)
WBC Count: 8 10*3/uL (ref 4.0–10.5)
nRBC: 0 % (ref 0.0–0.2)

## 2022-12-19 MED ORDER — ACETAMINOPHEN 325 MG PO TABS
650.0000 mg | ORAL_TABLET | Freq: Once | ORAL | Status: AC
  Administered 2022-12-19: 650 mg via ORAL
  Filled 2022-12-19: qty 2

## 2022-12-19 MED ORDER — SODIUM CHLORIDE 0.9 % IV SOLN: Freq: Once | INTRAVENOUS | Status: AC

## 2022-12-19 MED ORDER — FAMOTIDINE IN NACL 20-0.9 MG/50ML-% IV SOLN
20.0000 mg | Freq: Once | INTRAVENOUS | Status: AC
  Administered 2022-12-19: 20 mg via INTRAVENOUS
  Filled 2022-12-19: qty 50

## 2022-12-19 MED ORDER — MONTELUKAST SODIUM 10 MG PO TABS
10.0000 mg | ORAL_TABLET | Freq: Once | ORAL | Status: AC
  Administered 2022-12-19: 10 mg via ORAL
  Filled 2022-12-19: qty 1

## 2022-12-19 MED ORDER — SODIUM CHLORIDE 0.9 % IV SOLN
16.0000 mg/kg | Freq: Once | INTRAVENOUS | Status: AC
  Administered 2022-12-19: 1100 mg via INTRAVENOUS
  Filled 2022-12-19: qty 40

## 2022-12-19 MED ORDER — DIPHENHYDRAMINE HCL 25 MG PO CAPS
50.0000 mg | ORAL_CAPSULE | Freq: Once | ORAL | Status: AC
  Administered 2022-12-19: 50 mg via ORAL
  Filled 2022-12-19: qty 2

## 2022-12-19 MED ORDER — SODIUM CHLORIDE 0.9 % IV SOLN
12.0000 mg | Freq: Once | INTRAVENOUS | Status: AC
  Administered 2022-12-19: 12 mg via INTRAVENOUS
  Filled 2022-12-19: qty 1.2

## 2022-12-19 MED ORDER — ZOLEDRONIC ACID 4 MG/100ML IV SOLN
4.0000 mg | Freq: Once | INTRAVENOUS | Status: AC
  Administered 2022-12-19: 4 mg via INTRAVENOUS
  Filled 2022-12-19: qty 100

## 2022-12-19 NOTE — Patient Instructions (Signed)
[facility]COL[facility]  Discharge Instructions: Thank you for choosing [facility] to provide your oncology and hematology care.   If you have a lab appointment with the [facility], please go directly to the [facility] and check in at the registration area.   Wear comfortable clothing and clothing appropriate for easy access to any Portacath or PICC line.   We strive to give you quality time with your provider. You may need to reschedule your appointment if you arrive late (15 or more minutes).  Arriving late affects you and other patients whose appointments are after yours.  Also, if you miss three or more appointments without notifying the office, you may be dismissed from the clinic at the provider's discretion.      For prescription refill requests, have your pharmacy contact our office and allow 72 hours for refills to be completed.    Today you received the following chemotherapy and/or immunotherapy agents darzalex      To help prevent nausea and vomiting after your treatment, we encourage you to take your nausea medication as directed.  BELOW ARE SYMPTOMS THAT SHOULD BE REPORTED IMMEDIATELY: *FEVER GREATER THAN 100.4 F (38 C) OR HIGHER *CHILLS OR SWEATING *NAUSEA AND VOMITING THAT IS NOT CONTROLLED WITH YOUR NAUSEA MEDICATION *UNUSUAL SHORTNESS OF BREATH *UNUSUAL BRUISING OR BLEEDING *URINARY PROBLEMS (pain or burning when urinating, or frequent urination) *BOWEL PROBLEMS (unusual diarrhea, constipation, pain near the anus) TENDERNESS IN MOUTH AND THROAT WITH OR WITHOUT PRESENCE OF ULCERS (sore throat, sores in mouth, or a toothache) UNUSUAL RASH, SWELLING OR PAIN  UNUSUAL VAGINAL DISCHARGE OR ITCHING   Items with * indicate a potential emergency and should be followed up as soon as possible or go to the Emergency Department if any problems should occur.  Please show the CHEMOTHERAPY ALERT CARD or IMMUNOTHERAPY ALERT CARD at check-in to  the Emergency Department and triage nurse.  Should you have questions after your visit or need to cancel or reschedule your appointment, please contact [facility]  Dept: [phone]  and follow the prompts.  Office hours are 8:00 a.m. to 4:30 p.m. Monday - Friday. Please note that voicemails left after 4:00 p.m. may not be returned until the following business day.  We are closed weekends and major holidays. You have access to a nurse at all times for urgent questions. Please call the main number to the clinic Dept: [phone] and follow the prompts.   For any non-urgent questions, you may also contact your provider using MyChart. We now offer e-Visits for anyone 18 and older to request care online for non-urgent symptoms. For details visit [email or web address].   Also download the MyChart app! Go to the app store, search "MyChart", open the app, select [facility], and log in with your MyChart username and password.  Masks are optional in the cancer centers. If you would like for your care team to wear a mask while they are taking care of you, please let them know. You may have one support person who is at least 55 years old accompany you for your appointments.

## 2022-12-25 LAB — MULTIPLE MYELOMA PANEL, SERUM
Albumin SerPl Elph-Mcnc: 3.3 g/dL (ref 2.9–4.4)
Albumin/Glob SerPl: 1.5 (ref 0.7–1.7)
Alpha 1: 0.1 g/dL (ref 0.0–0.4)
Alpha2 Glob SerPl Elph-Mcnc: 0.9 g/dL (ref 0.4–1.0)
B-Globulin SerPl Elph-Mcnc: 0.8 g/dL (ref 0.7–1.3)
Gamma Glob SerPl Elph-Mcnc: 0.5 g/dL (ref 0.4–1.8)
Globulin, Total: 2.3 g/dL (ref 2.2–3.9)
IgA: 48 mg/dL — ABNORMAL LOW (ref 90–386)
IgG (Immunoglobin G), Serum: 539 mg/dL — ABNORMAL LOW (ref 603–1613)
IgM (Immunoglobulin M), Srm: 27 mg/dL (ref 20–172)
M Protein SerPl Elph-Mcnc: 0.1 g/dL — ABNORMAL HIGH
Total Protein ELP: 5.6 g/dL — ABNORMAL LOW (ref 6.0–8.5)

## 2022-12-30 ENCOUNTER — Other Ambulatory Visit: Payer: Self-pay | Admitting: Hematology

## 2022-12-30 DIAGNOSIS — C9000 Multiple myeloma not having achieved remission: Secondary | ICD-10-CM

## 2022-12-31 NOTE — Progress Notes (Signed)
Contacted [facility] on pt's behalf (pt gave this RN permission, via interpreter,  to do counseling and arrange delivery of Revlimid on his behalf) to do Revlimid drug counseling and arrange delivery of drug. Drug to be delivered to [facility] for pt. This will be done monthly as [facility] does not have access to an interpreter that speaks [place] dialect. Pt will pick up medication from this RN at the [facility].

## 2023-01-02 ENCOUNTER — Inpatient Hospital Stay (HOSPITAL_BASED_OUTPATIENT_CLINIC_OR_DEPARTMENT_OTHER): Payer: BC Managed Care – PPO | Admitting: Hematology

## 2023-01-02 ENCOUNTER — Inpatient Hospital Stay: Payer: BC Managed Care – PPO

## 2023-01-02 ENCOUNTER — Inpatient Hospital Stay: Payer: BC Managed Care – PPO | Attending: Hematology

## 2023-01-02 ENCOUNTER — Other Ambulatory Visit: Payer: Self-pay

## 2023-01-02 VITALS — BP 111/78 | HR 77 | Temp 98.3°F | Resp 18 | Ht 66.0 in | Wt 171.8 lb

## 2023-01-02 DIAGNOSIS — C9000 Multiple myeloma not having achieved remission: Secondary | ICD-10-CM

## 2023-01-02 DIAGNOSIS — G629 Polyneuropathy, unspecified: Secondary | ICD-10-CM | POA: Diagnosis not present

## 2023-01-02 DIAGNOSIS — Z5112 Encounter for antineoplastic immunotherapy: Secondary | ICD-10-CM | POA: Diagnosis present

## 2023-01-02 DIAGNOSIS — Z7189 Other specified counseling: Secondary | ICD-10-CM

## 2023-01-02 DIAGNOSIS — D751 Secondary polycythemia: Secondary | ICD-10-CM | POA: Insufficient documentation

## 2023-01-02 DIAGNOSIS — D696 Thrombocytopenia, unspecified: Secondary | ICD-10-CM | POA: Diagnosis not present

## 2023-01-02 DIAGNOSIS — Z7982 Long term (current) use of aspirin: Secondary | ICD-10-CM | POA: Diagnosis not present

## 2023-01-02 DIAGNOSIS — Z79624 Long term (current) use of inhibitors of nucleotide synthesis: Secondary | ICD-10-CM | POA: Insufficient documentation

## 2023-01-02 LAB — CBC WITH DIFFERENTIAL (CANCER CENTER ONLY)
Abs Immature Granulocytes: 0.01 10*3/uL (ref 0.00–0.07)
Basophils Absolute: 0.1 10*3/uL (ref 0.0–0.1)
Basophils Relative: 1 %
Eosinophils Absolute: 0.2 10*3/uL (ref 0.0–0.5)
Eosinophils Relative: 4 %
HCT: 41.1 % (ref 39.0–52.0)
Hemoglobin: 13.9 g/dL (ref 13.0–17.0)
Immature Granulocytes: 0 %
Lymphocytes Relative: 29 %
Lymphs Abs: 1.9 10*3/uL (ref 0.7–4.0)
MCH: 26.9 pg (ref 26.0–34.0)
MCHC: 33.8 g/dL (ref 30.0–36.0)
MCV: 79.5 fL — ABNORMAL LOW (ref 80.0–100.0)
Monocytes Absolute: 0.7 10*3/uL (ref 0.1–1.0)
Monocytes Relative: 10 %
Neutro Abs: 3.8 10*3/uL (ref 1.7–7.7)
Neutrophils Relative %: 56 %
Platelet Count: 197 10*3/uL (ref 150–400)
RBC: 5.17 MIL/uL (ref 4.22–5.81)
RDW: 15.3 % (ref 11.5–15.5)
WBC Count: 6.7 10*3/uL (ref 4.0–10.5)
nRBC: 0 % (ref 0.0–0.2)

## 2023-01-02 LAB — COMPREHENSIVE METABOLIC PANEL
ALT: 25 U/L (ref 0–44)
AST: 14 U/L — ABNORMAL LOW (ref 15–41)
Albumin: 4.1 g/dL (ref 3.5–5.0)
Alkaline Phosphatase: 50 U/L (ref 38–126)
Anion gap: 6 (ref 5–15)
BUN: 11 mg/dL (ref 6–20)
CO2: 28 mmol/L (ref 22–32)
Calcium: 8.9 mg/dL (ref 8.9–10.3)
Chloride: 104 mmol/L (ref 98–111)
Creatinine, Ser: 0.69 mg/dL (ref 0.61–1.24)
GFR, Estimated: >60 mL/min (ref >60)
Glucose, Bld: 202 mg/dL — ABNORMAL HIGH (ref 70–99)
Potassium: 3.4 mmol/L — ABNORMAL LOW (ref 3.5–5.1)
Sodium: 138 mmol/L (ref 135–145)
Total Bilirubin: 0.6 mg/dL (ref 0.3–1.2)
Total Protein: 6 g/dL — ABNORMAL LOW (ref 6.5–8.1)

## 2023-01-02 MED ORDER — DIPHENHYDRAMINE HCL 25 MG PO CAPS
50.0000 mg | ORAL_CAPSULE | Freq: Once | ORAL | Status: AC
  Administered 2023-01-02: 50 mg via ORAL
  Filled 2023-01-02: qty 2

## 2023-01-02 MED ORDER — SODIUM CHLORIDE 0.9 % IV SOLN
16.0000 mg/kg | Freq: Once | INTRAVENOUS | Status: AC
  Administered 2023-01-02: 1100 mg via INTRAVENOUS
  Filled 2023-01-02: qty 40

## 2023-01-02 MED ORDER — ACETAMINOPHEN 325 MG PO TABS
650.0000 mg | ORAL_TABLET | Freq: Once | ORAL | Status: AC
  Administered 2023-01-02: 650 mg via ORAL
  Filled 2023-01-02: qty 2

## 2023-01-02 MED ORDER — SODIUM CHLORIDE 0.9 % IV SOLN: Freq: Once | INTRAVENOUS | Status: AC

## 2023-01-02 MED ORDER — FAMOTIDINE IN NACL 20-0.9 MG/50ML-% IV SOLN
20.0000 mg | Freq: Once | INTRAVENOUS | Status: AC
  Administered 2023-01-02: 20 mg via INTRAVENOUS
  Filled 2023-01-02: qty 50

## 2023-01-02 MED ORDER — SODIUM CHLORIDE 0.9 % IV SOLN
12.0000 mg | Freq: Once | INTRAVENOUS | Status: AC
  Administered 2023-01-02: 12 mg via INTRAVENOUS
  Filled 2023-01-02: qty 1.2

## 2023-01-02 MED ORDER — MONTELUKAST SODIUM 10 MG PO TABS
10.0000 mg | ORAL_TABLET | Freq: Once | ORAL | Status: AC
  Administered 2023-01-02: 10 mg via ORAL
  Filled 2023-01-02: qty 1

## 2023-01-02 NOTE — Patient Instructions (Signed)
[facility]COL[facility]  Discharge Instructions: Thank you for choosing [facility] to provide your oncology and hematology care.   If you have a lab appointment with the [facility], please go directly to the [facility] and check in at the registration area.   Wear comfortable clothing and clothing appropriate for easy access to any Portacath or PICC line.   We strive to give you quality time with your provider. You may need to reschedule your appointment if you arrive late (15 or more minutes).  Arriving late affects you and other patients whose appointments are after yours.  Also, if you miss three or more appointments without notifying the office, you may be dismissed from the clinic at the provider's discretion.      For prescription refill requests, have your pharmacy contact our office and allow 72 hours for refills to be completed.    Today you received the following chemotherapy and/or immunotherapy agents: Darzalex      To help prevent nausea and vomiting after your treatment, we encourage you to take your nausea medication as directed.  BELOW ARE SYMPTOMS THAT SHOULD BE REPORTED IMMEDIATELY: *FEVER GREATER THAN 100.4 F (38 C) OR HIGHER *CHILLS OR SWEATING *NAUSEA AND VOMITING THAT IS NOT CONTROLLED WITH YOUR NAUSEA MEDICATION *UNUSUAL SHORTNESS OF BREATH *UNUSUAL BRUISING OR BLEEDING *URINARY PROBLEMS (pain or burning when urinating, or frequent urination) *BOWEL PROBLEMS (unusual diarrhea, constipation, pain near the anus) TENDERNESS IN MOUTH AND THROAT WITH OR WITHOUT PRESENCE OF ULCERS (sore throat, sores in mouth, or a toothache) UNUSUAL RASH, SWELLING OR PAIN  UNUSUAL VAGINAL DISCHARGE OR ITCHING   Items with * indicate a potential emergency and should be followed up as soon as possible or go to the Emergency Department if any problems should occur.  Please show the CHEMOTHERAPY ALERT CARD or IMMUNOTHERAPY ALERT CARD at check-in to  the Emergency Department and triage nurse.  Should you have questions after your visit or need to cancel or reschedule your appointment, please contact [facility]  Dept: [phone]  and follow the prompts.  Office hours are 8:00 a.m. to 4:30 p.m. Monday - Friday. Please note that voicemails left after 4:00 p.m. may not be returned until the following business day.  We are closed weekends and major holidays. You have access to a nurse at all times for urgent questions. Please call the main number to the clinic Dept: [phone] and follow the prompts.   For any non-urgent questions, you may also contact your provider using MyChart. We now offer e-Visits for anyone 19 and older to request care online for non-urgent symptoms. For details visit [email or web address].   Also download the MyChart app! Go to the app store, search "MyChart", open the app, select [facility], and log in with your MyChart username and password.  Masks are optional in the cancer centers. If you would like for your care team to wear a mask while they are taking care of you, please let them know. You may have one support person who is at least 56 years old accompany you for your appointments.

## 2023-01-02 NOTE — Progress Notes (Signed)
[name]  HEMATOLOGY/ONCOLOGY CLINIC NOTE  Date of Service: 01/02/23  Chief complaint - Follow-up for continued evaluation and management of high risk multiple myeloma  Current treatment-daratumumab/Revlimid/dexamethasone Has not f/u on and does not appear keen to consider Melphalan/HDT-AUTOHSCT  INTERVAL HISTORY:  Donald Suarez is a 57 y.o. male is here with his Donald Suarez interpreter for continued evaluation and management of his multiple myeloma.   Patient was last seen by Donald Brigham, PA-C on 12/05/22 and reported stable neuropathy. He was doing well otherwise with no medical concerns.  Today, he accompanied by an interpreter. He reports doing well overall. He is tolerating his current medications and treatments.  He denies any infection issues or abdominal pain. He does not engage in heavy lifting with his work.  He is UTD with his RSV, influenza, and COVID-19 booster vaccinations.  He does not require any medication refills at this time. He is compliant with his current medications including acyclovir 400 MG, aspirin 81 MG, and Revlimid prescription.  MEDICAL HISTORY:  Past Medical History:  Diagnosis Date   Anemia    Elevated ferritin 08/07/2022   Hypocalcemia 08/19/2022   In setting of multiple myeloma    Microalbuminuria 08/19/2022   Peripheral neuropathy 09/08/2022   Started 08/2022 A/w diabetes and revlimid usage Burning on top of left foot    SURGICAL HISTORY: Past Surgical History:  Procedure Laterality Date   APPENDECTOMY     [facility] FLUORO GUIDE CV LINE RIGHT  05/15/2022   Zayvion PATIENT EVAL TECH 0-60 MINS  05/19/2022   [facility] US GUIDE VASC ACCESS RIGHT  05/15/2022    SOCIAL HISTORY: Social History   Socioeconomic History   Marital status: Married    Spouse name: Not on file   Number of children: Not on file   Years of education: Not on file   Highest education level: Not on file  Occupational History   Not on file  Tobacco Use   Smoking status: Never   Smokeless  tobacco: Never  Vaping Use   Vaping Use: Never used  Substance and Sexual Activity   Alcohol use: Never   Drug use: Never   Sexual activity: Not on file  Other Topics Concern   Not on file  Social History Narrative   Not on file   Social Determinants of Health   Financial Resource Strain: Medium Risk (05/21/2022)   Overall Financial Resource Strain (CARDIA)    Difficulty of Paying Living Expenses: Somewhat hard  Food Insecurity: Food Insecurity Present (05/21/2022)   Hunger Vital Sign    Worried About Running Out of Food in the Last Year: Sometimes true    Ran Out of Food in the Last Year: Sometimes true  Transportation Needs: No Transportation Needs (05/21/2022)   PRAPARE - Hydrologist (Medical): No    Lack of Transportation (Non-Medical): No  Physical Activity: Not on file  Stress: Not on file  Social Connections: Not on file  Intimate Partner Violence: Not on file    FAMILY HISTORY: No family history on file.  ALLERGIES:  has No Known Allergies.  MEDICATIONS:  . Allergies as of 01/02/2023   No Known Allergies      Medication List        Accurate as of January 02, 2023  8:38 AM. If you have any questions, ask your nurse or doctor.          Accu-Chek Guide test strip Generic drug: glucose blood Use to test blood  sugars up to 4 times daily as directed.   Accu-Chek Guide w/Device Kit Use to test blood sugars up to 4 times daily as directed.   Accu-Chek Softclix Lancets lancets Use to test blood sugars up to 4 times daily as directed.   acyclovir 400 MG tablet Commonly known as: ZOVIRAX Take 1 tablet (400 mg total) by mouth 2 (two) times daily.   aspirin EC 81 MG tablet Take 1 tablet (81 mg total) by mouth daily. Swallow whole.   B-12 1000 MCG Caps Take 1 tablet by mouth daily.   FreeStyle Libre 3 Sensor Misc 1 Application by Does not apply route every 14 (fourteen) days. Place 1 sensor on the skin every 14 days. Use to  check glucose continuously   gabapentin 300 MG capsule Commonly known as: NEURONTIN Take 1 capsule (300 mg total) by mouth 3 (three) times daily.   lenalidomide 25 MG capsule Commonly known as: REVLIMID TAKE 1 CAPSULE BY MOUTH 1 TIME A DAY FOR 21 DAYS ON THEN 7 DAYS OFF   metFORMIN 500 MG tablet Commonly known as: GLUCOPHAGE Take 1 tablet (500 mg total) by mouth 2 (two) times daily with a meal. To start take just half tablet twice daily before meals for 3 days   ondansetron 8 MG tablet Commonly known as: Zofran Take 1 tablet (8 mg total) by mouth every 8 (eight) hours as needed for nausea or vomiting.   prochlorperazine 10 MG tablet Commonly known as: COMPAZINE Take 1 tablet (10 mg total) by mouth every 6 (six) hours as needed for nausea or vomiting.         REVIEW OF SYSTEMS:   10 Point review of Systems was done is negative except as noted above.   PHYSICAL EXAMINATION: Vital signs reviewed. stable  GENERAL:alert, in no acute distress and comfortable SKIN: no acute rashes, no significant lesions EYES: conjunctiva are pink and non-injected, sclera anicteric OROPHARYNX: MMM, no exudates, no oropharyngeal erythema or ulceration NECK: supple, no JVD LYMPH:  no palpable lymphadenopathy in the cervical or supraclavicular regions LUNGS: clear to auscultation b/l with normal respiratory effort HEART: regular rate & rhythm Extremity: no pedal edema PSYCH: alert & oriented x 3 with fluent speech NEURO: no focal motor/sensory deficits   LABORATORY DATA:  I have reviewed the data as listed  .    Latest Ref Rng & Units 12/19/2022   11:55 AM 12/05/2022   11:13 AM 11/21/2022    8:38 AM  CBC  WBC 4.0 - 10.5 K/uL 8.0  10.1  9.6   Hemoglobin 13.0 - 17.0 g/dL 13.8  14.5  13.8   Hematocrit 39.0 - 52.0 % 41.2  42.5  41.4   Platelets 150 - 400 K/uL 191  245  188     .    Latest Ref Rng & Units 12/19/2022   11:55 AM 12/05/2022   11:13 AM 11/21/2022    8:38 AM  CMP   Glucose 70 - 99 mg/dL 153  190  230   BUN 6 - 20 mg/dL '15  15  16   '$ Creatinine 0.61 - 1.24 mg/dL 0.82  0.67  0.69   Sodium 135 - 145 mmol/L 139  140  140   Potassium 3.5 - 5.1 mmol/L 3.6  3.6  3.6   Chloride 98 - 111 mmol/L 105  105  105   CO2 22 - 32 mmol/L '29  31  26   '$ Calcium 8.9 - 10.3 mg/dL 9.1  9.8  9.3  Total Protein 6.5 - 8.1 g/dL 6.4  6.6  6.6   Total Bilirubin 0.3 - 1.2 mg/dL 0.5  0.6  0.5   Alkaline Phos 38 - 126 U/L 50  63  61   AST 15 - 41 U/L '14  14  16   '$ ALT 0 - 44 U/L '23  22  21    '$ . Lab Results  Component Value Date   LDH 114 10/10/2022      RADIOGRAPHIC STUDIES: I have personally reviewed the radiological images as listed and agreed with the findings in the report. No results found.  ASSESSMENT & PLAN:   1) R-ISS stage III high risk IgG Lambda Multiple myeloma not on treatment for more than 1 year due to patient's lack of follow-up.  Patient diagnosed December 2021 and received 1 cycle of CyBorD. Had previously presented with anemia renal insufficiency and hyperviscosity symptoms. Bx- 90% plasma cells Initial M spike 8.03g/dl Del(1p): Not Detected  Dup(1q): Not Detected  Gains(15): DETECTED  Gains(5 and 9): Not Detected  Del(13q)/-13: DETECTED  Del(17p)(TP53): Not Detected  IGH(Rearrangement): SEE BELOW   IgH complex: t(4;14): DETECTED  t(11;14): Not Detected  t(14;16): Not Detected  t(14;20): Not Detected   Cytogenetics: Normal male karyotype.   -Labs from 05/13/2022-beta-2 microglobulin 4.4, LDH 197, lambda free light chain 77.1, kappa, lambda light chain ratio 0.08, multiple myeloma panel pending.  UPEP ordered but not yet collected. 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  Due to multiple myeloma   PLAN  -discussed lab results from 01/02/23 with patient. CMP stable. CBC showed WBC of 6.9 K, hemoglobin improved to 13.9 K, and platelets of 197 K. CMP stable. -discussed 12/19/22 myeloma lab, which revealed 0.1 g/dL of myeloma protein -pt confirms that he would not like to receive an autologous bone marrow transplantation at this time -recommended full Suarez Pet scan and bone marrow biopsy to confirm -recommended that if there is no sign of active cancer, then drop infusions to once a month -continue pills to keep cancer under control -order full Suarez PET scan and bone marrow biopsy -answered all of patients questions regarding his illness -Continue acyclovir 400 MG tablet to prevent shingles -Continue aspirin 81 MG tablet daily to prevent blood clots -Patient has been receiving his Revlimid now from the cancer center where it is delivered and is tolerating his current dose of Revlimid 25 mg,  21 days on 1 week off -Continue daratumumab every 2 weeks. -Continue same supportive medications. -Based on PET/CT and bone marrow biopsy results we will decide when to transition him to maintenance consolidative treatment with monthly [facility] and lower -dose of Revlimid. Follow-up -Plz schedule Daratumumab with labs every 2 weeks x 6 -per integrated scheduling  -CT bone marrow biopsy in 2 weeks PET/CT in 2 weeks -MD visit in 4 weeks -continue Zometa every 4 weeks x 6  The total time spent in the appointment was 32 minutes* .  All of the patient's questions were answered with apparent satisfaction. The patient knows to call the clinic with any problems, questions or concerns.  Donald Lone MD MS AAHIVMS [facility] Hematology/Oncology Physician [facility]  .*Total Encounter Time as defined by the Centers for Medicare and Medicaid Services includes, in addition to the face-to-face time of a patient visit (documented in the note above) non-face-to-face time: obtaining and reviewing outside history, ordering and reviewing medications, tests or procedures, care coordination (communications with other health care professionals or caregivers) and documentation in the medical record.   I,Donald Suarez,acting as a Education administrator for Donald Lone, MD.,have documented all relevant documentation on the behalf of Donald Lone, MD,as directed by  Donald Lone, MD while in the presence of Donald Lone, MD.  .I have reviewed the above documentation for accuracy and completeness, and I agree with the above. Donald Genera MD

## 2023-01-07 ENCOUNTER — Other Ambulatory Visit: Payer: Self-pay

## 2023-01-08 ENCOUNTER — Encounter: Payer: Self-pay | Admitting: Hematology

## 2023-01-13 ENCOUNTER — Other Ambulatory Visit: Payer: Self-pay

## 2023-01-16 ENCOUNTER — Other Ambulatory Visit: Payer: Self-pay

## 2023-01-16 ENCOUNTER — Inpatient Hospital Stay: Payer: BC Managed Care – PPO

## 2023-01-16 VITALS — BP 135/78 | HR 77 | Temp 98.0°F | Resp 18 | Ht 66.0 in | Wt 171.1 lb

## 2023-01-16 DIAGNOSIS — Z7189 Other specified counseling: Secondary | ICD-10-CM

## 2023-01-16 DIAGNOSIS — C9000 Multiple myeloma not having achieved remission: Secondary | ICD-10-CM

## 2023-01-16 LAB — CBC WITH DIFFERENTIAL (CANCER CENTER ONLY)
Abs Immature Granulocytes: 0.02 10*3/uL (ref 0.00–0.07)
Basophils Absolute: 0.2 10*3/uL — ABNORMAL HIGH (ref 0.0–0.1)
Basophils Relative: 2 %
Eosinophils Absolute: 0 10*3/uL (ref 0.0–0.5)
Eosinophils Relative: 0 %
HCT: 43 % (ref 39.0–52.0)
Hemoglobin: 14.4 g/dL (ref 13.0–17.0)
Immature Granulocytes: 0 %
Lymphocytes Relative: 37 %
Lymphs Abs: 3.2 10*3/uL (ref 0.7–4.0)
MCH: 26.5 pg (ref 26.0–34.0)
MCHC: 33.5 g/dL (ref 30.0–36.0)
MCV: 79.2 fL — ABNORMAL LOW (ref 80.0–100.0)
Monocytes Absolute: 0.5 10*3/uL (ref 0.1–1.0)
Monocytes Relative: 6 %
Neutro Abs: 4.7 10*3/uL (ref 1.7–7.7)
Neutrophils Relative %: 55 %
Platelet Count: 260 10*3/uL (ref 150–400)
RBC: 5.43 MIL/uL (ref 4.22–5.81)
RDW: 14.6 % (ref 11.5–15.5)
WBC Count: 8.7 10*3/uL (ref 4.0–10.5)
nRBC: 0 % (ref 0.0–0.2)

## 2023-01-16 LAB — COMPREHENSIVE METABOLIC PANEL
ALT: 22 U/L (ref 0–44)
AST: 16 U/L (ref 15–41)
Albumin: 4.1 g/dL (ref 3.5–5.0)
Alkaline Phosphatase: 42 U/L (ref 38–126)
Anion gap: 11 (ref 5–15)
BUN: 17 mg/dL (ref 6–20)
CO2: 25 mmol/L (ref 22–32)
Calcium: 9.3 mg/dL (ref 8.9–10.3)
Chloride: 101 mmol/L (ref 98–111)
Creatinine, Ser: 0.74 mg/dL (ref 0.61–1.24)
GFR, Estimated: >60 mL/min (ref >60)
Glucose, Bld: 246 mg/dL — ABNORMAL HIGH (ref 70–99)
Potassium: 3.3 mmol/L — ABNORMAL LOW (ref 3.5–5.1)
Sodium: 137 mmol/L (ref 135–145)
Total Bilirubin: 0.9 mg/dL (ref 0.3–1.2)
Total Protein: 6.4 g/dL — ABNORMAL LOW (ref 6.5–8.1)

## 2023-01-16 MED ORDER — DIPHENHYDRAMINE HCL 25 MG PO CAPS
50.0000 mg | ORAL_CAPSULE | Freq: Once | ORAL | Status: AC
  Administered 2023-01-16: 50 mg via ORAL
  Filled 2023-01-16: qty 2

## 2023-01-16 MED ORDER — SODIUM CHLORIDE 0.9% FLUSH: 10.0000 mL | INTRAVENOUS | Status: DC | PRN

## 2023-01-16 MED ORDER — MONTELUKAST SODIUM 10 MG PO TABS
10.0000 mg | ORAL_TABLET | Freq: Once | ORAL | Status: AC
  Administered 2023-01-16: 10 mg via ORAL
  Filled 2023-01-16: qty 1

## 2023-01-16 MED ORDER — SODIUM CHLORIDE 0.9 % IV SOLN: Freq: Once | INTRAVENOUS | Status: AC

## 2023-01-16 MED ORDER — FAMOTIDINE IN NACL 20-0.9 MG/50ML-% IV SOLN
20.0000 mg | Freq: Once | INTRAVENOUS | Status: AC
  Administered 2023-01-16: 20 mg via INTRAVENOUS
  Filled 2023-01-16: qty 50

## 2023-01-16 MED ORDER — HEPARIN SOD (PORK) LOCK FLUSH 100 UNIT/ML IV SOLN: 500.0000 [IU] | Freq: Once | INTRAVENOUS | Status: DC | PRN

## 2023-01-16 MED ORDER — SODIUM CHLORIDE 0.9 % IV SOLN
12.0000 mg | Freq: Once | INTRAVENOUS | Status: AC
  Administered 2023-01-16: 12 mg via INTRAVENOUS
  Filled 2023-01-16: qty 1.2

## 2023-01-16 MED ORDER — SODIUM CHLORIDE 0.9 % IV SOLN
16.0000 mg/kg | Freq: Once | INTRAVENOUS | Status: AC
  Administered 2023-01-16: 1100 mg via INTRAVENOUS
  Filled 2023-01-16: qty 40

## 2023-01-16 MED ORDER — ACETAMINOPHEN 325 MG PO TABS
650.0000 mg | ORAL_TABLET | Freq: Once | ORAL | Status: AC
  Administered 2023-01-16: 650 mg via ORAL
  Filled 2023-01-16: qty 2

## 2023-01-16 MED ORDER — ZOLEDRONIC ACID 4 MG/100ML IV SOLN
4.0000 mg | Freq: Once | INTRAVENOUS | Status: AC
  Administered 2023-01-16: 4 mg via INTRAVENOUS
  Filled 2023-01-16: qty 100

## 2023-01-16 NOTE — Patient Instructions (Signed)
[facility]  Discharge Instructions: Thank you for choosing [facility] to provide your oncology and hematology care.   If you have a lab appointment with the [facility], please go directly to the [facility] and check in at the registration area.   Wear comfortable clothing and clothing appropriate for easy access to any Portacath or PICC line.   We strive to give you quality time with your provider. You may need to reschedule your appointment if you arrive late (15 or more minutes).  Arriving late affects you and other patients whose appointments are after yours.  Also, if you miss three or more appointments without notifying the office, you may be dismissed from the clinic at the provider's discretion.      For prescription refill requests, have your pharmacy contact our office and allow 72 hours for refills to be completed.    Today you received the following chemotherapy and/or immunotherapy agents: Darzalex      To help prevent nausea and vomiting after your treatment, we encourage you to take your nausea medication as directed.  BELOW ARE SYMPTOMS THAT SHOULD BE REPORTED IMMEDIATELY: *FEVER GREATER THAN 100.4 F (38 C) OR HIGHER *CHILLS OR SWEATING *NAUSEA AND VOMITING THAT IS NOT CONTROLLED WITH YOUR NAUSEA MEDICATION *UNUSUAL SHORTNESS OF BREATH *UNUSUAL BRUISING OR BLEEDING *URINARY PROBLEMS (pain or burning when urinating, or frequent urination) *BOWEL PROBLEMS (unusual diarrhea, constipation, pain near the anus) TENDERNESS IN MOUTH AND THROAT WITH OR WITHOUT PRESENCE OF ULCERS (sore throat, sores in mouth, or a toothache) UNUSUAL RASH, SWELLING OR PAIN  UNUSUAL VAGINAL DISCHARGE OR ITCHING   Items with * indicate a potential emergency and should be followed up as soon as possible or go to the Emergency Department if any problems should occur.  Please show the CHEMOTHERAPY ALERT CARD or IMMUNOTHERAPY ALERT CARD at  check-in to the Emergency Department and triage nurse.  Should you have questions after your visit or need to cancel or reschedule your appointment, please contact [facility]  Dept: [phone]  and follow the prompts.  Office hours are 8:00 a.m. to 4:30 p.m. Monday - Friday. Please note that voicemails left after 4:00 p.m. may not be returned until the following business day.  We are closed weekends and major holidays. You have access to a nurse at all times for urgent questions. Please call the main number to the clinic Dept: [phone] and follow the prompts.   For any non-urgent questions, you may also contact your provider using MyChart. We now offer e-Visits for anyone 41 and older to request care online for non-urgent symptoms. For details visit [email or web address].   Also download the MyChart app! Go to the app store, search "MyChart", open the app, select [facility], and log in with your MyChart username and password.  Masks are optional in the cancer centers. If you would like for your care team to wear a mask while they are taking care of you, please let them know. You may have one support person who is at least 56 years old accompany you for your appointments. Zoledronic Acid Injection (Cancer) What is this medication? ZOLEDRONIC ACID (ZOE le dron ik AS id) treats high calcium levels in the blood caused by cancer. It may also be used with chemotherapy to treat weakened bones caused by cancer. It works by slowing down the release of calcium from bones. This lowers calcium levels in your blood.  It also makes your bones stronger and less likely to break (fracture). It belongs to a group of medications called bisphosphonates. This medicine may be used for other purposes; ask your health care provider or pharmacist if you have questions. COMMON BRAND NAME(S): Zometa, Zometa Powder What should I tell my care team before I take this medication? They  need to know if you have any of these conditions: Dehydration Dental disease Kidney disease Liver disease Low levels of calcium in the blood Lung or breathing disease, such as asthma Receiving steroids, such as dexamethasone or prednisone An unusual or allergic reaction to zoledronic acid, other medications, foods, dyes, or preservatives Pregnant or trying to get pregnant Breast-feeding How should I use this medication? This medication is injected into a vein. It is given by your care team in a hospital or clinic setting. Talk to your care team about the use of this medication in children. Special care may be needed. Overdosage: If you think you have taken too much of this medicine contact a poison control center or emergency room at once. NOTE: This medicine is only for you. Do not share this medicine with others. What if I miss a dose? Keep appointments for follow-up doses. It is important not to miss your dose. Call your care team if you are unable to keep an appointment. What may interact with this medication? Certain antibiotics given by injection Diuretics, such as bumetanide, furosemide NSAIDs, medications for pain and inflammation, such as ibuprofen or naproxen Teriparatide Thalidomide This list may not describe all possible interactions. Give your health care provider a list of all the medicines, herbs, non-prescription drugs, or dietary supplements you use. Also tell them if you smoke, drink alcohol, or use illegal drugs. Some items may interact with your medicine. What should I watch for while using this medication? Visit your care team for regular checks on your progress. It may be some time before you see the benefit from this medication. Some people who take this medication have severe bone, joint, or muscle pain. This medication may also increase your risk for jaw problems or a broken thigh bone. Tell your care team right away if you have severe pain in your jaw, bones,  joints, or muscles. Tell you care team if you have any pain that does not go away or that gets worse. Tell your dentist and dental surgeon that you are taking this medication. You should not have major dental surgery while on this medication. See your dentist to have a dental exam and fix any dental problems before starting this medication. Take good care of your teeth while on this medication. Make sure you see your dentist for regular follow-up appointments. You should make sure you get enough calcium and vitamin D while you are taking this medication. Discuss the foods you eat and the vitamins you take with your care team. Check with your care team if you have severe diarrhea, nausea, and vomiting, or if you sweat a lot. The loss of too much body fluid may make it dangerous for you to take this medication. You may need bloodwork while taking this medication. Talk to your care team if you wish to become pregnant or think you might be pregnant. This medication can cause serious birth defects. What side effects may I notice from receiving this medication? Side effects that you should report to your care team as soon as possible: Allergic reactions--skin rash, itching, hives, swelling of the face, lips, tongue, or throat Kidney injury--decrease  in the amount of urine, swelling of the ankles, hands, or feet Low calcium level--muscle pain or cramps, confusion, tingling, or numbness in the hands or feet Osteonecrosis of the jaw--pain, swelling, or redness in the mouth, numbness of the jaw, poor healing after dental work, unusual discharge from the mouth, visible bones in the mouth Severe bone, joint, or muscle pain Side effects that usually do not require medical attention (report to your care team if they continue or are bothersome): Constipation Fatigue Fever Loss of appetite Nausea Stomach pain This list may not describe all possible side effects. Call your doctor for medical advice about side  effects. You may report side effects to FDA at 1-800-FDA-1088. Where should I keep my medication? This medication is given in a hospital or clinic. It will not be stored at home. NOTE: This sheet is a summary. It may not cover all possible information. If you have questions about this medicine, talk to your doctor, pharmacist, or health care provider.  2023 Elsevier/Gold Standard (2022-01-30 00:00:00)

## 2023-01-20 LAB — MULTIPLE MYELOMA PANEL, SERUM
Albumin SerPl Elph-Mcnc: 3.7 g/dL (ref 2.9–4.4)
Albumin/Glob SerPl: 1.5 (ref 0.7–1.7)
Alpha 1: 0.1 g/dL (ref 0.0–0.4)
Alpha2 Glob SerPl Elph-Mcnc: 0.9 g/dL (ref 0.4–1.0)
B-Globulin SerPl Elph-Mcnc: 0.9 g/dL (ref 0.7–1.3)
Gamma Glob SerPl Elph-Mcnc: 0.6 g/dL (ref 0.4–1.8)
Globulin, Total: 2.5 g/dL (ref 2.2–3.9)
IgA: 85 mg/dL — ABNORMAL LOW (ref 90–386)
IgG (Immunoglobin G), Serum: 605 mg/dL (ref 603–1613)
IgM (Immunoglobulin M), Srm: 27 mg/dL (ref 20–172)
M Protein SerPl Elph-Mcnc: 0.1 g/dL — ABNORMAL HIGH
Total Protein ELP: 6.2 g/dL (ref 6.0–8.5)

## 2023-01-22 NOTE — Progress Notes (Signed)
Pt given Revlimid on 01/16/23. Pt to start his 21 days on that day. Pt acknowledged and verbalized understanding. 01/16/23 - 02/05/23 pt to take revlimid, 02/06/23 - 02/12/23 pt to take off. Pt will need to restart on 02/13/23

## 2023-01-27 ENCOUNTER — Other Ambulatory Visit: Payer: Self-pay | Admitting: Hematology

## 2023-01-27 DIAGNOSIS — C9000 Multiple myeloma not having achieved remission: Secondary | ICD-10-CM

## 2023-01-30 ENCOUNTER — Inpatient Hospital Stay (HOSPITAL_BASED_OUTPATIENT_CLINIC_OR_DEPARTMENT_OTHER): Payer: BC Managed Care – PPO | Admitting: Hematology

## 2023-01-30 ENCOUNTER — Other Ambulatory Visit: Payer: Self-pay

## 2023-01-30 ENCOUNTER — Encounter: Payer: Self-pay | Admitting: Hematology

## 2023-01-30 ENCOUNTER — Inpatient Hospital Stay: Payer: BC Managed Care – PPO

## 2023-01-30 ENCOUNTER — Ambulatory Visit: Payer: BC Managed Care – PPO

## 2023-01-30 ENCOUNTER — Inpatient Hospital Stay: Payer: BC Managed Care – PPO | Attending: Hematology

## 2023-01-30 VITALS — BP 138/78 | HR 70 | Temp 98.2°F | Resp 16 | Wt 168.5 lb

## 2023-01-30 DIAGNOSIS — C9000 Multiple myeloma not having achieved remission: Secondary | ICD-10-CM

## 2023-01-30 DIAGNOSIS — G629 Polyneuropathy, unspecified: Secondary | ICD-10-CM | POA: Diagnosis not present

## 2023-01-30 DIAGNOSIS — D6959 Other secondary thrombocytopenia: Secondary | ICD-10-CM | POA: Diagnosis not present

## 2023-01-30 DIAGNOSIS — Z7189 Other specified counseling: Secondary | ICD-10-CM

## 2023-01-30 DIAGNOSIS — Z5112 Encounter for antineoplastic immunotherapy: Secondary | ICD-10-CM | POA: Insufficient documentation

## 2023-01-30 DIAGNOSIS — D751 Secondary polycythemia: Secondary | ICD-10-CM | POA: Insufficient documentation

## 2023-01-30 DIAGNOSIS — C9001 Multiple myeloma in remission: Secondary | ICD-10-CM

## 2023-01-30 DIAGNOSIS — Z79899 Other long term (current) drug therapy: Secondary | ICD-10-CM | POA: Diagnosis not present

## 2023-01-30 LAB — CBC WITH DIFFERENTIAL (CANCER CENTER ONLY)
Abs Immature Granulocytes: 0.03 10*3/uL (ref 0.00–0.07)
Basophils Absolute: 0.1 10*3/uL (ref 0.0–0.1)
Basophils Relative: 1 %
Eosinophils Absolute: 0.3 10*3/uL (ref 0.0–0.5)
Eosinophils Relative: 3 %
HCT: 41 % (ref 39.0–52.0)
Hemoglobin: 14 g/dL (ref 13.0–17.0)
Immature Granulocytes: 0 %
Lymphocytes Relative: 29 %
Lymphs Abs: 2.9 10*3/uL (ref 0.7–4.0)
MCH: 26.9 pg (ref 26.0–34.0)
MCHC: 34.1 g/dL (ref 30.0–36.0)
MCV: 78.8 fL — ABNORMAL LOW (ref 80.0–100.0)
Monocytes Absolute: 1 10*3/uL (ref 0.1–1.0)
Monocytes Relative: 10 %
Neutro Abs: 5.8 10*3/uL (ref 1.7–7.7)
Neutrophils Relative %: 57 %
Platelet Count: 191 10*3/uL (ref 150–400)
RBC: 5.2 MIL/uL (ref 4.22–5.81)
RDW: 13.7 % (ref 11.5–15.5)
WBC Count: 10.2 10*3/uL (ref 4.0–10.5)
nRBC: 0 % (ref 0.0–0.2)

## 2023-01-30 LAB — COMPREHENSIVE METABOLIC PANEL
ALT: 21 U/L (ref 0–44)
AST: 12 U/L — ABNORMAL LOW (ref 15–41)
Albumin: 3.9 g/dL (ref 3.5–5.0)
Alkaline Phosphatase: 48 U/L (ref 38–126)
Anion gap: 8 (ref 5–15)
BUN: 17 mg/dL (ref 6–20)
CO2: 27 mmol/L (ref 22–32)
Calcium: 8.9 mg/dL (ref 8.9–10.3)
Chloride: 101 mmol/L (ref 98–111)
Creatinine, Ser: 0.76 mg/dL (ref 0.61–1.24)
GFR, Estimated: >60 mL/min (ref >60)
Glucose, Bld: 267 mg/dL — ABNORMAL HIGH (ref 70–99)
Potassium: 3.3 mmol/L — ABNORMAL LOW (ref 3.5–5.1)
Sodium: 136 mmol/L (ref 135–145)
Total Bilirubin: 0.4 mg/dL (ref 0.3–1.2)
Total Protein: 6.2 g/dL — ABNORMAL LOW (ref 6.5–8.1)

## 2023-01-30 MED ORDER — ACETAMINOPHEN 325 MG PO TABS
650.0000 mg | ORAL_TABLET | Freq: Once | ORAL | Status: AC
  Administered 2023-01-30: 650 mg via ORAL
  Filled 2023-01-30: qty 2

## 2023-01-30 MED ORDER — FAMOTIDINE IN NACL 20-0.9 MG/50ML-% IV SOLN
20.0000 mg | Freq: Once | INTRAVENOUS | Status: AC
  Administered 2023-01-30: 20 mg via INTRAVENOUS
  Filled 2023-01-30: qty 50

## 2023-01-30 MED ORDER — DIPHENHYDRAMINE HCL 25 MG PO CAPS
50.0000 mg | ORAL_CAPSULE | Freq: Once | ORAL | Status: AC
  Administered 2023-01-30: 50 mg via ORAL
  Filled 2023-01-30: qty 2

## 2023-01-30 MED ORDER — SODIUM CHLORIDE 0.9 % IV SOLN: Freq: Once | INTRAVENOUS | Status: AC

## 2023-01-30 MED ORDER — SODIUM CHLORIDE 0.9 % IV SOLN
16.0000 mg/kg | Freq: Once | INTRAVENOUS | Status: AC
  Administered 2023-01-30: 1100 mg via INTRAVENOUS
  Filled 2023-01-30: qty 15

## 2023-01-30 MED ORDER — SODIUM CHLORIDE 0.9 % IV SOLN
12.0000 mg | Freq: Once | INTRAVENOUS | Status: AC
  Administered 2023-01-30: 12 mg via INTRAVENOUS
  Filled 2023-01-30: qty 1.2

## 2023-01-30 MED ORDER — MONTELUKAST SODIUM 10 MG PO TABS
10.0000 mg | ORAL_TABLET | Freq: Once | ORAL | Status: AC
  Administered 2023-01-30: 10 mg via ORAL
  Filled 2023-01-30: qty 1

## 2023-01-30 NOTE — Progress Notes (Signed)
HEMATOLOGY/ONCOLOGY CLINIC NOTE  Date of Service: 01/30/23   Chief complaint - Follow-up for continued evaluation and management of high risk multiple myeloma  Current treatment-daratumumab/Revlimid/dexamethasone Has not f/u on and does not appear keen to consider Melphalan/HDT-AUTOHSCT  INTERVAL HISTORY:  Donald Suarez is a 57 y.o. male is here with his [name] interpreter for continued evaluation and management of his multiple myeloma.   Patient was last seen by me on 01/02/23 and was doing well overall with no new medical concerns.  Today, he is accompanied by an interpreter and is doing well overall.  No new concerns, new bone pain or headaches. No fever, chills, nights sweats or infection issues. No back, mouth, or abdominal pain.  He is UTD with his influenza and COVID-19 booster vaccination. He  has not received the RSV vaccination yet as he is not 60 years yet. He has not yet received the Shingles vaccination.  He reports that his activity level is okay and is eating well. He reports that he is sleeping well and his energy level is normal.  He reports that he is tolerating his medication well. He denies any diarrhea or nausea. No tingling or numbness of bone pains from Zometa.    MEDICAL HISTORY:  Past Medical History:  Diagnosis Date   Anemia    Elevated ferritin 08/07/2022   Hypocalcemia 08/19/2022   In setting of multiple myeloma    Microalbuminuria 08/19/2022   Peripheral neuropathy 09/08/2022   Started 08/2022 A/w diabetes and revlimid usage Burning on top of left foot    SURGICAL HISTORY: Past Surgical History:  Procedure Laterality Date   APPENDECTOMY     [place] FLUORO GUIDE CV LINE RIGHT  05/15/2022   [facility] PATIENT EVAL TECH 0-60 MINS  05/19/2022   [facility] US GUIDE VASC ACCESS RIGHT  05/15/2022    SOCIAL HISTORY: Social History   Socioeconomic History   Marital status: Married    Spouse name: Not on file   Number of children: Not on file   Years of education:  Not on file   Highest education level: Not on file  Occupational History   Not on file  Tobacco Use   Smoking status: Never   Smokeless tobacco: Never  Vaping Use   Vaping Use: Never used  Substance and Sexual Activity   Alcohol use: Never   Drug use: Never   Sexual activity: Not on file  Other Topics Concern   Not on file  Social History Narrative   Not on file   Social Determinants of Health   Financial Resource Strain: Medium Risk (05/21/2022)   Overall Financial Resource Strain (CARDIA)    Difficulty of Paying Living Expenses: Somewhat hard  Food Insecurity: Food Insecurity Present (05/21/2022)   Hunger Vital Sign    Worried About Running Out of Food in the Last Year: Sometimes true    Ran Out of Food in the Last Year: Sometimes true  Transportation Needs: No Transportation Needs (05/21/2022)   PRAPARE - Hydrologist (Medical): No    Lack of Transportation (Non-Medical): No  Physical Activity: Not on file  Stress: Not on file  Social Connections: Not on file  Intimate Partner Violence: Not on file    FAMILY HISTORY: No family history on file.  ALLERGIES:  has No Known Allergies.  MEDICATIONS:  . Allergies as of 01/30/2023   No Known Allergies      Medication List        Accurate  as of January 30, 2023 11:51 AM. If you have any questions, ask your nurse or doctor.          Accu-Chek Guide test strip Generic drug: glucose blood Use to test blood sugars up to 4 times daily as directed.   Accu-Chek Guide w/Device Kit Use to test blood sugars up to 4 times daily as directed.   Accu-Chek Softclix Lancets lancets Use to test blood sugars up to 4 times daily as directed.   acyclovir 400 MG tablet Commonly known as: ZOVIRAX Take 1 tablet (400 mg total) by mouth 2 (two) times daily.   aspirin EC 81 MG tablet Take 1 tablet (81 mg total) by mouth daily. Swallow whole.   B-12 1000 MCG Caps Take 1 tablet by mouth daily.    FreeStyle Libre 3 Sensor Misc 1 Application by Does not apply route every 14 (fourteen) days. Place 1 sensor on the skin every 14 days. Use to check glucose continuously   gabapentin 300 MG capsule Commonly known as: NEURONTIN Take 1 capsule (300 mg total) by mouth 3 (three) times daily.   lenalidomide 25 MG capsule Commonly known as: REVLIMID TAKE 1 CAPSULE BY MOUTH 1 TIME A DAY FOR 21 DAYS ON THEN 7 DAYS OFF   metFORMIN 500 MG tablet Commonly known as: GLUCOPHAGE Take 1 tablet (500 mg total) by mouth 2 (two) times daily with a meal. To start take just half tablet twice daily before meals for 3 days   ondansetron 8 MG tablet Commonly known as: Zofran Take 1 tablet (8 mg total) by mouth every 8 (eight) hours as needed for nausea or vomiting.   prochlorperazine 10 MG tablet Commonly known as: COMPAZINE Take 1 tablet (10 mg total) by mouth every 6 (six) hours as needed for nausea or vomiting.         REVIEW OF SYSTEMS:    10 Point review of Systems was done is negative except as noted above.    PHYSICAL EXAMINATION: VS stable GENERAL:alert, in no acute distress and comfortable SKIN: no acute rashes, no significant lesions EYES: conjunctiva are pink and non-injected, sclera anicteric OROPHARYNX: MMM, no exudates, no oropharyngeal erythema or ulceration NECK: supple, no JVD LYMPH:  no palpable lymphadenopathy in the cervical, axillary or inguinal regions LUNGS: clear to auscultation b/l with normal respiratory effort HEART: regular rate & rhythm ABDOMEN:  normoactive bowel sounds , non tender, not distended. Extremity: no pedal edema PSYCH: alert & oriented x 3 with fluent speech NEURO: no focal motor/sensory deficits    LABORATORY DATA:  I have reviewed the data as listed  .    Latest Ref Rng & Units 01/30/2023   11:38 AM 01/16/2023   12:22 PM 01/02/2023   12:50 PM  CBC  WBC 4.0 - 10.5 K/uL 10.2  8.7  6.7   Hemoglobin 13.0 - 17.0 g/dL 14.0  14.4  13.9    Hematocrit 39.0 - 52.0 % 41.0  43.0  41.1   Platelets 150 - 400 K/uL 191  260  197     .    Latest Ref Rng & Units 01/30/2023   11:38 AM 01/16/2023   12:22 PM 01/02/2023   12:50 PM  CMP  Glucose 70 - 99 mg/dL 267  246  202   BUN 6 - 20 mg/dL '17  17  11   '$ Creatinine 0.61 - 1.24 mg/dL 0.76  0.74  0.69   Sodium 135 - 145 mmol/L 136  137  138  Potassium 3.5 - 5.1 mmol/L 3.3  3.3  3.4   Chloride 98 - 111 mmol/L 101  101  104   CO2 22 - 32 mmol/L '27  25  28   '$ Calcium 8.9 - 10.3 mg/dL 8.9  9.3  8.9   Total Protein 6.5 - 8.1 g/dL 6.2  6.4  6.0   Total Bilirubin 0.3 - 1.2 mg/dL 0.4  0.9  0.6   Alkaline Phos 38 - 126 U/L 48  42  50   AST 15 - 41 U/L '12  16  14   '$ ALT 0 - 44 U/L '21  22  25    '$ . Lab Results  Component Value Date   LDH 114 10/10/2022   RADIOGRAPHIC STUDIES: I have personally reviewed the radiological images as listed and agreed with the findings in the report. No results found.  ASSESSMENT & PLAN:   1) R-ISS stage III high risk IgG Lambda Multiple myeloma not on treatment for more than 1 year due to patient's lack of follow-up.  Patient diagnosed December 2021 and received 1 cycle of CyBorD. Had previously presented with anemia renal insufficiency and hyperviscosity symptoms. Bx- 90% plasma cells Initial M spike 8.03g/dl Del(1p): Not Detected  Dup(1q): Not Detected  Gains(15): DETECTED  Gains(5 and 9): Not Detected  Del(13q)/-13: DETECTED  Del(17p)(TP53): Not Detected  IGH(Rearrangement): SEE BELOW   IgH complex: t(4;14): DETECTED  t(11;14): Not Detected  t(14;16): Not Detected  t(14;20): Not Detected   Cytogenetics: Normal male karyotype.   -Labs from 05/13/2022-beta-2 microglobulin 4.4, LDH 197, lambda free light chain 77.1, kappa, lambda light chain ratio 0.08, multiple myeloma panel pending.  UPEP ordered but not yet collected. 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  Due to multiple myeloma   PLAN -schedule CT bone marrow biopsy and Pet scan in the next 2-4 weeks as they have not yet been completed -If pet scan and CT bone marrow biopsy show no active cancer in bones, then we can adjust treatment to once a month instead of once every 2 weeks. Medication may be reduced as well. -Discussed lab results on 01/30/23  with patient. Blood counts normal. CBC showed WBC of 10.2 K, hemoglobin of 14, and platelets of 191. -Recent myeloma lab showed small amount of abnormal protein: 0.1g/dL. Nearly zero indicating that he is in remission at this time -CMP pending -recommended pt to receive shingles vaccination  Follow-up -Plz schedule Daratumumab with labs every 2 weeks x 6 -per integrated scheduling  -CT bone marrow biopsy in 2 weeks PET/CT in 2 weeks -MD visit in 4 weeks -continue Zometa every 4 weeks x 6  The total time spent in the appointment was 32 minutes* .  All of the patient's questions were answered with apparent satisfaction. The patient knows to call the clinic with any problems, questions or concerns.   Sullivan Lone MD MS AAHIVMS [facility] Hematology/Oncology Physician [facility]  .*Total Encounter Time as defined by the Centers for Medicare and Medicaid Services includes, in addition to the face-to-face time of a patient visit (  documented in the note above) non-face-to-face time: obtaining and reviewing outside history, ordering and reviewing medications, tests or procedures, care coordination (communications with other health care  professionals or caregivers) and documentation in the medical record.   I,Mitra Faeizi,acting as a Education administrator for Sullivan Lone, MD.,have documented all relevant documentation on the behalf of Sullivan Lone, MD,as directed by  Sullivan Lone, MD while in the presence of Sullivan Lone, MD.  .I have reviewed the above documentation for accuracy and completeness, and I agree with the above. Brunetta Genera MD

## 2023-01-30 NOTE — Patient Instructions (Signed)
[facility]  Discharge Instructions: Thank you for choosing [facility] to provide your oncology and hematology care.   If you have a lab appointment with the [facility], please go directly to the [facility] and check in at the registration area.   Wear comfortable clothing and clothing appropriate for easy access to any Portacath or PICC line.   We strive to give you quality time with your provider. You may need to reschedule your appointment if you arrive late (15 or more minutes).  Arriving late affects you and other patients whose appointments are after yours.  Also, if you miss three or more appointments without notifying the office, you may be dismissed from the clinic at the provider's discretion.      For prescription refill requests, have your pharmacy contact our office and allow 72 hours for refills to be completed.    Today you received the following chemotherapy and/or immunotherapy agents darzalex      To help prevent nausea and vomiting after your treatment, we encourage you to take your nausea medication as directed.  BELOW ARE SYMPTOMS THAT SHOULD BE REPORTED IMMEDIATELY: *FEVER GREATER THAN 100.4 F (38 C) OR HIGHER *CHILLS OR SWEATING *NAUSEA AND VOMITING THAT IS NOT CONTROLLED WITH YOUR NAUSEA MEDICATION *UNUSUAL SHORTNESS OF BREATH *UNUSUAL BRUISING OR BLEEDING *URINARY PROBLEMS (pain or burning when urinating, or frequent urination) *BOWEL PROBLEMS (unusual diarrhea, constipation, pain near the anus) TENDERNESS IN MOUTH AND THROAT WITH OR WITHOUT PRESENCE OF ULCERS (sore throat, sores in mouth, or a toothache) UNUSUAL RASH, SWELLING OR PAIN  UNUSUAL VAGINAL DISCHARGE OR ITCHING   Items with * indicate a potential emergency and should be followed up as soon as possible or go to the Emergency Department if any problems should occur.  Please show the CHEMOTHERAPY ALERT CARD or IMMUNOTHERAPY ALERT CARD at  check-in to the Emergency Department and triage nurse.  Should you have questions after your visit or need to cancel or reschedule your appointment, please contact [facility]  Dept: [phone]  and follow the prompts.  Office hours are 8:00 a.m. to 4:30 p.m. Monday - Friday. Please note that voicemails left after 4:00 p.m. may not be returned until the following business day.  We are closed weekends and major holidays. You have access to a nurse at all times for urgent questions. Please call the main number to the clinic Dept: [phone] and follow the prompts.   For any non-urgent questions, you may also contact your provider using MyChart. We now offer e-Visits for anyone 18 and older to request care online for non-urgent symptoms. For details visit [email or web address].   Also download the MyChart app! Go to the app store, search "MyChart", open the app, select [facility], and log in with your MyChart username and password.

## 2023-02-05 ENCOUNTER — Encounter: Payer: Self-pay | Admitting: Hematology

## 2023-02-10 ENCOUNTER — Other Ambulatory Visit: Payer: Self-pay | Admitting: Radiology

## 2023-02-10 DIAGNOSIS — C9000 Multiple myeloma not having achieved remission: Secondary | ICD-10-CM

## 2023-02-10 NOTE — H&P (Incomplete)
Referring Physician(s): Brunetta Genera  Supervising Physician: Sandi Mariscal  Patient Status:  [facility]  Chief Complaint:  "I'm getting a bone marrow biopsy"  Subjective: Pt known to [facility] team from temp HD cath placement on 05/15/22. He has a hx of high risk multiple myeloma and presents today for image guided bone marrow biopsy to assess response to treatment.    Past Medical History:  Diagnosis Date   Anemia    Elevated ferritin 08/07/2022   Hypocalcemia 08/19/2022   In setting of multiple myeloma    Microalbuminuria 08/19/2022   Peripheral neuropathy 09/08/2022   Started 08/2022 A/w diabetes and revlimid usage Burning on top of left foot   Past Surgical History:  Procedure Laterality Date   APPENDECTOMY     [place] FLUORO GUIDE CV LINE RIGHT  05/15/2022   [place] PATIENT EVAL TECH 0-60 MINS  05/19/2022   [place] US GUIDE VASC ACCESS RIGHT  05/15/2022      Allergies: Patient has no known allergies.  Medications: Prior to Admission medications   Medication Sig Start Date End Date Taking? Authorizing Provider  Accu-Chek Softclix Lancets lancets Use to test blood sugars up to 4 times daily as directed. 08/19/22   Loralee Pacas, MD  acyclovir (ZOVIRAX) 400 MG tablet Take 1 tablet (400 mg total) by mouth 2 (two) times daily. 11/07/22   Brunetta Genera, MD  aspirin EC 81 MG tablet Take 1 tablet (81 mg total) by mouth daily. Swallow whole. 10/10/22   Brunetta Genera, MD  Blood Glucose Monitoring Suppl (ACCU-CHEK GUIDE) w/Device KIT Use to test blood sugars up to 4 times daily as directed. 05/20/22   Antonieta Pert, MD  Continuous Blood Gluc Sensor (FREESTYLE LIBRE 3 SENSOR) MISC 1 Application by Does not apply route every 14 (fourteen) days. Place 1 sensor on the skin every 14 days. Use to check glucose continuously 08/07/22   Loralee Pacas, MD  Cyanocobalamin (B-12) 1000 MCG CAPS Take 1 tablet by mouth daily. 10/10/22   Brunetta Genera, MD  gabapentin (NEURONTIN) 300 MG capsule  Take 1 capsule (300 mg total) by mouth 3 (three) times daily. 11/07/22   Brunetta Genera, MD  glucose blood (ACCU-CHEK GUIDE) test strip Use to test blood sugars up to 4 times daily as directed. 08/19/22   Loralee Pacas, MD  metFORMIN (GLUCOPHAGE) 500 MG tablet Take 1 tablet (500 mg total) by mouth 2 (two) times daily with a meal. To start take just half tablet twice daily before meals for 3 days 08/19/22   Loralee Pacas, MD  ondansetron (ZOFRAN) 8 MG tablet Take 1 tablet (8 mg total) by mouth every 8 (eight) hours as needed for nausea or vomiting. 10/10/22   Brunetta Genera, MD  prochlorperazine (COMPAZINE) 10 MG tablet Take 1 tablet (10 mg total) by mouth every 6 (six) hours as needed for nausea or vomiting. 10/10/22   Brunetta Genera, MD  REVLIMID 25 MG capsule TAKE 1 CAPSULE BY MOUTH 1 TIME A DAY FOR 21 DAYS ON THEN 7 DAYS OFF 01/30/23   Brunetta Genera, MD     Vital Signs:   Code Status:    Physical Exam  Imaging: No results found.  Labs:  CBC: Recent Labs    12/19/22 1155 01/02/23 1250 01/16/23 1222 01/30/23 1138  WBC 8.0 6.7 8.7 10.2  HGB 13.8 13.9 14.4 14.0  HCT 41.2 41.1 43.0 41.0  PLT 191 197 260 191    COAGS:  No results for input(s): "INR", "APTT" in the last 8760 hours.  BMP: Recent Labs    12/19/22 1155 01/02/23 1250 01/16/23 1222 01/30/23 1138  NA 139 138 137 136  K 3.6 3.4* 3.3* 3.3*  CL 105 104 101 101  CO2 29 28 25 27  $ GLUCOSE 153* 202* 246* 267*  BUN 15 11 17 17  $ CALCIUM 9.1 8.9 9.3 8.9  CREATININE 0.82 0.69 0.74 0.76  GFRNONAA >60 >60 >60 >60    LIVER FUNCTION TESTS: Recent Labs    12/19/22 1155 01/02/23 1250 01/16/23 1222 01/30/23 1138  BILITOT 0.5 0.6 0.9 0.4  AST 14* 14* 16 12*  ALT 23 25 22 21  $ ALKPHOS 50 50 42 48  PROT 6.4* 6.0* 6.4* 6.2*  ALBUMIN 4.1 4.1 4.1 3.9    Assessment and Plan: Pt known to [facility] team from temp HD cath placement on 05/15/22. He has a hx of high risk multiple myeloma and presents  today for image guided bone marrow biopsy to assess response to treatment.Risks and benefits of procedure was discussed with the patient  including, but not limited to bleeding, infection, damage to adjacent structures or low yield requiring additional tests.  All of the questions were answered and there is agreement to proceed.  Consent signed and in chart.    Electronically Signed: D. Rowe Robert, PA-C 02/10/2023, 4:01 PM   I spent a total of 20 minutes at the the patient's bedside AND on the patient's hospital floor or unit, greater than 50% of which was counseling/coordinating care for image guided bone marrow biopsy

## 2023-02-11 ENCOUNTER — Ambulatory Visit (HOSPITAL_COMMUNITY)
Admission: RE | Admit: 2023-02-11 | Discharge: 2023-02-11 | Disposition: A | Discharge status: Home / Self Care | Payer: BC Managed Care – PPO | Source: Ambulatory Visit | Attending: Hematology | Admitting: Hematology

## 2023-02-11 ENCOUNTER — Encounter (HOSPITAL_COMMUNITY): Payer: Self-pay

## 2023-02-11 ENCOUNTER — Other Ambulatory Visit: Payer: Self-pay

## 2023-02-11 DIAGNOSIS — C9000 Multiple myeloma not having achieved remission: Secondary | ICD-10-CM | POA: Diagnosis not present

## 2023-02-11 HISTORY — PX: IR BONE MARROW BIOPSY & ASPIRATION: IMG5727

## 2023-02-11 LAB — CBC WITH DIFFERENTIAL/PLATELET
Abs Immature Granulocytes: 0.02 10*3/uL (ref 0.00–0.07)
Basophils Absolute: 0.1 10*3/uL (ref 0.0–0.1)
Basophils Relative: 2 %
Eosinophils Absolute: 0.2 10*3/uL (ref 0.0–0.5)
Eosinophils Relative: 4 %
HCT: 44.3 % (ref 39.0–52.0)
Hemoglobin: 14.4 g/dL (ref 13.0–17.0)
Immature Granulocytes: 0 %
Lymphocytes Relative: 38 %
Lymphs Abs: 2.3 10*3/uL (ref 0.7–4.0)
MCH: 26.6 pg (ref 26.0–34.0)
MCHC: 32.5 g/dL (ref 30.0–36.0)
MCV: 81.9 fL (ref 80.0–100.0)
Monocytes Absolute: 0.7 10*3/uL (ref 0.1–1.0)
Monocytes Relative: 11 %
Neutro Abs: 2.8 10*3/uL (ref 1.7–7.7)
Neutrophils Relative %: 45 %
Platelets: 203 10*3/uL (ref 150–400)
RBC: 5.41 MIL/uL (ref 4.22–5.81)
RDW: 14.4 % (ref 11.5–15.5)
WBC: 6.2 10*3/uL (ref 4.0–10.5)
nRBC: 0 % (ref 0.0–0.2)

## 2023-02-11 LAB — GLUCOSE, CAPILLARY: Glucose-Capillary: 180 mg/dL — ABNORMAL HIGH (ref 70–99)

## 2023-02-11 MED ORDER — SODIUM CHLORIDE 0.9 % IV SOLN: INTRAVENOUS | Status: DC

## 2023-02-11 MED ORDER — LIDOCAINE HCL (PF) 1 % IJ SOLN
INTRAMUSCULAR | Status: AC | PRN
  Administered 2023-02-11: 15 mL

## 2023-02-11 MED ORDER — MIDAZOLAM HCL 2 MG/2ML IJ SOLN
INTRAMUSCULAR | Status: AC
  Filled 2023-02-11: qty 2

## 2023-02-11 MED ORDER — MIDAZOLAM HCL 2 MG/2ML IJ SOLN
INTRAMUSCULAR | Status: AC | PRN
  Administered 2023-02-11 (×2): 1 mg via INTRAVENOUS

## 2023-02-11 MED ORDER — FENTANYL CITRATE (PF) 100 MCG/2ML IJ SOLN
INTRAMUSCULAR | Status: AC | PRN
  Administered 2023-02-11 (×2): 50 ug via INTRAVENOUS

## 2023-02-11 MED ORDER — LIDOCAINE HCL (PF) 1 % IJ SOLN
INTRAMUSCULAR | Status: AC
  Administered 2023-02-11: 10 mL
  Filled 2023-02-11: qty 30

## 2023-02-11 MED ORDER — FENTANYL CITRATE (PF) 100 MCG/2ML IJ SOLN
INTRAMUSCULAR | Status: AC
  Filled 2023-02-11: qty 2

## 2023-02-11 NOTE — Discharge Instructions (Signed)
Discharge Instructions:   Please call Interventional Radiology clinic [phone] with any questions or concerns.  You may remove your dressing and shower tomorrow.   Moderate Conscious Sedation, Adult, Care After This sheet gives you information about how to care for yourself after your procedure. Your health care provider may also give you more specific instructions. If you have problems or questions, contact your health care provider. What can I expect after the procedure? After the procedure, it is common to have: Sleepiness for several hours. Impaired judgment for several hours. Difficulty with balance. Vomiting if you eat too soon. Follow these instructions at home: For the time period you were told by your health care provider: Rest. Do not participate in activities where you could fall or become injured. Do not drive or use machinery. Do not drink alcohol. Do not take sleeping pills or medicines that cause drowsiness. Do not make important decisions or sign legal documents. Do not take care of children on your own. Eating and drinking  Follow the diet recommended by your health care provider. Drink enough fluid to keep your urine pale yellow. If you vomit: Drink water, juice, or soup when you can drink without vomiting. Make sure you have little or no nausea before eating solid foods. General instructions Take over-the-counter and prescription medicines only as told by your health care provider. Have a responsible adult stay with you for the time you are told. It is important to have someone help care for you until you are awake and alert. Do not smoke. Keep all follow-up visits as told by your health care provider. This is important. Contact a health care provider if: You are still sleepy or having trouble with balance after 24 hours. You feel light-headed. You keep feeling nauseous or you keep vomiting. You develop a rash. You have a fever. You have redness or  swelling around the IV site. Get help right away if: You have trouble breathing. You have new-onset confusion at home. Summary After the procedure, it is common to feel sleepy, have impaired judgment, or feel nauseous if you eat too soon. Rest after you get home. Know the things you should not do after the procedure. Follow the diet recommended by your health care provider and drink enough fluid to keep your urine pale yellow. Get help right away if you have trouble breathing or new-onset confusion at home. This information is not intended to replace advice given to you by your health care provider. Make sure you discuss any questions you have with your health care provider. Document Revised: 04/13/2020 Document Reviewed: 11/10/2019 Elsevier Patient Education  2023 Elsevier Inc.   Bone Marrow Aspiration and Bone Marrow Biopsy, Adult, Care After This sheet gives you information about how to care for yourself after your procedure. Your health care provider may also give you more specific instructions. If you have problems or questions, contact your health care provider. What can I expect after the procedure? After the procedure, it is common to have: Mild pain and tenderness. Swelling. Bruising. Follow these instructions at home: Puncture site care  Follow instructions from your health care provider about how to take care of the puncture site. Make sure you: Wash your hands with soap and water before and after you change your bandage (dressing). If soap and water are not available, use hand sanitizer. Change your dressing as told by your health care provider. Check your puncture site every day for signs of infection. Check for: More redness, swelling, or pain.   Fluid or blood. Warmth. Pus or a bad smell. Activity Return to your normal activities as told by your health care provider. Ask your health care provider what activities are safe for you. Do not lift anything that is heavier  than 10 lb (4.5 kg), or the limit that you are told, until your health care provider says that it is safe. Do not drive for 24 hours if you were given a sedative during your procedure. General instructions  Take over-the-counter and prescription medicines only as told by your health care provider. Do not take baths, swim, or use a hot tub until your health care provider approves. Ask your health care provider if you may take showers. You may only be allowed to take sponge baths. If directed, put ice on the affected area. To do this: Put ice in a plastic bag. Place a towel between your skin and the bag. Leave the ice on for 20 minutes, 2-3 times a day. Keep all follow-up visits as told by your health care provider. This is important. Contact a health care provider if: Your pain is not controlled with medicine. You have a fever. You have more redness, swelling, or pain around the puncture site. You have fluid or blood coming from the puncture site. Your puncture site feels warm to the touch. You have pus or a bad smell coming from the puncture site. Summary After the procedure, it is common to have mild pain, tenderness, swelling, and bruising. Follow instructions from your health care provider about how to take care of the puncture site and what activities are safe for you. Take over-the-counter and prescription medicines only as told by your health care provider. Contact a health care provider if you have any signs of infection, such as fluid or blood coming from the puncture site. This information is not intended to replace advice given to you by your health care provider. Make sure you discuss any questions you have with your health care provider. Document Revised: 05/03/2019 Document Reviewed: 05/03/2019 Elsevier Patient Education  2023 Elsevier Inc.  

## 2023-02-11 NOTE — Procedures (Signed)
Pre-procedure Diagnosis: Concern for multiple myeloma. Post-procedure Diagnosis: Same  Technically successful CT guided bone marrow aspiration and biopsy of left iliac crest.   Complications: None Immediate EBL: Trace  Signed: Sandi Mariscal Pager: [phone] 02/11/2023, 11:17 AM

## 2023-02-13 ENCOUNTER — Other Ambulatory Visit: Payer: Self-pay

## 2023-02-13 ENCOUNTER — Inpatient Hospital Stay: Payer: BC Managed Care – PPO

## 2023-02-13 VITALS — BP 134/80 | HR 65 | Temp 98.1°F | Resp 18 | Wt 171.5 lb

## 2023-02-13 DIAGNOSIS — C9000 Multiple myeloma not having achieved remission: Secondary | ICD-10-CM

## 2023-02-13 DIAGNOSIS — Z7189 Other specified counseling: Secondary | ICD-10-CM

## 2023-02-13 LAB — CBC WITH DIFFERENTIAL (CANCER CENTER ONLY)
Abs Immature Granulocytes: 0.01 10*3/uL (ref 0.00–0.07)
Basophils Absolute: 0.1 10*3/uL (ref 0.0–0.1)
Basophils Relative: 1 %
Eosinophils Absolute: 0.2 10*3/uL (ref 0.0–0.5)
Eosinophils Relative: 2 %
HCT: 43 % (ref 39.0–52.0)
Hemoglobin: 14.6 g/dL (ref 13.0–17.0)
Immature Granulocytes: 0 %
Lymphocytes Relative: 45 %
Lymphs Abs: 3.4 10*3/uL (ref 0.7–4.0)
MCH: 27.1 pg (ref 26.0–34.0)
MCHC: 34 g/dL (ref 30.0–36.0)
MCV: 79.8 fL — ABNORMAL LOW (ref 80.0–100.0)
Monocytes Absolute: 0.7 10*3/uL (ref 0.1–1.0)
Monocytes Relative: 9 %
Neutro Abs: 3.3 10*3/uL (ref 1.7–7.7)
Neutrophils Relative %: 43 %
Platelet Count: 198 10*3/uL (ref 150–400)
RBC: 5.39 MIL/uL (ref 4.22–5.81)
RDW: 14.2 % (ref 11.5–15.5)
WBC Count: 7.7 10*3/uL (ref 4.0–10.5)
nRBC: 0 % (ref 0.0–0.2)

## 2023-02-13 LAB — COMPREHENSIVE METABOLIC PANEL
ALT: 18 U/L (ref 0–44)
AST: 14 U/L — ABNORMAL LOW (ref 15–41)
Albumin: 4.2 g/dL (ref 3.5–5.0)
Alkaline Phosphatase: 46 U/L (ref 38–126)
Anion gap: 7 (ref 5–15)
BUN: 10 mg/dL (ref 6–20)
CO2: 27 mmol/L (ref 22–32)
Calcium: 8.9 mg/dL (ref 8.9–10.3)
Chloride: 106 mmol/L (ref 98–111)
Creatinine, Ser: 0.63 mg/dL (ref 0.61–1.24)
GFR, Estimated: >60 mL/min (ref >60)
Glucose, Bld: 135 mg/dL — ABNORMAL HIGH (ref 70–99)
Potassium: 3.4 mmol/L — ABNORMAL LOW (ref 3.5–5.1)
Sodium: 140 mmol/L (ref 135–145)
Total Bilirubin: 0.7 mg/dL (ref 0.3–1.2)
Total Protein: 6.6 g/dL (ref 6.5–8.1)

## 2023-02-13 LAB — SURGICAL PATHOLOGY

## 2023-02-13 MED ORDER — SODIUM CHLORIDE 0.9 % IV SOLN
12.0000 mg | Freq: Once | INTRAVENOUS | Status: AC
  Administered 2023-02-13: 12 mg via INTRAVENOUS
  Filled 2023-02-13: qty 1.2

## 2023-02-13 MED ORDER — SODIUM CHLORIDE 0.9 % IV SOLN
16.0000 mg/kg | Freq: Once | INTRAVENOUS | Status: AC
  Administered 2023-02-13: 1100 mg via INTRAVENOUS
  Filled 2023-02-13: qty 15

## 2023-02-13 MED ORDER — DIPHENHYDRAMINE HCL 25 MG PO CAPS
50.0000 mg | ORAL_CAPSULE | Freq: Once | ORAL | Status: AC
  Administered 2023-02-13: 50 mg via ORAL
  Filled 2023-02-13: qty 2

## 2023-02-13 MED ORDER — ZOLEDRONIC ACID 4 MG/100ML IV SOLN
4.0000 mg | Freq: Once | INTRAVENOUS | Status: AC
  Administered 2023-02-13: 4 mg via INTRAVENOUS
  Filled 2023-02-13: qty 100

## 2023-02-13 MED ORDER — MONTELUKAST SODIUM 10 MG PO TABS
10.0000 mg | ORAL_TABLET | Freq: Once | ORAL | Status: AC
  Administered 2023-02-13: 10 mg via ORAL
  Filled 2023-02-13: qty 1

## 2023-02-13 MED ORDER — ACETAMINOPHEN 325 MG PO TABS
650.0000 mg | ORAL_TABLET | Freq: Once | ORAL | Status: AC
  Administered 2023-02-13: 650 mg via ORAL
  Filled 2023-02-13: qty 2

## 2023-02-13 MED ORDER — FAMOTIDINE IN NACL 20-0.9 MG/50ML-% IV SOLN
20.0000 mg | Freq: Once | INTRAVENOUS | Status: AC
  Administered 2023-02-13: 20 mg via INTRAVENOUS
  Filled 2023-02-13: qty 50

## 2023-02-13 MED ORDER — SODIUM CHLORIDE 0.9 % IV SOLN: Freq: Once | INTRAVENOUS | Status: AC

## 2023-02-13 NOTE — Progress Notes (Signed)
Pt given Revlimid that was delivered to [facility]. Pt to start today and take daily for 21 days. 02/13/23 - 03/06/23. Pt to take off 03/07/23 - 03/13/23. Pt will restart Revlimid on 03/14/23. Pt verbalized understanding to start today and take for 21 days, 1 tablet daily.

## 2023-02-13 NOTE — Patient Instructions (Signed)
[facility]  Discharge Instructions: Thank you for choosing [facility] to provide your oncology and hematology care.   If you have a lab appointment with the [facility], please go directly to the [facility] and check in at the registration area.   Wear comfortable clothing and clothing appropriate for easy access to any Portacath or PICC line.   We strive to give you quality time with your provider. You may need to reschedule your appointment if you arrive late (15 or more minutes).  Arriving late affects you and other patients whose appointments are after yours.  Also, if you miss three or more appointments without notifying the office, you may be dismissed from the clinic at the provider's discretion.      For prescription refill requests, have your pharmacy contact our office and allow 72 hours for refills to be completed.    Today you received the following chemotherapy and/or immunotherapy agents: Darzalex  and Zometa   To help prevent nausea and vomiting after your treatment, we encourage you to take your nausea medication as directed.  BELOW ARE SYMPTOMS THAT SHOULD BE REPORTED IMMEDIATELY: *FEVER GREATER THAN 100.4 F (38 C) OR HIGHER *CHILLS OR SWEATING *NAUSEA AND VOMITING THAT IS NOT CONTROLLED WITH YOUR NAUSEA MEDICATION *UNUSUAL SHORTNESS OF BREATH *UNUSUAL BRUISING OR BLEEDING *URINARY PROBLEMS (pain or burning when urinating, or frequent urination) *BOWEL PROBLEMS (unusual diarrhea, constipation, pain near the anus) TENDERNESS IN MOUTH AND THROAT WITH OR WITHOUT PRESENCE OF ULCERS (sore throat, sores in mouth, or a toothache) UNUSUAL RASH, SWELLING OR PAIN  UNUSUAL VAGINAL DISCHARGE OR ITCHING   Items with * indicate a potential emergency and should be followed up as soon as possible or go to the Emergency Department if any problems should occur.  Please show the CHEMOTHERAPY ALERT CARD or IMMUNOTHERAPY ALERT CARD  at check-in to the Emergency Department and triage nurse.  Should you have questions after your visit or need to cancel or reschedule your appointment, please contact [facility]  Dept: [phone]  and follow the prompts.  Office hours are 8:00 a.m. to 4:30 p.m. Monday - Friday. Please note that voicemails left after 4:00 p.m. may not be returned until the following business day.  We are closed weekends and major holidays. You have access to a nurse at all times for urgent questions. Please call the main number to the clinic Dept: [phone] and follow the prompts.   For any non-urgent questions, you may also contact your provider using MyChart. We now offer e-Visits for anyone 36 and older to request care online for non-urgent symptoms. For details visit [email or web address].   Also download the MyChart app! Go to the app store, search "MyChart", open the app, select [facility], and log in with your MyChart username and password.  Masks are optional in the cancer centers. If you would like for your care team to wear a mask while they are taking care of you, please let them know. You may have one support person who is at least 56 years old accompany you for your appointments. Zoledronic Acid Injection (Cancer) What is this medication? ZOLEDRONIC ACID (ZOE le dron ik AS id) treats high calcium levels in the blood caused by cancer. It may also be used with chemotherapy to treat weakened bones caused by cancer. It works by slowing down the release of calcium from bones. This lowers calcium levels in your blood.  It also makes your bones stronger and less likely to break (fracture). It belongs to a group of medications called bisphosphonates. This medicine may be used for other purposes; ask your health care provider or pharmacist if you have questions. COMMON BRAND NAME(S): Zometa, Zometa Powder What should I tell my care team before I take this  medication? They need to know if you have any of these conditions: Dehydration Dental disease Kidney disease Liver disease Low levels of calcium in the blood Lung or breathing disease, such as asthma Receiving steroids, such as dexamethasone or prednisone An unusual or allergic reaction to zoledronic acid, other medications, foods, dyes, or preservatives Pregnant or trying to get pregnant Breast-feeding How should I use this medication? This medication is injected into a vein. It is given by your care team in a hospital or clinic setting. Talk to your care team about the use of this medication in children. Special care may be needed. Overdosage: If you think you have taken too much of this medicine contact a poison control center or emergency room at once. NOTE: This medicine is only for you. Do not share this medicine with others. What if I miss a dose? Keep appointments for follow-up doses. It is important not to miss your dose. Call your care team if you are unable to keep an appointment. What may interact with this medication? Certain antibiotics given by injection Diuretics, such as bumetanide, furosemide NSAIDs, medications for pain and inflammation, such as ibuprofen or naproxen Teriparatide Thalidomide This list may not describe all possible interactions. Give your health care provider a list of all the medicines, herbs, non-prescription drugs, or dietary supplements you use. Also tell them if you smoke, drink alcohol, or use illegal drugs. Some items may interact with your medicine. What should I watch for while using this medication? Visit your care team for regular checks on your progress. It may be some time before you see the benefit from this medication. Some people who take this medication have severe bone, joint, or muscle pain. This medication may also increase your risk for jaw problems or a broken thigh bone. Tell your care team right away if you have severe pain in  your jaw, bones, joints, or muscles. Tell you care team if you have any pain that does not go away or that gets worse. Tell your dentist and dental surgeon that you are taking this medication. You should not have major dental surgery while on this medication. See your dentist to have a dental exam and fix any dental problems before starting this medication. Take good care of your teeth while on this medication. Make sure you see your dentist for regular follow-up appointments. You should make sure you get enough calcium and vitamin D while you are taking this medication. Discuss the foods you eat and the vitamins you take with your care team. Check with your care team if you have severe diarrhea, nausea, and vomiting, or if you sweat a lot. The loss of too much body fluid may make it dangerous for you to take this medication. You may need bloodwork while taking this medication. Talk to your care team if you wish to become pregnant or think you might be pregnant. This medication can cause serious birth defects. What side effects may I notice from receiving this medication? Side effects that you should report to your care team as soon as possible: Allergic reactions--skin rash, itching, hives, swelling of the face, lips, tongue, or throat Kidney injury--decrease  in the amount of urine, swelling of the ankles, hands, or feet Low calcium level--muscle pain or cramps, confusion, tingling, or numbness in the hands or feet Osteonecrosis of the jaw--pain, swelling, or redness in the mouth, numbness of the jaw, poor healing after dental work, unusual discharge from the mouth, visible bones in the mouth Severe bone, joint, or muscle pain Side effects that usually do not require medical attention (report to your care team if they continue or are bothersome): Constipation Fatigue Fever Loss of appetite Nausea Stomach pain This list may not describe all possible side effects. Call your doctor for medical  advice about side effects. You may report side effects to FDA at 1-800-FDA-1088. Where should I keep my medication? This medication is given in a hospital or clinic. It will not be stored at home. NOTE: This sheet is a summary. It may not cover all possible information. If you have questions about this medicine, talk to your doctor, pharmacist, or health care provider.  2023 Elsevier/Gold Standard (2022-01-30 00:00:00)

## 2023-02-13 NOTE — Progress Notes (Signed)
Rounding has it slightly out of 10%, OK to give 1143m

## 2023-02-19 ENCOUNTER — Encounter (HOSPITAL_COMMUNITY): Payer: Self-pay | Admitting: Hematology

## 2023-02-19 LAB — MULTIPLE MYELOMA PANEL, SERUM
Albumin SerPl Elph-Mcnc: 3.6 g/dL (ref 2.9–4.4)
Albumin/Glob SerPl: 1.6 (ref 0.7–1.7)
Alpha 1: 0.1 g/dL (ref 0.0–0.4)
Alpha2 Glob SerPl Elph-Mcnc: 0.9 g/dL (ref 0.4–1.0)
B-Globulin SerPl Elph-Mcnc: 0.8 g/dL (ref 0.7–1.3)
Gamma Glob SerPl Elph-Mcnc: 0.6 g/dL (ref 0.4–1.8)
Globulin, Total: 2.4 g/dL (ref 2.2–3.9)
IgA: 102 mg/dL (ref 90–386)
IgG (Immunoglobin G), Serum: 661 mg/dL (ref 603–1613)
IgM (Immunoglobulin M), Srm: 27 mg/dL (ref 20–172)
M Protein SerPl Elph-Mcnc: 0.1 g/dL — ABNORMAL HIGH
Total Protein ELP: 6 g/dL (ref 6.0–8.5)

## 2023-02-20 ENCOUNTER — Ambulatory Visit (HOSPITAL_COMMUNITY)
Admission: RE | Admit: 2023-02-20 | Discharge: 2023-02-20 | Disposition: A | Discharge status: Home / Self Care | Payer: BC Managed Care – PPO | Source: Ambulatory Visit | Attending: Hematology | Admitting: Hematology

## 2023-02-20 DIAGNOSIS — C9000 Multiple myeloma not having achieved remission: Secondary | ICD-10-CM | POA: Insufficient documentation

## 2023-02-20 LAB — GLUCOSE, CAPILLARY: Glucose-Capillary: 200 mg/dL — ABNORMAL HIGH (ref 70–99)

## 2023-02-20 MED ORDER — FLUDEOXYGLUCOSE F - 18 (FDG) INJECTION
9.0000 | Freq: Once | INTRAVENOUS | Status: AC
  Administered 2023-02-20: 8.5 via INTRAVENOUS

## 2023-02-23 ENCOUNTER — Encounter (HOSPITAL_COMMUNITY): Payer: Self-pay | Admitting: Hematology

## 2023-02-26 ENCOUNTER — Other Ambulatory Visit: Payer: Self-pay

## 2023-02-26 NOTE — Progress Notes (Signed)
HEMATOLOGY/ONCOLOGY CLINIC NOTE  Date of Service: 02/27/23  Chief complaint - Follow-up for continued evaluation and management of high risk multiple myeloma  Current treatment-daratumumab/Revlimid/dexamethasone Has not f/u on and does not appear keen to consider Melphalan/HDT-AUTOHSCT  INTERVAL HISTORY:  Donald Suarez is a 57 y.o. male is here with his Donald Suarez interpreter for continued evaluation and management of his multiple myeloma. Patient was last seen by me on 01/30/23 and was doing well overall with no new medical concerns.  Today, an interpreter is on call. Patient denies any new concerns since his last visit. He denies any concerns, infection issues, back pain, headaches, abdominal pain, leg swelling or dizziness.  MEDICAL HISTORY:  Past Medical History:  Diagnosis Date   Anemia    Elevated ferritin 08/07/2022   Hypocalcemia 08/19/2022   In setting of multiple myeloma    Microalbuminuria 08/19/2022   Peripheral neuropathy 09/08/2022   Started 08/2022 A/w diabetes and revlimid usage Burning on top of left foot    SURGICAL HISTORY: Past Surgical History:  Procedure Laterality Date   APPENDECTOMY     Donald Suarez  02/11/2023   Donald Suarez  05/15/2022   Donald Suarez  05/19/2022   Donald Suarez  05/15/2022    SOCIAL HISTORY: Social History   Socioeconomic History   Marital status: Married    Spouse name: Not on file   Number of children: Not on file   Years of education: Not on file   Highest education level: Not on file  Occupational History   Not on file  Tobacco Use   Smoking status: Never   Smokeless tobacco: Never  Vaping Use   Vaping Use: Never used  Substance and Sexual Activity   Alcohol use: Never   Drug use: Never   Sexual activity: Not on file  Other Topics Concern   Not on file  Social History Narrative   Not on file   Social Determinants of Health   Financial  Resource Strain: Medium Risk (05/21/2022)   Overall Financial Resource Strain (CARDIA)    Difficulty of Paying Living Expenses: Somewhat hard  Food Insecurity: Food Insecurity Present (05/21/2022)   Hunger Vital Sign    Worried About Running Out of Food in the Last Year: Sometimes true    Ran Out of Food in the Last Year: Sometimes true  Transportation Needs: No Transportation Needs (05/21/2022)   PRAPARE - Hydrologist (Medical): No    Lack of Transportation (Non-Medical): No  Physical Activity: Not on file  Stress: Not on file  Social Connections: Not on file  Intimate Partner Violence: Not on file    FAMILY HISTORY: No family history on file.  ALLERGIES:  has No Known Allergies.  MEDICATIONS:  . Allergies as of 02/27/2023   No Known Allergies      Medication List        Accurate as of February 26, 2023  9:33 AM. If you have any questions, ask your nurse or doctor.          Accu-Chek Guide test strip Generic drug: glucose blood Use to test blood sugars up to 4 times daily as directed.   Accu-Chek Guide w/Device Kit Use to test blood sugars up to 4 times daily as directed.   Accu-Chek Softclix Lancets lancets Use to test blood sugars up to 4 times daily as directed.   acyclovir  400 MG tablet Commonly known as: ZOVIRAX Take 1 tablet (400 mg total) by mouth 2 (two) times daily.   aspirin EC 81 MG tablet Take 1 tablet (81 mg total) by mouth daily. Swallow whole.   B-12 1000 MCG Caps Take 1 tablet by mouth daily.   FreeStyle Libre 3 Sensor Misc 1 Application by Does not apply route every 14 (fourteen) days. Place 1 sensor on the skin every 14 days. Use to check glucose continuously   gabapentin 300 MG capsule Commonly known as: NEURONTIN Take 1 capsule (300 mg total) by mouth 3 (three) times daily.   metFORMIN 500 MG tablet Commonly known as: GLUCOPHAGE Take 1 tablet (500 mg total) by mouth 2 (two) times daily with a meal. To  start take just half tablet twice daily before meals for 3 days   ondansetron 8 MG tablet Commonly known as: Zofran Take 1 tablet (8 mg total) by mouth every 8 (eight) hours as needed for nausea or vomiting.   prochlorperazine 10 MG tablet Commonly known as: COMPAZINE Take 1 tablet (10 mg total) by mouth every 6 (six) hours as needed for nausea or vomiting.   Revlimid 25 MG capsule Generic drug: lenalidomide TAKE 1 CAPSULE BY MOUTH 1 TIME A DAY FOR 21 DAYS ON THEN 7 DAYS OFF         REVIEW OF SYSTEMS:   .10 Point review of Systems was done is negative except as noted above.  PHYSICAL EXAMINATION: .BP 130/82 (BP Location: Left Arm, Patient Position: Sitting)   Pulse 73   Temp 97.6 F (36.4 C) (Temporal)   Resp 18   Wt 174 lb 4 oz (79 kg)   SpO2 100%   BMI 28.12 kg/m   GENERAL:alert, in no acute distress and comfortable SKIN: no acute rashes, no significant lesions EYES: conjunctiva are pink and non-injected, sclera anicteric OROPHARYNX: MMM, no exudates, no oropharyngeal erythema or ulceration NECK: supple, no JVD LYMPH:  no palpable lymphadenopathy in the cervical, axillary or inguinal regions LUNGS: clear to auscultation b/l with normal respiratory effort HEART: regular rate & rhythm ABDOMEN:  normoactive bowel sounds , non tender, not distended. Extremity: no pedal edema PSYCH: alert & oriented x 3 with fluent speech NEURO: no focal motor/sensory deficits    LABORATORY DATA:  I have reviewed the data as listed  .    Latest Ref Rng & Units 02/27/2023    9:43 AM 02/13/2023   11:52 AM 02/11/2023   10:20 AM  CBC  WBC 4.0 - 10.5 K/uL 10.5  7.7  6.2   Hemoglobin 13.0 - 17.0 g/dL 14.0  14.6  14.4   Hematocrit 39.0 - 52.0 % 41.6  43.0  44.3   Platelets 150 - 400 K/uL 188  198  203     .    Latest Ref Rng & Units 02/27/2023    9:43 AM 02/13/2023   11:52 AM 01/30/2023   11:38 AM  CMP  Glucose 70 - 99 mg/dL 185  135  267   BUN 6 - 20 mg/dL '13  10  17   '$ Creatinine  0.61 - 1.24 mg/dL 0.72  0.63  0.76   Sodium 135 - 145 mmol/L 139  140  136   Potassium 3.5 - 5.1 mmol/L 3.2  3.4  3.3   Chloride 98 - 111 mmol/L 104  106  101   CO2 22 - 32 mmol/L '26  27  27   '$ Calcium 8.9 - 10.3 mg/dL 8.8  8.9  8.9   Total Protein 6.5 - 8.1 g/dL 6.8  6.6  6.2   Total Bilirubin 0.3 - 1.2 mg/dL 0.8  0.7  0.4   Alkaline Phos 38 - 126 U/L 41  46  48   AST 15 - 41 U/L '26  14  12   '$ ALT 0 - 44 U/L '22  18  21    '$ . Lab Results  Component Value Date   LDH 114 10/10/2022    Bone marrow biopsy 02/11/2023   RADIOGRAPHIC STUDIES: I have personally reviewed the radiological images as listed and agreed with the findings in the report. NM PET Image Restage (PS) Whole Suarez  Result Date: 02/23/2023 CLINICAL DATA:  Subsequent treatment strategy for multiple myeloma. EXAM: NUCLEAR MEDICINE PET WHOLE Suarez TECHNIQUE: 8.5 mCi F-18 FDG was injected intravenously. Full-ring PET imaging was performed from the head to foot after the radiotracer. CT data was obtained and used for attenuation correction and anatomic localization. Fasting blood glucose: 200 mg/dl COMPARISON:  05/28/2022. FINDINGS: Mediastinal blood pool activity: SUV max 2.3 HEAD/NECK: No abnormal hypermetabolism. Incidental CT findings: None. CHEST: No abnormal hypermetabolism. Incidental CT findings: Heart is enlarged.  No pericardial or pleural effusion. ABDOMEN/PELVIS: No abnormal hypermetabolism. Incidental CT findings: Liver is unremarkable. Stones in the gallbladder. Adrenal glands, kidneys, spleen, pancreas, stomach and bowel are grossly unremarkable. Atherosclerotic calcification of the aorta. SKELETON: Persistent focal uptake in the anterior left second rib corresponds to an old, possibly pathologic, fracture, SUV max 2.5, stable. No new areas of abnormal hypermetabolism. Otherwise, mild patchy uptake in the spine without focality. No new hypermetabolic lesions. Incidental CT findings: Extensive lytic disease throughout the  visualized osseous structures, as before. EXTREMITIES: No focal abnormal hypermetabolism. Incidental CT findings: None. IMPRESSION: 1. Persistent mild uptake associated with the anterior left second rib, possibly due to an old pathologic fracture. Mild patchy metabolism throughout the spine is likely treatment related. Extensive lytic disease throughout the visualized osseous structures without new focal areas of abnormal hypermetabolism to suggest metabolically active multiple myeloma. 2. Cholelithiasis. 3.  Aortic atherosclerosis (ICD10-I70.0). Electronically Signed   By: Lorin Picket M.D.   On: 02/23/2023 08:18   Jery BONE MARROW BIOPSY & Suarez  Result Date: 02/11/2023 INDICATION: Concern for multiple myeloma. Please perform image guided bone marrow biopsy for tissue diagnostic purposes. EXAM: FLUOROSCOPIC GUIDED BONE MARROW BIOPSY AND Suarez MEDICATIONS: None ANESTHESIA/SEDATION: Moderate (conscious) sedation was employed during this procedure as administered by the Interventional Radiology RN. A total of Versed 2 mg and Fentanyl 100 mcg was administered intravenously. Moderate Sedation Time: 10 minutes. The patient's level of consciousness and vital signs were monitored continuously by radiology nursing throughout the procedure under my direct supervision. FLUOROSCOPY TIME: FLUOROSCOPY TIME 9 mGy COMPLICATIONS: None immediate. PROCEDURE: Informed consent was obtained from the patient (via the use of a medical translator) following an explanation of the procedure, risks, benefits and alternatives. The patient understands, agrees and consents for the procedure. All questions were addressed. A time out was performed prior to the initiation of the procedure. The patient was positioned prone on the fluoroscopy table and the posterior aspect of the left iliac crest was marked fluoroscopically. The operative site was prepped and draped in the usual sterile fashion. Under sterile conditions and local  anesthesia, an 11 gauge coaxial bone biopsy needle was advanced into the posterior aspect of the left iliac marrow space under intermittent fluoroscopic guidance. Multiple fluoroscopic images were saved procedural documentation purposes. Initially, a bone marrow Suarez was performed.  Next, a bone marrow biopsy was obtained with the 11 gauge outer bone marrow device. The 11 gauge coaxial bone biopsy needle was re-advanced into a slightly different location within the left iliac marrow space, positioning was confirmed with fluoroscopic imaging and an additional bone marrow biopsy was obtained. Samples were prepared with the cytotechnologist and deemed adequate. The needle was removed and superficial hemostasis was obtained with manual compression. A dressing was applied. The patient tolerated the procedure well without immediate post procedural complication. IMPRESSION: Successful fluoroscopic guided left iliac bone marrow Suarez and core biopsy. Electronically Signed   By: Sandi Mariscal M.D.   On: 02/11/2023 15:04    ASSESSMENT & PLAN:   1) R-ISS stage III high risk IgG Lambda Multiple myeloma not on treatment for more than 1 year due to patient's lack of follow-up.  Patient diagnosed December 2021 and received 1 cycle of CyBorD. Had previously presented with anemia renal insufficiency and hyperviscosity symptoms. Bx- 90% plasma cells Initial M spike 8.03g/dl Del(1p): Not Detected  Dup(1q): Not Detected  Gains(15): DETECTED  Gains(5 and 9): Not Detected  Del(13q)/-13: DETECTED  Del(17p)(TP53): Not Detected  IGH(Rearrangement): SEE BELOW   IgH complex: t(4;14): DETECTED  t(11;14): Not Detected  t(14;16): Not Detected  t(14;20): Not Detected   Cytogenetics: Normal male karyotype.   -Labs from 05/13/2022-beta-2 microglobulin 4.4, LDH 197, lambda free light chain 77.1, kappa, lambda light chain ratio 0.08, multiple myeloma panel pending.  UPEP ordered but not yet collected. 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  Due to multiple myeloma   PLAN  -Discussed lab results on 02/27/23 with patient. CBC revealed WBC of 10.5K, hemoglobin of 14, and platelets of 188K -PET scan does not show any signs of active myeloma, treatment has worked well. Previous bone tumors no longer light up, meaning bone tumors are no longer active at this time -scan only revealed healing fractured rib on patient's left side -bone marrow biopsy revealed 1% plasma cells, normal Did not show any signs of active cancer remaining.  -no definitive evidence of plasma cell neoplasm remaining at this time -molecular pathology did not detect any previous mutation -continue current dose of Revlimid '25mg'$  po 3 week on and 1 week off. -Monthly on Daratumumab -Patient is in remission at this time, discussed risk it may return and may need to adjust current medication regimen -switch to once a month IV infusions instead of every 2 weeks -Continue current dose of Revlimid for next two months then consider dropping dose after that time -Continue Zometa injections for at least one more year.  -Discussed option of bone marrow transplant is still available. Patient does not want to receive a bone marrow transplant at this time. -Continue current dose of Aspirin and Acyclovir -Patient has received influenza and COVID-19 booster vaccinations -recommended pt to receive shingles vaccination  Follow-up Per integrated scheduling  The total  time spent in the appointment was 40 minutes* .  All of the patient's questions were answered with apparent satisfaction. The patient knows to call the clinic with any problems, questions or concerns.  Sullivan Lone MD MS AAHIVMS [facility] Hematology/Oncology Physician [facility]  .*Total Encounter Time as defined by the Centers for Medicare and Medicaid Services includes, in addition to the face-to-face time of a patient visit (documented in the note above) non-face-to-face time: obtaining and reviewing outside history, ordering and reviewing medications, tests or procedures, care coordination (communications with other health care professionals or caregivers) and documentation in the medical record.   I,Mitra Faeizi,acting as a Education administrator for Sullivan Lone, MD.,have documented all relevant documentation on the behalf of Sullivan Lone, MD,as directed by  Sullivan Lone, MD while in the presence of Sullivan Lone, MD.  .I have reviewed the above documentation for accuracy and completeness, and I agree with the above. Brunetta Genera MD

## 2023-02-27 ENCOUNTER — Other Ambulatory Visit: Payer: Self-pay

## 2023-02-27 ENCOUNTER — Inpatient Hospital Stay: Payer: BC Managed Care – PPO

## 2023-02-27 ENCOUNTER — Inpatient Hospital Stay: Payer: BC Managed Care – PPO | Admitting: Hematology

## 2023-02-27 ENCOUNTER — Inpatient Hospital Stay: Payer: BC Managed Care – PPO | Attending: Hematology

## 2023-02-27 VITALS — BP 126/78 | HR 59 | Resp 18

## 2023-02-27 DIAGNOSIS — K8020 Calculus of gallbladder without cholecystitis without obstruction: Secondary | ICD-10-CM | POA: Insufficient documentation

## 2023-02-27 DIAGNOSIS — C9000 Multiple myeloma not having achieved remission: Secondary | ICD-10-CM

## 2023-02-27 DIAGNOSIS — Z7189 Other specified counseling: Secondary | ICD-10-CM

## 2023-02-27 DIAGNOSIS — N289 Disorder of kidney and ureter, unspecified: Secondary | ICD-10-CM | POA: Insufficient documentation

## 2023-02-27 DIAGNOSIS — D649 Anemia, unspecified: Secondary | ICD-10-CM | POA: Diagnosis not present

## 2023-02-27 DIAGNOSIS — I700 Atherosclerosis of aorta: Secondary | ICD-10-CM | POA: Insufficient documentation

## 2023-02-27 DIAGNOSIS — Z5112 Encounter for antineoplastic immunotherapy: Secondary | ICD-10-CM | POA: Diagnosis not present

## 2023-02-27 DIAGNOSIS — D6959 Other secondary thrombocytopenia: Secondary | ICD-10-CM | POA: Diagnosis not present

## 2023-02-27 DIAGNOSIS — Z79899 Other long term (current) drug therapy: Secondary | ICD-10-CM | POA: Insufficient documentation

## 2023-02-27 DIAGNOSIS — E119 Type 2 diabetes mellitus without complications: Secondary | ICD-10-CM | POA: Insufficient documentation

## 2023-02-27 LAB — CBC WITH DIFFERENTIAL (CANCER CENTER ONLY)
Abs Immature Granulocytes: 0.03 10*3/uL (ref 0.00–0.07)
Basophils Absolute: 0.1 10*3/uL (ref 0.0–0.1)
Basophils Relative: 1 %
Eosinophils Absolute: 0.3 10*3/uL (ref 0.0–0.5)
Eosinophils Relative: 3 %
HCT: 41.6 % (ref 39.0–52.0)
Hemoglobin: 14 g/dL (ref 13.0–17.0)
Immature Granulocytes: 0 %
Lymphocytes Relative: 30 %
Lymphs Abs: 3.2 10*3/uL (ref 0.7–4.0)
MCH: 26.6 pg (ref 26.0–34.0)
MCHC: 33.7 g/dL (ref 30.0–36.0)
MCV: 79.1 fL — ABNORMAL LOW (ref 80.0–100.0)
Monocytes Absolute: 0.9 10*3/uL (ref 0.1–1.0)
Monocytes Relative: 8 %
Neutro Abs: 6 10*3/uL (ref 1.7–7.7)
Neutrophils Relative %: 58 %
Platelet Count: 188 10*3/uL (ref 150–400)
RBC: 5.26 MIL/uL (ref 4.22–5.81)
RDW: 14 % (ref 11.5–15.5)
WBC Count: 10.5 10*3/uL (ref 4.0–10.5)
nRBC: 0 % (ref 0.0–0.2)

## 2023-02-27 LAB — COMPREHENSIVE METABOLIC PANEL
ALT: 22 U/L (ref 0–44)
AST: 26 U/L (ref 15–41)
Albumin: 4.1 g/dL (ref 3.5–5.0)
Alkaline Phosphatase: 41 U/L (ref 38–126)
Anion gap: 9 (ref 5–15)
BUN: 13 mg/dL (ref 6–20)
CO2: 26 mmol/L (ref 22–32)
Calcium: 8.8 mg/dL — ABNORMAL LOW (ref 8.9–10.3)
Chloride: 104 mmol/L (ref 98–111)
Creatinine, Ser: 0.72 mg/dL (ref 0.61–1.24)
GFR, Estimated: >60 mL/min (ref >60)
Glucose, Bld: 185 mg/dL — ABNORMAL HIGH (ref 70–99)
Potassium: 3.2 mmol/L — ABNORMAL LOW (ref 3.5–5.1)
Sodium: 139 mmol/L (ref 135–145)
Total Bilirubin: 0.8 mg/dL (ref 0.3–1.2)
Total Protein: 6.8 g/dL (ref 6.5–8.1)

## 2023-02-27 MED ORDER — SODIUM CHLORIDE 0.9 % IV SOLN: Freq: Once | INTRAVENOUS | Status: AC

## 2023-02-27 MED ORDER — SODIUM CHLORIDE 0.9 % IV SOLN
12.0000 mg | Freq: Once | INTRAVENOUS | Status: AC
  Administered 2023-02-27: 12 mg via INTRAVENOUS
  Filled 2023-02-27: qty 1.2

## 2023-02-27 MED ORDER — ACETAMINOPHEN 325 MG PO TABS
650.0000 mg | ORAL_TABLET | Freq: Once | ORAL | Status: AC
  Administered 2023-02-27: 650 mg via ORAL
  Filled 2023-02-27: qty 2

## 2023-02-27 MED ORDER — FAMOTIDINE IN NACL 20-0.9 MG/50ML-% IV SOLN
20.0000 mg | Freq: Once | INTRAVENOUS | Status: AC
  Administered 2023-02-27: 20 mg via INTRAVENOUS
  Filled 2023-02-27: qty 50

## 2023-02-27 MED ORDER — MONTELUKAST SODIUM 10 MG PO TABS
10.0000 mg | ORAL_TABLET | Freq: Once | ORAL | Status: AC
  Administered 2023-02-27: 10 mg via ORAL
  Filled 2023-02-27: qty 1

## 2023-02-27 MED ORDER — HEPARIN SOD (PORK) LOCK FLUSH 100 UNIT/ML IV SOLN: 500.0000 [IU] | Freq: Once | INTRAVENOUS | Status: DC | PRN

## 2023-02-27 MED ORDER — SODIUM CHLORIDE 0.9% FLUSH: 10.0000 mL | INTRAVENOUS | Status: DC | PRN

## 2023-02-27 MED ORDER — DIPHENHYDRAMINE HCL 25 MG PO CAPS
50.0000 mg | ORAL_CAPSULE | Freq: Once | ORAL | Status: AC
  Administered 2023-02-27: 50 mg via ORAL
  Filled 2023-02-27: qty 2

## 2023-02-27 MED ORDER — SODIUM CHLORIDE 0.9 % IV SOLN
16.0000 mg/kg | Freq: Once | INTRAVENOUS | Status: AC
  Administered 2023-02-27: 1100 mg via INTRAVENOUS
  Filled 2023-02-27: qty 15

## 2023-02-27 NOTE — Patient Instructions (Signed)
[facility]  Discharge Instructions: Thank you for choosing [facility] to provide your oncology and hematology care.   If you have a lab appointment with the [facility], please go directly to the [facility] and check in at the registration area.   Wear comfortable clothing and clothing appropriate for easy access to any Portacath or PICC line.   We strive to give you quality time with your provider. You may need to reschedule your appointment if you arrive late (15 or more minutes).  Arriving late affects you and other patients whose appointments are after yours.  Also, if you miss three or more appointments without notifying the office, you may be dismissed from the clinic at the provider's discretion.      For prescription refill requests, have your pharmacy contact our office and allow 72 hours for refills to be completed.    Today you received the following chemotherapy and/or immunotherapy agents: Daratumumab.       To help prevent nausea and vomiting after your treatment, we encourage you to take your nausea medication as directed.  BELOW ARE SYMPTOMS THAT SHOULD BE REPORTED IMMEDIATELY: *FEVER GREATER THAN 100.4 F (38 C) OR HIGHER *CHILLS OR SWEATING *NAUSEA AND VOMITING THAT IS NOT CONTROLLED WITH YOUR NAUSEA MEDICATION *UNUSUAL SHORTNESS OF BREATH *UNUSUAL BRUISING OR BLEEDING *URINARY PROBLEMS (pain or burning when urinating, or frequent urination) *BOWEL PROBLEMS (unusual diarrhea, constipation, pain near the anus) TENDERNESS IN MOUTH AND THROAT WITH OR WITHOUT PRESENCE OF ULCERS (sore throat, sores in mouth, or a toothache) UNUSUAL RASH, SWELLING OR PAIN  UNUSUAL VAGINAL DISCHARGE OR ITCHING   Items with * indicate a potential emergency and should be followed up as soon as possible or go to the Emergency Department if any problems should occur.  Please show the CHEMOTHERAPY ALERT CARD or IMMUNOTHERAPY ALERT CARD at  check-in to the Emergency Department and triage nurse.  Should you have questions after your visit or need to cancel or reschedule your appointment, please contact [facility]  Dept: [phone]  and follow the prompts.  Office hours are 8:00 a.m. to 4:30 p.m. Monday - Friday. Please note that voicemails left after 4:00 p.m. may not be returned until the following business day.  We are closed weekends and major holidays. You have access to a nurse at all times for urgent questions. Please call the main number to the clinic Dept: [phone] and follow the prompts.   For any non-urgent questions, you may also contact your provider using MyChart. We now offer e-Visits for anyone 71 and older to request care online for non-urgent symptoms. For details visit [email or web address].   Also download the MyChart app! Go to the app store, search "MyChart", open the app, select [facility], and log in with your MyChart username and password.

## 2023-02-27 NOTE — Progress Notes (Signed)
Patient seen by MD today  Vitals are within treatment parameters.  Labs reviewed: and are within treatment parameters. CMP pending.  Per physician team, patient is ready for treatment and there are NO modifications to the treatment plan.  

## 2023-03-02 ENCOUNTER — Other Ambulatory Visit: Payer: Self-pay | Admitting: Hematology

## 2023-03-02 DIAGNOSIS — C9000 Multiple myeloma not having achieved remission: Secondary | ICD-10-CM

## 2023-03-04 ENCOUNTER — Other Ambulatory Visit: Payer: Self-pay

## 2023-03-04 ENCOUNTER — Encounter: Payer: Self-pay | Admitting: Hematology

## 2023-03-05 ENCOUNTER — Encounter: Payer: Self-pay | Admitting: Internal Medicine

## 2023-03-05 ENCOUNTER — Encounter: Payer: Self-pay | Admitting: Hematology

## 2023-03-09 ENCOUNTER — Encounter: Payer: Self-pay | Admitting: Internal Medicine

## 2023-03-09 ENCOUNTER — Ambulatory Visit: Payer: BC Managed Care – PPO | Admitting: Internal Medicine

## 2023-03-09 VITALS — BP 128/88 | HR 60 | Temp 97.8°F | Ht 66.0 in | Wt 175.8 lb

## 2023-03-09 DIAGNOSIS — E876 Hypokalemia: Secondary | ICD-10-CM | POA: Diagnosis not present

## 2023-03-09 DIAGNOSIS — Z603 Acculturation difficulty: Secondary | ICD-10-CM | POA: Insufficient documentation

## 2023-03-09 DIAGNOSIS — E1165 Type 2 diabetes mellitus with hyperglycemia: Secondary | ICD-10-CM

## 2023-03-09 DIAGNOSIS — Z794 Long term (current) use of insulin: Secondary | ICD-10-CM

## 2023-03-09 DIAGNOSIS — Z1211 Encounter for screening for malignant neoplasm of colon: Secondary | ICD-10-CM

## 2023-03-09 DIAGNOSIS — Z789 Other specified health status: Secondary | ICD-10-CM | POA: Diagnosis not present

## 2023-03-09 HISTORY — DX: Hypokalemia: E87.6

## 2023-03-09 LAB — POCT GLYCOSYLATED HEMOGLOBIN (HGB A1C): Hemoglobin A1C: 8.2 % — AB (ref 4.0–5.6)

## 2023-03-09 MED ORDER — METFORMIN HCL 500 MG PO TABS: 1000.0000 mg | ORAL_TABLET | Freq: Two times a day (BID) | ORAL | 3 refills | Status: DC

## 2023-03-09 MED ORDER — POTASSIUM CHLORIDE CRYS ER 20 MEQ PO TBCR: 20.0000 meq | EXTENDED_RELEASE_TABLET | Freq: Every day | ORAL | 3 refills | Status: DC

## 2023-03-09 NOTE — Progress Notes (Signed)
[facility]: [id number]   Routine Medical Office Visit  Patient:  Donald Suarez      Age: 57 y.o.       Sex:  male  Date:   03/09/2023  PCP:    Loralee Pacas, [place] Provider: Loralee Pacas, MD   Assessment and Plan:   Donald Suarez was seen today for 6 month follow-up.  Type 2 diabetes mellitus with hyperglycemia, with long-term current use of insulin (HCC) Overview: Lab Results  Component Value Date   HGBA1C 6.7 (H) 08/12/2022   HGBA1C 8.2 (H) 05/13/2022   Lab Results  Component Value Date/Time   GLUCOSE 185 (H) 02/27/2023 09:43 AM   GLUCOSE 135 (H) 02/13/2023 11:52 AM   GLUCOSE 267 (H) 01/30/2023 11:38 AM   GLUCOSE 246 (H) 01/16/2023 12:22 PM   GLUCOSE 202 (H) 01/02/2023 12:50 PM   GLUCOSE 153 (H) 12/19/2022 11:55 AM   GLUCOSE 190 (H) 12/05/2022 11:13 AM   GLUCOSE 230 (H) 11/21/2022 08:38 AM   GLUCOSE 105 (H) 11/07/2022 11:13 AM   GLUCOSE 136 (H) 10/10/2022 01:04 PM    Metformin only Gabapentin for neuropathy Declining opht  Assessment & Plan: Reviewed home and lab sugars 160's Checked Hemoglobin A1c = 8.2 poorly controlled  Encouraged patient to bring sugar meter next time and check more frequently Was on gabapentin for neuropathy but unsure if taking Encouraged continuing with metformin but increase dose He declined referral for eye exam- due to working wants to reduce visits He feels he is not able to stop eating the rice that is the main food source. Encouraged replace with nonstarchy vegetables  Orders: -     POCT glycosylated hemoglobin (Hb A1C) -     metFORMIN HCl; Take 2 tablets (1,000 mg total) by mouth 2 (two) times daily with a meal. Note dose increased. Replaces 500 mg twice daily.  Dispense: 180 tablet; Refill: 3  Colon cancer screening -     Cologuard  Hypokalemia Overview: Lab Results  Component Value Date/Time   K 3.2 (L) 02/27/2023 09:43 AM   K 3.4 (L) 02/13/2023 11:52 AM   K 3.3 (L) 01/30/2023 11:38 AM    K 3.3 (L) 01/16/2023 12:22 PM   K 3.4 (L) 01/02/2023 12:50 PM   K 3.6 12/19/2022 11:55 AM   K 3.6 12/05/2022 11:13 AM     Orders: -     Potassium Chloride [place] ER; Take 1 tablet (20 mEq total) by mouth daily.  Dispense: 30 tablet; Refill: 3  Language barrier affecting health care Overview: He has translator sometimes which is helpful [place]: Spent 5 minutes demonstrating how to collect Cologuard with interpreter present so he understands, reordered so he will complete next time       Clinical Presentation:   The patient is a 57 y.o. 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   Loose, teeth 08/06/2022   Abfraction 08/06/2022   Attrition, teeth excessive 08/06/2022   Defective dental restoration 08/06/2022   Malocclusion 08/06/2022   Hyperproteinemia 12/20/2020   Blurry vision 12/20/2020  Hypomagnesemia 12/20/2020   Elevated ferritin 08/07/2022   Microalbuminuria 08/19/2022   Hypocalcemia 08/19/2022   Peripheral neuropathy 09/08/2022   Language barrier affecting health care 03/09/2023   Hypokalemia 03/09/2023   Resolved Ambulatory Problems    Diagnosis Date Noted   Symptomatic anemia 05/13/2022   Hyponatremia 05/13/2022   Hyperglycemia 05/13/2022   Thrombocytopenia ([place]) 05/13/2022   Encounter for preoperative dental examination 08/06/2022   Past Medical History:  Diagnosis Date   Anemia     Outpatient Medications Prior to Visit  Medication Sig   Accu-Chek Softclix Lancets lancets Use to test blood sugars up to 4 times daily  as directed.   acyclovir (ZOVIRAX) 400 MG tablet Take 1 tablet (400 mg total) by mouth 2 (two) times daily.   aspirin EC 81 MG tablet Take 1 tablet (81 mg total) by mouth daily. Swallow whole.   Blood Glucose Monitoring Suppl (ACCU-CHEK GUIDE) w/Device KIT Use to test blood sugars up to 4 times daily as directed.   Continuous Blood Gluc Sensor (FREESTYLE LIBRE 3 SENSOR) MISC 1 Application by Does not apply route every 14 (fourteen) days. Place 1 sensor on the skin every 14 days. Use to check glucose continuously   Cyanocobalamin (B-12) 1000 MCG CAPS Take 1 tablet by mouth daily.   gabapentin (NEURONTIN) 300 MG capsule Take 1 capsule (300 mg total) by mouth 3 (three) times daily.   glucose blood (ACCU-CHEK GUIDE) test strip Use to test blood sugars up to 4 times daily as directed.   ondansetron (ZOFRAN) 8 MG tablet Take 1 tablet (8 mg total) by mouth every 8 (eight) hours as needed for nausea or vomiting.   prochlorperazine (COMPAZINE) 10 MG tablet Take 1 tablet (10 mg total) by mouth every 6 (six) hours as needed for nausea or vomiting.   REVLIMID 25 MG capsule TAKE 1 CAPSULE BY MOUTH 1 TIME A DAY FOR 21 DAYS ON AND 7 DAYS OFF   [DISCONTINUED] metFORMIN (GLUCOPHAGE) 500 MG tablet Take 1 tablet (500 mg total) by mouth 2 (two) times daily with a meal. To start take just half tablet twice daily before meals for 3 days   Facility-Administered Medications Prior to Visit  Medication Dose Route Frequency Provider   clotrimazole (LOTRIMIN) 1 % cream   Topical BID Loralee Pacas, MD     Chief Complaint  Patient presents with   6 month follow-up    HPI  Feels he doing well Please see overview subsection of assessment/plan section for history updates.         Clinical Data Analysis:  Physical Exam  BP 128/88 (BP Location: Left Arm, Patient Position: Sitting)   Pulse 60   Temp 97.8 F (36.6 C) (Temporal)   Ht '5\' 6"'$  (1.676 m)   Wt 175 lb 12.8 oz (79.7 kg)   SpO2 99%   BMI 28.37 kg/m  Wt  Readings from Last 10 Encounters:  03/09/23 175 lb 12.8 oz (79.7 kg)  02/27/23 174 lb 4 oz (79 kg)  02/13/23 171 lb 8.3 oz (77.8 kg)  02/11/23 168 lb 6.9 oz (76.4 kg)  01/30/23 168 lb 8 oz (76.4 kg)  01/16/23 171 lb 1.9 oz (77.6 kg)  01/02/23 171 lb 12.8 oz (77.9 kg)  12/19/22 164 lb 12 oz (74.7 kg)  12/05/22 160 lb 9 oz (72.8 kg)  11/21/22 158 lb 8 oz (71.9 kg)   Vital signs reviewed.  Nursing notes reviewed. Weight trend reviewed. Abnormalities noted: slight weight gain. BMI  BMI is an  unreliable indicator of healthy body composition due to its inability to reflect lean muscle mass.  General Appearance:  Well developed, well nourished male in no acute distress.   Pulmonary:  Normal work of breathing at rest, no respiratory distress apparent. SpO2: 99 %  Musculoskeletal: All extremities are intact.  Neurological:  Awake, alert. No obvious focal neurological deficits or cognitive impairments.  Sensorium seems unclouded. Psychiatric:  Appropriate mood, pleasant demeanor Problem-specific findings:  seems to understand well with translator.   Results Reviewed: (reviewed labs/imaging may be also be found in the assessment / plan section):     Results for orders placed or performed in visit on 03/09/23  POCT HgB A1C  Result Value Ref Range   Hemoglobin A1C 8.2 (A) 4.0 - 5.6 %      No image results found.        Signed: Loralee Pacas, MD 03/09/2023 6:26 PM

## 2023-03-09 NOTE — Assessment & Plan Note (Addendum)
Reviewed home and lab sugars 160's Checked Hemoglobin A1c = 8.2 poorly controlled  Encouraged patient to bring sugar meter next time and check more frequently Was on gabapentin for neuropathy but unsure if taking Encouraged continuing with metformin but increase dose He declined referral for eye exam- due to working wants to reduce visits He feels he is not able to stop eating the rice that is the main food source. Encouraged replace with nonstarchy vegetables

## 2023-03-09 NOTE — Assessment & Plan Note (Signed)
Not sure about cause.  Will put him on potassium for a while Maybe just due to hard work and sweating... maybe a medication side effect(s)

## 2023-03-09 NOTE — Assessment & Plan Note (Signed)
Spent 5 minutes demonstrating how to collect Cologuard with interpreter present so he understands, reordered so he will complete next time

## 2023-03-13 ENCOUNTER — Inpatient Hospital Stay: Payer: BC Managed Care – PPO

## 2023-03-13 VITALS — BP 134/83 | HR 71 | Temp 98.3°F | Resp 18

## 2023-03-13 DIAGNOSIS — C9001 Multiple myeloma in remission: Secondary | ICD-10-CM

## 2023-03-13 DIAGNOSIS — Z7189 Other specified counseling: Secondary | ICD-10-CM

## 2023-03-13 DIAGNOSIS — C9000 Multiple myeloma not having achieved remission: Secondary | ICD-10-CM | POA: Diagnosis not present

## 2023-03-13 LAB — COMPREHENSIVE METABOLIC PANEL
ALT: 17 U/L (ref 0–44)
AST: 14 U/L — ABNORMAL LOW (ref 15–41)
Albumin: 4 g/dL (ref 3.5–5.0)
Alkaline Phosphatase: 45 U/L (ref 38–126)
Anion gap: 9 (ref 5–15)
BUN: 16 mg/dL (ref 6–20)
CO2: 23 mmol/L (ref 22–32)
Calcium: 8.7 mg/dL — ABNORMAL LOW (ref 8.9–10.3)
Chloride: 103 mmol/L (ref 98–111)
Creatinine, Ser: 0.72 mg/dL (ref 0.61–1.24)
GFR, Estimated: >60 mL/min (ref >60)
Glucose, Bld: 301 mg/dL — ABNORMAL HIGH (ref 70–99)
Potassium: 3.5 mmol/L (ref 3.5–5.1)
Sodium: 135 mmol/L (ref 135–145)
Total Bilirubin: 0.5 mg/dL (ref 0.3–1.2)
Total Protein: 6.4 g/dL — ABNORMAL LOW (ref 6.5–8.1)

## 2023-03-13 LAB — CBC WITH DIFFERENTIAL (CANCER CENTER ONLY)
Abs Immature Granulocytes: 0.02 10*3/uL (ref 0.00–0.07)
Basophils Absolute: 0.1 10*3/uL (ref 0.0–0.1)
Basophils Relative: 2 %
Eosinophils Absolute: 0.3 10*3/uL (ref 0.0–0.5)
Eosinophils Relative: 4 %
HCT: 40.8 % (ref 39.0–52.0)
Hemoglobin: 14 g/dL (ref 13.0–17.0)
Immature Granulocytes: 0 %
Lymphocytes Relative: 42 %
Lymphs Abs: 3.3 10*3/uL (ref 0.7–4.0)
MCH: 26.8 pg (ref 26.0–34.0)
MCHC: 34.3 g/dL (ref 30.0–36.0)
MCV: 78.2 fL — ABNORMAL LOW (ref 80.0–100.0)
Monocytes Absolute: 0.8 10*3/uL (ref 0.1–1.0)
Monocytes Relative: 10 %
Neutro Abs: 3.3 10*3/uL (ref 1.7–7.7)
Neutrophils Relative %: 42 %
Platelet Count: 206 10*3/uL (ref 150–400)
RBC: 5.22 MIL/uL (ref 4.22–5.81)
RDW: 14.4 % (ref 11.5–15.5)
WBC Count: 7.7 10*3/uL (ref 4.0–10.5)
nRBC: 0 % (ref 0.0–0.2)

## 2023-03-13 MED ORDER — SODIUM CHLORIDE 0.9 % IV SOLN: Freq: Once | INTRAVENOUS | Status: AC

## 2023-03-13 MED ORDER — ACETAMINOPHEN 325 MG PO TABS
650.0000 mg | ORAL_TABLET | Freq: Once | ORAL | Status: AC
  Administered 2023-03-13: 650 mg via ORAL
  Filled 2023-03-13: qty 2

## 2023-03-13 MED ORDER — FAMOTIDINE IN NACL 20-0.9 MG/50ML-% IV SOLN
20.0000 mg | Freq: Once | INTRAVENOUS | Status: AC
  Administered 2023-03-13: 20 mg via INTRAVENOUS
  Filled 2023-03-13: qty 50

## 2023-03-13 MED ORDER — SODIUM CHLORIDE 0.9 % IV SOLN
16.0000 mg/kg | Freq: Once | INTRAVENOUS | Status: AC
  Administered 2023-03-13: 1100 mg via INTRAVENOUS
  Filled 2023-03-13: qty 15

## 2023-03-13 MED ORDER — SODIUM CHLORIDE 0.9 % IV SOLN
12.0000 mg | Freq: Once | INTRAVENOUS | Status: AC
  Administered 2023-03-13: 12 mg via INTRAVENOUS
  Filled 2023-03-13: qty 1.2

## 2023-03-13 MED ORDER — ZOLEDRONIC ACID 4 MG/100ML IV SOLN
4.0000 mg | Freq: Once | INTRAVENOUS | Status: AC
  Administered 2023-03-13: 4 mg via INTRAVENOUS
  Filled 2023-03-13: qty 100

## 2023-03-13 MED ORDER — MONTELUKAST SODIUM 10 MG PO TABS
10.0000 mg | ORAL_TABLET | Freq: Once | ORAL | Status: AC
  Administered 2023-03-13: 10 mg via ORAL
  Filled 2023-03-13: qty 1

## 2023-03-13 MED ORDER — DIPHENHYDRAMINE HCL 25 MG PO CAPS
50.0000 mg | ORAL_CAPSULE | Freq: Once | ORAL | Status: AC
  Administered 2023-03-13: 50 mg via ORAL
  Filled 2023-03-13: qty 2

## 2023-03-13 NOTE — Patient Instructions (Signed)
[facility]  Discharge Instructions: Thank you for choosing [facility] to provide your oncology and hematology care.   If you have a lab appointment with the Cancer Center, please go directly to the Cancer Center and check in at the registration area.   Wear comfortable clothing and clothing appropriate for easy access to any Portacath or PICC line.   We strive to give you quality time with your provider. You may need to reschedule your appointment if you arrive late (15 or more minutes).  Arriving late affects you and other patients whose appointments are after yours.  Also, if you miss three or more appointments without notifying the office, you may be dismissed from the clinic at the provider's discretion.      For prescription refill requests, have your pharmacy contact our office and allow 72 hours for refills to be completed.    Today you received the following chemotherapy and/or immunotherapy agents: Daratumumab.       To help prevent nausea and vomiting after your treatment, we encourage you to take your nausea medication as directed.  BELOW ARE SYMPTOMS THAT SHOULD BE REPORTED IMMEDIATELY: *FEVER GREATER THAN 100.4 F (38 C) OR HIGHER *CHILLS OR SWEATING *NAUSEA AND VOMITING THAT IS NOT CONTROLLED WITH YOUR NAUSEA MEDICATION *UNUSUAL SHORTNESS OF BREATH *UNUSUAL BRUISING OR BLEEDING *URINARY PROBLEMS (pain or burning when urinating, or frequent urination) *BOWEL PROBLEMS (unusual diarrhea, constipation, pain near the anus) TENDERNESS IN MOUTH AND THROAT WITH OR WITHOUT PRESENCE OF ULCERS (sore throat, sores in mouth, or a toothache) UNUSUAL RASH, SWELLING OR PAIN  UNUSUAL VAGINAL DISCHARGE OR ITCHING   Items with * indicate a potential emergency and should be followed up as soon as possible or go to the Emergency Department if any problems should occur.  Please show the CHEMOTHERAPY ALERT CARD or IMMUNOTHERAPY ALERT CARD at  check-in to the Emergency Department and triage nurse.  Should you have questions after your visit or need to cancel or reschedule your appointment, please contact [facility]  Dept: [phone]  and follow the prompts.  Office hours are 8:00 a.m. to 4:30 p.m. Monday - Friday. Please note that voicemails left after 4:00 p.m. may not be returned until the following business day.  We are closed weekends and major holidays. You have access to a nurse at all times for urgent questions. Please call the main number to the clinic Dept: [phone] and follow the prompts.   For any non-urgent questions, you may also contact your provider using MyChart. We now offer e-Visits for anyone 18 and older to request care online for non-urgent symptoms. For details visit [email or web address].   Also download the MyChart app! Go to the app store, search "MyChart", open the app, select [place], and log in with your MyChart username and password.   

## 2023-03-18 LAB — MULTIPLE MYELOMA PANEL, SERUM
Albumin SerPl Elph-Mcnc: 3.6 g/dL (ref 2.9–4.4)
Albumin/Glob SerPl: 1.6 (ref 0.7–1.7)
Alpha 1: 0.2 g/dL (ref 0.0–0.4)
Alpha2 Glob SerPl Elph-Mcnc: 0.8 g/dL (ref 0.4–1.0)
B-Globulin SerPl Elph-Mcnc: 0.9 g/dL (ref 0.7–1.3)
Gamma Glob SerPl Elph-Mcnc: 0.5 g/dL (ref 0.4–1.8)
Globulin, Total: 2.4 g/dL (ref 2.2–3.9)
IgA: 111 mg/dL (ref 90–386)
IgG (Immunoglobin G), Serum: 573 mg/dL — ABNORMAL LOW (ref 603–1613)
IgM (Immunoglobulin M), Srm: 23 mg/dL (ref 20–172)
M Protein SerPl Elph-Mcnc: 0.1 g/dL — ABNORMAL HIGH
Total Protein ELP: 6 g/dL (ref 6.0–8.5)

## 2023-03-23 NOTE — Progress Notes (Signed)
Pt given Revlimid on 03/13/23 to start on 03/14/23. Pt to take daily for 21 days then take 7 days off. Take 03/14/23 - 04/03/23. Pt to take off 04/04/23 - 04/10/23.

## 2023-03-27 ENCOUNTER — Inpatient Hospital Stay: Payer: BC Managed Care – PPO | Admitting: Physician Assistant

## 2023-03-27 ENCOUNTER — Other Ambulatory Visit: Payer: Self-pay

## 2023-03-27 ENCOUNTER — Inpatient Hospital Stay: Payer: BC Managed Care – PPO

## 2023-03-27 VITALS — BP 126/74 | HR 73 | Temp 98.1°F | Resp 16 | Ht 66.0 in | Wt 175.4 lb

## 2023-03-27 DIAGNOSIS — Z7189 Other specified counseling: Secondary | ICD-10-CM | POA: Diagnosis not present

## 2023-03-27 DIAGNOSIS — C9000 Multiple myeloma not having achieved remission: Secondary | ICD-10-CM | POA: Diagnosis not present

## 2023-03-27 DIAGNOSIS — C9001 Multiple myeloma in remission: Secondary | ICD-10-CM

## 2023-03-27 LAB — COMPREHENSIVE METABOLIC PANEL
ALT: 22 U/L (ref 0–44)
AST: 15 U/L (ref 15–41)
Albumin: 4.3 g/dL (ref 3.5–5.0)
Alkaline Phosphatase: 55 U/L (ref 38–126)
Anion gap: 6 (ref 5–15)
BUN: 13 mg/dL (ref 6–20)
CO2: 27 mmol/L (ref 22–32)
Calcium: 9 mg/dL (ref 8.9–10.3)
Chloride: 107 mmol/L (ref 98–111)
Creatinine, Ser: 0.82 mg/dL (ref 0.61–1.24)
GFR, Estimated: >60 mL/min (ref >60)
Glucose, Bld: 135 mg/dL — ABNORMAL HIGH (ref 70–99)
Potassium: 4 mmol/L (ref 3.5–5.1)
Sodium: 140 mmol/L (ref 135–145)
Total Bilirubin: 0.6 mg/dL (ref 0.3–1.2)
Total Protein: 7 g/dL (ref 6.5–8.1)

## 2023-03-27 LAB — CBC WITH DIFFERENTIAL (CANCER CENTER ONLY)
Abs Immature Granulocytes: 0.03 10*3/uL (ref 0.00–0.07)
Basophils Absolute: 0.1 10*3/uL (ref 0.0–0.1)
Basophils Relative: 1 %
Eosinophils Absolute: 0.2 10*3/uL (ref 0.0–0.5)
Eosinophils Relative: 2 %
HCT: 45.2 % (ref 39.0–52.0)
Hemoglobin: 15 g/dL (ref 13.0–17.0)
Immature Granulocytes: 0 %
Lymphocytes Relative: 34 %
Lymphs Abs: 3.5 10*3/uL (ref 0.7–4.0)
MCH: 26.4 pg (ref 26.0–34.0)
MCHC: 33.2 g/dL (ref 30.0–36.0)
MCV: 79.4 fL — ABNORMAL LOW (ref 80.0–100.0)
Monocytes Absolute: 1.3 10*3/uL — ABNORMAL HIGH (ref 0.1–1.0)
Monocytes Relative: 13 %
Neutro Abs: 5.2 10*3/uL (ref 1.7–7.7)
Neutrophils Relative %: 50 %
Platelet Count: 185 10*3/uL (ref 150–400)
RBC: 5.69 MIL/uL (ref 4.22–5.81)
RDW: 14.5 % (ref 11.5–15.5)
WBC Count: 10.3 10*3/uL (ref 4.0–10.5)
nRBC: 0 % (ref 0.0–0.2)

## 2023-03-27 MED ORDER — DIPHENHYDRAMINE HCL 25 MG PO CAPS
50.0000 mg | ORAL_CAPSULE | Freq: Once | ORAL | Status: AC
  Administered 2023-03-27: 50 mg via ORAL
  Filled 2023-03-27: qty 2

## 2023-03-27 MED ORDER — FAMOTIDINE IN NACL 20-0.9 MG/50ML-% IV SOLN
20.0000 mg | Freq: Once | INTRAVENOUS | Status: AC
  Administered 2023-03-27: 20 mg via INTRAVENOUS
  Filled 2023-03-27: qty 50

## 2023-03-27 MED ORDER — SODIUM CHLORIDE 0.9 % IV SOLN
12.0000 mg | Freq: Once | INTRAVENOUS | Status: AC
  Administered 2023-03-27: 12 mg via INTRAVENOUS
  Filled 2023-03-27: qty 1.2

## 2023-03-27 MED ORDER — ACETAMINOPHEN 325 MG PO TABS
650.0000 mg | ORAL_TABLET | Freq: Once | ORAL | Status: AC
  Administered 2023-03-27: 650 mg via ORAL
  Filled 2023-03-27: qty 2

## 2023-03-27 MED ORDER — SODIUM CHLORIDE 0.9 % IV SOLN: Freq: Once | INTRAVENOUS | Status: AC

## 2023-03-27 MED ORDER — SODIUM CHLORIDE 0.9 % IV SOLN
16.0000 mg/kg | Freq: Once | INTRAVENOUS | Status: AC
  Administered 2023-03-27: 1100 mg via INTRAVENOUS
  Filled 2023-03-27: qty 15

## 2023-03-27 MED ORDER — MONTELUKAST SODIUM 10 MG PO TABS
10.0000 mg | ORAL_TABLET | Freq: Once | ORAL | Status: AC
  Administered 2023-03-27: 10 mg via ORAL
  Filled 2023-03-27: qty 1

## 2023-03-27 NOTE — Progress Notes (Signed)
Per Raul Del, RPH, okay to give Daratumumab dosing slightly outside 10% range, 1100mg .

## 2023-03-27 NOTE — Progress Notes (Signed)
Donald Suarez  HEMATOLOGY/ONCOLOGY CLINIC NOTE  Date of Service: .03/27/2023  Chief complaint - Follow-up for continued evaluation and management of high risk multiple myeloma  Current treatment-daratumumab/Revlimid/dexamethasone   INTERVAL HISTORY:  Donald Suarez is a 57 y.o. male is here with his Donald Suarez interpreter for continued evaluation and management of his multiple myeloma. He was last seen by Dr. Caelan Atchley Limbo on 02/27/2023. He reports that he is tolerating therapy without any significant limitations.   He reports he is doing well with stable energy and appetite. He continues to work full time. He denies any nausea, vomiting or abdominal pain. His bowel habits are unchanged without any recurrent episodes of diarrhea or constipation. He has stable neuropathy which does not affect his grip or balance. He denies easy bruising or signs of active bleeding. He denies fevers, chills, sweats, shortness of breath, chest pain or cough. He has no other complaints.    MEDICAL HISTORY:  Past Medical History:  Diagnosis Date   Anemia    Elevated ferritin 08/07/2022   Hypocalcemia 08/19/2022   In setting of multiple myeloma    Microalbuminuria 08/19/2022   Peripheral neuropathy 09/08/2022   Started 08/2022 A/w diabetes and revlimid usage Burning on top of left foot    SURGICAL HISTORY: Past Surgical History:  Procedure Laterality Date   APPENDECTOMY     [place] BONE MARROW BIOPSY & ASPIRATION  02/11/2023   [place] FLUORO GUIDE CV LINE RIGHT  05/15/2022   [place] PATIENT EVAL TECH 0-60 MINS  05/19/2022   [place] US GUIDE VASC ACCESS RIGHT  05/15/2022    SOCIAL HISTORY: Social History   Socioeconomic History   Marital status: Married    Spouse name: Not on file   Number of children: Not on file   Years of education: Not on file   Highest education level: Not on file  Occupational History   Not on file  Tobacco Use   Smoking status: Never   Smokeless tobacco: Never  Vaping Use   Vaping Use: Never used  Substance  and Sexual Activity   Alcohol use: Never   Drug use: Never   Sexual activity: Not on file  Other Topics Concern   Not on file  Social History Narrative   Not on file   Social Determinants of Health   Financial Resource Strain: Medium Risk (05/21/2022)   Overall Financial Resource Strain (CARDIA)    Difficulty of Paying Living Expenses: Somewhat hard  Food Insecurity: Food Insecurity Present (05/21/2022)   Hunger Vital Sign    Worried About Running Out of Food in the Last Year: Sometimes true    Ran Out of Food in the Last Year: Sometimes true  Transportation Needs: No Transportation Needs (05/21/2022)   PRAPARE - Hydrologist (Medical): No    Lack of Transportation (Non-Medical): No  Physical Activity: Not on file  Stress: Not on file  Social Connections: Not on file  Intimate Partner Violence: Not on file    FAMILY HISTORY: No family history on file.  ALLERGIES:  has No Known Allergies.  MEDICATIONS:  . Allergies as of 03/27/2023   No Known Allergies      Medication List        Accurate as of March 27, 2023 11:11 AM. If you have any questions, ask your nurse or doctor.          Accu-Chek Guide test strip Generic drug: glucose blood Use to test blood sugars up to 4 times daily as  directed.   Accu-Chek Guide w/Device Kit Use to test blood sugars up to 4 times daily as directed.   Accu-Chek Softclix Lancets lancets Use to test blood sugars up to 4 times daily as directed.   acyclovir 400 MG tablet Commonly known as: ZOVIRAX Take 1 tablet (400 mg total) by mouth 2 (two) times daily.   aspirin EC 81 MG tablet Take 1 tablet (81 mg total) by mouth daily. Swallow whole.   B-12 1000 MCG Caps Take 1 tablet by mouth daily.   FreeStyle Libre 3 Sensor Misc 1 Application by Does not apply route every 14 (fourteen) days. Place 1 sensor on the skin every 14 days. Use to check glucose continuously   gabapentin 300 MG capsule Commonly  known as: NEURONTIN Take 1 capsule (300 mg total) by mouth 3 (three) times daily.   metFORMIN 500 MG tablet Commonly known as: GLUCOPHAGE Take 2 tablets (1,000 mg total) by mouth 2 (two) times daily with a meal. Note dose increased. Replaces 500 mg twice daily.   ondansetron 8 MG tablet Commonly known as: Zofran Take 1 tablet (8 mg total) by mouth every 8 (eight) hours as needed for nausea or vomiting.   potassium chloride SA 20 MEQ tablet Commonly known as: KLOR-CON M Take 1 tablet (20 mEq total) by mouth daily.   prochlorperazine 10 MG tablet Commonly known as: COMPAZINE Take 1 tablet (10 mg total) by mouth every 6 (six) hours as needed for nausea or vomiting.   Revlimid 25 MG capsule Generic drug: lenalidomide TAKE 1 CAPSULE BY MOUTH 1 TIME A DAY FOR 21 DAYS ON AND 7 DAYS OFF         REVIEW OF SYSTEMS:   10 Point review of Systems was done is negative except as noted above.   PHYSICAL EXAMINATION: Vitals:   03/27/23 1042  BP: 132/79  Pulse: 74  Resp: 20  Temp: 97.7 F (36.5 C)  SpO2: 100%   GENERAL:alert, in no acute distress and comfortable SKIN: no acute rashes, no significant lesions EYES: conjunctiva are pink and non-injected, sclera anicteric OROPHARYNX: MMM, no exudates, no oropharyngeal erythema or ulceration NECK: supple, no JVD LYMPH:  no palpable lymphadenopathy in the cervical or supraclavicular regions LUNGS: clear to auscultation b/l with normal respiratory effort HEART: regular rate & rhythm Extremity: no pedal edema PSYCH: alert & oriented x 3 with fluent speech NEURO: no focal motor/sensory deficits     LABORATORY DATA:  I have reviewed the data as listed  .    Latest Ref Rng & Units 03/27/2023   10:31 AM 03/13/2023   12:09 PM 02/27/2023    9:43 AM  CBC  WBC 4.0 - 10.5 K/uL 10.3  7.7  10.5   Hemoglobin 13.0 - 17.0 g/dL 15.0  14.0  14.0   Hematocrit 39.0 - 52.0 % 45.2  40.8  41.6   Platelets 150 - 400 K/uL 185  206  188     .     Latest Ref Rng & Units 03/27/2023   10:31 AM 03/13/2023   12:09 PM 02/27/2023    9:43 AM  CMP  Glucose 70 - 99 mg/dL 135  301  185   BUN 6 - 20 mg/dL 13  16  13    Creatinine 0.61 - 1.24 mg/dL 0.82  0.72  0.72   Sodium 135 - 145 mmol/L 140  135  139   Potassium 3.5 - 5.1 mmol/L 4.0  3.5  3.2   Chloride 98 - 111  mmol/L 107  103  104   CO2 22 - 32 mmol/L 27  23  26    Calcium 8.9 - 10.3 mg/dL 9.0  8.7  8.8   Total Protein 6.5 - 8.1 g/dL 7.0  6.4  6.8   Total Bilirubin 0.3 - 1.2 mg/dL 0.6  0.5  0.8   Alkaline Phos 38 - 126 U/L 55  45  41   AST 15 - 41 U/L 15  14  26    ALT 0 - 44 U/L 22  17  22     . Lab Results  Component Value Date   LDH 114 10/10/2022      RADIOGRAPHIC STUDIES: I have personally reviewed the radiological images as listed and agreed with the findings in the report. No results found.  ASSESSMENT & PLAN:   1) R-ISS stage III high risk IgG Lambda Multiple myeloma not on treatment for more than 1 year due to patient's lack of follow-up. -Patient diagnosed December 2021 and received 1 cycle of CyBorD. -Had previously presented with anemia renal insufficiency and hyperviscosity symptoms. -Bone marrow Bx- 90% plasma cells -Cytogenetics showed: Del(1p): Not Detected  Dup(1q): Not Detected  Gains(15): DETECTED  Gains(5 and 9): Not Detected  Del(13q)/-13: DETECTED  Del(17p)(TP53): Not Detected  IGH(Rearrangement): SEE BELOW   IgH complex: t(4;14): DETECTED  t(11;14): Not Detected  t(14;16): Not Detected  t(14;20): Not Detected   Cytogenetics: Normal male karyotype.   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 No cytopenias. Calcium, creatinine and LFTs normal. Most recent myeloma labs from 03/13/2023 showed that M protein is stable at 0.1.  -Tolerating Daratumumab/Revlimid/Dex without any prohibitive toxicities.   -Patient is not interested in autologous bone marrow transplantation at this time -Proceed with cycle 6, Day 15 of daratumumab/dex today -Last Zometa was on 03/13/2023. Next due next treatment due 04/10/2023  Follow-up -Continue with labs, daratumumab/dex q 2 weeks -RTC for a follow up visit with Dr. Renita Brocks Limbo on 04/24/2023.    All of the patient's questions were answered with apparent satisfaction. The patient knows to call the clinic with any problems, questions or concerns.  I have spent a total of 30 minutes minutes of face-to-face and non-face-to-face time, preparing to see the patient,  performing a medically appropriate examination, counseling and educating the patient, ordering tests/procedures, documenting clinical information in the electronic health record,and care coordination.   Dede Query PA-C Dept of Hematology and [place] at [facility] Phone: [phone]

## 2023-03-27 NOTE — Patient Instructions (Signed)
[facility]  Discharge Instructions: Thank you for choosing [facility] to provide your oncology and hematology care.   If you have a lab appointment with the Cancer Center, please go directly to the Cancer Center and check in at the registration area.   Wear comfortable clothing and clothing appropriate for easy access to any Portacath or PICC line.   We strive to give you quality time with your provider. You may need to reschedule your appointment if you arrive late (15 or more minutes).  Arriving late affects you and other patients whose appointments are after yours.  Also, if you miss three or more appointments without notifying the office, you may be dismissed from the clinic at the provider's discretion.      For prescription refill requests, have your pharmacy contact our office and allow 72 hours for refills to be completed.    Today you received the following chemotherapy and/or immunotherapy agents: Daratumumab.       To help prevent nausea and vomiting after your treatment, we encourage you to take your nausea medication as directed.  BELOW ARE SYMPTOMS THAT SHOULD BE REPORTED IMMEDIATELY: *FEVER GREATER THAN 100.4 F (38 C) OR HIGHER *CHILLS OR SWEATING *NAUSEA AND VOMITING THAT IS NOT CONTROLLED WITH YOUR NAUSEA MEDICATION *UNUSUAL SHORTNESS OF BREATH *UNUSUAL BRUISING OR BLEEDING *URINARY PROBLEMS (pain or burning when urinating, or frequent urination) *BOWEL PROBLEMS (unusual diarrhea, constipation, pain near the anus) TENDERNESS IN MOUTH AND THROAT WITH OR WITHOUT PRESENCE OF ULCERS (sore throat, sores in mouth, or a toothache) UNUSUAL RASH, SWELLING OR PAIN  UNUSUAL VAGINAL DISCHARGE OR ITCHING   Items with * indicate a potential emergency and should be followed up as soon as possible or go to the Emergency Department if any problems should occur.  Please show the CHEMOTHERAPY ALERT CARD or IMMUNOTHERAPY ALERT CARD at  check-in to the Emergency Department and triage nurse.  Should you have questions after your visit or need to cancel or reschedule your appointment, please contact [facility]  Dept: [phone]  and follow the prompts.  Office hours are 8:00 a.m. to 4:30 p.m. Monday - Friday. Please note that voicemails left after 4:00 p.m. may not be returned until the following business day.  We are closed weekends and major holidays. You have access to a nurse at all times for urgent questions. Please call the main number to the clinic Dept: [phone] and follow the prompts.   For any non-urgent questions, you may also contact your provider using MyChart. We now offer e-Visits for anyone 18 and older to request care online for non-urgent symptoms. For details visit [email or web address].   Also download the MyChart app! Go to the app store, search "MyChart", open the app, select , and log in with your MyChart username and password.   

## 2023-03-31 ENCOUNTER — Other Ambulatory Visit: Payer: Self-pay

## 2023-04-01 ENCOUNTER — Other Ambulatory Visit: Payer: Self-pay | Admitting: Hematology

## 2023-04-01 DIAGNOSIS — C9000 Multiple myeloma not having achieved remission: Secondary | ICD-10-CM

## 2023-04-02 ENCOUNTER — Other Ambulatory Visit: Payer: Self-pay

## 2023-04-02 ENCOUNTER — Encounter: Payer: Self-pay | Admitting: Hematology

## 2023-04-08 ENCOUNTER — Other Ambulatory Visit: Payer: Self-pay

## 2023-04-08 DIAGNOSIS — C9001 Multiple myeloma in remission: Secondary | ICD-10-CM

## 2023-04-10 ENCOUNTER — Inpatient Hospital Stay: Payer: BC Managed Care – PPO

## 2023-04-10 ENCOUNTER — Other Ambulatory Visit: Payer: Self-pay | Admitting: Hematology

## 2023-04-10 ENCOUNTER — Other Ambulatory Visit: Payer: Self-pay

## 2023-04-10 ENCOUNTER — Inpatient Hospital Stay: Payer: BC Managed Care – PPO | Attending: Hematology

## 2023-04-10 VITALS — BP 140/86 | HR 82 | Temp 98.2°F | Resp 16 | Wt 178.0 lb

## 2023-04-10 DIAGNOSIS — C9000 Multiple myeloma not having achieved remission: Secondary | ICD-10-CM | POA: Diagnosis not present

## 2023-04-10 DIAGNOSIS — D751 Secondary polycythemia: Secondary | ICD-10-CM | POA: Insufficient documentation

## 2023-04-10 DIAGNOSIS — C9001 Multiple myeloma in remission: Secondary | ICD-10-CM

## 2023-04-10 DIAGNOSIS — E1142 Type 2 diabetes mellitus with diabetic polyneuropathy: Secondary | ICD-10-CM | POA: Insufficient documentation

## 2023-04-10 DIAGNOSIS — D696 Thrombocytopenia, unspecified: Secondary | ICD-10-CM | POA: Insufficient documentation

## 2023-04-10 DIAGNOSIS — Z7189 Other specified counseling: Secondary | ICD-10-CM

## 2023-04-10 DIAGNOSIS — D649 Anemia, unspecified: Secondary | ICD-10-CM | POA: Insufficient documentation

## 2023-04-10 DIAGNOSIS — Z5112 Encounter for antineoplastic immunotherapy: Secondary | ICD-10-CM | POA: Insufficient documentation

## 2023-04-10 DIAGNOSIS — Z79899 Other long term (current) drug therapy: Secondary | ICD-10-CM | POA: Insufficient documentation

## 2023-04-10 DIAGNOSIS — G629 Polyneuropathy, unspecified: Secondary | ICD-10-CM | POA: Diagnosis not present

## 2023-04-10 LAB — CMP (CANCER CENTER ONLY)
ALT: 25 U/L (ref 0–44)
AST: 14 U/L — ABNORMAL LOW (ref 15–41)
Albumin: 4.1 g/dL (ref 3.5–5.0)
Alkaline Phosphatase: 47 U/L (ref 38–126)
Anion gap: 8 (ref 5–15)
BUN: 12 mg/dL (ref 6–20)
CO2: 24 mmol/L (ref 22–32)
Calcium: 8.8 mg/dL — ABNORMAL LOW (ref 8.9–10.3)
Chloride: 103 mmol/L (ref 98–111)
Creatinine: 0.78 mg/dL (ref 0.61–1.24)
GFR, Estimated: >60 mL/min (ref >60)
Glucose, Bld: 275 mg/dL — ABNORMAL HIGH (ref 70–99)
Potassium: 3.5 mmol/L (ref 3.5–5.1)
Sodium: 135 mmol/L (ref 135–145)
Total Bilirubin: 0.6 mg/dL (ref 0.3–1.2)
Total Protein: 6.5 g/dL (ref 6.5–8.1)

## 2023-04-10 LAB — CBC WITH DIFFERENTIAL (CANCER CENTER ONLY)
Abs Immature Granulocytes: 0.04 10*3/uL (ref 0.00–0.07)
Basophils Absolute: 0.1 10*3/uL (ref 0.0–0.1)
Basophils Relative: 1 %
Eosinophils Absolute: 0.2 10*3/uL (ref 0.0–0.5)
Eosinophils Relative: 2 %
HCT: 42.9 % (ref 39.0–52.0)
Hemoglobin: 14.3 g/dL (ref 13.0–17.0)
Immature Granulocytes: 1 %
Lymphocytes Relative: 31 %
Lymphs Abs: 2.4 10*3/uL (ref 0.7–4.0)
MCH: 26 pg (ref 26.0–34.0)
MCHC: 33.3 g/dL (ref 30.0–36.0)
MCV: 77.9 fL — ABNORMAL LOW (ref 80.0–100.0)
Monocytes Absolute: 0.6 10*3/uL (ref 0.1–1.0)
Monocytes Relative: 7 %
Neutro Abs: 4.7 10*3/uL (ref 1.7–7.7)
Neutrophils Relative %: 58 %
Platelet Count: 200 10*3/uL (ref 150–400)
RBC: 5.51 MIL/uL (ref 4.22–5.81)
RDW: 14.6 % (ref 11.5–15.5)
WBC Count: 7.9 10*3/uL (ref 4.0–10.5)
nRBC: 0 % (ref 0.0–0.2)

## 2023-04-10 MED ORDER — SODIUM CHLORIDE 0.9 % IV SOLN: Freq: Once | INTRAVENOUS | Status: AC

## 2023-04-10 MED ORDER — FAMOTIDINE IN NACL 20-0.9 MG/50ML-% IV SOLN
20.0000 mg | Freq: Once | INTRAVENOUS | Status: AC
  Administered 2023-04-10: 20 mg via INTRAVENOUS
  Filled 2023-04-10: qty 50

## 2023-04-10 MED ORDER — ZOLEDRONIC ACID 4 MG/100ML IV SOLN
4.0000 mg | Freq: Once | INTRAVENOUS | Status: AC
  Administered 2023-04-10: 4 mg via INTRAVENOUS
  Filled 2023-04-10: qty 100

## 2023-04-10 MED ORDER — SODIUM CHLORIDE 0.9 % IV SOLN
12.0000 mg | Freq: Once | INTRAVENOUS | Status: AC
  Administered 2023-04-10: 12 mg via INTRAVENOUS
  Filled 2023-04-10: qty 1.2

## 2023-04-10 MED ORDER — ACETAMINOPHEN 325 MG PO TABS
650.0000 mg | ORAL_TABLET | Freq: Once | ORAL | Status: AC
  Administered 2023-04-10: 650 mg via ORAL
  Filled 2023-04-10: qty 2

## 2023-04-10 MED ORDER — SODIUM CHLORIDE 0.9 % IV SOLN
16.0000 mg/kg | Freq: Once | INTRAVENOUS | Status: AC
  Administered 2023-04-10: 1100 mg via INTRAVENOUS
  Filled 2023-04-10: qty 15

## 2023-04-10 MED ORDER — DIPHENHYDRAMINE HCL 25 MG PO CAPS
50.0000 mg | ORAL_CAPSULE | Freq: Once | ORAL | Status: AC
  Administered 2023-04-10: 50 mg via ORAL
  Filled 2023-04-10: qty 2

## 2023-04-10 MED ORDER — MONTELUKAST SODIUM 10 MG PO TABS
10.0000 mg | ORAL_TABLET | Freq: Once | ORAL | Status: AC
  Administered 2023-04-10: 10 mg via ORAL
  Filled 2023-04-10: qty 1

## 2023-04-10 NOTE — Patient Instructions (Signed)
[facility]  Discharge Instructions: Thank you for choosing [facility] to provide your oncology and hematology care.   If you have a lab appointment with the Cancer Center, please go directly to the Cancer Center and check in at the registration area.   Wear comfortable clothing and clothing appropriate for easy access to any Portacath or PICC line.   We strive to give you quality time with your provider. You may need to reschedule your appointment if you arrive late (15 or more minutes).  Arriving late affects you and other patients whose appointments are after yours.  Also, if you miss three or more appointments without notifying the office, you may be dismissed from the clinic at the provider's discretion.      For prescription refill requests, have your pharmacy contact our office and allow 72 hours for refills to be completed.    Today you received the following chemotherapy and/or immunotherapy agents: Darzalex    To help prevent nausea and vomiting after your treatment, we encourage you to take your nausea medication as directed.  BELOW ARE SYMPTOMS THAT SHOULD BE REPORTED IMMEDIATELY: *FEVER GREATER THAN 100.4 F (38 C) OR HIGHER *CHILLS OR SWEATING *NAUSEA AND VOMITING THAT IS NOT CONTROLLED WITH YOUR NAUSEA MEDICATION *UNUSUAL SHORTNESS OF BREATH *UNUSUAL BRUISING OR BLEEDING *URINARY PROBLEMS (pain or burning when urinating, or frequent urination) *BOWEL PROBLEMS (unusual diarrhea, constipation, pain near the anus) TENDERNESS IN MOUTH AND THROAT WITH OR WITHOUT PRESENCE OF ULCERS (sore throat, sores in mouth, or a toothache) UNUSUAL RASH, SWELLING OR PAIN  UNUSUAL VAGINAL DISCHARGE OR ITCHING   Items with * indicate a potential emergency and should be followed up as soon as possible or go to the Emergency Department if any problems should occur.  Please show the CHEMOTHERAPY ALERT CARD or IMMUNOTHERAPY ALERT CARD at check-in  to the Emergency Department and triage nurse.  Should you have questions after your visit or need to cancel or reschedule your appointment, please contact [facility] [place]  Dept: [phone]  and follow the prompts.  Office hours are 8:00 a.m. to 4:30 p.m. Monday - Friday. Please note that voicemails left after 4:00 p.m. may not be returned until the following business day.  We are closed weekends and major holidays. You have access to a nurse at all times for urgent questions. Please call the main number to the clinic Dept: [phone] and follow the prompts.   For any non-urgent questions, you may also contact your provider using MyChart. We now offer e-Visits for anyone 18 and older to request care online for non-urgent symptoms. For details visit [email or web address].   Also download the MyChart app! Go to the app store, search "MyChart", open the app, select [facility], and log in with your MyChart username and password.   

## 2023-04-10 NOTE — Progress Notes (Signed)
Per Dr. Candise Che OK to proceed with Zometa today with Calcium 8.8 and corrected calcium 8.72.

## 2023-04-13 NOTE — Progress Notes (Signed)
Pt given Revlimid on 04/10/23. Pt to start 21 day cycle on 04/11/23. Pt will take 04/11/23 - 05/01/23. Pt will take no med on 05/02/23 - 05/08/23. Pt acknowledged information and verbalized understanding.

## 2023-04-15 LAB — MULTIPLE MYELOMA PANEL, SERUM
Albumin SerPl Elph-Mcnc: 3.6 g/dL (ref 2.9–4.4)
Albumin/Glob SerPl: 1.5 (ref 0.7–1.7)
Alpha 1: 0.3 g/dL (ref 0.0–0.4)
Alpha2 Glob SerPl Elph-Mcnc: 0.8 g/dL (ref 0.4–1.0)
B-Globulin SerPl Elph-Mcnc: 0.9 g/dL (ref 0.7–1.3)
Gamma Glob SerPl Elph-Mcnc: 0.5 g/dL (ref 0.4–1.8)
Globulin, Total: 2.5 g/dL (ref 2.2–3.9)
IgA: 105 mg/dL (ref 90–386)
IgG (Immunoglobin G), Serum: 564 mg/dL — ABNORMAL LOW (ref 603–1613)
IgM (Immunoglobulin M), Srm: 22 mg/dL (ref 20–172)
Total Protein ELP: 6.1 g/dL (ref 6.0–8.5)

## 2023-04-21 ENCOUNTER — Other Ambulatory Visit: Payer: Self-pay

## 2023-04-22 ENCOUNTER — Telehealth: Payer: Self-pay | Admitting: Pharmacy Technician

## 2023-04-22 NOTE — Telephone Encounter (Signed)
Oral Oncology Patient Advocate Encounter   Received notification that prior authorization for lenalidomide is due for renewal.   PA submitted on 04/22/23 Key [id number] Status is pending     Jinger Neighbors, CPhT-Adv Oncology Pharmacy Patient Advocate [facility] Direct Number: [phone]  Fax: [phone]

## 2023-04-23 ENCOUNTER — Other Ambulatory Visit (HOSPITAL_COMMUNITY): Payer: Self-pay

## 2023-04-23 NOTE — Progress Notes (Signed)
HEMATOLOGY/ONCOLOGY CLINIC NOTE  Date of Service: 04/24/2023  Chief complaint - Follow-up for continued evaluation and management of high risk multiple myeloma  Current treatment-daratumumab/Revlimid/dexamethasone Has not f/u on and does not appear keen to consider Melphalan/HDT-AUTOHSCT  INTERVAL HISTORY:  Donald Suarez is a 57 y.o. male is here for continued evaluation and management of his multiple myeloma. Patient was last seen by me on 02/27/2023 and was doing well overall with no new medical concerns.  Patient was most recently seen by Thayil PA on 03/27/2023 and was doing well overall with stable neuropathy.  Today, he is scheduled to receive day 15 cycle 7 of treatment and is accompanied by an interpreter. He reports that he is feeling well overall and denies any infection issues, new headaches, dizziness/lightheadedness, SOB, chest pain, bone pains, or back pain.  Patient has been eating well and notes that his weight has increased. He denies any toxicities with Revlimid. He denies any diarrhea, constipation, or nausea.  He continues to work 5x a week and his energy levels have been normal.  MEDICAL HISTORY:  Past Medical History:  Diagnosis Date   Anemia    Elevated ferritin 08/07/2022   Hypocalcemia 08/19/2022   In setting of multiple myeloma    Microalbuminuria 08/19/2022   Peripheral neuropathy 09/08/2022   Started 08/2022 A/w diabetes and revlimid usage Burning on top of left foot    SURGICAL HISTORY: Past Surgical History:  Procedure Laterality Date   APPENDECTOMY     Josha BONE MARROW BIOPSY & ASPIRATION  02/11/2023   Corwyn FLUORO GUIDE CV LINE RIGHT  05/15/2022   Jabree PATIENT EVAL TECH 0-60 MINS  05/19/2022   Jontez US GUIDE VASC ACCESS RIGHT  05/15/2022    SOCIAL HISTORY: Social History   Socioeconomic History   Marital status: Married    Spouse name: Not on file   Number of children: Not on file   Years of education: Not on file   Highest education level: Not on file   Occupational History   Not on file  Tobacco Use   Smoking status: Never   Smokeless tobacco: Never  Vaping Use   Vaping Use: Never used  Substance and Sexual Activity   Alcohol use: Never   Drug use: Never   Sexual activity: Not on file  Other Topics Concern   Not on file  Social History Narrative   Not on file   Social Determinants of Health   Financial Resource Strain: Medium Risk (05/21/2022)   Overall Financial Resource Strain (CARDIA)    Difficulty of Paying Living Expenses: Somewhat hard  Food Insecurity: Food Insecurity Present (05/21/2022)   Hunger Vital Sign    Worried About Running Out of Food in the Last Year: Sometimes true    Ran Out of Food in the Last Year: Sometimes true  Transportation Needs: No Transportation Needs (05/21/2022)   PRAPARE - Administrator, Civil Service (Medical): No    Lack of Transportation (Non-Medical): No  Physical Activity: Not on file  Stress: Not on file  Social Connections: Not on file  Intimate Partner Violence: Not on file    FAMILY HISTORY: No family history on file.  ALLERGIES:  has No Known Allergies.  MEDICATIONS:  . Allergies as of 04/24/2023   No Known Allergies      Medication List        Accurate as of April 23, 2023  8:15 AM. If you have any questions, ask your nurse or doctor.  Accu-Chek Guide test strip Generic drug: glucose blood Use to test blood sugars up to 4 times daily as directed.   Accu-Chek Guide w/Device Kit Use to test blood sugars up to 4 times daily as directed.   Accu-Chek Softclix Lancets lancets Use to test blood sugars up to 4 times daily as directed.   acyclovir 400 MG tablet Commonly known as: ZOVIRAX Take 1 tablet (400 mg total) by mouth 2 (two) times daily.   aspirin EC 81 MG tablet Take 1 tablet (81 mg total) by mouth daily. Swallow whole.   B-12 1000 MCG Caps Take 1 tablet by mouth daily.   FreeStyle Libre 3 Sensor Misc 1 Application by Does not  apply route every 14 (fourteen) days. Place 1 sensor on the skin every 14 days. Use to check glucose continuously   gabapentin 300 MG capsule Commonly known as: NEURONTIN Take 1 capsule (300 mg total) by mouth 3 (three) times daily.   metFORMIN 500 MG tablet Commonly known as: GLUCOPHAGE Take 2 tablets (1,000 mg total) by mouth 2 (two) times daily with a meal. Note dose increased. Replaces 500 mg twice daily.   ondansetron 8 MG tablet Commonly known as: Zofran Take 1 tablet (8 mg total) by mouth every 8 (eight) hours as needed for nausea or vomiting.   potassium chloride SA 20 MEQ tablet Commonly known as: KLOR-CON M Take 1 tablet (20 mEq total) by mouth daily.   prochlorperazine 10 MG tablet Commonly known as: COMPAZINE Take 1 tablet (10 mg total) by mouth every 6 (six) hours as needed for nausea or vomiting.   Revlimid 25 MG capsule Generic drug: lenalidomide TAKE 1 CAPSULE BY MOUTH 1 TIME A DAY FOR 21 DAYS ON AND 7 DAYS OFF         REVIEW OF SYSTEMS:    10 Point review of Systems was done is negative except as noted above.   PHYSICAL EXAMINATION: .There were no vitals taken for this visit.   GENERAL:alert, in no acute distress and comfortable SKIN: no acute rashes, no significant lesions EYES: conjunctiva are pink and non-injected, sclera anicteric OROPHARYNX: MMM, no exudates, no oropharyngeal erythema or ulceration NECK: supple, no JVD LYMPH:  no palpable lymphadenopathy in the cervical, axillary or inguinal regions LUNGS: clear to auscultation b/l with normal respiratory effort HEART: regular rate & rhythm ABDOMEN:  normoactive bowel sounds , non tender, not distended. Extremity: no pedal edema PSYCH: alert & oriented x 3 with fluent speech NEURO: no focal motor/sensory deficits     LABORATORY DATA:  I have reviewed the data as listed  .    Latest Ref Rng & Units 04/10/2023   12:14 PM 03/27/2023   10:31 AM 03/13/2023   12:09 PM  CBC  WBC 4.0 - 10.5  K/uL 7.9  10.3  7.7   Hemoglobin 13.0 - 17.0 g/dL 16.1  09.6  04.5   Hematocrit 39.0 - 52.0 % 42.9  45.2  40.8   Platelets 150 - 400 K/uL 200  185  206     .    Latest Ref Rng & Units 04/10/2023   12:14 PM 03/27/2023   10:31 AM 03/13/2023   12:09 PM  CMP  Glucose 70 - 99 mg/dL 409  811  914   BUN 6 - 20 mg/dL 12  13  16    Creatinine 0.61 - 1.24 mg/dL 7.82  9.56  2.13   Sodium 135 - 145 mmol/L 135  140  135   Potassium 3.5 -  5.1 mmol/L 3.5  4.0  3.5   Chloride 98 - 111 mmol/L 103  107  103   CO2 22 - 32 mmol/L 24  27  23    Calcium 8.9 - 10.3 mg/dL 8.8  9.0  8.7   Total Protein 6.5 - 8.1 g/dL 6.5  7.0  6.4   Total Bilirubin 0.3 - 1.2 mg/dL 0.6  0.6  0.5   Alkaline Phos 38 - 126 U/L 47  55  45   AST 15 - 41 U/L 14  15  14    ALT 0 - 44 U/L 25  22  17     . Lab Results  Component Value Date   LDH 114 10/10/2022    Bone marrow biopsy 02/11/2023   RADIOGRAPHIC STUDIES: I have personally reviewed the radiological images as listed and agreed with the findings in the report. No results found.  ASSESSMENT & PLAN:   1) R-ISS stage III high risk IgG Lambda Multiple myeloma not on treatment for more than 1 year due to patient's lack of follow-up.  Patient diagnosed December 2021 and received 1 cycle of CyBorD. Had previously presented with anemia renal insufficiency and hyperviscosity symptoms. Bx- 90% plasma cells Initial M spike 8.03g/dl UJW(1X): Not Detected  Dup(1q): Not Detected  Gains(15): DETECTED  Gains(5 and 9): Not Detected  Del(13q)/-13: DETECTED  Del(17p)(TP53): Not Detected  IGH(Rearrangement): SEE BELOW   IgH complex: t(4;14): DETECTED  t(11;14): Not Detected  t(14;16): Not Detected  t(14;20): Not Detected   Cytogenetics: Normal male karyotype.   -Labs from 05/13/2022-beta-2 microglobulin 4.4, LDH 197, lambda free light chain 77.1, kappa, lambda light chain ratio 0.08, multiple myeloma panel pending.  UPEP ordered but not yet collected. 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  Due to multiple myeloma   PLAN  -Discussed lab results on 04/24/2023 in detail with patient. CBC normal showed WBC of 7.9K, hemoglobin of 14.1, and platelets of 190K. -last myeloma lab showed undetectable M spike,  -Myeloma panel today pending -patient continues to be on maintenance Daratumumab/Revlimid treatment for high risk myeloma and denies any toxicities -will continue to minimize treatment -will lower dose of Revlimid to 15 mg a month po 3 weeks on 1 week off -informed patient that steroid treatment may increase appetite -Will lower dose of Dexamethasone to 10 MG to relieve any bothersome weight gain symptoms -Continue monthly Daratumumab infusions -Continue Zometa injections   Follow-up Per integrated scheduling  The total time spent in the appointment was *** minutes* .  All of the patient's questions were answered with apparent satisfaction. The patient knows to call the clinic with any problems, questions or concerns.   Wyvonnia Lora MD MS AAHIVMS [facility] Hematology/Oncology Physician [facility]  .*Total Encounter Time as defined by the Centers for Medicare and Medicaid Services includes, in addition to the face-to-face time of a patient visit (documented in the note above) non-face-to-face time: obtaining and reviewing outside history, ordering and reviewing medications, tests or procedures, care coordination (communications with other health care  professionals or caregivers) and documentation in the  medical record.    I,Mitra Faeizi,acting as a Neurosurgeon for Wyvonnia Lora, MD.,have documented all relevant documentation on the behalf of Wyvonnia Lora, MD,as directed by  Wyvonnia Lora, MD while in the presence of Wyvonnia Lora, MD.  ***

## 2023-04-23 NOTE — Telephone Encounter (Signed)
Oral Oncology Patient Advocate Encounter  Prior Authorization for lenalidomide has been approved.    PA# [id number] Effective dates: 04/22/23 through 04/22/24  Patient must continue to fill at [facility]    Jinger Neighbors, CPhT-Adv Oncology Pharmacy Patient Advocate [facility] Direct Number: [phone]  Fax: [phone]

## 2023-04-24 ENCOUNTER — Inpatient Hospital Stay: Payer: BC Managed Care – PPO

## 2023-04-24 ENCOUNTER — Other Ambulatory Visit: Payer: Self-pay

## 2023-04-24 ENCOUNTER — Inpatient Hospital Stay: Payer: BC Managed Care – PPO | Admitting: Hematology

## 2023-04-24 VITALS — BP 149/80 | HR 70 | Temp 98.3°F | Resp 20 | Wt 176.4 lb

## 2023-04-24 VITALS — BP 122/79 | HR 67 | Resp 16

## 2023-04-24 DIAGNOSIS — Z7189 Other specified counseling: Secondary | ICD-10-CM

## 2023-04-24 DIAGNOSIS — C9001 Multiple myeloma in remission: Secondary | ICD-10-CM

## 2023-04-24 DIAGNOSIS — Z5111 Encounter for antineoplastic chemotherapy: Secondary | ICD-10-CM | POA: Diagnosis not present

## 2023-04-24 DIAGNOSIS — C9000 Multiple myeloma not having achieved remission: Secondary | ICD-10-CM | POA: Diagnosis not present

## 2023-04-24 LAB — COMPREHENSIVE METABOLIC PANEL
ALT: 16 U/L (ref 0–44)
AST: 13 U/L — ABNORMAL LOW (ref 15–41)
Albumin: 4 g/dL (ref 3.5–5.0)
Alkaline Phosphatase: 44 U/L (ref 38–126)
Anion gap: 7 (ref 5–15)
BUN: 13 mg/dL (ref 6–20)
CO2: 25 mmol/L (ref 22–32)
Calcium: 9.2 mg/dL (ref 8.9–10.3)
Chloride: 104 mmol/L (ref 98–111)
Creatinine, Ser: 0.71 mg/dL (ref 0.61–1.24)
GFR, Estimated: >60 mL/min (ref >60)
Glucose, Bld: 252 mg/dL — ABNORMAL HIGH (ref 70–99)
Potassium: 3.5 mmol/L (ref 3.5–5.1)
Sodium: 136 mmol/L (ref 135–145)
Total Bilirubin: 0.5 mg/dL (ref 0.3–1.2)
Total Protein: 6.1 g/dL — ABNORMAL LOW (ref 6.5–8.1)

## 2023-04-24 LAB — CBC WITH DIFFERENTIAL (CANCER CENTER ONLY)
Abs Immature Granulocytes: 0.02 10*3/uL (ref 0.00–0.07)
Basophils Absolute: 0.1 10*3/uL (ref 0.0–0.1)
Basophils Relative: 1 %
Eosinophils Absolute: 0.4 10*3/uL (ref 0.0–0.5)
Eosinophils Relative: 5 %
HCT: 42.2 % (ref 39.0–52.0)
Hemoglobin: 14.1 g/dL (ref 13.0–17.0)
Immature Granulocytes: 0 %
Lymphocytes Relative: 31 %
Lymphs Abs: 2.5 10*3/uL (ref 0.7–4.0)
MCH: 26.1 pg (ref 26.0–34.0)
MCHC: 33.4 g/dL (ref 30.0–36.0)
MCV: 78.1 fL — ABNORMAL LOW (ref 80.0–100.0)
Monocytes Absolute: 0.8 10*3/uL (ref 0.1–1.0)
Monocytes Relative: 11 %
Neutro Abs: 4.1 10*3/uL (ref 1.7–7.7)
Neutrophils Relative %: 52 %
Platelet Count: 190 10*3/uL (ref 150–400)
RBC: 5.4 MIL/uL (ref 4.22–5.81)
RDW: 15.1 % (ref 11.5–15.5)
WBC Count: 7.9 10*3/uL (ref 4.0–10.5)
nRBC: 0 % (ref 0.0–0.2)

## 2023-04-24 MED ORDER — SODIUM CHLORIDE 0.9 % IV SOLN
10.0000 mg | Freq: Once | INTRAVENOUS | Status: AC
  Administered 2023-04-24: 10 mg via INTRAVENOUS
  Filled 2023-04-24: qty 10

## 2023-04-24 MED ORDER — FAMOTIDINE IN NACL 20-0.9 MG/50ML-% IV SOLN
20.0000 mg | Freq: Once | INTRAVENOUS | Status: AC
  Administered 2023-04-24: 20 mg via INTRAVENOUS
  Filled 2023-04-24: qty 50

## 2023-04-24 MED ORDER — MONTELUKAST SODIUM 10 MG PO TABS
10.0000 mg | ORAL_TABLET | Freq: Once | ORAL | Status: AC
  Administered 2023-04-24: 10 mg via ORAL
  Filled 2023-04-24: qty 1

## 2023-04-24 MED ORDER — DIPHENHYDRAMINE HCL 25 MG PO CAPS
50.0000 mg | ORAL_CAPSULE | Freq: Once | ORAL | Status: AC
  Administered 2023-04-24: 50 mg via ORAL
  Filled 2023-04-24: qty 2

## 2023-04-24 MED ORDER — ACETAMINOPHEN 325 MG PO TABS
650.0000 mg | ORAL_TABLET | Freq: Once | ORAL | Status: AC
  Administered 2023-04-24: 650 mg via ORAL
  Filled 2023-04-24: qty 2

## 2023-04-24 MED ORDER — SODIUM CHLORIDE 0.9 % IV SOLN: Freq: Once | INTRAVENOUS | Status: AC

## 2023-04-24 MED ORDER — SODIUM CHLORIDE 0.9 % IV SOLN
16.0000 mg/kg | Freq: Once | INTRAVENOUS | Status: AC
  Administered 2023-04-24: 1100 mg via INTRAVENOUS
  Filled 2023-04-24: qty 15

## 2023-04-24 NOTE — Progress Notes (Signed)
Patient seen by Dr. Kale  Vitals are within treatment parameters.  Labs reviewed: and are within treatment parameters.  Per physician team, patient is ready for treatment and there are NO modifications to the treatment plan.  

## 2023-04-24 NOTE — Patient Instructions (Signed)
[facility]  Discharge Instructions: Thank you for choosing [facility] to provide your oncology and hematology care.   If you have a lab appointment with the Cancer Center, please go directly to the Cancer Center and check in at the registration area.   Wear comfortable clothing and clothing appropriate for easy access to any Portacath or PICC line.   We strive to give you quality time with your provider. You may need to reschedule your appointment if you arrive late (15 or more minutes).  Arriving late affects you and other patients whose appointments are after yours.  Also, if you miss three or more appointments without notifying the office, you may be dismissed from the clinic at the provider's discretion.      For prescription refill requests, have your pharmacy contact our office and allow 72 hours for refills to be completed.    Today you received the following chemotherapy and/or immunotherapy agents: Daratumumab.       To help prevent nausea and vomiting after your treatment, we encourage you to take your nausea medication as directed.  BELOW ARE SYMPTOMS THAT SHOULD BE REPORTED IMMEDIATELY: *FEVER GREATER THAN 100.4 F (38 C) OR HIGHER *CHILLS OR SWEATING *NAUSEA AND VOMITING THAT IS NOT CONTROLLED WITH YOUR NAUSEA MEDICATION *UNUSUAL SHORTNESS OF BREATH *UNUSUAL BRUISING OR BLEEDING *URINARY PROBLEMS (pain or burning when urinating, or frequent urination) *BOWEL PROBLEMS (unusual diarrhea, constipation, pain near the anus) TENDERNESS IN MOUTH AND THROAT WITH OR WITHOUT PRESENCE OF ULCERS (sore throat, sores in mouth, or a toothache) UNUSUAL RASH, SWELLING OR PAIN  UNUSUAL VAGINAL DISCHARGE OR ITCHING   Items with * indicate a potential emergency and should be followed up as soon as possible or go to the Emergency Department if any problems should occur.  Please show the CHEMOTHERAPY ALERT CARD or IMMUNOTHERAPY ALERT CARD at  check-in to the Emergency Department and triage nurse.  Should you have questions after your visit or need to cancel or reschedule your appointment, please contact [facility]  Dept: [phone]  and follow the prompts.  Office hours are 8:00 a.m. to 4:30 p.m. Monday - Friday. Please note that voicemails left after 4:00 p.m. may not be returned until the following business day.  We are closed weekends and major holidays. You have access to a nurse at all times for urgent questions. Please call the main number to the clinic Dept: [phone] and follow the prompts.   For any non-urgent questions, you may also contact your provider using MyChart. We now offer e-Visits for anyone 18 and older to request care online for non-urgent symptoms. For details visit [email or web address].   Also download the MyChart app! Go to the app store, search "MyChart", open the app, select , and log in with your MyChart username and password.   

## 2023-04-29 ENCOUNTER — Other Ambulatory Visit: Payer: Self-pay | Admitting: Hematology

## 2023-04-29 DIAGNOSIS — C9000 Multiple myeloma not having achieved remission: Secondary | ICD-10-CM

## 2023-04-30 ENCOUNTER — Encounter: Payer: Self-pay | Admitting: Hematology

## 2023-04-30 ENCOUNTER — Other Ambulatory Visit: Payer: Self-pay

## 2023-05-07 MED FILL — Dexamethasone Sodium Phosphate Inj 100 MG/10ML: INTRAMUSCULAR | Qty: 1 | Status: AC

## 2023-05-08 ENCOUNTER — Inpatient Hospital Stay: Payer: BC Managed Care – PPO | Attending: Hematology

## 2023-05-08 ENCOUNTER — Other Ambulatory Visit: Payer: Self-pay

## 2023-05-08 ENCOUNTER — Inpatient Hospital Stay: Payer: BC Managed Care – PPO

## 2023-05-08 VITALS — BP 137/79 | HR 76 | Temp 97.9°F | Resp 17 | Wt 176.2 lb

## 2023-05-08 DIAGNOSIS — Z7189 Other specified counseling: Secondary | ICD-10-CM

## 2023-05-08 DIAGNOSIS — C9000 Multiple myeloma not having achieved remission: Secondary | ICD-10-CM | POA: Insufficient documentation

## 2023-05-08 DIAGNOSIS — C9001 Multiple myeloma in remission: Secondary | ICD-10-CM

## 2023-05-08 DIAGNOSIS — Z5112 Encounter for antineoplastic immunotherapy: Secondary | ICD-10-CM | POA: Insufficient documentation

## 2023-05-08 LAB — CBC WITH DIFFERENTIAL (CANCER CENTER ONLY)
Abs Immature Granulocytes: 0.03 10*3/uL (ref 0.00–0.07)
Basophils Absolute: 0.1 10*3/uL (ref 0.0–0.1)
Basophils Relative: 1 %
Eosinophils Absolute: 0.4 10*3/uL (ref 0.0–0.5)
Eosinophils Relative: 5 %
HCT: 45.5 % (ref 39.0–52.0)
Hemoglobin: 15 g/dL (ref 13.0–17.0)
Immature Granulocytes: 0 %
Lymphocytes Relative: 22 %
Lymphs Abs: 2.1 10*3/uL (ref 0.7–4.0)
MCH: 26 pg (ref 26.0–34.0)
MCHC: 33 g/dL (ref 30.0–36.0)
MCV: 79 fL — ABNORMAL LOW (ref 80.0–100.0)
Monocytes Absolute: 0.8 10*3/uL (ref 0.1–1.0)
Monocytes Relative: 9 %
Neutro Abs: 5.7 10*3/uL (ref 1.7–7.7)
Neutrophils Relative %: 63 %
Platelet Count: 178 10*3/uL (ref 150–400)
RBC: 5.76 MIL/uL (ref 4.22–5.81)
RDW: 15.4 % (ref 11.5–15.5)
WBC Count: 9.2 10*3/uL (ref 4.0–10.5)
nRBC: 0 % (ref 0.0–0.2)

## 2023-05-08 LAB — COMPREHENSIVE METABOLIC PANEL
ALT: 25 U/L (ref 0–44)
AST: 16 U/L (ref 15–41)
Albumin: 4.4 g/dL (ref 3.5–5.0)
Alkaline Phosphatase: 50 U/L (ref 38–126)
Anion gap: 8 (ref 5–15)
BUN: 11 mg/dL (ref 6–20)
CO2: 26 mmol/L (ref 22–32)
Calcium: 9 mg/dL (ref 8.9–10.3)
Chloride: 103 mmol/L (ref 98–111)
Creatinine, Ser: 0.73 mg/dL (ref 0.61–1.24)
GFR, Estimated: >60 mL/min (ref >60)
Glucose, Bld: 201 mg/dL — ABNORMAL HIGH (ref 70–99)
Potassium: 4.1 mmol/L (ref 3.5–5.1)
Sodium: 137 mmol/L (ref 135–145)
Total Bilirubin: 0.7 mg/dL (ref 0.3–1.2)
Total Protein: 7 g/dL (ref 6.5–8.1)

## 2023-05-08 MED ORDER — ZOLEDRONIC ACID 4 MG/100ML IV SOLN
4.0000 mg | Freq: Once | INTRAVENOUS | Status: AC
  Administered 2023-05-08: 4 mg via INTRAVENOUS
  Filled 2023-05-08: qty 100

## 2023-05-08 MED ORDER — SODIUM CHLORIDE 0.9 % IV SOLN
16.0000 mg/kg | Freq: Once | INTRAVENOUS | Status: AC
  Administered 2023-05-08: 1100 mg via INTRAVENOUS
  Filled 2023-05-08: qty 15

## 2023-05-08 MED ORDER — MONTELUKAST SODIUM 10 MG PO TABS
10.0000 mg | ORAL_TABLET | Freq: Once | ORAL | Status: AC
  Administered 2023-05-08: 10 mg via ORAL
  Filled 2023-05-08: qty 1

## 2023-05-08 MED ORDER — DIPHENHYDRAMINE HCL 25 MG PO CAPS
50.0000 mg | ORAL_CAPSULE | Freq: Once | ORAL | Status: AC
  Administered 2023-05-08: 50 mg via ORAL
  Filled 2023-05-08: qty 2

## 2023-05-08 MED ORDER — SODIUM CHLORIDE 0.9 % IV SOLN: Freq: Once | INTRAVENOUS | Status: AC

## 2023-05-08 MED ORDER — FAMOTIDINE 20 MG IN NS 100 ML IVPB
20.0000 mg | Freq: Once | INTRAVENOUS | Status: AC
  Administered 2023-05-08: 20 mg via INTRAVENOUS
  Filled 2023-05-08: qty 100

## 2023-05-08 MED ORDER — ACETAMINOPHEN 325 MG PO TABS
650.0000 mg | ORAL_TABLET | Freq: Once | ORAL | Status: AC
  Administered 2023-05-08: 650 mg via ORAL
  Filled 2023-05-08: qty 2

## 2023-05-08 MED ORDER — SODIUM CHLORIDE 0.9 % IV SOLN
10.0000 mg | Freq: Once | INTRAVENOUS | Status: AC
  Administered 2023-05-08: 10 mg via INTRAVENOUS
  Filled 2023-05-08: qty 10

## 2023-05-08 NOTE — Patient Instructions (Signed)
[facility]  Discharge Instructions: Thank you for choosing [facility] to provide your oncology and hematology care.   If you have a lab appointment with the Cancer Center, please go directly to the Cancer Center and check in at the registration area.   Wear comfortable clothing and clothing appropriate for easy access to any Portacath or PICC line.   We strive to give you quality time with your provider. You may need to reschedule your appointment if you arrive late (15 or more minutes).  Arriving late affects you and other patients whose appointments are after yours.  Also, if you miss three or more appointments without notifying the office, you may be dismissed from the clinic at the provider's discretion.      For prescription refill requests, have your pharmacy contact our office and allow 72 hours for refills to be completed.    Today you received the following chemotherapy and/or immunotherapy agents : Daratumumab      To help prevent nausea and vomiting after your treatment, we encourage you to take your nausea medication as directed.  BELOW ARE SYMPTOMS THAT SHOULD BE REPORTED IMMEDIATELY: *FEVER GREATER THAN 100.4 F (38 C) OR HIGHER *CHILLS OR SWEATING *NAUSEA AND VOMITING THAT IS NOT CONTROLLED WITH YOUR NAUSEA MEDICATION *UNUSUAL SHORTNESS OF BREATH *UNUSUAL BRUISING OR BLEEDING *URINARY PROBLEMS (pain or burning when urinating, or frequent urination) *BOWEL PROBLEMS (unusual diarrhea, constipation, pain near the anus) TENDERNESS IN MOUTH AND THROAT WITH OR WITHOUT PRESENCE OF ULCERS (sore throat, sores in mouth, or a toothache) UNUSUAL RASH, SWELLING OR PAIN  UNUSUAL VAGINAL DISCHARGE OR ITCHING   Items with * indicate a potential emergency and should be followed up as soon as possible or go to the Emergency Department if any problems should occur.  Please show the CHEMOTHERAPY ALERT CARD or IMMUNOTHERAPY ALERT CARD at  check-in to the Emergency Department and triage nurse.  Should you have questions after your visit or need to cancel or reschedule your appointment, please contact [facility]  Dept: [phone]  and follow the prompts.  Office hours are 8:00 a.m. to 4:30 p.m. Monday - Friday. Please note that voicemails left after 4:00 p.m. may not be returned until the following business day.  We are closed weekends and major holidays. You have access to a nurse at all times for urgent questions. Please call the main number to the clinic Dept: [phone] and follow the prompts.   For any non-urgent questions, you may also contact your provider using MyChart. We now offer e-Visits for anyone 57 and older to request care online for non-urgent symptoms. For details visit [email or web address].   Also download the MyChart app! Go to the app store, search "MyChart", open the app, select [place], and log in with your MyChart username and password.  Zoledronic Acid Injection (Cancer) What is this medication? ZOLEDRONIC ACID (ZOE le dron ik AS id) treats high calcium levels in the blood caused by cancer. It may also be used with chemotherapy to treat weakened bones caused by cancer. It works by slowing down the release of calcium from bones. This lowers calcium levels in your blood. It also makes your bones stronger and less likely to break (fracture). It belongs to a group of medications called bisphosphonates. This medicine may be used for other purposes; ask your health care provider or pharmacist if you have questions. COMMON BRAND NAME(S): Zometa, Zometa Powder What  should I tell my care team before I take this medication? They need to know if you have any of these conditions: Dehydration Dental disease Kidney disease Liver disease Low levels of calcium in the blood Lung or breathing disease, such as asthma Receiving steroids, such as dexamethasone or prednisone An  unusual or allergic reaction to zoledronic acid, other medications, foods, dyes, or preservatives Pregnant or trying to get pregnant Breast-feeding How should I use this medication? This medication is injected into a vein. It is given by your care team in a hospital or clinic setting. Talk to your care team about the use of this medication in children. Special care may be needed. Overdosage: If you think you have taken too much of this medicine contact a poison control center or emergency room at once. NOTE: This medicine is only for you. Do not share this medicine with others. What if I miss a dose? Keep appointments for follow-up doses. It is important not to miss your dose. Call your care team if you are unable to keep an appointment. What may interact with this medication? Certain antibiotics given by injection Diuretics, such as bumetanide, furosemide NSAIDs, medications for pain and inflammation, such as ibuprofen or naproxen Teriparatide Thalidomide This list may not describe all possible interactions. Give your health care provider a list of all the medicines, herbs, non-prescription drugs, or dietary supplements you use. Also tell them if you smoke, drink alcohol, or use illegal drugs. Some items may interact with your medicine. What should I watch for while using this medication? Visit your care team for regular checks on your progress. It may be some time before you see the benefit from this medication. Some people who take this medication have severe bone, joint, or muscle pain. This medication may also increase your risk for jaw problems or a broken thigh bone. Tell your care team right away if you have severe pain in your jaw, bones, joints, or muscles. Tell you care team if you have any pain that does not go away or that gets worse. Tell your dentist and dental surgeon that you are taking this medication. You should not have major dental surgery while on this medication. See your  dentist to have a dental exam and fix any dental problems before starting this medication. Take good care of your teeth while on this medication. Make sure you see your dentist for regular follow-up appointments. You should make sure you get enough calcium and vitamin D while you are taking this medication. Discuss the foods you eat and the vitamins you take with your care team. Check with your care team if you have severe diarrhea, nausea, and vomiting, or if you sweat a lot. The loss of too much body fluid may make it dangerous for you to take this medication. You may need bloodwork while taking this medication. Talk to your care team if you wish to become pregnant or think you might be pregnant. This medication can cause serious birth defects. What side effects may I notice from receiving this medication? Side effects that you should report to your care team as soon as possible: Allergic reactions--skin rash, itching, hives, swelling of the face, lips, tongue, or throat Kidney injury--decrease in the amount of urine, swelling of the ankles, hands, or feet Low calcium level--muscle pain or cramps, confusion, tingling, or numbness in the hands or feet Osteonecrosis of the jaw--pain, swelling, or redness in the mouth, numbness of the jaw, poor healing after dental work, unusual   discharge from the mouth, visible bones in the mouth Severe bone, joint, or muscle pain Side effects that usually do not require medical attention (report to your care team if they continue or are bothersome): Constipation Fatigue Fever Loss of appetite Nausea Stomach pain This list may not describe all possible side effects. Call your doctor for medical advice about side effects. You may report side effects to FDA at 1-800-FDA-1088. Where should I keep my medication? This medication is given in a hospital or clinic. It will not be stored at home. NOTE: This sheet is a summary. It may not cover all possible information.  If you have questions about this medicine, talk to your doctor, pharmacist, or health care provider.  2023 Elsevier/Gold Standard (2008-02-05 00:00:00)

## 2023-05-18 LAB — MULTIPLE MYELOMA PANEL, SERUM
B-Globulin SerPl Elph-Mcnc: 0.9 g/dL (ref 0.7–1.3)
Gamma Glob SerPl Elph-Mcnc: 0.6 g/dL (ref 0.4–1.8)

## 2023-05-19 LAB — MULTIPLE MYELOMA PANEL, SERUM
Albumin SerPl Elph-Mcnc: 3.7 g/dL (ref 2.9–4.4)
Albumin/Glob SerPl: 1.4 (ref 0.7–1.7)
Alpha 1: 0.4 g/dL (ref 0.0–0.4)
Alpha2 Glob SerPl Elph-Mcnc: 0.8 g/dL (ref 0.4–1.0)
Globulin, Total: 2.8 g/dL (ref 2.2–3.9)
IgA: 103 mg/dL (ref 90–386)
IgG (Immunoglobin G), Serum: 640 mg/dL (ref 603–1613)
IgM (Immunoglobulin M), Srm: 25 mg/dL (ref 20–172)
Total Protein ELP: 6.5 g/dL (ref 6.0–8.5)

## 2023-05-22 ENCOUNTER — Ambulatory Visit: Payer: BC Managed Care – PPO

## 2023-05-22 ENCOUNTER — Other Ambulatory Visit: Payer: BC Managed Care – PPO

## 2023-05-22 ENCOUNTER — Ambulatory Visit: Payer: BC Managed Care – PPO | Admitting: Physician Assistant

## 2023-05-27 ENCOUNTER — Other Ambulatory Visit: Payer: Self-pay | Admitting: Hematology

## 2023-05-27 DIAGNOSIS — C9000 Multiple myeloma not having achieved remission: Secondary | ICD-10-CM

## 2023-06-04 MED FILL — Dexamethasone Sodium Phosphate Inj 100 MG/10ML: INTRAMUSCULAR | Qty: 1 | Status: AC

## 2023-06-05 ENCOUNTER — Inpatient Hospital Stay: Payer: BC Managed Care – PPO | Attending: Hematology

## 2023-06-05 ENCOUNTER — Ambulatory Visit: Payer: BC Managed Care – PPO

## 2023-06-05 ENCOUNTER — Inpatient Hospital Stay: Payer: BC Managed Care – PPO | Admitting: Hematology

## 2023-06-05 ENCOUNTER — Inpatient Hospital Stay: Payer: BC Managed Care – PPO

## 2023-06-05 ENCOUNTER — Other Ambulatory Visit: Payer: Self-pay

## 2023-06-05 ENCOUNTER — Other Ambulatory Visit: Payer: BC Managed Care – PPO

## 2023-06-05 VITALS — BP 127/76 | HR 67 | Temp 98.6°F | Resp 16

## 2023-06-05 VITALS — BP 149/83 | HR 77 | Temp 97.7°F | Resp 20 | Wt 176.3 lb

## 2023-06-05 DIAGNOSIS — C9000 Multiple myeloma not having achieved remission: Secondary | ICD-10-CM | POA: Insufficient documentation

## 2023-06-05 DIAGNOSIS — C9001 Multiple myeloma in remission: Secondary | ICD-10-CM

## 2023-06-05 DIAGNOSIS — Z7189 Other specified counseling: Secondary | ICD-10-CM

## 2023-06-05 DIAGNOSIS — E1165 Type 2 diabetes mellitus with hyperglycemia: Secondary | ICD-10-CM | POA: Insufficient documentation

## 2023-06-05 DIAGNOSIS — Z5112 Encounter for antineoplastic immunotherapy: Secondary | ICD-10-CM | POA: Insufficient documentation

## 2023-06-05 LAB — CBC WITH DIFFERENTIAL (CANCER CENTER ONLY)
Abs Immature Granulocytes: 0.02 10*3/uL (ref 0.00–0.07)
Basophils Absolute: 0.1 10*3/uL (ref 0.0–0.1)
Basophils Relative: 2 %
Eosinophils Absolute: 0.3 10*3/uL (ref 0.0–0.5)
Eosinophils Relative: 4 %
HCT: 43.2 % (ref 39.0–52.0)
Hemoglobin: 14.7 g/dL (ref 13.0–17.0)
Immature Granulocytes: 0 %
Lymphocytes Relative: 35 %
Lymphs Abs: 2.4 10*3/uL (ref 0.7–4.0)
MCH: 26.3 pg (ref 26.0–34.0)
MCHC: 34 g/dL (ref 30.0–36.0)
MCV: 77.4 fL — ABNORMAL LOW (ref 80.0–100.0)
Monocytes Absolute: 0.6 10*3/uL (ref 0.1–1.0)
Monocytes Relative: 9 %
Neutro Abs: 3.4 10*3/uL (ref 1.7–7.7)
Neutrophils Relative %: 50 %
Platelet Count: 199 10*3/uL (ref 150–400)
RBC: 5.58 MIL/uL (ref 4.22–5.81)
RDW: 15.3 % (ref 11.5–15.5)
WBC Count: 6.7 10*3/uL (ref 4.0–10.5)
nRBC: 0 % (ref 0.0–0.2)

## 2023-06-05 LAB — COMPREHENSIVE METABOLIC PANEL
ALT: 20 U/L (ref 0–44)
AST: 14 U/L — ABNORMAL LOW (ref 15–41)
Albumin: 4.1 g/dL (ref 3.5–5.0)
Alkaline Phosphatase: 57 U/L (ref 38–126)
Anion gap: 9 (ref 5–15)
BUN: 15 mg/dL (ref 6–20)
CO2: 26 mmol/L (ref 22–32)
Calcium: 9.2 mg/dL (ref 8.9–10.3)
Chloride: 102 mmol/L (ref 98–111)
Creatinine, Ser: 0.84 mg/dL (ref 0.61–1.24)
GFR, Estimated: >60 mL/min (ref >60)
Glucose, Bld: 254 mg/dL — ABNORMAL HIGH (ref 70–99)
Potassium: 3.4 mmol/L — ABNORMAL LOW (ref 3.5–5.1)
Sodium: 137 mmol/L (ref 135–145)
Total Bilirubin: 0.5 mg/dL (ref 0.3–1.2)
Total Protein: 6.8 g/dL (ref 6.5–8.1)

## 2023-06-05 MED ORDER — ZOLEDRONIC ACID 4 MG/100ML IV SOLN
4.0000 mg | Freq: Once | INTRAVENOUS | Status: AC
  Administered 2023-06-05: 4 mg via INTRAVENOUS
  Filled 2023-06-05: qty 100

## 2023-06-05 MED ORDER — SODIUM CHLORIDE 0.9 % IV SOLN
16.0000 mg/kg | Freq: Once | INTRAVENOUS | Status: AC
  Administered 2023-06-05: 1100 mg via INTRAVENOUS
  Filled 2023-06-05: qty 15

## 2023-06-05 MED ORDER — FAMOTIDINE IN NACL 20-0.9 MG/50ML-% IV SOLN
20.0000 mg | Freq: Once | INTRAVENOUS | Status: AC
  Administered 2023-06-05: 20 mg via INTRAVENOUS
  Filled 2023-06-05: qty 50

## 2023-06-05 MED ORDER — ACETAMINOPHEN 325 MG PO TABS
650.0000 mg | ORAL_TABLET | Freq: Once | ORAL | Status: AC
  Administered 2023-06-05: 650 mg via ORAL
  Filled 2023-06-05: qty 2

## 2023-06-05 MED ORDER — DEXAMETHASONE SODIUM PHOSPHATE 10 MG/ML IJ SOLN
6.0000 mg | Freq: Once | INTRAMUSCULAR | Status: AC
  Administered 2023-06-05: 6 mg via INTRAVENOUS
  Filled 2023-06-05: qty 1

## 2023-06-05 MED ORDER — MONTELUKAST SODIUM 10 MG PO TABS
10.0000 mg | ORAL_TABLET | Freq: Once | ORAL | Status: AC
  Administered 2023-06-05: 10 mg via ORAL
  Filled 2023-06-05: qty 1

## 2023-06-05 MED ORDER — DIPHENHYDRAMINE HCL 25 MG PO CAPS
50.0000 mg | ORAL_CAPSULE | Freq: Once | ORAL | Status: AC
  Administered 2023-06-05: 50 mg via ORAL
  Filled 2023-06-05: qty 2

## 2023-06-05 MED ORDER — SODIUM CHLORIDE 0.9 % IV SOLN: Freq: Once | INTRAVENOUS | Status: AC

## 2023-06-05 NOTE — Progress Notes (Signed)
Patient seen by Dr. Kale  Vitals are within treatment parameters.  Labs reviewed: and are within treatment parameters.  Per physician team, patient is ready for treatment and there are NO modifications to the treatment plan.  

## 2023-06-05 NOTE — Progress Notes (Signed)
HEMATOLOGY/ONCOLOGY CLINIC NOTE  Date of Service: 06/05/23  Chief complaint - Follow-up for continued evaluation and management of high risk multiple myeloma  Current treatment-daratumumab/Revlimid/dexamethasone Has not f/u on and does not appear keen to consider Melphalan/HDT-AUTOHSCT  INTERVAL HISTORY:  Donald Suarez is a 57 y.o. male is here for continued evaluation and management of his multiple myeloma. Patient was last seen by me on 04/24/2023 and was doing well overall with no new medical concerns.  Today, he is scheduled to receive day 1 cycle 9 of treatment and is accompanied by an interpreter. He reports that he has been tolerating treatment well with no major toxicities. He denies any diarrhea, nausea, stomach upsets, infection issues, major medication changes  He continues to only take Metformin for diabetes mellitus management. His blood sugars have been elevated recently which he attributes to his diet.   Patient has normal eating and sleeping habits. He denies any new bone pains, headaches, dizziness, or leg swelling. He continues to stay hydrated regularly. Patient continues to work with landscaping regularly.   MEDICAL HISTORY:  Past Medical History:  Diagnosis Date   Anemia    Elevated ferritin 08/07/2022   Hypocalcemia 08/19/2022   In setting of multiple myeloma    Microalbuminuria 08/19/2022   Peripheral neuropathy 09/08/2022   Started 08/2022 A/w diabetes and revlimid usage Burning on top of left foot    SURGICAL HISTORY: Past Surgical History:  Procedure Laterality Date   APPENDECTOMY     [place] BONE MARROW BIOPSY & ASPIRATION  02/11/2023   [place] FLUORO GUIDE CV LINE RIGHT  05/15/2022   [place] PATIENT EVAL TECH 0-60 MINS  05/19/2022   [facility]ola US GUIDE VASC ACCESS RIGHT  05/15/2022    SOCIAL HISTORY: Social History   Socioeconomic History   Marital status: Married    Spouse name: Not on file   Number of children: Not on file   Years of education: Not on file   Highest  education level: Not on file  Occupational History   Not on file  Tobacco Use   Smoking status: Never   Smokeless tobacco: Never  Vaping Use   Vaping Use: Never used  Substance and Sexual Activity   Alcohol use: Never   Drug use: Never   Sexual activity: Not on file  Other Topics Concern   Not on file  Social History Narrative   Not on file   Social Determinants of Health   Financial Resource Strain: Medium Risk (05/21/2022)   Overall Financial Resource Strain (CARDIA)    Difficulty of Paying Living Expenses: Somewhat hard  Food Insecurity: Food Insecurity Present (05/21/2022)   Hunger Vital Sign    Worried About Running Out of Food in the Last Year: Sometimes true    Ran Out of Food in the Last Year: Sometimes true  Transportation Needs: No Transportation Needs (05/21/2022)   PRAPARE - Administrator, Civil Service (Medical): No    Lack of Transportation (Non-Medical): No  Physical Activity: Not on file  Stress: Not on file  Social Connections: Not on file  Intimate Partner Violence: Not on file    FAMILY HISTORY: No family history on file.  ALLERGIES:  has No Known Allergies.  MEDICATIONS:  . Allergies as of 06/05/2023   No Known Allergies      Medication List        Accurate as of June 05, 2023  7:49 AM. If you have any questions, ask your nurse or doctor.  Accu-Chek Guide test strip Generic drug: glucose blood Use to test blood sugars up to 4 times daily as directed.   Accu-Chek Guide w/Device Kit Use to test blood sugars up to 4 times daily as directed.   Accu-Chek Softclix Lancets lancets Use to test blood sugars up to 4 times daily as directed.   acyclovir 400 MG tablet Commonly known as: ZOVIRAX Take 1 tablet (400 mg total) by mouth 2 (two) times daily.   aspirin EC 81 MG tablet Take 1 tablet (81 mg total) by mouth daily. Swallow whole.   B-12 1000 MCG Caps Take 1 tablet by mouth daily.   FreeStyle Libre 3 Sensor  Misc 1 Application by Does not apply route every 14 (fourteen) days. Place 1 sensor on the skin every 14 days. Use to check glucose continuously   gabapentin 300 MG capsule Commonly known as: NEURONTIN Take 1 capsule (300 mg total) by mouth 3 (three) times daily.   lenalidomide 25 MG capsule Commonly known as: REVLIMID TAKE 1 CAPSULE BY MOUTH 1 TIME A DAY FOR 21 DAYS ON THEN 7 DAYS OFF   metFORMIN 500 MG tablet Commonly known as: GLUCOPHAGE Take 2 tablets (1,000 mg total) by mouth 2 (two) times daily with a meal. Note dose increased. Replaces 500 mg twice daily.   ondansetron 8 MG tablet Commonly known as: Zofran Take 1 tablet (8 mg total) by mouth every 8 (eight) hours as needed for nausea or vomiting.   potassium chloride SA 20 MEQ tablet Commonly known as: KLOR-CON M Take 1 tablet (20 mEq total) by mouth daily.   prochlorperazine 10 MG tablet Commonly known as: COMPAZINE Take 1 tablet (10 mg total) by mouth every 6 (six) hours as needed for nausea or vomiting.         REVIEW OF SYSTEMS:    10 Point review of Systems was done is negative except as noted above.   PHYSICAL EXAMINATION: .There were no vitals taken for this visit.   GENERAL:alert, in no acute distress and comfortable SKIN: no acute rashes, no significant lesions EYES: conjunctiva are pink and non-injected, sclera anicteric OROPHARYNX: MMM, no exudates, no oropharyngeal erythema or ulceration NECK: supple, no JVD LYMPH:  no palpable lymphadenopathy in the cervical, axillary or inguinal regions LUNGS: clear to auscultation b/l with normal respiratory effort HEART: regular rate & rhythm ABDOMEN:  normoactive bowel sounds , non tender, not distended. Extremity: no pedal edema PSYCH: alert & oriented x 3 with fluent speech NEURO: no focal motor/sensory deficits     LABORATORY DATA:  I have reviewed the data as listed  .    Latest Ref Rng & Units 05/08/2023    9:15 AM 04/24/2023    9:45 AM 04/10/2023    12:14 PM  CBC  WBC 4.0 - 10.5 K/uL 9.2  7.9  7.9   Hemoglobin 13.0 - 17.0 g/dL 40.9  81.1  91.4   Hematocrit 39.0 - 52.0 % 45.5  42.2  42.9   Platelets 150 - 400 K/uL 178  190  200     .    Latest Ref Rng & Units 05/08/2023    9:15 AM 04/24/2023    9:45 AM 04/10/2023   12:14 PM  CMP  Glucose 70 - 99 mg/dL 782  956  213   BUN 6 - 20 mg/dL 11  13  12    Creatinine 0.61 - 1.24 mg/dL 0.86  5.78  4.69   Sodium 135 - 145 mmol/L 137  136  135   Potassium 3.5 - 5.1 mmol/L 4.1  3.5  3.5   Chloride 98 - 111 mmol/L 103  104  103   CO2 22 - 32 mmol/L 26  25  24    Calcium 8.9 - 10.3 mg/dL 9.0  9.2  8.8   Total Protein 6.5 - 8.1 g/dL 7.0  6.1  6.5   Total Bilirubin 0.3 - 1.2 mg/dL 0.7  0.5  0.6   Alkaline Phos 38 - 126 U/L 50  44  47   AST 15 - 41 U/L 16  13  14    ALT 0 - 44 U/L 25  16  25     . Lab Results  Component Value Date   LDH 114 10/10/2022    Bone marrow biopsy 02/11/2023   RADIOGRAPHIC STUDIES: I have personally reviewed the radiological images as listed and agreed with the findings in the report. No results found.  ASSESSMENT & PLAN:   1) R-ISS stage III high risk IgG Lambda Multiple myeloma not on treatment for more than 1 year due to patient's lack of follow-up.  Patient diagnosed December 2021 and received 1 cycle of CyBorD. Had previously presented with anemia renal insufficiency and hyperviscosity symptoms. Bx- 90% plasma cells Initial M spike 8.03g/dl ZOX(0R): Not Detected  Dup(1q): Not Detected  Gains(15): DETECTED  Gains(5 and 9): Not Detected  Del(13q)/-13: DETECTED  Del(17p)(TP53): Not Detected  IGH(Rearrangement): SEE BELOW   IgH complex: t(4;14): DETECTED  t(11;14): Not Detected  t(14;16): Not Detected  t(14;20): Not Detected   Cytogenetics: Normal male karyotype.   -Labs from 05/13/2022-beta-2 microglobulin 4.4, LDH 197, lambda free light chain 77.1, kappa, lambda light chain ratio 0.08, multiple myeloma panel pending.  UPEP ordered but not yet  collected. 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  Due to multiple myeloma   PLAN  -Discussed lab results on 06/05/2023 in detail with patient. CBC normal, showed WBC of 6.7K, hemoglobin of 14.7, and platelets of 199K. -CMP stable, does show hyperglycemia -Continue maintenance monthly 15 mg Revlimid  po 3 weeks on 1 week off and Daratumumab for high risk multiple myeloma -last myeloma panel showed undetectable M spike, patient continues to be in remission -will minimize Dexamethasone from 10 MG to 6 MG -Advised patient to drink at least 64oz of water daily to optimize hydration -patient has a scheduled visit with his PCP next week. Advised patient to discuss with PCP to consider adjusting blood sugar medications -continue monthly Zometa, may switch to maintenance Zometa after 1 year  Follow-up -continue monthly Daratumumab and Zometa per integrated scheduling  The total time spent in the appointment was 30 minutes* .  All of the patient's questions were answered with apparent satisfaction. The patient knows to call the clinic with any problems, questions or concerns.   Wyvonnia Lora MD MS AAHIVMS [facility] Hematology/Oncology Physician [facility]  .*Total Encounter Time as defined by the Centers for Medicare and Medicaid Services includes, in addition to the face-to-face time of a patient visit (documented in the note above) non-face-to-face time: obtaining  and reviewing outside  history, ordering and reviewing medications, tests or procedures, care coordination (communications with other health care professionals or caregivers) and documentation in the medical record.    I,Mitra Faeizi,acting as a Neurosurgeon for Wyvonnia Lora, MD.,have documented all relevant documentation on the behalf of Wyvonnia Lora, MD,as directed by  Wyvonnia Lora, MD while in the presence of Wyvonnia Lora, MD.  .I have reviewed the above documentation for accuracy and completeness, and I agree with the above. Johney Maine MD

## 2023-06-05 NOTE — Patient Instructions (Signed)
[facility]  Discharge Instructions: Thank you for choosing [facility] to provide your oncology and hematology care.   If you have a lab appointment with the Cancer Center, please go directly to the Cancer Center and check in at the registration area.   Wear comfortable clothing and clothing appropriate for easy access to any Portacath or PICC line.   We strive to give you quality time with your provider. You may need to reschedule your appointment if you arrive late (15 or more minutes).  Arriving late affects you and other patients whose appointments are after yours.  Also, if you miss three or more appointments without notifying the office, you may be dismissed from the clinic at the provider's discretion.      For prescription refill requests, have your pharmacy contact our office and allow 72 hours for refills to be completed.    Today you received the following chemotherapy and/or immunotherapy agents : Daratumumab      To help prevent nausea and vomiting after your treatment, we encourage you to take your nausea medication as directed.  BELOW ARE SYMPTOMS THAT SHOULD BE REPORTED IMMEDIATELY: *FEVER GREATER THAN 100.4 F (38 C) OR HIGHER *CHILLS OR SWEATING *NAUSEA AND VOMITING THAT IS NOT CONTROLLED WITH YOUR NAUSEA MEDICATION *UNUSUAL SHORTNESS OF BREATH *UNUSUAL BRUISING OR BLEEDING *URINARY PROBLEMS (pain or burning when urinating, or frequent urination) *BOWEL PROBLEMS (unusual diarrhea, constipation, pain near the anus) TENDERNESS IN MOUTH AND THROAT WITH OR WITHOUT PRESENCE OF ULCERS (sore throat, sores in mouth, or a toothache) UNUSUAL RASH, SWELLING OR PAIN  UNUSUAL VAGINAL DISCHARGE OR ITCHING   Items with * indicate a potential emergency and should be followed up as soon as possible or go to the Emergency Department if any problems should occur.  Please show the CHEMOTHERAPY ALERT CARD or IMMUNOTHERAPY ALERT CARD at  check-in to the Emergency Department and triage nurse.  Should you have questions after your visit or need to cancel or reschedule your appointment, please contact [facility]  Dept: [phone]  and follow the prompts.  Office hours are 8:00 a.m. to 4:30 p.m. Monday - Friday. Please note that voicemails left after 4:00 p.m. may not be returned until the following business day.  We are closed weekends and major holidays. You have access to a nurse at all times for urgent questions. Please call the main number to the clinic Dept: [phone] and follow the prompts.   For any non-urgent questions, you may also contact your provider using MyChart. We now offer e-Visits for anyone 18 and older to request care online for non-urgent symptoms. For details visit [email or web address].   Also download the MyChart app! Go to the app store, search "MyChart", open the app, select [facility], and log in with your MyChart username and password.  Zoledronic Acid Injection (Cancer) What is this medication? ZOLEDRONIC ACID (ZOE le dron ik AS id) treats high calcium levels in the blood caused by cancer. It may also be used with chemotherapy to treat weakened bones caused by cancer. It works by slowing down the release of calcium from bones. This lowers calcium levels in your blood. It also makes your bones stronger and less likely to break (fracture). It belongs to a group of medications called bisphosphonates. This medicine may be used for other purposes; ask your health care provider or pharmacist if you have questions. COMMON BRAND NAME(S): Zometa, Zometa Powder What   should I tell my care team before I take this medication? They need to know if you have any of these conditions: Dehydration Dental disease Kidney disease Liver disease Low levels of calcium in the blood Lung or breathing disease, such as asthma Receiving steroids, such as dexamethasone or prednisone An  unusual or allergic reaction to zoledronic acid, other medications, foods, dyes, or preservatives Pregnant or trying to get pregnant Breast-feeding How should I use this medication? This medication is injected into a vein. It is given by your care team in a hospital or clinic setting. Talk to your care team about the use of this medication in children. Special care may be needed. Overdosage: If you think you have taken too much of this medicine contact a poison control center or emergency room at once. NOTE: This medicine is only for you. Do not share this medicine with others. What if I miss a dose? Keep appointments for follow-up doses. It is important not to miss your dose. Call your care team if you are unable to keep an appointment. What may interact with this medication? Certain antibiotics given by injection Diuretics, such as bumetanide, furosemide NSAIDs, medications for pain and inflammation, such as ibuprofen or naproxen Teriparatide Thalidomide This list may not describe all possible interactions. Give your health care provider a list of all the medicines, herbs, non-prescription drugs, or dietary supplements you use. Also tell them if you smoke, drink alcohol, or use illegal drugs. Some items may interact with your medicine. What should I watch for while using this medication? Visit your care team for regular checks on your progress. It may be some time before you see the benefit from this medication. Some people who take this medication have severe bone, joint, or muscle pain. This medication may also increase your risk for jaw problems or a broken thigh bone. Tell your care team right away if you have severe pain in your jaw, bones, joints, or muscles. Tell you care team if you have any pain that does not go away or that gets worse. Tell your dentist and dental surgeon that you are taking this medication. You should not have major dental surgery while on this medication. See your  dentist to have a dental exam and fix any dental problems before starting this medication. Take good care of your teeth while on this medication. Make sure you see your dentist for regular follow-up appointments. You should make sure you get enough calcium and vitamin D while you are taking this medication. Discuss the foods you eat and the vitamins you take with your care team. Check with your care team if you have severe diarrhea, nausea, and vomiting, or if you sweat a lot. The loss of too much body fluid may make it dangerous for you to take this medication. You may need bloodwork while taking this medication. Talk to your care team if you wish to become pregnant or think you might be pregnant. This medication can cause serious birth defects. What side effects may I notice from receiving this medication? Side effects that you should report to your care team as soon as possible: Allergic reactions--skin rash, itching, hives, swelling of the face, lips, tongue, or throat Kidney injury--decrease in the amount of urine, swelling of the ankles, hands, or feet Low calcium level--muscle pain or cramps, confusion, tingling, or numbness in the hands or feet Osteonecrosis of the jaw--pain, swelling, or redness in the mouth, numbness of the jaw, poor healing after dental work, unusual   discharge from the mouth, visible bones in the mouth Severe bone, joint, or muscle pain Side effects that usually do not require medical attention (report to your care team if they continue or are bothersome): Constipation Fatigue Fever Loss of appetite Nausea Stomach pain This list may not describe all possible side effects. Call your doctor for medical advice about side effects. You may report side effects to FDA at 1-800-FDA-1088. Where should I keep my medication? This medication is given in a hospital or clinic. It will not be stored at home. NOTE: This sheet is a summary. It may not cover all possible information.  If you have questions about this medicine, talk to your doctor, pharmacist, or health care provider.  2023 [employer] Standard (2008-02-05 00:00:00)  

## 2023-06-06 ENCOUNTER — Other Ambulatory Visit: Payer: Self-pay

## 2023-06-09 ENCOUNTER — Other Ambulatory Visit: Payer: BC Managed Care – PPO

## 2023-06-09 ENCOUNTER — Other Ambulatory Visit: Payer: Self-pay

## 2023-06-09 ENCOUNTER — Ambulatory Visit: Payer: BC Managed Care – PPO | Admitting: Internal Medicine

## 2023-06-09 ENCOUNTER — Encounter: Payer: Self-pay | Admitting: Internal Medicine

## 2023-06-09 VITALS — BP 132/85 | HR 70 | Temp 98.1°F | Ht 66.0 in | Wt 172.0 lb

## 2023-06-09 DIAGNOSIS — E1165 Type 2 diabetes mellitus with hyperglycemia: Secondary | ICD-10-CM

## 2023-06-09 DIAGNOSIS — Z794 Long term (current) use of insulin: Secondary | ICD-10-CM

## 2023-06-09 DIAGNOSIS — Z603 Acculturation difficulty: Secondary | ICD-10-CM

## 2023-06-09 DIAGNOSIS — Z758 Other problems related to medical facilities and other health care: Secondary | ICD-10-CM

## 2023-06-09 DIAGNOSIS — H538 Other visual disturbances: Secondary | ICD-10-CM | POA: Diagnosis not present

## 2023-06-09 DIAGNOSIS — Z1211 Encounter for screening for malignant neoplasm of colon: Secondary | ICD-10-CM

## 2023-06-09 DIAGNOSIS — Z8719 Personal history of other diseases of the digestive system: Secondary | ICD-10-CM | POA: Diagnosis not present

## 2023-06-09 LAB — COMPREHENSIVE METABOLIC PANEL
ALT: 19 U/L (ref 0–53)
AST: 13 U/L (ref 0–37)
Albumin: 4.3 g/dL (ref 3.5–5.2)
Alkaline Phosphatase: 56 U/L (ref 39–117)
BUN: 15 mg/dL (ref 6–23)
CO2: 29 mEq/L (ref 19–32)
Calcium: 9.7 mg/dL (ref 8.4–10.5)
Chloride: 101 mEq/L (ref 96–112)
Creatinine, Ser: 0.68 mg/dL (ref 0.40–1.50)
GFR: 103.96 mL/min (ref >60.00)
Glucose, Bld: 152 mg/dL — ABNORMAL HIGH (ref 70–99)
Potassium: 3.8 mEq/L (ref 3.5–5.1)
Sodium: 139 mEq/L (ref 135–145)
Total Bilirubin: 0.7 mg/dL (ref 0.2–1.2)
Total Protein: 6.6 g/dL (ref 6.0–8.3)

## 2023-06-09 LAB — CBC WITH DIFFERENTIAL/PLATELET
Basophils Absolute: 0 10*3/uL (ref 0.0–0.1)
Basophils Relative: 0.5 % (ref 0.0–3.0)
Eosinophils Absolute: 0.3 10*3/uL (ref 0.0–0.7)
Eosinophils Relative: 3.4 % (ref 0.0–5.0)
HCT: 46.7 % (ref 39.0–52.0)
Hemoglobin: 14.7 g/dL (ref 13.0–17.0)
Lymphocytes Relative: 31.8 % (ref 12.0–46.0)
Lymphs Abs: 2.5 10*3/uL (ref 0.7–4.0)
MCHC: 31.6 g/dL (ref 30.0–36.0)
MCV: 80.6 fl (ref 78.0–100.0)
Monocytes Absolute: 1.1 10*3/uL — ABNORMAL HIGH (ref 0.1–1.0)
Monocytes Relative: 13.2 % — ABNORMAL HIGH (ref 3.0–12.0)
Neutro Abs: 4.1 10*3/uL (ref 1.4–7.7)
Neutrophils Relative %: 51.1 % (ref 43.0–77.0)
Platelets: 205 10*3/uL (ref 150.0–400.0)
RBC: 5.8 Mil/uL (ref 4.22–5.81)
RDW: 16.4 % — ABNORMAL HIGH (ref 11.5–15.5)
WBC: 8 10*3/uL (ref 4.0–10.5)

## 2023-06-09 LAB — LIPID PANEL
Cholesterol: 195 mg/dL (ref 0–200)
HDL: 44.2 mg/dL (ref >39.00)
LDL Cholesterol: 129 mg/dL — ABNORMAL HIGH (ref 0–99)
NonHDL: 151.04
Total CHOL/HDL Ratio: 4
Triglycerides: 111 mg/dL (ref 0.0–149.0)
VLDL: 22.2 mg/dL (ref 0.0–40.0)

## 2023-06-09 LAB — MICROALBUMIN / CREATININE URINE RATIO
Creatinine,U: 92 mg/dL
Microalb Creat Ratio: 0.8 mg/g (ref 0.0–30.0)
Microalb, Ur: <0.7 mg/dL (ref 0.0–1.9)

## 2023-06-09 MED ORDER — DAPAGLIFLOZIN PROPANEDIOL 10 MG PO TABS
10.0000 mg | ORAL_TABLET | Freq: Every day | ORAL | 3 refills | Status: DC
Start: 2023-06-09 — End: 2023-12-09

## 2023-06-09 MED ORDER — METFORMIN HCL 500 MG PO TABS: 1000.0000 mg | ORAL_TABLET | Freq: Two times a day (BID) | ORAL | 3 refills | Status: DC

## 2023-06-09 NOTE — Progress Notes (Signed)
Pt given Revlimid rx on 06/05/23. Pt to start taking on 06/06/23 and take for 21 days: 06/06/23 - 06/26/23. Pt will take 7 days off from 06/27/23 - 07/03/23. Pt will restart next cycle on 07/04/23.

## 2023-06-09 NOTE — Progress Notes (Signed)
[facility]: [id number]   Routine Medical Office Visit  Patient:  Donald Suarez      Age: 57 y.o.       Sex:  male  Date:   06/09/2023 PCP:    Lula Olszewski, MD   Today's Healthcare Provider: Lula Olszewski, MD   Assessment and Plan:   AI-Extracted Assessment and Plan    Diabetes Mellitus: His diabetes is poorly controlled, evidenced by recent high glucose values, while he is currently taking Metformin 1 tablet twice daily. We will increase Metformin to 2 tablets twice daily and add Farxiga for better glucose control and renal protection. Today, we will check his Hemoglobin A1c and glucose levels. He has been provided with diabetes nutrition information and a carbohydrate counting guide, emphasizing the importance of dietary changes.  Blurry Vision: He has ongoing blurry vision, potentially due to his poorly controlled diabetes. We will refer him to Dr. Jerilynn Som for an ophthalmology evaluation.  Multiple Myeloma: He is currently in remission from multiple myeloma, with high protein levels in the blood causing low albumin levels. We discussed the potential for recurrence and stressed the importance of ongoing monitoring. He will continue his current treatment and monitoring as directed by his hematologist.  Dental Issues: He has multiple dental problems, now considered a single issue since he has consulted a dentist. He will continue dental follow-up as needed.  We will follow up in 3 months unless lab results indicate a need for earlier intervention.        Charting-Extracted Assessment and Plan Cung was seen today for 3 month follow-up, diabetes management plan and diabetes.  Blurry vision Overview: June 09, 2023 interim history:   yet to see ophthalmology  Orders: -     Ambulatory referral to Ophthalmology  History of dental problems Overview: Abfraction, accreions on teeth, attrition, theeth excessive, caries, chronic apical periodontitis, defenctive  dental restoration, loose teeth, malocclusion   Language barrier affecting health care Overview: He has translator sometimes which is helpful [place] speaks only [place], and small amount of english   Type 2 diabetes mellitus with hyperglycemia, without long-term current use of insulin (HCC) Overview: Lab Results  Component Value Date   HGBA1C 8.2 (A) 03/09/2023   HGBA1C 6.7 (H) 08/12/2022   HGBA1C 8.2 (H) 05/13/2022   Lab Results  Component Value Date/Time   GLUCOSE 254 (H) 06/05/2023 09:08 AM   GLUCOSE 201 (H) 05/08/2023 09:15 AM   GLUCOSE 252 (H) 04/24/2023 09:45 AM   GLUCOSE 275 (H) 04/10/2023 12:14 PM   GLUCOSE 135 (H) 03/27/2023 10:31 AM   GLUCOSE 301 (H) 03/13/2023 12:09 PM   GLUCOSE 185 (H) 02/27/2023 09:43 AM   GLUCOSE 135 (H) 02/13/2023 11:52 AM   GLUCOSE 267 (H) 01/30/2023 11:38 AM   GLUCOSE 246 (H) 01/16/2023 12:22 PM    Metformin only Gabapentin for neuropathy   Assessment & Plan: Diabetes is currently poorly controlled, based on available Hemoglobin A1c and glucose monitoring data listed in problem overview.  Diabetic education: ongoing education regarding chronic disease management for diabetes was given today. We continue to reinforce the ABC's of diabetic management: A1c (<7 or 8 dependent upon patient), tight blood pressure control, and cholesterol management with goal LDL < 100 minimally. We discuss diet strategies, exercise recommendations, medication options and possible side effects. At each visit, we review recommended immunizations and preventive care recommendations for diabetics and stress that good diabetic control can prevent other problems.  Importance of regular foot checks and  yearly eye exams has been reinforced and is included here for a reminder  Diabetic Medications-Plan for today Increase metformin from 500->1000 twice daily with meals he didn't appreciate that prior due to language barrier Try to get farxiga to protect kidneys    Note the adjustments from prior.  These are also available for review on AVS   Orders: -     Ambulatory referral to Ophthalmology -     CBC with Differential/Platelet -     Comprehensive metabolic panel -     Lipid panel -     Hemoglobin A1c -     Dapagliflozin Propanediol; Take 1 tablet (10 mg total) by mouth daily before breakfast.  Dispense: 90 tablet; Refill: 3 -     Microalbumin / creatinine urine ratio  Screening for malignant neoplasm of colon -     Cologuard  Type 2 diabetes mellitus with hyperglycemia, with long-term current use of insulin (HCC) Overview: Lab Results  Component Value Date   HGBA1C 8.2 (A) 03/09/2023   HGBA1C 6.7 (H) 08/12/2022   HGBA1C 8.2 (H) 05/13/2022   Lab Results  Component Value Date/Time   GLUCOSE 254 (H) 06/05/2023 09:08 AM   GLUCOSE 201 (H) 05/08/2023 09:15 AM   GLUCOSE 252 (H) 04/24/2023 09:45 AM   GLUCOSE 275 (H) 04/10/2023 12:14 PM   GLUCOSE 135 (H) 03/27/2023 10:31 AM   GLUCOSE 301 (H) 03/13/2023 12:09 PM   GLUCOSE 185 (H) 02/27/2023 09:43 AM   GLUCOSE 135 (H) 02/13/2023 11:52 AM   GLUCOSE 267 (H) 01/30/2023 11:38 AM   GLUCOSE 246 (H) 01/16/2023 12:22 PM    Metformin only Gabapentin for neuropathy   Assessment & Plan: Diabetes is currently poorly controlled, based on available Hemoglobin A1c and glucose monitoring data listed in problem overview.  Diabetic education: ongoing education regarding chronic disease management for diabetes was given today. We continue to reinforce the ABC's of diabetic management: A1c (<7 or 8 dependent upon patient), tight blood pressure control, and cholesterol management with goal LDL < 100 minimally. We discuss diet strategies, exercise recommendations, medication options and possible side effects. At each visit, we review recommended immunizations and preventive care recommendations for diabetics and stress that good diabetic control can prevent other problems.  Importance of regular foot checks and  yearly eye exams has been reinforced and is included here for a reminder  Diabetic Medications-Plan for today Increase metformin from 500->1000 twice daily with meals he didn't appreciate that prior due to language barrier Try to get farxiga to protect kidneys   Note the adjustments from prior.  These are also available for review on AVS   Orders: -     metFORMIN HCl; Take 2 tablets (1,000 mg total) by mouth 2 (two) times daily with a meal. Note dose increased. Replaces 500 mg twice daily.  Dispense: 180 tablet; Refill: 3    Medical Decision Making: 1 or more chronic illnesses with exacerbation,  progression, or side effects of treatment     Ordering of each unique test;4 Prescription drug management  Diagnosis or treatment significantly limited by social determinants of health; language barrier        Clinical Presentation:    57 y.o. male here today for 3 month follow-up, Diabetes Management Plan, and Diabetes  AI-Extracted: Discussed the use of AI scribe software for clinical note transcription with the patient, who gave verbal consent to proceed.  History of Present Illness   The patient, with a history of multiple myeloma currently in remission,  diabetes, and dental issues, presents for a three-month follow-up visit. He reports persistent blurry vision, for which he has not yet seen an eye doctor. He also mentions that he has seen a doctor for his dental issues.  Regarding his diabetes, he has been taking metformin once in the morning and once in the evening. However, recent glucose values have been high, indicating poor control of his diabetes. He has not reported any new symptoms or health issues and overall, he feels a lot better than before.  The patient also has a history of working in landscaping, but he denies significant exposure to chemicals. He has been maintaining a diet that includes rice as a main dish, which could be contributing to his high glucose levels.         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   Hyperproteinemia 12/20/2020   Blurry vision 12/20/2020   Hypomagnesemia 12/20/2020   Elevated ferritin 08/07/2022   Microalbuminuria 08/19/2022   Hypocalcemia 08/19/2022   Peripheral neuropathy 09/08/2022   Language barrier affecting health care 03/09/2023   Hypokalemia 03/09/2023   History of dental problems 06/09/2023   Resolved Ambulatory Problems    Diagnosis Date Noted   Symptomatic anemia 05/13/2022   Hyponatremia 05/13/2022   Hyperglycemia 05/13/2022   Thrombocytopenia (HCC) 05/13/2022   Encounter for preoperative dental examination 08/06/2022   Accretions on teeth 08/06/2022   Periodontal disease 08/06/2022   Teeth missing 08/06/2022   Caries 08/06/2022   Loose, teeth 08/06/2022   Abfraction 08/06/2022   Attrition, teeth excessive 08/06/2022   Defective dental restoration 08/06/2022   Malocclusion 08/06/2022   Past Medical History:  Diagnosis Date   Anemia     Outpatient Medications Prior to Visit  Medication Sig   Accu-Chek Softclix Lancets lancets Use to test blood sugars up to 4 times daily as directed.   acyclovir (ZOVIRAX) 400 MG tablet Take 1 tablet (400 mg total) by mouth 2 (two) times daily.   aspirin EC 81 MG tablet Take 1 tablet (81 mg total) by mouth daily. Swallow whole.   Blood Glucose Monitoring Suppl (ACCU-CHEK GUIDE) w/Device KIT Use to test blood sugars up to 4 times daily as directed.   Continuous Blood Gluc Sensor (FREESTYLE LIBRE 3 SENSOR) MISC 1 Application by Does not apply route every 14 (fourteen) days. Place 1 sensor on the skin every 14 days. Use to check glucose continuously    Cyanocobalamin (B-12) 1000 MCG CAPS Take 1 tablet by mouth daily.   gabapentin (NEURONTIN) 300 MG capsule Take 1 capsule (300 mg total) by mouth 3 (three) times daily.   glucose blood (ACCU-CHEK GUIDE) test strip Use to test blood sugars up to 4 times daily as directed.   lenalidomide (REVLIMID) 25 MG capsule TAKE 1 CAPSULE BY MOUTH 1 TIME A DAY FOR 21 DAYS ON THEN 7 DAYS OFF   ondansetron (ZOFRAN) 8 MG tablet Take 1 tablet (8 mg total) by mouth every 8 (eight) hours as needed for nausea or vomiting.   potassium chloride SA (KLOR-CON M) 20 MEQ tablet Take 1 tablet (20 mEq total) by mouth daily.   prochlorperazine (COMPAZINE) 10 MG tablet Take 1 tablet (10 mg total) by mouth every 6 (six) hours  as needed for nausea or vomiting.   [DISCONTINUED] metFORMIN (GLUCOPHAGE) 500 MG tablet Take 2 tablets (1,000 mg total) by mouth 2 (two) times daily with a meal. Note dose increased. Replaces 500 mg twice daily.   Facility-Administered Medications Prior to Visit  Medication Dose Route Frequency Provider   clotrimazole (LOTRIMIN) 1 % cream   Topical BID Lula Olszewski, MD         Clinical Data Analysis:   Physical Exam  BP 132/85 (BP Location: Left Arm, Patient Position: Sitting)   Pulse 70   Temp 98.1 F (36.7 C) (Temporal)   Ht 5\' 6"  (1.676 m)   Wt 172 lb (78 kg)   SpO2 99%   BMI 27.76 kg/m  Wt Readings from Last 10 Encounters:  06/09/23 172 lb (78 kg)  06/05/23 176 lb 4.8 oz (80 kg)  05/08/23 176 lb 4 oz (79.9 kg)  04/24/23 176 lb 6.4 oz (80 kg)  04/10/23 178 lb (80.7 kg)  03/27/23 175 lb 6.4 oz (79.6 kg)  03/27/23 172 lb 14.4 oz (78.4 kg)  03/09/23 175 lb 12.8 oz (79.7 kg)  02/27/23 174 lb 4 oz (79 kg)  02/13/23 171 lb 8.3 oz (77.8 kg)   Vital signs reviewed.  Nursing notes reviewed. Weight trend reviewed. Abnormalities and Problem-Specific physical exam findings:  looks well. General Appearance:  No acute distress appreciable.   Well-groomed, healthy-appearing male.  Well  proportioned with no abnormal fat distribution.  Good muscle tone. Skin: Clear and well-hydrated. Pulmonary:  Normal work of breathing at rest, no respiratory distress apparent. SpO2: 99 %  Musculoskeletal: All extremities are intact.  Neurological:  Awake, alert, oriented, and engaged.  No obvious focal neurological deficits or cognitive impairments.  Sensorium seems unclouded.   Speech is clear and coherent with logical content. Psychiatric:  Appropriate mood, pleasant and cooperative demeanor, thoughtful and engaged during the exam  Results Reviewed:    Results for orders placed or performed in visit on 06/09/23  CBC with Differential/Platelet  Result Value Ref Range   WBC 8.0 4.0 - 10.5 K/uL   RBC 5.80 4.22 - 5.81 Mil/uL   Hemoglobin 14.7 13.0 - 17.0 g/dL   HCT 16.1 09.6 - 04.5 %   MCV 80.6 78.0 - 100.0 fl   MCHC 31.6 30.0 - 36.0 g/dL   RDW 40.9 (H) 81.1 - 91.4 %   Platelets 205.0 150.0 - 400.0 K/uL   Neutrophils Relative % 51.1 43.0 - 77.0 %   Lymphocytes Relative 31.8 12.0 - 46.0 %   Monocytes Relative 13.2 (H) 3.0 - 12.0 %   Eosinophils Relative 3.4 0.0 - 5.0 %   Basophils Relative 0.5 0.0 - 3.0 %   Neutro Abs 4.1 1.4 - 7.7 K/uL   Lymphs Abs 2.5 0.7 - 4.0 K/uL   Monocytes Absolute 1.1 (H) 0.1 - 1.0 K/uL   Eosinophils Absolute 0.3 0.0 - 0.7 K/uL   Basophils Absolute 0.0 0.0 - 0.1 K/uL  Comprehensive metabolic panel  Result Value Ref Range   Sodium 139 135 - 145 mEq/L   Potassium 3.8 3.5 - 5.1 mEq/L   Chloride 101 96 - 112 mEq/L   CO2 29 19 - 32 mEq/L   Glucose, Bld 152 (H) 70 - 99 mg/dL   BUN 15 6 - 23 mg/dL   Creatinine, Ser 7.82 0.40 - 1.50 mg/dL   Total Bilirubin 0.7 0.2 - 1.2 mg/dL   Alkaline Phosphatase 56 39 - 117 U/L   AST 13 0 - 37  U/L   ALT 19 0 - 53 U/L   Total Protein 6.6 6.0 - 8.3 g/dL   Albumin 4.3 3.5 - 5.2 g/dL   GFR 161.09 >60.45 mL/min   Calcium 9.7 8.4 - 10.5 mg/dL  Lipid panel  Result Value Ref Range   Cholesterol 195 0 - 200 mg/dL    Triglycerides 409.8 0.0 - 149.0 mg/dL   HDL 11.91 >47.82 mg/dL   VLDL 95.6 0.0 - 21.3 mg/dL   LDL Cholesterol 086 (H) 0 - 99 mg/dL   Total CHOL/HDL Ratio 4    NonHDL 151.04   Microalbumin / creatinine urine ratio  Result Value Ref Range   Microalb, Ur <0.7 0.0 - 1.9 mg/dL   Creatinine,U 57.8 mg/dL   Microalb Creat Ratio 0.8 0.0 - 30.0 mg/g    Recent Results (from the past 2160 hour(s))  CBC with Differential (Cancer Center Only)     Status: Abnormal   Collection Time: 03/13/23 12:09 PM  Result Value Ref Range   WBC Count 7.7 4.0 - 10.5 K/uL   RBC 5.22 4.22 - 5.81 MIL/uL   Hemoglobin 14.0 13.0 - 17.0 g/dL   HCT 46.9 62.9 - 52.8 %   MCV 78.2 (L) 80.0 - 100.0 fL   MCH 26.8 26.0 - 34.0 pg   MCHC 34.3 30.0 - 36.0 g/dL   RDW 41.3 24.4 - 01.0 %   Platelet Count 206 150 - 400 K/uL   nRBC 0.0 0.0 - 0.2 %   Neutrophils Relative % 42 %   Neutro Abs 3.3 1.7 - 7.7 K/uL   Lymphocytes Relative 42 %   Lymphs Abs 3.3 0.7 - 4.0 K/uL   Monocytes Relative 10 %   Monocytes Absolute 0.8 0.1 - 1.0 K/uL   Eosinophils Relative 4 %   Eosinophils Absolute 0.3 0.0 - 0.5 K/uL   Basophils Relative 2 %   Basophils Absolute 0.1 0.0 - 0.1 K/uL   Immature Granulocytes 0 %   Abs Immature Granulocytes 0.02 0.00 - 0.07 K/uL    Comment: Performed at [facility], [address] [place], [place] [address]  Comprehensive metabolic panel     Status: Abnormal   Collection Time: 03/13/23 12:09 PM  Result Value Ref Range   Sodium 135 135 - 145 mmol/L   Potassium 3.5 3.5 - 5.1 mmol/L   Chloride 103 98 - 111 mmol/L   CO2 23 22 - 32 mmol/L   Glucose, Bld 301 (H) 70 - 99 mg/dL    Comment: Glucose reference range applies only to samples taken after fasting for at least 8 hours.   BUN 16 6 - 20 mg/dL   Creatinine, Ser 6.64 0.61 - 1.24 mg/dL   Calcium 8.7 (L) 8.9 - 10.3 mg/dL   Total Protein 6.4 (L) 6.5 - 8.1 g/dL   Albumin 4.0 3.5 - 5.0 g/dL   AST 14 (L) 15 - 41 U/L   ALT 17 0 - 44 U/L    Alkaline Phosphatase 45 38 - 126 U/L   Total Bilirubin 0.5 0.3 - 1.2 mg/dL   GFR, Estimated >40 >34 mL/min    Comment: (NOTE) Calculated using the CKD-EPI Creatinine Equation (2021)    Anion gap 9 5 - 15    Comment: Performed at [facility], [address], [place], [place] [address]  Multiple Myeloma Panel (SPEP&IFE w/QIG)     Status: Abnormal   Collection Time: 03/13/23 12:09 PM  Result Value Ref Range   IgG (  Immunoglobin G), Serum 573 (L) 603 - 1,613 mg/dL   IgA 161 90 - 096 mg/dL   IgM (Immunoglobulin M), Srm 23 20 - 172 mg/dL    Comment: Result confirmed on concentration.   Total Protein ELP 6.0 6.0 - 8.5 g/dL   Albumin SerPl Elph-Mcnc 3.6 2.9 - 4.4 g/dL   Alpha 1 0.2 0.0 - 0.4 g/dL   Alpha2 Glob SerPl Elph-Mcnc 0.8 0.4 - 1.0 g/dL   B-Globulin SerPl Elph-Mcnc 0.9 0.7 - 1.3 g/dL   Gamma Glob SerPl Elph-Mcnc 0.5 0.4 - 1.8 g/dL   M Protein SerPl Elph-Mcnc 0.1 (H) Not Observed g/dL   Globulin, Total 2.4 2.2 - 3.9 g/dL   Albumin/Glob SerPl 1.6 0.7 - 1.7   IFE 1 Comment (A)     Comment: (NOTE) Immunofixation shows IgG monoclonal protein with kappa light chain specificity. PLEASE NOTE: Samples from patients receiving DARZALEX(R) (daratumumab) or SARCLISA(R)(isatuximab-irfc) treatment can appear as an "IgG kappa" and mask a complete response (CR). If this patient is receiving these therapies, this IFE assay interference can be removed by ordering test number 123218-"Immunofixation, Daratumumab-Specific, Serum" or [id number]-"Immunofixation, Isatuximab-Specific, Serum" and submitting a new sample for testing or by calling the lab to add this test to the current sample. Monoclonal bands detected are faint. Suggest retesting in 4-6 months.    Please Note Comment     Comment: (NOTE) Protein electrophoresis scan will follow via computer, mail, or courier delivery. Performed At: [facility] [address] [place], [place] [id number] Jolene Schimke MD BJ:[id number]   CBC with Differential (Cancer Center Only)     Status: Abnormal   Collection Time: 03/27/23 10:31 AM  Result Value Ref Range   WBC Count 10.3 4.0 - 10.5 K/uL   RBC 5.69 4.22 - 5.81 MIL/uL   Hemoglobin 15.0 13.0 - 17.0 g/dL   HCT 08.6 57.8 - 46.9 %   MCV 79.4 (L) 80.0 - 100.0 fL   MCH 26.4 26.0 - 34.0 pg   MCHC 33.2 30.0 - 36.0 g/dL   RDW 62.9 52.8 - 41.3 %   Platelet Count 185 150 - 400 K/uL   nRBC 0.0 0.0 - 0.2 %   Neutrophils Relative % 50 %   Neutro Abs 5.2 1.7 - 7.7 K/uL   Lymphocytes Relative 34 %   Lymphs Abs 3.5 0.7 - 4.0 K/uL   Monocytes Relative 13 %   Monocytes Absolute 1.3 (H) 0.1 - 1.0 K/uL   Eosinophils Relative 2 %   Eosinophils Absolute 0.2 0.0 - 0.5 K/uL   Basophils Relative 1 %   Basophils Absolute 0.1 0.0 - 0.1 K/uL   Immature Granulocytes 0 %   Abs Immature Granulocytes 0.03 0.00 - 0.07 K/uL    Comment: Performed at [facility], [address], [place], [place] [address]  Comprehensive metabolic panel     Status: Abnormal   Collection Time: 03/27/23 10:31 AM  Result Value Ref Range   Sodium 140 135 - 145 mmol/L   Potassium 4.0 3.5 - 5.1 mmol/L   Chloride 107 98 - 111 mmol/L   CO2 27 22 - 32 mmol/L   Glucose, Bld 135 (H) 70 - 99 mg/dL    Comment: Glucose reference range applies only to samples taken after fasting for at least 8 hours.   BUN 13 6 - 20 mg/dL   Creatinine, Ser 0.27 0.61 - 1.24 mg/dL   Calcium 9.0 8.9 - 25.3 mg/dL   Total Protein 7.0 6.5 - 8.1  g/dL   Albumin 4.3 3.5 - 5.0 g/dL   AST 15 15 - 41 U/L   ALT 22 0 - 44 U/L   Alkaline Phosphatase 55 38 - 126 U/L   Total Bilirubin 0.6 0.3 - 1.2 mg/dL   GFR, Estimated >16 >10 mL/min    Comment: (NOTE) Calculated using the CKD-EPI Creatinine Equation (2021)    Anion gap 6 5 - 15    Comment: Performed at [facility], [address] [place], [place] [address]  Multiple Myeloma Panel (SPEP&IFE w/QIG)     Status:  Abnormal   Collection Time: 04/10/23 12:14 PM  Result Value Ref Range   IgG (Immunoglobin G), Serum 564 (L) 603 - 1,613 mg/dL   IgA 409 90 - 811 mg/dL   IgM (Immunoglobulin M), Srm 22 20 - 172 mg/dL    Comment: Result confirmed on concentration.   Total Protein ELP 6.1 6.0 - 8.5 g/dL   Albumin SerPl Elph-Mcnc 3.6 2.9 - 4.4 g/dL   Alpha 1 0.3 0.0 - 0.4 g/dL   Alpha2 Glob SerPl Elph-Mcnc 0.8 0.4 - 1.0 g/dL   B-Globulin SerPl Elph-Mcnc 0.9 0.7 - 1.3 g/dL   Gamma Glob SerPl Elph-Mcnc 0.5 0.4 - 1.8 g/dL   M Protein SerPl Elph-Mcnc Not Observed Not Observed g/dL   Globulin, Total 2.5 2.2 - 3.9 g/dL   Albumin/Glob SerPl 1.5 0.7 - 1.7   IFE 1 Comment     Comment: (NOTE) The immunofixation pattern appears unremarkable. Evidence of monoclonal protein is not apparent.    Please Note Comment     Comment: (NOTE) Protein electrophoresis scan will follow via computer, mail, or courier delivery. Performed At: [facility] [address] [place], [place] [id number] Jolene Schimke MD OZ:[id number]   CMP (Cancer Center only)     Status: Abnormal   Collection Time: 04/10/23 12:14 PM  Result Value Ref Range   Sodium 135 135 - 145 mmol/L   Potassium 3.5 3.5 - 5.1 mmol/L   Chloride 103 98 - 111 mmol/L   CO2 24 22 - 32 mmol/L   Glucose, Bld 275 (H) 70 - 99 mg/dL    Comment: Glucose reference range applies only to samples taken after fasting for at least 8 hours.   BUN 12 6 - 20 mg/dL   Creatinine 6.29 5.28 - 1.24 mg/dL   Calcium 8.8 (L) 8.9 - 10.3 mg/dL   Total Protein 6.5 6.5 - 8.1 g/dL   Albumin 4.1 3.5 - 5.0 g/dL   AST 14 (L) 15 - 41 U/L   ALT 25 0 - 44 U/L   Alkaline Phosphatase 47 38 - 126 U/L   Total Bilirubin 0.6 0.3 - 1.2 mg/dL   GFR, Estimated >41 >32 mL/min    Comment: (NOTE) Calculated using the CKD-EPI Creatinine Equation (2021)    Anion gap 8 5 - 15    Comment: Performed at [facility], [address] [place], [place] [address]  CBC with  Differential (Cancer Center Only)     Status: Abnormal   Collection Time: 04/10/23 12:14 PM  Result Value Ref Range   WBC Count 7.9 4.0 - 10.5 K/uL   RBC 5.51 4.22 - 5.81 MIL/uL   Hemoglobin 14.3 13.0 - 17.0 g/dL   HCT 27.2 53.6 - 64.4 %   MCV 77.9 (L) 80.0 - 100.0 fL   MCH 26.0 26.0 - 34.0 pg   MCHC 33.3 30.0 - 36.0 g/dL   RDW 03.4 74.2 -  15.5 %   Platelet Count 200 150 - 400 K/uL   nRBC 0.0 0.0 - 0.2 %   Neutrophils Relative % 58 %   Neutro Abs 4.7 1.7 - 7.7 K/uL   Lymphocytes Relative 31 %   Lymphs Abs 2.4 0.7 - 4.0 K/uL   Monocytes Relative 7 %   Monocytes Absolute 0.6 0.1 - 1.0 K/uL   Eosinophils Relative 2 %   Eosinophils Absolute 0.2 0.0 - 0.5 K/uL   Basophils Relative 1 %   Basophils Absolute 0.1 0.0 - 0.1 K/uL   Immature Granulocytes 1 %   Abs Immature Granulocytes 0.04 0.00 - 0.07 K/uL    Comment: Performed at [facility], [address] [place], [place] [address]  CBC with Differential (Cancer Center Only)     Status: Abnormal   Collection Time: 04/24/23  9:45 AM  Result Value Ref Range   WBC Count 7.9 4.0 - 10.5 K/uL   RBC 5.40 4.22 - 5.81 MIL/uL   Hemoglobin 14.1 13.0 - 17.0 g/dL   HCT 60.4 54.0 - 98.1 %   MCV 78.1 (L) 80.0 - 100.0 fL   MCH 26.1 26.0 - 34.0 pg   MCHC 33.4 30.0 - 36.0 g/dL   RDW 19.1 47.8 - 29.5 %   Platelet Count 190 150 - 400 K/uL   nRBC 0.0 0.0 - 0.2 %   Neutrophils Relative % 52 %   Neutro Abs 4.1 1.7 - 7.7 K/uL   Lymphocytes Relative 31 %   Lymphs Abs 2.5 0.7 - 4.0 K/uL   Monocytes Relative 11 %   Monocytes Absolute 0.8 0.1 - 1.0 K/uL   Eosinophils Relative 5 %   Eosinophils Absolute 0.4 0.0 - 0.5 K/uL   Basophils Relative 1 %   Basophils Absolute 0.1 0.0 - 0.1 K/uL   Immature Granulocytes 0 %   Abs Immature Granulocytes 0.02 0.00 - 0.07 K/uL    Comment: Performed at [facility], [address] [place], [place] [address]  Comprehensive metabolic panel     Status: Abnormal    Collection Time: 04/24/23  9:45 AM  Result Value Ref Range   Sodium 136 135 - 145 mmol/L   Potassium 3.5 3.5 - 5.1 mmol/L   Chloride 104 98 - 111 mmol/L   CO2 25 22 - 32 mmol/L   Glucose, Bld 252 (H) 70 - 99 mg/dL    Comment: Glucose reference range applies only to samples taken after fasting for at least 8 hours.   BUN 13 6 - 20 mg/dL   Creatinine, Ser 8.65 0.61 - 1.24 mg/dL   Calcium 9.2 8.9 - 78.4 mg/dL   Total Protein 6.1 (L) 6.5 - 8.1 g/dL   Albumin 4.0 3.5 - 5.0 g/dL   AST 13 (L) 15 - 41 U/L   ALT 16 0 - 44 U/L   Alkaline Phosphatase 44 38 - 126 U/L   Total Bilirubin 0.5 0.3 - 1.2 mg/dL   GFR, Estimated >69 >62 mL/min    Comment: (NOTE) Calculated using the CKD-EPI Creatinine Equation (2021)    Anion gap 7 5 - 15    Comment: Performed at [facility], [address] [place], [place] [address]  CBC with Differential (Cancer Center Only)     Status: Abnormal   Collection Time: 05/08/23  9:15 AM  Result Value Ref Range   WBC Count 9.2 4.0 - 10.5 K/uL   RBC 5.76 4.22 - 5.81 MIL/uL  Hemoglobin 15.0 13.0 - 17.0 g/dL   HCT 95.2 84.1 - 32.4 %   MCV 79.0 (L) 80.0 - 100.0 fL   MCH 26.0 26.0 - 34.0 pg   MCHC 33.0 30.0 - 36.0 g/dL   RDW 40.1 02.7 - 25.3 %   Platelet Count 178 150 - 400 K/uL   nRBC 0.0 0.0 - 0.2 %   Neutrophils Relative % 63 %   Neutro Abs 5.7 1.7 - 7.7 K/uL   Lymphocytes Relative 22 %   Lymphs Abs 2.1 0.7 - 4.0 K/uL   Monocytes Relative 9 %   Monocytes Absolute 0.8 0.1 - 1.0 K/uL   Eosinophils Relative 5 %   Eosinophils Absolute 0.4 0.0 - 0.5 K/uL   Basophils Relative 1 %   Basophils Absolute 0.1 0.0 - 0.1 K/uL   Immature Granulocytes 0 %   Abs Immature Granulocytes 0.03 0.00 - 0.07 K/uL    Comment: Performed at [facility], [address], [place], [place] [address]  Comprehensive metabolic panel     Status: Abnormal   Collection Time: 05/08/23  9:15 AM  Result Value Ref Range   Sodium 137 135 - 145  mmol/L   Potassium 4.1 3.5 - 5.1 mmol/L   Chloride 103 98 - 111 mmol/L   CO2 26 22 - 32 mmol/L   Glucose, Bld 201 (H) 70 - 99 mg/dL    Comment: Glucose reference range applies only to samples taken after fasting for at least 8 hours.   BUN 11 6 - 20 mg/dL   Creatinine, Ser 3.47 0.61 - 1.24 mg/dL   Calcium 9.0 8.9 - 42.5 mg/dL   Total Protein 7.0 6.5 - 8.1 g/dL   Albumin 4.4 3.5 - 5.0 g/dL   AST 16 15 - 41 U/L   ALT 25 0 - 44 U/L   Alkaline Phosphatase 50 38 - 126 U/L   Total Bilirubin 0.7 0.3 - 1.2 mg/dL   GFR, Estimated >95 >63 mL/min    Comment: (NOTE) Calculated using the CKD-EPI Creatinine Equation (2021)    Anion gap 8 5 - 15    Comment: Performed at [facility], [address], [place], [place] [address]  Multiple Myeloma Panel (SPEP&IFE w/QIG)     Status: None   Collection Time: 05/08/23  9:15 AM  Result Value Ref Range   IgG (Immunoglobin G), Serum 640 603 - 1,613 mg/dL   IgA 332 90 - 951 mg/dL   IgM (Immunoglobulin M), Srm 25 20 - 172 mg/dL    Comment: Result confirmed on concentration.   Total Protein ELP 6.5 6.0 - 8.5 g/dL   Albumin SerPl Elph-Mcnc 3.7 2.9 - 4.4 g/dL   Alpha 1 0.4 0.0 - 0.4 g/dL   Alpha2 Glob SerPl Elph-Mcnc 0.8 0.4 - 1.0 g/dL   B-Globulin SerPl Elph-Mcnc 0.9 0.7 - 1.3 g/dL   Gamma Glob SerPl Elph-Mcnc 0.6 0.4 - 1.8 g/dL   M Protein SerPl Elph-Mcnc Not Observed Not Observed g/dL   Globulin, Total 2.8 2.2 - 3.9 g/dL   Albumin/Glob SerPl 1.4 0.7 - 1.7   IFE 1 Comment     Comment: (NOTE) The immunofixation pattern appears unremarkable. Evidence of monoclonal protein is not apparent.    Please Note Comment     Comment: (NOTE) Protein electrophoresis scan will follow via computer, mail, or courier delivery. Performed At: [facility] [address] [place], [place] [id number] Jolene Schimke MD KZ:[id number]   CBC with Differential (Cancer  Center Only)     Status: Abnormal   Collection Time: 06/05/23  9:08 AM   Result Value Ref Range   WBC Count 6.7 4.0 - 10.5 K/uL   RBC 5.58 4.22 - 5.81 MIL/uL   Hemoglobin 14.7 13.0 - 17.0 g/dL   HCT 40.9 81.1 - 91.4 %   MCV 77.4 (L) 80.0 - 100.0 fL   MCH 26.3 26.0 - 34.0 pg   MCHC 34.0 30.0 - 36.0 g/dL   RDW 78.2 95.6 - 21.3 %   Platelet Count 199 150 - 400 K/uL   nRBC 0.0 0.0 - 0.2 %   Neutrophils Relative % 50 %   Neutro Abs 3.4 1.7 - 7.7 K/uL   Lymphocytes Relative 35 %   Lymphs Abs 2.4 0.7 - 4.0 K/uL   Monocytes Relative 9 %   Monocytes Absolute 0.6 0.1 - 1.0 K/uL   Eosinophils Relative 4 %   Eosinophils Absolute 0.3 0.0 - 0.5 K/uL   Basophils Relative 2 %   Basophils Absolute 0.1 0.0 - 0.1 K/uL   Immature Granulocytes 0 %   Abs Immature Granulocytes 0.02 0.00 - 0.07 K/uL    Comment: Performed at [facility], [address] [place], [place] [address]  Comprehensive metabolic panel     Status: Abnormal   Collection Time: 06/05/23  9:08 AM  Result Value Ref Range   Sodium 137 135 - 145 mmol/L   Potassium 3.4 (L) 3.5 - 5.1 mmol/L   Chloride 102 98 - 111 mmol/L   CO2 26 22 - 32 mmol/L   Glucose, Bld 254 (H) 70 - 99 mg/dL    Comment: Glucose reference range applies only to samples taken after fasting for at least 8 hours.   BUN 15 6 - 20 mg/dL   Creatinine, Ser 8.46 0.61 - 1.24 mg/dL   Calcium 9.2 8.9 - 96.2 mg/dL   Total Protein 6.8 6.5 - 8.1 g/dL   Albumin 4.1 3.5 - 5.0 g/dL   AST 14 (L) 15 - 41 U/L   ALT 20 0 - 44 U/L   Alkaline Phosphatase 57 38 - 126 U/L   Total Bilirubin 0.5 0.3 - 1.2 mg/dL   GFR, Estimated >95 >28 mL/min    Comment: (NOTE) Calculated using the CKD-EPI Creatinine Equation (2021)    Anion gap 9 5 - 15    Comment: Performed at [facility], [address], [place], [place] [address]  CBC with Differential/Platelet     Status: Abnormal   Collection Time: 06/09/23  9:31 AM  Result Value Ref Range   WBC 8.0 4.0 - 10.5 K/uL   RBC 5.80 4.22 - 5.81 Mil/uL   Hemoglobin  14.7 13.0 - 17.0 g/dL   HCT 40.1 02.7 - 25.3 %   MCV 80.6 78.0 - 100.0 fl   MCHC 31.6 30.0 - 36.0 g/dL   RDW 66.4 (H) 40.3 - 47.4 %   Platelets 205.0 150.0 - 400.0 K/uL   Neutrophils Relative % 51.1 43.0 - 77.0 %   Lymphocytes Relative 31.8 12.0 - 46.0 %   Monocytes Relative 13.2 (H) 3.0 - 12.0 %   Eosinophils Relative 3.4 0.0 - 5.0 %   Basophils Relative 0.5 0.0 - 3.0 %   Neutro Abs 4.1 1.4 - 7.7 K/uL   Lymphs Abs 2.5 0.7 - 4.0 K/uL   Monocytes Absolute 1.1 (H) 0.1 - 1.0 K/uL   Eosinophils Absolute 0.3 0.0 - 0.7 K/uL   Basophils Absolute  0.0 0.0 - 0.1 K/uL  Comprehensive metabolic panel     Status: Abnormal   Collection Time: 06/09/23  9:31 AM  Result Value Ref Range   Sodium 139 135 - 145 mEq/L   Potassium 3.8 3.5 - 5.1 mEq/L   Chloride 101 96 - 112 mEq/L   CO2 29 19 - 32 mEq/L   Glucose, Bld 152 (H) 70 - 99 mg/dL   BUN 15 6 - 23 mg/dL   Creatinine, Ser 4.78 0.40 - 1.50 mg/dL   Total Bilirubin 0.7 0.2 - 1.2 mg/dL   Alkaline Phosphatase 56 39 - 117 U/L   AST 13 0 - 37 U/L   ALT 19 0 - 53 U/L   Total Protein 6.6 6.0 - 8.3 g/dL   Albumin 4.3 3.5 - 5.2 g/dL   GFR 295.62 >13.08 mL/min    Comment: Calculated using the CKD-EPI Creatinine Equation (2021)   Calcium 9.7 8.4 - 10.5 mg/dL  Lipid panel     Status: Abnormal   Collection Time: 06/09/23  9:31 AM  Result Value Ref Range   Cholesterol 195 0 - 200 mg/dL    Comment: ATP III Classification       Desirable:  < 200 mg/dL               Borderline High:  200 - 239 mg/dL          High:  > = 657 mg/dL   Triglycerides 846.9 0.0 - 149.0 mg/dL    Comment: Normal:  <629 mg/dLBorderline High:  150 - 199 mg/dL   HDL 52.84 >13.24 mg/dL   VLDL 40.1 0.0 - 02.7 mg/dL   LDL Cholesterol 253 (H) 0 - 99 mg/dL   Total CHOL/HDL Ratio 4     Comment:                Men          Women1/2 Average Risk     3.4          3.3Average Risk          5.0          4.42X Average Risk          9.6          7.13X Average Risk          15.0          11.0                        NonHDL 151.04     Comment: NOTE:  Non-HDL goal should be 30 mg/dL higher than patient's LDL goal (i.e. LDL goal of < 70 mg/dL, would have non-HDL goal of < 100 mg/dL)  Microalbumin / creatinine urine ratio     Status: None   Collection Time: 06/09/23  9:31 AM  Result Value Ref Range   Microalb, Ur <0.7 0.0 - 1.9 mg/dL   Creatinine,U 66.4 mg/dL   Microalb Creat Ratio 0.8 0.0 - 30.0 mg/g    No image results found.   No results found.     This encounter employed real-time, collaborative documentation. The patient actively reviewed and updated their medical record on a shared screen, ensuring transparency and facilitating joint problem-solving for the problem list, overview, and plan. This approach promotes accurate, informed care. The treatment plan was discussed and reviewed in detail, including medication safety, potential side effects, and all patient questions. We confirmed understanding and comfort with the plan.  Follow-up instructions were established, including contacting the office for any concerns, returning if symptoms worsen, persist, or new symptoms develop, and precautions for potential emergency department visits. ----------------------------------------------------- Lula Olszewski, MD  06/09/2023 4:20 PM  [facility]:  [phone]

## 2023-06-09 NOTE — Assessment & Plan Note (Signed)
Diabetes is currently poorly controlled, based on available Hemoglobin A1c and glucose monitoring data listed in problem overview.  Diabetic education: ongoing education regarding chronic disease management for diabetes was given today. We continue to reinforce the ABC's of diabetic management: A1c (<7 or 8 dependent upon patient), tight blood pressure control, and cholesterol management with goal LDL < 100 minimally. We discuss diet strategies, exercise recommendations, medication options and possible side effects. At each visit, we review recommended immunizations and preventive care recommendations for diabetics and stress that good diabetic control can prevent other problems.  Importance of regular foot checks and yearly eye exams has been reinforced and is included here for a reminder  Diabetic Medications-Plan for today Increase metformin from 500->1000 twice daily with meals he didn't appreciate that prior due to language barrier Try to get farxiga to protect kidneys   Note the adjustments from prior.  These are also available for review on AVS

## 2023-06-09 NOTE — Patient Instructions (Signed)
It was a pleasure seeing you today! Your health and satisfaction are our top priorities.   Glenetta Hew, MD  Next Steps:  [x]  Early Intervention: Schedule sooner appointment, call our on-call services, or go to emergency room if there is Increase in pain or discomfort New or worsening symptoms Sudden or severe changes in your health [x]  Flexible Follow-Up: We recommend a No follow-ups on file. for optimal routine care. This allows for progress monitoring and treatment adjustments. [x]  Preventive Care: Schedule your annual preventive care visit! It's typically covered by insurance and helps identify potential health issues early. [x]  Lab & X-ray Appointments: Incomplete tests scheduled today, or call to schedule. X-rays: [facility] (M-F, 8:30am-noon or 1pm-5pm). [x]  Medical Information Release: Sign a release form at front desk to obtain relevant medical information we don't have.  Making the Most of Our Focused (20 minute) Appointments:  [x]   Clearly state your top concerns at the beginning of the visit to focus our discussion [x]   If you anticipate you will need more time, please inform the front desk during scheduling - we can book multiple appointments in the same week. [x]   If you have transportation problems- use our convenient video appointments or ask about transportation support. [x]   We can get down to business faster if you use MyChart to update information before the visit and submit non-urgent questions before your visit. Thank you for taking the time to provide details through MyChart.  Let our nurse know and she can import this information into your encounter documents.  Arrival and Wait Times: [x]   Arriving on time ensures that everyone receives prompt attention. [x]   Early morning (8a) and afternoon (1p) appointments tend to have shortest wait times. [x]   Unfortunately, we cannot delay appointments for late arrivals or hold slots during phone  calls.  Getting Answers and Following Up  [x]   Simple Questions & Concerns: For quick questions or basic follow-up after your visit, reach [name] at [phone] or MyChart messaging. [x]   Complex Concerns: If your concern is more complex, scheduling an appointment might be best. Discuss this with the staff to find the most suitable option. [x]   Lab & Imaging Results: We'll contact you directly if results are abnormal or you don't use MyChart. Most normal results will be on MyChart within 2-3 business days, with a review message from Dr. Jon Billings. Haven't heard back in 2 weeks? Need results sooner? Contact us at [phone]. [x]   Referrals: Our referral coordinator will manage specialist referrals. The specialist's office should contact you within 2 weeks to schedule an appointment. Call us if you haven't heard from them after 2 weeks.  Staying Connected  [x]   MyChart: Activate your MyChart for the fastest way to access results and message [name]. See the last page of this paperwork for instructions on how to activate.  Bring to Your Next Appointment  [x]   Medications: Please bring all your medication bottles to your next appointment to ensure we have an accurate record of your prescriptions. [x]   Health Diaries: If you're monitoring any health conditions at home, keeping a diary of your readings can be very helpful for discussions at your next appointment.  Billing  [x]   X-ray & Lab Orders: These are billed by separate companies. Contact the invoicing company directly for questions or concerns. [x]   Visit Charges: Discuss any billing inquiries with our administrative services team.  Your Satisfaction Matters  [x]   Share Your Experience: We strive for your satisfaction!  If you have any complaints, or preferably compliments, please let Dr. Jon Billings know directly or contact our Practice Administrators, Edwena Felty or [employer], by asking at the front desk.   Reviewing Your Records  [x]    Review this early draft of your clinical encounter notes below and the final encounter summary tomorrow on MyChart after its been completed.   Blurry vision -     Ambulatory referral to Ophthalmology  History of dental problems  Language barrier affecting health care  Type 2 diabetes mellitus with hyperglycemia, without long-term current use of insulin (HCC) Assessment & Plan: Diabetes is currently poorly controlled, based on available Hemoglobin A1c and glucose monitoring data listed in problem overview.  Diabetic education: ongoing education regarding chronic disease management for diabetes was given today. We continue to reinforce the ABC's of diabetic management: A1c (<7 or 8 dependent upon patient), tight blood pressure control, and cholesterol management with goal LDL < 100 minimally. We discuss diet strategies, exercise recommendations, medication options and possible side effects. At each visit, we review recommended immunizations and preventive care recommendations for diabetics and stress that good diabetic control can prevent other problems.  Importance of regular foot checks and yearly eye exams has been reinforced and is included here for a reminder  Diabetic Medications-Plan for today Increase metformin from 500->1000 twice daily with meals he didn't appreciate that prior due to language barrier Try to get farxiga to protect kidneys   Note the adjustments from prior.  These are also available for review on AVS   Orders: -     Ambulatory referral to Ophthalmology -     CBC with Differential/Platelet -     Comprehensive metabolic panel -     Lipid panel -     Hemoglobin A1c -     Dapagliflozin Propanediol; Take 1 tablet (10 mg total) by mouth daily before breakfast.  Dispense: 90 tablet; Refill: 3 -     Microalbumin / creatinine urine ratio  Screening for malignant neoplasm of colon -     Cologuard  Type 2 diabetes mellitus with hyperglycemia, with long-term current use  of insulin (HCC) Assessment & Plan: Diabetes is currently poorly controlled, based on available Hemoglobin A1c and glucose monitoring data listed in problem overview.  Diabetic education: ongoing education regarding chronic disease management for diabetes was given today. We continue to reinforce the ABC's of diabetic management: A1c (<7 or 8 dependent upon patient), tight blood pressure control, and cholesterol management with goal LDL < 100 minimally. We discuss diet strategies, exercise recommendations, medication options and possible side effects. At each visit, we review recommended immunizations and preventive care recommendations for diabetics and stress that good diabetic control can prevent other problems.  Importance of regular foot checks and yearly eye exams has been reinforced and is included here for a reminder  Diabetic Medications-Plan for today Increase metformin from 500->1000 twice daily with meals he didn't appreciate that prior due to language barrier Try to get farxiga to protect kidneys   Note the adjustments from prior.  These are also available for review on AVS   Orders: -     metFORMIN HCl; Take 2 tablets (1,000 mg total) by mouth 2 (two) times daily with a meal. Note dose increased. Replaces 500 mg twice daily.  Dispense: 180 tablet; Refill: 3

## 2023-06-10 ENCOUNTER — Other Ambulatory Visit: Payer: Self-pay | Admitting: Internal Medicine

## 2023-06-10 ENCOUNTER — Other Ambulatory Visit: Payer: Self-pay

## 2023-06-10 DIAGNOSIS — E876 Hypokalemia: Secondary | ICD-10-CM

## 2023-06-10 LAB — HEMOGLOBIN A1C
Hgb A1c MFr Bld: 8.1 % of total Hgb — ABNORMAL HIGH (ref <5.7)
Mean Plasma Glucose: 186 mg/dL
eAG (mmol/L): 10.3 mmol/L

## 2023-06-11 ENCOUNTER — Encounter: Payer: Self-pay | Admitting: Hematology

## 2023-06-12 LAB — MULTIPLE MYELOMA PANEL, SERUM
Albumin SerPl Elph-Mcnc: 3.5 g/dL (ref 2.9–4.4)
Albumin/Glob SerPl: 1.4 (ref 0.7–1.7)
Alpha 1: 0.1 g/dL (ref 0.0–0.4)
Alpha2 Glob SerPl Elph-Mcnc: 1 g/dL (ref 0.4–1.0)
B-Globulin SerPl Elph-Mcnc: 0.9 g/dL (ref 0.7–1.3)
Gamma Glob SerPl Elph-Mcnc: 0.5 g/dL (ref 0.4–1.8)
Globulin, Total: 2.6 g/dL (ref 2.2–3.9)
IgA: 138 mg/dL (ref 90–386)
IgG (Immunoglobin G), Serum: 693 mg/dL (ref 603–1613)
IgM (Immunoglobulin M), Srm: 27 mg/dL (ref 20–172)
Total Protein ELP: 6.1 g/dL (ref 6.0–8.5)

## 2023-06-12 NOTE — Progress Notes (Signed)
I reviewed the results of this test. I correlated the findings with the patient's recent encounter discussion and exam. The findings were consistent with the patient's diagnosis of uncontrolled diabetes. An explanation of the results and any necessary follow-up plan have been communicated to the patient via MyChart.

## 2023-06-19 ENCOUNTER — Other Ambulatory Visit: Payer: BC Managed Care – PPO

## 2023-06-19 ENCOUNTER — Ambulatory Visit: Payer: BC Managed Care – PPO

## 2023-06-19 ENCOUNTER — Ambulatory Visit: Payer: BC Managed Care – PPO | Admitting: Hematology

## 2023-06-24 ENCOUNTER — Other Ambulatory Visit: Payer: Self-pay | Admitting: Hematology

## 2023-06-24 DIAGNOSIS — C9000 Multiple myeloma not having achieved remission: Secondary | ICD-10-CM

## 2023-07-03 ENCOUNTER — Other Ambulatory Visit: Payer: Self-pay

## 2023-07-03 ENCOUNTER — Inpatient Hospital Stay: Payer: BC Managed Care – PPO | Attending: Hematology

## 2023-07-03 ENCOUNTER — Inpatient Hospital Stay: Payer: BC Managed Care – PPO

## 2023-07-03 VITALS — BP 138/78 | HR 74 | Temp 98.7°F | Resp 20 | Wt 178.2 lb

## 2023-07-03 DIAGNOSIS — Z79899 Other long term (current) drug therapy: Secondary | ICD-10-CM | POA: Insufficient documentation

## 2023-07-03 DIAGNOSIS — C9000 Multiple myeloma not having achieved remission: Secondary | ICD-10-CM | POA: Insufficient documentation

## 2023-07-03 DIAGNOSIS — C9001 Multiple myeloma in remission: Secondary | ICD-10-CM

## 2023-07-03 DIAGNOSIS — Z7189 Other specified counseling: Secondary | ICD-10-CM

## 2023-07-03 DIAGNOSIS — Z5112 Encounter for antineoplastic immunotherapy: Secondary | ICD-10-CM | POA: Diagnosis not present

## 2023-07-03 LAB — CBC WITH DIFFERENTIAL (CANCER CENTER ONLY)
Abs Immature Granulocytes: 0.02 10*3/uL (ref 0.00–0.07)
Basophils Absolute: 0.1 10*3/uL (ref 0.0–0.1)
Basophils Relative: 1 %
Eosinophils Absolute: 0.2 10*3/uL (ref 0.0–0.5)
Eosinophils Relative: 2 %
HCT: 42.6 % (ref 39.0–52.0)
Hemoglobin: 14 g/dL (ref 13.0–17.0)
Immature Granulocytes: 0 %
Lymphocytes Relative: 31 %
Lymphs Abs: 2.5 10*3/uL (ref 0.7–4.0)
MCH: 25.9 pg — ABNORMAL LOW (ref 26.0–34.0)
MCHC: 32.9 g/dL (ref 30.0–36.0)
MCV: 78.7 fL — ABNORMAL LOW (ref 80.0–100.0)
Monocytes Absolute: 0.8 10*3/uL (ref 0.1–1.0)
Monocytes Relative: 10 %
Neutro Abs: 4.6 10*3/uL (ref 1.7–7.7)
Neutrophils Relative %: 56 %
Platelet Count: 181 10*3/uL (ref 150–400)
RBC: 5.41 MIL/uL (ref 4.22–5.81)
RDW: 15.9 % — ABNORMAL HIGH (ref 11.5–15.5)
WBC Count: 8.2 10*3/uL (ref 4.0–10.5)
nRBC: 0 % (ref 0.0–0.2)

## 2023-07-03 LAB — COMPREHENSIVE METABOLIC PANEL
ALT: 15 U/L (ref 0–44)
AST: 11 U/L — ABNORMAL LOW (ref 15–41)
Albumin: 3.9 g/dL (ref 3.5–5.0)
Alkaline Phosphatase: 50 U/L (ref 38–126)
Anion gap: 9 (ref 5–15)
BUN: 13 mg/dL (ref 6–20)
CO2: 27 mmol/L (ref 22–32)
Calcium: 9.3 mg/dL (ref 8.9–10.3)
Chloride: 103 mmol/L (ref 98–111)
Creatinine, Ser: 0.86 mg/dL (ref 0.61–1.24)
GFR, Estimated: >60 mL/min (ref >60)
Glucose, Bld: 166 mg/dL — ABNORMAL HIGH (ref 70–99)
Potassium: 3.4 mmol/L — ABNORMAL LOW (ref 3.5–5.1)
Sodium: 139 mmol/L (ref 135–145)
Total Bilirubin: 0.5 mg/dL (ref 0.3–1.2)
Total Protein: 6 g/dL — ABNORMAL LOW (ref 6.5–8.1)

## 2023-07-03 MED ORDER — DIPHENHYDRAMINE HCL 25 MG PO CAPS
50.0000 mg | ORAL_CAPSULE | Freq: Once | ORAL | Status: AC
  Administered 2023-07-03: 50 mg via ORAL
  Filled 2023-07-03: qty 2

## 2023-07-03 MED ORDER — SODIUM CHLORIDE 0.9 % IV SOLN: Freq: Once | INTRAVENOUS | Status: AC

## 2023-07-03 MED ORDER — SODIUM CHLORIDE 0.9 % IV SOLN
16.0000 mg/kg | Freq: Once | INTRAVENOUS | Status: AC
  Administered 2023-07-03: 1100 mg via INTRAVENOUS
  Filled 2023-07-03: qty 15

## 2023-07-03 MED ORDER — MONTELUKAST SODIUM 10 MG PO TABS
10.0000 mg | ORAL_TABLET | Freq: Once | ORAL | Status: AC
  Administered 2023-07-03: 10 mg via ORAL
  Filled 2023-07-03: qty 1

## 2023-07-03 MED ORDER — FAMOTIDINE IN NACL 20-0.9 MG/50ML-% IV SOLN
20.0000 mg | Freq: Once | INTRAVENOUS | Status: AC
  Administered 2023-07-03: 20 mg via INTRAVENOUS
  Filled 2023-07-03: qty 50

## 2023-07-03 MED ORDER — DEXAMETHASONE SODIUM PHOSPHATE 10 MG/ML IJ SOLN
6.0000 mg | Freq: Once | INTRAMUSCULAR | Status: AC
  Administered 2023-07-03: 6 mg via INTRAVENOUS
  Filled 2023-07-03: qty 1

## 2023-07-03 MED ORDER — ACETAMINOPHEN 325 MG PO TABS
650.0000 mg | ORAL_TABLET | Freq: Once | ORAL | Status: AC
  Administered 2023-07-03: 650 mg via ORAL
  Filled 2023-07-03: qty 2

## 2023-07-03 MED ORDER — ZOLEDRONIC ACID 4 MG/100ML IV SOLN
4.0000 mg | Freq: Once | INTRAVENOUS | Status: AC
  Administered 2023-07-03: 4 mg via INTRAVENOUS
  Filled 2023-07-03: qty 100

## 2023-07-03 NOTE — Patient Instructions (Signed)
[facility]  Discharge Instructions: Thank you for choosing [facility] to provide your oncology and hematology care.   If you have a lab appointment with the Cancer Center, please go directly to the Cancer Center and check in at the registration area.   Wear comfortable clothing and clothing appropriate for easy access to any Portacath or PICC line.   We strive to give you quality time with your provider. You may need to reschedule your appointment if you arrive late (15 or more minutes).  Arriving late affects you and other patients whose appointments are after yours.  Also, if you miss three or more appointments without notifying the office, you may be dismissed from the clinic at the provider's discretion.      For prescription refill requests, have your pharmacy contact our office and allow 72 hours for refills to be completed.    Today you received the following chemotherapy and/or immunotherapy agents: Daratumumab.       To help prevent nausea and vomiting after your treatment, we encourage you to take your nausea medication as directed.  BELOW ARE SYMPTOMS THAT SHOULD BE REPORTED IMMEDIATELY: *FEVER GREATER THAN 100.4 F (38 C) OR HIGHER *CHILLS OR SWEATING *NAUSEA AND VOMITING THAT IS NOT CONTROLLED WITH YOUR NAUSEA MEDICATION *UNUSUAL SHORTNESS OF BREATH *UNUSUAL BRUISING OR BLEEDING *URINARY PROBLEMS (pain or burning when urinating, or frequent urination) *BOWEL PROBLEMS (unusual diarrhea, constipation, pain near the anus) TENDERNESS IN MOUTH AND THROAT WITH OR WITHOUT PRESENCE OF ULCERS (sore throat, sores in mouth, or a toothache) UNUSUAL RASH, SWELLING OR PAIN  UNUSUAL VAGINAL DISCHARGE OR ITCHING   Items with * indicate a potential emergency and should be followed up as soon as possible or go to the Emergency Department if any problems should occur.  Please show the CHEMOTHERAPY ALERT CARD or IMMUNOTHERAPY ALERT CARD at  check-in to the Emergency Department and triage nurse.  Should you have questions after your visit or need to cancel or reschedule your appointment, please contact [facility]  Dept: [phone]  and follow the prompts.  Office hours are 8:00 a.m. to 4:30 p.m. Monday - Friday. Please note that voicemails left after 4:00 p.m. may not be returned until the following business day.  We are closed weekends and major holidays. You have access to a nurse at all times for urgent questions. Please call the main number to the clinic Dept: [phone] and follow the prompts.   For any non-urgent questions, you may also contact your provider using MyChart. We now offer e-Visits for anyone 18 and older to request care online for non-urgent symptoms. For details visit [email or web address].   Also download the MyChart app! Go to the app store, search "MyChart", open the app, select [facility], and log in with your MyChart username and password.   

## 2023-07-03 NOTE — Progress Notes (Signed)
Pt given Revlimid rx on 07/03/23 Pt to start taking on 07/04/23 and take for 21 days:07/04/23 - 07/24/23. Pt will take 7 days off from 07/25/23 - 07/31/23. Pt will restart next cycle on 08/01/23.

## 2023-07-03 NOTE — Progress Notes (Signed)
OK to keep Daratumumab at dose as ordered despite updated weight per Georga Kaufmann, PA.  Ebony Hail, Pharm.D., CPP 07/03/2023@1 :50 PM

## 2023-07-08 LAB — MULTIPLE MYELOMA PANEL, SERUM
Albumin SerPl Elph-Mcnc: 3.6 g/dL (ref 2.9–4.4)
Albumin/Glob SerPl: 1.6 (ref 0.7–1.7)
Alpha 1: 0.1 g/dL (ref 0.0–0.4)
Alpha2 Glob SerPl Elph-Mcnc: 0.9 g/dL (ref 0.4–1.0)
B-Globulin SerPl Elph-Mcnc: 0.9 g/dL (ref 0.7–1.3)
Gamma Glob SerPl Elph-Mcnc: 0.5 g/dL (ref 0.4–1.8)
Globulin, Total: 2.4 g/dL (ref 2.2–3.9)
IgA: 113 mg/dL (ref 90–386)
IgG (Immunoglobin G), Serum: 652 mg/dL (ref 603–1613)
IgM (Immunoglobulin M), Srm: 27 mg/dL (ref 20–172)
M Protein SerPl Elph-Mcnc: 0.1 g/dL — ABNORMAL HIGH
Total Protein ELP: 6 g/dL (ref 6.0–8.5)

## 2023-07-21 ENCOUNTER — Other Ambulatory Visit: Payer: Self-pay | Admitting: Hematology

## 2023-07-21 DIAGNOSIS — C9000 Multiple myeloma not having achieved remission: Secondary | ICD-10-CM

## 2023-07-27 ENCOUNTER — Other Ambulatory Visit: Payer: Self-pay

## 2023-07-27 ENCOUNTER — Encounter: Payer: Self-pay | Admitting: Hematology

## 2023-07-31 ENCOUNTER — Inpatient Hospital Stay: Payer: BC Managed Care – PPO | Attending: Hematology

## 2023-07-31 ENCOUNTER — Inpatient Hospital Stay (HOSPITAL_BASED_OUTPATIENT_CLINIC_OR_DEPARTMENT_OTHER): Payer: BC Managed Care – PPO | Admitting: Physician Assistant

## 2023-07-31 ENCOUNTER — Inpatient Hospital Stay: Payer: BC Managed Care – PPO

## 2023-07-31 ENCOUNTER — Other Ambulatory Visit: Payer: Self-pay

## 2023-07-31 ENCOUNTER — Ambulatory Visit: Payer: BC Managed Care – PPO | Admitting: Hematology

## 2023-07-31 VITALS — BP 133/82 | HR 76 | Temp 98.0°F | Resp 15 | Wt 177.4 lb

## 2023-07-31 VITALS — BP 113/77 | HR 72 | Temp 97.5°F | Resp 18

## 2023-07-31 DIAGNOSIS — G629 Polyneuropathy, unspecified: Secondary | ICD-10-CM | POA: Diagnosis not present

## 2023-07-31 DIAGNOSIS — Z7984 Long term (current) use of oral hypoglycemic drugs: Secondary | ICD-10-CM | POA: Diagnosis not present

## 2023-07-31 DIAGNOSIS — Z5111 Encounter for antineoplastic chemotherapy: Secondary | ICD-10-CM | POA: Insufficient documentation

## 2023-07-31 DIAGNOSIS — Z7189 Other specified counseling: Secondary | ICD-10-CM | POA: Diagnosis not present

## 2023-07-31 DIAGNOSIS — D649 Anemia, unspecified: Secondary | ICD-10-CM | POA: Insufficient documentation

## 2023-07-31 DIAGNOSIS — C9001 Multiple myeloma in remission: Secondary | ICD-10-CM

## 2023-07-31 DIAGNOSIS — E1140 Type 2 diabetes mellitus with diabetic neuropathy, unspecified: Secondary | ICD-10-CM | POA: Insufficient documentation

## 2023-07-31 DIAGNOSIS — C9000 Multiple myeloma not having achieved remission: Secondary | ICD-10-CM | POA: Insufficient documentation

## 2023-07-31 DIAGNOSIS — N289 Disorder of kidney and ureter, unspecified: Secondary | ICD-10-CM | POA: Insufficient documentation

## 2023-07-31 LAB — COMPREHENSIVE METABOLIC PANEL
ALT: 24 U/L (ref 0–44)
AST: 12 U/L — ABNORMAL LOW (ref 15–41)
Albumin: 3.8 g/dL (ref 3.5–5.0)
Alkaline Phosphatase: 72 U/L (ref 38–126)
Anion gap: 8 (ref 5–15)
BUN: 14 mg/dL (ref 6–20)
CO2: 25 mmol/L (ref 22–32)
Calcium: 8.4 mg/dL — ABNORMAL LOW (ref 8.9–10.3)
Chloride: 104 mmol/L (ref 98–111)
Creatinine, Ser: 0.65 mg/dL (ref 0.61–1.24)
GFR, Estimated: >60 mL/min (ref >60)
Glucose, Bld: 193 mg/dL — ABNORMAL HIGH (ref 70–99)
Potassium: 3.5 mmol/L (ref 3.5–5.1)
Sodium: 137 mmol/L (ref 135–145)
Total Bilirubin: 0.6 mg/dL (ref 0.3–1.2)
Total Protein: 6.6 g/dL (ref 6.5–8.1)

## 2023-07-31 LAB — CBC WITH DIFFERENTIAL (CANCER CENTER ONLY)
Abs Immature Granulocytes: 0.03 10*3/uL (ref 0.00–0.07)
Basophils Absolute: 0.1 10*3/uL (ref 0.0–0.1)
Basophils Relative: 1 %
Eosinophils Absolute: 0.2 10*3/uL (ref 0.0–0.5)
Eosinophils Relative: 2 %
HCT: 40.9 % (ref 39.0–52.0)
Hemoglobin: 13.5 g/dL (ref 13.0–17.0)
Immature Granulocytes: 0 %
Lymphocytes Relative: 29 %
Lymphs Abs: 2.7 10*3/uL (ref 0.7–4.0)
MCH: 25.3 pg — ABNORMAL LOW (ref 26.0–34.0)
MCHC: 33 g/dL (ref 30.0–36.0)
MCV: 76.7 fL — ABNORMAL LOW (ref 80.0–100.0)
Monocytes Absolute: 1.2 10*3/uL — ABNORMAL HIGH (ref 0.1–1.0)
Monocytes Relative: 13 %
Neutro Abs: 5.1 10*3/uL (ref 1.7–7.7)
Neutrophils Relative %: 55 %
Platelet Count: 239 10*3/uL (ref 150–400)
RBC: 5.33 MIL/uL (ref 4.22–5.81)
RDW: 15.1 % (ref 11.5–15.5)
WBC Count: 9.4 10*3/uL (ref 4.0–10.5)
nRBC: 0 % (ref 0.0–0.2)

## 2023-07-31 MED ORDER — DIPHENHYDRAMINE HCL 25 MG PO CAPS
50.0000 mg | ORAL_CAPSULE | Freq: Once | ORAL | Status: AC
  Administered 2023-07-31: 50 mg via ORAL
  Filled 2023-07-31: qty 2

## 2023-07-31 MED ORDER — FAMOTIDINE IN NACL 20-0.9 MG/50ML-% IV SOLN
20.0000 mg | Freq: Once | INTRAVENOUS | Status: AC
  Administered 2023-07-31: 20 mg via INTRAVENOUS
  Filled 2023-07-31: qty 50

## 2023-07-31 MED ORDER — MONTELUKAST SODIUM 10 MG PO TABS
10.0000 mg | ORAL_TABLET | Freq: Once | ORAL | Status: AC
  Administered 2023-07-31: 10 mg via ORAL
  Filled 2023-07-31: qty 1

## 2023-07-31 MED ORDER — DEXAMETHASONE SODIUM PHOSPHATE 10 MG/ML IJ SOLN
6.0000 mg | Freq: Once | INTRAMUSCULAR | Status: AC
  Administered 2023-07-31: 6 mg via INTRAVENOUS
  Filled 2023-07-31: qty 1

## 2023-07-31 MED ORDER — SODIUM CHLORIDE 0.9 % IV SOLN
16.0000 mg/kg | Freq: Once | INTRAVENOUS | Status: AC
  Administered 2023-07-31: 1300 mg via INTRAVENOUS
  Filled 2023-07-31: qty 5

## 2023-07-31 MED ORDER — ACETAMINOPHEN 325 MG PO TABS
650.0000 mg | ORAL_TABLET | Freq: Once | ORAL | Status: AC
  Administered 2023-07-31: 650 mg via ORAL
  Filled 2023-07-31: qty 2

## 2023-07-31 MED ORDER — ZOLEDRONIC ACID 4 MG/100ML IV SOLN: 4.0000 mg | Freq: Once | INTRAVENOUS | Status: DC

## 2023-07-31 MED ORDER — SODIUM CHLORIDE 0.9 % IV SOLN: Freq: Once | INTRAVENOUS | Status: AC

## 2023-07-31 NOTE — Progress Notes (Signed)
Donald Suarez Kitchen  HEMATOLOGY/ONCOLOGY CLINIC NOTE  Date of Service: .07/31/2023  Chief complaint - Follow-up for continued evaluation and management of high risk multiple myeloma  Current treatment-daratumumab/Revlimid/dexamethasone  INTERVAL HISTORY:  Donald Suarez is a 57 y.o. male is here with his Donald Suarez interpreter for continued evaluation and management of his multiple myeloma. He was last seen by Dr. Candise Che on 06/05/2023. He reports that he is tolerating therapy without any significant limitations.   [name] has stable energy levels and able to complete all his ADLs on his own. He denies nausea, vomiting or bowel habit changes. He denies easy bruising or signs of active bleeding. He has stable neuropathy which does not affect his grip or balance. He denies fevers, chills, sweats, shortness of breath, chest pain or cough. He has no other complaints.    MEDICAL HISTORY:  Past Medical History:  Diagnosis Date   Abfraction 08/06/2022   Accretions on teeth 08/06/2022   Anemia    Attrition, teeth excessive 08/06/2022   Caries 08/06/2022   Defective dental restoration 08/06/2022   Elevated ferritin 08/07/2022   Hypocalcemia 08/19/2022   In setting of multiple myeloma    Loose, teeth 08/06/2022   Malocclusion 08/06/2022   Microalbuminuria 08/19/2022   Periodontal disease 08/06/2022   Peripheral neuropathy 09/08/2022   Started 08/2022 A/w diabetes and revlimid usage Burning on top of left foot   Teeth missing 08/06/2022    SURGICAL HISTORY: Past Surgical History:  Procedure Laterality Date   APPENDECTOMY     [name] BONE MARROW BIOPSY & ASPIRATION  02/11/2023   [name] FLUORO GUIDE CV LINE RIGHT  05/15/2022   [name] PATIENT EVAL TECH 0-60 MINS  05/19/2022   [name] US GUIDE VASC ACCESS RIGHT  05/15/2022    SOCIAL HISTORY: Social History   Socioeconomic History   Marital status: Married    Spouse name: Not on file   Number of children: Not on file   Years of education: Not on file   Highest education  level: Not on file  Occupational History   Not on file  Tobacco Use   Smoking status: Never   Smokeless tobacco: Never  Vaping Use   Vaping status: Never Used  Substance and Sexual Activity   Alcohol use: Never   Drug use: Never   Sexual activity: Not on file  Other Topics Concern   Not on file  Social History Narrative   Not on file   Social Determinants of Health   Financial Resource Strain: Medium Risk (05/21/2022)   Overall Financial Resource Strain (CARDIA)    Difficulty of Paying Living Expenses: Somewhat hard  Food Insecurity: Food Insecurity Present (05/21/2022)   Hunger Vital Sign    Worried About Running Out of Food in the Last Year: Sometimes true    Ran Out of Food in the Last Year: Sometimes true  Transportation Needs: No Transportation Needs (05/21/2022)   PRAPARE - Administrator, Civil Service (Medical): No    Lack of Transportation (Non-Medical): No  Physical Activity: Not on file  Stress: Not on file  Social Connections: Not on file  Intimate Partner Violence: Not on file    FAMILY HISTORY: No family history on file.  ALLERGIES:  has No Known Allergies.  MEDICATIONS:  . Allergies as of 07/31/2023   No Known Allergies      Medication List        Accurate as of July 31, 2023 11:09 AM. If you have any questions, ask your nurse  or doctor.          Accu-Chek Guide test strip Generic drug: glucose blood Use to test blood sugars up to 4 times daily as directed.   Accu-Chek Guide w/Device Kit Use to test blood sugars up to 4 times daily as directed.   Accu-Chek Softclix Lancets lancets Use to test blood sugars up to 4 times daily as directed.   acyclovir 400 MG tablet Commonly known as: ZOVIRAX Take 1 tablet (400 mg total) by mouth 2 (two) times daily.   aspirin EC 81 MG tablet Take 1 tablet (81 mg total) by mouth daily. Swallow whole.   B-12 1000 MCG Caps Take 1 tablet by mouth daily.   dapagliflozin propanediol 10 MG  Tabs tablet Commonly known as: Farxiga Take 1 tablet (10 mg total) by mouth daily before breakfast.   FreeStyle Libre 3 Sensor Misc 1 Application by Does not apply route every 14 (fourteen) days. Place 1 sensor on the skin every 14 days. Use to check glucose continuously   gabapentin 300 MG capsule Commonly known as: NEURONTIN Take 1 capsule (300 mg total) by mouth 3 (three) times daily.   Klor-Con M20 20 MEQ tablet Generic drug: potassium chloride SA TAKE 1 TABLET BY MOUTH EVERY DAY   lenalidomide 25 MG capsule Commonly known as: REVLIMID TAKE 1 CAPSULE BY MOUTH 1 TIME A DAY FOR 21 DAYS ON THEN 7 DAYS OFF   metFORMIN 500 MG tablet Commonly known as: GLUCOPHAGE Take 2 tablets (1,000 mg total) by mouth 2 (two) times daily with a meal. Note dose increased. Replaces 500 mg twice daily.   ondansetron 8 MG tablet Commonly known as: Zofran Take 1 tablet (8 mg total) by mouth every 8 (eight) hours as needed for nausea or vomiting.   prochlorperazine 10 MG tablet Commonly known as: COMPAZINE Take 1 tablet (10 mg total) by mouth every 6 (six) hours as needed for nausea or vomiting.         REVIEW OF SYSTEMS:   10 Point review of Systems was done is negative except as noted above.   PHYSICAL EXAMINATION: Vitals:   07/31/23 1052  BP: 133/82  Pulse: 76  Resp: 15  Temp: 98 F (36.7 C)  SpO2: 99%   GENERAL:alert, in no acute distress and comfortable SKIN: no acute rashes, no significant lesions EYES: conjunctiva are pink and non-injected, sclera anicteric LUNGS: clear to auscultation b/l with normal respiratory effort HEART: regular rate & rhythm Extremity: no pedal edema PSYCH: alert & oriented x 3 with fluent speech NEURO: no focal motor/sensory deficits     LABORATORY DATA:  I have reviewed the data as listed  .    Latest Ref Rng & Units 07/31/2023   10:21 AM 07/03/2023   12:49 PM 06/09/2023    9:31 AM  CBC  WBC 4.0 - 10.5 K/uL 9.4  8.2  8.0   Hemoglobin 13.0 -  17.0 g/dL 40.9  81.1  91.4   Hematocrit 39.0 - 52.0 % 40.9  42.6  46.7   Platelets 150 - 400 K/uL 239  181  205.0     .    Latest Ref Rng & Units 07/03/2023   12:49 PM 06/09/2023    9:31 AM 06/05/2023    9:08 AM  CMP  Glucose 70 - 99 mg/dL 782  956  213   BUN 6 - 20 mg/dL 13  15  15    Creatinine 0.61 - 1.24 mg/dL 0.86  5.78  4.69  Sodium 135 - 145 mmol/L 139  139  137   Potassium 3.5 - 5.1 mmol/L 3.4  3.8  3.4   Chloride 98 - 111 mmol/L 103  101  102   CO2 22 - 32 mmol/L 27  29  26    Calcium 8.9 - 10.3 mg/dL 9.3  9.7  9.2   Total Protein 6.5 - 8.1 g/dL 6.0  6.6  6.8   Total Bilirubin 0.3 - 1.2 mg/dL 0.5  0.7  0.5   Alkaline Phos 38 - 126 U/L 50  56  57   AST 15 - 41 U/L 11  13  14    ALT 0 - 44 U/L 15  19  20     . Lab Results  Component Value Date   LDH 114 10/10/2022      RADIOGRAPHIC STUDIES: I have personally reviewed the radiological images as listed and agreed with the findings in the report. No results found.  ASSESSMENT & PLAN:   1) R-ISS stage III high risk IgG Lambda Multiple myeloma not on treatment for more than 1 year due to patient's lack of follow-up. -Patient diagnosed December 2021 and received 1 cycle of CyBorD. -Had previously presented with anemia renal insufficiency and hyperviscosity symptoms. -Bone marrow Bx- 90% plasma cells -Cytogenetics showed: Del(1p): Not Detected  Dup(1q): Not Detected  Gains(15): DETECTED  Gains(5 and 9): Not Detected  Del(13q)/-13: DETECTED  Del(17p)(TP53): Not Detected  IGH(Rearrangement): SEE BELOW   IgH complex: t(4;14): DETECTED  t(11;14): Not Detected  t(14;16): Not Detected  t(14;20): Not Detected   Cytogenetics: Normal male karyotype.   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 WBC 9.4, Hgb 13.5, Plt 239 -Most recent  myeloma labs from 07/03/2023 showed M protein measuring 0.1 g/dL -Tolerating Daratumumab/Revlimid/Dex without any prohibitive toxicities.  -Proceed with treatment today without any dose modifications.  -Last Zometa was on 07/03/2023. Next due next treatment due today  Follow-up -RTC in 4 weeks with labs, follow up and next daratumumab treatment   All of the patient's questions were answered with apparent satisfaction. The patient knows to call the clinic with any problems, questions or concerns.  I have spent a total of 30 minutes minutes of face-to-face and non-face-to-face time, preparing to see the patient,  performing a medically appropriate examination, counseling and educating the patient, ordering tests/procedures, documenting clinical information in the electronic health record,and care coordination.   Georga Kaufmann PA-C Dept of Hematology and Oncology [facility] Phone: [phone]

## 2023-07-31 NOTE — Progress Notes (Signed)
Pt given Revlimid rx on 07/31/23 Pt to start taking on 08/01/23 and take for 21 days:08/01/23 - 08/21/23. Pt will take 7 days off from 08/22/23/ - 08/28/23. Pt will restart next cycle on 08/29/23.

## 2023-07-31 NOTE — Progress Notes (Signed)
Per Georga Kaufmann, PA, ok to adj Daratumumab dose w/ pts incr wt.  Ebony Hail, Pharm.D., CPP 07/31/2023@11 :44 AM

## 2023-07-31 NOTE — Progress Notes (Signed)
Per Leonides Schanz MD, ok to hold Zometa today due to corrected calcium of 8.65.

## 2023-07-31 NOTE — Patient Instructions (Signed)
[facility]  Discharge Instructions: Thank you for choosing [facility] to provide your oncology and hematology care.   If you have a lab appointment with the Cancer Center, please go directly to the Cancer Center and check in at the registration area.   Wear comfortable clothing and clothing appropriate for easy access to any Portacath or PICC line.   We strive to give you quality time with your provider. You may need to reschedule your appointment if you arrive late (15 or more minutes).  Arriving late affects you and other patients whose appointments are after yours.  Also, if you miss three or more appointments without notifying the office, you may be dismissed from the clinic at the provider's discretion.      For prescription refill requests, have your pharmacy contact our office and allow 72 hours for refills to be completed.    Today you received the following chemotherapy and/or immunotherapy agents: Daratumumab.       To help prevent nausea and vomiting after your treatment, we encourage you to take your nausea medication as directed.  BELOW ARE SYMPTOMS THAT SHOULD BE REPORTED IMMEDIATELY: *FEVER GREATER THAN 100.4 F (38 C) OR HIGHER *CHILLS OR SWEATING *NAUSEA AND VOMITING THAT IS NOT CONTROLLED WITH YOUR NAUSEA MEDICATION *UNUSUAL SHORTNESS OF BREATH *UNUSUAL BRUISING OR BLEEDING *URINARY PROBLEMS (pain or burning when urinating, or frequent urination) *BOWEL PROBLEMS (unusual diarrhea, constipation, pain near the anus) TENDERNESS IN MOUTH AND THROAT WITH OR WITHOUT PRESENCE OF ULCERS (sore throat, sores in mouth, or a toothache) UNUSUAL RASH, SWELLING OR PAIN  UNUSUAL VAGINAL DISCHARGE OR ITCHING   Items with * indicate a potential emergency and should be followed up as soon as possible or go to the Emergency Department if any problems should occur.  Please show the CHEMOTHERAPY ALERT CARD or IMMUNOTHERAPY ALERT CARD at  check-in to the Emergency Department and triage nurse.  Should you have questions after your visit or need to cancel or reschedule your appointment, please contact [facility]  Dept: [phone]  and follow the prompts.  Office hours are 8:00 a.m. to 4:30 p.m. Monday - Friday. Please note that voicemails left after 4:00 p.m. may not be returned until the following business day.  We are closed weekends and major holidays. You have access to a nurse at all times for urgent questions. Please call the main number to the clinic Dept: [phone] and follow the prompts.   For any non-urgent questions, you may also contact your provider using MyChart. We now offer e-Visits for anyone 18 and older to request care online for non-urgent symptoms. For details visit [email or web address].   Also download the MyChart app! Go to the app store, search "MyChart", open the app, select [facility], and log in with your MyChart username and password.   

## 2023-08-01 ENCOUNTER — Other Ambulatory Visit: Payer: Self-pay

## 2023-08-13 ENCOUNTER — Encounter (INDEPENDENT_AMBULATORY_CARE_PROVIDER_SITE_OTHER): Payer: Self-pay

## 2023-08-18 ENCOUNTER — Other Ambulatory Visit: Payer: Self-pay | Admitting: Hematology

## 2023-08-18 DIAGNOSIS — C9000 Multiple myeloma not having achieved remission: Secondary | ICD-10-CM

## 2023-08-28 ENCOUNTER — Inpatient Hospital Stay: Payer: BC Managed Care – PPO

## 2023-08-28 ENCOUNTER — Other Ambulatory Visit: Payer: Self-pay

## 2023-08-28 ENCOUNTER — Inpatient Hospital Stay (HOSPITAL_BASED_OUTPATIENT_CLINIC_OR_DEPARTMENT_OTHER): Payer: BC Managed Care – PPO | Admitting: Hematology

## 2023-08-28 VITALS — BP 113/68 | HR 81 | Temp 98.7°F | Resp 16

## 2023-08-28 VITALS — BP 135/84 | HR 96 | Temp 98.4°F | Resp 17 | Wt 170.2 lb

## 2023-08-28 DIAGNOSIS — C9001 Multiple myeloma in remission: Secondary | ICD-10-CM | POA: Diagnosis not present

## 2023-08-28 DIAGNOSIS — Z7189 Other specified counseling: Secondary | ICD-10-CM | POA: Diagnosis not present

## 2023-08-28 DIAGNOSIS — Z5111 Encounter for antineoplastic chemotherapy: Secondary | ICD-10-CM

## 2023-08-28 DIAGNOSIS — G609 Hereditary and idiopathic neuropathy, unspecified: Secondary | ICD-10-CM

## 2023-08-28 DIAGNOSIS — C9000 Multiple myeloma not having achieved remission: Secondary | ICD-10-CM | POA: Diagnosis not present

## 2023-08-28 LAB — CBC WITH DIFFERENTIAL (CANCER CENTER ONLY)
Abs Immature Granulocytes: 0.05 10*3/uL (ref 0.00–0.07)
Basophils Absolute: 0.2 10*3/uL — ABNORMAL HIGH (ref 0.0–0.1)
Basophils Relative: 2 %
Eosinophils Absolute: 0.1 10*3/uL (ref 0.0–0.5)
Eosinophils Relative: 1 %
HCT: 39.4 % (ref 39.0–52.0)
Hemoglobin: 13.1 g/dL (ref 13.0–17.0)
Immature Granulocytes: 1 %
Lymphocytes Relative: 25 %
Lymphs Abs: 2.5 10*3/uL (ref 0.7–4.0)
MCH: 25.3 pg — ABNORMAL LOW (ref 26.0–34.0)
MCHC: 33.2 g/dL (ref 30.0–36.0)
MCV: 76.2 fL — ABNORMAL LOW (ref 80.0–100.0)
Monocytes Absolute: 1.2 10*3/uL — ABNORMAL HIGH (ref 0.1–1.0)
Monocytes Relative: 13 %
Neutro Abs: 5.9 10*3/uL (ref 1.7–7.7)
Neutrophils Relative %: 58 %
Platelet Count: 252 10*3/uL (ref 150–400)
RBC: 5.17 MIL/uL (ref 4.22–5.81)
RDW: 15.1 % (ref 11.5–15.5)
WBC Count: 9.9 10*3/uL (ref 4.0–10.5)
nRBC: 0 % (ref 0.0–0.2)

## 2023-08-28 LAB — COMPREHENSIVE METABOLIC PANEL
ALT: 40 U/L (ref 0–44)
AST: 14 U/L — ABNORMAL LOW (ref 15–41)
Albumin: 3.7 g/dL (ref 3.5–5.0)
Alkaline Phosphatase: 111 U/L (ref 38–126)
Anion gap: 10 (ref 5–15)
BUN: 10 mg/dL (ref 6–20)
CO2: 25 mmol/L (ref 22–32)
Calcium: 8.8 mg/dL — ABNORMAL LOW (ref 8.9–10.3)
Chloride: 100 mmol/L (ref 98–111)
Creatinine, Ser: 0.71 mg/dL (ref 0.61–1.24)
GFR, Estimated: >60 mL/min (ref >60)
Glucose, Bld: 317 mg/dL — ABNORMAL HIGH (ref 70–99)
Potassium: 3.5 mmol/L (ref 3.5–5.1)
Sodium: 135 mmol/L (ref 135–145)
Total Bilirubin: 0.6 mg/dL (ref 0.3–1.2)
Total Protein: 7.2 g/dL (ref 6.5–8.1)

## 2023-08-28 MED ORDER — DIPHENHYDRAMINE HCL 25 MG PO CAPS
50.0000 mg | ORAL_CAPSULE | Freq: Once | ORAL | Status: AC
  Administered 2023-08-28: 50 mg via ORAL
  Filled 2023-08-28: qty 2

## 2023-08-28 MED ORDER — GABAPENTIN 300 MG PO CAPS
300.0000 mg | ORAL_CAPSULE | Freq: Three times a day (TID) | ORAL | 5 refills | Status: DC
Start: 2023-08-28 — End: 2024-07-26

## 2023-08-28 MED ORDER — SODIUM CHLORIDE 0.9 % IV SOLN: Freq: Once | INTRAVENOUS | Status: AC

## 2023-08-28 MED ORDER — ZOLEDRONIC ACID 4 MG/100ML IV SOLN
4.0000 mg | Freq: Once | INTRAVENOUS | Status: AC
  Administered 2023-08-28: 4 mg via INTRAVENOUS
  Filled 2023-08-28: qty 100

## 2023-08-28 MED ORDER — MONTELUKAST SODIUM 10 MG PO TABS
10.0000 mg | ORAL_TABLET | Freq: Once | ORAL | Status: AC
  Administered 2023-08-28: 10 mg via ORAL
  Filled 2023-08-28: qty 1

## 2023-08-28 MED ORDER — ACETAMINOPHEN 325 MG PO TABS
650.0000 mg | ORAL_TABLET | Freq: Once | ORAL | Status: AC
  Administered 2023-08-28: 650 mg via ORAL
  Filled 2023-08-28: qty 2

## 2023-08-28 MED ORDER — B-12 1000 MCG PO CAPS
1.0000 | ORAL_CAPSULE | Freq: Every day | ORAL | 3 refills | Status: AC
Start: 2023-08-28 — End: ?

## 2023-08-28 MED ORDER — DEXAMETHASONE SODIUM PHOSPHATE 10 MG/ML IJ SOLN
4.0000 mg | Freq: Once | INTRAMUSCULAR | Status: AC
  Administered 2023-08-28: 4 mg via INTRAVENOUS
  Filled 2023-08-28: qty 1

## 2023-08-28 MED ORDER — ACYCLOVIR 400 MG PO TABS
400.0000 mg | ORAL_TABLET | Freq: Two times a day (BID) | ORAL | 11 refills | Status: AC
Start: 2023-08-28 — End: ?

## 2023-08-28 MED ORDER — FAMOTIDINE IN NACL 20-0.9 MG/50ML-% IV SOLN
20.0000 mg | Freq: Once | INTRAVENOUS | Status: AC
  Administered 2023-08-28: 20 mg via INTRAVENOUS
  Filled 2023-08-28: qty 50

## 2023-08-28 MED ORDER — SODIUM CHLORIDE 0.9 % IV SOLN
16.0000 mg/kg | Freq: Once | INTRAVENOUS | Status: AC
  Administered 2023-08-28: 1300 mg via INTRAVENOUS
  Filled 2023-08-28: qty 5

## 2023-08-28 NOTE — Progress Notes (Signed)
Patient seen by Dr. Kale  Vitals are within treatment parameters.  Labs reviewed: and are within treatment parameters.  Per physician team, patient is ready for treatment and there are NO modifications to the treatment plan.  

## 2023-08-28 NOTE — Progress Notes (Signed)
HEMATOLOGY/ONCOLOGY CLINIC NOTE  Date of Service: 08/28/23  Chief complaint - Follow-up for continued evaluation and management of high risk multiple myeloma  Current treatment-daratumumab/Revlimid/dexamethasone Has not f/u on and does not appear keen to consider Melphalan/HDT-AUTOHSCT  INTERVAL HISTORY:  Donald Suarez is a 57 y.o. male is here for continued evaluation and management of his multiple myeloma. Patient was last seen by me on 06/05/2023 and reported elevated blood sugar attributed to diet, but was otherwise doing well overall with no new medical concerns.   He was most recently seen by Thayil, PA on 07/31/2023 and reported stable neuropathy with no other complaints.   Today, he is scheduled to receive day 1 cycle 12 of treatment and is accompanied by an interpreter. He complains of a headache and weight loss. Patient has lost 7 pounds in the last month. Patient notes that he works outdoor regularly.  He complains of throat pain which is also present with swallowing. He has endorsed chills and night sweats during the night over the past week. He denies any rhinorrhea or recent fever.   Patient takes Metformin and Farxiga regularly with no missed doses. He reports that he has checked blood sugar levels at home previously, but has not recently. He notes that his glucose levels generally ranged 150-160 after eating.   He continues to take Revlimid regularly and denies any new or severe toxicities. Patient has no toxicity issues with his infusions.   MEDICAL HISTORY:  Past Medical History:  Diagnosis Date   Abfraction 08/06/2022   Accretions on teeth 08/06/2022   Anemia    Attrition, teeth excessive 08/06/2022   Caries 08/06/2022   Defective dental restoration 08/06/2022   Elevated ferritin 08/07/2022   Hypocalcemia 08/19/2022   In setting of multiple myeloma    Loose, teeth 08/06/2022   Malocclusion 08/06/2022   Microalbuminuria 08/19/2022   Periodontal disease 08/06/2022    Peripheral neuropathy 09/08/2022   Started 08/2022 A/w diabetes and revlimid usage Burning on top of left foot   Teeth missing 08/06/2022    SURGICAL HISTORY: Past Surgical History:  Procedure Laterality Date   APPENDECTOMY     [place] BONE MARROW BIOPSY & ASPIRATION  02/11/2023   [place] FLUORO GUIDE CV LINE RIGHT  05/15/2022   [place] PATIENT EVAL TECH 0-60 MINS  05/19/2022   [place] US GUIDE VASC ACCESS RIGHT  05/15/2022    SOCIAL HISTORY: Social History   Socioeconomic History   Marital status: Married    Spouse name: Not on file   Number of children: Not on file   Years of education: Not on file   Highest education level: Not on file  Occupational History   Not on file  Tobacco Use   Smoking status: Never   Smokeless tobacco: Never  Vaping Use   Vaping status: Never Used  Substance and Sexual Activity   Alcohol use: Never   Drug use: Never   Sexual activity: Not on file  Other Topics Concern   Not on file  Social History Narrative   Not on file   Social Determinants of Health   Financial Resource Strain: Medium Risk (05/21/2022)   Overall Financial Resource Strain (CARDIA)    Difficulty of Paying Living Expenses: Somewhat hard  Food Insecurity: Food Insecurity Present (05/21/2022)   Hunger Vital Sign    Worried About Running Out of Food in the Last Year: Sometimes true    Ran Out of Food in the Last Year: Sometimes true  Transportation Needs: No Transportation  Needs (05/21/2022)   PRAPARE - Administrator, Civil Service (Medical): No    Lack of Transportation (Non-Medical): No  Physical Activity: Not on file  Stress: Not on file  Social Connections: Not on file  Intimate Partner Violence: Not on file    FAMILY HISTORY: No family history on file.  ALLERGIES:  has No Known Allergies.  MEDICATIONS:  . Allergies as of 08/28/2023   No Known Allergies      Medication List        Accurate as of August 28, 2023  8:23 AM. If you have any questions, ask your nurse  or doctor.          Accu-Chek Guide test strip Generic drug: glucose blood Use to test blood sugars up to 4 times daily as directed.   Accu-Chek Guide w/Device Kit Use to test blood sugars up to 4 times daily as directed.   Accu-Chek Softclix Lancets lancets Use to test blood sugars up to 4 times daily as directed.   acyclovir 400 MG tablet Commonly known as: ZOVIRAX Take 1 tablet (400 mg total) by mouth 2 (two) times daily.   aspirin EC 81 MG tablet Take 1 tablet (81 mg total) by mouth daily. Swallow whole.   B-12 1000 MCG Caps Take 1 tablet by mouth daily.   dapagliflozin propanediol 10 MG Tabs tablet Commonly known as: Farxiga Take 1 tablet (10 mg total) by mouth daily before breakfast.   FreeStyle Libre 3 Sensor Misc 1 Application by Does not apply route every 14 (fourteen) days. Place 1 sensor on the skin every 14 days. Use to check glucose continuously   gabapentin 300 MG capsule Commonly known as: NEURONTIN Take 1 capsule (300 mg total) by mouth 3 (three) times daily.   Klor-Con M20 20 MEQ tablet Generic drug: potassium chloride SA TAKE 1 TABLET BY MOUTH EVERY DAY   lenalidomide 25 MG capsule Commonly known as: REVLIMID TAKE 1 CAPSULE BY MOUTH 1 TIME A DAY FOR 21 DAYS ON THEN 7 DAYS OFF   metFORMIN 500 MG tablet Commonly known as: GLUCOPHAGE Take 2 tablets (1,000 mg total) by mouth 2 (two) times daily with a meal. Note dose increased. Replaces 500 mg twice daily.   ondansetron 8 MG tablet Commonly known as: Zofran Take 1 tablet (8 mg total) by mouth every 8 (eight) hours as needed for nausea or vomiting.   prochlorperazine 10 MG tablet Commonly known as: COMPAZINE Take 1 tablet (10 mg total) by mouth every 6 (six) hours as needed for nausea or vomiting.         REVIEW OF SYSTEMS:    10 Point review of Systems was done is negative except as noted above.   PHYSICAL EXAMINATION: .There were no vitals taken for this visit.   GENERAL:alert, in no  acute distress and comfortable SKIN: no acute rashes, no significant lesions EYES: conjunctiva are pink and non-injected, sclera anicteric OROPHARYNX: MMM, no exudates, no oropharyngeal erythema or ulceration NECK: supple, no JVD LYMPH:  no palpable lymphadenopathy in the cervical, axillary or inguinal regions LUNGS: clear to auscultation b/l with normal respiratory effort HEART: regular rate & rhythm ABDOMEN:  normoactive bowel sounds , non tender, not distended. Extremity: no pedal edema PSYCH: alert & oriented x 3 with fluent speech NEURO: no focal motor/sensory deficits     LABORATORY DATA:  I have reviewed the data as listed  .    Latest Ref Rng & Units 07/31/2023   10:21 AM  07/03/2023   12:49 PM 06/09/2023    9:31 AM  CBC  WBC 4.0 - 10.5 K/uL 9.4  8.2  8.0   Hemoglobin 13.0 - 17.0 g/dL 16.1  09.6  04.5   Hematocrit 39.0 - 52.0 % 40.9  42.6  46.7   Platelets 150 - 400 K/uL 239  181  205.0     .    Latest Ref Rng & Units 07/31/2023   12:28 PM 07/03/2023   12:49 PM 06/09/2023    9:31 AM  CMP  Glucose 70 - 99 mg/dL 409  811  914   BUN 6 - 20 mg/dL 14  13  15    Creatinine 0.61 - 1.24 mg/dL 7.82  9.56  2.13   Sodium 135 - 145 mmol/L 137  139  139   Potassium 3.5 - 5.1 mmol/L 3.5  3.4  3.8   Chloride 98 - 111 mmol/L 104  103  101   CO2 22 - 32 mmol/L 25  27  29    Calcium 8.9 - 10.3 mg/dL 8.4  9.3  9.7   Total Protein 6.5 - 8.1 g/dL 6.6  6.0  6.6   Total Bilirubin 0.3 - 1.2 mg/dL 0.6  0.5  0.7   Alkaline Phos 38 - 126 U/L 72  50  56   AST 15 - 41 U/L 12  11  13    ALT 0 - 44 U/L 24  15  19     . Lab Results  Component Value Date   LDH 114 10/10/2022    Bone marrow biopsy 02/11/2023   RADIOGRAPHIC STUDIES: I have personally reviewed the radiological images as listed and agreed with the findings in the report. No results found.  ASSESSMENT & PLAN:   1) R-ISS stage III high risk IgG Lambda Multiple myeloma not on treatment for more than 1 year due to patient's lack of  follow-up.  Patient diagnosed December 2021 and received 1 cycle of CyBorD. Had previously presented with anemia renal insufficiency and hyperviscosity symptoms. Bx- 90% plasma cells Initial M spike 8.03g/dl YQM(5H): Not Detected  Dup(1q): Not Detected  Gains(15): DETECTED  Gains(5 and 9): Not Detected  Del(13q)/-13: DETECTED  Del(17p)(TP53): Not Detected  IGH(Rearrangement): SEE BELOW   IgH complex: t(4;14): DETECTED  t(11;14): Not Detected  t(14;16): Not Detected  t(14;20): Not Detected   Cytogenetics: Normal male karyotype.   -Labs from 05/13/2022-beta-2 microglobulin 4.4, LDH 197, lambda free light chain 77.1, kappa, lambda light chain ratio 0.08, multiple myeloma panel pending.  UPEP ordered but not yet collected. 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  Due to multiple myeloma   PLAN  -Discussed lab results on 08/28/23 in detail with patient. CBC normal, showed WBC of 9.9K, hemoglobin of 13.1, and platelets of 252K. -no anemia -myeloma lab from last month showed a small amount of abnormal protein with M protein level of 0.2. Will continue to monitor IgG lambda levels closely.  -CMP shows elevated blood sugar levels in the 300s, CMP otherwise stable -no indication to adjust treatment at this time, will continue  current treatment -will continue dexamethasone at *** MG -Continue maintenance monthly 15 mg Revlimid  po 3 weeks on 1 week off and Daratumumab for high risk multiple  myeloma -continue monthly Zometa, may switch to maintenance Zometa after 1 year -recommend salt water gargles to improve throat pain -will start a short course of antibiotics  -advised patient to drink at least 2L of water daily to optimize hydration -informed patient that appetite suppression is likely linked to metformin  -advised patient to stay UTD with age-appropriate vaccinations to reduce risk of infections -informed patient that diabetes may cause symptoms including headaches, dehydration, fatigue, or weakness -informed patient that frequent sun exposure may cause headaches -advised patient to connect with PCP to evaluate and manage DM and blood sugar levels. There may be a role to adjust DM medications -advised patient to connect with [name] or PCP if he develops any fever or cough symptoms -will continue to monitor closely with labs  Follow-up ***  The total time spent in the appointment was *** minutes* .  All of the patient's questions were answered with apparent satisfaction. The patient knows to call the clinic with any problems, questions or concerns.   Wyvonnia Lora MD MS AAHIVMS [facility] Hematology/Oncology [facility]  .*Total Encounter Time as defined by the Centers for Medicare and Medicaid Services includes, in addition to the face-to-face time of a patient visit (documented in the note above) non-face-to-face time: obtaining and reviewing outside history, ordering and reviewing medications, tests or procedures, care coordination (communications with other health care professionals or caregivers) and documentation in the medical record.    I,Mitra Faeizi,acting as a Neurosurgeon for Wyvonnia Lora, MD.,have documented all relevant documentation on the behalf of Wyvonnia Lora, MD,as directed by  Wyvonnia Lora, MD while in the presence of Wyvonnia Lora, MD.  ***

## 2023-08-28 NOTE — Patient Instructions (Signed)
[facility]  Discharge Instructions: Thank you for choosing [facility] to provide your oncology and hematology care.   If you have a lab appointment with the Cancer Center, please go directly to the Cancer Center and check in at the registration area.   Wear comfortable clothing and clothing appropriate for easy access to any Portacath or PICC line.   We strive to give you quality time with your provider. You may need to reschedule your appointment if you arrive late (15 or more minutes).  Arriving late affects you and other patients whose appointments are after yours.  Also, if you miss three or more appointments without notifying the office, you may be dismissed from the clinic at the provider's discretion.      For prescription refill requests, have your pharmacy contact our office and allow 72 hours for refills to be completed.    Today you received the following chemotherapy and/or immunotherapy agents: Daratumumab.       To help prevent nausea and vomiting after your treatment, we encourage you to take your nausea medication as directed.  BELOW ARE SYMPTOMS THAT SHOULD BE REPORTED IMMEDIATELY: *FEVER GREATER THAN 100.4 F (38 C) OR HIGHER *CHILLS OR SWEATING *NAUSEA AND VOMITING THAT IS NOT CONTROLLED WITH YOUR NAUSEA MEDICATION *UNUSUAL SHORTNESS OF BREATH *UNUSUAL BRUISING OR BLEEDING *URINARY PROBLEMS (pain or burning when urinating, or frequent urination) *BOWEL PROBLEMS (unusual diarrhea, constipation, pain near the anus) TENDERNESS IN MOUTH AND THROAT WITH OR WITHOUT PRESENCE OF ULCERS (sore throat, sores in mouth, or a toothache) UNUSUAL RASH, SWELLING OR PAIN  UNUSUAL VAGINAL DISCHARGE OR ITCHING   Items with * indicate a potential emergency and should be followed up as soon as possible or go to the Emergency Department if any problems should occur.  Please show the CHEMOTHERAPY ALERT CARD or IMMUNOTHERAPY ALERT CARD at  check-in to the Emergency Department and triage nurse.  Should you have questions after your visit or need to cancel or reschedule your appointment, please contact [facility]  Dept: [phone]  and follow the prompts.  Office hours are 8:00 a.m. to 4:30 p.m. Monday - Friday. Please note that voicemails left after 4:00 p.m. may not be returned until the following business day.  We are closed weekends and major holidays. You have access to a nurse at all times for urgent questions. Please call the main number to the clinic Dept: [phone] and follow the prompts.   For any non-urgent questions, you may also contact your provider using MyChart. We now offer e-Visits for anyone 18 and older to request care online for non-urgent symptoms. For details visit [email or web address].   Also download the MyChart app! Go to the app store, search "MyChart", open the app, select [facility], and log in with your MyChart username and password.   

## 2023-08-28 NOTE — Progress Notes (Signed)
Pt given Revlimid rx on 08/28/23. Pt to start taking on 08/29/23 and take for 21 days:08/29/23 - 9/ 20/24. Pt will take 7 days off from 09/19/23 - 09/25/23. Pt will restart next cycle on 09/26/23.

## 2023-09-01 LAB — MULTIPLE MYELOMA PANEL, SERUM
Albumin SerPl Elph-Mcnc: 3 g/dL (ref 2.9–4.4)
Albumin/Glob SerPl: 0.9 (ref 0.7–1.7)
Alpha 1: 0.2 g/dL (ref 0.0–0.4)
Alpha2 Glob SerPl Elph-Mcnc: 1.6 g/dL — ABNORMAL HIGH (ref 0.4–1.0)
B-Globulin SerPl Elph-Mcnc: 1.1 g/dL (ref 0.7–1.3)
Gamma Glob SerPl Elph-Mcnc: 0.5 g/dL (ref 0.4–1.8)
Globulin, Total: 3.4 g/dL (ref 2.2–3.9)
IgA: 204 mg/dL (ref 90–386)
IgG (Immunoglobin G), Serum: 675 mg/dL (ref 603–1613)
IgM (Immunoglobulin M), Srm: 31 mg/dL (ref 20–172)
M Protein SerPl Elph-Mcnc: 0.2 g/dL — ABNORMAL HIGH
Total Protein ELP: 6.4 g/dL (ref 6.0–8.5)

## 2023-09-03 ENCOUNTER — Encounter: Payer: Self-pay | Admitting: Hematology

## 2023-09-03 MED ORDER — AZITHROMYCIN 250 MG PO TABS: ORAL_TABLET | ORAL | 0 refills | Status: DC

## 2023-09-05 ENCOUNTER — Other Ambulatory Visit: Payer: Self-pay

## 2023-09-06 ENCOUNTER — Other Ambulatory Visit: Payer: Self-pay

## 2023-09-09 ENCOUNTER — Other Ambulatory Visit: Payer: BC Managed Care – PPO

## 2023-09-09 ENCOUNTER — Ambulatory Visit: Payer: BC Managed Care – PPO | Admitting: Internal Medicine

## 2023-09-09 ENCOUNTER — Encounter: Payer: Self-pay | Admitting: Internal Medicine

## 2023-09-09 VITALS — BP 118/80 | HR 80 | Temp 97.7°F | Ht 66.0 in | Wt 168.6 lb

## 2023-09-09 DIAGNOSIS — E1165 Type 2 diabetes mellitus with hyperglycemia: Secondary | ICD-10-CM | POA: Diagnosis not present

## 2023-09-09 DIAGNOSIS — Z758 Other problems related to medical facilities and other health care: Secondary | ICD-10-CM

## 2023-09-09 DIAGNOSIS — R7309 Other abnormal glucose: Secondary | ICD-10-CM

## 2023-09-09 DIAGNOSIS — Z603 Acculturation difficulty: Secondary | ICD-10-CM | POA: Diagnosis not present

## 2023-09-09 DIAGNOSIS — H538 Other visual disturbances: Secondary | ICD-10-CM

## 2023-09-09 DIAGNOSIS — C9001 Multiple myeloma in remission: Secondary | ICD-10-CM | POA: Diagnosis not present

## 2023-09-09 DIAGNOSIS — Z794 Long term (current) use of insulin: Secondary | ICD-10-CM | POA: Diagnosis not present

## 2023-09-09 LAB — CBC WITH DIFFERENTIAL/PLATELET
Basophils Absolute: 0.1 10*3/uL (ref 0.0–0.1)
Basophils Relative: 0.8 % (ref 0.0–3.0)
Eosinophils Absolute: 0.1 10*3/uL (ref 0.0–0.7)
Eosinophils Relative: 1.5 % (ref 0.0–5.0)
HCT: 41.2 % (ref 39.0–52.0)
Hemoglobin: 13 g/dL (ref 13.0–17.0)
Lymphocytes Relative: 38.1 % (ref 12.0–46.0)
Lymphs Abs: 3.4 10*3/uL (ref 0.7–4.0)
MCHC: 31.5 g/dL (ref 30.0–36.0)
MCV: 78 fl (ref 78.0–100.0)
Monocytes Absolute: 0.6 10*3/uL (ref 0.1–1.0)
Monocytes Relative: 7.1 % (ref 3.0–12.0)
Neutro Abs: 4.7 10*3/uL (ref 1.4–7.7)
Neutrophils Relative %: 52.5 % (ref 43.0–77.0)
Platelets: 280 10*3/uL (ref 150.0–400.0)
RBC: 5.28 Mil/uL (ref 4.22–5.81)
RDW: 16.8 % — ABNORMAL HIGH (ref 11.5–15.5)
WBC: 8.9 10*3/uL (ref 4.0–10.5)

## 2023-09-09 LAB — MICROALBUMIN / CREATININE URINE RATIO
Creatinine,U: 118.8 mg/dL
Microalb Creat Ratio: 1 mg/g (ref 0.0–30.0)
Microalb, Ur: 1.2 mg/dL (ref 0.0–1.9)

## 2023-09-09 LAB — LIPID PANEL
Cholesterol: 163 mg/dL (ref 0–200)
HDL: 33.7 mg/dL — ABNORMAL LOW (ref >39.00)
LDL Cholesterol: 112 mg/dL — ABNORMAL HIGH (ref 0–99)
NonHDL: 129.77
Total CHOL/HDL Ratio: 5
Triglycerides: 88 mg/dL (ref 0.0–149.0)
VLDL: 17.6 mg/dL (ref 0.0–40.0)

## 2023-09-09 LAB — COMPREHENSIVE METABOLIC PANEL
ALT: 34 U/L (ref 0–53)
AST: 18 U/L (ref 0–37)
Albumin: 3.9 g/dL (ref 3.5–5.2)
Alkaline Phosphatase: 85 U/L (ref 39–117)
BUN: 9 mg/dL (ref 6–23)
CO2: 29 meq/L (ref 19–32)
Calcium: 9.8 mg/dL (ref 8.4–10.5)
Chloride: 98 meq/L (ref 96–112)
Creatinine, Ser: 0.71 mg/dL (ref 0.40–1.50)
GFR: 102.43 mL/min (ref >60.00)
Glucose, Bld: 155 mg/dL — ABNORMAL HIGH (ref 70–99)
Potassium: 4.1 meq/L (ref 3.5–5.1)
Sodium: 136 meq/L (ref 135–145)
Total Bilirubin: 0.5 mg/dL (ref 0.2–1.2)
Total Protein: 7.4 g/dL (ref 6.0–8.3)

## 2023-09-09 NOTE — Assessment & Plan Note (Signed)
His blurry vision has improved but persists, with home glucose readings varying between 150-200. He is currently on Metformin 1000mg  BID and Farxiga, with a previously elevated A1C at 8.1. We will order labs today, including A1C. If A1C remains above 7, we will consider adding an additional diabetes medication. He should continue Metformin 1000mg  BID and Farxiga as prescribed and is encouraged to reduce starch in his diet, specifically white rice, with alternatives such as cauliflower rice suggested.

## 2023-09-09 NOTE — Assessment & Plan Note (Signed)
He is on dexamethasone for multiple myeloma, which may contribute to elevated glucose levels and potential cataract formation. He should continue the current treatment as directed by his oncology specialist, who is following closely.   He reports adherence to revlimid and dexamethasone

## 2023-09-09 NOTE — Progress Notes (Signed)
[facility]: [phone]   -- Medical Office Visit --  Patient:  Donald Suarez      Age: 57 y.o.       Sex:  male  Date:   09/09/2023 Patient Care Team: Lula Olszewski, MD as PCP - General (Internal Medicine) Johney Maine, MD as Consulting Physician (Hematology) Today's Healthcare Provider: Lula Olszewski, MD      Assessment & Plan Type 2 diabetes mellitus with hyperglycemia, with long-term current use of insulin (HCC) His blurry vision has improved but persists, with home glucose readings varying between 150-200. He is currently on Metformin 1000mg  BID and Farxiga, with a previously elevated A1C at 8.1. We will order labs today, including A1C. If A1C remains above 7, we will consider adding an additional diabetes medication. He should continue Metformin 1000mg  BID and Farxiga as prescribed and is encouraged to reduce starch in his diet, specifically white rice, with alternatives such as cauliflower rice suggested. Blurry vision Despite improved glucose control, his blurry vision persists without a recent ophthalmology evaluation. We will refer him to Dr. Alden Hipp for an ophthalmology evaluation. Multiple myeloma in remission (HCC) He is on dexamethasone for multiple myeloma, which may contribute to elevated glucose levels and potential cataract formation. He should continue the current treatment as directed by his oncology specialist, who is following closely.   He reports adherence to revlimid and dexamethasone Language barrier affecting health care Live translator services used, and information was frequently repeated to confirm understanding.       Diagnoses and all orders for this visit: Type 2 diabetes mellitus with hyperglycemia, with long-term current use of insulin (HCC) -     Ambulatory referral to Ophthalmology -     CBC with Differential/Platelet -     Comprehensive metabolic panel -     Lipid panel -     Hemoglobin A1c -     Microalbumin /  creatinine urine ratio -     Microalbumin / creatinine urine ratio Blurry vision  Recommended follow-up: Follow-up in 3 months or sooner if lab results are concerning.  Future Appointments  Date Time Provider Department Center  09/25/2023 11:15 AM [facility] LAB [facility] None  09/25/2023 12:00 PM Johney Maine, MD [facility] None  09/25/2023  1:00 PM [facility] INFUSION [facility] None  10/23/2023 12:00 PM [facility] None  10/23/2023  1:00 PM [facility] INFUSION [facility] None    Medical Decision Making: 2 or more stable chronic illnesses     Ordering of each unique test; Diagnosis or treatment significantly limited by social determinants of health          Subjective   57 y.o. male who has Multiple myeloma in remission (HCC); Counseling regarding advance care planning and goals of care; Hypoalbuminemia; Hyperviscosity; Type 2 diabetes mellitus with hyperglycemia (HCC); Chronic apical periodontitis; Hyperproteinemia; Blurry vision; Hypomagnesemia; Elevated ferritin; Microalbuminuria; Hypocalcemia; Peripheral neuropathy; Language barrier affecting health care; Hypokalemia; and History of dental problems on their problem list. His reasons/main concerns/chief complaints for today's office visit are 3 month follow-up   ------------------------------------------------------------------------------------------------------------------------ AI-Extracted: Discussed the use of AI scribe software for clinical note transcription with the patient, who gave verbal consent to proceed.  History of Present Illness   The patient, with a history of diabetes and myeloma, presents for a three-month follow-up. He reports improved blurry vision since the last visit, although he still experiences difficulty reading. He has not yet seen an eye doctor as previously recommended. The patient's blood glucose levels  have been ranging from 150 to 200. He is currently on Metformin,  taking two 500mg  tablets in the morning and evening, and Farxiga. He also reports being on dexamethasone for myeloma management.  The patient's diet is high in starch, primarily from white rice, which he finds challenging to give up. He has not tried alternatives like cauliflower rice. He was previously using a glucose sensor on the arm for monitoring, but has discontinued due to issues with it staying on during outdoor work and sweating. He is now using finger sticks for glucose monitoring.  The patient is also on a new medication, gabapentin, and a multitude of other medications for his conditions. He has seen his myeloma specialist twice since the last visit with the current provider.      He has a past medical history of Abfraction (08/06/2022), Accretions on teeth (08/06/2022), Anemia, Attrition, teeth excessive (08/06/2022), Caries (08/06/2022), Defective dental restoration (08/06/2022), Elevated ferritin (08/07/2022), Hypocalcemia (08/19/2022), Loose, teeth (08/06/2022), Malocclusion (08/06/2022), Microalbuminuria (08/19/2022), Periodontal disease (08/06/2022), Peripheral neuropathy (09/08/2022), and Teeth missing (08/06/2022).  Problem list overviews that were updated at today's visit: Problem  Type 2 Diabetes Mellitus With Hyperglycemia (Hcc)   Lab Results  Component Value Date   HGBA1C 8.1 (H) 06/09/2023   HGBA1C 8.2 (A) 03/09/2023   HGBA1C 6.7 (H) 08/12/2022   Lab Results  Component Value Date/Time   GLUCOSE 317 (H) 08/28/2023 09:25 AM   GLUCOSE 193 (H) 07/31/2023 12:28 PM   GLUCOSE 166 (H) 07/03/2023 12:49 PM   GLUCOSE 152 (H) 06/09/2023 09:31 AM   GLUCOSE 254 (H) 06/05/2023 09:08 AM   GLUCOSE 201 (H) 05/08/2023 09:15 AM   GLUCOSE 252 (H) 04/24/2023 09:45 AM   GLUCOSE 275 (H) 04/10/2023 12:14 PM   GLUCOSE 135 (H) 03/27/2023 10:31 AM   GLUCOSE 301 (H) 03/13/2023 12:09 PM    Metformin only Gabapentin for neuropathy     Current Outpatient Medications on File Prior to  Visit  Medication Sig   Accu-Chek Softclix Lancets lancets Use to test blood sugars up to 4 times daily as directed.   acyclovir (ZOVIRAX) 400 MG tablet Take 1 tablet (400 mg total) by mouth 2 (two) times daily.   aspirin EC 81 MG tablet Take 1 tablet (81 mg total) by mouth daily. Swallow whole.   azithromycin (ZITHROMAX Z-PAK) 250 MG tablet 500mg  po x 1 day then 250mg  po daily for 4 days   Blood Glucose Monitoring Suppl (ACCU-CHEK GUIDE) w/Device KIT Use to test blood sugars up to 4 times daily as directed.   Continuous Blood Gluc Sensor (FREESTYLE LIBRE 3 SENSOR) MISC 1 Application by Does not apply route every 14 (fourteen) days. Place 1 sensor on the skin every 14 days. Use to check glucose continuously   Cyanocobalamin (B-12) 1000 MCG CAPS Take 1 tablet by mouth daily.   dapagliflozin propanediol (FARXIGA) 10 MG TABS tablet Take 1 tablet (10 mg total) by mouth daily before breakfast.   gabapentin (NEURONTIN) 300 MG capsule Take 1 capsule (300 mg total) by mouth 3 (three) times daily.   glucose blood (ACCU-CHEK GUIDE) test strip Use to test blood sugars up to 4 times daily as directed.   KLOR-CON M20 20 MEQ tablet TAKE 1 TABLET BY MOUTH EVERY DAY   lenalidomide (REVLIMID) 25 MG capsule TAKE 1 CAPSULE BY MOUTH 1 TIME A DAY FOR 21 DAYS ON THEN 7 DAYS OFF   metFORMIN (GLUCOPHAGE) 500 MG tablet Take 2 tablets (1,000 mg total) by mouth 2 (two) times daily  with a meal. Note dose increased. Replaces 500 mg twice daily.   ondansetron (ZOFRAN) 8 MG tablet Take 1 tablet (8 mg total) by mouth every 8 (eight) hours as needed for nausea or vomiting.   prochlorperazine (COMPAZINE) 10 MG tablet Take 1 tablet (10 mg total) by mouth every 6 (six) hours as needed for nausea or vomiting.   Current Facility-Administered Medications on File Prior to Visit  Medication   clotrimazole (LOTRIMIN) 1 % cream  There are no discontinued medications.   Objective   Physical Exam  BP 118/80 (BP Location: Left Arm,  Patient Position: Sitting)   Pulse 80   Temp 97.7 F (36.5 C) (Temporal)   Ht 5\' 6"  (1.676 m)   Wt 168 lb 9.6 oz (76.5 kg)   SpO2 99%   BMI 27.21 kg/m  Wt Readings from Last 10 Encounters:  09/09/23 168 lb 9.6 oz (76.5 kg)  08/28/23 170 lb 3.2 oz (77.2 kg)  07/31/23 177 lb 6.4 oz (80.5 kg)  07/03/23 178 lb 4 oz (80.9 kg)  06/09/23 172 lb (78 kg)  06/05/23 176 lb 4.8 oz (80 kg)  05/08/23 176 lb 4 oz (79.9 kg)  04/24/23 176 lb 6.4 oz (80 kg)  04/10/23 178 lb (80.7 kg)  03/27/23 175 lb 6.4 oz (79.6 kg)   Vital signs reviewed.  Nursing notes reviewed. Weight trend reviewed. Abnormalities and Problem-Specific physical exam findings:  very limited english, mild truncal adiposity.  General Appearance:  No acute distress appreciable.   Well-groomed, healthy-appearing male.  Well proportioned with no abnormal fat distribution.  Good muscle tone. Pulmonary:  Normal work of breathing at rest, no respiratory distress apparent. SpO2: 99 %  Musculoskeletal: All extremities are intact.  Neurological:  Awake, alert, oriented, and engaged.  No obvious focal neurological deficits or cognitive impairments.  Sensorium seems unclouded.   Speech is clear and coherent with logical content. Psychiatric:  Appropriate mood, pleasant and cooperative demeanor, thoughtful and engaged during the exam  Results   LABS HbA1c: 8.1%        No results found for any visits on 09/09/23.  Appointment on 08/28/2023  Component Date Value   WBC Count 08/28/2023 9.9    RBC 08/28/2023 5.17    Hemoglobin 08/28/2023 13.1    HCT 08/28/2023 39.4    MCV 08/28/2023 76.2 (L)    MCH 08/28/2023 25.3 (L)    MCHC 08/28/2023 33.2    RDW 08/28/2023 15.1    Platelet Count 08/28/2023 252    nRBC 08/28/2023 0.0    Neutrophils Relative % 08/28/2023 58    Neutro Abs 08/28/2023 5.9    Lymphocytes Relative 08/28/2023 25    Lymphs Abs 08/28/2023 2.5    Monocytes Relative 08/28/2023 13    Monocytes Absolute 08/28/2023 1.2 (H)     Eosinophils Relative 08/28/2023 1    Eosinophils Absolute 08/28/2023 0.1    Basophils Relative 08/28/2023 2    Basophils Absolute 08/28/2023 0.2 (H)    Immature Granulocytes 08/28/2023 1    Abs Immature Granulocytes 08/28/2023 0.05    Sodium 08/28/2023 135    Potassium 08/28/2023 3.5    Chloride 08/28/2023 100    CO2 08/28/2023 25    Glucose, Bld 08/28/2023 317 (H)    BUN 08/28/2023 10    Creatinine, Ser 08/28/2023 0.71    Calcium 08/28/2023 8.8 (L)    Total Protein 08/28/2023 7.2    Albumin 08/28/2023 3.7    AST 08/28/2023 14 (L)    ALT 08/28/2023 40  Alkaline Phosphatase 08/28/2023 111    Total Bilirubin 08/28/2023 0.6    GFR, Estimated 08/28/2023 >60    Anion gap 08/28/2023 10    IgG (Immunoglobin G), Se* 08/28/2023 675    IgA 08/28/2023 204    IgM (Immunoglobulin M), * 08/28/2023 31    Total Protein ELP 08/28/2023 6.4    Albumin SerPl Elph-Mcnc 08/28/2023 3.0    Alpha 1 08/28/2023 0.2    Alpha2 Glob SerPl Elph-M* 08/28/2023 1.6 (H)    B-Globulin SerPl Elph-Mc* 08/28/2023 1.1    Gamma Glob SerPl Elph-Mc* 08/28/2023 0.5    M Protein SerPl Elph-Mcnc 08/28/2023 0.2 (H)    Globulin, Total 08/28/2023 3.4    Albumin/Glob SerPl 08/28/2023 0.9    IFE 1 08/28/2023 Comment (A)    Please Note 08/28/2023 Comment   Appointment on 07/31/2023  Component Date Value   IgG (Immunoglobin G), Se* 07/31/2023 699    IgA 07/31/2023 153    IgM (Immunoglobulin M), * 07/31/2023 26    Total Protein ELP 07/31/2023 6.3    Albumin SerPl Elph-Mcnc 07/31/2023 3.2    Alpha 1 07/31/2023 0.2    Alpha2 Glob SerPl Elph-M* 07/31/2023 1.2 (H)    B-Globulin SerPl Elph-Mc* 07/31/2023 1.0    Gamma Glob SerPl Elph-Mc* 07/31/2023 0.6    M Protein SerPl Elph-Mcnc 07/31/2023 0.2 (H)    Globulin, Total 07/31/2023 3.1    Albumin/Glob SerPl 07/31/2023 1.1    IFE 1 07/31/2023 Comment (A)    Please Note 07/31/2023 Comment    Sodium 07/31/2023 137    Potassium 07/31/2023 3.5    Chloride 07/31/2023 104     CO2 07/31/2023 25    Glucose, Bld 07/31/2023 193 (H)    BUN 07/31/2023 14    Creatinine, Ser 07/31/2023 0.65    Calcium 07/31/2023 8.4 (L)    Total Protein 07/31/2023 6.6    Albumin 07/31/2023 3.8    AST 07/31/2023 12 (L)    ALT 07/31/2023 24    Alkaline Phosphatase 07/31/2023 72    Total Bilirubin 07/31/2023 0.6    GFR, Estimated 07/31/2023 >60    Anion gap 07/31/2023 8   Appointment on 07/31/2023  Component Date Value   WBC Count 07/31/2023 9.4    RBC 07/31/2023 5.33    Hemoglobin 07/31/2023 13.5    HCT 07/31/2023 40.9    MCV 07/31/2023 76.7 (L)    MCH 07/31/2023 25.3 (L)    MCHC 07/31/2023 33.0    RDW 07/31/2023 15.1    Platelet Count 07/31/2023 239    nRBC 07/31/2023 0.0    Neutrophils Relative % 07/31/2023 55    Neutro Abs 07/31/2023 5.1    Lymphocytes Relative 07/31/2023 29    Lymphs Abs 07/31/2023 2.7    Monocytes Relative 07/31/2023 13    Monocytes Absolute 07/31/2023 1.2 (H)    Eosinophils Relative 07/31/2023 2    Eosinophils Absolute 07/31/2023 0.2    Basophils Relative 07/31/2023 1    Basophils Absolute 07/31/2023 0.1    Immature Granulocytes 07/31/2023 0    Abs Immature Granulocytes 07/31/2023 0.03   Appointment on 07/03/2023  Component Date Value   WBC Count 07/03/2023 8.2    RBC 07/03/2023 5.41    Hemoglobin 07/03/2023 14.0    HCT 07/03/2023 42.6    MCV 07/03/2023 78.7 (L)    MCH 07/03/2023 25.9 (L)    MCHC 07/03/2023 32.9    RDW 07/03/2023 15.9 (H)    Platelet Count 07/03/2023 181    nRBC 07/03/2023 0.0  Neutrophils Relative % 07/03/2023 56    Neutro Abs 07/03/2023 4.6    Lymphocytes Relative 07/03/2023 31    Lymphs Abs 07/03/2023 2.5    Monocytes Relative 07/03/2023 10    Monocytes Absolute 07/03/2023 0.8    Eosinophils Relative 07/03/2023 2    Eosinophils Absolute 07/03/2023 0.2    Basophils Relative 07/03/2023 1    Basophils Absolute 07/03/2023 0.1    Immature Granulocytes 07/03/2023 0    Abs Immature Granulocytes 07/03/2023 0.02     Sodium 07/03/2023 139    Potassium 07/03/2023 3.4 (L)    Chloride 07/03/2023 103    CO2 07/03/2023 27    Glucose, Bld 07/03/2023 166 (H)    BUN 07/03/2023 13    Creatinine, Ser 07/03/2023 0.86    Calcium 07/03/2023 9.3    Total Protein 07/03/2023 6.0 (L)    Albumin 07/03/2023 3.9    AST 07/03/2023 11 (L)    ALT 07/03/2023 15    Alkaline Phosphatase 07/03/2023 50    Total Bilirubin 07/03/2023 0.5    GFR, Estimated 07/03/2023 >60    Anion gap 07/03/2023 9    IgG (Immunoglobin G), Se* 07/03/2023 652    IgA 07/03/2023 113    IgM (Immunoglobulin M), * 07/03/2023 27    Total Protein ELP 07/03/2023 6.0    Albumin SerPl Elph-Mcnc 07/03/2023 3.6    Alpha 1 07/03/2023 0.1    Alpha2 Glob SerPl Elph-M* 07/03/2023 0.9    B-Globulin SerPl Elph-Mc* 07/03/2023 0.9    Gamma Glob SerPl Elph-Mc* 07/03/2023 0.5    M Protein SerPl Elph-Mcnc 07/03/2023 0.1 (H)    Globulin, Total 07/03/2023 2.4    Albumin/Glob SerPl 07/03/2023 1.6    IFE 1 07/03/2023 Comment (A)    Please Note 07/03/2023 Comment   Lab on 06/09/2023  Component Date Value   Hgb A1c MFr Bld 06/09/2023 8.1 (H)    Mean Plasma Glucose 06/09/2023 186    eAG (mmol/L) 06/09/2023 10.3   Office Visit on 06/09/2023  Component Date Value   WBC 06/09/2023 8.0    RBC 06/09/2023 5.80    Hemoglobin 06/09/2023 14.7    HCT 06/09/2023 46.7    MCV 06/09/2023 80.6    MCHC 06/09/2023 31.6    RDW 06/09/2023 16.4 (H)    Platelets 06/09/2023 205.0    Neutrophils Relative % 06/09/2023 51.1    Lymphocytes Relative 06/09/2023 31.8    Monocytes Relative 06/09/2023 13.2 (H)    Eosinophils Relative 06/09/2023 3.4    Basophils Relative 06/09/2023 0.5    Neutro Abs 06/09/2023 4.1    Lymphs Abs 06/09/2023 2.5    Monocytes Absolute 06/09/2023 1.1 (H)    Eosinophils Absolute 06/09/2023 0.3    Basophils Absolute 06/09/2023 0.0    Sodium 06/09/2023 139    Potassium 06/09/2023 3.8    Chloride 06/09/2023 101    CO2 06/09/2023 29    Glucose, Bld  06/09/2023 152 (H)    BUN 06/09/2023 15    Creatinine, Ser 06/09/2023 0.68    Total Bilirubin 06/09/2023 0.7    Alkaline Phosphatase 06/09/2023 56    AST 06/09/2023 13    ALT 06/09/2023 19    Total Protein 06/09/2023 6.6    Albumin 06/09/2023 4.3    GFR 06/09/2023 103.96    Calcium 06/09/2023 9.7    Cholesterol 06/09/2023 195    Triglycerides 06/09/2023 111.0    HDL 06/09/2023 44.20    VLDL 06/09/2023 22.2    LDL Cholesterol 06/09/2023 129 (H)    Total  CHOL/HDL Ratio 06/09/2023 4    NonHDL 06/09/2023 151.04    Microalb, Ur 06/09/2023 <0.7    Creatinine,U 06/09/2023 92.0    Microalb Creat Ratio 06/09/2023 0.8   Appointment on 06/05/2023  Component Date Value   WBC Count 06/05/2023 6.7    RBC 06/05/2023 5.58    Hemoglobin 06/05/2023 14.7    HCT 06/05/2023 43.2    MCV 06/05/2023 77.4 (L)    MCH 06/05/2023 26.3    MCHC 06/05/2023 34.0    RDW 06/05/2023 15.3    Platelet Count 06/05/2023 199    nRBC 06/05/2023 0.0    Neutrophils Relative % 06/05/2023 50    Neutro Abs 06/05/2023 3.4    Lymphocytes Relative 06/05/2023 35    Lymphs Abs 06/05/2023 2.4    Monocytes Relative 06/05/2023 9    Monocytes Absolute 06/05/2023 0.6    Eosinophils Relative 06/05/2023 4    Eosinophils Absolute 06/05/2023 0.3    Basophils Relative 06/05/2023 2    Basophils Absolute 06/05/2023 0.1    Immature Granulocytes 06/05/2023 0    Abs Immature Granulocytes 06/05/2023 0.02    Sodium 06/05/2023 137    Potassium 06/05/2023 3.4 (L)    Chloride 06/05/2023 102    CO2 06/05/2023 26    Glucose, Bld 06/05/2023 254 (H)    BUN 06/05/2023 15    Creatinine, Ser 06/05/2023 0.84    Calcium 06/05/2023 9.2    Total Protein 06/05/2023 6.8    Albumin 06/05/2023 4.1    AST 06/05/2023 14 (L)    ALT 06/05/2023 20    Alkaline Phosphatase 06/05/2023 57    Total Bilirubin 06/05/2023 0.5    GFR, Estimated 06/05/2023 >60    Anion gap 06/05/2023 9    IgG (Immunoglobin G), Se* 06/05/2023 693    IgA 06/05/2023 138     IgM (Immunoglobulin M), * 06/05/2023 27    Total Protein ELP 06/05/2023 6.1    Albumin SerPl Elph-Mcnc 06/05/2023 3.5    Alpha 1 06/05/2023 0.1    Alpha2 Glob SerPl Elph-M* 06/05/2023 1.0    B-Globulin SerPl Elph-Mc* 06/05/2023 0.9    Gamma Glob SerPl Elph-Mc* 06/05/2023 0.5    M Protein SerPl Elph-Mcnc 06/05/2023 Not Observed    Globulin, Total 06/05/2023 2.6    Albumin/Glob SerPl 06/05/2023 1.4    IFE 1 06/05/2023 Comment    Please Note 06/05/2023 Comment   Appointment on 05/08/2023  Component Date Value   WBC Count 05/08/2023 9.2    RBC 05/08/2023 5.76    Hemoglobin 05/08/2023 15.0    HCT 05/08/2023 45.5    MCV 05/08/2023 79.0 (L)    MCH 05/08/2023 26.0    MCHC 05/08/2023 33.0    RDW 05/08/2023 15.4    Platelet Count 05/08/2023 178    nRBC 05/08/2023 0.0    Neutrophils Relative % 05/08/2023 63    Neutro Abs 05/08/2023 5.7    Lymphocytes Relative 05/08/2023 22    Lymphs Abs 05/08/2023 2.1    Monocytes Relative 05/08/2023 9    Monocytes Absolute 05/08/2023 0.8    Eosinophils Relative 05/08/2023 5    Eosinophils Absolute 05/08/2023 0.4    Basophils Relative 05/08/2023 1    Basophils Absolute 05/08/2023 0.1    Immature Granulocytes 05/08/2023 0    Abs Immature Granulocytes 05/08/2023 0.03    Sodium 05/08/2023 137    Potassium 05/08/2023 4.1    Chloride 05/08/2023 103    CO2 05/08/2023 26    Glucose, Bld 05/08/2023 201 (H)    BUN 05/08/2023  11    Creatinine, Ser 05/08/2023 0.73    Calcium 05/08/2023 9.0    Total Protein 05/08/2023 7.0    Albumin 05/08/2023 4.4    AST 05/08/2023 16    ALT 05/08/2023 25    Alkaline Phosphatase 05/08/2023 50    Total Bilirubin 05/08/2023 0.7    GFR, Estimated 05/08/2023 >60    Anion gap 05/08/2023 8    IgG (Immunoglobin G), Se* 05/08/2023 640    IgA 05/08/2023 103    IgM (Immunoglobulin M), * 05/08/2023 25    Total Protein ELP 05/08/2023 6.5    Albumin SerPl Elph-Mcnc 05/08/2023 3.7    Alpha 1 05/08/2023 0.4    Alpha2 Glob SerPl  Elph-M* 05/08/2023 0.8    B-Globulin SerPl Elph-Mc* 05/08/2023 0.9    Gamma Glob SerPl Elph-Mc* 05/08/2023 0.6    M Protein SerPl Elph-Mcnc 05/08/2023 Not Observed    Globulin, Total 05/08/2023 2.8    Albumin/Glob SerPl 05/08/2023 1.4    IFE 1 05/08/2023 Comment    Please Note 05/08/2023 Comment   Appointment on 04/24/2023  Component Date Value   WBC Count 04/24/2023 7.9    RBC 04/24/2023 5.40    Hemoglobin 04/24/2023 14.1    HCT 04/24/2023 42.2    MCV 04/24/2023 78.1 (L)    MCH 04/24/2023 26.1    MCHC 04/24/2023 33.4    RDW 04/24/2023 15.1    Platelet Count 04/24/2023 190    nRBC 04/24/2023 0.0    Neutrophils Relative % 04/24/2023 52    Neutro Abs 04/24/2023 4.1    Lymphocytes Relative 04/24/2023 31    Lymphs Abs 04/24/2023 2.5    Monocytes Relative 04/24/2023 11    Monocytes Absolute 04/24/2023 0.8    Eosinophils Relative 04/24/2023 5    Eosinophils Absolute 04/24/2023 0.4    Basophils Relative 04/24/2023 1    Basophils Absolute 04/24/2023 0.1    Immature Granulocytes 04/24/2023 0    Abs Immature Granulocytes 04/24/2023 0.02    Sodium 04/24/2023 136    Potassium 04/24/2023 3.5    Chloride 04/24/2023 104    CO2 04/24/2023 25    Glucose, Bld 04/24/2023 252 (H)    BUN 04/24/2023 13    Creatinine, Ser 04/24/2023 0.71    Calcium 04/24/2023 9.2    Total Protein 04/24/2023 6.1 (L)    Albumin 04/24/2023 4.0    AST 04/24/2023 13 (L)    ALT 04/24/2023 16    Alkaline Phosphatase 04/24/2023 44    Total Bilirubin 04/24/2023 0.5    GFR, Estimated 04/24/2023 >60    Anion gap 04/24/2023 7   Appointment on 04/10/2023  Component Date Value   IgG (Immunoglobin G), Se* 04/10/2023 564 (L)    IgA 04/10/2023 105    IgM (Immunoglobulin M), * 04/10/2023 22    Total Protein ELP 04/10/2023 6.1    Albumin SerPl Elph-Mcnc 04/10/2023 3.6    Alpha 1 04/10/2023 0.3    Alpha2 Glob SerPl Elph-M* 04/10/2023 0.8    B-Globulin SerPl Elph-Mc* 04/10/2023 0.9    Gamma Glob SerPl Elph-Mc*  04/10/2023 0.5    M Protein SerPl Elph-Mcnc 04/10/2023 Not Observed    Globulin, Total 04/10/2023 2.5    Albumin/Glob SerPl 04/10/2023 1.5    IFE 1 04/10/2023 Comment    Please Note 04/10/2023 Comment    Sodium 04/10/2023 135    Potassium 04/10/2023 3.5    Chloride 04/10/2023 103    CO2 04/10/2023 24    Glucose, Bld 04/10/2023 275 (H)    BUN 04/10/2023  12    Creatinine 04/10/2023 0.78    Calcium 04/10/2023 8.8 (L)    Total Protein 04/10/2023 6.5    Albumin 04/10/2023 4.1    AST 04/10/2023 14 (L)    ALT 04/10/2023 25    Alkaline Phosphatase 04/10/2023 47    Total Bilirubin 04/10/2023 0.6    GFR, Estimated 04/10/2023 >60    Anion gap 04/10/2023 8    WBC Count 04/10/2023 7.9    RBC 04/10/2023 5.51    Hemoglobin 04/10/2023 14.3    HCT 04/10/2023 42.9    MCV 04/10/2023 77.9 (L)    MCH 04/10/2023 26.0    MCHC 04/10/2023 33.3    RDW 04/10/2023 14.6    Platelet Count 04/10/2023 200    nRBC 04/10/2023 0.0    Neutrophils Relative % 04/10/2023 58    Neutro Abs 04/10/2023 4.7    Lymphocytes Relative 04/10/2023 31    Lymphs Abs 04/10/2023 2.4    Monocytes Relative 04/10/2023 7    Monocytes Absolute 04/10/2023 0.6    Eosinophils Relative 04/10/2023 2    Eosinophils Absolute 04/10/2023 0.2    Basophils Relative 04/10/2023 1    Basophils Absolute 04/10/2023 0.1    Immature Granulocytes 04/10/2023 1    Abs Immature Granulocytes 04/10/2023 0.04   There may be more visits with results that are not included.   No image results found.   No results found.  NM PET Image Restage (PS) Whole Body  Result Date: 02/23/2023 CLINICAL DATA:  Subsequent treatment strategy for multiple myeloma. EXAM: NUCLEAR MEDICINE PET WHOLE BODY TECHNIQUE: 8.5 mCi F-18 FDG was injected intravenously. Full-ring PET imaging was performed from the head to foot after the radiotracer. CT data was obtained and used for attenuation correction and anatomic localization. Fasting blood glucose: 200 mg/dl COMPARISON:   40/98/1191. FINDINGS: Mediastinal blood pool activity: SUV max 2.3 HEAD/NECK: No abnormal hypermetabolism. Incidental CT findings: None. CHEST: No abnormal hypermetabolism. Incidental CT findings: Heart is enlarged.  No pericardial or pleural effusion. ABDOMEN/PELVIS: No abnormal hypermetabolism. Incidental CT findings: Liver is unremarkable. Stones in the gallbladder. Adrenal glands, kidneys, spleen, pancreas, stomach and bowel are grossly unremarkable. Atherosclerotic calcification of the aorta. SKELETON: Persistent focal uptake in the anterior left second rib corresponds to an old, possibly pathologic, fracture, SUV max 2.5, stable. No new areas of abnormal hypermetabolism. Otherwise, mild patchy uptake in the spine without focality. No new hypermetabolic lesions. Incidental CT findings: Extensive lytic disease throughout the visualized osseous structures, as before. EXTREMITIES: No focal abnormal hypermetabolism. Incidental CT findings: None. IMPRESSION: 1. Persistent mild uptake associated with the anterior left second rib, possibly due to an old pathologic fracture. Mild patchy metabolism throughout the spine is likely treatment related. Extensive lytic disease throughout the visualized osseous structures without new focal areas of abnormal hypermetabolism to suggest metabolically active multiple myeloma. 2. Cholelithiasis. 3.  Aortic atherosclerosis (ICD10-I70.0). Electronically Signed   By: Leanna Battles M.D.   On: 02/23/2023 08:18       Additional Info: This encounter employed real-time, collaborative documentation. The patient actively reviewed and updated their medical record on a shared screen, ensuring transparency and facilitating joint problem-solving for the problem list, overview, and plan. This approach promotes accurate, informed care. The treatment plan was discussed and reviewed in detail, including medication safety, potential side effects, and all patient questions. We confirmed  understanding and comfort with the plan. Follow-up instructions were established, including contacting the office for any concerns, returning if symptoms worsen, persist, or new symptoms develop, and  precautions for potential emergency department visits.

## 2023-09-09 NOTE — Assessment & Plan Note (Signed)
Live translator services used, and information was frequently repeated to confirm understanding.

## 2023-09-09 NOTE — Patient Instructions (Addendum)
  Try switching rice to cauliflower rice (above)   VISIT SUMMARY:  During your recent visit, we discussed your ongoing issues with diabetes, visual impairment, and multiple myeloma. You reported improved blurry vision, but still have difficulty reading. Your blood glucose levels have been ranging from 150 to 200. We also discussed your diet and the importance of reducing starch, particularly white rice. You are currently on several medications, including Metformin, Farxiga, and dexamethasone for your conditions.  YOUR PLAN:  -DIABETES MELLITUS: Diabetes Mellitus is a condition where your body does not properly process sugar, leading to high blood sugar levels. We will order labs to check your A1C level. If it remains above 7, we may need to add another diabetes medication. Please continue taking Metformin and Farxiga as prescribed and try to reduce starch in your diet.  -VISUAL IMPAIRMENT: Your blurry vision, despite improved glucose control, may be due to other factors. We will refer you to an eye doctor, Dr. Alden Hipp, for further evaluation.  -MULTIPLE MYELOMA: Multiple Myeloma is a type of cancer that forms in a type of white blood cell called a plasma cell. You should continue your current treatment with dexamethasone as directed by your oncology specialist. This medication may contribute to your elevated glucose levels and potential cataract formation.  INSTRUCTIONS:  Please follow up in 3 months or sooner if your lab results are concerning. It's important to continue taking your medications as prescribed and try to reduce the amount of starch in your diet. We will also refer you to an eye doctor for further evaluation of your vision issues.

## 2023-09-09 NOTE — Assessment & Plan Note (Signed)
Despite improved glucose control, his blurry vision persists without a recent ophthalmology evaluation. We will refer him to Dr. Alden Hipp for an ophthalmology evaluation.

## 2023-09-10 LAB — HEMOGLOBIN A1C
Hgb A1c MFr Bld: 8.9 %{Hb} — ABNORMAL HIGH (ref <5.7)
Mean Plasma Glucose: 209 mg/dL
eAG (mmol/L): 11.6 mmol/L

## 2023-09-11 ENCOUNTER — Encounter: Payer: Self-pay | Admitting: Internal Medicine

## 2023-09-11 ENCOUNTER — Other Ambulatory Visit: Payer: Self-pay | Admitting: Internal Medicine

## 2023-09-11 ENCOUNTER — Other Ambulatory Visit: Payer: Self-pay

## 2023-09-11 DIAGNOSIS — E1165 Type 2 diabetes mellitus with hyperglycemia: Secondary | ICD-10-CM

## 2023-09-11 DIAGNOSIS — Z7985 Long-term (current) use of injectable non-insulin antidiabetic drugs: Secondary | ICD-10-CM

## 2023-09-11 DIAGNOSIS — Z7984 Long term (current) use of oral hypoglycemic drugs: Secondary | ICD-10-CM

## 2023-09-11 MED ORDER — OZEMPIC (0.25 OR 0.5 MG/DOSE) 2 MG/3ML ~~LOC~~ SOPN: 0.2500 mg | PEN_INJECTOR | SUBCUTANEOUS | 11 refills | Status: DC

## 2023-09-11 MED ORDER — OZEMPIC (0.25 OR 0.5 MG/DOSE) 2 MG/3ML ~~LOC~~ SOPN
0.2500 mg | PEN_INJECTOR | Freq: Every day | SUBCUTANEOUS | 11 refills | Status: DC
Start: 2023-09-11 — End: 2023-09-11

## 2023-09-11 NOTE — Telephone Encounter (Signed)
Changed prescription to weekly instead of daily.

## 2023-09-11 NOTE — Progress Notes (Signed)
f patient has already viewed their MyChart notification, please disregard this message and close the task. If after one week the patient has not viewed the MyChart message:  Call the patient with an interpreter (patient speaks [place]). Explain that their recent HbA1c result was 8.9%, which is higher than their previous result and indicates poor diabetes control. Assist the patient in scheduling a visit within the next 2 weeks to discuss diabetes management. Remind the patient to continue their current medications and to monitor blood sugar levels 2-3 times daily if possible, and bring to the appointment. Emphasize the importance of a healthy diet and regular exercise. Ask if he was able to schedule a diabetic eye exam if the patient hasn't had one recently. Document the call in the patient's chart.  If unable to reach the patient by phone, mail the MyChart message content and lab results to the patient's address on file. Document in the chart: "MyChart message and lab results mailed to patient on [CURRENT DATE]".

## 2023-09-15 ENCOUNTER — Other Ambulatory Visit: Payer: Self-pay | Admitting: Hematology

## 2023-09-15 DIAGNOSIS — C9000 Multiple myeloma not having achieved remission: Secondary | ICD-10-CM

## 2023-09-17 ENCOUNTER — Other Ambulatory Visit: Payer: Self-pay

## 2023-09-17 ENCOUNTER — Encounter: Payer: Self-pay | Admitting: Hematology

## 2023-09-25 ENCOUNTER — Inpatient Hospital Stay: Payer: BC Managed Care – PPO | Admitting: Hematology

## 2023-09-25 ENCOUNTER — Encounter: Payer: Self-pay | Admitting: Hematology

## 2023-09-25 ENCOUNTER — Inpatient Hospital Stay: Payer: BC Managed Care – PPO

## 2023-09-25 ENCOUNTER — Inpatient Hospital Stay: Payer: BC Managed Care – PPO | Attending: Hematology

## 2023-09-25 VITALS — BP 118/67 | HR 65 | Temp 98.7°F | Resp 16

## 2023-09-25 VITALS — BP 132/83 | HR 63 | Temp 97.9°F | Resp 16 | Ht 66.0 in | Wt 171.2 lb

## 2023-09-25 DIAGNOSIS — Z7189 Other specified counseling: Secondary | ICD-10-CM

## 2023-09-25 DIAGNOSIS — Z5112 Encounter for antineoplastic immunotherapy: Secondary | ICD-10-CM | POA: Insufficient documentation

## 2023-09-25 DIAGNOSIS — Z5111 Encounter for antineoplastic chemotherapy: Secondary | ICD-10-CM

## 2023-09-25 DIAGNOSIS — C9001 Multiple myeloma in remission: Secondary | ICD-10-CM

## 2023-09-25 DIAGNOSIS — C9000 Multiple myeloma not having achieved remission: Secondary | ICD-10-CM

## 2023-09-25 DIAGNOSIS — Z79899 Other long term (current) drug therapy: Secondary | ICD-10-CM | POA: Insufficient documentation

## 2023-09-25 LAB — CBC WITH DIFFERENTIAL (CANCER CENTER ONLY)
Abs Immature Granulocytes: 0.02 10*3/uL (ref 0.00–0.07)
Basophils Absolute: 0.1 10*3/uL (ref 0.0–0.1)
Basophils Relative: 1 %
Eosinophils Absolute: 0.2 10*3/uL (ref 0.0–0.5)
Eosinophils Relative: 3 %
HCT: 42.1 % (ref 39.0–52.0)
Hemoglobin: 13.6 g/dL (ref 13.0–17.0)
Immature Granulocytes: 0 %
Lymphocytes Relative: 40 %
Lymphs Abs: 3.3 10*3/uL (ref 0.7–4.0)
MCH: 24.6 pg — ABNORMAL LOW (ref 26.0–34.0)
MCHC: 32.3 g/dL (ref 30.0–36.0)
MCV: 76.1 fL — ABNORMAL LOW (ref 80.0–100.0)
Monocytes Absolute: 0.9 10*3/uL (ref 0.1–1.0)
Monocytes Relative: 11 %
Neutro Abs: 3.7 10*3/uL (ref 1.7–7.7)
Neutrophils Relative %: 45 %
Platelet Count: 226 10*3/uL (ref 150–400)
RBC: 5.53 MIL/uL (ref 4.22–5.81)
RDW: 17.6 % — ABNORMAL HIGH (ref 11.5–15.5)
WBC Count: 8.2 10*3/uL (ref 4.0–10.5)
nRBC: 0 % (ref 0.0–0.2)

## 2023-09-25 LAB — COMPREHENSIVE METABOLIC PANEL
ALT: 21 U/L (ref 0–44)
AST: 15 U/L (ref 15–41)
Albumin: 3.9 g/dL (ref 3.5–5.0)
Alkaline Phosphatase: 67 U/L (ref 38–126)
Anion gap: 6 (ref 5–15)
BUN: 12 mg/dL (ref 6–20)
CO2: 26 mmol/L (ref 22–32)
Calcium: 9 mg/dL (ref 8.9–10.3)
Chloride: 105 mmol/L (ref 98–111)
Creatinine, Ser: 0.78 mg/dL (ref 0.61–1.24)
GFR, Estimated: >60 mL/min (ref >60)
Glucose, Bld: 159 mg/dL — ABNORMAL HIGH (ref 70–99)
Potassium: 3.5 mmol/L (ref 3.5–5.1)
Sodium: 137 mmol/L (ref 135–145)
Total Bilirubin: 0.5 mg/dL (ref 0.3–1.2)
Total Protein: 6.8 g/dL (ref 6.5–8.1)

## 2023-09-25 MED ORDER — DEXAMETHASONE SODIUM PHOSPHATE 10 MG/ML IJ SOLN
2.0000 mg | Freq: Once | INTRAMUSCULAR | Status: AC
  Administered 2023-09-25: 2 mg via INTRAVENOUS
  Filled 2023-09-25: qty 1

## 2023-09-25 MED ORDER — ACETAMINOPHEN 325 MG PO TABS
650.0000 mg | ORAL_TABLET | Freq: Once | ORAL | Status: AC
  Administered 2023-09-25: 650 mg via ORAL
  Filled 2023-09-25: qty 2

## 2023-09-25 MED ORDER — SODIUM CHLORIDE 0.9 % IV SOLN
16.0000 mg/kg | Freq: Once | INTRAVENOUS | Status: AC
  Administered 2023-09-25: 1300 mg via INTRAVENOUS
  Filled 2023-09-25: qty 5

## 2023-09-25 MED ORDER — ZOLEDRONIC ACID 4 MG/100ML IV SOLN
4.0000 mg | Freq: Once | INTRAVENOUS | Status: AC
  Administered 2023-09-25: 4 mg via INTRAVENOUS
  Filled 2023-09-25: qty 100

## 2023-09-25 MED ORDER — SODIUM CHLORIDE 0.9 % IV SOLN: Freq: Once | INTRAVENOUS | Status: AC

## 2023-09-25 MED ORDER — DIPHENHYDRAMINE HCL 25 MG PO CAPS
50.0000 mg | ORAL_CAPSULE | Freq: Once | ORAL | Status: AC
  Administered 2023-09-25: 50 mg via ORAL
  Filled 2023-09-25: qty 2

## 2023-09-25 MED ORDER — FAMOTIDINE IN NACL 20-0.9 MG/50ML-% IV SOLN
20.0000 mg | Freq: Once | INTRAVENOUS | Status: AC
  Administered 2023-09-25: 20 mg via INTRAVENOUS
  Filled 2023-09-25: qty 50

## 2023-09-25 MED ORDER — MONTELUKAST SODIUM 10 MG PO TABS
10.0000 mg | ORAL_TABLET | Freq: Once | ORAL | Status: AC
  Administered 2023-09-25: 10 mg via ORAL
  Filled 2023-09-25: qty 1

## 2023-09-25 NOTE — Progress Notes (Signed)
Pt given Revlimid rx on 09/25/23. Pt to start taking on 09/26/23 and take for 21 days:09/26/23 - 10/16/23. Pt will take 7 days off from 10/17/23 - 10/23/23. Pt will restart next cycle on 10/24/23. Pt is aware of dates and verbalizes understanding.

## 2023-09-25 NOTE — Progress Notes (Signed)
HEMATOLOGY/ONCOLOGY CLINIC NOTE  Date of Service: 09/25/23  Chief complaint - Follow-up for continued evaluation and management of high risk multiple myeloma  Current treatment-daratumumab/Revlimid/dexamethasone Has not f/u on and does not appear keen to consider Melphalan/HDT-AUTOHSCT  INTERVAL HISTORY:  Donald Suarez is a 57 y.o. male is here for continued evaluation and management of his multiple myeloma. Patient was last seen by me on 08/28/2023 and reported a headache, 7-pound weight loss in the last month, throat pain, and chills.   Today, he is scheduled to receive day 1 cycle 13 of treatment and is accompanied by an interpreter. He reports that he has tolerated his treatment well. He continues to take Revlimid regularly and denies any new or severe toxicities.   No infection issues, new bone pains, fever, chills, night sweats, leg swelling, diarrhea, constipation, new bone pain, back pain, new headaches, or leg swelling. His breathing habits and energy levels have been normal. Patient reports that he has received COVID-19 and influenza vaccines recently.   MEDICAL HISTORY:  Past Medical History:  Diagnosis Date   Abfraction 08/06/2022   Accretions on teeth 08/06/2022   Anemia    Attrition, teeth excessive 08/06/2022   Caries 08/06/2022   Defective dental restoration 08/06/2022   Elevated ferritin 08/07/2022   Hypocalcemia 08/19/2022   In setting of multiple myeloma    Loose, teeth 08/06/2022   Malocclusion 08/06/2022   Microalbuminuria 08/19/2022   Periodontal disease 08/06/2022   Peripheral neuropathy 09/08/2022   Started 08/2022 A/w diabetes and revlimid usage Burning on top of left foot   Teeth missing 08/06/2022    SURGICAL HISTORY: Past Surgical History:  Procedure Laterality Date   APPENDECTOMY     Issaac BONE MARROW BIOPSY & ASPIRATION  02/11/2023   Jequan FLUORO GUIDE CV LINE RIGHT  05/15/2022   Jamel PATIENT EVAL TECH 0-60 MINS  05/19/2022   Xzander US GUIDE VASC ACCESS RIGHT   05/15/2022    SOCIAL HISTORY: Social History   Socioeconomic History   Marital status: Married    Spouse name: Not on file   Number of children: Not on file   Years of education: Not on file   Highest education level: Not on file  Occupational History   Not on file  Tobacco Use   Smoking status: Never   Smokeless tobacco: Never  Vaping Use   Vaping status: Never Used  Substance and Sexual Activity   Alcohol use: Never   Drug use: Never   Sexual activity: Not on file  Other Topics Concern   Not on file  Social History Narrative   Not on file   Social Determinants of Health   Financial Resource Strain: Medium Risk (05/21/2022)   Overall Financial Resource Strain (CARDIA)    Difficulty of Paying Living Expenses: Somewhat hard  Food Insecurity: Food Insecurity Present (05/21/2022)   Hunger Vital Sign    Worried About Running Out of Food in the Last Year: Sometimes true    Ran Out of Food in the Last Year: Sometimes true  Transportation Needs: No Transportation Needs (05/21/2022)   PRAPARE - Administrator, Civil Service (Medical): No    Lack of Transportation (Non-Medical): No  Physical Activity: Not on file  Stress: Not on file  Social Connections: Not on file  Intimate Partner Violence: Not on file    FAMILY HISTORY: No family history on file.  ALLERGIES:  has No Known Allergies.  MEDICATIONS:  . Allergies as of 09/25/2023   No  Known Allergies      Medication List        Accurate as of September 25, 2023  8:41 AM. If you have any questions, ask your nurse or doctor.          Accu-Chek Guide test strip Generic drug: glucose blood Use to test blood sugars up to 4 times daily as directed.   Accu-Chek Guide w/Device Kit Use to test blood sugars up to 4 times daily as directed.   Accu-Chek Softclix Lancets lancets Use to test blood sugars up to 4 times daily as directed.   acyclovir 400 MG tablet Commonly known as: ZOVIRAX Take 1 tablet  (400 mg total) by mouth 2 (two) times daily.   aspirin EC 81 MG tablet Take 1 tablet (81 mg total) by mouth daily. Swallow whole.   azithromycin 250 MG tablet Commonly known as: Zithromax Z-Pak 500mg  po x 1 day then 250mg  po daily for 4 days   B-12 1000 MCG Caps Take 1 tablet by mouth daily.   dapagliflozin propanediol 10 MG Tabs tablet Commonly known as: Farxiga Take 1 tablet (10 mg total) by mouth daily before breakfast.   gabapentin 300 MG capsule Commonly known as: NEURONTIN Take 1 capsule (300 mg total) by mouth 3 (three) times daily.   Klor-Con M20 20 MEQ tablet Generic drug: potassium chloride SA TAKE 1 TABLET BY MOUTH EVERY DAY   lenalidomide 25 MG capsule Commonly known as: REVLIMID TAKE 1 CAPSULE BY MOUTH 1 TIME A DAY FOR 21 DAYS ON THEN 7 DAYS OFF   metFORMIN 500 MG tablet Commonly known as: GLUCOPHAGE Take 2 tablets (1,000 mg total) by mouth 2 (two) times daily with a meal. Note dose increased. Replaces 500 mg twice daily.   ondansetron 8 MG tablet Commonly known as: Zofran Take 1 tablet (8 mg total) by mouth every 8 (eight) hours as needed for nausea or vomiting.   Ozempic (0.25 or 0.5 MG/DOSE) 2 MG/3ML Sopn Generic drug: Semaglutide(0.25 or 0.5MG /DOS) Inject 0.25 mg into the skin once a week.   prochlorperazine 10 MG tablet Commonly known as: COMPAZINE Take 1 tablet (10 mg total) by mouth every 6 (six) hours as needed for nausea or vomiting.         REVIEW OF SYSTEMS:    10 Point review of Systems was done is negative except as noted above.   PHYSICAL EXAMINATION: .There were no vitals taken for this visit.   GENERAL:alert, in no acute distress and comfortable SKIN: no acute rashes, no significant lesions EYES: conjunctiva are pink and non-injected, sclera anicteric OROPHARYNX: MMM, no exudates, no oropharyngeal erythema or ulceration NECK: supple, no JVD LYMPH:  no palpable lymphadenopathy in the cervical, axillary or inguinal  regions LUNGS: clear to auscultation b/l with normal respiratory effort HEART: regular rate & rhythm ABDOMEN:  normoactive bowel sounds , non tender, not distended. Extremity: no pedal edema PSYCH: alert & oriented x 3 with fluent speech NEURO: no focal motor/sensory deficits     LABORATORY DATA:  I have reviewed the data as listed  .    Latest Ref Rng & Units 09/09/2023    9:34 AM 08/28/2023    9:25 AM 07/31/2023   10:21 AM  CBC  WBC 4.0 - 10.5 K/uL 8.9  9.9  9.4   Hemoglobin 13.0 - 17.0 g/dL 88.4  16.6  06.3   Hematocrit 39.0 - 52.0 % 41.2  39.4  40.9   Platelets 150.0 - 400.0 K/uL 280.0  252  239     .    Latest Ref Rng & Units 09/09/2023    9:34 AM 08/28/2023    9:25 AM 07/31/2023   12:28 PM  CMP  Glucose 70 - 99 mg/dL 161  096  045   BUN 6 - 23 mg/dL 9  10  14    Creatinine 0.40 - 1.50 mg/dL 4.09  8.11  9.14   Sodium 135 - 145 mEq/L 136  135  137   Potassium 3.5 - 5.1 mEq/L 4.1  3.5  3.5   Chloride 96 - 112 mEq/L 98  100  104   CO2 19 - 32 mEq/L 29  25  25    Calcium 8.4 - 10.5 mg/dL 9.8  8.8  8.4   Total Protein 6.0 - 8.3 g/dL 7.4  7.2  6.6   Total Bilirubin 0.2 - 1.2 mg/dL 0.5  0.6  0.6   Alkaline Phos 39 - 117 U/L 85  111  72   AST 0 - 37 U/L 18  14  12    ALT 0 - 53 U/L 34  40  24    . Lab Results  Component Value Date   LDH 114 10/10/2022    Bone marrow biopsy 02/11/2023   RADIOGRAPHIC STUDIES: I have personally reviewed the radiological images as listed and agreed with the findings in the report. No results found.  ASSESSMENT & PLAN:   57 y.o. 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  Patient diagnosed December 2021 and received 1 cycle of CyBorD. Had previously presented with anemia renal insufficiency and hyperviscosity symptoms. Bx- 90% plasma cells Initial M spike 8.03g/dl NWG(9F): Not Detected  Dup(1q): Not Detected  Gains(15): DETECTED   Gains(5 and 9): Not Detected  Del(13q)/-13: DETECTED  Del(17p)(TP53): Not Detected  IGH(Rearrangement): SEE BELOW   IgH complex: t(4;14): DETECTED  t(11;14): Not Detected  t(14;16): Not Detected  t(14;20): Not Detected   Cytogenetics: Normal male karyotype.   -Labs from 05/13/2022-beta-2 microglobulin 4.4, LDH 197, lambda free light chain 77.1, kappa, lambda light chain ratio 0.08, multiple myeloma panel pending.  UPEP ordered but not yet collected. 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  Due to multiple myeloma   PLAN  -Discussed lab results on 09/25/23 in detail with patient. CBC normal, showed WBC of 8.2K, hemoglobin of 13.6, and platelets of 226K. -CMP normal -last myeloma panel from 08/28/2023 showed M protein level of 0.2 g/dL -myeloma panel from today is pending -Continue maintenance monthly 15 mg Revlimid po 3 weeks on 1 week off and Daratumumab for high risk multiple myeloma  -discussed option for patient to receive a bone marrow transplant for high risk multiple myeloma -Patient would not like to have a referral placed to [facility] for consideration of a bone marrow transplant at this time -advised patient to stay UTD with his age-appropriate vaccines including influenza and COVID-19 booster  Follow-up ***  The total time spent in the appointment  was *** minutes* .  All of the patient's questions were answered with apparent satisfaction. The patient knows to call the  clinic with any problems, questions or concerns.   Wyvonnia Lora MD MS AAHIVMS [facility] Hematology/Oncology Physician [facility]  .*Total Encounter Time as defined by the Centers for Medicare and Medicaid Services includes, in addition to the face-to-face time of a patient visit (documented in the note above) non-face-to-face time: obtaining and reviewing outside history, ordering and reviewing medications, tests or procedures, care coordination (communications with other health care professionals or caregivers) and documentation in the medical record.    I,Mitra Faeizi,acting as a Neurosurgeon for Wyvonnia Lora, MD.,have documented all relevant documentation on the behalf of Wyvonnia Lora, MD,as directed by  Wyvonnia Lora, MD while in the presence of Wyvonnia Lora, MD.  ***

## 2023-09-25 NOTE — Progress Notes (Unsigned)
Patient seen by Dr. Kale  Vitals are within treatment parameters.  Labs reviewed: and are within treatment parameters.  Per physician team, patient is ready for treatment and there are NO modifications to the treatment plan.  

## 2023-09-25 NOTE — Patient Instructions (Signed)
[facility]  Discharge Instructions: Thank you for choosing [facility] to provide your oncology and hematology care.   If you have a lab appointment with the Cancer Center, please go directly to the Cancer Center and check in at the registration area.   Wear comfortable clothing and clothing appropriate for easy access to any Portacath or PICC line.   We strive to give you quality time with your provider. You may need to reschedule your appointment if you arrive late (15 or more minutes).  Arriving late affects you and other patients whose appointments are after yours.  Also, if you miss three or more appointments without notifying the office, you may be dismissed from the clinic at the provider's discretion.      For prescription refill requests, have your pharmacy contact our office and allow 72 hours for refills to be completed.    Today you received the following chemotherapy and/or immunotherapy agents: Daratumumab.       To help prevent nausea and vomiting after your treatment, we encourage you to take your nausea medication as directed.  BELOW ARE SYMPTOMS THAT SHOULD BE REPORTED IMMEDIATELY: *FEVER GREATER THAN 100.4 F (38 C) OR HIGHER *CHILLS OR SWEATING *NAUSEA AND VOMITING THAT IS NOT CONTROLLED WITH YOUR NAUSEA MEDICATION *UNUSUAL SHORTNESS OF BREATH *UNUSUAL BRUISING OR BLEEDING *URINARY PROBLEMS (pain or burning when urinating, or frequent urination) *BOWEL PROBLEMS (unusual diarrhea, constipation, pain near the anus) TENDERNESS IN MOUTH AND THROAT WITH OR WITHOUT PRESENCE OF ULCERS (sore throat, sores in mouth, or a toothache) UNUSUAL RASH, SWELLING OR PAIN  UNUSUAL VAGINAL DISCHARGE OR ITCHING   Items with * indicate a potential emergency and should be followed up as soon as possible or go to the Emergency Department if any problems should occur.  Please show the CHEMOTHERAPY ALERT CARD or IMMUNOTHERAPY ALERT CARD at  check-in to the Emergency Department and triage nurse.  Should you have questions after your visit or need to cancel or reschedule your appointment, please contact [facility]  Dept: [phone]  and follow the prompts.  Office hours are 8:00 a.m. to 4:30 p.m. Monday - Friday. Please note that voicemails left after 4:00 p.m. may not be returned until the following business day.  We are closed weekends and major holidays. You have access to a nurse at all times for urgent questions. Please call the main number to the clinic Dept: [phone] and follow the prompts.   For any non-urgent questions, you may also contact your provider using MyChart. We now offer e-Visits for anyone 18 and older to request care online for non-urgent symptoms. For details visit [email or web address].   Also download the MyChart app! Go to the app store, search "MyChart", open the app, select [facility], and log in with your MyChart username and password.   

## 2023-09-29 LAB — MULTIPLE MYELOMA PANEL, SERUM
Albumin SerPl Elph-Mcnc: 3.5 g/dL (ref 2.9–4.4)
Albumin/Glob SerPl: 1.3 (ref 0.7–1.7)
Alpha 1: 0.1 g/dL (ref 0.0–0.4)
Alpha2 Glob SerPl Elph-Mcnc: 1.2 g/dL — ABNORMAL HIGH (ref 0.4–1.0)
B-Globulin SerPl Elph-Mcnc: 1 g/dL (ref 0.7–1.3)
Gamma Glob SerPl Elph-Mcnc: 0.5 g/dL (ref 0.4–1.8)
Globulin, Total: 2.8 g/dL (ref 2.2–3.9)
IgA: 160 mg/dL (ref 90–386)
IgG (Immunoglobin G), Serum: 659 mg/dL (ref 603–1613)
IgM (Immunoglobulin M), Srm: 30 mg/dL (ref 20–172)
M Protein SerPl Elph-Mcnc: 0.2 g/dL — ABNORMAL HIGH
Total Protein ELP: 6.3 g/dL (ref 6.0–8.5)

## 2023-10-01 ENCOUNTER — Encounter: Payer: Self-pay | Admitting: Hematology

## 2023-10-13 ENCOUNTER — Other Ambulatory Visit: Payer: Self-pay | Admitting: Hematology

## 2023-10-13 DIAGNOSIS — C9000 Multiple myeloma not having achieved remission: Secondary | ICD-10-CM

## 2023-10-15 ENCOUNTER — Encounter: Payer: Self-pay | Admitting: Hematology

## 2023-10-15 ENCOUNTER — Other Ambulatory Visit: Payer: Self-pay

## 2023-10-23 ENCOUNTER — Inpatient Hospital Stay: Payer: BC Managed Care – PPO

## 2023-10-23 ENCOUNTER — Inpatient Hospital Stay: Payer: BC Managed Care – PPO | Attending: Hematology

## 2023-10-23 VITALS — BP 144/83 | HR 67 | Temp 98.1°F | Resp 16 | Wt 177.5 lb

## 2023-10-23 DIAGNOSIS — C9000 Multiple myeloma not having achieved remission: Secondary | ICD-10-CM | POA: Diagnosis present

## 2023-10-23 DIAGNOSIS — Z5112 Encounter for antineoplastic immunotherapy: Secondary | ICD-10-CM | POA: Diagnosis not present

## 2023-10-23 DIAGNOSIS — C9001 Multiple myeloma in remission: Secondary | ICD-10-CM

## 2023-10-23 DIAGNOSIS — Z7189 Other specified counseling: Secondary | ICD-10-CM

## 2023-10-23 DIAGNOSIS — Z79899 Other long term (current) drug therapy: Secondary | ICD-10-CM | POA: Insufficient documentation

## 2023-10-23 LAB — CBC WITH DIFFERENTIAL (CANCER CENTER ONLY)
Abs Immature Granulocytes: 0.03 10*3/uL (ref 0.00–0.07)
Basophils Absolute: 0.1 10*3/uL (ref 0.0–0.1)
Basophils Relative: 2 %
Eosinophils Absolute: 0.7 10*3/uL — ABNORMAL HIGH (ref 0.0–0.5)
Eosinophils Relative: 7 %
HCT: 43.5 % (ref 39.0–52.0)
Hemoglobin: 14.3 g/dL (ref 13.0–17.0)
Immature Granulocytes: 0 %
Lymphocytes Relative: 33 %
Lymphs Abs: 3.1 10*3/uL (ref 0.7–4.0)
MCH: 25.4 pg — ABNORMAL LOW (ref 26.0–34.0)
MCHC: 32.9 g/dL (ref 30.0–36.0)
MCV: 77.1 fL — ABNORMAL LOW (ref 80.0–100.0)
Monocytes Absolute: 0.9 10*3/uL (ref 0.1–1.0)
Monocytes Relative: 9 %
Neutro Abs: 4.4 10*3/uL (ref 1.7–7.7)
Neutrophils Relative %: 49 %
Platelet Count: 218 10*3/uL (ref 150–400)
RBC: 5.64 MIL/uL (ref 4.22–5.81)
RDW: 19.2 % — ABNORMAL HIGH (ref 11.5–15.5)
WBC Count: 9.2 10*3/uL (ref 4.0–10.5)
nRBC: 0 % (ref 0.0–0.2)

## 2023-10-23 LAB — COMPREHENSIVE METABOLIC PANEL
ALT: 20 U/L (ref 0–44)
AST: 13 U/L — ABNORMAL LOW (ref 15–41)
Albumin: 4.1 g/dL (ref 3.5–5.0)
Alkaline Phosphatase: 62 U/L (ref 38–126)
Anion gap: 5 (ref 5–15)
BUN: 13 mg/dL (ref 6–20)
CO2: 26 mmol/L (ref 22–32)
Calcium: 9 mg/dL (ref 8.9–10.3)
Chloride: 108 mmol/L (ref 98–111)
Creatinine, Ser: 0.76 mg/dL (ref 0.61–1.24)
GFR, Estimated: >60 mL/min (ref >60)
Glucose, Bld: 230 mg/dL — ABNORMAL HIGH (ref 70–99)
Potassium: 3.4 mmol/L — ABNORMAL LOW (ref 3.5–5.1)
Sodium: 139 mmol/L (ref 135–145)
Total Bilirubin: 0.9 mg/dL (ref 0.3–1.2)
Total Protein: 6.6 g/dL (ref 6.5–8.1)

## 2023-10-23 MED ORDER — ZOLEDRONIC ACID 4 MG/100ML IV SOLN
4.0000 mg | Freq: Once | INTRAVENOUS | Status: AC
Start: 2023-10-23 — End: 2023-10-23
  Administered 2023-10-23: 4 mg via INTRAVENOUS
  Filled 2023-10-23: qty 100

## 2023-10-23 MED ORDER — MONTELUKAST SODIUM 10 MG PO TABS
10.0000 mg | ORAL_TABLET | Freq: Once | ORAL | Status: AC
  Administered 2023-10-23: 10 mg via ORAL
  Filled 2023-10-23: qty 1

## 2023-10-23 MED ORDER — DIPHENHYDRAMINE HCL 25 MG PO CAPS
50.0000 mg | ORAL_CAPSULE | Freq: Once | ORAL | Status: AC
  Administered 2023-10-23: 50 mg via ORAL
  Filled 2023-10-23: qty 2

## 2023-10-23 MED ORDER — FAMOTIDINE IN NACL 20-0.9 MG/50ML-% IV SOLN
20.0000 mg | Freq: Once | INTRAVENOUS | Status: AC
  Administered 2023-10-23: 20 mg via INTRAVENOUS
  Filled 2023-10-23: qty 50

## 2023-10-23 MED ORDER — DARATUMUMAB CHEMO INJECTION 400 MG/20ML
16.0000 mg/kg | Freq: Once | INTRAVENOUS | Status: AC
  Administered 2023-10-23: 1300 mg via INTRAVENOUS
  Filled 2023-10-23: qty 5

## 2023-10-23 MED ORDER — SODIUM CHLORIDE 0.9 % IV SOLN: Freq: Once | INTRAVENOUS | Status: AC

## 2023-10-23 MED ORDER — ACETAMINOPHEN 325 MG PO TABS
650.0000 mg | ORAL_TABLET | Freq: Once | ORAL | Status: AC
Start: 2023-10-23 — End: 2023-10-23
  Administered 2023-10-23: 650 mg via ORAL
  Filled 2023-10-23: qty 2

## 2023-10-23 MED ORDER — DEXAMETHASONE SODIUM PHOSPHATE 10 MG/ML IJ SOLN
2.0000 mg | Freq: Once | INTRAMUSCULAR | Status: AC
Start: 2023-10-23 — End: 2023-10-23
  Administered 2023-10-23: 2 mg via INTRAVENOUS
  Filled 2023-10-23: qty 1

## 2023-10-23 NOTE — Patient Instructions (Signed)
[place] [facility]  Discharge Instructions: Thank you for choosing [facility] to provide your oncology and hematology care.   If you have a lab appointment with the Cancer Center, please go directly to the Cancer Center and check in at the registration area.   Wear comfortable clothing and clothing appropriate for easy access to any Portacath or PICC line.   We strive to give you quality time with your provider. You may need to reschedule your appointment if you arrive late (15 or more minutes).  Arriving late affects you and other patients whose appointments are after yours.  Also, if you miss three or more appointments without notifying the office, you may be dismissed from the clinic at the provider's discretion.      For prescription refill requests, have your pharmacy contact our office and allow 72 hours for refills to be completed.    Today you received the following chemotherapy and/or immunotherapy agents: Rapid Darzalex      To help prevent nausea and vomiting after your treatment, we encourage you to take your nausea medication as directed.  BELOW ARE SYMPTOMS THAT SHOULD BE REPORTED IMMEDIATELY: *FEVER GREATER THAN 100.4 F (38 C) OR HIGHER *CHILLS OR SWEATING *NAUSEA AND VOMITING THAT IS NOT CONTROLLED WITH YOUR NAUSEA MEDICATION *UNUSUAL SHORTNESS OF BREATH *UNUSUAL BRUISING OR BLEEDING *URINARY PROBLEMS (pain or burning when urinating, or frequent urination) *BOWEL PROBLEMS (unusual diarrhea, constipation, pain near the anus) TENDERNESS IN MOUTH AND THROAT WITH OR WITHOUT PRESENCE OF ULCERS (sore throat, sores in mouth, or a toothache) UNUSUAL RASH, SWELLING OR PAIN  UNUSUAL VAGINAL DISCHARGE OR ITCHING   Items with * indicate a potential emergency and should be followed up as soon as possible or go to the Emergency Department if any problems should occur.  Please show the CHEMOTHERAPY ALERT CARD or IMMUNOTHERAPY ALERT CARD at  check-in to the Emergency Department and triage nurse.  Should you have questions after your visit or need to cancel or reschedule your appointment, please contact [place] [facility]  Dept: [phone]  and follow the prompts.  Office hours are 8:00 a.m. to 4:30 p.m. Monday - Friday. Please note that voicemails left after 4:00 p.m. may not be returned until the following business day.  We are closed weekends and major holidays. You have access to a nurse at all times for urgent questions. Please call the main number to the clinic Dept: [phone] and follow the prompts.   For any non-urgent questions, you may also contact your provider using MyChart. We now offer e-Visits for anyone 6 and older to request care online for non-urgent symptoms. For details visit [email or web address].   Also download the MyChart app! Go to the app store, search "MyChart", open the app, select [place], and log in with your MyChart username and password.

## 2023-10-26 NOTE — Progress Notes (Signed)
Pt given Revlimid rx on 19/28/24. Pt to start taking on 10/26/23 and take for 21 days:10/26/23 - 11/15/23. Pt will take 7 days off from 11/16/23 - 11/22/23. Pt will restart next cycle on 11/23/23. Pt is aware of dates and verbalizes understanding.

## 2023-10-28 LAB — MULTIPLE MYELOMA PANEL, SERUM
Albumin SerPl Elph-Mcnc: 3.6 g/dL (ref 2.9–4.4)
Albumin/Glob SerPl: 1.5 (ref 0.7–1.7)
Alpha 1: 0.3 g/dL (ref 0.0–0.4)
Alpha2 Glob SerPl Elph-Mcnc: 0.7 g/dL (ref 0.4–1.0)
B-Globulin SerPl Elph-Mcnc: 0.9 g/dL (ref 0.7–1.3)
Gamma Glob SerPl Elph-Mcnc: 0.6 g/dL (ref 0.4–1.8)
Globulin, Total: 2.5 g/dL (ref 2.2–3.9)
IgA: 145 mg/dL (ref 90–386)
IgG (Immunoglobin G), Serum: 689 mg/dL (ref 603–1613)
IgM (Immunoglobulin M), Srm: 26 mg/dL (ref 20–172)
M Protein SerPl Elph-Mcnc: 0.2 g/dL — ABNORMAL HIGH
Total Protein ELP: 6.1 g/dL (ref 6.0–8.5)

## 2023-11-07 IMAGING — CT NM PET IMAGE RESTAGE (PS) WHOLE BODY
1 of 7 series · 4 of 25 positions shown · non-contrast
Comparison: Bone survey May 14, 2022.

CLINICAL DATA: Initial treatment strategy for high-risk myeloma.

EXAM:
NUCLEAR MEDICINE PET WHOLE BODY
TECHNIQUE: 8.06 mCi F-18 FDG was injected intravenously. Full-ring PET imaging
was performed from the head to foot after the radiotracer. CT data
was obtained and used for attenuation correction and anatomic
localization.
Fasting blood glucose: 161 mg/dl

[Series 4: ct wb 5.0 hd_fov · axial · 5.0mm · 1.17mm/px · z∈[+35,+1075]mm · 4 of 434 slices shown]
[im 87/434  soft-tissue]
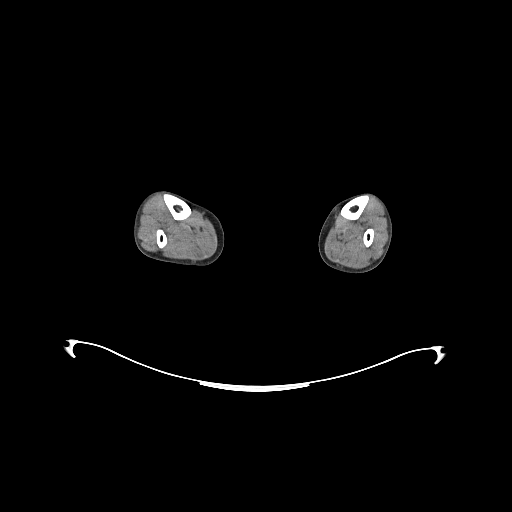
[im 174/434  soft-tissue]
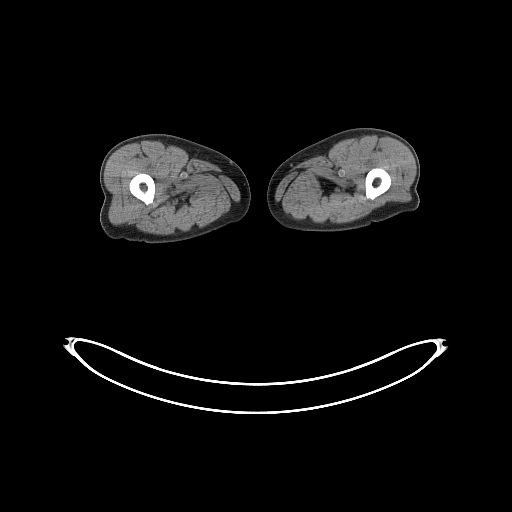
[im 260/434  soft-tissue]
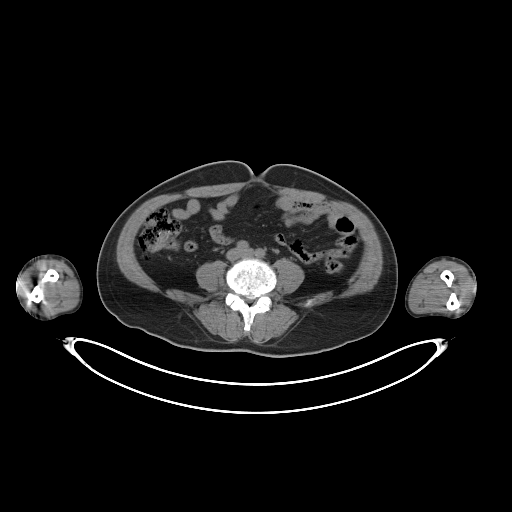
[im 347/434  soft-tissue]
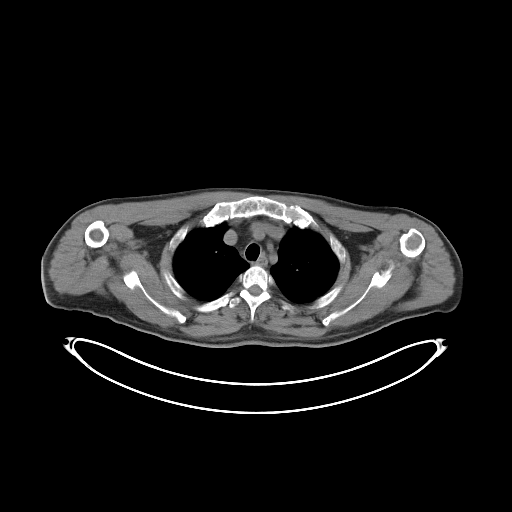

[4 of 25 positions shown; findings below may reference images not displayed]

FINDINGS: Mediastinal blood pool activity: SUV max

HEAD/NECK: No hypermetabolic activity in the scalp. No
hypermetabolic cervical lymph nodes.

Incidental CT findings: none

CHEST: No hypermetabolic mediastinal or hilar nodes. No suspicious
pulmonary nodules on the CT scan.

Incidental CT findings: Aortic atherosclerosis without aneurysmal
dilation of the thoracic aorta. The top normal heart size without
pericardial effusion. Normal caliber central pulmonary vessels.
Limited assessment of cardiovascular structures given lack of
intravenous contrast.

ABDOMEN/PELVIS: No abnormal hypermetabolic activity within the
liver, pancreas, adrenal glands, or spleen. No hypermetabolic lymph
nodes in the abdomen or pelvis.

Incidental CT findings: Cholelithiasis. No acute findings relative
to liver or gallbladder. Pancreas without signs of inflammation.
Spleen, adrenal glands, kidneys, stomach, small and large bowel
without acute process.

SKELETON: Diffuse lytic changes throughout the visualized skeleton.
Diffuse marrow uptake of FDG.

Lytic LEFT clavicular lesion of the distal LEFT clavicle likely
associated with nondisplaced pathologic fracture measuring 2.9 cm
with a maximum SUV of 3.13.

Expansile lytic focus in the RIGHT posterior eleventh rib (image
133/4) maximum SUV 2.98.

Marrow space uptake in the bilateral proximal humeri with scattered
lytic foci throughout the humeri.

Heterogeneous lytic changes of the diffuse nature throughout the
spine, ribs and pelvis. Larger areas of lytic change in the pelvis
and sacrum (image 189/4) 2.2 cm area of lytic change shows a maximum
SUV of 2.91.

Similar size RIGHT posterior iliac lytic area with a maximum SUV of
3.12.

RIGHT acetabular lesion (image 211/4) 16 mm with a maximum SUV of
1.81 scattered lytic foci throughout the pelvis of varying size.

L4 as an example of marrow space activity in the spine with a
maximum SUV of 3.25.

Incidental CT findings: None

EXTREMITIES: Relative sparing of the lower extremities below the
proximal femoral level. Proximal femora with small lytic foci and
generalized marrow uptake similar to that seen in the pelvis and
spine. Relative sparing also bilateral upper extremity, signs of
mild marrow uptake in the proximal humeri.

Incidental CT findings: none
IMPRESSION: Diffuse marrow uptake in the setting of diffuse lytic changes
suspicious for widespread involvement from multiple myeloma.

Mild increased metabolic activity associated with expansile lytic
areas as outlined above.

No signs of nodal or focal solid organ uptake.

## 2023-11-10 ENCOUNTER — Other Ambulatory Visit: Payer: Self-pay | Admitting: Hematology

## 2023-11-10 DIAGNOSIS — C9000 Multiple myeloma not having achieved remission: Secondary | ICD-10-CM

## 2023-11-11 ENCOUNTER — Encounter: Payer: Self-pay | Admitting: Hematology

## 2023-11-20 ENCOUNTER — Other Ambulatory Visit: Payer: BC Managed Care – PPO

## 2023-11-20 ENCOUNTER — Inpatient Hospital Stay: Payer: BC Managed Care – PPO | Attending: Hematology

## 2023-11-20 ENCOUNTER — Inpatient Hospital Stay: Payer: BC Managed Care – PPO | Admitting: Hematology

## 2023-11-20 ENCOUNTER — Inpatient Hospital Stay: Payer: BC Managed Care – PPO

## 2023-11-20 VITALS — BP 113/74 | HR 74 | Resp 16

## 2023-11-20 VITALS — BP 141/84 | HR 80 | Temp 98.1°F | Resp 17 | Wt 179.8 lb

## 2023-11-20 DIAGNOSIS — Z7984 Long term (current) use of oral hypoglycemic drugs: Secondary | ICD-10-CM | POA: Diagnosis not present

## 2023-11-20 DIAGNOSIS — Z7189 Other specified counseling: Secondary | ICD-10-CM

## 2023-11-20 DIAGNOSIS — C9000 Multiple myeloma not having achieved remission: Secondary | ICD-10-CM | POA: Insufficient documentation

## 2023-11-20 DIAGNOSIS — N289 Disorder of kidney and ureter, unspecified: Secondary | ICD-10-CM | POA: Diagnosis not present

## 2023-11-20 DIAGNOSIS — E1165 Type 2 diabetes mellitus with hyperglycemia: Secondary | ICD-10-CM | POA: Insufficient documentation

## 2023-11-20 DIAGNOSIS — C9001 Multiple myeloma in remission: Secondary | ICD-10-CM

## 2023-11-20 DIAGNOSIS — E1142 Type 2 diabetes mellitus with diabetic polyneuropathy: Secondary | ICD-10-CM | POA: Insufficient documentation

## 2023-11-20 DIAGNOSIS — Z5112 Encounter for antineoplastic immunotherapy: Secondary | ICD-10-CM | POA: Diagnosis not present

## 2023-11-20 DIAGNOSIS — D649 Anemia, unspecified: Secondary | ICD-10-CM | POA: Insufficient documentation

## 2023-11-20 DIAGNOSIS — Z7985 Long-term (current) use of injectable non-insulin antidiabetic drugs: Secondary | ICD-10-CM | POA: Diagnosis not present

## 2023-11-20 LAB — COMPREHENSIVE METABOLIC PANEL
ALT: 19 U/L (ref 0–44)
AST: 14 U/L — ABNORMAL LOW (ref 15–41)
Albumin: 4.1 g/dL (ref 3.5–5.0)
Alkaline Phosphatase: 56 U/L (ref 38–126)
Anion gap: 6 (ref 5–15)
BUN: 11 mg/dL (ref 6–20)
CO2: 26 mmol/L (ref 22–32)
Calcium: 9 mg/dL (ref 8.9–10.3)
Chloride: 107 mmol/L (ref 98–111)
Creatinine, Ser: 0.77 mg/dL (ref 0.61–1.24)
GFR, Estimated: >60 mL/min (ref >60)
Glucose, Bld: 266 mg/dL — ABNORMAL HIGH (ref 70–99)
Potassium: 3.7 mmol/L (ref 3.5–5.1)
Sodium: 139 mmol/L (ref 135–145)
Total Bilirubin: 0.6 mg/dL (ref <1.2)
Total Protein: 6.5 g/dL (ref 6.5–8.1)

## 2023-11-20 LAB — CBC WITH DIFFERENTIAL (CANCER CENTER ONLY)
Abs Immature Granulocytes: 0.02 10*3/uL (ref 0.00–0.07)
Basophils Absolute: 0.1 10*3/uL (ref 0.0–0.1)
Basophils Relative: 2 %
Eosinophils Absolute: 0.6 10*3/uL — ABNORMAL HIGH (ref 0.0–0.5)
Eosinophils Relative: 8 %
HCT: 45.7 % (ref 39.0–52.0)
Hemoglobin: 14.9 g/dL (ref 13.0–17.0)
Immature Granulocytes: 0 %
Lymphocytes Relative: 40 %
Lymphs Abs: 2.9 10*3/uL (ref 0.7–4.0)
MCH: 25.8 pg — ABNORMAL LOW (ref 26.0–34.0)
MCHC: 32.6 g/dL (ref 30.0–36.0)
MCV: 79.1 fL — ABNORMAL LOW (ref 80.0–100.0)
Monocytes Absolute: 0.7 10*3/uL (ref 0.1–1.0)
Monocytes Relative: 10 %
Neutro Abs: 2.8 10*3/uL (ref 1.7–7.7)
Neutrophils Relative %: 40 %
Platelet Count: 131 10*3/uL — ABNORMAL LOW (ref 150–400)
RBC: 5.78 MIL/uL (ref 4.22–5.81)
RDW: 18.7 % — ABNORMAL HIGH (ref 11.5–15.5)
WBC Count: 7.1 10*3/uL (ref 4.0–10.5)
nRBC: 0 % (ref 0.0–0.2)

## 2023-11-20 MED ORDER — SODIUM CHLORIDE 0.9 % IV SOLN: Freq: Once | INTRAVENOUS | Status: AC

## 2023-11-20 MED ORDER — MONTELUKAST SODIUM 10 MG PO TABS
10.0000 mg | ORAL_TABLET | Freq: Once | ORAL | Status: AC
  Administered 2023-11-20: 10 mg via ORAL
  Filled 2023-11-20: qty 1

## 2023-11-20 MED ORDER — DIPHENHYDRAMINE HCL 25 MG PO CAPS
50.0000 mg | ORAL_CAPSULE | Freq: Once | ORAL | Status: AC
  Administered 2023-11-20: 50 mg via ORAL
  Filled 2023-11-20: qty 2

## 2023-11-20 MED ORDER — ACETAMINOPHEN 325 MG PO TABS
650.0000 mg | ORAL_TABLET | Freq: Once | ORAL | Status: AC
  Administered 2023-11-20: 650 mg via ORAL
  Filled 2023-11-20: qty 2

## 2023-11-20 MED ORDER — DEXAMETHASONE SODIUM PHOSPHATE 10 MG/ML IJ SOLN
2.0000 mg | Freq: Once | INTRAMUSCULAR | Status: AC
  Administered 2023-11-20: 2 mg via INTRAVENOUS
  Filled 2023-11-20: qty 1

## 2023-11-20 MED ORDER — SODIUM CHLORIDE 0.9 % IV SOLN
16.0000 mg/kg | Freq: Once | INTRAVENOUS | Status: AC
  Administered 2023-11-20: 1300 mg via INTRAVENOUS
  Filled 2023-11-20: qty 5

## 2023-11-20 MED ORDER — FAMOTIDINE IN NACL 20-0.9 MG/50ML-% IV SOLN
20.0000 mg | Freq: Once | INTRAVENOUS | Status: AC
  Administered 2023-11-20: 20 mg via INTRAVENOUS
  Filled 2023-11-20: qty 50

## 2023-11-20 NOTE — Progress Notes (Signed)
Pt given Revlimid rx on 11/20/23. Pt to start taking on 11/23/23 and take for 21 days:11/23/23 - 12/14/23 Pt will take 7 days off from 12/15/23 - 12/21/23. Pt will restart next cycle on 12/23/23. Pt is aware of dates and verbalizes understanding.

## 2023-11-20 NOTE — Progress Notes (Signed)
HEMATOLOGY/ONCOLOGY CLINIC NOTE  Date of Service: 11/20/23  Chief complaint - Follow-up for continued evaluation and management of high risk multiple myeloma  Current treatment-daratumumab/Revlimid/dexamethasone Has not f/u on and does not appear keen to consider Melphalan/HDT-AUTOHSCT  INTERVAL HISTORY:  Donald Suarez is a 57 y.o. male is here for continued evaluation and management of his multiple myeloma. Patient was last seen by me on 09/25/2023 and was doing well overall.  Today, he is scheduled to receive day 1 cycle 15 of treatment and is accompanied by an interpreter. He reports that he has been feeling well overall since his last visit with no new concerns. He has been tolerating treatment well with no new or major toxicities. Patient denies any new bone pain, lightheadedness, dizziness, new fatigue, or abdominal pain. Patient denies any bleeding issues, such as hemmoroids, blood in stools, or black stools.  Patient has been eating well.   He is taking farxiga and metformin for management of elevated blood sugars. He regularly checks his blood sugars at home.   Patient reports that he was recently seen by his dentist and reports no new dental issues otherwise.   He does take vitamin B complex daily.  MEDICAL HISTORY:  Past Medical History:  Diagnosis Date   Abfraction 08/06/2022   Accretions on teeth 08/06/2022   Anemia    Attrition, teeth excessive 08/06/2022   Caries 08/06/2022   Defective dental restoration 08/06/2022   Elevated ferritin 08/07/2022   Hypocalcemia 08/19/2022   In setting of multiple myeloma    Loose, teeth 08/06/2022   Malocclusion 08/06/2022   Microalbuminuria 08/19/2022   Periodontal disease 08/06/2022   Peripheral neuropathy 09/08/2022   Started 08/2022 A/w diabetes and revlimid usage Burning on top of left foot   Teeth missing 08/06/2022    SURGICAL HISTORY: Past Surgical History:  Procedure Laterality Date   APPENDECTOMY     [place] BONE MARROW  BIOPSY & ASPIRATION  02/11/2023   [place] FLUORO GUIDE CV LINE RIGHT  05/15/2022   [place] PATIENT EVAL TECH 0-60 MINS  05/19/2022   [place] US GUIDE VASC ACCESS RIGHT  05/15/2022    SOCIAL HISTORY: Social History   Socioeconomic History   Marital status: Married    Spouse name: Not on file   Number of children: Not on file   Years of education: Not on file   Highest education level: Not on file  Occupational History   Not on file  Tobacco Use   Smoking status: Never   Smokeless tobacco: Never  Vaping Use   Vaping status: Never Used  Substance and Sexual Activity   Alcohol use: Never   Drug use: Never   Sexual activity: Not on file  Other Topics Concern   Not on file  Social History Narrative   Not on file   Social Determinants of Health   Financial Resource Strain: Medium Risk (05/21/2022)   Overall Financial Resource Strain (CARDIA)    Difficulty of Paying Living Expenses: Somewhat hard  Food Insecurity: Food Insecurity Present (05/21/2022)   Hunger Vital Sign    Worried About Running Out of Food in the Last Year: Sometimes true    Ran Out of Food in the Last Year: Sometimes true  Transportation Needs: No Transportation Needs (05/21/2022)   PRAPARE - Administrator, Civil Service (Medical): No    Lack of Transportation (Non-Medical): No  Physical Activity: Not on file  Stress: Not on file  Social Connections: Not on file  Intimate Partner  Violence: Not on file    FAMILY HISTORY: No family history on file.  ALLERGIES:  has No Known Allergies.  MEDICATIONS:  . Allergies as of 11/20/2023   No Known Allergies      Medication List        Accurate as of November 20, 2023  8:29 AM. If you have any questions, ask your nurse or doctor.          Accu-Chek Guide test strip Generic drug: glucose blood Use to test blood sugars up to 4 times daily as directed.   Accu-Chek Guide w/Device Kit Use to test blood sugars up to 4 times daily as directed.   Accu-Chek  Softclix Lancets lancets Use to test blood sugars up to 4 times daily as directed.   acyclovir 400 MG tablet Commonly known as: ZOVIRAX Take 1 tablet (400 mg total) by mouth 2 (two) times daily.   aspirin EC 81 MG tablet Take 1 tablet (81 mg total) by mouth daily. Swallow whole.   azithromycin 250 MG tablet Commonly known as: Zithromax Z-Pak 500mg  po x 1 day then 250mg  po daily for 4 days   B-12 1000 MCG Caps Take 1 tablet by mouth daily.   dapagliflozin propanediol 10 MG Tabs tablet Commonly known as: Farxiga Take 1 tablet (10 mg total) by mouth daily before breakfast.   gabapentin 300 MG capsule Commonly known as: NEURONTIN Take 1 capsule (300 mg total) by mouth 3 (three) times daily.   Klor-Con M20 20 MEQ tablet Generic drug: potassium chloride SA TAKE 1 TABLET BY MOUTH EVERY DAY   lenalidomide 25 MG capsule Commonly known as: REVLIMID TAKE 1 CAPSULE BY MOUTH 1 TIME A DAY FOR 21 DAYS ON THEN 7 DAYS OFF   metFORMIN 500 MG tablet Commonly known as: GLUCOPHAGE Take 2 tablets (1,000 mg total) by mouth 2 (two) times daily with a meal. Note dose increased. Replaces 500 mg twice daily.   ondansetron 8 MG tablet Commonly known as: Zofran Take 1 tablet (8 mg total) by mouth every 8 (eight) hours as needed for nausea or vomiting.   Ozempic (0.25 or 0.5 MG/DOSE) 2 MG/3ML Sopn Generic drug: Semaglutide(0.25 or 0.5MG /DOS) Inject 0.25 mg into the skin once a week.   prochlorperazine 10 MG tablet Commonly known as: COMPAZINE Take 1 tablet (10 mg total) by mouth every 6 (six) hours as needed for nausea or vomiting.         REVIEW OF SYSTEMS:    10 Point review of Systems was done is negative except as noted above.   PHYSICAL EXAMINATION: .There were no vitals taken for this visit.   GENERAL:alert, in no acute distress and comfortable SKIN: no acute rashes, no significant lesions EYES: conjunctiva are pink and non-injected, sclera anicteric OROPHARYNX: MMM, no  exudates, no oropharyngeal erythema or ulceration NECK: supple, no JVD LYMPH:  no palpable lymphadenopathy in the cervical, axillary or inguinal regions LUNGS: clear to auscultation b/l with normal respiratory effort HEART: regular rate & rhythm ABDOMEN:  normoactive bowel sounds , non tender, not distended. Extremity: no pedal edema PSYCH: alert & oriented x 3 with fluent speech NEURO: no focal motor/sensory deficits     LABORATORY DATA:  I have reviewed the data as listed  .    Latest Ref Rng & Units 10/23/2023   11:35 AM 09/25/2023   11:14 AM 09/09/2023    9:34 AM  CBC  WBC 4.0 - 10.5 K/uL 9.2  8.2  8.9   Hemoglobin 13.0 -  17.0 g/dL 16.1  09.6  04.5   Hematocrit 39.0 - 52.0 % 43.5  42.1  41.2   Platelets 150 - 400 K/uL 218  226  280.0     .    Latest Ref Rng & Units 10/23/2023   11:35 AM 09/25/2023   11:14 AM 09/09/2023    9:34 AM  CMP  Glucose 70 - 99 mg/dL 409  811  914   BUN 6 - 20 mg/dL 13  12  9    Creatinine 0.61 - 1.24 mg/dL 7.82  9.56  2.13   Sodium 135 - 145 mmol/L 139  137  136   Potassium 3.5 - 5.1 mmol/L 3.4  3.5  4.1   Chloride 98 - 111 mmol/L 108  105  98   CO2 22 - 32 mmol/L 26  26  29    Calcium 8.9 - 10.3 mg/dL 9.0  9.0  9.8   Total Protein 6.5 - 8.1 g/dL 6.6  6.8  7.4   Total Bilirubin 0.3 - 1.2 mg/dL 0.9  0.5  0.5   Alkaline Phos 38 - 126 U/L 62  67  85   AST 15 - 41 U/L 13  15  18    ALT 0 - 44 U/L 20  21  34    . Lab Results  Component Value Date   LDH 114 10/10/2022    Bone marrow biopsy 02/11/2023   RADIOGRAPHIC STUDIES: I have personally reviewed the radiological images as listed and agreed with the findings in the report. No results found.  ASSESSMENT & PLAN:   57 y.o. 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  Patient diagnosed December 2021 and received 1 cycle of CyBorD. Had previously presented with anemia renal insufficiency  and hyperviscosity symptoms. Bx- 90% plasma cells Initial M spike 8.03g/dl YQM(5H): Not Detected  Dup(1q): Not Detected  Gains(15): DETECTED  Gains(5 and 9): Not Detected  Del(13q)/-13: DETECTED  Del(17p)(TP53): Not Detected  IGH(Rearrangement): SEE BELOW   IgH complex: t(4;14): DETECTED  t(11;14): Not Detected  t(14;16): Not Detected  t(14;20): Not Detected   Cytogenetics: Normal male karyotype.   -Labs from 05/13/2022-beta-2 microglobulin 4.4, LDH 197, lambda free light chain 77.1, kappa, lambda light chain ratio 0.08, multiple myeloma panel pending.  UPEP ordered but not yet collected. 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  Due to multiple myeloma   PLAN  -Discussed lab results on 11/20/23 in detail with patient. CBC fairly stable, showed WBC of 7.1K, hemoglobin of 14.9, and platelets of 131K. -platelet level is borderline low -Hgb and WBC normal -CMP shows elevated blood sugars at 266 -last myeloma panel from one month ago stable M protein 0.2 g/dL -kidney and liver function stable -Continue maintenance monthly 15 mg Revlimid po 3 weeks on 1 week off and Daratumumab for high risk multiple myeloma  -educated patient that his treatment regiment is not curative -discussed that to improve the chance of staying in remission for  longer, there would be the option of a bone marrow transplant, though the procedure would also not be curative -continue to take vitamin B complex -answered all  of patient's questions in detail -continue to follow with PCP to manage blood sugar levels.   Follow-up ***  The total time spent in the appointment was *** minutes* .  All of the patient's questions were answered with apparent satisfaction. The patient knows to call the clinic with any problems, questions or concerns.   Wyvonnia Lora MD MS AAHIVMS [facility] Hematology/Oncology Physician [facility]  .*Total Encounter Time as defined by the Centers for Medicare and Medicaid Services includes, in addition to the face-to-face time of a patient visit (documented in the note above) non-face-to-face time: obtaining and reviewing outside history, ordering and reviewing medications, tests or procedures, care coordination (communications with other health care professionals or caregivers) and documentation in the medical record.    I,Mitra Faeizi,acting as a Neurosurgeon for Wyvonnia Lora, MD.,have documented all relevant documentation on the behalf of Wyvonnia Lora, MD,as directed by  Wyvonnia Lora, MD while in the presence of Wyvonnia Lora, MD.  ***

## 2023-11-20 NOTE — Progress Notes (Signed)
Patient seen by Dr. Kale  Vitals are within treatment parameters.  Labs reviewed: and are within treatment parameters.  Per physician team, patient is ready for treatment and there are NO modifications to the treatment plan.  

## 2023-11-20 NOTE — Patient Instructions (Signed)
[facility] - A DEPT OF [facility]  Discharge Instructions: Thank you for choosing [facility] to provide your oncology and hematology care.   If you have a lab appointment with the Cancer Center, please go directly to the Cancer Center and check in at the registration area.   Wear comfortable clothing and clothing appropriate for easy access to any Portacath or PICC line.   We strive to give you quality time with your provider. You may need to reschedule your appointment if you arrive late (15 or more minutes).  Arriving late affects you and other patients whose appointments are after yours.  Also, if you miss three or more appointments without notifying the office, you may be dismissed from the clinic at the provider's discretion.      For prescription refill requests, have your pharmacy contact our office and allow 72 hours for refills to be completed.    Today you received the following chemotherapy and/or immunotherapy agents: daratumumab      To help prevent nausea and vomiting after your treatment, we encourage you to take your nausea medication as directed.  BELOW ARE SYMPTOMS THAT SHOULD BE REPORTED IMMEDIATELY: *FEVER GREATER THAN 100.4 F (38 C) OR HIGHER *CHILLS OR SWEATING *NAUSEA AND VOMITING THAT IS NOT CONTROLLED WITH YOUR NAUSEA MEDICATION *UNUSUAL SHORTNESS OF BREATH *UNUSUAL BRUISING OR BLEEDING *URINARY PROBLEMS (pain or burning when urinating, or frequent urination) *BOWEL PROBLEMS (unusual diarrhea, constipation, pain near the anus) TENDERNESS IN MOUTH AND THROAT WITH OR WITHOUT PRESENCE OF ULCERS (sore throat, sores in mouth, or a toothache) UNUSUAL RASH, SWELLING OR PAIN  UNUSUAL VAGINAL DISCHARGE OR ITCHING   Items with * indicate a potential emergency and should be followed up as soon as possible or go to the Emergency Department if any problems should occur.  Please show the CHEMOTHERAPY ALERT CARD or  IMMUNOTHERAPY ALERT CARD at check-in to the Emergency Department and triage nurse.  Should you have questions after your visit or need to cancel or reschedule your appointment, please contact [facility] - A DEPT OF [facility]  Dept: [phone]  and follow the prompts.  Office hours are 8:00 a.m. to 4:30 p.m. Monday - Friday. Please note that voicemails left after 4:00 p.m. may not be returned until the following business day.  We are closed weekends and major holidays. You have access to a nurse at all times for urgent questions. Please call the main number to the clinic Dept: [phone] and follow the prompts.   For any non-urgent questions, you may also contact your provider using MyChart. We now offer e-Visits for anyone 30 and older to request care online for non-urgent symptoms. For details visit [email or web address].   Also download the MyChart app! Go to the app store, search "MyChart", open the app, select [place], and log in with your MyChart username and password.

## 2023-11-26 ENCOUNTER — Encounter: Payer: Self-pay | Admitting: Hematology

## 2023-11-29 LAB — MULTIPLE MYELOMA PANEL, SERUM
Albumin SerPl Elph-Mcnc: 3.6 g/dL (ref 2.9–4.4)
Albumin/Glob SerPl: 1.6 (ref 0.7–1.7)
Alpha 1: 0.3 g/dL (ref 0.0–0.4)
Alpha2 Glob SerPl Elph-Mcnc: 0.6 g/dL (ref 0.4–1.0)
B-Globulin SerPl Elph-Mcnc: 0.9 g/dL (ref 0.7–1.3)
Gamma Glob SerPl Elph-Mcnc: 0.6 g/dL (ref 0.4–1.8)
Globulin, Total: 2.4 g/dL (ref 2.2–3.9)
IgA: 150 mg/dL (ref 90–386)
IgG (Immunoglobin G), Serum: 698 mg/dL (ref 603–1613)
IgM (Immunoglobulin M), Srm: 30 mg/dL (ref 20–172)
M Protein SerPl Elph-Mcnc: 0.2 g/dL — ABNORMAL HIGH
Total Protein ELP: 6 g/dL (ref 6.0–8.5)

## 2023-12-08 ENCOUNTER — Other Ambulatory Visit: Payer: Self-pay | Admitting: Hematology

## 2023-12-08 DIAGNOSIS — C9000 Multiple myeloma not having achieved remission: Secondary | ICD-10-CM

## 2023-12-09 ENCOUNTER — Encounter: Payer: Self-pay | Admitting: Internal Medicine

## 2023-12-09 ENCOUNTER — Other Ambulatory Visit: Payer: BC Managed Care – PPO

## 2023-12-09 ENCOUNTER — Other Ambulatory Visit (HOSPITAL_BASED_OUTPATIENT_CLINIC_OR_DEPARTMENT_OTHER): Payer: Self-pay

## 2023-12-09 ENCOUNTER — Ambulatory Visit: Payer: BC Managed Care – PPO | Admitting: Internal Medicine

## 2023-12-09 ENCOUNTER — Telehealth: Payer: Self-pay | Admitting: Internal Medicine

## 2023-12-09 VITALS — BP 152/101 | HR 97 | Temp 98.1°F | Ht 66.0 in | Wt 181.0 lb

## 2023-12-09 DIAGNOSIS — R7989 Other specified abnormal findings of blood chemistry: Secondary | ICD-10-CM

## 2023-12-09 DIAGNOSIS — E1165 Type 2 diabetes mellitus with hyperglycemia: Secondary | ICD-10-CM | POA: Diagnosis not present

## 2023-12-09 DIAGNOSIS — Z7985 Long-term (current) use of injectable non-insulin antidiabetic drugs: Secondary | ICD-10-CM

## 2023-12-09 DIAGNOSIS — E8809 Other disorders of plasma-protein metabolism, not elsewhere classified: Secondary | ICD-10-CM

## 2023-12-09 DIAGNOSIS — Z7984 Long term (current) use of oral hypoglycemic drugs: Secondary | ICD-10-CM | POA: Diagnosis not present

## 2023-12-09 DIAGNOSIS — D759 Disease of blood and blood-forming organs, unspecified: Secondary | ICD-10-CM

## 2023-12-09 DIAGNOSIS — Z603 Acculturation difficulty: Secondary | ICD-10-CM

## 2023-12-09 DIAGNOSIS — R7309 Other abnormal glucose: Secondary | ICD-10-CM

## 2023-12-09 DIAGNOSIS — H538 Other visual disturbances: Secondary | ICD-10-CM | POA: Diagnosis not present

## 2023-12-09 DIAGNOSIS — K045 Chronic apical periodontitis: Secondary | ICD-10-CM

## 2023-12-09 DIAGNOSIS — C9001 Multiple myeloma in remission: Secondary | ICD-10-CM

## 2023-12-09 LAB — MICROALBUMIN / CREATININE URINE RATIO
Creatinine,U: 133.2 mg/dL
Microalb Creat Ratio: 9.1 mg/g (ref 0.0–30.0)
Microalb, Ur: 12.2 mg/dL — ABNORMAL HIGH (ref 0.0–1.9)

## 2023-12-09 LAB — COMPREHENSIVE METABOLIC PANEL
ALT: 26 U/L (ref 0–53)
AST: 32 U/L (ref 0–37)
Albumin: 4.3 g/dL (ref 3.5–5.2)
Alkaline Phosphatase: 61 U/L (ref 39–117)
BUN: 12 mg/dL (ref 6–23)
CO2: 22 meq/L (ref 19–32)
Calcium: 8.4 mg/dL (ref 8.4–10.5)
Chloride: 102 meq/L (ref 96–112)
Creatinine, Ser: 0.75 mg/dL (ref 0.40–1.50)
GFR: 100.57 mL/min (ref >60.00)
Glucose, Bld: 195 mg/dL — ABNORMAL HIGH (ref 70–99)
Potassium: 3.3 meq/L — ABNORMAL LOW (ref 3.5–5.1)
Sodium: 135 meq/L (ref 135–145)
Total Bilirubin: 0.7 mg/dL (ref 0.2–1.2)
Total Protein: 6.9 g/dL (ref 6.0–8.3)

## 2023-12-09 LAB — LIPID PANEL
Cholesterol: 186 mg/dL (ref 0–200)
HDL: 48.6 mg/dL (ref >39.00)
LDL Cholesterol: 120 mg/dL — ABNORMAL HIGH (ref 0–99)
NonHDL: 137.51
Total CHOL/HDL Ratio: 4
Triglycerides: 90 mg/dL (ref 0.0–149.0)
VLDL: 18 mg/dL (ref 0.0–40.0)

## 2023-12-09 LAB — FERRITIN: Ferritin: 211.8 ng/mL (ref 22.0–322.0)

## 2023-12-09 LAB — CBC WITH DIFFERENTIAL/PLATELET
Basophils Absolute: 0.1 10*3/uL (ref 0.0–0.1)
Basophils Relative: 1.1 % (ref 0.0–3.0)
Eosinophils Absolute: 0.2 10*3/uL (ref 0.0–0.7)
Eosinophils Relative: 2.1 % (ref 0.0–5.0)
HCT: 47.9 % (ref 39.0–52.0)
Hemoglobin: 15.6 g/dL (ref 13.0–17.0)
Lymphocytes Relative: 22.5 % (ref 12.0–46.0)
Lymphs Abs: 1.7 10*3/uL (ref 0.7–4.0)
MCHC: 32.5 g/dL (ref 30.0–36.0)
MCV: 81 fL (ref 78.0–100.0)
Monocytes Absolute: 1.1 10*3/uL — ABNORMAL HIGH (ref 0.1–1.0)
Monocytes Relative: 14.2 % — ABNORMAL HIGH (ref 3.0–12.0)
Neutro Abs: 4.6 10*3/uL (ref 1.4–7.7)
Neutrophils Relative %: 60.1 % (ref 43.0–77.0)
Platelets: 149 10*3/uL — ABNORMAL LOW (ref 150.0–400.0)
RBC: 5.92 Mil/uL — ABNORMAL HIGH (ref 4.22–5.81)
RDW: 18.2 % — ABNORMAL HIGH (ref 11.5–15.5)
WBC: 7.7 10*3/uL (ref 4.0–10.5)

## 2023-12-09 MED ORDER — TIRZEPATIDE 2.5 MG/0.5ML ~~LOC~~ SOAJ
2.5000 mg | SUBCUTANEOUS | 3 refills | Status: DC
  Filled 2023-12-09 (×2): qty 2, 28d supply, fill #0

## 2023-12-09 MED ORDER — DAPAGLIFLOZIN PROPANEDIOL 10 MG PO TABS
10.0000 mg | ORAL_TABLET | Freq: Every day | ORAL | 3 refills | Status: DC
  Filled 2023-12-09 (×2): qty 90, 90d supply, fill #0

## 2023-12-09 NOTE — Telephone Encounter (Signed)
Called pharmacy and they confirmed that patient has picked them up.

## 2023-12-09 NOTE — Assessment & Plan Note (Signed)
Elevated Ferritin   His previously elevated ferritin levels normalized with multiple myeloma treatment. No recent checks have been done since August 12, 2022. We will monitor ferritin levels periodically. Lab Results  Component Value Date/Time   FERRITIN 237.4 08/12/2022 08:54 AM   FERRITIN 774 (H) 05/13/2022 11:30 AM   FERRITIN 4,958 (H) 01/01/2021 12:36 PM

## 2023-12-09 NOTE — Telephone Encounter (Signed)
Patient states pharmacy informed him they don't have any new meds on file for pick up. Can the mounjaro and farxiga be re-sent to [facility]?

## 2023-12-09 NOTE — Assessment & Plan Note (Signed)
Blurry Vision   His blurry vision is likely secondary to uncontrolled diabetes mellitus. He has been unable to see an ophthalmologist due to language barriers and scheduling issues. We will refer him to ophthalmology and address the uncontrolled diabetes to improve his vision.

## 2023-12-09 NOTE — Assessment & Plan Note (Signed)
Multiple Myeloma   He is currently on Revlimid, which is maintaining protein and ferritin levels. We will continue Revlimid.

## 2023-12-09 NOTE — Patient Instructions (Addendum)
It was a pleasure seeing you today! Your health and satisfaction are our top priorities.  Donald Hew, MD  Your Providers PCP: Lula Olszewski, MD,  [phone]) Referring Provider: Lula Olszewski, MD,  [phone]) Care Team Provider: Johney Maine, MD,  [phone])  VISIT SUMMARY:  You came in today for your three-month follow-up. We discussed your diabetes, blurry vision, multiple myeloma, and general health maintenance. We have made some adjustments to your medications and have planned follow-ups to better manage your conditions.  YOUR PLAN:  -UNCONTROLLED DIABETES MELLITUS: Your blood sugar levels have been high, reaching up to 266 mg/dL. This can lead to complications like eye and kidney problems. We will increase your semaglutide (Ozempic) to 0.5 mg weekly, continue metformin 500 mg, and start you on Farxiga. We will send your prescriptions to [facility] and order blood work today. Please follow up in one month to check your blood sugar levels.  -BLURRY VISION: Your blurry vision is likely due to your high blood sugar levels. We will refer you to an eye specialist and work on controlling your diabetes to help improve your vision.  -MULTIPLE MYELOMA: You are currently taking Revlimid, which is helping to manage your protein and ferritin levels. We will continue this medication.  -ELEVATED FERRITIN: Your ferritin levels, which were previously high, have normalized with your multiple myeloma treatment. We will keep monitoring these levels periodically.  -GENERAL HEALTH MAINTENANCE: You have a dental appointment scheduled for next year. Please make sure to attend this appointment to maintain your dental health.  INSTRUCTIONS:  Please follow up in one month to assess your blood sugar levels and overall health. Make sure to get your blood work done today. We will also refer you to an eye specialist to address your blurry vision.   NEXT STEPS: [x]  Early  Intervention: Schedule sooner appointment, call our on-call services, or go to emergency room if there is any significant Increase in pain or discomfort New or worsening symptoms Sudden or severe changes in your health [x]  Flexible Follow-Up: We recommend a No follow-ups on file. for optimal routine care. This allows for progress monitoring and treatment adjustments. [x]  Preventive Care: Schedule your annual preventive care visit! It's typically covered by insurance and helps identify potential health issues early. [x]  Lab & X-ray Appointments: Incomplete tests scheduled today, or call to schedule. X-rays: [facility] (M-F, 8:30am-noon or 1pm-5pm). [x]  Medical Information Release: Sign a release form at front desk to obtain relevant medical information we don't have.  MAKING THE MOST OF OUR FOCUSED 20 MINUTE APPOINTMENTS: [x]   Clearly state your top concerns at the beginning of the visit to focus our discussion [x]   If you anticipate you will need more time, please inform the front desk during scheduling - we can book multiple appointments in the same week. [x]   If you have transportation problems- use our convenient video appointments or ask about transportation support. [x]   We can get down to business faster if you use MyChart to update information before the visit and submit non-urgent questions before your visit. Thank you for taking the time to provide details through MyChart.  Let our nurse know and she can import this information into your encounter documents.  Arrival and Wait Times: [x]   Arriving on time ensures that everyone receives prompt attention. [x]   Early morning (8a) and afternoon (1p) appointments tend to have shortest wait times. [x]   Unfortunately, we cannot delay appointments for late arrivals or hold slots  during phone calls.  Getting Answers and Following Up [x]   Simple Questions & Concerns: For quick questions or basic follow-up after your visit, reach  [name] at [phone] or MyChart messaging. [x]   Complex Concerns: If your concern is more complex, scheduling an appointment might be best. Discuss this with the staff to find the most suitable option. [x]   Lab & Imaging Results: We'll contact you directly if results are abnormal or you don't use MyChart. Most normal results will be on MyChart within 2-3 business days, with a review message from Dr. Jon Billings. Haven't heard back in 2 weeks? Need results sooner? Contact us at [phone]. [x]   Referrals: Our referral coordinator will manage specialist referrals. The specialist's office should contact you within 2 weeks to schedule an appointment. Call us if you haven't heard from them after 2 weeks.  Staying Connected [x]   MyChart: Activate your MyChart for the fastest way to access results and message [name]. See the last page of this paperwork for instructions on how to activate.  Bring to Your Next Appointment [x]   Medications: Please bring all your medication bottles to your next appointment to ensure we have an accurate record of your prescriptions. [x]   Health Diaries: If you're monitoring any health conditions at home, keeping a diary of your readings can be very helpful for discussions at your next appointment.  Billing [x]   X-ray & Lab Orders: These are billed by separate companies. Contact the invoicing company directly for questions or concerns. [x]   Visit Charges: Discuss any billing inquiries with our administrative services team.  Your Satisfaction Matters [x]   Share Your Experience: We strive for your satisfaction! If you have any complaints, or preferably compliments, please let Dr. Jon Billings know directly or contact our Practice Administrators, Edwena Felty or [employer], by asking at the front desk.   Reviewing Your Records [x]   Review this early draft of your clinical encounter notes below and the final encounter summary tomorrow on MyChart after its been completed.  All  orders placed so far are visible here: Type 2 diabetes mellitus with hyperglycemia, without long-term current use of insulin (HCC) -     Ambulatory referral to Ophthalmology -     CBC with Differential/Platelet -     Comprehensive metabolic panel -     Lipid panel -     Hemoglobin A1c -     Microalbumin / creatinine urine ratio -     Tirzepatide; Inject 2.5 mg into the skin once a week.  Dispense: 2 mL; Refill: 3 -     Dapagliflozin Propanediol; Take 1 tablet (10 mg total) by mouth daily before breakfast.  Dispense: 90 tablet; Refill: 3  Blurry vision -     Ambulatory referral to Ophthalmology -     CBC with Differential/Platelet -     Comprehensive metabolic panel -     Lipid panel -     Hemoglobin A1c -     Microalbumin / creatinine urine ratio  Chronic apical periodontitis -     CBC with Differential/Platelet -     Comprehensive metabolic panel -     Lipid panel -     Hemoglobin A1c -     Microalbumin / creatinine urine ratio  Elevated ferritin -     Ferritin -     CBC with Differential/Platelet -     Comprehensive metabolic panel -     Lipid panel -     Hemoglobin A1c -  Microalbumin / creatinine urine ratio  Hyperproteinemia -     CBC with Differential/Platelet -     Comprehensive metabolic panel -     Lipid panel -     Hemoglobin A1c -     Microalbumin / creatinine urine ratio  Hyperviscosity -     CBC with Differential/Platelet -     Comprehensive metabolic panel -     Lipid panel -     Hemoglobin A1c -     Microalbumin / creatinine urine ratio  Hypoalbuminemia -     CBC with Differential/Platelet -     Comprehensive metabolic panel -     Lipid panel -     Hemoglobin A1c -     Microalbumin / creatinine urine ratio  Long-term current use of injectable noninsulin antidiabetic medication -     CBC with Differential/Platelet -     Comprehensive metabolic panel -     Lipid panel -     Hemoglobin A1c -     Microalbumin / creatinine urine ratio  Long  term current use of oral hypoglycemic drug -     CBC with Differential/Platelet -     Comprehensive metabolic panel -     Lipid panel -     Hemoglobin A1c -     Microalbumin / creatinine urine ratio  Language barrier affecting health care  Multiple myeloma in remission (HCC)

## 2023-12-09 NOTE — Progress Notes (Signed)
[facility]: [phone]   -- Medical Office Visit --  Patient:  Donald Suarez      Age: 57 y.o.       Sex:  male  Date:   12/09/2023 Today's Healthcare Provider: Lula Olszewski, MD  ==========================================================================    Assessment & Plan Type 2 diabetes mellitus with hyperglycemia, without long-term current use of insulin (HCC) Uncontrolled Diabetes Mellitus  - worsening due to language barrier causing medical and insurance navigation challenges. His diabetes is worsening, with recent blood glucose levels reaching 266 mg/dL. He is currently on metformin but non-adherent to semaglutide (Ozempic) and has been unable to obtain [place] due to pharmacy and insurance issues. We discussed the importance of glycemic control to prevent complications such as retinopathy and nephropathy and explained how semaglutide and Marcelline Deist can help lower blood glucose and protect renal function. We will increase semaglutide to 0.5 mg weekly, continue metformin 500 mg, start Farxiga, send prescriptions to our community pharmacy, order blood work today, and follow up in one month to assess glycemic control.  Lab Results  Component Value Date   HGBA1C 8.9 (H) 09/09/2023   HGBA1C 8.1 (H) 06/09/2023   HGBA1C 8.2 (A) 03/09/2023     Blurry vision Blurry Vision   His blurry vision is likely secondary to uncontrolled diabetes mellitus. He has been unable to see an ophthalmologist due to language barriers and scheduling issues. We will refer him to ophthalmology and address the uncontrolled diabetes to improve his vision. Chronic apical periodontitis  Elevated ferritin Elevated Ferritin   His previously elevated ferritin levels normalized with multiple myeloma treatment. No recent checks have been done since August 12, 2022. We will monitor ferritin levels periodically. Lab Results  Component Value Date/Time   FERRITIN 237.4 08/12/2022 08:54  AM   FERRITIN 774 (H) 05/13/2022 11:30 AM   FERRITIN 4,958 (H) 01/01/2021 12:36 PM    Hyperproteinemia Multiple Myeloma   He is currently on Revlimid, which is maintaining protein and ferritin levels. We will continue Revlimid. Hyperviscosity Multiple Myeloma   He is currently on Revlimid, which is maintaining protein and ferritin levels. We will continue Revlimid. Hypoalbuminemia  Long-term current use of injectable noninsulin antidiabetic medication  Long term current use of oral hypoglycemic drug  Language barrier affecting health care  Multiple myeloma in remission [facility] Multiple Myeloma   He is currently on Revlimid, which is maintaining protein and ferritin levels.  I reviewed oncologist most recent plan (indented below) to coordinate care: -Discussed lab results on 11/20/23 in detail with patient. CBC fairly stable, showed WBC of 7.1K, hemoglobin of 14.9, and platelets of 131K. -platelet level is borderline low -Hgb and WBC normal -CMP shows elevated blood sugars at 266 -last myeloma panel from one month ago stable M protein 0.2 g/dL -kidney and liver function stable -Continue maintenance monthly 15 mg Revlimid po 3 weeks on 1 week off and Daratumumab for high risk multiple myeloma  -educated patient that his treatment regiment is not curative -discussed that to improve the chance of staying in remission for longer, there would be the option of a bone marrow transplant, though the procedure would also not be curative -continue to take vitamin B complex -answered all of patient's questions in detail -continue to follow with PCP to manage blood sugar levels.      Orders Placed During this Encounter:   ED Discharge Orders          Ordered    Ambulatory referral  to Ophthalmology       Comments: Call his interpreter [phone] for scheduling ([place])   12/09/23 0848    Ferritin        12/09/23 0848    CBC with Differential/Platelet        12/09/23 0848     Comprehensive metabolic panel       Comments: [place]    12/09/23 0848    Lipid panel       Comments: [place]    12/09/23 0848    Hemoglobin A1c       Comments: [place]    12/09/23 0848    Microalbumin / creatinine urine ratio        12/09/23 0848    tirzepatide (MOUNJARO) 2.5 MG/0.5ML Pen  Weekly        12/09/23 0848    dapagliflozin propanediol (FARXIGA) 10 MG TABS tablet  Daily before breakfast        12/09/23 0848           General Health Maintenance   He has a dental appointment scheduled for next year. We will ensure he follows up with dental care.  Follow-up   We will follow up in one month due to worsening sugar control and challenges with language barrier.  Hopefully he will be finishing up Mounjaro 2.5 mg x 4 weeks.  Medical Decision Making: 2 or more stable chronic illnesses     Ordering of each unique test; Prescription drug management  Diagnosis or treatment significantly limited by social determinants of health    SUBJECTIVE: 57 y.o. male who has Multiple myeloma in remission (HCC); Counseling regarding advance care planning and goals of care; Hypoalbuminemia; Hyperviscosity; Type 2 diabetes mellitus with hyperglycemia (HCC); Chronic apical periodontitis; Hyperproteinemia; Blurry vision; Hypomagnesemia; Elevated ferritin; Microalbuminuria; Hypocalcemia; Peripheral neuropathy; Language barrier affecting health care; Hypokalemia; and History of dental problems on their problem list.  Main reasons for visit/main concerns/chief complaint: 3 month follow-up    AI-Extracted: Discussed the use of AI scribe software for clinical note transcription with the patient, who gave verbal consent to proceed.  History of Present Illness The patient, with a history of myeloma, elevated ferritin, and diabetes, presents for a three-month follow-up. He reports experiencing blurry vision, which he attributes to his worsening blood sugar levels. He notes that his blood sugar has  been trending upwards, reaching 200-266 recently. He is currently on metformin for diabetes management.  The patient also has a history of dental issues and is scheduled for a dental appointment next year. He has been referred to ophthalmology due to his vision problems, but there have been barriers in scheduling due to language difficulties.  The patient is also on Revlimid for myeloma, which has helped to manage his protein and ferritin levels. He is also taking potassium, B12, and gabapentin for nerve pain, which he reports as being helpful. He has been prescribed semaglutide (Ozempic) for his diabetes, but it appears he has not been taking it. He has also been prescribed Marcelline Deist for his diabetes, but it is unclear if he has been taking this medication.   Note that patient  has a past medical history of Abfraction (08/06/2022), Accretions on teeth (08/06/2022), Anemia, Attrition, teeth excessive (08/06/2022), Caries (08/06/2022), Defective dental restoration (08/06/2022), Elevated ferritin (08/07/2022), Hypocalcemia (08/19/2022), Loose, teeth (08/06/2022), Malocclusion (08/06/2022), Microalbuminuria (08/19/2022), Periodontal disease (08/06/2022), Peripheral neuropathy (09/08/2022), and Teeth missing (08/06/2022).  Problem list overviews that were updated at today's visit: Problem  Elevated Ferritin   Lab Results  Component  Value Date/Time   FERRITIN 237.4 08/12/2022 08:54 AM   FERRITIN 774 (H) 05/13/2022 11:30 AM   FERRITIN 4,958 (H) 01/01/2021 12:36 PM       Chronic Apical Periodontitis   Dental appointment planned   Hyperviscosity   Due to myeloma   Hypoalbuminemia   No results found for: "LABPROT" No results found for: "LABPROT"    Hyperproteinemia   Managed by oncology On revlimid   Blurry Vision   yet to see ophthalmology due to language barrier Blurry vision (ICD-10: H53.8) Potentially related to poor glycemic control Plan: Refer to ophthalmology for diabetic  retinopathy screening.     Med reconciliation: Current Outpatient Medications on File Prior to Visit  Medication Sig   Accu-Chek Softclix Lancets lancets Use to test blood sugars up to 4 times daily as directed.   acyclovir (ZOVIRAX) 400 MG tablet Take 1 tablet (400 mg total) by mouth 2 (two) times daily.   aspirin EC 81 MG tablet Take 1 tablet (81 mg total) by mouth daily. Swallow whole.   Blood Glucose Monitoring Suppl (ACCU-CHEK GUIDE) w/Device KIT Use to test blood sugars up to 4 times daily as directed.   Cyanocobalamin (B-12) 1000 MCG CAPS Take 1 tablet by mouth daily.   gabapentin (NEURONTIN) 300 MG capsule Take 1 capsule (300 mg total) by mouth 3 (three) times daily.   glucose blood (ACCU-CHEK GUIDE) test strip Use to test blood sugars up to 4 times daily as directed.   KLOR-CON M20 20 MEQ tablet TAKE 1 TABLET BY MOUTH EVERY DAY   lenalidomide (REVLIMID) 25 MG capsule TAKE 1 CAPSULE BY MOUTH 1 TIME A DAY FOR 21 DAYS ON THEN 7 DAYS OFF   metFORMIN (GLUCOPHAGE) 500 MG tablet Take 2 tablets (1,000 mg total) by mouth 2 (two) times daily with a meal. Note dose increased. Replaces 500 mg twice daily.   ondansetron (ZOFRAN) 8 MG tablet Take 1 tablet (8 mg total) by mouth every 8 (eight) hours as needed for nausea or vomiting.   prochlorperazine (COMPAZINE) 10 MG tablet Take 1 tablet (10 mg total) by mouth every 6 (six) hours as needed for nausea or vomiting.   Semaglutide,0.25 or 0.5MG /DOS, (OZEMPIC, 0.25 OR 0.5 MG/DOSE,) 2 MG/3ML SOPN Inject 0.25 mg into the skin once a week.   Current Facility-Administered Medications on File Prior to Visit  Medication   clotrimazole (LOTRIMIN) 1 % cream   Medications Discontinued During This Encounter  Medication Reason   azithromycin (ZITHROMAX Z-PAK) 250 MG tablet Completed Course   dapagliflozin propanediol (FARXIGA) 10 MG TABS tablet Reorder     Objective   Physical Exam     12/09/2023    8:28 AM 12/09/2023    8:19 AM 11/20/2023    2:58  PM  Vitals with BMI  Height  5\' 6"    Weight  181 lbs   BMI  29.23   Systolic 152 153 604  Diastolic 101 90 74  Pulse  97 74   Wt Readings from Last 10 Encounters:  12/09/23 181 lb (82.1 kg)  11/20/23 179 lb 12.8 oz (81.6 kg)  10/23/23 177 lb 8 oz (80.5 kg)  09/25/23 171 lb 3.2 oz (77.7 kg)  09/09/23 168 lb 9.6 oz (76.5 kg)  08/28/23 170 lb 3.2 oz (77.2 kg)  07/31/23 177 lb 6.4 oz (80.5 kg)  07/03/23 178 lb 4 oz (80.9 kg)  06/09/23 172 lb (78 kg)  06/05/23 176 lb 4.8 oz (80 kg)   Vital signs reviewed.  Nursing  notes reviewed. Weight trend reviewed. Abnormalities and Problem-Specific physical exam findings:  poor english, interpreter explains almost everything.  General Appearance:  No acute distress appreciable.   Well-groomed, healthy-appearing male.  Well proportioned with no abnormal fat distribution.  Good muscle tone. Pulmonary:  Normal work of breathing at rest, no respiratory distress apparent. SpO2: 100 %  Musculoskeletal: All extremities are intact.  Neurological:  Awake, alert, oriented, and engaged.  No obvious focal neurological deficits or cognitive impairments.  Sensorium seems unclouded.   Speech is clear and coherent with logical content. Psychiatric:  Appropriate mood, pleasant and cooperative demeanor, thoughtful and engaged during the exam  Results            No results found for any visits on 12/09/23.  Appointment on 11/20/2023  Component Date Value   WBC Count 11/20/2023 7.1    RBC 11/20/2023 5.78    Hemoglobin 11/20/2023 14.9    HCT 11/20/2023 45.7    MCV 11/20/2023 79.1 (L)    MCH 11/20/2023 25.8 (L)    MCHC 11/20/2023 32.6    RDW 11/20/2023 18.7 (H)    Platelet Count 11/20/2023 131 (L)    nRBC 11/20/2023 0.0    Neutrophils Relative % 11/20/2023 40    Neutro Abs 11/20/2023 2.8    Lymphocytes Relative 11/20/2023 40    Lymphs Abs 11/20/2023 2.9    Monocytes Relative 11/20/2023 10    Monocytes Absolute 11/20/2023 0.7    Eosinophils Relative  11/20/2023 8    Eosinophils Absolute 11/20/2023 0.6 (H)    Basophils Relative 11/20/2023 2    Basophils Absolute 11/20/2023 0.1    Immature Granulocytes 11/20/2023 0    Abs Immature Granulocytes 11/20/2023 0.02    Sodium 11/20/2023 139    Potassium 11/20/2023 3.7    Chloride 11/20/2023 107    CO2 11/20/2023 26    Glucose, Bld 11/20/2023 266 (H)    BUN 11/20/2023 11    Creatinine, Ser 11/20/2023 0.77    Calcium 11/20/2023 9.0    Total Protein 11/20/2023 6.5    Albumin 11/20/2023 4.1    AST 11/20/2023 14 (L)    ALT 11/20/2023 19    Alkaline Phosphatase 11/20/2023 56    Total Bilirubin 11/20/2023 0.6    GFR, Estimated 11/20/2023 >60    Anion gap 11/20/2023 6    IgG (Immunoglobin G), Se* 11/20/2023 698    IgA 11/20/2023 150    IgM (Immunoglobulin M), * 11/20/2023 30    Total Protein ELP 11/20/2023 6.0    Albumin SerPl Elph-Mcnc 11/20/2023 3.6    Alpha 1 11/20/2023 0.3    Alpha2 Glob SerPl Elph-M* 11/20/2023 0.6    B-Globulin SerPl Elph-Mc* 11/20/2023 0.9    Gamma Glob SerPl Elph-Mc* 11/20/2023 0.6    M Protein SerPl Elph-Mcnc 11/20/2023 0.2 (H)    Globulin, Total 11/20/2023 2.4    Albumin/Glob SerPl 11/20/2023 1.6    IFE 1 11/20/2023 Comment (A)    Please Note 11/20/2023 Comment   Appointment on 10/23/2023  Component Date Value   WBC Count 10/23/2023 9.2    RBC 10/23/2023 5.64    Hemoglobin 10/23/2023 14.3    HCT 10/23/2023 43.5    MCV 10/23/2023 77.1 (L)    MCH 10/23/2023 25.4 (L)    MCHC 10/23/2023 32.9    RDW 10/23/2023 19.2 (H)    Platelet Count 10/23/2023 218    nRBC 10/23/2023 0.0    Neutrophils Relative % 10/23/2023 49    Neutro Abs 10/23/2023 4.4    Lymphocytes  Relative 10/23/2023 33    Lymphs Abs 10/23/2023 3.1    Monocytes Relative 10/23/2023 9    Monocytes Absolute 10/23/2023 0.9    Eosinophils Relative 10/23/2023 7    Eosinophils Absolute 10/23/2023 0.7 (H)    Basophils Relative 10/23/2023 2    Basophils Absolute 10/23/2023 0.1    Immature  Granulocytes 10/23/2023 0    Abs Immature Granulocytes 10/23/2023 0.03    Sodium 10/23/2023 139    Potassium 10/23/2023 3.4 (L)    Chloride 10/23/2023 108    CO2 10/23/2023 26    Glucose, Bld 10/23/2023 230 (H)    BUN 10/23/2023 13    Creatinine, Ser 10/23/2023 0.76    Calcium 10/23/2023 9.0    Total Protein 10/23/2023 6.6    Albumin 10/23/2023 4.1    AST 10/23/2023 13 (L)    ALT 10/23/2023 20    Alkaline Phosphatase 10/23/2023 62    Total Bilirubin 10/23/2023 0.9    GFR, Estimated 10/23/2023 >60    Anion gap 10/23/2023 5    IgG (Immunoglobin G), Se* 10/23/2023 689    IgA 10/23/2023 145    IgM (Immunoglobulin M), * 10/23/2023 26    Total Protein ELP 10/23/2023 6.1    Albumin SerPl Elph-Mcnc 10/23/2023 3.6    Alpha 1 10/23/2023 0.3    Alpha2 Glob SerPl Elph-M* 10/23/2023 0.7    B-Globulin SerPl Elph-Mc* 10/23/2023 0.9    Gamma Glob SerPl Elph-Mc* 10/23/2023 0.6    M Protein SerPl Elph-Mcnc 10/23/2023 0.2 (H)    Globulin, Total 10/23/2023 2.5    Albumin/Glob SerPl 10/23/2023 1.5    IFE 1 10/23/2023 Comment (A)    Please Note 10/23/2023 Comment   Appointment on 09/25/2023  Component Date Value   WBC Count 09/25/2023 8.2    RBC 09/25/2023 5.53    Hemoglobin 09/25/2023 13.6    HCT 09/25/2023 42.1    MCV 09/25/2023 76.1 (L)    MCH 09/25/2023 24.6 (L)    MCHC 09/25/2023 32.3    RDW 09/25/2023 17.6 (H)    Platelet Count 09/25/2023 226    nRBC 09/25/2023 0.0    Neutrophils Relative % 09/25/2023 45    Neutro Abs 09/25/2023 3.7    Lymphocytes Relative 09/25/2023 40    Lymphs Abs 09/25/2023 3.3    Monocytes Relative 09/25/2023 11    Monocytes Absolute 09/25/2023 0.9    Eosinophils Relative 09/25/2023 3    Eosinophils Absolute 09/25/2023 0.2    Basophils Relative 09/25/2023 1    Basophils Absolute 09/25/2023 0.1    Immature Granulocytes 09/25/2023 0    Abs Immature Granulocytes 09/25/2023 0.02    Sodium 09/25/2023 137    Potassium 09/25/2023 3.5    Chloride 09/25/2023 105     CO2 09/25/2023 26    Glucose, Bld 09/25/2023 159 (H)    BUN 09/25/2023 12    Creatinine, Ser 09/25/2023 0.78    Calcium 09/25/2023 9.0    Total Protein 09/25/2023 6.8    Albumin 09/25/2023 3.9    AST 09/25/2023 15    ALT 09/25/2023 21    Alkaline Phosphatase 09/25/2023 67    Total Bilirubin 09/25/2023 0.5    GFR, Estimated 09/25/2023 >60    Anion gap 09/25/2023 6    IgG (Immunoglobin G), Se* 09/25/2023 659    IgA 09/25/2023 160    IgM (Immunoglobulin M), * 09/25/2023 30    Total Protein ELP 09/25/2023 6.3    Albumin SerPl Elph-Mcnc 09/25/2023 3.5    Alpha 1 09/25/2023 0.1  Alpha2 Glob SerPl Elph-M* 09/25/2023 1.2 (H)    B-Globulin SerPl Elph-Mc* 09/25/2023 1.0    Gamma Glob SerPl Elph-Mc* 09/25/2023 0.5    M Protein SerPl Elph-Mcnc 09/25/2023 0.2 (H)    Globulin, Total 09/25/2023 2.8    Albumin/Glob SerPl 09/25/2023 1.3    IFE 1 09/25/2023 Comment (A)    Please Note 09/25/2023 Comment   Lab on 09/09/2023  Component Date Value   Hgb A1c MFr Bld 09/09/2023 8.9 (H)    Mean Plasma Glucose 09/09/2023 209    eAG (mmol/L) 09/09/2023 11.6   Office Visit on 09/09/2023  Component Date Value   WBC 09/09/2023 8.9    RBC 09/09/2023 5.28    Hemoglobin 09/09/2023 13.0    HCT 09/09/2023 41.2    MCV 09/09/2023 78.0    MCHC 09/09/2023 31.5    RDW 09/09/2023 16.8 (H)    Platelets 09/09/2023 280.0    Neutrophils Relative % 09/09/2023 52.5    Lymphocytes Relative 09/09/2023 38.1    Monocytes Relative 09/09/2023 7.1    Eosinophils Relative 09/09/2023 1.5    Basophils Relative 09/09/2023 0.8    Neutro Abs 09/09/2023 4.7    Lymphs Abs 09/09/2023 3.4    Monocytes Absolute 09/09/2023 0.6    Eosinophils Absolute 09/09/2023 0.1    Basophils Absolute 09/09/2023 0.1    Sodium 09/09/2023 136    Potassium 09/09/2023 4.1    Chloride 09/09/2023 98    CO2 09/09/2023 29    Glucose, Bld 09/09/2023 155 (H)    BUN 09/09/2023 9    Creatinine, Ser 09/09/2023 0.71    Total Bilirubin 09/09/2023  0.5    Alkaline Phosphatase 09/09/2023 85    AST 09/09/2023 18    ALT 09/09/2023 34    Total Protein 09/09/2023 7.4    Albumin 09/09/2023 3.9    GFR 09/09/2023 102.43    Calcium 09/09/2023 9.8    Cholesterol 09/09/2023 163    Triglycerides 09/09/2023 88.0    HDL 09/09/2023 33.70 (L)    VLDL 09/09/2023 17.6    LDL Cholesterol 09/09/2023 112 (H)    Total CHOL/HDL Ratio 09/09/2023 5    NonHDL 09/09/2023 129.77    Microalb, Ur 09/09/2023 1.2    Creatinine,U 09/09/2023 118.8    Microalb Creat Ratio 09/09/2023 1.0   Appointment on 08/28/2023  Component Date Value   WBC Count 08/28/2023 9.9    RBC 08/28/2023 5.17    Hemoglobin 08/28/2023 13.1    HCT 08/28/2023 39.4    MCV 08/28/2023 76.2 (L)    MCH 08/28/2023 25.3 (L)    MCHC 08/28/2023 33.2    RDW 08/28/2023 15.1    Platelet Count 08/28/2023 252    nRBC 08/28/2023 0.0    Neutrophils Relative % 08/28/2023 58    Neutro Abs 08/28/2023 5.9    Lymphocytes Relative 08/28/2023 25    Lymphs Abs 08/28/2023 2.5    Monocytes Relative 08/28/2023 13    Monocytes Absolute 08/28/2023 1.2 (H)    Eosinophils Relative 08/28/2023 1    Eosinophils Absolute 08/28/2023 0.1    Basophils Relative 08/28/2023 2    Basophils Absolute 08/28/2023 0.2 (H)    Immature Granulocytes 08/28/2023 1    Abs Immature Granulocytes 08/28/2023 0.05    Sodium 08/28/2023 135    Potassium 08/28/2023 3.5    Chloride 08/28/2023 100    CO2 08/28/2023 25    Glucose, Bld 08/28/2023 317 (H)    BUN 08/28/2023 10    Creatinine, Ser 08/28/2023 0.71    Calcium 08/28/2023  8.8 (L)    Total Protein 08/28/2023 7.2    Albumin 08/28/2023 3.7    AST 08/28/2023 14 (L)    ALT 08/28/2023 40    Alkaline Phosphatase 08/28/2023 111    Total Bilirubin 08/28/2023 0.6    GFR, Estimated 08/28/2023 >60    Anion gap 08/28/2023 10    IgG (Immunoglobin G), Se* 08/28/2023 675    IgA 08/28/2023 204    IgM (Immunoglobulin M), * 08/28/2023 31    Total Protein ELP 08/28/2023 6.4    Albumin  SerPl Elph-Mcnc 08/28/2023 3.0    Alpha 1 08/28/2023 0.2    Alpha2 Glob SerPl Elph-M* 08/28/2023 1.6 (H)    B-Globulin SerPl Elph-Mc* 08/28/2023 1.1    Gamma Glob SerPl Elph-Mc* 08/28/2023 0.5    M Protein SerPl Elph-Mcnc 08/28/2023 0.2 (H)    Globulin, Total 08/28/2023 3.4    Albumin/Glob SerPl 08/28/2023 0.9    IFE 1 08/28/2023 Comment (A)    Please Note 08/28/2023 Comment   Appointment on 07/31/2023  Component Date Value   IgG (Immunoglobin G), Se* 07/31/2023 699    IgA 07/31/2023 153    IgM (Immunoglobulin M), * 07/31/2023 26    Total Protein ELP 07/31/2023 6.3    Albumin SerPl Elph-Mcnc 07/31/2023 3.2    Alpha 1 07/31/2023 0.2    Alpha2 Glob SerPl Elph-M* 07/31/2023 1.2 (H)    B-Globulin SerPl Elph-Mc* 07/31/2023 1.0    Gamma Glob SerPl Elph-Mc* 07/31/2023 0.6    M Protein SerPl Elph-Mcnc 07/31/2023 0.2 (H)    Globulin, Total 07/31/2023 3.1    Albumin/Glob SerPl 07/31/2023 1.1    IFE 1 07/31/2023 Comment (A)    Please Note 07/31/2023 Comment    Sodium 07/31/2023 137    Potassium 07/31/2023 3.5    Chloride 07/31/2023 104    CO2 07/31/2023 25    Glucose, Bld 07/31/2023 193 (H)    BUN 07/31/2023 14    Creatinine, Ser 07/31/2023 0.65    Calcium 07/31/2023 8.4 (L)    Total Protein 07/31/2023 6.6    Albumin 07/31/2023 3.8    AST 07/31/2023 12 (L)    ALT 07/31/2023 24    Alkaline Phosphatase 07/31/2023 72    Total Bilirubin 07/31/2023 0.6    GFR, Estimated 07/31/2023 >60    Anion gap 07/31/2023 8   Appointment on 07/31/2023  Component Date Value   WBC Count 07/31/2023 9.4    RBC 07/31/2023 5.33    Hemoglobin 07/31/2023 13.5    HCT 07/31/2023 40.9    MCV 07/31/2023 76.7 (L)    MCH 07/31/2023 25.3 (L)    MCHC 07/31/2023 33.0    RDW 07/31/2023 15.1    Platelet Count 07/31/2023 239    nRBC 07/31/2023 0.0    Neutrophils Relative % 07/31/2023 55    Neutro Abs 07/31/2023 5.1    Lymphocytes Relative 07/31/2023 29    Lymphs Abs 07/31/2023 2.7    Monocytes Relative  07/31/2023 13    Monocytes Absolute 07/31/2023 1.2 (H)    Eosinophils Relative 07/31/2023 2    Eosinophils Absolute 07/31/2023 0.2    Basophils Relative 07/31/2023 1    Basophils Absolute 07/31/2023 0.1    Immature Granulocytes 07/31/2023 0    Abs Immature Granulocytes 07/31/2023 0.03   Appointment on 07/03/2023  Component Date Value   WBC Count 07/03/2023 8.2    RBC 07/03/2023 5.41    Hemoglobin 07/03/2023 14.0    HCT 07/03/2023 42.6    MCV 07/03/2023 78.7 (L)  MCH 07/03/2023 25.9 (L)    MCHC 07/03/2023 32.9    RDW 07/03/2023 15.9 (H)    Platelet Count 07/03/2023 181    nRBC 07/03/2023 0.0    Neutrophils Relative % 07/03/2023 56    Neutro Abs 07/03/2023 4.6    Lymphocytes Relative 07/03/2023 31    Lymphs Abs 07/03/2023 2.5    Monocytes Relative 07/03/2023 10    Monocytes Absolute 07/03/2023 0.8    Eosinophils Relative 07/03/2023 2    Eosinophils Absolute 07/03/2023 0.2    Basophils Relative 07/03/2023 1    Basophils Absolute 07/03/2023 0.1    Immature Granulocytes 07/03/2023 0    Abs Immature Granulocytes 07/03/2023 0.02    Sodium 07/03/2023 139    Potassium 07/03/2023 3.4 (L)    Chloride 07/03/2023 103    CO2 07/03/2023 27    Glucose, Bld 07/03/2023 166 (H)    BUN 07/03/2023 13    Creatinine, Ser 07/03/2023 0.86    Calcium 07/03/2023 9.3    Total Protein 07/03/2023 6.0 (L)    Albumin 07/03/2023 3.9    AST 07/03/2023 11 (L)    ALT 07/03/2023 15    Alkaline Phosphatase 07/03/2023 50    Total Bilirubin 07/03/2023 0.5    GFR, Estimated 07/03/2023 >60    Anion gap 07/03/2023 9    IgG (Immunoglobin G), Se* 07/03/2023 652    IgA 07/03/2023 113    IgM (Immunoglobulin M), * 07/03/2023 27    Total Protein ELP 07/03/2023 6.0    Albumin SerPl Elph-Mcnc 07/03/2023 3.6    Alpha 1 07/03/2023 0.1    Alpha2 Glob SerPl Elph-M* 07/03/2023 0.9    B-Globulin SerPl Elph-Mc* 07/03/2023 0.9    Gamma Glob SerPl Elph-Mc* 07/03/2023 0.5    M Protein SerPl Elph-Mcnc 07/03/2023 0.1  (H)    Globulin, Total 07/03/2023 2.4    Albumin/Glob SerPl 07/03/2023 1.6    IFE 1 07/03/2023 Comment (A)    Please Note 07/03/2023 Comment   Lab on 06/09/2023  Component Date Value   Hgb A1c MFr Bld 06/09/2023 8.1 (H)    Mean Plasma Glucose 06/09/2023 186    eAG (mmol/L) 06/09/2023 10.3   There may be more visits with results that are not included.   No image results found.   No results found.  NM PET Image Restage (PS) Whole Body  Result Date: 02/23/2023 CLINICAL DATA:  Subsequent treatment strategy for multiple myeloma. EXAM: NUCLEAR MEDICINE PET WHOLE BODY TECHNIQUE: 8.5 mCi F-18 FDG was injected intravenously. Full-ring PET imaging was performed from the head to foot after the radiotracer. CT data was obtained and used for attenuation correction and anatomic localization. Fasting blood glucose: 200 mg/dl COMPARISON:  63/87/5643. FINDINGS: Mediastinal blood pool activity: SUV max 2.3 HEAD/NECK: No abnormal hypermetabolism. Incidental CT findings: None. CHEST: No abnormal hypermetabolism. Incidental CT findings: Heart is enlarged.  No pericardial or pleural effusion. ABDOMEN/PELVIS: No abnormal hypermetabolism. Incidental CT findings: Liver is unremarkable. Stones in the gallbladder. Adrenal glands, kidneys, spleen, pancreas, stomach and bowel are grossly unremarkable. Atherosclerotic calcification of the aorta. SKELETON: Persistent focal uptake in the anterior left second rib corresponds to an old, possibly pathologic, fracture, SUV max 2.5, stable. No new areas of abnormal hypermetabolism. Otherwise, mild patchy uptake in the spine without focality. No new hypermetabolic lesions. Incidental CT findings: Extensive lytic disease throughout the visualized osseous structures, as before. EXTREMITIES: No focal abnormal hypermetabolism. Incidental CT findings: None. IMPRESSION: 1. Persistent mild uptake associated with the anterior left second rib, possibly due to  an old pathologic fracture. Mild  patchy metabolism throughout the spine is likely treatment related. Extensive lytic disease throughout the visualized osseous structures without new focal areas of abnormal hypermetabolism to suggest metabolically active multiple myeloma. 2. Cholelithiasis. 3.  Aortic atherosclerosis (ICD10-I70.0). Electronically Signed   By: Leanna Battles M.D.   On: 02/23/2023 08:18         Additional Info: This encounter employed real-time, collaborative documentation. The patient actively reviewed and updated their medical record on a shared screen, ensuring transparency and facilitating joint problem-solving for the problem list, overview, and plan. This approach promotes accurate, informed care. The treatment plan was discussed and reviewed in detail, including medication safety, potential side effects, and all patient questions. We confirmed understanding and comfort with the plan. Follow-up instructions were established, including contacting the office for any concerns, returning if symptoms worsen, persist, or new symptoms develop, and precautions for potential emergency department visits.

## 2023-12-09 NOTE — Assessment & Plan Note (Signed)
Multiple Myeloma   He is currently on Revlimid, which is maintaining protein and ferritin levels.  I reviewed oncologist most recent plan (indented below) to coordinate care: -Discussed lab results on 11/20/23 in detail with patient. CBC fairly stable, showed WBC of 7.1K, hemoglobin of 14.9, and platelets of 131K. -platelet level is borderline low -Hgb and WBC normal -CMP shows elevated blood sugars at 266 -last myeloma panel from one month ago stable M protein 0.2 g/dL -kidney and liver function stable -Continue maintenance monthly 15 mg Revlimid po 3 weeks on 1 week off and Daratumumab for high risk multiple myeloma  -educated patient that his treatment regiment is not curative -discussed that to improve the chance of staying in remission for longer, there would be the option of a bone marrow transplant, though the procedure would also not be curative -continue to take vitamin B complex -answered all of patient's questions in detail -continue to follow with PCP to manage blood sugar levels.

## 2023-12-09 NOTE — Assessment & Plan Note (Signed)
Uncontrolled Diabetes Mellitus  - worsening due to language barrier causing medical and insurance navigation challenges. His diabetes is worsening, with recent blood glucose levels reaching 266 mg/dL. He is currently on metformin but non-adherent to semaglutide (Ozempic) and has been unable to obtain [place] due to pharmacy and insurance issues. We discussed the importance of glycemic control to prevent complications such as retinopathy and nephropathy and explained how semaglutide and Marcelline Deist can help lower blood glucose and protect renal function. We will increase semaglutide to 0.5 mg weekly, continue metformin 500 mg, start Farxiga, send prescriptions to our community pharmacy, order blood work today, and follow up in one month to assess glycemic control.  Lab Results  Component Value Date   HGBA1C 8.9 (H) 09/09/2023   HGBA1C 8.1 (H) 06/09/2023   HGBA1C 8.2 (A) 03/09/2023

## 2023-12-10 ENCOUNTER — Other Ambulatory Visit: Payer: Self-pay

## 2023-12-10 LAB — HEMOGLOBIN A1C
Hgb A1c MFr Bld: 8.4 %{Hb} — ABNORMAL HIGH (ref <5.7)
Mean Plasma Glucose: 194 mg/dL
eAG (mmol/L): 10.8 mmol/L

## 2023-12-12 ENCOUNTER — Other Ambulatory Visit: Payer: Self-pay

## 2023-12-13 ENCOUNTER — Encounter: Payer: Self-pay | Admitting: Internal Medicine

## 2023-12-13 NOTE — Progress Notes (Signed)
#   Laboratory Results Documentation Review Date of Review: December 13, 2023 Period Reviewed: September 09, 2023 - December 09, 2023  ## Documentation Review Statement I have reviewed the comprehensive laboratory results spanning from September 09, 2023, through December 09, 2023. This review encompasses multiple Comprehensive Metabolic Panels, Complete Blood Counts, Lipid Panels, and Diabetes markers across five separate collection dates.  ## Key Findings and Trends  ### Diabetes Management The patient demonstrates persistent hyperglycemia with suboptimal control. The HbA1c has shown slight improvement from 8.9% (September) to 8.4% (December), but remains significantly above the target range of less than 7%. Fasting glucose values have fluctuated between 155-266 mg/dL, with the most recent value at 195 mg/dL, indicating continued glycemic management challenges.  ### Hematologic Status There is a notable trend in the platelet count, which has progressively decreased from 280,000/L (September) to 149,000/L (December), now falling below the reference range. The patient exhibits persistent microcytosis with MCV consistently below normal (latest 81.0 fL) and elevated RDW (18.2%), suggesting possible iron deficiency requiring further evaluation.  ### Cardiovascular Risk Factors The lipid panel from December 11 shows: - LDL cholesterol remains elevated at 120 mg/dL - HDL has improved from 33.7 mg/dL to 69.6 mg/dL - Total cholesterol/HDL ratio has improved from 5.0 to 4.0 - Triglycerides remain within normal range at 90 mg/dL  ### Renal Function Kidney function appears stable with: - Creatinine ranging from 0.71-0.78 mg/dL - GFR consistently above 60 mL/min - Recent elevation in microalbumin/creatinine ratio to 9.1, with microalbuminuria of 12.2 mg/dL, suggesting early diabetic nephropathy  ### Protein Studies Serial protein electrophoresis shows: - Stable small M-protein band (0.2 g/dL) -  Consistent pattern requiring continued monitoring - Normal immunoglobulin levels with IgG 698 mg/dL, IgM 30 mg/dL, and IgA 295 mg/dL  ### Liver Function Liver enzymes remain within normal limits with: - AST ranging from 13-32 U/L - ALT ranging from 19-34 U/L - Stable albumin and total protein levels  ## Critical Values No critical values were reported during this review period.  ## Documentation Completeness All laboratory reports are properly dated, timed, and include appropriate reference ranges and abnormal value indicators. Reports include complete CBC with differential, comprehensive metabolic panels, and specialized testing results.  ## Summary Assessment The laboratory data indicates: 1. Suboptimal diabetes control with recent modest improvement 2. Progressive thrombocytopenia requiring monitoring 3. Persistent microcytic indices 4. Early signs of diabetic nephropathy 5. Stable monoclonal gammopathy 6. Improving but suboptimal lipid control  ## Recommendations for Follow-up Based on this review, the following areas require attention: - More frequent monitoring of platelet count trend - Evaluation of iron status given persistent microcytosis - Adjustment of diabetes management plan - Regular monitoring of microalbuminuria - Continued surveillance of M-protein

## 2023-12-18 ENCOUNTER — Inpatient Hospital Stay: Payer: BC Managed Care – PPO | Attending: Hematology

## 2023-12-18 ENCOUNTER — Inpatient Hospital Stay: Payer: BC Managed Care – PPO

## 2023-12-18 VITALS — BP 142/78 | HR 78 | Temp 98.2°F | Resp 18 | Wt 179.5 lb

## 2023-12-18 DIAGNOSIS — C9001 Multiple myeloma in remission: Secondary | ICD-10-CM

## 2023-12-18 DIAGNOSIS — Z5112 Encounter for antineoplastic immunotherapy: Secondary | ICD-10-CM | POA: Diagnosis not present

## 2023-12-18 DIAGNOSIS — C9000 Multiple myeloma not having achieved remission: Secondary | ICD-10-CM | POA: Insufficient documentation

## 2023-12-18 DIAGNOSIS — Z7189 Other specified counseling: Secondary | ICD-10-CM

## 2023-12-18 DIAGNOSIS — Z79899 Other long term (current) drug therapy: Secondary | ICD-10-CM | POA: Diagnosis not present

## 2023-12-18 LAB — CBC WITH DIFFERENTIAL (CANCER CENTER ONLY)
Abs Immature Granulocytes: 0.1 10*3/uL — ABNORMAL HIGH (ref 0.00–0.07)
Basophils Absolute: 0 10*3/uL (ref 0.0–0.1)
Basophils Relative: 0 %
Eosinophils Absolute: 0 10*3/uL (ref 0.0–0.5)
Eosinophils Relative: 0 %
HCT: 42.6 % (ref 39.0–52.0)
Hemoglobin: 14.4 g/dL (ref 13.0–17.0)
Immature Granulocytes: 1 %
Lymphocytes Relative: 19 %
Lymphs Abs: 2 10*3/uL (ref 0.7–4.0)
MCH: 25.9 pg — ABNORMAL LOW (ref 26.0–34.0)
MCHC: 33.8 g/dL (ref 30.0–36.0)
MCV: 76.8 fL — ABNORMAL LOW (ref 80.0–100.0)
Monocytes Absolute: 0.7 10*3/uL (ref 0.1–1.0)
Monocytes Relative: 7 %
Neutro Abs: 7.6 10*3/uL (ref 1.7–7.7)
Neutrophils Relative %: 73 %
Platelet Count: 384 10*3/uL (ref 150–400)
RBC: 5.55 MIL/uL (ref 4.22–5.81)
RDW: 15.7 % — ABNORMAL HIGH (ref 11.5–15.5)
WBC Count: 10.4 10*3/uL (ref 4.0–10.5)
nRBC: 0 % (ref 0.0–0.2)

## 2023-12-18 LAB — COMPREHENSIVE METABOLIC PANEL
ALT: 39 U/L (ref 0–44)
AST: 30 U/L (ref 15–41)
Albumin: 3.7 g/dL (ref 3.5–5.0)
Alkaline Phosphatase: 100 U/L (ref 38–126)
Anion gap: 10 (ref 5–15)
BUN: 9 mg/dL (ref 6–20)
CO2: 25 mmol/L (ref 22–32)
Calcium: 9 mg/dL (ref 8.9–10.3)
Chloride: 103 mmol/L (ref 98–111)
Creatinine, Ser: 0.69 mg/dL (ref 0.61–1.24)
GFR, Estimated: >60 mL/min (ref >60)
Glucose, Bld: 234 mg/dL — ABNORMAL HIGH (ref 70–99)
Potassium: 3.4 mmol/L — ABNORMAL LOW (ref 3.5–5.1)
Sodium: 138 mmol/L (ref 135–145)
Total Bilirubin: 0.5 mg/dL (ref <1.2)
Total Protein: 6.7 g/dL (ref 6.5–8.1)

## 2023-12-18 MED ORDER — ZOLEDRONIC ACID 4 MG/100ML IV SOLN
4.0000 mg | Freq: Once | INTRAVENOUS | Status: AC
Start: 2023-12-18 — End: 2023-12-18
  Administered 2023-12-18: 4 mg via INTRAVENOUS
  Filled 2023-12-18: qty 100

## 2023-12-18 MED ORDER — MONTELUKAST SODIUM 10 MG PO TABS
10.0000 mg | ORAL_TABLET | Freq: Once | ORAL | Status: AC
Start: 2023-12-18 — End: 2023-12-18
  Administered 2023-12-18: 10 mg via ORAL
  Filled 2023-12-18: qty 1

## 2023-12-18 MED ORDER — FAMOTIDINE IN NACL 20-0.9 MG/50ML-% IV SOLN
20.0000 mg | Freq: Once | INTRAVENOUS | Status: AC
  Administered 2023-12-18: 20 mg via INTRAVENOUS
  Filled 2023-12-18: qty 50

## 2023-12-18 MED ORDER — SODIUM CHLORIDE 0.9 % IV SOLN
16.0000 mg/kg | Freq: Once | INTRAVENOUS | Status: AC
  Administered 2023-12-18: 1300 mg via INTRAVENOUS
  Filled 2023-12-18: qty 5

## 2023-12-18 MED ORDER — ACETAMINOPHEN 325 MG PO TABS
650.0000 mg | ORAL_TABLET | Freq: Once | ORAL | Status: AC
  Administered 2023-12-18: 650 mg via ORAL
  Filled 2023-12-18: qty 2

## 2023-12-18 MED ORDER — SODIUM CHLORIDE 0.9 % IV SOLN: Freq: Once | INTRAVENOUS | Status: AC

## 2023-12-18 MED ORDER — DEXAMETHASONE SODIUM PHOSPHATE 10 MG/ML IJ SOLN
2.0000 mg | Freq: Once | INTRAMUSCULAR | Status: AC
  Administered 2023-12-18: 2 mg via INTRAVENOUS
  Filled 2023-12-18: qty 1

## 2023-12-18 MED ORDER — DIPHENHYDRAMINE HCL 25 MG PO CAPS
50.0000 mg | ORAL_CAPSULE | Freq: Once | ORAL | Status: AC
  Administered 2023-12-18: 50 mg via ORAL
  Filled 2023-12-18: qty 2

## 2023-12-18 NOTE — Progress Notes (Signed)
Pt given Revlimid rx on 12/18/23. Pt to start taking on 12/23/23 and take for 21 days:12/23/23 - 01/13/24 Pt will take 7 days off from 01/14/24 - 01/20/24. Pt will restart next cycle on 01/21/24. Pt is aware of dates and verbalizes understanding.

## 2023-12-18 NOTE — Patient Instructions (Signed)
[facility] DE[facility]  Discharge Instructions: Thank you for choosing [facility] to provide your oncology and hematology care.   If you have a lab appointment with the Cancer Center, please go directly to the Cancer Center and check in at the registration area.   Wear comfortable clothing and clothing appropriate for easy access to any Portacath or PICC line.   We strive to give you quality time with your provider. You may need to reschedule your appointment if you arrive late (15 or more minutes).  Arriving late affects you and other patients whose appointments are after yours.  Also, if you miss three or more appointments without notifying the office, you may be dismissed from the clinic at the provider's discretion.      For prescription refill requests, have your pharmacy contact our office and allow 72 hours for refills to be completed.    Today you received the following chemotherapy and/or immunotherapy agents: daratumumab      To help prevent nausea and vomiting after your treatment, we encourage you to take your nausea medication as directed.  BELOW ARE SYMPTOMS THAT SHOULD BE REPORTED IMMEDIATELY: *FEVER GREATER THAN 100.4 F (38 C) OR HIGHER *CHILLS OR SWEATING *NAUSEA AND VOMITING THAT IS NOT CONTROLLED WITH YOUR NAUSEA MEDICATION *UNUSUAL SHORTNESS OF BREATH *UNUSUAL BRUISING OR BLEEDING *URINARY PROBLEMS (pain or burning when urinating, or frequent urination) *BOWEL PROBLEMS (unusual diarrhea, constipation, pain near the anus) TENDERNESS IN MOUTH AND THROAT WITH OR WITHOUT PRESENCE OF ULCERS (sore throat, sores in mouth, or a toothache) UNUSUAL RASH, SWELLING OR PAIN  UNUSUAL VAGINAL DISCHARGE OR ITCHING   Items with * indicate a potential emergency and should be followed up as soon as possible or go to the Emergency Department if any problems should occur.  Please show the CHEMOTHERAPY ALERT CARD or  IMMUNOTHERAPY ALERT CARD at check-in to the Emergency Department and triage nurse.  Should you have questions after your visit or need to cancel or reschedule your appointment, please contact [facility] - A DEPT OF [facility]  Dept: [phone]  and follow the prompts.  Office hours are 8:00 a.m. to 4:30 p.m. Monday - Friday. Please note that voicemails left after 4:00 p.m. may not be returned until the following business day.  We are closed weekends and major holidays. You have access to a nurse at all times for urgent questions. Please call the main number to the clinic Dept: [phone] and follow the prompts.   For any non-urgent questions, you may also contact your provider using MyChart. We now offer e-Visits for anyone 73 and older to request care online for non-urgent symptoms. For details visit [email or web address].   Also download the MyChart app! Go to the app store, search "MyChart", open the app, select [place], and log in with your MyChart username and password.

## 2023-12-21 NOTE — Telephone Encounter (Signed)
Mailed to pt

## 2023-12-28 ENCOUNTER — Encounter: Payer: Self-pay | Admitting: Hematology

## 2023-12-29 ENCOUNTER — Other Ambulatory Visit: Payer: Self-pay

## 2024-01-01 LAB — MULTIPLE MYELOMA PANEL, SERUM
Albumin SerPl Elph-Mcnc: 2.8 g/dL — ABNORMAL LOW (ref 2.9–4.4)
Albumin/Glob SerPl: 0.9 (ref 0.7–1.7)
Alpha 1: 0.4 g/dL (ref 0.0–0.4)
Alpha2 Glob SerPl Elph-Mcnc: 1.5 g/dL — ABNORMAL HIGH (ref 0.4–1.0)
B-Globulin SerPl Elph-Mcnc: 0.9 g/dL (ref 0.7–1.3)
Gamma Glob SerPl Elph-Mcnc: 0.4 g/dL (ref 0.4–1.8)
Globulin, Total: 3.2 g/dL (ref 2.2–3.9)
IgA: 106 mg/dL (ref 90–386)
IgG (Immunoglobin G), Serum: 584 mg/dL — ABNORMAL LOW (ref 603–1613)
IgM (Immunoglobulin M), Srm: 19 mg/dL — ABNORMAL LOW (ref 20–172)
M Protein SerPl Elph-Mcnc: 0.2 g/dL — ABNORMAL HIGH
Total Protein ELP: 6 g/dL (ref 6.0–8.5)

## 2024-01-05 ENCOUNTER — Other Ambulatory Visit: Payer: Self-pay | Admitting: Hematology

## 2024-01-05 DIAGNOSIS — C9000 Multiple myeloma not having achieved remission: Secondary | ICD-10-CM

## 2024-01-06 ENCOUNTER — Encounter: Payer: Self-pay | Admitting: Hematology

## 2024-01-11 ENCOUNTER — Ambulatory Visit: Payer: Self-pay | Admitting: Internal Medicine

## 2024-01-13 ENCOUNTER — Encounter: Payer: Self-pay | Admitting: Hematology

## 2024-01-13 NOTE — Progress Notes (Signed)
HEMATOLOGY/ONCOLOGY CLINIC NOTE  Date of Service: 01/15/24  Chief complaint - Follow-up for continued evaluation and management of high risk multiple myeloma  Current treatment-daratumumab/Revlimid/dexamethasone Has not f/u on and does not appear keen to consider Melphalan/HDT-AUTOHSCT  INTERVAL HISTORY:  Donald Suarez is a 58 y.o. male is here for continued evaluation and management of his multiple myeloma. Patient was last seen by me on 11/20/2023 and   Today, he is scheduled to receive day 1 cycle 17 of treatment. Patient is accompanied by an interpreter. He denies any new concerns over the last two months.  He has been taking Revlimid regularly as prescribed. Patient has been tolerating treatment well with no toxicity issues.   Patient denies any new bone pain, fatigue, dizziness, leg swelling, abdominal, chest pain, SOB, back pain, or recent infections.   Patient reports a productive cough present over the last week. He does bring up yellow phlegm. Patient denies any concern for fever. He denies smoking or history of asthma. He denies any allergies to any antibiotics.   Patient reports that he is UTD with his age-appropriate vaccinations including flu, COVID-19, and pneumonia vaccines.   Patient reports that his blood sugars at home range 105-200s. He denies any persistently low or elevated blood sugar levels.   MEDICAL HISTORY:  Past Medical History:  Diagnosis Date   Abfraction 08/06/2022   Accretions on teeth 08/06/2022   Anemia    Attrition, teeth excessive 08/06/2022   Caries 08/06/2022   Defective dental restoration 08/06/2022   Elevated ferritin 08/07/2022   Hypocalcemia 08/19/2022   In setting of multiple myeloma    Loose, teeth 08/06/2022   Malocclusion 08/06/2022   Microalbuminuria 08/19/2022   Periodontal disease 08/06/2022   Peripheral neuropathy 09/08/2022   Started 08/2022 A/w diabetes and revlimid usage Burning on top of left foot   Teeth missing  08/06/2022    SURGICAL HISTORY: Past Surgical History:  Procedure Laterality Date   APPENDECTOMY     [facility] BONE MARROW BIOPSY & ASPIRATION  02/11/2023   [place] FLUORO GUIDE CV LINE RIGHT  05/15/2022   [place] PATIENT EVAL TECH 0-60 MINS  05/19/2022   Normal US GUIDE VASC ACCESS RIGHT  05/15/2022    SOCIAL HISTORY: Social History   Socioeconomic History   Marital status: Married    Spouse name: Not on file   Number of children: Not on file   Years of education: Not on file   Highest education level: Not on file  Occupational History   Not on file  Tobacco Use   Smoking status: Never   Smokeless tobacco: Never  Vaping Use   Vaping status: Never Used  Substance and Sexual Activity   Alcohol use: Never   Drug use: Never   Sexual activity: Not on file  Other Topics Concern   Not on file  Social History Narrative   Not on file   Social Drivers of Health   Financial Resource Strain: Medium Risk (05/21/2022)   Overall Financial Resource Strain (CARDIA)    Difficulty of Paying Living Expenses: Somewhat hard  Food Insecurity: Food Insecurity Present (05/21/2022)   Hunger Vital Sign    Worried About Running Out of Food in the Last Year: Sometimes true    Ran Out of Food in the Last Year: Sometimes true  Transportation Needs: No Transportation Needs (05/21/2022)   PRAPARE - Administrator, Civil Service (Medical): No    Lack of Transportation (Non-Medical): No  Physical Activity: Not on file  Stress: Not on file  Social Connections: Not on file  Intimate Partner Violence: Not on file    FAMILY HISTORY: No family history on file.  ALLERGIES:  has no known allergies.  MEDICATIONS:  . Allergies as of 01/15/2024   No Known Allergies      Medication List        Accurate as of January 13, 2024  5:27 PM. If you have any questions, ask your nurse or doctor.          Accu-Chek Guide test strip Generic drug: glucose blood Use to test blood sugars up to 4 times daily as  directed.   Accu-Chek Guide w/Device Kit Use to test blood sugars up to 4 times daily as directed.   Accu-Chek Softclix Lancets lancets Use to test blood sugars up to 4 times daily as directed.   acyclovir 400 MG tablet Commonly known as: ZOVIRAX Take 1 tablet (400 mg total) by mouth 2 (two) times daily.   aspirin EC 81 MG tablet Take 1 tablet (81 mg total) by mouth daily. Swallow whole.   B-12 1000 MCG Caps Take 1 tablet by mouth daily.   Farxiga 10 MG Tabs tablet Generic drug: dapagliflozin propanediol Take 1 tablet (10 mg total) by mouth daily before breakfast.   gabapentin 300 MG capsule Commonly known as: NEURONTIN Take 1 capsule (300 mg total) by mouth 3 (three) times daily.   Klor-Con M20 20 MEQ tablet Generic drug: potassium chloride SA TAKE 1 TABLET BY MOUTH EVERY DAY   lenalidomide 25 MG capsule Commonly known as: REVLIMID TAKE 1 CAPSULE BY MOUTH 1 TIME A DAY FOR 21 DAYS ON THEN 7 DAYS OFF   metFORMIN 500 MG tablet Commonly known as: GLUCOPHAGE Take 2 tablets (1,000 mg total) by mouth 2 (two) times daily with a meal. Note dose increased. Replaces 500 mg twice daily.   Mounjaro 2.5 MG/0.5ML Pen Generic drug: tirzepatide Inject 2.5 mg into the skin once a week.   ondansetron 8 MG tablet Commonly known as: Zofran Take 1 tablet (8 mg total) by mouth every 8 (eight) hours as needed for nausea or vomiting.   Ozempic (0.25 or 0.5 MG/DOSE) 2 MG/3ML Sopn Generic drug: Semaglutide(0.25 or 0.5MG /DOS) Inject 0.25 mg into the skin once a week.   prochlorperazine 10 MG tablet Commonly known as: COMPAZINE Take 1 tablet (10 mg total) by mouth every 6 (six) hours as needed for nausea or vomiting.         REVIEW OF SYSTEMS:    10 Point review of Systems was done is negative except as noted above.   PHYSICAL EXAMINATION: .There were no vitals taken for this visit.   GENERAL:alert, in no acute distress and comfortable SKIN: no acute rashes, no significant  lesions EYES: conjunctiva are pink and non-injected, sclera anicteric OROPHARYNX: MMM, no exudates, no oropharyngeal erythema or ulceration NECK: supple, no JVD LYMPH:  no palpable lymphadenopathy in the cervical, axillary or inguinal regions LUNGS: clear to auscultation b/l with normal respiratory effort HEART: regular rate & rhythm ABDOMEN:  normoactive bowel sounds , non tender, not distended. Extremity: no pedal edema PSYCH: alert & oriented x 3 with fluent speech NEURO: no focal motor/sensory deficits     LABORATORY DATA:  I have reviewed the data as listed  .    Latest Ref Rng & Units 12/18/2023    9:52 AM 12/09/2023    9:02 AM 11/20/2023   10:36 AM  CBC  WBC 4.0 - 10.5 K/uL 10.4  7.7  7.1   Hemoglobin 13.0 - 17.0 g/dL 96.2  95.2  84.1   Hematocrit 39.0 - 52.0 % 42.6  47.9  45.7   Platelets 150 - 400 K/uL 384  149.0  131     .    Latest Ref Rng & Units 12/18/2023    9:52 AM 12/09/2023    9:02 AM 11/20/2023   10:36 AM  CMP  Glucose 70 - 99 mg/dL 324  401  027   BUN 6 - 20 mg/dL 9  12  11    Creatinine 0.61 - 1.24 mg/dL 2.53  6.64  4.03   Sodium 135 - 145 mmol/L 138  135  139   Potassium 3.5 - 5.1 mmol/L 3.4  3.3  3.7   Chloride 98 - 111 mmol/L 103  102  107   CO2 22 - 32 mmol/L 25  22  26    Calcium 8.9 - 10.3 mg/dL 9.0  8.4  9.0   Total Protein 6.5 - 8.1 g/dL 6.7  6.9  6.5   Total Bilirubin <1.2 mg/dL 0.5  0.7  0.6   Alkaline Phos 38 - 126 U/L 100  61  56   AST 15 - 41 U/L 30  32  14   ALT 0 - 44 U/L 39  26  19    . Lab Results  Component Value Date   LDH 114 10/10/2022    Bone marrow biopsy 02/11/2023   RADIOGRAPHIC STUDIES: I have personally reviewed the radiological images as listed and agreed with the findings in the report. No results found.  ASSESSMENT & PLAN:   58 y.o. 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  Patient diagnosed December  2021 and received 1 cycle of CyBorD. Had previously presented with anemia renal insufficiency and hyperviscosity symptoms. Bx- 90% plasma cells Initial M spike 8.03g/dl KVQ(2V): Not Detected  Dup(1q): Not Detected  Gains(15): DETECTED  Gains(5 and 9): Not Detected  Del(13q)/-13: DETECTED  Del(17p)(TP53): Not Detected  IGH(Rearrangement): SEE BELOW   IgH complex: t(4;14): DETECTED  t(11;14): Not Detected  t(14;16): Not Detected  t(14;20): Not Detected   Cytogenetics: Normal male karyotype.   -Labs from 05/13/2022-beta-2 microglobulin 4.4, LDH 197, lambda free light chain 77.1, kappa, lambda light chain ratio 0.08, multiple myeloma panel pending.  UPEP ordered but not yet collected. 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  Due to multiple myeloma   PLAN  -Discussed lab results on 01/15/24 in detail with patient. CBC normal, showed WBC of 11.5K, hemoglobin of 15.0, and platelets of 172K. -CMP stable -his last myeloma panel from 12/18/2023 showed that his M protein was stable at 0.2 g/dL -myeloma panel from today is pending at time of clinical visit -Continue maintenance monthly 15 mg Revlimid po 3 weeks on 1 week off and Daratumumab for high risk multiple myeloma  -I did hear crackles in the lungs during physical examination -will order a short course of Doxycycline for  7-10 days to address his coughing symptom and ensure he does not develop a larger infection -recommend breathing in steam to  help clear his secretions -advised patient to let us know if his cough worsens or if he develops a fever -answered all of patient's questions in detail -continue to follow with PCP to manage blood sugar levels.   Follow-up Plz schedule per integrated scheduling MD visit in 4 weeks  The total time spent in the appointment was *** minutes* .  All of the patient's questions were answered with apparent satisfaction. The patient knows to call the clinic with any problems, questions or concerns.   Wyvonnia Lora MD MS AAHIVMS [facility] Hematology/Oncology [facility]  .*Total Encounter Time as defined by the Centers for Medicare and Medicaid Services includes, in addition to the face-to-face time of a patient visit (documented in the note above) non-face-to-face time: obtaining and reviewing outside history, ordering and reviewing medications, tests or procedures, care coordination (communications with other health care professionals or caregivers) and documentation in the medical record.    I,Mitra Faeizi,acting as a Neurosurgeon for Wyvonnia Lora, MD.,have documented all relevant documentation on the behalf of Wyvonnia Lora, MD,as directed by  Wyvonnia Lora, MD while in the presence of Wyvonnia Lora, MD.  ***

## 2024-01-15 ENCOUNTER — Inpatient Hospital Stay: Payer: 59 | Attending: Hematology

## 2024-01-15 ENCOUNTER — Inpatient Hospital Stay: Payer: 59

## 2024-01-15 ENCOUNTER — Inpatient Hospital Stay: Payer: 59 | Admitting: Hematology

## 2024-01-15 VITALS — BP 122/79 | HR 85 | Resp 16

## 2024-01-15 VITALS — BP 147/84 | HR 85 | Temp 97.6°F | Resp 18 | Ht 66.0 in | Wt 172.7 lb

## 2024-01-15 DIAGNOSIS — C9000 Multiple myeloma not having achieved remission: Secondary | ICD-10-CM

## 2024-01-15 DIAGNOSIS — J069 Acute upper respiratory infection, unspecified: Secondary | ICD-10-CM | POA: Diagnosis not present

## 2024-01-15 DIAGNOSIS — Z7985 Long-term (current) use of injectable non-insulin antidiabetic drugs: Secondary | ICD-10-CM | POA: Diagnosis not present

## 2024-01-15 DIAGNOSIS — Z7189 Other specified counseling: Secondary | ICD-10-CM

## 2024-01-15 DIAGNOSIS — N289 Disorder of kidney and ureter, unspecified: Secondary | ICD-10-CM | POA: Insufficient documentation

## 2024-01-15 DIAGNOSIS — Z5111 Encounter for antineoplastic chemotherapy: Secondary | ICD-10-CM | POA: Diagnosis not present

## 2024-01-15 DIAGNOSIS — C9001 Multiple myeloma in remission: Secondary | ICD-10-CM

## 2024-01-15 DIAGNOSIS — Z5112 Encounter for antineoplastic immunotherapy: Secondary | ICD-10-CM | POA: Insufficient documentation

## 2024-01-15 DIAGNOSIS — R0989 Other specified symptoms and signs involving the circulatory and respiratory systems: Secondary | ICD-10-CM | POA: Insufficient documentation

## 2024-01-15 DIAGNOSIS — E1142 Type 2 diabetes mellitus with diabetic polyneuropathy: Secondary | ICD-10-CM | POA: Insufficient documentation

## 2024-01-15 DIAGNOSIS — Z7984 Long term (current) use of oral hypoglycemic drugs: Secondary | ICD-10-CM | POA: Insufficient documentation

## 2024-01-15 DIAGNOSIS — D649 Anemia, unspecified: Secondary | ICD-10-CM | POA: Diagnosis not present

## 2024-01-15 DIAGNOSIS — R058 Other specified cough: Secondary | ICD-10-CM | POA: Insufficient documentation

## 2024-01-15 LAB — COMPREHENSIVE METABOLIC PANEL
ALT: 17 U/L (ref 0–44)
AST: 13 U/L — ABNORMAL LOW (ref 15–41)
Albumin: 4.1 g/dL (ref 3.5–5.0)
Alkaline Phosphatase: 70 U/L (ref 38–126)
Anion gap: 9 (ref 5–15)
BUN: 13 mg/dL (ref 6–20)
CO2: 24 mmol/L (ref 22–32)
Calcium: 9.5 mg/dL (ref 8.9–10.3)
Chloride: 104 mmol/L (ref 98–111)
Creatinine, Ser: 0.79 mg/dL (ref 0.61–1.24)
GFR, Estimated: >60 mL/min (ref >60)
Glucose, Bld: 191 mg/dL — ABNORMAL HIGH (ref 70–99)
Potassium: 3.4 mmol/L — ABNORMAL LOW (ref 3.5–5.1)
Sodium: 137 mmol/L (ref 135–145)
Total Bilirubin: 0.6 mg/dL (ref 0.0–1.2)
Total Protein: 7.1 g/dL (ref 6.5–8.1)

## 2024-01-15 LAB — CBC WITH DIFFERENTIAL (CANCER CENTER ONLY)
Abs Immature Granulocytes: 0.04 10*3/uL (ref 0.00–0.07)
Basophils Absolute: 0.1 10*3/uL (ref 0.0–0.1)
Basophils Relative: 1 %
Eosinophils Absolute: 0.1 10*3/uL (ref 0.0–0.5)
Eosinophils Relative: 1 %
HCT: 45.3 % (ref 39.0–52.0)
Hemoglobin: 15 g/dL (ref 13.0–17.0)
Immature Granulocytes: 0 %
Lymphocytes Relative: 31 %
Lymphs Abs: 3.5 10*3/uL (ref 0.7–4.0)
MCH: 26.5 pg (ref 26.0–34.0)
MCHC: 33.1 g/dL (ref 30.0–36.0)
MCV: 79.9 fL — ABNORMAL LOW (ref 80.0–100.0)
Monocytes Absolute: 1.5 10*3/uL — ABNORMAL HIGH (ref 0.1–1.0)
Monocytes Relative: 13 %
Neutro Abs: 6.3 10*3/uL (ref 1.7–7.7)
Neutrophils Relative %: 54 %
Platelet Count: 172 10*3/uL (ref 150–400)
RBC: 5.67 MIL/uL (ref 4.22–5.81)
RDW: 16.2 % — ABNORMAL HIGH (ref 11.5–15.5)
WBC Count: 11.5 10*3/uL — ABNORMAL HIGH (ref 4.0–10.5)
nRBC: 0 % (ref 0.0–0.2)

## 2024-01-15 MED ORDER — MONTELUKAST SODIUM 10 MG PO TABS
10.0000 mg | ORAL_TABLET | Freq: Once | ORAL | Status: AC
Start: 2024-01-15 — End: 2024-01-15
  Administered 2024-01-15: 10 mg via ORAL
  Filled 2024-01-15: qty 1

## 2024-01-15 MED ORDER — SODIUM CHLORIDE 0.9 % IV SOLN
16.0000 mg/kg | Freq: Once | INTRAVENOUS | Status: AC
  Administered 2024-01-15: 1300 mg via INTRAVENOUS
  Filled 2024-01-15: qty 5

## 2024-01-15 MED ORDER — DIPHENHYDRAMINE HCL 25 MG PO CAPS
50.0000 mg | ORAL_CAPSULE | Freq: Once | ORAL | Status: AC
  Administered 2024-01-15: 50 mg via ORAL
  Filled 2024-01-15: qty 2

## 2024-01-15 MED ORDER — DEXAMETHASONE SODIUM PHOSPHATE 10 MG/ML IJ SOLN
2.0000 mg | Freq: Once | INTRAMUSCULAR | Status: AC
  Administered 2024-01-15: 2 mg via INTRAVENOUS
  Filled 2024-01-15: qty 1

## 2024-01-15 MED ORDER — ZOLEDRONIC ACID 4 MG/100ML IV SOLN
4.0000 mg | Freq: Once | INTRAVENOUS | Status: AC
Start: 2024-01-15 — End: 2024-01-15
  Administered 2024-01-15: 4 mg via INTRAVENOUS
  Filled 2024-01-15: qty 100

## 2024-01-15 MED ORDER — SODIUM CHLORIDE 0.9 % IV SOLN: Freq: Once | INTRAVENOUS | Status: AC

## 2024-01-15 MED ORDER — DOXYCYCLINE HYCLATE 100 MG PO TABS: 100.0000 mg | ORAL_TABLET | Freq: Two times a day (BID) | ORAL | 0 refills | Status: AC

## 2024-01-15 MED ORDER — BENZONATATE 100 MG PO CAPS: 100.0000 mg | ORAL_CAPSULE | Freq: Two times a day (BID) | ORAL | 0 refills | Status: DC | PRN

## 2024-01-15 MED ORDER — FAMOTIDINE IN NACL 20-0.9 MG/50ML-% IV SOLN
20.0000 mg | Freq: Once | INTRAVENOUS | Status: AC
  Administered 2024-01-15: 20 mg via INTRAVENOUS
  Filled 2024-01-15: qty 50

## 2024-01-15 MED ORDER — ACETAMINOPHEN 325 MG PO TABS
650.0000 mg | ORAL_TABLET | Freq: Once | ORAL | Status: AC
  Administered 2024-01-15: 650 mg via ORAL
  Filled 2024-01-15: qty 2

## 2024-01-15 NOTE — Progress Notes (Signed)
Patient seen by Dr. Kale  Vitals are within treatment parameters.  Labs reviewed: and are within treatment parameters.  Per physician team, patient is ready for treatment and there are NO modifications to the treatment plan.  

## 2024-01-15 NOTE — Patient Instructions (Signed)
 [facility] - A DEPT OF [facility]  Discharge Instructions: Thank you for choosing [facility] to provide your oncology and hematology care.   If you have a lab appointment with the Cancer Center, please go directly to the Cancer Center and check in at the registration area.   Wear comfortable clothing and clothing appropriate for easy access to any Portacath or PICC line.   We strive to give you quality time with your provider. You may need to reschedule your appointment if you arrive late (15 or more minutes).  Arriving late affects you and other patients whose appointments are after yours.  Also, if you miss three or more appointments without notifying the office, you may be dismissed from the clinic at the provider's discretion.      For prescription refill requests, have your pharmacy contact our office and allow 72 hours for refills to be completed.    Today you received the following chemotherapy and/or immunotherapy agents: daratumumab      To help prevent nausea and vomiting after your treatment, we encourage you to take your nausea medication as directed.  BELOW ARE SYMPTOMS THAT SHOULD BE REPORTED IMMEDIATELY: *FEVER GREATER THAN 100.4 F (38 C) OR HIGHER *CHILLS OR SWEATING *NAUSEA AND VOMITING THAT IS NOT CONTROLLED WITH YOUR NAUSEA MEDICATION *UNUSUAL SHORTNESS OF BREATH *UNUSUAL BRUISING OR BLEEDING *URINARY PROBLEMS (pain or burning when urinating, or frequent urination) *BOWEL PROBLEMS (unusual diarrhea, constipation, pain near the anus) TENDERNESS IN MOUTH AND THROAT WITH OR WITHOUT PRESENCE OF ULCERS (sore throat, sores in mouth, or a toothache) UNUSUAL RASH, SWELLING OR PAIN  UNUSUAL VAGINAL DISCHARGE OR ITCHING   Items with * indicate a potential emergency and should be followed up as soon as possible or go to the Emergency Department if any problems should occur.  Please show the CHEMOTHERAPY ALERT CARD or  IMMUNOTHERAPY ALERT CARD at check-in to the Emergency Department and triage nurse.  Should you have questions after your visit or need to cancel or reschedule your appointment, please contact [facility] - A DEPT OF [facility]  Dept: [phone]  and follow the prompts.  Office hours are 8:00 a.m. to 4:30 p.m. Monday - Friday. Please note that voicemails left after 4:00 p.m. may not be returned until the following business day.  We are closed weekends and major holidays. You have access to a nurse at all times for urgent questions. Please call the main number to the clinic Dept: [phone] and follow the prompts.   For any non-urgent questions, you may also contact your provider using MyChart. We now offer e-Visits for anyone 73 and older to request care online for non-urgent symptoms. For details visit [email or web address].   Also download the MyChart app! Go to the app store, search "MyChart", open the app, select [place], and log in with your MyChart username and password.

## 2024-01-15 NOTE — Progress Notes (Signed)
Pt given Revlimid rx on 01/15/24. Pt to start taking on 01/21/24 and take for 21 days:01/21/24 - 02/10/24 Pt will take 7 days off from 02/11/24 - 02/17/24. Pt will restart next cycle on 02/18/24. Pt is aware of dates and verbalizes understanding.

## 2024-01-19 ENCOUNTER — Other Ambulatory Visit: Payer: Self-pay

## 2024-01-20 ENCOUNTER — Other Ambulatory Visit (HOSPITAL_BASED_OUTPATIENT_CLINIC_OR_DEPARTMENT_OTHER): Payer: Self-pay

## 2024-01-20 ENCOUNTER — Other Ambulatory Visit: Payer: Self-pay

## 2024-01-20 ENCOUNTER — Ambulatory Visit: Payer: 59 | Admitting: Internal Medicine

## 2024-01-20 ENCOUNTER — Other Ambulatory Visit: Payer: Self-pay | Admitting: Internal Medicine

## 2024-01-20 ENCOUNTER — Encounter: Payer: Self-pay | Admitting: Internal Medicine

## 2024-01-20 VITALS — BP 138/88 | HR 64 | Temp 97.3°F | Ht 66.0 in | Wt 177.4 lb

## 2024-01-20 DIAGNOSIS — Z794 Long term (current) use of insulin: Secondary | ICD-10-CM

## 2024-01-20 DIAGNOSIS — C9001 Multiple myeloma in remission: Secondary | ICD-10-CM

## 2024-01-20 DIAGNOSIS — E876 Hypokalemia: Secondary | ICD-10-CM | POA: Diagnosis not present

## 2024-01-20 DIAGNOSIS — Z758 Other problems related to medical facilities and other health care: Secondary | ICD-10-CM

## 2024-01-20 DIAGNOSIS — E1165 Type 2 diabetes mellitus with hyperglycemia: Secondary | ICD-10-CM

## 2024-01-20 DIAGNOSIS — G609 Hereditary and idiopathic neuropathy, unspecified: Secondary | ICD-10-CM

## 2024-01-20 DIAGNOSIS — Z603 Acculturation difficulty: Secondary | ICD-10-CM

## 2024-01-20 DIAGNOSIS — R809 Proteinuria, unspecified: Secondary | ICD-10-CM | POA: Diagnosis not present

## 2024-01-20 DIAGNOSIS — E785 Hyperlipidemia, unspecified: Secondary | ICD-10-CM

## 2024-01-20 MED ORDER — TIRZEPATIDE 5 MG/0.5ML ~~LOC~~ SOAJ: 5.0000 mg | SUBCUTANEOUS | 3 refills | Status: DC

## 2024-01-20 MED ORDER — DEXCOM G7 RECEIVER DEVI: 1.0000 | 1 refills | Status: DC

## 2024-01-20 MED ORDER — ROSUVASTATIN CALCIUM 10 MG PO TABS: 10.0000 mg | ORAL_TABLET | Freq: Every day | ORAL | 1 refills | Status: AC

## 2024-01-20 MED ORDER — FREESTYLE LIBRE 3 READER DEVI: 1.0000 | Freq: Every day | 3 refills | Status: DC

## 2024-01-20 MED ORDER — FREESTYLE LIBRE 3 SENSOR MISC: 1.0000 | Freq: Every day | 5 refills | Status: DC

## 2024-01-20 MED ORDER — DEXCOM G7 SENSOR MISC: 1.0000 | 11 refills | Status: DC

## 2024-01-20 NOTE — Patient Instructions (Addendum)
It was a pleasure seeing you today! Your health and satisfaction are our top priorities.  Glenetta Hew, MD  Your Providers PCP: Lula Olszewski, MD,  [phone]) Referring Provider: Lula Olszewski, MD,  [phone]) Care Team Provider: Johney Maine, MD,  [phone])   VISIT SUMMARY:  During today's visit, we reviewed your diabetes management, kidney health, and other ongoing treatments. We discussed your current medications and addressed your concerns about blood sugar fluctuations and visual disturbances. We also talked about the importance of regular eye check-ups and maintaining a balanced diet to support your overall health.  YOUR PLAN:  -TYPE 2 DIABETES MELLITUS: Type 2 diabetes is a condition where your body does not use insulin properly, leading to high blood sugar levels. Your A1c is currently at 8.4%, which is above the target of less than 7%. To help manage your blood sugar, we are increasing your tirzepatide dose to 5 mg weekly. Please pick up the prescription from [facility] pharmacy. We also discussed using a continuous glucose monitor (CGM) and reducing your rice intake. It's important to adhere to your medication and dietary recommendations. We will follow up in one month with your blood glucose readings.  -DIABETIC RETINOPATHY: Diabetic retinopathy is a diabetes complication that affects the eyes and can lead to vision loss. Your variable vision quality is likely due to high blood sugar levels. It's crucial to control your blood sugar to prevent further vision issues. We are referring you to an ophthalmologist for a detailed eye examination.  -CHRONIC KIDNEY DISEASE: Chronic kidney disease is a condition where the kidneys gradually lose function. You are currently taking Farxiga to help protect your kidneys and manage your blood sugar. There are no new issues with your kidney health at this time.  -HYPOKALEMIA: Hypokalemia is a condition where you have low  potassium levels in your blood. Your potassium levels are slightly low, so please continue taking your potassium supplements and try to eat more potassium-rich foods like bananas and oranges.  -MULTIPLE MYELOMA: Multiple myeloma is a type of blood cancer that affects plasma cells. You are continuing your treatment with Revlimid as prescribed. There are no new issues with your myeloma at this time.  -NEUROPATHIC PAIN: Neuropathic pain is nerve pain that can be caused by various conditions, including diabetes. You are currently taking gabapentin to manage this pain, and there are no new issues at this time.  -HERPES SIMPLEX VIRUS: Herpes simplex virus is a viral infection that can cause sores and other symptoms. You are continuing your treatment with acyclovir as prescribed by Dr. Candise Che. There are no new issues at this time.  -VITAMIN B12 DEFICIENCY: Vitamin B12 deficiency can cause fatigue and other symptoms. You are continuing your B12 supplements as prescribed, and there are no new issues at this time.  INSTRUCTIONS:  Please follow up in one month with your blood glucose readings and CGM data. Additionally, make an appointment with an ophthalmologist for a detailed eye examination.  NEXT STEPS: [x]  Early Intervention: Schedule sooner appointment, call our on-call services, or go to emergency room if there is any significant Increase in pain or discomfort New or worsening symptoms Sudden or severe changes in your health [x]  Flexible Follow-Up: We recommend a No follow-ups on file. for optimal routine care. This allows for progress monitoring and treatment adjustments. [x]  Preventive Care: Schedule your annual preventive care visit! It's typically covered by insurance and helps identify potential health issues early. [x]  Lab & X-ray Appointments: Incomplete tests scheduled today,  or call to schedule. X-rays: [facility] (M-F, 8:30am-noon or 1pm-5pm). [x]  Medical Information  Release: Sign a release form at front desk to obtain relevant medical information we don't have.  MAKING THE MOST OF OUR FOCUSED 20 MINUTE APPOINTMENTS: [x]   Clearly state your top concerns at the beginning of the visit to focus our discussion [x]   If you anticipate you will need more time, please inform the front desk during scheduling - we can book multiple appointments in the same week. [x]   If you have transportation problems- use our convenient video appointments or ask about transportation support. [x]   We can get down to business faster if you use MyChart to update information before the visit and submit non-urgent questions before your visit. Thank you for taking the time to provide details through MyChart.  Let our nurse know and she can import this information into your encounter documents.  Arrival and Wait Times: [x]   Arriving on time ensures that everyone receives prompt attention. [x]   Early morning (8a) and afternoon (1p) appointments tend to have shortest wait times. [x]   Unfortunately, we cannot delay appointments for late arrivals or hold slots during phone calls.  Getting Answers and Following Up [x]   Simple Questions & Concerns: For quick questions or basic follow-up after your visit, reach [name] at [phone] or MyChart messaging. [x]   Complex Concerns: If your concern is more complex, scheduling an appointment might be best. Discuss this with the staff to find the most suitable option. [x]   Lab & Imaging Results: We'll contact you directly if results are abnormal or you don't use MyChart. Most normal results will be on MyChart within 2-3 business days, with a review message from Dr. Jon Billings. Haven't heard back in 2 weeks? Need results sooner? Contact us at [phone]. [x]   Referrals: Our referral coordinator will manage specialist referrals. The specialist's office should contact you within 2 weeks to schedule an appointment. Call us if you haven't heard from them after 2  weeks.  Staying Connected [x]   MyChart: Activate your MyChart for the fastest way to access results and message [name]. See the last page of this paperwork for instructions on how to activate.  Bring to Your Next Appointment [x]   Medications: Please bring all your medication bottles to your next appointment to ensure we have an accurate record of your prescriptions. [x]   Health Diaries: If you're monitoring any health conditions at home, keeping a diary of your readings can be very helpful for discussions at your next appointment.  Billing [x]   X-ray & Lab Orders: These are billed by separate companies. Contact the invoicing company directly for questions or concerns. [x]   Visit Charges: Discuss any billing inquiries with our administrative services team.  Your Satisfaction Matters [x]   Share Your Experience: We strive for your satisfaction! If you have any complaints, or preferably compliments, please let Dr. Jon Billings know directly or contact our Practice Administrators, Edwena Felty or [employer], by asking at the front desk.   Reviewing Your Records [x]   Review this early draft of your clinical encounter notes below and the final encounter summary tomorrow on MyChart after its been completed.  All orders placed so far are visible here: Type 2 diabetes mellitus with hyperglycemia, with long-term current use of insulin (HCC) Assessment & Plan: Type 2 Diabetes Mellitus A1c is currently at 8.4%, above the target of <7%. To improve glycemic control and reduce complications, increase tirzepatide to 5 mg weekly and send the prescription  to [facility] pharmacy. Educate on the use of CGM and advise reducing rice intake. Discussed potential insurance issues and the importance of adherence. Follow up in one month with blood glucose readings. Diabetic Retinopathy? Vision quality is variable, likely due to high blood sugar. Emphasize the importance of blood sugar control to prevent vision loss. Refer to  an ophthalmologist. Follow-up in one month with blood glucose readings and CGM data.  Orders: -     Tirzepatide; Inject 5 mg into the skin once a week.  Dispense: 6 mL; Refill: 3 -     Rosuvastatin Calcium; Take 1 tablet (10 mg total) by mouth daily.  Dispense: 90 tablet; Refill: 1  Microalbuminuria Assessment & Plan: Lab Results  Component Value Date/Time   GFR 100.57 12/09/2023 09:02 AM   GFR 102.43 09/09/2023 09:34 AM   GFR 103.96 06/09/2023 09:31 AM   GFR 108.05 08/12/2022 08:54 AM  Continue Marcelline Deist for kidney protection and glycemic control. No new issues.   Hypokalemia Assessment & Plan: Potassium levels are low (3.3-3.4). Continue potassium supplements and advise consuming potassium-rich foods.   Multiple myeloma in remission [facility]) Assessment & Plan: Multiple Myeloma Continue Revlimid as prescribed. No new issues. Continue acyclovir as prescribed by Dr. Candise Che. No new issues.   Idiopathic peripheral neuropathy Assessment & Plan: Continue gabapentin for nerve pain. No new issues.    Language barrier affecting health care  Hyperlipidemia, unspecified hyperlipidemia type -     Rosuvastatin Calcium; Take 1 tablet (10 mg total) by mouth daily.  Dispense: 90 tablet; Refill: 1

## 2024-01-20 NOTE — Assessment & Plan Note (Signed)
Potassium levels are low (3.3-3.4). Continue potassium supplements and advise consuming potassium-rich foods.

## 2024-01-20 NOTE — Assessment & Plan Note (Signed)
Lab Results  Component Value Date/Time   GFR 100.57 12/09/2023 09:02 AM   GFR 102.43 09/09/2023 09:34 AM   GFR 103.96 06/09/2023 09:31 AM   GFR 108.05 08/12/2022 08:54 AM  Continue Farxiga for kidney protection and glycemic control. No new issues.

## 2024-01-20 NOTE — Assessment & Plan Note (Signed)
Multiple Myeloma Continue Revlimid as prescribed. No new issues. Continue acyclovir as prescribed by Dr. Candise Che. No new issues.

## 2024-01-20 NOTE — Assessment & Plan Note (Signed)
Type 2 Diabetes Mellitus A1c is currently at 8.4%, above the target of <7%. To improve glycemic control and reduce complications, increase tirzepatide to 5 mg weekly and send the prescription to [facility] pharmacy. Educate on the use of CGM and advise reducing rice intake. Discussed potential insurance issues and the importance of adherence. Follow up in one month with blood glucose readings. Diabetic Retinopathy? Vision quality is variable, likely due to high blood sugar. Emphasize the importance of blood sugar control to prevent vision loss. Refer to an ophthalmologist. Follow-up in one month with blood glucose readings and CGM data.

## 2024-01-20 NOTE — Telephone Encounter (Signed)
Spoke with pharmacy, was informed his insurance covers the freestyle libre 3 sensors but not the reader. Discontinued both and ordered dexcom g7 sensors and receiver.

## 2024-01-20 NOTE — Assessment & Plan Note (Signed)
Continue gabapentin for nerve pain. No new issues.

## 2024-01-20 NOTE — Progress Notes (Signed)
==============================  [facility]: [id number]   -- Medical Office Visit --  Patient: Donald Suarez      Age: 58 y.o.       Sex:  male  Date:   01/20/2024 Today's Healthcare Provider: Lula Olszewski, MD  ==============================   CHIEF COMPLAINT: 1 month follow-up and Diabetes  SUBJECTIVE: Background This is a 58 y.o. male who has Multiple myeloma in remission (HCC); Counseling regarding advance care planning and goals of care; Hypoalbuminemia; Hyperviscosity; Type 2 diabetes mellitus with hyperglycemia (HCC); Chronic apical periodontitis; Hyperproteinemia; Blurry vision; Hypomagnesemia; Elevated ferritin; Microalbuminuria; Hypocalcemia; Peripheral neuropathy; Language barrier affecting health care; Hypokalemia; and History of dental problems on their problem list.  History of Present Illness The patient, with a history of diabetes and myeloma, presents for a follow-up visit. They have been on tirzepatide Greggory Keen) for diabetes management and report adherence to the medication. They have discontinued Ozempic as per previous instructions. The patient also reports taking Revlimid for myeloma, Farxiga for kidney protection, Aciclovir, B12, gabapentin for nerve pain, and Chlorcon for low potassium levels.  The patient has been experiencing fluctuations in their blood sugar levels, with an A1c of 8.4 in December. They also report intermittent visual disturbances, which they attribute to high blood pressure or blood sugar levels. The patient has not yet seen an eye doctor for these symptoms.  The patient has been prescribed antibiotics and cough medicine in the past week, but it is unclear why these were prescribed. They also report taking a lot of medications, but do not specify what these are.  The patient works and is unsure if they can wear the glucose meter at work, but they do have a glucose meter for finger sticks at home.   Reviewed  chart records that patient  has a past medical history of Abfraction (08/06/2022), Accretions on teeth (08/06/2022), Anemia, Attrition, teeth excessive (08/06/2022), Caries (08/06/2022), Defective dental restoration (08/06/2022), Elevated ferritin (08/07/2022), Hypocalcemia (08/19/2022), Loose, teeth (08/06/2022), Malocclusion (08/06/2022), Microalbuminuria (08/19/2022), Periodontal disease (08/06/2022), Peripheral neuropathy (09/08/2022), and Teeth missing (08/06/2022).  Discussed Past Medical History - Diabetes - Myeloma - Nerve pain - Low potassium - High blood pressure - Vision problems due to sugar    Problem list overviews that were updated at today's visit:No problems updated.  Today's Verbally Confirmed Medications - Tirzepatide (Mounjaro) pen injection 2.5 mg once a week - Antibiotics - Cough medicine - Revlimid - Farxiga - Acyclovir - Vitamin B12 - Gabapentin - Potassium Current Outpatient Medications on File Prior to Visit  Medication Sig   Accu-Chek Softclix Lancets lancets Use to test blood sugars up to 4 times daily as directed.   acyclovir (ZOVIRAX) 400 MG tablet Take 1 tablet (400 mg total) by mouth 2 (two) times daily.   aspirin EC 81 MG tablet Take 1 tablet (81 mg total) by mouth daily. Swallow whole.   benzonatate (TESSALON) 100 MG capsule Take 1 capsule (100 mg total) by mouth 2 (two) times daily as needed for cough.   Blood Glucose Monitoring Suppl (ACCU-CHEK GUIDE) w/Device KIT Use to test blood sugars up to 4 times daily as directed.   Cyanocobalamin (B-12) 1000 MCG CAPS Take 1 tablet by mouth daily.   dapagliflozin propanediol (FARXIGA) 10 MG TABS tablet Take 1 tablet (10 mg total) by mouth daily before breakfast.   doxycycline (VIBRA-TABS) 100 MG tablet Take 1 tablet (100 mg total) by mouth 2 (two) times daily for 10 days.   gabapentin (NEURONTIN) 300  MG capsule Take 1 capsule (300 mg total) by mouth 3 (three) times daily.   glucose blood (ACCU-CHEK GUIDE)  test strip Use to test blood sugars up to 4 times daily as directed.   KLOR-CON M20 20 MEQ tablet TAKE 1 TABLET BY MOUTH EVERY DAY   lenalidomide (REVLIMID) 25 MG capsule TAKE 1 CAPSULE BY MOUTH 1 TIME A DAY FOR 21 DAYS ON THEN 7 DAYS OFF   metFORMIN (GLUCOPHAGE) 500 MG tablet Take 2 tablets (1,000 mg total) by mouth 2 (two) times daily with a meal. Note dose increased. Replaces 500 mg twice daily.   ondansetron (ZOFRAN) 8 MG tablet Take 1 tablet (8 mg total) by mouth every 8 (eight) hours as needed for nausea or vomiting.   prochlorperazine (COMPAZINE) 10 MG tablet Take 1 tablet (10 mg total) by mouth every 6 (six) hours as needed for nausea or vomiting.   Current Facility-Administered Medications on File Prior to Visit  Medication   clotrimazole (LOTRIMIN) 1 % cream   Medications Discontinued During This Encounter  Medication Reason   Semaglutide,0.25 or 0.5MG /DOS, (OZEMPIC, 0.25 OR 0.5 MG/DOSE,) 2 MG/3ML SOPN Not covered by the pt's insurance   tirzepatide [facility]) 2.5 MG/0.5ML Pen Dose change      Objective   Physical Exam     01/20/2024    8:47 AM 01/15/2024    4:08 PM 01/15/2024    1:43 PM  Vitals with BMI  Height 5\' 6"   5\' 6"   Weight 177 lbs 6 oz  172 lbs 11 oz  BMI 28.65  27.89  Systolic 138 122 161  Diastolic 88 79 84  Pulse 64 85 85   Wt Readings from Last 10 Encounters:  01/20/24 177 lb 6.4 oz (80.5 kg)  01/15/24 172 lb 11.2 oz (78.3 kg)  12/18/23 179 lb 8 oz (81.4 kg)  12/09/23 181 lb (82.1 kg)  11/20/23 179 lb 12.8 oz (81.6 kg)  10/23/23 177 lb 8 oz (80.5 kg)  09/25/23 171 lb 3.2 oz (77.7 kg)  09/09/23 168 lb 9.6 oz (76.5 kg)  08/28/23 170 lb 3.2 oz (77.2 kg)  07/31/23 177 lb 6.4 oz (80.5 kg)   Vital signs reviewed.  Nursing notes reviewed. Weight trend reviewed. Language barrier/ usual translator N/A and no interpreter speaks his rare dialect. General Appearance:  No acute distress appreciable.   Well-groomed, healthy-appearing male.  Well proportioned with  no abnormal fat distribution.  Good muscle tone. Pulmonary:  Normal work of breathing at rest, no respiratory distress apparent. SpO2: 99 %  Musculoskeletal: All extremities are intact.  Neurological:  Awake, alert, oriented, and engaged.  No obvious focal neurological deficits or cognitive impairments.  Sensorium seems unclouded.   Speech is clear and coherent with logical content. Psychiatric:  Appropriate mood, pleasant and cooperative demeanor, thoughtful and engaged during the exam    No results found for any visits on 01/20/24. Appointment on 01/15/2024  Component Date Value   WBC Count 01/15/2024 11.5 (H)    RBC 01/15/2024 5.67    Hemoglobin 01/15/2024 15.0    HCT 01/15/2024 45.3    MCV 01/15/2024 79.9 (L)    MCH 01/15/2024 26.5    MCHC 01/15/2024 33.1    RDW 01/15/2024 16.2 (H)    Platelet Count 01/15/2024 172    nRBC 01/15/2024 0.0    Neutrophils Relative % 01/15/2024 54    Neutro Abs 01/15/2024 6.3    Lymphocytes Relative 01/15/2024 31    Lymphs Abs 01/15/2024 3.5    Monocytes  Relative 01/15/2024 13    Monocytes Absolute 01/15/2024 1.5 (H)    Eosinophils Relative 01/15/2024 1    Eosinophils Absolute 01/15/2024 0.1    Basophils Relative 01/15/2024 1    Basophils Absolute 01/15/2024 0.1    Immature Granulocytes 01/15/2024 0    Abs Immature Granulocytes 01/15/2024 0.04    Sodium 01/15/2024 137    Potassium 01/15/2024 3.4 (L)    Chloride 01/15/2024 104    CO2 01/15/2024 24    Glucose, Bld 01/15/2024 191 (H)    BUN 01/15/2024 13    Creatinine, Ser 01/15/2024 0.79    Calcium 01/15/2024 9.5    Total Protein 01/15/2024 7.1    Albumin 01/15/2024 4.1    AST 01/15/2024 13 (L)    ALT 01/15/2024 17    Alkaline Phosphatase 01/15/2024 70    Total Bilirubin 01/15/2024 0.6    GFR, Estimated 01/15/2024 >60    Anion gap 01/15/2024 9   Appointment on 12/18/2023  Component Date Value   WBC Count 12/18/2023 10.4    RBC 12/18/2023 5.55    Hemoglobin 12/18/2023 14.4    HCT  12/18/2023 42.6    MCV 12/18/2023 76.8 (L)    MCH 12/18/2023 25.9 (L)    MCHC 12/18/2023 33.8    RDW 12/18/2023 15.7 (H)    Platelet Count 12/18/2023 384    nRBC 12/18/2023 0.0    Neutrophils Relative % 12/18/2023 73    Neutro Abs 12/18/2023 7.6    Lymphocytes Relative 12/18/2023 19    Lymphs Abs 12/18/2023 2.0    Monocytes Relative 12/18/2023 7    Monocytes Absolute 12/18/2023 0.7    Eosinophils Relative 12/18/2023 0    Eosinophils Absolute 12/18/2023 0.0    Basophils Relative 12/18/2023 0    Basophils Absolute 12/18/2023 0.0    Immature Granulocytes 12/18/2023 1    Abs Immature Granulocytes 12/18/2023 0.10 (H)    Sodium 12/18/2023 138    Potassium 12/18/2023 3.4 (L)    Chloride 12/18/2023 103    CO2 12/18/2023 25    Glucose, Bld 12/18/2023 234 (H)    BUN 12/18/2023 9    Creatinine, Ser 12/18/2023 0.69    Calcium 12/18/2023 9.0    Total Protein 12/18/2023 6.7    Albumin 12/18/2023 3.7    AST 12/18/2023 30    ALT 12/18/2023 39    Alkaline Phosphatase 12/18/2023 100    Total Bilirubin 12/18/2023 0.5    GFR, Estimated 12/18/2023 >60    Anion gap 12/18/2023 10    IgG (Immunoglobin G), Se* 12/18/2023 584 (L)    IgA 12/18/2023 106    IgM (Immunoglobulin M), * 12/18/2023 19 (L)    Total Protein ELP 12/18/2023 6.0    Albumin SerPl Elph-Mcnc 12/18/2023 2.8 (L)    Alpha 1 12/18/2023 0.4    Alpha2 Glob SerPl Elph-M* 12/18/2023 1.5 (H)    B-Globulin SerPl Elph-Mc* 12/18/2023 0.9    Gamma Glob SerPl Elph-Mc* 12/18/2023 0.4    M Protein SerPl Elph-Mcnc 12/18/2023 0.2 (H)    Globulin, Total 12/18/2023 3.2    Albumin/Glob SerPl 12/18/2023 0.9    IFE 1 12/18/2023 Comment (A)    Please Note 12/18/2023 Comment   Lab on 12/09/2023  Component Date Value   Hgb A1c MFr Bld 12/09/2023 8.4 (H)    Mean Plasma Glucose 12/09/2023 194    eAG (mmol/L) 12/09/2023 10.8   Office Visit on 12/09/2023  Component Date Value   Ferritin 12/09/2023 211.8    WBC 12/09/2023 7.7    RBC  12/09/2023  5.92 (H)    Hemoglobin 12/09/2023 15.6    HCT 12/09/2023 47.9    MCV 12/09/2023 81.0    MCHC 12/09/2023 32.5    RDW 12/09/2023 18.2 (H)    Platelets 12/09/2023 149.0 (L)    Neutrophils Relative % 12/09/2023 60.1    Lymphocytes Relative 12/09/2023 22.5    Monocytes Relative 12/09/2023 14.2 (H)    Eosinophils Relative 12/09/2023 2.1    Basophils Relative 12/09/2023 1.1    Neutro Abs 12/09/2023 4.6    Lymphs Abs 12/09/2023 1.7    Monocytes Absolute 12/09/2023 1.1 (H)    Eosinophils Absolute 12/09/2023 0.2    Basophils Absolute 12/09/2023 0.1    Sodium 12/09/2023 135    Potassium 12/09/2023 3.3 (L)    Chloride 12/09/2023 102    CO2 12/09/2023 22    Glucose, Bld 12/09/2023 195 (H)    BUN 12/09/2023 12    Creatinine, Ser 12/09/2023 0.75    Total Bilirubin 12/09/2023 0.7    Alkaline Phosphatase 12/09/2023 61    AST 12/09/2023 32    ALT 12/09/2023 26    Total Protein 12/09/2023 6.9    Albumin 12/09/2023 4.3    GFR 12/09/2023 100.57    Calcium 12/09/2023 8.4    Cholesterol 12/09/2023 186    Triglycerides 12/09/2023 90.0    HDL 12/09/2023 48.60    VLDL 12/09/2023 18.0    LDL Cholesterol 12/09/2023 120 (H)    Total CHOL/HDL Ratio 12/09/2023 4    NonHDL 12/09/2023 137.51    Microalb, Ur 12/09/2023 12.2 (H)    Creatinine,U 12/09/2023 133.2    Microalb Creat Ratio 12/09/2023 9.1   Appointment on 11/20/2023  Component Date Value   WBC Count 11/20/2023 7.1    RBC 11/20/2023 5.78    Hemoglobin 11/20/2023 14.9    HCT 11/20/2023 45.7    MCV 11/20/2023 79.1 (L)    MCH 11/20/2023 25.8 (L)    MCHC 11/20/2023 32.6    RDW 11/20/2023 18.7 (H)    Platelet Count 11/20/2023 131 (L)    nRBC 11/20/2023 0.0    Neutrophils Relative % 11/20/2023 40    Neutro Abs 11/20/2023 2.8    Lymphocytes Relative 11/20/2023 40    Lymphs Abs 11/20/2023 2.9    Monocytes Relative 11/20/2023 10    Monocytes Absolute 11/20/2023 0.7    Eosinophils Relative 11/20/2023 8    Eosinophils Absolute 11/20/2023 0.6  (H)    Basophils Relative 11/20/2023 2    Basophils Absolute 11/20/2023 0.1    Immature Granulocytes 11/20/2023 0    Abs Immature Granulocytes 11/20/2023 0.02    Sodium 11/20/2023 139    Potassium 11/20/2023 3.7    Chloride 11/20/2023 107    CO2 11/20/2023 26    Glucose, Bld 11/20/2023 266 (H)    BUN 11/20/2023 11    Creatinine, Ser 11/20/2023 0.77    Calcium 11/20/2023 9.0    Total Protein 11/20/2023 6.5    Albumin 11/20/2023 4.1    AST 11/20/2023 14 (L)    ALT 11/20/2023 19    Alkaline Phosphatase 11/20/2023 56    Total Bilirubin 11/20/2023 0.6    GFR, Estimated 11/20/2023 >60    Anion gap 11/20/2023 6    IgG (Immunoglobin G), Se* 11/20/2023 698    IgA 11/20/2023 150    IgM (Immunoglobulin M), * 11/20/2023 30    Total Protein ELP 11/20/2023 6.0    Albumin SerPl Elph-Mcnc 11/20/2023 3.6    Alpha 1 11/20/2023 0.3    Alpha2 Glob SerPl  Elph-M* 11/20/2023 0.6    B-Globulin SerPl Elph-Mc* 11/20/2023 0.9    Gamma Glob SerPl Elph-Mc* 11/20/2023 0.6    M Protein SerPl Elph-Mcnc 11/20/2023 0.2 (H)    Globulin, Total 11/20/2023 2.4    Albumin/Glob SerPl 11/20/2023 1.6    IFE 1 11/20/2023 Comment (A)    Please Note 11/20/2023 Comment   Appointment on 10/23/2023  Component Date Value   WBC Count 10/23/2023 9.2    RBC 10/23/2023 5.64    Hemoglobin 10/23/2023 14.3    HCT 10/23/2023 43.5    MCV 10/23/2023 77.1 (L)    MCH 10/23/2023 25.4 (L)    MCHC 10/23/2023 32.9    RDW 10/23/2023 19.2 (H)    Platelet Count 10/23/2023 218    nRBC 10/23/2023 0.0    Neutrophils Relative % 10/23/2023 49    Neutro Abs 10/23/2023 4.4    Lymphocytes Relative 10/23/2023 33    Lymphs Abs 10/23/2023 3.1    Monocytes Relative 10/23/2023 9    Monocytes Absolute 10/23/2023 0.9    Eosinophils Relative 10/23/2023 7    Eosinophils Absolute 10/23/2023 0.7 (H)    Basophils Relative 10/23/2023 2    Basophils Absolute 10/23/2023 0.1    Immature Granulocytes 10/23/2023 0    Abs Immature Granulocytes  10/23/2023 0.03    Sodium 10/23/2023 139    Potassium 10/23/2023 3.4 (L)    Chloride 10/23/2023 108    CO2 10/23/2023 26    Glucose, Bld 10/23/2023 230 (H)    BUN 10/23/2023 13    Creatinine, Ser 10/23/2023 0.76    Calcium 10/23/2023 9.0    Total Protein 10/23/2023 6.6    Albumin 10/23/2023 4.1    AST 10/23/2023 13 (L)    ALT 10/23/2023 20    Alkaline Phosphatase 10/23/2023 62    Total Bilirubin 10/23/2023 0.9    GFR, Estimated 10/23/2023 >60    Anion gap 10/23/2023 5    IgG (Immunoglobin G), Se* 10/23/2023 689    IgA 10/23/2023 145    IgM (Immunoglobulin M), * 10/23/2023 26    Total Protein ELP 10/23/2023 6.1    Albumin SerPl Elph-Mcnc 10/23/2023 3.6    Alpha 1 10/23/2023 0.3    Alpha2 Glob SerPl Elph-M* 10/23/2023 0.7    B-Globulin SerPl Elph-Mc* 10/23/2023 0.9    Gamma Glob SerPl Elph-Mc* 10/23/2023 0.6    M Protein SerPl Elph-Mcnc 10/23/2023 0.2 (H)    Globulin, Total 10/23/2023 2.5    Albumin/Glob SerPl 10/23/2023 1.5    IFE 1 10/23/2023 Comment (A)    Please Note 10/23/2023 Comment   Appointment on 09/25/2023  Component Date Value   WBC Count 09/25/2023 8.2    RBC 09/25/2023 5.53    Hemoglobin 09/25/2023 13.6    HCT 09/25/2023 42.1    MCV 09/25/2023 76.1 (L)    MCH 09/25/2023 24.6 (L)    MCHC 09/25/2023 32.3    RDW 09/25/2023 17.6 (H)    Platelet Count 09/25/2023 226    nRBC 09/25/2023 0.0    Neutrophils Relative % 09/25/2023 45    Neutro Abs 09/25/2023 3.7    Lymphocytes Relative 09/25/2023 40    Lymphs Abs 09/25/2023 3.3    Monocytes Relative 09/25/2023 11    Monocytes Absolute 09/25/2023 0.9    Eosinophils Relative 09/25/2023 3    Eosinophils Absolute 09/25/2023 0.2    Basophils Relative 09/25/2023 1    Basophils Absolute 09/25/2023 0.1    Immature Granulocytes 09/25/2023 0    Abs Immature Granulocytes 09/25/2023 0.02  Sodium 09/25/2023 137    Potassium 09/25/2023 3.5    Chloride 09/25/2023 105    CO2 09/25/2023 26    Glucose, Bld 09/25/2023 159  (H)    BUN 09/25/2023 12    Creatinine, Ser 09/25/2023 0.78    Calcium 09/25/2023 9.0    Total Protein 09/25/2023 6.8    Albumin 09/25/2023 3.9    AST 09/25/2023 15    ALT 09/25/2023 21    Alkaline Phosphatase 09/25/2023 67    Total Bilirubin 09/25/2023 0.5    GFR, Estimated 09/25/2023 >60    Anion gap 09/25/2023 6    IgG (Immunoglobin G), Se* 09/25/2023 659    IgA 09/25/2023 160    IgM (Immunoglobulin M), * 09/25/2023 30    Total Protein ELP 09/25/2023 6.3    Albumin SerPl Elph-Mcnc 09/25/2023 3.5    Alpha 1 09/25/2023 0.1    Alpha2 Glob SerPl Elph-M* 09/25/2023 1.2 (H)    B-Globulin SerPl Elph-Mc* 09/25/2023 1.0    Gamma Glob SerPl Elph-Mc* 09/25/2023 0.5    M Protein SerPl Elph-Mcnc 09/25/2023 0.2 (H)    Globulin, Total 09/25/2023 2.8    Albumin/Glob SerPl 09/25/2023 1.3    IFE 1 09/25/2023 Comment (A)    Please Note 09/25/2023 Comment   Lab on 09/09/2023  Component Date Value   Hgb A1c MFr Bld 09/09/2023 8.9 (H)    Mean Plasma Glucose 09/09/2023 209    eAG (mmol/L) 09/09/2023 11.6   Office Visit on 09/09/2023  Component Date Value   WBC 09/09/2023 8.9    RBC 09/09/2023 5.28    Hemoglobin 09/09/2023 13.0    HCT 09/09/2023 41.2    MCV 09/09/2023 78.0    MCHC 09/09/2023 31.5    RDW 09/09/2023 16.8 (H)    Platelets 09/09/2023 280.0    Neutrophils Relative % 09/09/2023 52.5    Lymphocytes Relative 09/09/2023 38.1    Monocytes Relative 09/09/2023 7.1    Eosinophils Relative 09/09/2023 1.5    Basophils Relative 09/09/2023 0.8    Neutro Abs 09/09/2023 4.7    Lymphs Abs 09/09/2023 3.4    Monocytes Absolute 09/09/2023 0.6    Eosinophils Absolute 09/09/2023 0.1    Basophils Absolute 09/09/2023 0.1    Sodium 09/09/2023 136    Potassium 09/09/2023 4.1    Chloride 09/09/2023 98    CO2 09/09/2023 29    Glucose, Bld 09/09/2023 155 (H)    BUN 09/09/2023 9    Creatinine, Ser 09/09/2023 0.71    Total Bilirubin 09/09/2023 0.5    Alkaline Phosphatase 09/09/2023 85    AST  09/09/2023 18    ALT 09/09/2023 34    Total Protein 09/09/2023 7.4    Albumin 09/09/2023 3.9    GFR 09/09/2023 102.43    Calcium 09/09/2023 9.8    Cholesterol 09/09/2023 163    Triglycerides 09/09/2023 88.0    HDL 09/09/2023 33.70 (L)    VLDL 09/09/2023 17.6    LDL Cholesterol 09/09/2023 112 (H)    Total CHOL/HDL Ratio 09/09/2023 5    NonHDL 09/09/2023 129.77    Microalb, Ur 09/09/2023 1.2    Creatinine,U 09/09/2023 118.8    Microalb Creat Ratio 09/09/2023 1.0   Appointment on 08/28/2023  Component Date Value   WBC Count 08/28/2023 9.9    RBC 08/28/2023 5.17    Hemoglobin 08/28/2023 13.1    HCT 08/28/2023 39.4    MCV 08/28/2023 76.2 (L)    MCH 08/28/2023 25.3 (L)    MCHC 08/28/2023 33.2    RDW 08/28/2023  15.1    Platelet Count 08/28/2023 252    nRBC 08/28/2023 0.0    Neutrophils Relative % 08/28/2023 58    Neutro Abs 08/28/2023 5.9    Lymphocytes Relative 08/28/2023 25    Lymphs Abs 08/28/2023 2.5    Monocytes Relative 08/28/2023 13    Monocytes Absolute 08/28/2023 1.2 (H)    Eosinophils Relative 08/28/2023 1    Eosinophils Absolute 08/28/2023 0.1    Basophils Relative 08/28/2023 2    Basophils Absolute 08/28/2023 0.2 (H)    Immature Granulocytes 08/28/2023 1    Abs Immature Granulocytes 08/28/2023 0.05    Sodium 08/28/2023 135    Potassium 08/28/2023 3.5    Chloride 08/28/2023 100    CO2 08/28/2023 25    Glucose, Bld 08/28/2023 317 (H)    BUN 08/28/2023 10    Creatinine, Ser 08/28/2023 0.71    Calcium 08/28/2023 8.8 (L)    Total Protein 08/28/2023 7.2    Albumin 08/28/2023 3.7    AST 08/28/2023 14 (L)    ALT 08/28/2023 40    Alkaline Phosphatase 08/28/2023 111    Total Bilirubin 08/28/2023 0.6    GFR, Estimated 08/28/2023 >60    Anion gap 08/28/2023 10    IgG (Immunoglobin G), Se* 08/28/2023 675    IgA 08/28/2023 204    IgM (Immunoglobulin M), * 08/28/2023 31    Total Protein ELP 08/28/2023 6.4    Albumin SerPl Elph-Mcnc 08/28/2023 3.0    Alpha 1  08/28/2023 0.2    Alpha2 Glob SerPl Elph-M* 08/28/2023 1.6 (H)    B-Globulin SerPl Elph-Mc* 08/28/2023 1.1    Gamma Glob SerPl Elph-Mc* 08/28/2023 0.5    M Protein SerPl Elph-Mcnc 08/28/2023 0.2 (H)    Globulin, Total 08/28/2023 3.4    Albumin/Glob SerPl 08/28/2023 0.9    IFE 1 08/28/2023 Comment (A)    Please Note 08/28/2023 Comment   There may be more visits with results that are not included.  No image results found. No results found.NM PET Image Restage (PS) Whole Body Result Date: 02/23/2023 CLINICAL DATA:  Subsequent treatment strategy for multiple myeloma. EXAM: NUCLEAR MEDICINE PET WHOLE BODY TECHNIQUE: 8.5 mCi F-18 FDG was injected intravenously. Full-ring PET imaging was performed from the head to foot after the radiotracer. CT data was obtained and used for attenuation correction and anatomic localization. Fasting blood glucose: 200 mg/dl COMPARISON:  16/09/9603. FINDINGS: Mediastinal blood pool activity: SUV max 2.3 HEAD/NECK: No abnormal hypermetabolism. Incidental CT findings: None. CHEST: No abnormal hypermetabolism. Incidental CT findings: Heart is enlarged.  No pericardial or pleural effusion. ABDOMEN/PELVIS: No abnormal hypermetabolism. Incidental CT findings: Liver is unremarkable. Stones in the gallbladder. Adrenal glands, kidneys, spleen, pancreas, stomach and bowel are grossly unremarkable. Atherosclerotic calcification of the aorta. SKELETON: Persistent focal uptake in the anterior left second rib corresponds to an old, possibly pathologic, fracture, SUV max 2.5, stable. No new areas of abnormal hypermetabolism. Otherwise, mild patchy uptake in the spine without focality. No new hypermetabolic lesions. Incidental CT findings: Extensive lytic disease throughout the visualized osseous structures, as before. EXTREMITIES: No focal abnormal hypermetabolism. Incidental CT findings: None. IMPRESSION: 1. Persistent mild uptake associated with the anterior left second rib, possibly due to  an old pathologic fracture. Mild patchy metabolism throughout the spine is likely treatment related. Extensive lytic disease throughout the visualized osseous structures without new focal areas of abnormal hypermetabolism to suggest metabolically active multiple myeloma. 2. Cholelithiasis. 3.  Aortic atherosclerosis (ICD10-I70.0). Electronically Signed   By: Leanna Battles M.D.  On: 02/23/2023 08:18       Assessment & Plan Type 2 diabetes mellitus with hyperglycemia, with long-term current use of insulin (HCC) Type 2 Diabetes Mellitus A1c is currently at 8.4%, above the target of <7%. To improve glycemic control and reduce complications, increase tirzepatide to 5 mg weekly and send the prescription to [facility] pharmacy. Educate on the use of CGM and advise reducing rice intake. Discussed potential insurance issues and the importance of adherence. Follow up in one month with blood glucose readings. Diabetic Retinopathy? Vision quality is variable, likely due to high blood sugar. Emphasize the importance of blood sugar control to prevent vision loss. Refer to an ophthalmologist. Follow-up in one month with blood glucose readings and CGM data. Microalbuminuria Lab Results  Component Value Date/Time   GFR 100.57 12/09/2023 09:02 AM   GFR 102.43 09/09/2023 09:34 AM   GFR 103.96 06/09/2023 09:31 AM   GFR 108.05 08/12/2022 08:54 AM  Continue Marcelline Deist for kidney protection and glycemic control. No new issues. Hypokalemia Potassium levels are low (3.3-3.4). Continue potassium supplements and advise consuming potassium-rich foods. Multiple myeloma in remission (HCC) Multiple Myeloma Continue Revlimid as prescribed. No new issues. Continue acyclovir as prescribed by Dr. Candise Che. No new issues. Idiopathic peripheral neuropathy Continue gabapentin for nerve pain. No new issues.  Language barrier affecting health care  Hyperlipidemia, unspecified hyperlipidemia type Medications: not yet  discussed Lab Results  Component Value Date   HDL 48.60 12/09/2023   HDL 33.70 (L) 09/09/2023   HDL 44.20 06/09/2023   CHOLHDL 4 12/09/2023   CHOLHDL 5 09/09/2023   CHOLHDL 4 06/09/2023   Lab Results  Component Value Date   LDLCALC 120 (H) 12/09/2023   LDLCALC 112 (H) 09/09/2023   LDLCALC 129 (H) 06/09/2023   Lab Results  Component Value Date   TRIG 90.0 12/09/2023   TRIG 88.0 09/09/2023   TRIG 111.0 06/09/2023   Lab Results  Component Value Date   CHOL 186 12/09/2023   CHOL 163 09/09/2023   CHOL 195 06/09/2023   The 10-year ASCVD risk score (Arnett DK, et al., 2019) is: 13.5%   Values used to calculate the score:     Age: 53 years     Sex: Male     Is Non-Hispanic African American: No     Diabetic: Yes     Tobacco smoker: No     Systolic Blood Pressure: 138 mmHg     Is BP treated: No     HDL Cholesterol: 48.6 mg/dL     Total Cholesterol: 186 mg/dL Lab Results  Component Value Date   ALT 17 01/15/2024   AST 13 (L) 01/15/2024   ALKPHOS 70 01/15/2024   HGBA1C 8.4 (H) 12/09/2023   Body mass index is 28.63 kg/m.  Lipoprotein(a), Apolipoprotein B (ApoB), and High-sensitivity C-reactive protein (hs-CRP) No results found for: "HSCRP", "LIPOA" Improving Your Cholesterol: Diet: Focus on a Mediterranean-style diet, limit saturated fats and sugars, and increase omega-3 fatty acids (fish, flaxseeds,nuts,extra virgin olive oil, avocados). Exercise: Engage in regular physical activity (aerobic exercises are particularly beneficial for HDL). Weight Management: Maintain a healthy weight.        Orders Placed During this Encounter:  No orders of the defined types were placed in this encounter.  Meds ordered this encounter  Medications   tirzepatide (MOUNJARO) 5 MG/0.5ML Pen    Sig: Inject 5 mg into the skin once a week.    Dispense:  6 mL    Refill:  3   DISCONTD:  Continuous Glucose Sensor (FREESTYLE LIBRE 3 SENSOR) MISC    Sig: 1 Act by Does not apply route daily.  Place 1 sensor on the skin every 14 days. Use to check glucose continuously    Dispense:  2 each    Refill:  5   DISCONTD: Continuous Glucose Receiver (FREESTYLE LIBRE 3 READER) DEVI    Sig: 1 Act by Does not apply route daily.    Dispense:  1 each    Refill:  3   rosuvastatin (CRESTOR) 10 MG tablet    Sig: Take 1 tablet (10 mg total) by mouth daily.    Dispense:  90 tablet    Refill:  1   Medical Decision Making: 1 or more chronic illnesses with exacerbation,  progression, or side effects of treatment 2 or more stable chronic illnesses Prescription drug management  Diagnosis or treatment significantly limited by social determinants of health      This document was synthesized by artificial intelligence (Abridge) using HIPAA-compliant recording of the clinical interaction;   We discussed the use of AI scribe software for clinical note transcription with the patient, who gave verbal consent to proceed.    Additional Info: This encounter employed state-of-the-art, real-time, collaborative documentation. The patient actively reviewed and assisted in updating their electronic medical record on a shared screen, ensuring transparency and facilitating joint problem-solving for the problem list, overview, and plan. This approach promotes accurate, informed care. The treatment plan was discussed and reviewed in detail, including medication safety, potential side effects, and all patient questions. We confirmed understanding and comfort with the plan. Follow-up instructions were established, including contacting the office for any concerns, returning if symptoms worsen, persist, or new symptoms develop, and precautions for potential emergency department visits.

## 2024-01-21 ENCOUNTER — Encounter: Payer: Self-pay | Admitting: Hematology

## 2024-01-22 LAB — MULTIPLE MYELOMA PANEL, SERUM
Albumin SerPl Elph-Mcnc: 3.3 g/dL (ref 2.9–4.4)
Albumin/Glob SerPl: 1.1 (ref 0.7–1.7)
Alpha 1: 0.3 g/dL (ref 0.0–0.4)
Alpha2 Glob SerPl Elph-Mcnc: 1.2 g/dL — ABNORMAL HIGH (ref 0.4–1.0)
B-Globulin SerPl Elph-Mcnc: 1 g/dL (ref 0.7–1.3)
Gamma Glob SerPl Elph-Mcnc: 0.7 g/dL (ref 0.4–1.8)
Globulin, Total: 3.2 g/dL (ref 2.2–3.9)
IgA: 119 mg/dL (ref 90–386)
IgG (Immunoglobin G), Serum: 784 mg/dL (ref 603–1613)
IgM (Immunoglobulin M), Srm: 25 mg/dL (ref 20–172)
M Protein SerPl Elph-Mcnc: 0.3 g/dL — ABNORMAL HIGH
Total Protein ELP: 6.5 g/dL (ref 6.0–8.5)

## 2024-01-31 ENCOUNTER — Other Ambulatory Visit: Payer: Self-pay

## 2024-02-03 ENCOUNTER — Other Ambulatory Visit: Payer: Self-pay | Admitting: Hematology

## 2024-02-03 DIAGNOSIS — C9000 Multiple myeloma not having achieved remission: Secondary | ICD-10-CM

## 2024-02-12 ENCOUNTER — Inpatient Hospital Stay: Payer: 59 | Admitting: Hematology

## 2024-02-12 ENCOUNTER — Inpatient Hospital Stay: Payer: 59

## 2024-02-12 ENCOUNTER — Inpatient Hospital Stay: Payer: 59 | Attending: Hematology

## 2024-02-12 ENCOUNTER — Other Ambulatory Visit: Payer: Self-pay

## 2024-02-12 VITALS — BP 137/74 | HR 72 | Temp 97.6°F | Resp 16

## 2024-02-12 VITALS — BP 138/83 | HR 82 | Temp 97.3°F | Resp 16 | Wt 170.9 lb

## 2024-02-12 DIAGNOSIS — E1142 Type 2 diabetes mellitus with diabetic polyneuropathy: Secondary | ICD-10-CM | POA: Insufficient documentation

## 2024-02-12 DIAGNOSIS — Z7189 Other specified counseling: Secondary | ICD-10-CM

## 2024-02-12 DIAGNOSIS — E876 Hypokalemia: Secondary | ICD-10-CM | POA: Insufficient documentation

## 2024-02-12 DIAGNOSIS — C9001 Multiple myeloma in remission: Secondary | ICD-10-CM | POA: Diagnosis not present

## 2024-02-12 DIAGNOSIS — Z7984 Long term (current) use of oral hypoglycemic drugs: Secondary | ICD-10-CM | POA: Diagnosis not present

## 2024-02-12 DIAGNOSIS — N289 Disorder of kidney and ureter, unspecified: Secondary | ICD-10-CM | POA: Insufficient documentation

## 2024-02-12 DIAGNOSIS — C9000 Multiple myeloma not having achieved remission: Secondary | ICD-10-CM | POA: Insufficient documentation

## 2024-02-12 DIAGNOSIS — R058 Other specified cough: Secondary | ICD-10-CM | POA: Insufficient documentation

## 2024-02-12 DIAGNOSIS — Z7985 Long-term (current) use of injectable non-insulin antidiabetic drugs: Secondary | ICD-10-CM | POA: Insufficient documentation

## 2024-02-12 DIAGNOSIS — D649 Anemia, unspecified: Secondary | ICD-10-CM | POA: Diagnosis not present

## 2024-02-12 DIAGNOSIS — Z5112 Encounter for antineoplastic immunotherapy: Secondary | ICD-10-CM | POA: Insufficient documentation

## 2024-02-12 LAB — CMP (CANCER CENTER ONLY)
ALT: 12 U/L (ref 0–44)
AST: 11 U/L — ABNORMAL LOW (ref 15–41)
Albumin: 4.2 g/dL (ref 3.5–5.0)
Alkaline Phosphatase: 50 U/L (ref 38–126)
Anion gap: 8 (ref 5–15)
BUN: 11 mg/dL (ref 6–20)
CO2: 28 mmol/L (ref 22–32)
Calcium: 9.1 mg/dL (ref 8.9–10.3)
Chloride: 102 mmol/L (ref 98–111)
Creatinine: 0.85 mg/dL (ref 0.61–1.24)
GFR, Estimated: >60 mL/min (ref >60)
Glucose, Bld: 211 mg/dL — ABNORMAL HIGH (ref 70–99)
Potassium: 2.9 mmol/L — ABNORMAL LOW (ref 3.5–5.1)
Sodium: 138 mmol/L (ref 135–145)
Total Bilirubin: 0.7 mg/dL (ref 0.0–1.2)
Total Protein: 6.6 g/dL (ref 6.5–8.1)

## 2024-02-12 LAB — CBC WITH DIFFERENTIAL (CANCER CENTER ONLY)
Abs Immature Granulocytes: 0.02 10*3/uL (ref 0.00–0.07)
Basophils Absolute: 0.1 10*3/uL (ref 0.0–0.1)
Basophils Relative: 1 %
Eosinophils Absolute: 0.5 10*3/uL (ref 0.0–0.5)
Eosinophils Relative: 8 %
HCT: 45.6 % (ref 39.0–52.0)
Hemoglobin: 15.3 g/dL (ref 13.0–17.0)
Immature Granulocytes: 0 %
Lymphocytes Relative: 42 %
Lymphs Abs: 3 10*3/uL (ref 0.7–4.0)
MCH: 26.3 pg (ref 26.0–34.0)
MCHC: 33.6 g/dL (ref 30.0–36.0)
MCV: 78.5 fL — ABNORMAL LOW (ref 80.0–100.0)
Monocytes Absolute: 0.5 10*3/uL (ref 0.1–1.0)
Monocytes Relative: 8 %
Neutro Abs: 2.8 10*3/uL (ref 1.7–7.7)
Neutrophils Relative %: 41 %
Platelet Count: 126 10*3/uL — ABNORMAL LOW (ref 150–400)
RBC: 5.81 MIL/uL (ref 4.22–5.81)
RDW: 15.4 % (ref 11.5–15.5)
WBC Count: 7 10*3/uL (ref 4.0–10.5)
nRBC: 0 % (ref 0.0–0.2)

## 2024-02-12 MED ORDER — DEXAMETHASONE SODIUM PHOSPHATE 10 MG/ML IJ SOLN
2.0000 mg | Freq: Once | INTRAMUSCULAR | Status: AC
  Administered 2024-02-12: 2 mg via INTRAVENOUS
  Filled 2024-02-12: qty 1

## 2024-02-12 MED ORDER — SODIUM CHLORIDE 0.9 % IV SOLN
16.0000 mg/kg | Freq: Once | INTRAVENOUS | Status: AC
  Administered 2024-02-12: 1300 mg via INTRAVENOUS
  Filled 2024-02-12: qty 5

## 2024-02-12 MED ORDER — POTASSIUM CHLORIDE CRYS ER 20 MEQ PO TBCR
40.0000 meq | EXTENDED_RELEASE_TABLET | Freq: Once | ORAL | Status: AC
Start: 2024-02-12 — End: 2024-02-12
  Administered 2024-02-12: 40 meq via ORAL
  Filled 2024-02-12: qty 2

## 2024-02-12 MED ORDER — SODIUM CHLORIDE 0.9 % IV SOLN: Freq: Once | INTRAVENOUS | Status: AC

## 2024-02-12 MED ORDER — POTASSIUM CHLORIDE CRYS ER 20 MEQ PO TBCR: 20.0000 meq | EXTENDED_RELEASE_TABLET | Freq: Two times a day (BID) | ORAL | 1 refills | Status: DC

## 2024-02-12 MED ORDER — ZOLEDRONIC ACID 4 MG/100ML IV SOLN
4.0000 mg | Freq: Once | INTRAVENOUS | Status: AC
  Administered 2024-02-12: 4 mg via INTRAVENOUS
  Filled 2024-02-12: qty 100

## 2024-02-12 MED ORDER — MONTELUKAST SODIUM 10 MG PO TABS
10.0000 mg | ORAL_TABLET | Freq: Once | ORAL | Status: AC
Start: 2024-02-12 — End: 2024-02-12
  Administered 2024-02-12: 10 mg via ORAL
  Filled 2024-02-12: qty 1

## 2024-02-12 MED ORDER — DIPHENHYDRAMINE HCL 25 MG PO CAPS
50.0000 mg | ORAL_CAPSULE | Freq: Once | ORAL | Status: AC
  Administered 2024-02-12: 50 mg via ORAL
  Filled 2024-02-12: qty 2

## 2024-02-12 MED ORDER — FAMOTIDINE IN NACL 20-0.9 MG/50ML-% IV SOLN
20.0000 mg | Freq: Once | INTRAVENOUS | Status: AC
  Administered 2024-02-12: 20 mg via INTRAVENOUS
  Filled 2024-02-12: qty 50

## 2024-02-12 MED ORDER — ACETAMINOPHEN 325 MG PO TABS
650.0000 mg | ORAL_TABLET | Freq: Once | ORAL | Status: AC
  Administered 2024-02-12: 650 mg via ORAL
  Filled 2024-02-12: qty 2

## 2024-02-12 NOTE — Progress Notes (Signed)
Patient seen by Dr. Addison Naegeli are within treatment parameters.  Labs reviewed: and are not all within treatment parameters.   Dr Candise Che aware K: 2.9, Plts: 126  Per physician team, patient is ready for treatment. Please note that modifications are being made to the treatment plan including    Pt will get oral K , one dose today

## 2024-02-12 NOTE — Progress Notes (Signed)
Pt given Revlimid rx on 02/12/24. Pt to start taking on 02/18/24 and take for 21 days:02/18/24 - 03/09/24 Pt will take 7 days off from 03/10/24 - 03/16/24. Pt will restart next cycle on 03/17/24. Pt is aware of dates and verbalizes understanding.

## 2024-02-12 NOTE — Progress Notes (Signed)
HEMATOLOGY/ONCOLOGY CLINIC NOTE  Date of Service: 02/12/24  Chief complaint - Follow-up for continued evaluation and management of high risk multiple myeloma  Current treatment-daratumumab/Revlimid/dexamethasone Has not f/u on and does not appear keen to consider Melphalan/HDT-AUTOHSCT  INTERVAL HISTORY:  Donald Suarez is a 58 y.o. male is here for continued evaluation and management of his multiple myeloma. Patient was last seen by me on 01/15/2024 and reported a productive cough with yellow phlegm  Today, he is scheduled to receive cycle 18 day 1 of treatment. Patient is accompanied by an interpreter.   Patient has been tolerating Revlimid and Daratumumab infusions well with no toxicity issues. Patient has been taking Revlimid 25 MG regularly with no missed doses.   He denies any new concerns such as infection issues, new bone pain, new dizziness, leg swelling, back pain, or abdominal pain.   He reports that he has not been seen by [facility] in the past.   MEDICAL HISTORY:  Past Medical History:  Diagnosis Date   Abfraction 08/06/2022   Accretions on teeth 08/06/2022   Anemia    Attrition, teeth excessive 08/06/2022   Caries 08/06/2022   Defective dental restoration 08/06/2022   Elevated ferritin 08/07/2022   Hypocalcemia 08/19/2022   In setting of multiple myeloma    Loose, teeth 08/06/2022   Malocclusion 08/06/2022   Microalbuminuria 08/19/2022   Periodontal disease 08/06/2022   Peripheral neuropathy 09/08/2022   Started 08/2022 A/w diabetes and revlimid usage Burning on top of left foot   Teeth missing 08/06/2022    SURGICAL HISTORY: Past Surgical History:  Procedure Laterality Date   APPENDECTOMY     [place] BONE MARROW BIOPSY & ASPIRATION  02/11/2023   [place] FLUORO GUIDE CV LINE RIGHT  05/15/2022   [place] PATIENT EVAL TECH 0-60 MINS  05/19/2022   [place] US GUIDE VASC ACCESS RIGHT  05/15/2022    SOCIAL HISTORY: Social History   Socioeconomic History   Marital status: Married     Spouse name: Not on file   Number of children: Not on file   Years of education: Not on file   Highest education level: Not on file  Occupational History   Not on file  Tobacco Use   Smoking status: Never   Smokeless tobacco: Never  Vaping Use   Vaping status: Never Used  Substance and Sexual Activity   Alcohol use: Never   Drug use: Never   Sexual activity: Not on file  Other Topics Concern   Not on file  Social History Narrative   Not on file   Social Drivers of Health   Financial Resource Strain: Medium Risk (05/21/2022)   Overall Financial Resource Strain (CARDIA)    Difficulty of Paying Living Expenses: Somewhat hard  Food Insecurity: Food Insecurity Present (05/21/2022)   Hunger Vital Sign    Worried About Running Out of Food in the Last Year: Sometimes true    Ran Out of Food in the Last Year: Sometimes true  Transportation Needs: No Transportation Needs (05/21/2022)   PRAPARE - Administrator, Civil Service (Medical): No    Lack of Transportation (Non-Medical): No  Physical Activity: Not on file  Stress: Not on file  Social Connections: Not on file  Intimate Partner Violence: Not on file    FAMILY HISTORY: No family history on file.  ALLERGIES:  has no known allergies.  MEDICATIONS:  . Allergies as of 02/12/2024   No Known Allergies      Medication List  Accurate as of February 12, 2024  8:37 AM. If you have any questions, ask your nurse or doctor.          Accu-Chek Guide test strip Generic drug: glucose blood Use to test blood sugars up to 4 times daily as directed.   Accu-Chek Guide w/Device Kit Use to test blood sugars up to 4 times daily as directed.   Accu-Chek Softclix Lancets lancets Use to test blood sugars up to 4 times daily as directed.   acyclovir 400 MG tablet Commonly known as: ZOVIRAX Take 1 tablet (400 mg total) by mouth 2 (two) times daily.   aspirin EC 81 MG tablet Take 1 tablet (81 mg total) by  mouth daily. Swallow whole.   B-12 1000 MCG Caps Take 1 tablet by mouth daily.   benzonatate 100 MG capsule Commonly known as: TESSALON Take 1 capsule (100 mg total) by mouth 2 (two) times daily as needed for cough.   Dexcom G7 Receiver Devi 1 Device by Does not apply route continuous.   Dexcom G7 Sensor Misc 1 each by Does not apply route continuous. Change sensor every ten days.   Farxiga 10 MG Tabs tablet Generic drug: dapagliflozin propanediol Take 1 tablet (10 mg total) by mouth daily before breakfast.   gabapentin 300 MG capsule Commonly known as: NEURONTIN Take 1 capsule (300 mg total) by mouth 3 (three) times daily.   Klor-Con M20 20 MEQ tablet Generic drug: potassium chloride SA TAKE 1 TABLET BY MOUTH EVERY DAY   lenalidomide 25 MG capsule Commonly known as: REVLIMID TAKE 1 CAPSULE BY MOUTH 1 TIME A DAY FOR 21 DAYS ON THEN 7 DAYS OFF   metFORMIN 500 MG tablet Commonly known as: GLUCOPHAGE Take 2 tablets (1,000 mg total) by mouth 2 (two) times daily with a meal. Note dose increased. Replaces 500 mg twice daily.   ondansetron 8 MG tablet Commonly known as: Zofran Take 1 tablet (8 mg total) by mouth every 8 (eight) hours as needed for nausea or vomiting.   prochlorperazine 10 MG tablet Commonly known as: COMPAZINE Take 1 tablet (10 mg total) by mouth every 6 (six) hours as needed for nausea or vomiting.   rosuvastatin 10 MG tablet Commonly known as: CRESTOR Take 1 tablet (10 mg total) by mouth daily.   tirzepatide 5 MG/0.5ML Pen Commonly known as: MOUNJARO Inject 5 mg into the skin once a week.         REVIEW OF SYSTEMS:    10 Point review of Systems was done is negative except as noted above.   PHYSICAL EXAMINATION: ..BP 138/83 (BP Location: Left Arm, Patient Position: Sitting)   Pulse 82   Temp (!) 97.3 F (36.3 C) (Temporal)   Resp 16   Wt 170 lb 14.4 oz (77.5 kg)   SpO2 100%   BMI 27.58 kg/m  GENERAL:alert, in no acute distress and  comfortable SKIN: no acute rashes, no significant lesions EYES: conjunctiva are pink and non-injected, sclera anicteric OROPHARYNX: MMM, no exudates, no oropharyngeal erythema or ulceration NECK: supple, no JVD LYMPH:  no palpable lymphadenopathy in the cervical, axillary or inguinal regions LUNGS: clear to auscultation b/l with normal respiratory effort HEART: regular rate & rhythm ABDOMEN:  normoactive bowel sounds , non tender, not distended. Extremity: no pedal edema PSYCH: alert & oriented x 3 with fluent speech NEURO: no focal motor/sensory deficits     LABORATORY DATA:  I have reviewed the data as listed .    Latest Ref Rng &  Units 02/12/2024    8:58 AM 01/15/2024   12:35 PM 12/18/2023    9:52 AM  CBC  WBC 4.0 - 10.5 K/uL 7.0  11.5  10.4   Hemoglobin 13.0 - 17.0 g/dL 16.1  09.6  04.5   Hematocrit 39.0 - 52.0 % 45.6  45.3  42.6   Platelets 150 - 400 K/uL 126  172  384    .    Latest Ref Rng & Units 02/12/2024    9:09 AM 01/15/2024   12:35 PM 12/18/2023    9:52 AM  CMP  Glucose 70 - 99 mg/dL 409  811  914   BUN 6 - 20 mg/dL 11  13  9    Creatinine 0.61 - 1.24 mg/dL 7.82  9.56  2.13   Sodium 135 - 145 mmol/L 138  137  138   Potassium 3.5 - 5.1 mmol/L 2.9  3.4  3.4   Chloride 98 - 111 mmol/L 102  104  103   CO2 22 - 32 mmol/L 28  24  25    Calcium 8.9 - 10.3 mg/dL 9.1  9.5  9.0   Total Protein 6.5 - 8.1 g/dL 6.6  7.1  6.7   Total Bilirubin 0.0 - 1.2 mg/dL 0.7  0.6  0.5   Alkaline Phos 38 - 126 U/L 50  70  100   AST 15 - 41 U/L 11  13  30    ALT 0 - 44 U/L 12  17  39    . Lab Results  Component Value Date   LDH 114 10/10/2022      Bone marrow biopsy 02/11/2023   RADIOGRAPHIC STUDIES: I have personally reviewed the radiological images as listed and agreed with the findings in the report. No results found.  ASSESSMENT & PLAN:   58 y.o. male with  1) R-ISS stage III high risk IgG Lambda Multiple myeloma not on treatment for more than 1 year due to patient's  lack of follow-up.  Patient diagnosed December 2021 and received 1 cycle of CyBorD. Had previously presented with anemia renal insufficiency and hyperviscosity symptoms. Bx- 90% plasma cells Initial M spike 8.03g/dl YQM(5H): Not Detected  Dup(1q): Not Detected  Gains(15): DETECTED  Gains(5 and 9): Not Detected  Del(13q)/-13: DETECTED  Del(17p)(TP53): Not Detected  IGH(Rearrangement): SEE BELOW   IgH complex: t(4;14): DETECTED  t(11;14): Not Detected  t(14;16): Not Detected  t(14;20): Not Detected   Cytogenetics: Normal male karyotype.   -Labs from 05/13/2022-beta-2 microglobulin 4.4, LDH 197, lambda free light chain 77.1, kappa, lambda light chain ratio 0.08, multiple myeloma panel pending.  UPEP ordered but not yet collected. 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  Due to multiple myeloma   PLAN  -Discussed lab results on 02/12/24 in detail with patient. CBC stable, showed WBC of 7.0K, hemoglobin of 15.3, and platelets of 126K. -CMP shows low potassium level of 2.9 mmol/L, most likely due to Revlimid and Farxiga -will start oral potassium -myeloma panel from 01/15/2024 shows M protein gradually increased to 0.3 g/dL after being stable at 0.2 g/dL  over the last 6 months -myeloma panel from today is pending at time of clinical visit -will increase daratumumab infusions from once a month to every two weeks and continue to monitor M protein to ensure M protein does not increase. Patient is  agreeable.  -Continue monthly 25 mg Revlimid po 3 weeks on 1 week off -discussed that based on his mutation, there is concern that his myeloma will progress at some point and will not be controlled for the long term -discussed that if there is concern for disease progression, there may be a need to receive treatment at an academic center as the treatment may not be available at [facility] -patient still would not like to consider a bone marrow transplant at this time.  -answered all of patient's questions in detail -continue to follow with PCP to manage blood sugar levels.   Follow-up -Changing Daratumumab to every 2 weeks...plz schedule per integrated scheduling. -MD visit in 6 weeks  The total time spent in the appointment was 30 minutes* .  All of the patient's questions were answered with apparent satisfaction. The patient knows to call the clinic with any problems, questions or concerns.   Wyvonnia Lora MD MS AAHIVMS [facility] Hematology/Oncology Physician [facility]  .*Total Encounter Time as defined by the Centers for Medicare and Medicaid Services includes, in addition to the face-to-face time of a patient visit (documented in the note above) non-face-to-face time: obtaining and reviewing outside history, ordering and reviewing medications, tests or procedures, care coordination (communications with other health care professionals or caregivers) and documentation in the medical record.    I,Mitra Faeizi,acting as a Neurosurgeon for Wyvonnia Lora, MD.,have documented all relevant documentation on the behalf of Wyvonnia Lora, MD,as directed by  Wyvonnia Lora, MD while in the presence of Wyvonnia Lora, MD.  .I have reviewed the above documentation for accuracy and completeness, and I agree with the above. Johney Maine MD

## 2024-02-12 NOTE — Patient Instructions (Signed)
[facility] - A DEPT OF [facility]  Discharge Instructions: Thank you for choosing [facility] to provide your oncology and hematology care.   If you have a lab appointment with the Cancer Center, please go directly to the Cancer Center and check in at the registration area.   Wear comfortable clothing and clothing appropriate for easy access to any Portacath or PICC line.   We strive to give you quality time with your provider. You may need to reschedule your appointment if you arrive late (15 or more minutes).  Arriving late affects you and other patients whose appointments are after yours.  Also, if you miss three or more appointments without notifying the office, you may be dismissed from the clinic at the provider's discretion.      For prescription refill requests, have your pharmacy contact our office and allow 72 hours for refills to be completed.    Today you received the following chemotherapy and/or immunotherapy agents Darzalex      To help prevent nausea and vomiting after your treatment, we encourage you to take your nausea medication as directed.  BELOW ARE SYMPTOMS THAT SHOULD BE REPORTED IMMEDIATELY: *FEVER GREATER THAN 100.4 F (38 C) OR HIGHER *CHILLS OR SWEATING *NAUSEA AND VOMITING THAT IS NOT CONTROLLED WITH YOUR NAUSEA MEDICATION *UNUSUAL SHORTNESS OF BREATH *UNUSUAL BRUISING OR BLEEDING *URINARY PROBLEMS (pain or burning when urinating, or frequent urination) *BOWEL PROBLEMS (unusual diarrhea, constipation, pain near the anus) TENDERNESS IN MOUTH AND THROAT WITH OR WITHOUT PRESENCE OF ULCERS (sore throat, sores in mouth, or a toothache) UNUSUAL RASH, SWELLING OR PAIN  UNUSUAL VAGINAL DISCHARGE OR ITCHING   Items with * indicate a potential emergency and should be followed up as soon as possible or go to the Emergency Department if any problems should occur.  Please show the CHEMOTHERAPY ALERT CARD or IMMUNOTHERAPY  ALERT CARD at check-in to the Emergency Department and triage nurse.  Should you have questions after your visit or need to cancel or reschedule your appointment, please contact [facility] - A DEPT OF [facility]  Dept: [phone]  and follow the prompts.  Office hours are 8:00 a.m. to 4:30 p.m. Monday - Friday. Please note that voicemails left after 4:00 p.m. may not be returned until the following business day.  We are closed weekends and major holidays. You have access to a nurse at all times for urgent questions. Please call the main number to the clinic Dept: [phone] and follow the prompts.   For any non-urgent questions, you may also contact your provider using MyChart. We now offer e-Visits for anyone 20 and older to request care online for non-urgent symptoms. For details visit [email or web address].   Also download the MyChart app! Go to the app store, search "MyChart", open the app, select [place], and log in with your MyChart username and password.

## 2024-02-16 LAB — MULTIPLE MYELOMA PANEL, SERUM
Albumin SerPl Elph-Mcnc: 3.8 g/dL (ref 2.9–4.4)
Albumin/Glob SerPl: 1.6 (ref 0.7–1.7)
Alpha 1: 0.2 g/dL (ref 0.0–0.4)
Alpha2 Glob SerPl Elph-Mcnc: 0.8 g/dL (ref 0.4–1.0)
B-Globulin SerPl Elph-Mcnc: 0.8 g/dL (ref 0.7–1.3)
Gamma Glob SerPl Elph-Mcnc: 0.7 g/dL (ref 0.4–1.8)
Globulin, Total: 2.4 g/dL (ref 2.2–3.9)
IgA: 108 mg/dL (ref 90–386)
IgG (Immunoglobin G), Serum: 800 mg/dL (ref 603–1613)
IgM (Immunoglobulin M), Srm: 25 mg/dL (ref 20–172)
M Protein SerPl Elph-Mcnc: 0.3 g/dL — ABNORMAL HIGH
Total Protein ELP: 6.2 g/dL (ref 6.0–8.5)

## 2024-02-18 ENCOUNTER — Encounter: Payer: Self-pay | Admitting: Hematology

## 2024-02-22 ENCOUNTER — Ambulatory Visit (INDEPENDENT_AMBULATORY_CARE_PROVIDER_SITE_OTHER): Payer: 59 | Admitting: Internal Medicine

## 2024-02-22 ENCOUNTER — Encounter: Payer: Self-pay | Admitting: Internal Medicine

## 2024-02-22 VITALS — BP 158/94 | HR 66 | Temp 97.9°F | Ht 66.0 in | Wt 178.4 lb

## 2024-02-22 DIAGNOSIS — N189 Chronic kidney disease, unspecified: Secondary | ICD-10-CM | POA: Insufficient documentation

## 2024-02-22 DIAGNOSIS — C9000 Multiple myeloma not having achieved remission: Secondary | ICD-10-CM

## 2024-02-22 DIAGNOSIS — D759 Disease of blood and blood-forming organs, unspecified: Secondary | ICD-10-CM

## 2024-02-22 DIAGNOSIS — E1165 Type 2 diabetes mellitus with hyperglycemia: Secondary | ICD-10-CM | POA: Diagnosis not present

## 2024-02-22 DIAGNOSIS — E1129 Type 2 diabetes mellitus with other diabetic kidney complication: Secondary | ICD-10-CM | POA: Insufficient documentation

## 2024-02-22 DIAGNOSIS — R809 Proteinuria, unspecified: Secondary | ICD-10-CM

## 2024-02-22 DIAGNOSIS — Z794 Long term (current) use of insulin: Secondary | ICD-10-CM

## 2024-02-22 DIAGNOSIS — Z9189 Other specified personal risk factors, not elsewhere classified: Secondary | ICD-10-CM | POA: Insufficient documentation

## 2024-02-22 DIAGNOSIS — I10 Essential (primary) hypertension: Secondary | ICD-10-CM | POA: Diagnosis not present

## 2024-02-22 DIAGNOSIS — E785 Hyperlipidemia, unspecified: Secondary | ICD-10-CM

## 2024-02-22 MED ORDER — TIRZEPATIDE 7.5 MG/0.5ML ~~LOC~~ SOAJ: 7.5000 mg | SUBCUTANEOUS | 3 refills | Status: DC

## 2024-02-22 MED ORDER — LOSARTAN POTASSIUM 25 MG PO TABS: 25.0000 mg | ORAL_TABLET | Freq: Every day | ORAL | 3 refills | Status: AC

## 2024-02-22 MED ORDER — ASPIRIN 81 MG PO TBEC: 81.0000 mg | DELAYED_RELEASE_TABLET | Freq: Every day | ORAL | 3 refills | Status: AC

## 2024-02-22 NOTE — Assessment & Plan Note (Signed)
 Type 2 Diabetes Mellitus is suboptimally controlled with an A1c averaging 7.5-8. Currently on Mounjaro 5 mg injection and metformin. Emphasized maintaining A1c below 7 to prevent complications such as kidney, vascular, and ocular issues. Discussed reducing rice intake for better glucose control and the benefits of increasing the Mounjaro dose for improved glucose control and vascular protection. Increase Mounjaro to 7.5 mg after two more weeks of 5 mg. Continue metformin. Monitor postprandial blood glucose, especially after rice consumption. Follow-up in one month to recheck A1c.  Lab Results  Component Value Date   HGBA1C 8.4 (H) 12/09/2023   HGBA1C 8.9 (H) 09/09/2023   HGBA1C 8.1 (H) 06/09/2023   Most of today spent medication reconciliation - fully completed, will complete remaining diabetes mellitus quality management  at upcoming visit.

## 2024-02-22 NOTE — Assessment & Plan Note (Signed)
 add losartan after patient left

## 2024-02-22 NOTE — Assessment & Plan Note (Signed)
>>  ASSESSMENT AND PLAN FOR AT HIGH RISK FOR VENOUS THROMBOEMBOLISM WRITTEN ON 02/22/2024  1:24 PM BY Delyla Sandeen G, MD   Risk of Thromboembolism There is an increased risk of thromboembolism due to multiple myeloma. Advised to take aspirin  81 mg daily to reduce the risk of blood clots and prevent heart attacks. Start aspirin  81 mg daily.

## 2024-02-22 NOTE — Assessment & Plan Note (Signed)
 Added on losartan after visit due to high blood pressure, proteinuria, noted he is not on it and diabetes mellitus uncontrolled. Check CT cardiac score

## 2024-02-22 NOTE — Assessment & Plan Note (Signed)
  Risk of Thromboembolism There is an increased risk of thromboembolism due to multiple myeloma. Advised to take aspirin 81 mg daily to reduce the risk of blood clots and prevent heart attacks. Start aspirin 81 mg daily.

## 2024-02-22 NOTE — Progress Notes (Signed)
 ==============================  [facility]: [phone]   -- Medical Office Visit --  Patient: Donald Suarez      Age: 58 y.o.       Sex:  male  Date:   02/22/2024 Today's Healthcare Provider: Lula Olszewski, MD  ==============================   CHIEF COMPLAINT: 1 month follow-up and Diabetes  SUBJECTIVE: Background This is a 58 y.o. male who has Multiple myeloma in remission (HCC); Counseling regarding advance care planning and goals of care; Hypoalbuminemia; Hyperviscosity; Type 2 diabetes mellitus with hyperglycemia (HCC); Chronic apical periodontitis; Hyperproteinemia; Blurry vision; Hypomagnesemia; Elevated ferritin; Microalbuminuria; Hypocalcemia; Peripheral neuropathy; Language barrier affecting health care; Hypokalemia; History of dental problems; At high risk for venous thromboembolism; Diabetes mellitus with proteinuria (HCC); Hypertension; and Chronic kidney disease on their problem list.    58 year old male with multiple myeloma and diabetes who presents for medication management and follow-up.  He is managing multiple myeloma with Revlimid and has not experienced nausea, so he is not taking Compazine. He has been inconsistent with aspirin for blood clot prevention and has not taken acyclovir since August, despite having a year's supply for antiviral protection.  For diabetes management, he is on Mounjaro 5 mg injections for two weeks without side effects, and his glucose levels are well-controlled with a recent reading of 112 mg/dL. He does not use a continuous glucose monitor due to work interference but checks his glucose levels regularly, especially postprandially. His A1c levels have been averaging around 8%.  He is taking [place] for kidney protection, Crestor (rosuvastatin) for cholesterol, and gabapentin for nerve pain. He was prescribed potassium supplements a few weeks ago and is taking them as directed.  Reviewed chart records that  patient  has a past medical history of Abfraction (08/06/2022), Accretions on teeth (08/06/2022), Anemia, Attrition, teeth excessive (08/06/2022), Caries (08/06/2022), Defective dental restoration (08/06/2022), Elevated ferritin (08/07/2022), Hypocalcemia (08/19/2022), Loose, teeth (08/06/2022), Malocclusion (08/06/2022), Microalbuminuria (08/19/2022), Periodontal disease (08/06/2022), Peripheral neuropathy (09/08/2022), and Teeth missing (08/06/2022). Discussed Past Medical History - Multiple myeloma - Diabetes  Today's Verbally Confirmed Medications - complete medication reconciliation performed with interpreter assistance - Mounjaro injection 5 mg - Neurontin - Gabapentin - Revlimid - Farxiga - Dapagliflozin - Potassium supplement - Acyclovir not taking but will - Zofran - Aspirin 81 mg not taking but will - Metformin Current Outpatient Medications on File Prior to Visit  Medication Sig   Accu-Chek Softclix Lancets lancets Use to test blood sugars up to 4 times daily as directed.   acyclovir (ZOVIRAX) 400 MG tablet Take 1 tablet (400 mg total) by mouth 2 (two) times daily.   Blood Glucose Monitoring Suppl (ACCU-CHEK GUIDE) w/Device KIT Use to test blood sugars up to 4 times daily as directed.   Continuous Glucose Receiver (DEXCOM G7 RECEIVER) DEVI 1 Device by Does not apply route continuous.   Continuous Glucose Sensor (DEXCOM G7 SENSOR) MISC 1 each by Does not apply route continuous. Change sensor every ten days.   Cyanocobalamin (B-12) 1000 MCG CAPS Take 1 tablet by mouth daily.   dapagliflozin propanediol (FARXIGA) 10 MG TABS tablet Take 1 tablet (10 mg total) by mouth daily before breakfast.   gabapentin (NEURONTIN) 300 MG capsule Take 1 capsule (300 mg total) by mouth 3 (three) times daily.   glucose blood (ACCU-CHEK GUIDE) test strip Use to test blood sugars up to 4 times daily as directed.   lenalidomide (REVLIMID) 25 MG capsule TAKE 1 CAPSULE BY MOUTH 1 TIME A DAY  FOR 21 DAYS  ON THEN 7 DAYS OFF   metFORMIN (GLUCOPHAGE) 500 MG tablet Take 2 tablets (1,000 mg total) by mouth 2 (two) times daily with a meal. Note dose increased. Replaces 500 mg twice daily.   potassium chloride SA (KLOR-CON M20) 20 MEQ tablet Take 1 tablet (20 mEq total) by mouth 2 (two) times daily.   rosuvastatin (CRESTOR) 10 MG tablet Take 1 tablet (10 mg total) by mouth daily.   Current Facility-Administered Medications on File Prior to Visit  Medication   clotrimazole (LOTRIMIN) 1 % cream   Medications Discontinued During This Encounter  Medication Reason   benzonatate (TESSALON) 100 MG capsule    prochlorperazine (COMPAZINE) 10 MG tablet Completed Course   ondansetron (ZOFRAN) 8 MG tablet Completed Course   tirzepatide [facility]) 5 MG/0.5ML Pen    aspirin EC 81 MG tablet Reorder      Objective   Physical Exam     02/22/2024    8:54 AM 02/22/2024    8:44 AM 02/12/2024    1:31 PM  Vitals with BMI  Height  5\' 6"    Weight  178 lbs 6 oz   BMI  28.81   Systolic 158 162 161  Diastolic 94 94 74  Pulse  66 72   Wt Readings from Last 10 Encounters:  02/22/24 178 lb 6.4 oz (80.9 kg)  02/12/24 170 lb 14.4 oz (77.5 kg)  01/20/24 177 lb 6.4 oz (80.5 kg)  01/15/24 172 lb 11.2 oz (78.3 kg)  12/18/23 179 lb 8 oz (81.4 kg)  12/09/23 181 lb (82.1 kg)  11/20/23 179 lb 12.8 oz (81.6 kg)  10/23/23 177 lb 8 oz (80.5 kg)  09/25/23 171 lb 3.2 oz (77.7 kg)  09/09/23 168 lb 9.6 oz (76.5 kg)   Vital signs reviewed.  Nursing notes reviewed. Weight trend reviewed. Abnormalities and Problem-Specific physical exam findings:  mild truncal adiposity   General Appearance:  No acute distress appreciable.   Well-groomed, healthy-appearing male.  Well proportioned with no abnormal fat distribution.  Good muscle tone. Pulmonary:  Normal work of breathing at rest, no respiratory distress apparent. SpO2: 98 %  Musculoskeletal: All extremities are intact.  Neurological:  Awake, alert, oriented, and engaged.  No  obvious focal neurological deficits or cognitive impairments.  Sensorium seems unclouded.   Speech is clear and coherent with logical content. Psychiatric:  Appropriate mood, pleasant and cooperative demeanor, thoughtful and engaged during the exam    No results found for any visits on 02/22/24. Appointment on 02/12/2024  Component Date Value   WBC Count 02/12/2024 7.0    RBC 02/12/2024 5.81    Hemoglobin 02/12/2024 15.3    HCT 02/12/2024 45.6    MCV 02/12/2024 78.5 (L)    MCH 02/12/2024 26.3    MCHC 02/12/2024 33.6    RDW 02/12/2024 15.4    Platelet Count 02/12/2024 126 (L)    nRBC 02/12/2024 0.0    Neutrophils Relative % 02/12/2024 41    Neutro Abs 02/12/2024 2.8    Lymphocytes Relative 02/12/2024 42    Lymphs Abs 02/12/2024 3.0    Monocytes Relative 02/12/2024 8    Monocytes Absolute 02/12/2024 0.5    Eosinophils Relative 02/12/2024 8    Eosinophils Absolute 02/12/2024 0.5    Basophils Relative 02/12/2024 1    Basophils Absolute 02/12/2024 0.1    Immature Granulocytes 02/12/2024 0    Abs Immature Granulocytes 02/12/2024 0.02    IgG (Immunoglobin G), Se* 02/12/2024 800    IgA 02/12/2024  108    IgM (Immunoglobulin M), * 02/12/2024 25    Total Protein ELP 02/12/2024 6.2    Albumin SerPl Elph-Mcnc 02/12/2024 3.8    Alpha 1 02/12/2024 0.2    Alpha2 Glob SerPl Elph-M* 02/12/2024 0.8    B-Globulin SerPl Elph-Mc* 02/12/2024 0.8    Gamma Glob SerPl Elph-Mc* 02/12/2024 0.7    M Protein SerPl Elph-Mcnc 02/12/2024 0.3 (H)    Globulin, Total 02/12/2024 2.4    Albumin/Glob SerPl 02/12/2024 1.6    IFE 1 02/12/2024 Comment (A)    Please Note 02/12/2024 Comment    Sodium 02/12/2024 138    Potassium 02/12/2024 2.9 (L)    Chloride 02/12/2024 102    CO2 02/12/2024 28    Glucose, Bld 02/12/2024 211 (H)    BUN 02/12/2024 11    Creatinine 02/12/2024 0.85    Calcium 02/12/2024 9.1    Total Protein 02/12/2024 6.6    Albumin 02/12/2024 4.2    AST 02/12/2024 11 (L)    ALT 02/12/2024 12     Alkaline Phosphatase 02/12/2024 50    Total Bilirubin 02/12/2024 0.7    GFR, Estimated 02/12/2024 >60    Anion gap 02/12/2024 8   Appointment on 01/15/2024  Component Date Value   WBC Count 01/15/2024 11.5 (H)    RBC 01/15/2024 5.67    Hemoglobin 01/15/2024 15.0    HCT 01/15/2024 45.3    MCV 01/15/2024 79.9 (L)    MCH 01/15/2024 26.5    MCHC 01/15/2024 33.1    RDW 01/15/2024 16.2 (H)    Platelet Count 01/15/2024 172    nRBC 01/15/2024 0.0    Neutrophils Relative % 01/15/2024 54    Neutro Abs 01/15/2024 6.3    Lymphocytes Relative 01/15/2024 31    Lymphs Abs 01/15/2024 3.5    Monocytes Relative 01/15/2024 13    Monocytes Absolute 01/15/2024 1.5 (H)    Eosinophils Relative 01/15/2024 1    Eosinophils Absolute 01/15/2024 0.1    Basophils Relative 01/15/2024 1    Basophils Absolute 01/15/2024 0.1    Immature Granulocytes 01/15/2024 0    Abs Immature Granulocytes 01/15/2024 0.04    Sodium 01/15/2024 137    Potassium 01/15/2024 3.4 (L)    Chloride 01/15/2024 104    CO2 01/15/2024 24    Glucose, Bld 01/15/2024 191 (H)    BUN 01/15/2024 13    Creatinine, Ser 01/15/2024 0.79    Calcium 01/15/2024 9.5    Total Protein 01/15/2024 7.1    Albumin 01/15/2024 4.1    AST 01/15/2024 13 (L)    ALT 01/15/2024 17    Alkaline Phosphatase 01/15/2024 70    Total Bilirubin 01/15/2024 0.6    GFR, Estimated 01/15/2024 >60    Anion gap 01/15/2024 9    IgG (Immunoglobin G), Se* 01/15/2024 784    IgA 01/15/2024 119    IgM (Immunoglobulin M), * 01/15/2024 25    Total Protein ELP 01/15/2024 6.5    Albumin SerPl Elph-Mcnc 01/15/2024 3.3    Alpha 1 01/15/2024 0.3    Alpha2 Glob SerPl Elph-M* 01/15/2024 1.2 (H)    B-Globulin SerPl Elph-Mc* 01/15/2024 1.0    Gamma Glob SerPl Elph-Mc* 01/15/2024 0.7    M Protein SerPl Elph-Mcnc 01/15/2024 0.3 (H)    Globulin, Total 01/15/2024 3.2    Albumin/Glob SerPl 01/15/2024 1.1    IFE 1 01/15/2024 Comment (A)    Please Note 01/15/2024 Comment    Appointment on 12/18/2023  Component Date Value   WBC Count 12/18/2023 10.4  RBC 12/18/2023 5.55    Hemoglobin 12/18/2023 14.4    HCT 12/18/2023 42.6    MCV 12/18/2023 76.8 (L)    MCH 12/18/2023 25.9 (L)    MCHC 12/18/2023 33.8    RDW 12/18/2023 15.7 (H)    Platelet Count 12/18/2023 384    nRBC 12/18/2023 0.0    Neutrophils Relative % 12/18/2023 73    Neutro Abs 12/18/2023 7.6    Lymphocytes Relative 12/18/2023 19    Lymphs Abs 12/18/2023 2.0    Monocytes Relative 12/18/2023 7    Monocytes Absolute 12/18/2023 0.7    Eosinophils Relative 12/18/2023 0    Eosinophils Absolute 12/18/2023 0.0    Basophils Relative 12/18/2023 0    Basophils Absolute 12/18/2023 0.0    Immature Granulocytes 12/18/2023 1    Abs Immature Granulocytes 12/18/2023 0.10 (H)    Sodium 12/18/2023 138    Potassium 12/18/2023 3.4 (L)    Chloride 12/18/2023 103    CO2 12/18/2023 25    Glucose, Bld 12/18/2023 234 (H)    BUN 12/18/2023 9    Creatinine, Ser 12/18/2023 0.69    Calcium 12/18/2023 9.0    Total Protein 12/18/2023 6.7    Albumin 12/18/2023 3.7    AST 12/18/2023 30    ALT 12/18/2023 39    Alkaline Phosphatase 12/18/2023 100    Total Bilirubin 12/18/2023 0.5    GFR, Estimated 12/18/2023 >60    Anion gap 12/18/2023 10    IgG (Immunoglobin G), Se* 12/18/2023 584 (L)    IgA 12/18/2023 106    IgM (Immunoglobulin M), * 12/18/2023 19 (L)    Total Protein ELP 12/18/2023 6.0    Albumin SerPl Elph-Mcnc 12/18/2023 2.8 (L)    Alpha 1 12/18/2023 0.4    Alpha2 Glob SerPl Elph-M* 12/18/2023 1.5 (H)    B-Globulin SerPl Elph-Mc* 12/18/2023 0.9    Gamma Glob SerPl Elph-Mc* 12/18/2023 0.4    M Protein SerPl Elph-Mcnc 12/18/2023 0.2 (H)    Globulin, Total 12/18/2023 3.2    Albumin/Glob SerPl 12/18/2023 0.9    IFE 1 12/18/2023 Comment (A)    Please Note 12/18/2023 Comment   Lab on 12/09/2023  Component Date Value   Hgb A1c MFr Bld 12/09/2023 8.4 (H)    Mean Plasma Glucose 12/09/2023 194    eAG (mmol/L)  12/09/2023 10.8   Office Visit on 12/09/2023  Component Date Value   Ferritin 12/09/2023 211.8    WBC 12/09/2023 7.7    RBC 12/09/2023 5.92 (H)    Hemoglobin 12/09/2023 15.6    HCT 12/09/2023 47.9    MCV 12/09/2023 81.0    MCHC 12/09/2023 32.5    RDW 12/09/2023 18.2 (H)    Platelets 12/09/2023 149.0 (L)    Neutrophils Relative % 12/09/2023 60.1    Lymphocytes Relative 12/09/2023 22.5    Monocytes Relative 12/09/2023 14.2 (H)    Eosinophils Relative 12/09/2023 2.1    Basophils Relative 12/09/2023 1.1    Neutro Abs 12/09/2023 4.6    Lymphs Abs 12/09/2023 1.7    Monocytes Absolute 12/09/2023 1.1 (H)    Eosinophils Absolute 12/09/2023 0.2    Basophils Absolute 12/09/2023 0.1    Sodium 12/09/2023 135    Potassium 12/09/2023 3.3 (L)    Chloride 12/09/2023 102    CO2 12/09/2023 22    Glucose, Bld 12/09/2023 195 (H)    BUN 12/09/2023 12    Creatinine, Ser 12/09/2023 0.75    Total Bilirubin 12/09/2023 0.7    Alkaline Phosphatase 12/09/2023 61    AST  12/09/2023 32    ALT 12/09/2023 26    Total Protein 12/09/2023 6.9    Albumin 12/09/2023 4.3    GFR 12/09/2023 100.57    Calcium 12/09/2023 8.4    Cholesterol 12/09/2023 186    Triglycerides 12/09/2023 90.0    HDL 12/09/2023 48.60    VLDL 12/09/2023 18.0    LDL Cholesterol 12/09/2023 120 (H)    Total CHOL/HDL Ratio 12/09/2023 4    NonHDL 12/09/2023 137.51    Microalb, Ur 12/09/2023 12.2 (H)    Creatinine,U 12/09/2023 133.2    Microalb Creat Ratio 12/09/2023 9.1   Appointment on 11/20/2023  Component Date Value   WBC Count 11/20/2023 7.1    RBC 11/20/2023 5.78    Hemoglobin 11/20/2023 14.9    HCT 11/20/2023 45.7    MCV 11/20/2023 79.1 (L)    MCH 11/20/2023 25.8 (L)    MCHC 11/20/2023 32.6    RDW 11/20/2023 18.7 (H)    Platelet Count 11/20/2023 131 (L)    nRBC 11/20/2023 0.0    Neutrophils Relative % 11/20/2023 40    Neutro Abs 11/20/2023 2.8    Lymphocytes Relative 11/20/2023 40    Lymphs Abs 11/20/2023 2.9     Monocytes Relative 11/20/2023 10    Monocytes Absolute 11/20/2023 0.7    Eosinophils Relative 11/20/2023 8    Eosinophils Absolute 11/20/2023 0.6 (H)    Basophils Relative 11/20/2023 2    Basophils Absolute 11/20/2023 0.1    Immature Granulocytes 11/20/2023 0    Abs Immature Granulocytes 11/20/2023 0.02    Sodium 11/20/2023 139    Potassium 11/20/2023 3.7    Chloride 11/20/2023 107    CO2 11/20/2023 26    Glucose, Bld 11/20/2023 266 (H)    BUN 11/20/2023 11    Creatinine, Ser 11/20/2023 0.77    Calcium 11/20/2023 9.0    Total Protein 11/20/2023 6.5    Albumin 11/20/2023 4.1    AST 11/20/2023 14 (L)    ALT 11/20/2023 19    Alkaline Phosphatase 11/20/2023 56    Total Bilirubin 11/20/2023 0.6    GFR, Estimated 11/20/2023 >60    Anion gap 11/20/2023 6    IgG (Immunoglobin G), Se* 11/20/2023 698    IgA 11/20/2023 150    IgM (Immunoglobulin M), * 11/20/2023 30    Total Protein ELP 11/20/2023 6.0    Albumin SerPl Elph-Mcnc 11/20/2023 3.6    Alpha 1 11/20/2023 0.3    Alpha2 Glob SerPl Elph-M* 11/20/2023 0.6    B-Globulin SerPl Elph-Mc* 11/20/2023 0.9    Gamma Glob SerPl Elph-Mc* 11/20/2023 0.6    M Protein SerPl Elph-Mcnc 11/20/2023 0.2 (H)    Globulin, Total 11/20/2023 2.4    Albumin/Glob SerPl 11/20/2023 1.6    IFE 1 11/20/2023 Comment (A)    Please Note 11/20/2023 Comment   Appointment on 10/23/2023  Component Date Value   WBC Count 10/23/2023 9.2    RBC 10/23/2023 5.64    Hemoglobin 10/23/2023 14.3    HCT 10/23/2023 43.5    MCV 10/23/2023 77.1 (L)    MCH 10/23/2023 25.4 (L)    MCHC 10/23/2023 32.9    RDW 10/23/2023 19.2 (H)    Platelet Count 10/23/2023 218    nRBC 10/23/2023 0.0    Neutrophils Relative % 10/23/2023 49    Neutro Abs 10/23/2023 4.4    Lymphocytes Relative 10/23/2023 33    Lymphs Abs 10/23/2023 3.1    Monocytes Relative 10/23/2023 9    Monocytes Absolute 10/23/2023 0.9    Eosinophils  Relative 10/23/2023 7    Eosinophils Absolute 10/23/2023 0.7 (H)     Basophils Relative 10/23/2023 2    Basophils Absolute 10/23/2023 0.1    Immature Granulocytes 10/23/2023 0    Abs Immature Granulocytes 10/23/2023 0.03    Sodium 10/23/2023 139    Potassium 10/23/2023 3.4 (L)    Chloride 10/23/2023 108    CO2 10/23/2023 26    Glucose, Bld 10/23/2023 230 (H)    BUN 10/23/2023 13    Creatinine, Ser 10/23/2023 0.76    Calcium 10/23/2023 9.0    Total Protein 10/23/2023 6.6    Albumin 10/23/2023 4.1    AST 10/23/2023 13 (L)    ALT 10/23/2023 20    Alkaline Phosphatase 10/23/2023 62    Total Bilirubin 10/23/2023 0.9    GFR, Estimated 10/23/2023 >60    Anion gap 10/23/2023 5    IgG (Immunoglobin G), Se* 10/23/2023 689    IgA 10/23/2023 145    IgM (Immunoglobulin M), * 10/23/2023 26    Total Protein ELP 10/23/2023 6.1    Albumin SerPl Elph-Mcnc 10/23/2023 3.6    Alpha 1 10/23/2023 0.3    Alpha2 Glob SerPl Elph-M* 10/23/2023 0.7    B-Globulin SerPl Elph-Mc* 10/23/2023 0.9    Gamma Glob SerPl Elph-Mc* 10/23/2023 0.6    M Protein SerPl Elph-Mcnc 10/23/2023 0.2 (H)    Globulin, Total 10/23/2023 2.5    Albumin/Glob SerPl 10/23/2023 1.5    IFE 1 10/23/2023 Comment (A)    Please Note 10/23/2023 Comment   Appointment on 09/25/2023  Component Date Value   WBC Count 09/25/2023 8.2    RBC 09/25/2023 5.53    Hemoglobin 09/25/2023 13.6    HCT 09/25/2023 42.1    MCV 09/25/2023 76.1 (L)    MCH 09/25/2023 24.6 (L)    MCHC 09/25/2023 32.3    RDW 09/25/2023 17.6 (H)    Platelet Count 09/25/2023 226    nRBC 09/25/2023 0.0    Neutrophils Relative % 09/25/2023 45    Neutro Abs 09/25/2023 3.7    Lymphocytes Relative 09/25/2023 40    Lymphs Abs 09/25/2023 3.3    Monocytes Relative 09/25/2023 11    Monocytes Absolute 09/25/2023 0.9    Eosinophils Relative 09/25/2023 3    Eosinophils Absolute 09/25/2023 0.2    Basophils Relative 09/25/2023 1    Basophils Absolute 09/25/2023 0.1    Immature Granulocytes 09/25/2023 0    Abs Immature Granulocytes 09/25/2023  0.02    Sodium 09/25/2023 137    Potassium 09/25/2023 3.5    Chloride 09/25/2023 105    CO2 09/25/2023 26    Glucose, Bld 09/25/2023 159 (H)    BUN 09/25/2023 12    Creatinine, Ser 09/25/2023 0.78    Calcium 09/25/2023 9.0    Total Protein 09/25/2023 6.8    Albumin 09/25/2023 3.9    AST 09/25/2023 15    ALT 09/25/2023 21    Alkaline Phosphatase 09/25/2023 67    Total Bilirubin 09/25/2023 0.5    GFR, Estimated 09/25/2023 >60    Anion gap 09/25/2023 6    IgG (Immunoglobin G), Se* 09/25/2023 659    IgA 09/25/2023 160    IgM (Immunoglobulin M), * 09/25/2023 30    Total Protein ELP 09/25/2023 6.3    Albumin SerPl Elph-Mcnc 09/25/2023 3.5    Alpha 1 09/25/2023 0.1    Alpha2 Glob SerPl Elph-M* 09/25/2023 1.2 (H)    B-Globulin SerPl Elph-Mc* 09/25/2023 1.0    Gamma Glob SerPl Elph-Mc* 09/25/2023 0.5  M Protein SerPl Elph-Mcnc 09/25/2023 0.2 (H)    Globulin, Total 09/25/2023 2.8    Albumin/Glob SerPl 09/25/2023 1.3    IFE 1 09/25/2023 Comment (A)    Please Note 09/25/2023 Comment   Lab on 09/09/2023  Component Date Value   Hgb A1c MFr Bld 09/09/2023 8.9 (H)    Mean Plasma Glucose 09/09/2023 209    eAG (mmol/L) 09/09/2023 11.6   Office Visit on 09/09/2023  Component Date Value   WBC 09/09/2023 8.9    RBC 09/09/2023 5.28    Hemoglobin 09/09/2023 13.0    HCT 09/09/2023 41.2    MCV 09/09/2023 78.0    MCHC 09/09/2023 31.5    RDW 09/09/2023 16.8 (H)    Platelets 09/09/2023 280.0    Neutrophils Relative % 09/09/2023 52.5    Lymphocytes Relative 09/09/2023 38.1    Monocytes Relative 09/09/2023 7.1    Eosinophils Relative 09/09/2023 1.5    Basophils Relative 09/09/2023 0.8    Neutro Abs 09/09/2023 4.7    Lymphs Abs 09/09/2023 3.4    Monocytes Absolute 09/09/2023 0.6    Eosinophils Absolute 09/09/2023 0.1    Basophils Absolute 09/09/2023 0.1    Sodium 09/09/2023 136    Potassium 09/09/2023 4.1    Chloride 09/09/2023 98    CO2 09/09/2023 29    Glucose, Bld 09/09/2023 155 (H)     BUN 09/09/2023 9    Creatinine, Ser 09/09/2023 0.71    Total Bilirubin 09/09/2023 0.5    Alkaline Phosphatase 09/09/2023 85    AST 09/09/2023 18    ALT 09/09/2023 34    Total Protein 09/09/2023 7.4    Albumin 09/09/2023 3.9    GFR 09/09/2023 102.43    Calcium 09/09/2023 9.8    Cholesterol 09/09/2023 163    Triglycerides 09/09/2023 88.0    HDL 09/09/2023 33.70 (L)    VLDL 09/09/2023 17.6    LDL Cholesterol 09/09/2023 112 (H)    Total CHOL/HDL Ratio 09/09/2023 5    NonHDL 09/09/2023 129.77    Microalb, Ur 09/09/2023 1.2    Creatinine,U 09/09/2023 118.8    Microalb Creat Ratio 09/09/2023 1.0   There may be more visits with results that are not included.  No image results found. No results found.NM PET Image Restage (PS) Whole Body Result Date: 02/23/2023 CLINICAL DATA:  Subsequent treatment strategy for multiple myeloma. EXAM: NUCLEAR MEDICINE PET WHOLE BODY TECHNIQUE: 8.5 mCi F-18 FDG was injected intravenously. Full-ring PET imaging was performed from the head to foot after the radiotracer. CT data was obtained and used for attenuation correction and anatomic localization. Fasting blood glucose: 200 mg/dl COMPARISON:  91/[id number]/8295. FINDINGS: Mediastinal blood pool activity: SUV max 2.3 HEAD/NECK: No abnormal hypermetabolism. Incidental CT findings: None. CHEST: No abnormal hypermetabolism. Incidental CT findings: Heart is enlarged.  No pericardial or pleural effusion. ABDOMEN/PELVIS: No abnormal hypermetabolism. Incidental CT findings: Liver is unremarkable. Stones in the gallbladder. Adrenal glands, kidneys, spleen, pancreas, stomach and bowel are grossly unremarkable. Atherosclerotic calcification of the aorta. SKELETON: Persistent focal uptake in the anterior left second rib corresponds to an old, possibly pathologic, fracture, SUV max 2.5, stable. No new areas of abnormal hypermetabolism. Otherwise, mild patchy uptake in the spine without focality. No new hypermetabolic lesions.  Incidental CT findings: Extensive lytic disease throughout the visualized osseous structures, as before. EXTREMITIES: No focal abnormal hypermetabolism. Incidental CT findings: None. IMPRESSION: 1. Persistent mild uptake associated with the anterior left second rib, possibly due to an old pathologic fracture. Mild patchy metabolism throughout the spine is  likely treatment related. Extensive lytic disease throughout the visualized osseous structures without new focal areas of abnormal hypermetabolism to suggest metabolically active multiple myeloma. 2. Cholelithiasis. 3.  Aortic atherosclerosis (ICD10-I70.0). Electronically Signed   By: Leanna Battles M.D.   On: 02/23/2023 08:18       Assessment & Plan Type 2 diabetes mellitus with hyperglycemia, with long-term current use of insulin (HCC) Type 2 Diabetes Mellitus is suboptimally controlled with an A1c averaging 7.5-8. Currently on Mounjaro 5 mg injection and metformin. Emphasized maintaining A1c below 7 to prevent complications such as kidney, vascular, and ocular issues. Discussed reducing rice intake for better glucose control and the benefits of increasing the Mounjaro dose for improved glucose control and vascular protection. Increase Mounjaro to 7.5 mg after two more weeks of 5 mg. Continue metformin. Monitor postprandial blood glucose, especially after rice consumption. Follow-up in one month to recheck A1c.  Lab Results  Component Value Date   HGBA1C 8.4 (H) 12/09/2023   HGBA1C 8.9 (H) 09/09/2023   HGBA1C 8.1 (H) 06/09/2023   Most of today spent medication reconciliation - fully completed, will complete remaining diabetes mellitus quality management  at upcoming visit. Multiple myeloma not having achieved remission (HCC) Defer to oncology but impacts proteinuria management. Chronic kidney disease, unspecified CKD stage Normal gfr, just microalbuminuria, due to Myeloma and diabetes with proteinuria, improved with jardiance Nephrologist: gfr  good, not yet needed  Medicines:     ARB/ACEI:   adding losartan    SGLT2 inhibitor:   jardiance  Imaging:  Labwork:  Creatinine (mg/dL)  Date Value  19/14/7829 0.85  04/10/2023 0.78  10/10/2022 0.56 (L)  09/11/2022 0.64  08/15/2022 0.55 (L)  08/08/2022 0.70  08/01/2022 0.77  07/25/2022 0.63  07/18/2022 0.67  07/11/2022 0.59 (L)  06/27/2022 0.58 (L)  06/20/2022 0.62  06/13/2022 0.75  06/06/2022 0.70  05/21/2022 0.86  01/11/2021 0.82  01/04/2021 1.12  01/01/2021 1.06   Creatinine, Ser (mg/dL)  Date Value  56/21/3086 0.79  12/18/2023 0.69  12/09/2023 0.75  11/20/2023 0.77  10/23/2023 0.76  09/25/2023 0.78  09/09/2023 0.71  08/28/2023 0.71  07/31/2023 0.65  07/03/2023 0.86  06/09/2023 0.68  06/05/2023 0.84  05/08/2023 0.73  04/24/2023 0.71  03/27/2023 0.82  03/13/2023 0.72  02/27/2023 0.72  02/13/2023 0.63  01/30/2023 0.76  01/16/2023 0.74  01/02/2023 0.69  12/19/2022 0.82  12/05/2022 0.67  11/21/2022 0.69  11/07/2022 0.64  08/12/2022 0.61  05/20/2022 0.72  05/19/2022 0.68  05/18/2022 0.88  05/18/2022 0.72  05/17/2022 0.75  05/16/2022 0.78  05/16/2022 0.86  05/16/2022 1.00  05/15/2022 1.00  05/14/2022 0.90  05/13/2022 1.02  05/13/2022 0.97   GFR (mL/min)  Date Value  12/09/2023 100.57  09/09/2023 102.43  06/09/2023 103.96  08/12/2022 108.05   No results found for: "EGFR"  Lab Results  Component Value Date   VD25OH 16.61 (L) 01/01/2021   NA 138 02/12/2024   K 2.9 (L) 02/12/2024   CL 102 02/12/2024   CO2 28 02/12/2024   CALCIUM 9.1 02/12/2024   PROT 6.6 02/12/2024   ALBUMIN 4.2 02/12/2024   HGB 15.3 02/12/2024   HCT 45.6 02/12/2024   HGBA1C 8.4 (H) 12/09/2023   TOTALPROTELP 6.2 02/12/2024   Lab Results  Component Value Date   COLORURINE STRAW (A) 05/15/2022   APPEARANCEUR CLEAR 05/15/2022   LABSPEC 1.018 05/15/2022   PHURINE 6.0 05/15/2022   GLUCOSEU >=500 (A) 05/15/2022   HGBUR NEGATIVE 05/15/2022   BILIRUBINUR  NEGATIVE 05/15/2022   KETONESUR NEGATIVE 05/15/2022  PROTEINUR NEGATIVE 05/15/2022   NITRITE NEGATIVE 05/15/2022   LEUKOCYTESUR NEGATIVE 05/15/2022   MICROALBUR 12.2 (H) 12/09/2023   Hypertension, unspecified type Added on losartan after visit due to high blood pressure, proteinuria, noted he is not on it and diabetes mellitus uncontrolled. Check CT cardiac score  Diabetes mellitus with proteinuria (HCC) add losartan after patient left At high risk for venous thromboembolism  Risk of Thromboembolism There is an increased risk of thromboembolism due to multiple myeloma. Advised to take aspirin 81 mg daily to reduce the risk of blood clots and prevent heart attacks. Start aspirin 81 mg daily. Hyperlipidemia, unspecified hyperlipidemia type Hyperlipidemia is managed with rosuvastatin 10 mg for now but we need to try to increase dose further in future. Will get CT coronary calcium score to help demonstrate importance. Emphasized the importance of controlling cholesterol levels to prevent cardiovascular complications. Continue rosuvastatin 10 mg daily. There is an increased risk of thromboembolism due to multiple myeloma. Advised to take aspirin 81 mg daily to reduce the risk of blood clots and prevent heart attacks. Start aspirin 81 mg daily. Medications: not yet discussed Lab Results  Component Value Date   HDL 48.60 12/09/2023   HDL 33.70 (L) 09/09/2023   HDL 44.20 06/09/2023   CHOLHDL 4 12/09/2023   CHOLHDL 5 09/09/2023   CHOLHDL 4 06/09/2023   Lab Results  Component Value Date   LDLCALC 120 (H) 12/09/2023   LDLCALC 112 (H) 09/09/2023   LDLCALC 129 (H) 06/09/2023   Lab Results  Component Value Date   TRIG 90.0 12/09/2023   TRIG 88.0 09/09/2023   TRIG 111.0 06/09/2023   Lab Results  Component Value Date   CHOL 186 12/09/2023   CHOL 163 09/09/2023   CHOL 195 06/09/2023   The 10-year ASCVD risk score (Arnett DK, et al., 2019) is: 19.5%   Values used to calculate the  score:     Age: 78 years     Sex: Male     Is Non-Hispanic African American: No     Diabetic: Yes     Tobacco smoker: No     Systolic Blood Pressure: 158 mmHg     Is BP treated: Yes     HDL Cholesterol: 48.6 mg/dL     Total Cholesterol: 186 mg/dL Lab Results  Component Value Date   ALT 12 02/12/2024   AST 11 (L) 02/12/2024   ALKPHOS 50 02/12/2024   HGBA1C 8.4 (H) 12/09/2023   Body mass index is 28.79 kg/m.  Lipoprotein(a), Apolipoprotein B (ApoB), and High-sensitivity C-reactive protein (hs-CRP) No results found for: "HSCRP", "LIPOA" Improving Your Cholesterol: Diet: Focus on a Mediterranean-style diet, limit saturated fats and sugars, and increase omega-3 fatty acids (fish, flaxseeds,nuts,extra virgin olive oil, avocados). Exercise: Engage in regular physical activity (aerobic exercises are particularly beneficial for HDL). Weight Management: Maintain a healthy weight. Smoking Cessation: Quitting smoking improves cholesterol levels.       Orders Placed During this Encounter:   Orders Placed This Encounter  Procedures   CT CARDIAC SCORING (SELF PAY ONLY)    Standing Status:   Future    Expected Date:   02/29/2024    Expiration Date:   05/21/2024    Scheduling Instructions:     Needs special contract interpreters - speaks montagnard vietnamese for scheduling.    Preferred imaging location?:   [facility]    Radiology Contrast Protocol - do NOT remove file path:   [email or web address]    Release to patient:   Immediate  Meds ordered this encounter  Medications   aspirin EC 81 MG tablet    Sig: Take 1 tablet (81 mg total) by mouth daily. Swallow whole.    Dispense:  90 tablet    Refill:  3   tirzepatide (MOUNJARO) 7.5 MG/0.5ML Pen    Sig: Inject 7.5 mg into the skin once a week. Start after 4 weeks total on 5 mg weekly dose.    Dispense:  6 mL    Refill:  3   losartan (COZAAR) 25 MG tablet    Sig: Take 1 tablet (25 mg  total) by mouth daily. Start taking if blood pressure is over 130/80.  Protects kidneys and lowers blood pressure.    Dispense:  90 tablet    Refill:  3       This document was synthesized by artificial intelligence ([employer]bridge) using HIPAA-compliant recording of the clinical interaction;   We discussed the use of AI scribe software for clinical note transcription with the patient, who gave verbal consent to proceed.    Additional Info: This encounter employed state-of-the-art, real-time, collaborative documentation. The patient actively reviewed and assisted in updating their electronic medical record on a shared screen, ensuring transparency and facilitating joint problem-solving for the problem list, overview, and plan. This approach promotes accurate, informed care. The treatment plan was discussed and reviewed in detail, including medication safety, potential side effects, and all patient questions. We confirmed understanding and comfort with the plan. Follow-up instructions were established, including contacting the office for any concerns, returning if symptoms worsen, persist, or new symptoms develop, and precautions for potential emergency department visits.

## 2024-02-22 NOTE — Patient Instructions (Addendum)
 It was a pleasure seeing you today! Your health and satisfaction are our top priorities.  Glenetta Hew, MD  Your Providers PCP: Lula Olszewski, MD,  [phone]) Referring Provider: Lula Olszewski, MD,  [phone]) Care Team Provider: Johney Maine, MD,  [phone])  VISIT SUMMARY:  Today, we reviewed your current health status and medication management for multiple myeloma, diabetes, and other conditions. We discussed adjustments to your diabetes medication, the importance of continuing your current treatments, and preventive measures for potential complications.  YOUR PLAN:  -ELEVATED BLOOD PRESSURE:  Adding Losartan to keep blood pressure under 130/80, we didn't discuss this at visit - it occurred to me afterwards it would be good for you.  -TYPE 2 DIABETES MELLITUS: Type 2 Diabetes Mellitus is a condition where your body does not use insulin properly, leading to high blood sugar levels. Your A1c levels are currently averaging around 7.5-8%, which is higher than the target of below 7%. We discussed reducing your rice intake and increasing your Mounjaro dose to 7.5 mg after two more weeks to improve glucose control. Continue taking metformin and monitor your blood glucose levels, especially after meals. We will recheck your A1c in one month.  -CHRONIC KIDNEY DISEASE: Chronic Kidney Disease is a condition where your kidneys are damaged and cannot filter blood as well as they should. This is secondary to your diabetes and multiple myeloma. Continue taking Marcelline Deist as prescribed to protect your kidney function and prevent further damage.  -MULTIPLE MYELOMA: Multiple Myeloma is a type of blood cancer that affects plasma cells in your bone marrow. You are currently managing it with Revlimid and have not experienced nausea, so you can discontinue Compazine. Continue taking Revlimid as prescribed to prevent disease progression and complications.  -RISK OF THROMBOEMBOLISM: You have  an increased risk of blood clots due to multiple myeloma. To reduce this risk and prevent heart attacks, start taking aspirin 81 mg daily.  -HYPERLIPIDEMIA: Hyperlipidemia is a condition where you have high levels of cholesterol in your blood, which can increase the risk of heart disease. Continue taking rosuvastatin 10 mg daily to control your cholesterol levels and prevent cardiovascular complications.  -GENERAL HEALTH MAINTENANCE: We will order a CT coronary calcium score to assess your cardiovascular risk. This test helps [name] understand the amount of calcium in your heart's arteries, which can indicate the presence of heart disease.  INSTRUCTIONS:  Follow-up in one month to recheck your A1c levels and review the results of your CT coronary calcium score. Please ensure you complete the CT coronary calcium score before your next visit.   NEXT STEPS: [x]  Early Intervention: Schedule sooner appointment, call our on-call services, or go to emergency room if there is any significant Increase in pain or discomfort New or worsening symptoms Sudden or severe changes in your health [x]  Flexible Follow-Up: We recommend a Return in about 1 month (around 03/21/2024) for chronic disease monitoring and management. for optimal routine care. This allows for progress monitoring and treatment adjustments. [x]  Preventive Care: Schedule your annual preventive care visit! It's typically covered by insurance and helps identify potential health issues early. [x]  Lab & X-ray Appointments: Incomplete tests scheduled today, or call to schedule. X-rays: [facility] (M-F, 8:30am-noon or 1pm-5pm). [x]  Medical Information Release: Sign a release form at front desk to obtain relevant medical information we don't have.  MAKING THE MOST OF OUR FOCUSED 20 MINUTE APPOINTMENTS: [x]   Clearly state your top concerns at the beginning of the visit  to focus our discussion [x]   If you anticipate you will need more  time, please inform the front desk during scheduling - we can book multiple appointments in the same week. [x]   If you have transportation problems- use our convenient video appointments or ask about transportation support. [x]   We can get down to business faster if you use MyChart to update information before the visit and submit non-urgent questions before your visit. Thank you for taking the time to provide details through MyChart.  Let our nurse know and she can import this information into your encounter documents.  Arrival and Wait Times: [x]   Arriving on time ensures that everyone receives prompt attention. [x]   Early morning (8a) and afternoon (1p) appointments tend to have shortest wait times. [x]   Unfortunately, we cannot delay appointments for late arrivals or hold slots during phone calls.  Getting Answers and Following Up [x]   Simple Questions & Concerns: For quick questions or basic follow-up after your visit, reach [name] at [phone] or MyChart messaging. [x]   Complex Concerns: If your concern is more complex, scheduling an appointment might be best. Discuss this with the staff to find the most suitable option. [x]   Lab & Imaging Results: We'll contact you directly if results are abnormal or you don't use MyChart. Most normal results will be on MyChart within 2-3 business days, with a review message from Dr. Jon Billings. Haven't heard back in 2 weeks? Need results sooner? Contact us at [phone]. [x]   Referrals: Our referral coordinator will manage specialist referrals. The specialist's office should contact you within 2 weeks to schedule an appointment. Call us if you haven't heard from them after 2 weeks.  Staying Connected [x]   MyChart: Activate your MyChart for the fastest way to access results and message [name]. See the last page of this paperwork for instructions on how to activate.  Bring to Your Next Appointment [x]   Medications: Please bring all your medication bottles to  your next appointment to ensure we have an accurate record of your prescriptions. [x]   Health Diaries: If you're monitoring any health conditions at home, keeping a diary of your readings can be very helpful for discussions at your next appointment.  Billing [x]   X-ray & Lab Orders: These are billed by separate companies. Contact the invoicing company directly for questions or concerns. [x]   Visit Charges: Discuss any billing inquiries with our administrative services team.  Your Satisfaction Matters [x]   Share Your Experience: We strive for your satisfaction! If you have any complaints, or preferably compliments, please let Dr. Jon Billings know directly or contact our Practice Administrators, Edwena Felty or [employer], by asking at the front desk.   Reviewing Your Records [x]   Review this early draft of your clinical encounter notes below and the final encounter summary tomorrow on MyChart after its been completed.  All orders placed so far are visible here: Type 2 diabetes mellitus with hyperglycemia, with long-term current use of insulin (HCC) -     CT CARDIAC SCORING (SELF PAY ONLY); Future -     Tirzepatide; Inject 7.5 mg into the skin once a week. Start after 4 weeks total on 5 mg weekly dose.  Dispense: 6 mL; Refill: 3  Multiple myeloma not having achieved remission (HCC) -     Aspirin; Take 1 tablet (81 mg total) by mouth daily. Swallow whole.  Dispense: 90 tablet; Refill: 3

## 2024-02-22 NOTE — Assessment & Plan Note (Signed)
 Normal gfr, just microalbuminuria, due to Myeloma and diabetes with proteinuria, improved with jardiance Nephrologist: gfr good, not yet needed  Medicines:     ARB/ACEI:   adding losartan    SGLT2 inhibitor:   jardiance  Imaging:  Labwork:  Creatinine (mg/dL)  Date Value  16/09/9603 0.85  04/10/2023 0.78  10/10/2022 0.56 (L)  09/11/2022 0.64  08/15/2022 0.55 (L)  08/08/2022 0.70  08/01/2022 0.77  07/25/2022 0.63  07/18/2022 0.67  07/11/2022 0.59 (L)  06/27/2022 0.58 (L)  06/20/2022 0.62  06/13/2022 0.75  06/06/2022 0.70  05/21/2022 0.86  01/11/2021 0.82  01/04/2021 1.12  01/01/2021 1.06   Creatinine, Ser (mg/dL)  Date Value  54/08/8118 0.79  12/18/2023 0.69  12/09/2023 0.75  11/20/2023 0.77  10/23/2023 0.76  09/25/2023 0.78  09/09/2023 0.71  08/28/2023 0.71  07/31/2023 0.65  07/03/2023 0.86  06/09/2023 0.68  06/05/2023 0.84  05/08/2023 0.73  04/24/2023 0.71  03/27/2023 0.82  03/13/2023 0.72  02/27/2023 0.72  02/13/2023 0.63  01/30/2023 0.76  01/16/2023 0.74  01/02/2023 0.69  12/19/2022 0.82  12/05/2022 0.67  11/21/2022 0.69  11/07/2022 0.64  08/12/2022 0.61  05/20/2022 0.72  05/19/2022 0.68  05/18/2022 0.88  05/18/2022 0.72  05/17/2022 0.75  05/16/2022 0.78  05/16/2022 0.86  05/16/2022 1.00  05/15/2022 1.00  05/14/2022 0.90  05/13/2022 1.02  05/13/2022 0.97   GFR (mL/min)  Date Value  12/09/2023 100.57  09/09/2023 102.43  06/09/2023 103.96  08/12/2022 108.05   No results found for: "EGFR"  Lab Results  Component Value Date   VD25OH 16.61 (L) 01/01/2021   NA 138 02/12/2024   K 2.9 (L) 02/12/2024   CL 102 02/12/2024   CO2 28 02/12/2024   CALCIUM 9.1 02/12/2024   PROT 6.6 02/12/2024   ALBUMIN 4.2 02/12/2024   HGB 15.3 02/12/2024   HCT 45.6 02/12/2024   HGBA1C 8.4 (H) 12/09/2023   TOTALPROTELP 6.2 02/12/2024   Lab Results  Component Value Date   COLORURINE STRAW (A) 05/15/2022   APPEARANCEUR CLEAR 05/15/2022   LABSPEC  1.018 05/15/2022   PHURINE 6.0 05/15/2022   GLUCOSEU >=500 (A) 05/15/2022   HGBUR NEGATIVE 05/15/2022   BILIRUBINUR NEGATIVE 05/15/2022   KETONESUR NEGATIVE 05/15/2022   PROTEINUR NEGATIVE 05/15/2022   NITRITE NEGATIVE 05/15/2022   LEUKOCYTESUR NEGATIVE 05/15/2022   MICROALBUR 12.2 (H) 12/09/2023

## 2024-02-23 ENCOUNTER — Other Ambulatory Visit: Payer: Self-pay

## 2024-02-26 ENCOUNTER — Inpatient Hospital Stay: Payer: 59

## 2024-02-26 VITALS — BP 142/83 | HR 77 | Temp 98.0°F | Resp 17 | Ht 66.0 in | Wt 176.8 lb

## 2024-02-26 DIAGNOSIS — Z5112 Encounter for antineoplastic immunotherapy: Secondary | ICD-10-CM | POA: Diagnosis not present

## 2024-02-26 DIAGNOSIS — C9001 Multiple myeloma in remission: Secondary | ICD-10-CM

## 2024-02-26 DIAGNOSIS — Z7189 Other specified counseling: Secondary | ICD-10-CM

## 2024-02-26 LAB — COMPREHENSIVE METABOLIC PANEL
ALT: 21 U/L (ref 0–44)
AST: 13 U/L — ABNORMAL LOW (ref 15–41)
Albumin: 4 g/dL (ref 3.5–5.0)
Alkaline Phosphatase: 52 U/L (ref 38–126)
Anion gap: 6 (ref 5–15)
BUN: 9 mg/dL (ref 6–20)
CO2: 26 mmol/L (ref 22–32)
Calcium: 8.9 mg/dL (ref 8.9–10.3)
Chloride: 105 mmol/L (ref 98–111)
Creatinine, Ser: 0.69 mg/dL (ref 0.61–1.24)
GFR, Estimated: >60 mL/min (ref >60)
Glucose, Bld: 207 mg/dL — ABNORMAL HIGH (ref 70–99)
Potassium: 3.4 mmol/L — ABNORMAL LOW (ref 3.5–5.1)
Sodium: 137 mmol/L (ref 135–145)
Total Bilirubin: 0.8 mg/dL (ref 0.0–1.2)
Total Protein: 6.3 g/dL — ABNORMAL LOW (ref 6.5–8.1)

## 2024-02-26 LAB — CBC WITH DIFFERENTIAL (CANCER CENTER ONLY)
Abs Immature Granulocytes: 0.02 10*3/uL (ref 0.00–0.07)
Basophils Absolute: 0.1 10*3/uL (ref 0.0–0.1)
Basophils Relative: 1 %
Eosinophils Absolute: 0.4 10*3/uL (ref 0.0–0.5)
Eosinophils Relative: 6 %
HCT: 44.8 % (ref 39.0–52.0)
Hemoglobin: 14.9 g/dL (ref 13.0–17.0)
Immature Granulocytes: 0 %
Lymphocytes Relative: 38 %
Lymphs Abs: 2.5 10*3/uL (ref 0.7–4.0)
MCH: 26.6 pg (ref 26.0–34.0)
MCHC: 33.3 g/dL (ref 30.0–36.0)
MCV: 80 fL (ref 80.0–100.0)
Monocytes Absolute: 0.5 10*3/uL (ref 0.1–1.0)
Monocytes Relative: 8 %
Neutro Abs: 3 10*3/uL (ref 1.7–7.7)
Neutrophils Relative %: 47 %
Platelet Count: 184 10*3/uL (ref 150–400)
RBC: 5.6 MIL/uL (ref 4.22–5.81)
RDW: 16.1 % — ABNORMAL HIGH (ref 11.5–15.5)
WBC Count: 6.5 10*3/uL (ref 4.0–10.5)
nRBC: 0 % (ref 0.0–0.2)

## 2024-02-26 MED ORDER — SODIUM CHLORIDE 0.9 % IV SOLN
16.0000 mg/kg | Freq: Once | INTRAVENOUS | Status: AC
  Administered 2024-02-26: 1300 mg via INTRAVENOUS
  Filled 2024-02-26: qty 5

## 2024-02-26 MED ORDER — ACETAMINOPHEN 325 MG PO TABS
650.0000 mg | ORAL_TABLET | Freq: Once | ORAL | Status: AC
  Administered 2024-02-26: 650 mg via ORAL
  Filled 2024-02-26: qty 2

## 2024-02-26 MED ORDER — DIPHENHYDRAMINE HCL 25 MG PO CAPS
50.0000 mg | ORAL_CAPSULE | Freq: Once | ORAL | Status: AC
  Administered 2024-02-26: 50 mg via ORAL
  Filled 2024-02-26: qty 2

## 2024-02-26 MED ORDER — FAMOTIDINE IN NACL 20-0.9 MG/50ML-% IV SOLN
20.0000 mg | Freq: Once | INTRAVENOUS | Status: AC
  Administered 2024-02-26: 20 mg via INTRAVENOUS
  Filled 2024-02-26: qty 50

## 2024-02-26 MED ORDER — MONTELUKAST SODIUM 10 MG PO TABS
10.0000 mg | ORAL_TABLET | Freq: Once | ORAL | Status: AC
  Administered 2024-02-26: 10 mg via ORAL
  Filled 2024-02-26: qty 1

## 2024-02-26 MED ORDER — DEXAMETHASONE SODIUM PHOSPHATE 10 MG/ML IJ SOLN
2.0000 mg | Freq: Once | INTRAMUSCULAR | Status: AC
  Administered 2024-02-26: 2 mg via INTRAVENOUS
  Filled 2024-02-26: qty 1

## 2024-02-26 MED ORDER — SODIUM CHLORIDE 0.9 % IV SOLN: Freq: Once | INTRAVENOUS | Status: AC

## 2024-02-26 NOTE — Addendum Note (Signed)
 Addended by: Michel Santee on: 02/26/2024 12:03 PM   Modules accepted: Orders

## 2024-02-26 NOTE — Patient Instructions (Signed)
 [facility] - A DEPT OF [facility]   Discharge Instructions: Thank you for choosing [facility] to provide your oncology and hematology care.   If you have a lab appointment with the Cancer Center, please go directly to the Cancer Center and check in at the registration area.   Wear comfortable clothing and clothing appropriate for easy access to any Portacath or PICC line.   We strive to give you quality time with your provider. You may need to reschedule your appointment if you arrive late (15 or more minutes).  Arriving late affects you and other patients whose appointments are after yours.  Also, if you miss three or more appointments without notifying the office, you may be dismissed from the clinic at the provider's discretion.      For prescription refill requests, have your pharmacy contact our office and allow 72 hours for refills to be completed.    Today you received the following chemotherapy and/or immunotherapy agents: Daratumumab (Darzalex)      To help prevent nausea and vomiting after your treatment, we encourage you to take your nausea medication as directed.  BELOW ARE SYMPTOMS THAT SHOULD BE REPORTED IMMEDIATELY: *FEVER GREATER THAN 100.4 F (38 C) OR HIGHER *CHILLS OR SWEATING *NAUSEA AND VOMITING THAT IS NOT CONTROLLED WITH YOUR NAUSEA MEDICATION *UNUSUAL SHORTNESS OF BREATH *UNUSUAL BRUISING OR BLEEDING *URINARY PROBLEMS (pain or burning when urinating, or frequent urination) *BOWEL PROBLEMS (unusual diarrhea, constipation, pain near the anus) TENDERNESS IN MOUTH AND THROAT WITH OR WITHOUT PRESENCE OF ULCERS (sore throat, sores in mouth, or a toothache) UNUSUAL RASH, SWELLING OR PAIN  UNUSUAL VAGINAL DISCHARGE OR ITCHING   Items with * indicate a potential emergency and should be followed up as soon as possible or go to the Emergency Department if any problems should occur.  Please show the CHEMOTHERAPY ALERT CARD or  IMMUNOTHERAPY ALERT CARD at check-in to the Emergency Department and triage nurse.  Should you have questions after your visit or need to cancel or reschedule your appointment, please contact [facility] - A DEPT [facility]  Dept: [phone]  and follow the prompts.  Office hours are 8:00 a.m. to 4:30 p.m. Monday - Friday. Please note that voicemails left after 4:00 p.m. may not be returned until the following business day.  We are closed weekends and major holidays. You have access to a nurse at all times for urgent questions. Please call the main number to the clinic Dept: [phone] and follow the prompts.   For any non-urgent questions, you may also contact your provider using MyChart. We now offer e-Visits for anyone 5 and older to request care online for non-urgent symptoms. For details visit [email or web address].   Also download the MyChart app! Go to the app store, search "MyChart", open the app, select [facility], and log in with your MyChart username and password.

## 2024-03-02 ENCOUNTER — Other Ambulatory Visit: Payer: Self-pay | Admitting: Hematology

## 2024-03-02 DIAGNOSIS — C9000 Multiple myeloma not having achieved remission: Secondary | ICD-10-CM

## 2024-03-03 LAB — MULTIPLE MYELOMA PANEL, SERUM
Albumin SerPl Elph-Mcnc: 3.5 g/dL (ref 2.9–4.4)
Albumin/Glob SerPl: 1.5 (ref 0.7–1.7)
Alpha 1: 0.3 g/dL (ref 0.0–0.4)
Alpha2 Glob SerPl Elph-Mcnc: 0.6 g/dL (ref 0.4–1.0)
B-Globulin SerPl Elph-Mcnc: 0.9 g/dL (ref 0.7–1.3)
Gamma Glob SerPl Elph-Mcnc: 0.6 g/dL (ref 0.4–1.8)
Globulin, Total: 2.4 g/dL (ref 2.2–3.9)
IgA: 117 mg/dL (ref 90–386)
IgG (Immunoglobin G), Serum: 728 mg/dL (ref 603–1613)
IgM (Immunoglobulin M), Srm: 29 mg/dL (ref 20–172)
M Protein SerPl Elph-Mcnc: 0.3 g/dL — ABNORMAL HIGH
Total Protein ELP: 5.9 g/dL — ABNORMAL LOW (ref 6.0–8.5)

## 2024-03-04 ENCOUNTER — Encounter: Payer: Self-pay | Admitting: Hematology

## 2024-03-11 ENCOUNTER — Inpatient Hospital Stay: Payer: 59

## 2024-03-11 ENCOUNTER — Inpatient Hospital Stay: Payer: 59 | Attending: Hematology

## 2024-03-11 ENCOUNTER — Inpatient Hospital Stay: Payer: 59 | Admitting: Hematology

## 2024-03-11 VITALS — BP 131/72 | HR 66 | Temp 98.3°F | Resp 18 | Wt 172.8 lb

## 2024-03-11 DIAGNOSIS — G629 Polyneuropathy, unspecified: Secondary | ICD-10-CM | POA: Diagnosis not present

## 2024-03-11 DIAGNOSIS — Z79899 Other long term (current) drug therapy: Secondary | ICD-10-CM | POA: Diagnosis not present

## 2024-03-11 DIAGNOSIS — Z7984 Long term (current) use of oral hypoglycemic drugs: Secondary | ICD-10-CM | POA: Diagnosis not present

## 2024-03-11 DIAGNOSIS — Z7189 Other specified counseling: Secondary | ICD-10-CM

## 2024-03-11 DIAGNOSIS — D751 Secondary polycythemia: Secondary | ICD-10-CM | POA: Diagnosis not present

## 2024-03-11 DIAGNOSIS — C9000 Multiple myeloma not having achieved remission: Secondary | ICD-10-CM | POA: Diagnosis not present

## 2024-03-11 DIAGNOSIS — D649 Anemia, unspecified: Secondary | ICD-10-CM | POA: Diagnosis not present

## 2024-03-11 DIAGNOSIS — C9001 Multiple myeloma in remission: Secondary | ICD-10-CM

## 2024-03-11 DIAGNOSIS — Z5112 Encounter for antineoplastic immunotherapy: Secondary | ICD-10-CM | POA: Diagnosis present

## 2024-03-11 DIAGNOSIS — Z7985 Long-term (current) use of injectable non-insulin antidiabetic drugs: Secondary | ICD-10-CM | POA: Diagnosis not present

## 2024-03-11 LAB — COMPREHENSIVE METABOLIC PANEL
ALT: 35 U/L (ref 0–44)
AST: 16 U/L (ref 15–41)
Albumin: 4.2 g/dL (ref 3.5–5.0)
Alkaline Phosphatase: 58 U/L (ref 38–126)
Anion gap: 7 (ref 5–15)
BUN: 15 mg/dL (ref 6–20)
CO2: 25 mmol/L (ref 22–32)
Calcium: 8.6 mg/dL — ABNORMAL LOW (ref 8.9–10.3)
Chloride: 108 mmol/L (ref 98–111)
Creatinine, Ser: 0.7 mg/dL (ref 0.61–1.24)
GFR, Estimated: >60 mL/min (ref >60)
Glucose, Bld: 205 mg/dL — ABNORMAL HIGH (ref 70–99)
Potassium: 3.2 mmol/L — ABNORMAL LOW (ref 3.5–5.1)
Sodium: 140 mmol/L (ref 135–145)
Total Bilirubin: 0.8 mg/dL (ref 0.0–1.2)
Total Protein: 6.7 g/dL (ref 6.5–8.1)

## 2024-03-11 LAB — CBC WITH DIFFERENTIAL (CANCER CENTER ONLY)
Abs Immature Granulocytes: 0.02 10*3/uL (ref 0.00–0.07)
Basophils Absolute: 0.1 10*3/uL (ref 0.0–0.1)
Basophils Relative: 2 %
Eosinophils Absolute: 0.3 10*3/uL (ref 0.0–0.5)
Eosinophils Relative: 6 %
HCT: 45.9 % (ref 39.0–52.0)
Hemoglobin: 15.3 g/dL (ref 13.0–17.0)
Immature Granulocytes: 0 %
Lymphocytes Relative: 43 %
Lymphs Abs: 2.6 10*3/uL (ref 0.7–4.0)
MCH: 26.7 pg (ref 26.0–34.0)
MCHC: 33.3 g/dL (ref 30.0–36.0)
MCV: 80 fL (ref 80.0–100.0)
Monocytes Absolute: 0.7 10*3/uL (ref 0.1–1.0)
Monocytes Relative: 11 %
Neutro Abs: 2.3 10*3/uL (ref 1.7–7.7)
Neutrophils Relative %: 38 %
Platelet Count: 193 10*3/uL (ref 150–400)
RBC: 5.74 MIL/uL (ref 4.22–5.81)
RDW: 15.9 % — ABNORMAL HIGH (ref 11.5–15.5)
WBC Count: 6.1 10*3/uL (ref 4.0–10.5)
nRBC: 0 % (ref 0.0–0.2)

## 2024-03-11 MED ORDER — SODIUM CHLORIDE 0.9 % IV SOLN: Freq: Once | INTRAVENOUS | Status: AC

## 2024-03-11 MED ORDER — FAMOTIDINE IN NACL 20-0.9 MG/50ML-% IV SOLN
20.0000 mg | Freq: Once | INTRAVENOUS | Status: AC
  Administered 2024-03-11: 20 mg via INTRAVENOUS
  Filled 2024-03-11: qty 50

## 2024-03-11 MED ORDER — ACETAMINOPHEN 325 MG PO TABS
650.0000 mg | ORAL_TABLET | Freq: Once | ORAL | Status: AC
  Administered 2024-03-11: 650 mg via ORAL
  Filled 2024-03-11: qty 2

## 2024-03-11 MED ORDER — HEPARIN SOD (PORK) LOCK FLUSH 100 UNIT/ML IV SOLN: 500.0000 [IU] | Freq: Once | INTRAVENOUS | Status: DC | PRN

## 2024-03-11 MED ORDER — ZOLEDRONIC ACID 4 MG/100ML IV SOLN
4.0000 mg | Freq: Once | INTRAVENOUS | Status: AC
  Administered 2024-03-11: 4 mg via INTRAVENOUS
  Filled 2024-03-11: qty 100

## 2024-03-11 MED ORDER — MONTELUKAST SODIUM 10 MG PO TABS
10.0000 mg | ORAL_TABLET | Freq: Once | ORAL | Status: AC
  Administered 2024-03-11: 10 mg via ORAL
  Filled 2024-03-11: qty 1

## 2024-03-11 MED ORDER — DEXAMETHASONE SODIUM PHOSPHATE 10 MG/ML IJ SOLN
2.0000 mg | Freq: Once | INTRAMUSCULAR | Status: AC
  Administered 2024-03-11: 2 mg via INTRAVENOUS
  Filled 2024-03-11: qty 1

## 2024-03-11 MED ORDER — DIPHENHYDRAMINE HCL 25 MG PO CAPS
50.0000 mg | ORAL_CAPSULE | Freq: Once | ORAL | Status: AC
  Administered 2024-03-11: 50 mg via ORAL
  Filled 2024-03-11: qty 2

## 2024-03-11 MED ORDER — SODIUM CHLORIDE 0.9 % IV SOLN
16.0000 mg/kg | Freq: Once | INTRAVENOUS | Status: AC
  Administered 2024-03-11: 1300 mg via INTRAVENOUS
  Filled 2024-03-11: qty 5

## 2024-03-11 MED ORDER — SODIUM CHLORIDE 0.9% FLUSH
10.0000 mL | INTRAVENOUS | Status: DC | PRN
Start: 2024-03-11 — End: 2024-03-11

## 2024-03-11 NOTE — Progress Notes (Signed)
 Pt given Revlimid. It is delivered to the [facility]. Pt to start taking on 03/17/24 - 04/06/24. Pt will take off 04/07/24 - 04/13/24. New cycle will start on 04/14/24. Pt aware when to start this medication.

## 2024-03-11 NOTE — Patient Instructions (Signed)
 [facility] - A DEPT OF [facility]  Discharge Instructions: Thank you for choosing [facility] to provide your oncology and hematology care.   If you have a lab appointment with the Cancer Center, please go directly to the Cancer Center and check in at the registration area.   Wear comfortable clothing and clothing appropriate for easy access to any Portacath or PICC line.   We strive to give you quality time with your provider. You may need to reschedule your appointment if you arrive late (15 or more minutes).  Arriving late affects you and other patients whose appointments are after yours.  Also, if you miss three or more appointments without notifying the office, you may be dismissed from the clinic at the provider's discretion.      For prescription refill requests, have your pharmacy contact our office and allow 72 hours for refills to be completed.    Today you received the following chemotherapy and/or immunotherapy agents: Daratumumab.       To help prevent nausea and vomiting after your treatment, we encourage you to take your nausea medication as directed.  BELOW ARE SYMPTOMS THAT SHOULD BE REPORTED IMMEDIATELY: *FEVER GREATER THAN 100.4 F (38 C) OR HIGHER *CHILLS OR SWEATING *NAUSEA AND VOMITING THAT IS NOT CONTROLLED WITH YOUR NAUSEA MEDICATION *UNUSUAL SHORTNESS OF BREATH *UNUSUAL BRUISING OR BLEEDING *URINARY PROBLEMS (pain or burning when urinating, or frequent urination) *BOWEL PROBLEMS (unusual diarrhea, constipation, pain near the anus) TENDERNESS IN MOUTH AND THROAT WITH OR WITHOUT PRESENCE OF ULCERS (sore throat, sores in mouth, or a toothache) UNUSUAL RASH, SWELLING OR PAIN  UNUSUAL VAGINAL DISCHARGE OR ITCHING   Items with * indicate a potential emergency and should be followed up as soon as possible or go to the Emergency Department if any problems should occur.  Please show the CHEMOTHERAPY ALERT CARD or  IMMUNOTHERAPY ALERT CARD at check-in to the Emergency Department and triage nurse.  Should you have questions after your visit or need to cancel or reschedule your appointment, please contact [facility] - A DEPT OF [facility]  Dept: [phone]  and follow the prompts.  Office hours are 8:00 a.m. to 4:30 p.m. Monday - Friday. Please note that voicemails left after 4:00 p.m. may not be returned until the following business day.  We are closed weekends and major holidays. You have access to a nurse at all times for urgent questions. Please call the main number to the clinic Dept: [phone] and follow the prompts.   For any non-urgent questions, you may also contact your provider using MyChart. We now offer e-Visits for anyone 55 and older to request care online for non-urgent symptoms. For details visit [email or web address].   Also download the MyChart app! Go to the app store, search "MyChart", open the app, select , and log in with your MyChart username and password.  Zoledronic Acid Injection (Cancer) What is this medication? ZOLEDRONIC ACID (ZOE le dron ik AS id) treats high calcium levels in the blood caused by cancer. It may also be used with chemotherapy to treat weakened bones caused by cancer. It works by slowing down the release of calcium from bones. This lowers calcium levels in your blood. It also makes your bones stronger and less likely to break (fracture). It belongs to a group of medications called bisphosphonates. This medicine may be used for other purposes; ask your health care  provider or pharmacist if you have questions. COMMON BRAND NAME(S): Zometa, Zometa Powder What should I tell my care team before I take this medication? They need to know if you have any of these conditions: Dehydration Dental disease Kidney disease Liver disease Low levels of calcium in the blood Lung or breathing disease, such as asthma Receiving  steroids, such as dexamethasone or prednisone An unusual or allergic reaction to zoledronic acid, other medications, foods, dyes, or preservatives Pregnant or trying to get pregnant Breast-feeding How should I use this medication? This medication is injected into a vein. It is given by your care team in a hospital or clinic setting. Talk to your care team about the use of this medication in children. Special care may be needed. Overdosage: If you think you have taken too much of this medicine contact a poison control center or emergency room at once. NOTE: This medicine is only for you. Do not share this medicine with others. What if I miss a dose? Keep appointments for follow-up doses. It is important not to miss your dose. Call your care team if you are unable to keep an appointment. What may interact with this medication? Certain antibiotics given by injection Diuretics, such as bumetanide, furosemide NSAIDs, medications for pain and inflammation, such as ibuprofen or naproxen Teriparatide Thalidomide This list may not describe all possible interactions. Give your health care provider a list of all the medicines, herbs, non-prescription drugs, or dietary supplements you use. Also tell them if you smoke, drink alcohol, or use illegal drugs. Some items may interact with your medicine. What should I watch for while using this medication? Visit your care team for regular checks on your progress. It may be some time before you see the benefit from this medication. Some people who take this medication have severe bone, joint, or muscle pain. This medication may also increase your risk for jaw problems or a broken thigh bone. Tell your care team right away if you have severe pain in your jaw, bones, joints, or muscles. Tell you care team if you have any pain that does not go away or that gets worse. Tell your dentist and dental surgeon that you are taking this medication. You should not have major  dental surgery while on this medication. See your dentist to have a dental exam and fix any dental problems before starting this medication. Take good care of your teeth while on this medication. Make sure you see your dentist for regular follow-up appointments. You should make sure you get enough calcium and vitamin D while you are taking this medication. Discuss the foods you eat and the vitamins you take with your care team. Check with your care team if you have severe diarrhea, nausea, and vomiting, or if you sweat a lot. The loss of too much body fluid may make it dangerous for you to take this medication. You may need bloodwork while taking this medication. Talk to your care team if you wish to become pregnant or think you might be pregnant. This medication can cause serious birth defects. What side effects may I notice from receiving this medication? Side effects that you should report to your care team as soon as possible: Allergic reactions--skin rash, itching, hives, swelling of the face, lips, tongue, or throat Kidney injury--decrease in the amount of urine, swelling of the ankles, hands, or feet Low calcium level--muscle pain or cramps, confusion, tingling, or numbness in the hands or feet Osteonecrosis of the jaw--pain, swelling, or  redness in the mouth, numbness of the jaw, poor healing after dental work, unusual discharge from the mouth, visible bones in the mouth Severe bone, joint, or muscle pain Side effects that usually do not require medical attention (report to your care team if they continue or are bothersome): Constipation Fatigue Fever Loss of appetite Nausea Stomach pain This list may not describe all possible side effects. Call your doctor for medical advice about side effects. You may report side effects to FDA at 1-800-FDA-1088. Where should I keep my medication? This medication is given in a hospital or clinic. It will not be stored at home. NOTE: This sheet is a  summary. It may not cover all possible information. If you have questions about this medicine, talk to your doctor, pharmacist, or health care provider.  2024 [employer] Standard (2022-02-07 00:00:00)

## 2024-03-11 NOTE — Progress Notes (Signed)
 Per Candise Che MD, ok to give Zometa with Calcium 8.6

## 2024-03-14 LAB — MULTIPLE MYELOMA PANEL, SERUM
Albumin SerPl Elph-Mcnc: 3.6 g/dL (ref 2.9–4.4)
Albumin/Glob SerPl: 1.3 (ref 0.7–1.7)
Alpha 1: 0.2 g/dL (ref 0.0–0.4)
Alpha2 Glob SerPl Elph-Mcnc: 0.9 g/dL (ref 0.4–1.0)
B-Globulin SerPl Elph-Mcnc: 1 g/dL (ref 0.7–1.3)
Gamma Glob SerPl Elph-Mcnc: 0.7 g/dL (ref 0.4–1.8)
Globulin, Total: 2.8 g/dL (ref 2.2–3.9)
IgA: 155 mg/dL (ref 90–386)
IgG (Immunoglobin G), Serum: 729 mg/dL (ref 603–1613)
IgM (Immunoglobulin M), Srm: 26 mg/dL (ref 20–172)
M Protein SerPl Elph-Mcnc: 0.3 g/dL — ABNORMAL HIGH
Total Protein ELP: 6.4 g/dL (ref 6.0–8.5)

## 2024-03-16 ENCOUNTER — Other Ambulatory Visit: Payer: Self-pay

## 2024-03-21 ENCOUNTER — Ambulatory Visit (INDEPENDENT_AMBULATORY_CARE_PROVIDER_SITE_OTHER): Payer: 59 | Admitting: Internal Medicine

## 2024-03-21 ENCOUNTER — Encounter: Payer: Self-pay | Admitting: Internal Medicine

## 2024-03-21 VITALS — BP 110/80 | HR 96 | Temp 97.6°F | Ht 66.0 in | Wt 169.0 lb

## 2024-03-21 DIAGNOSIS — Z7984 Long term (current) use of oral hypoglycemic drugs: Secondary | ICD-10-CM | POA: Diagnosis not present

## 2024-03-21 DIAGNOSIS — Z603 Acculturation difficulty: Secondary | ICD-10-CM

## 2024-03-21 DIAGNOSIS — I10 Essential (primary) hypertension: Secondary | ICD-10-CM

## 2024-03-21 DIAGNOSIS — Z7985 Long-term (current) use of injectable non-insulin antidiabetic drugs: Secondary | ICD-10-CM

## 2024-03-21 DIAGNOSIS — E1165 Type 2 diabetes mellitus with hyperglycemia: Secondary | ICD-10-CM | POA: Diagnosis not present

## 2024-03-21 DIAGNOSIS — E876 Hypokalemia: Secondary | ICD-10-CM

## 2024-03-21 DIAGNOSIS — C9001 Multiple myeloma in remission: Secondary | ICD-10-CM

## 2024-03-21 DIAGNOSIS — Z758 Other problems related to medical facilities and other health care: Secondary | ICD-10-CM

## 2024-03-21 DIAGNOSIS — Z794 Long term (current) use of insulin: Secondary | ICD-10-CM | POA: Diagnosis not present

## 2024-03-21 DIAGNOSIS — G609 Hereditary and idiopathic neuropathy, unspecified: Secondary | ICD-10-CM

## 2024-03-21 LAB — POCT GLYCOSYLATED HEMOGLOBIN (HGB A1C): Hemoglobin A1C: 7 % — AB (ref 4.0–5.6)

## 2024-03-21 MED ORDER — TIRZEPATIDE 10 MG/0.5ML ~~LOC~~ SOAJ: 10.0000 mg | SUBCUTANEOUS | 3 refills | Status: DC

## 2024-03-21 MED ORDER — DAPAGLIFLOZIN PROPANEDIOL 10 MG PO TABS: 10.0000 mg | ORAL_TABLET | Freq: Every day | ORAL | 3 refills | Status: DC

## 2024-03-21 MED ORDER — METFORMIN HCL 500 MG PO TABS: 1000.0000 mg | ORAL_TABLET | Freq: Two times a day (BID) | ORAL | 3 refills | Status: DC

## 2024-03-21 NOTE — Assessment & Plan Note (Signed)
 Assessment: Persistent hypokalemia with recent potassium level of 3.2 mEq/L (03/11/2024). Currently on potassium chloride 20 mEq twice daily. Plan: Continue potassium chloride 20 mEq twice daily Reinforce dietary sources of potassium Recheck levels at next visit Consider caution with diuretic dose adjustment if needed in future

## 2024-03-21 NOTE — Assessment & Plan Note (Signed)
 Assessment: Patient speaks Donald Suarez ([place] dialect) with limited [place] proficiency. Interpreter services utilized effectively for today's visit. Plan: Continue to arrange interpreter services for all future appointments Provide translated written instructions when available Avoid online/virtual appointments when possible to ensure clear communication Consider pictorial medication instructions

## 2024-03-21 NOTE — Assessment & Plan Note (Signed)
 Assessment: Likely multifactorial, related to both diabetes and Revlimid therapy. Currently managed with gabapentin 300 mg three times daily. Plan: Continue gabapentin 300 mg three times daily Monitor symptoms and efficacy Improved glycemic control may help reduce diabetes component

## 2024-03-21 NOTE — Progress Notes (Signed)
 ==============================  [facility]: [phone]   -- Medical Office Visit --  Patient: Donald Suarez      Age: 58 y.o.       Sex:  male  Date:   03/21/2024 Today's Healthcare Provider: Lula Olszewski, MD  ==============================   Chief Complaint: 1 month follow-up Diabetes management.  **Discussed the use of AI scribe software for clinical note transcription with the patient, who gave verbal consent to proceed.  History of Present Illness Zenas Favaro is a 58 year old male with diabetes who presents with fluctuating glucose levels.  He experiences fluctuations in his blood glucose levels, with readings ranging from 130 to 260 mg/dL. His glucose was 146 mg/dL yesterday afternoon. He uses a glucose meter to monitor his levels, although he forgot to bring it to the appointment.  He is taking Mounjaro injections, metformin, and Farxiga for diabetes management. He takes metformin as two tablets twice a day, before breakfast and dinner, and Farxiga once a day. He is currently on a 7.5 mg dose of Mounjaro, administered weekly on Sundays.  His hemoglobin A1c has been running between 8 to 9%.  He mentions a dietary challenge with rice consumption, indicating difficulty in avoiding it.  Lab Results  Component Value Date   HGBA1C 7.0 (A) 03/21/2024   HGBA1C 8.4 (H) 12/09/2023   HGBA1C 8.9 (H) 09/09/2023     Background: This is a 58 y.o. male who has Multiple myeloma in remission (HCC); Counseling regarding advance care planning and goals of care; Hypoalbuminemia; Hyperviscosity; Type 2 diabetes mellitus with hyperglycemia (HCC); Chronic apical periodontitis; Hyperproteinemia; Blurry vision; Hypomagnesemia; Elevated ferritin; Microalbuminuria; Hypocalcemia; Peripheral neuropathy; Language barrier affecting health care; Hypokalemia; History of dental problems; At high risk for venous thromboembolism; Diabetes mellitus with proteinuria (HCC);  Hypertension; and Chronic kidney disease on their problem list.  Visually reviewed that patient  has a past medical history of Abfraction (08/06/2022), Accretions on teeth (08/06/2022), Anemia, Attrition, teeth excessive (08/06/2022), Caries (08/06/2022), Defective dental restoration (08/06/2022), Elevated ferritin (08/07/2022), Hypocalcemia (08/19/2022), Loose, teeth (08/06/2022), Malocclusion (08/06/2022), Microalbuminuria (08/19/2022), Periodontal disease (08/06/2022), Peripheral neuropathy (09/08/2022), and Teeth missing (08/06/2022). Manually updated: No problems updated. Verbally reviewed (per Abridge-generated extraction):  Medications: Visually reviewed/updated: Current Outpatient Medications on File Prior to Visit  Medication Sig   Accu-Chek Softclix Lancets lancets Use to test blood sugars up to 4 times daily as directed.   acyclovir (ZOVIRAX) 400 MG tablet Take 1 tablet (400 mg total) by mouth 2 (two) times daily.   aspirin EC 81 MG tablet Take 1 tablet (81 mg total) by mouth daily. Swallow whole.   Blood Glucose Monitoring Suppl (ACCU-CHEK GUIDE) w/Device KIT Use to test blood sugars up to 4 times daily as directed.   Continuous Glucose Receiver (DEXCOM G7 RECEIVER) DEVI 1 Device by Does not apply route continuous.   Continuous Glucose Sensor (DEXCOM G7 SENSOR) MISC 1 each by Does not apply route continuous. Change sensor every ten days.   Cyanocobalamin (B-12) 1000 MCG CAPS Take 1 tablet by mouth daily.   gabapentin (NEURONTIN) 300 MG capsule Take 1 capsule (300 mg total) by mouth 3 (three) times daily.   glucose blood (ACCU-CHEK GUIDE) test strip Use to test blood sugars up to 4 times daily as directed.   lenalidomide (REVLIMID) 25 MG capsule TAKE 1 CAPSULE BY MOUTH 1 TIME A DAY FOR 21 DAYS ON THEN 7 DAYS OFF   losartan (COZAAR) 25 MG tablet Take 1 tablet (25 mg  total) by mouth daily. Start taking if blood pressure is over 130/80.  Protects kidneys and lowers blood pressure.    potassium chloride SA (KLOR-CON M20) 20 MEQ tablet Take 1 tablet (20 mEq total) by mouth 2 (two) times daily.   rosuvastatin (CRESTOR) 10 MG tablet Take 1 tablet (10 mg total) by mouth daily.   Current Facility-Administered Medications on File Prior to Visit  Medication   clotrimazole (LOTRIMIN) 1 % cream   Medications Discontinued During This Encounter  Medication Reason   metFORMIN (GLUCOPHAGE) 500 MG tablet    dapagliflozin propanediol (FARXIGA) 10 MG TABS tablet    tirzepatide [facility]) 7.5 MG/0.5ML Pen        Physical Exam:    03/21/2024    8:41 AM 03/11/2024    2:29 PM 03/11/2024   12:36 PM  Vitals with BMI  Height 5\' 6"     Weight 169 lbs  172 lbs 13 oz  BMI 27.29  27.9  Systolic 110 131 540  Diastolic 80 72 84  Pulse 96 66 67   Wt Readings from Last 10 Encounters:  03/21/24 169 lb (76.7 kg)  03/11/24 172 lb 12.8 oz (78.4 kg)  02/26/24 176 lb 12 oz (80.2 kg)  02/22/24 178 lb 6.4 oz (80.9 kg)  02/12/24 170 lb 14.4 oz (77.5 kg)  01/20/24 177 lb 6.4 oz (80.5 kg)  01/15/24 172 lb 11.2 oz (78.3 kg)  12/18/23 179 lb 8 oz (81.4 kg)  12/09/23 181 lb (82.1 kg)  11/20/23 179 lb 12.8 oz (81.6 kg)  Vital signs reviewed.  Nursing notes reviewed. Weight trend reviewed. General Appearance:  No acute distress appreciable.   Well-groomed, healthy-appearing male.  Well proportioned with no abnormal fat distribution.  Good muscle tone. Pulmonary:  Normal work of breathing at rest, no respiratory distress apparent. SpO2: 96 %  Musculoskeletal: All extremities are intact.  Neurological:  Awake, alert, oriented, and engaged.  No obvious focal neurological deficits or cognitive impairments.  Sensorium seems unclouded.   Speech is clear and coherent with logical content. Psychiatric:  Appropriate mood, pleasant and cooperative demeanor, thoughtful and engaged during the exam    Results for orders placed or performed in visit on 03/21/24  POCT HgB A1C  Result Value Ref Range    Hemoglobin A1C 7.0 (A) 4.0 - 5.6 %   Office Visit on 03/21/2024  Component Date Value   Hemoglobin A1C 03/21/2024 7.0 (A)   Appointment on 03/11/2024  Component Date Value   WBC Count 03/11/2024 6.1    RBC 03/11/2024 5.74    Hemoglobin 03/11/2024 15.3    HCT 03/11/2024 45.9    MCV 03/11/2024 80.0    MCH 03/11/2024 26.7    MCHC 03/11/2024 33.3    RDW 03/11/2024 15.9 (H)    Platelet Count 03/11/2024 193    nRBC 03/11/2024 0.0    Neutrophils Relative % 03/11/2024 38    Neutro Abs 03/11/2024 2.3    Lymphocytes Relative 03/11/2024 43    Lymphs Abs 03/11/2024 2.6    Monocytes Relative 03/11/2024 11    Monocytes Absolute 03/11/2024 0.7    Eosinophils Relative 03/11/2024 6    Eosinophils Absolute 03/11/2024 0.3    Basophils Relative 03/11/2024 2    Basophils Absolute 03/11/2024 0.1    Immature Granulocytes 03/11/2024 0    Abs Immature Granulocytes 03/11/2024 0.02    Sodium 03/11/2024 140    Potassium 03/11/2024 3.2 (L)    Chloride 03/11/2024 108    CO2 03/11/2024 25  Glucose, Bld 03/11/2024 205 (H)    BUN 03/11/2024 15    Creatinine, Ser 03/11/2024 0.70    Calcium 03/11/2024 8.6 (L)    Total Protein 03/11/2024 6.7    Albumin 03/11/2024 4.2    AST 03/11/2024 16    ALT 03/11/2024 35    Alkaline Phosphatase 03/11/2024 58    Total Bilirubin 03/11/2024 0.8    GFR, Estimated 03/11/2024 >60    Anion gap 03/11/2024 7    IgG (Immunoglobin G), Se* 03/11/2024 729    IgA 03/11/2024 155    IgM (Immunoglobulin M), * 03/11/2024 26    Total Protein ELP 03/11/2024 6.4    Albumin SerPl Elph-Mcnc 03/11/2024 3.6    Alpha 1 03/11/2024 0.2    Alpha2 Glob SerPl Elph-M* 03/11/2024 0.9    B-Globulin SerPl Elph-Mc* 03/11/2024 1.0    Gamma Glob SerPl Elph-Mc* 03/11/2024 0.7    M Protein SerPl Elph-Mcnc 03/11/2024 0.3 (H)    Globulin, Total 03/11/2024 2.8    Albumin/Glob SerPl 03/11/2024 1.3    IFE 1 03/11/2024 Comment (A)    Please Note 03/11/2024 Comment   Appointment on 02/26/2024   Component Date Value   WBC Count 02/26/2024 6.5    RBC 02/26/2024 5.60    Hemoglobin 02/26/2024 14.9    HCT 02/26/2024 44.8    MCV 02/26/2024 80.0    MCH 02/26/2024 26.6    MCHC 02/26/2024 33.3    RDW 02/26/2024 16.1 (H)    Platelet Count 02/26/2024 184    nRBC 02/26/2024 0.0    Neutrophils Relative % 02/26/2024 47    Neutro Abs 02/26/2024 3.0    Lymphocytes Relative 02/26/2024 38    Lymphs Abs 02/26/2024 2.5    Monocytes Relative 02/26/2024 8    Monocytes Absolute 02/26/2024 0.5    Eosinophils Relative 02/26/2024 6    Eosinophils Absolute 02/26/2024 0.4    Basophils Relative 02/26/2024 1    Basophils Absolute 02/26/2024 0.1    Immature Granulocytes 02/26/2024 0    Abs Immature Granulocytes 02/26/2024 0.02    Sodium 02/26/2024 137    Potassium 02/26/2024 3.4 (L)    Chloride 02/26/2024 105    CO2 02/26/2024 26    Glucose, Bld 02/26/2024 207 (H)    BUN 02/26/2024 9    Creatinine, Ser 02/26/2024 0.69    Calcium 02/26/2024 8.9    Total Protein 02/26/2024 6.3 (L)    Albumin 02/26/2024 4.0    AST 02/26/2024 13 (L)    ALT 02/26/2024 21    Alkaline Phosphatase 02/26/2024 52    Total Bilirubin 02/26/2024 0.8    GFR, Estimated 02/26/2024 >60    Anion gap 02/26/2024 6    IgG (Immunoglobin G), Se* 02/26/2024 728    IgA 02/26/2024 117    IgM (Immunoglobulin M), * 02/26/2024 29    Total Protein ELP 02/26/2024 5.9 (L)    Albumin SerPl Elph-Mcnc 02/26/2024 3.5    Alpha 1 02/26/2024 0.3    Alpha2 Glob SerPl Elph-M* 02/26/2024 0.6    B-Globulin SerPl Elph-Mc* 02/26/2024 0.9    Gamma Glob SerPl Elph-Mc* 02/26/2024 0.6    M Protein SerPl Elph-Mcnc 02/26/2024 0.3 (H)    Globulin, Total 02/26/2024 2.4    Albumin/Glob SerPl 02/26/2024 1.5    IFE 1 02/26/2024 Comment (A)    Please Note 02/26/2024 Comment   Appointment on 02/12/2024  Component Date Value   WBC Count 02/12/2024 7.0    RBC 02/12/2024 5.81    Hemoglobin 02/12/2024 15.3    HCT 02/12/2024  45.6    MCV 02/12/2024 78.5  (L)    MCH 02/12/2024 26.3    MCHC 02/12/2024 33.6    RDW 02/12/2024 15.4    Platelet Count 02/12/2024 126 (L)    nRBC 02/12/2024 0.0    Neutrophils Relative % 02/12/2024 41    Neutro Abs 02/12/2024 2.8    Lymphocytes Relative 02/12/2024 42    Lymphs Abs 02/12/2024 3.0    Monocytes Relative 02/12/2024 8    Monocytes Absolute 02/12/2024 0.5    Eosinophils Relative 02/12/2024 8    Eosinophils Absolute 02/12/2024 0.5    Basophils Relative 02/12/2024 1    Basophils Absolute 02/12/2024 0.1    Immature Granulocytes 02/12/2024 0    Abs Immature Granulocytes 02/12/2024 0.02    IgG (Immunoglobin G), Se* 02/12/2024 800    IgA 02/12/2024 108    IgM (Immunoglobulin M), * 02/12/2024 25    Total Protein ELP 02/12/2024 6.2    Albumin SerPl Elph-Mcnc 02/12/2024 3.8    Alpha 1 02/12/2024 0.2    Alpha2 Glob SerPl Elph-M* 02/12/2024 0.8    B-Globulin SerPl Elph-Mc* 02/12/2024 0.8    Gamma Glob SerPl Elph-Mc* 02/12/2024 0.7    M Protein SerPl Elph-Mcnc 02/12/2024 0.3 (H)    Globulin, Total 02/12/2024 2.4    Albumin/Glob SerPl 02/12/2024 1.6    IFE 1 02/12/2024 Comment (A)    Please Note 02/12/2024 Comment    Sodium 02/12/2024 138    Potassium 02/12/2024 2.9 (L)    Chloride 02/12/2024 102    CO2 02/12/2024 28    Glucose, Bld 02/12/2024 211 (H)    BUN 02/12/2024 11    Creatinine 02/12/2024 0.85    Calcium 02/12/2024 9.1    Total Protein 02/12/2024 6.6    Albumin 02/12/2024 4.2    AST 02/12/2024 11 (L)    ALT 02/12/2024 12    Alkaline Phosphatase 02/12/2024 50    Total Bilirubin 02/12/2024 0.7    GFR, Estimated 02/12/2024 >60    Anion gap 02/12/2024 8   Appointment on 01/15/2024  Component Date Value   WBC Count 01/15/2024 11.5 (H)    RBC 01/15/2024 5.67    Hemoglobin 01/15/2024 15.0    HCT 01/15/2024 45.3    MCV 01/15/2024 79.9 (L)    MCH 01/15/2024 26.5    MCHC 01/15/2024 33.1    RDW 01/15/2024 16.2 (H)    Platelet Count 01/15/2024 172    nRBC 01/15/2024 0.0    Neutrophils  Relative % 01/15/2024 54    Neutro Abs 01/15/2024 6.3    Lymphocytes Relative 01/15/2024 31    Lymphs Abs 01/15/2024 3.5    Monocytes Relative 01/15/2024 13    Monocytes Absolute 01/15/2024 1.5 (H)    Eosinophils Relative 01/15/2024 1    Eosinophils Absolute 01/15/2024 0.1    Basophils Relative 01/15/2024 1    Basophils Absolute 01/15/2024 0.1    Immature Granulocytes 01/15/2024 0    Abs Immature Granulocytes 01/15/2024 0.04    Sodium 01/15/2024 137    Potassium 01/15/2024 3.4 (L)    Chloride 01/15/2024 104    CO2 01/15/2024 24    Glucose, Bld 01/15/2024 191 (H)    BUN 01/15/2024 13    Creatinine, Ser 01/15/2024 0.79    Calcium 01/15/2024 9.5    Total Protein 01/15/2024 7.1    Albumin 01/15/2024 4.1    AST 01/15/2024 13 (L)    ALT 01/15/2024 17    Alkaline Phosphatase 01/15/2024 70    Total Bilirubin 01/15/2024 0.6  GFR, Estimated 01/15/2024 >60    Anion gap 01/15/2024 9    IgG (Immunoglobin G), Se* 01/15/2024 784    IgA 01/15/2024 119    IgM (Immunoglobulin M), * 01/15/2024 25    Total Protein ELP 01/15/2024 6.5    Albumin SerPl Elph-Mcnc 01/15/2024 3.3    Alpha 1 01/15/2024 0.3    Alpha2 Glob SerPl Elph-M* 01/15/2024 1.2 (H)    B-Globulin SerPl Elph-Mc* 01/15/2024 1.0    Gamma Glob SerPl Elph-Mc* 01/15/2024 0.7    M Protein SerPl Elph-Mcnc 01/15/2024 0.3 (H)    Globulin, Total 01/15/2024 3.2    Albumin/Glob SerPl 01/15/2024 1.1    IFE 1 01/15/2024 Comment (A)    Please Note 01/15/2024 Comment   Appointment on 12/18/2023  Component Date Value   WBC Count 12/18/2023 10.4    RBC 12/18/2023 5.55    Hemoglobin 12/18/2023 14.4    HCT 12/18/2023 42.6    MCV 12/18/2023 76.8 (L)    MCH 12/18/2023 25.9 (L)    MCHC 12/18/2023 33.8    RDW 12/18/2023 15.7 (H)    Platelet Count 12/18/2023 384    nRBC 12/18/2023 0.0    Neutrophils Relative % 12/18/2023 73    Neutro Abs 12/18/2023 7.6    Lymphocytes Relative 12/18/2023 19    Lymphs Abs 12/18/2023 2.0    Monocytes  Relative 12/18/2023 7    Monocytes Absolute 12/18/2023 0.7    Eosinophils Relative 12/18/2023 0    Eosinophils Absolute 12/18/2023 0.0    Basophils Relative 12/18/2023 0    Basophils Absolute 12/18/2023 0.0    Immature Granulocytes 12/18/2023 1    Abs Immature Granulocytes 12/18/2023 0.10 (H)    Sodium 12/18/2023 138    Potassium 12/18/2023 3.4 (L)    Chloride 12/18/2023 103    CO2 12/18/2023 25    Glucose, Bld 12/18/2023 234 (H)    BUN 12/18/2023 9    Creatinine, Ser 12/18/2023 0.69    Calcium 12/18/2023 9.0    Total Protein 12/18/2023 6.7    Albumin 12/18/2023 3.7    AST 12/18/2023 30    ALT 12/18/2023 39    Alkaline Phosphatase 12/18/2023 100    Total Bilirubin 12/18/2023 0.5    GFR, Estimated 12/18/2023 >60    Anion gap 12/18/2023 10    IgG (Immunoglobin G), Se* 12/18/2023 584 (L)    IgA 12/18/2023 106    IgM (Immunoglobulin M), * 12/18/2023 19 (L)    Total Protein ELP 12/18/2023 6.0    Albumin SerPl Elph-Mcnc 12/18/2023 2.8 (L)    Alpha 1 12/18/2023 0.4    Alpha2 Glob SerPl Elph-M* 12/18/2023 1.5 (H)    B-Globulin SerPl Elph-Mc* 12/18/2023 0.9    Gamma Glob SerPl Elph-Mc* 12/18/2023 0.4    M Protein SerPl Elph-Mcnc 12/18/2023 0.2 (H)    Globulin, Total 12/18/2023 3.2    Albumin/Glob SerPl 12/18/2023 0.9    IFE 1 12/18/2023 Comment (A)    Please Note 12/18/2023 Comment   Lab on 12/09/2023  Component Date Value   Hgb A1c MFr Bld 12/09/2023 8.4 (H)    Mean Plasma Glucose 12/09/2023 194    eAG (mmol/L) 12/09/2023 10.8   Office Visit on 12/09/2023  Component Date Value   Ferritin 12/09/2023 211.8    WBC 12/09/2023 7.7    RBC 12/09/2023 5.92 (H)    Hemoglobin 12/09/2023 15.6    HCT 12/09/2023 47.9    MCV 12/09/2023 81.0    MCHC 12/09/2023 32.5    RDW 12/09/2023 18.2 (H)  Platelets 12/09/2023 149.0 (L)    Neutrophils Relative % 12/09/2023 60.1    Lymphocytes Relative 12/09/2023 22.5    Monocytes Relative 12/09/2023 14.2 (H)    Eosinophils Relative 12/09/2023  2.1    Basophils Relative 12/09/2023 1.1    Neutro Abs 12/09/2023 4.6    Lymphs Abs 12/09/2023 1.7    Monocytes Absolute 12/09/2023 1.1 (H)    Eosinophils Absolute 12/09/2023 0.2    Basophils Absolute 12/09/2023 0.1    Sodium 12/09/2023 135    Potassium 12/09/2023 3.3 (L)    Chloride 12/09/2023 102    CO2 12/09/2023 22    Glucose, Bld 12/09/2023 195 (H)    BUN 12/09/2023 12    Creatinine, Ser 12/09/2023 0.75    Total Bilirubin 12/09/2023 0.7    Alkaline Phosphatase 12/09/2023 61    AST 12/09/2023 32    ALT 12/09/2023 26    Total Protein 12/09/2023 6.9    Albumin 12/09/2023 4.3    GFR 12/09/2023 100.57    Calcium 12/09/2023 8.4    Cholesterol 12/09/2023 186    Triglycerides 12/09/2023 90.0    HDL 12/09/2023 48.60    VLDL 12/09/2023 18.0    LDL Cholesterol 12/09/2023 120 (H)    Total CHOL/HDL Ratio 12/09/2023 4    NonHDL 12/09/2023 137.51    Microalb, Ur 12/09/2023 12.2 (H)    Creatinine,U 12/09/2023 133.2    Microalb Creat Ratio 12/09/2023 9.1   Appointment on 11/20/2023  Component Date Value   WBC Count 11/20/2023 7.1    RBC 11/20/2023 5.78    Hemoglobin 11/20/2023 14.9    HCT 11/20/2023 45.7    MCV 11/20/2023 79.1 (L)    MCH 11/20/2023 25.8 (L)    MCHC 11/20/2023 32.6    RDW 11/20/2023 18.7 (H)    Platelet Count 11/20/2023 131 (L)    nRBC 11/20/2023 0.0    Neutrophils Relative % 11/20/2023 40    Neutro Abs 11/20/2023 2.8    Lymphocytes Relative 11/20/2023 40    Lymphs Abs 11/20/2023 2.9    Monocytes Relative 11/20/2023 10    Monocytes Absolute 11/20/2023 0.7    Eosinophils Relative 11/20/2023 8    Eosinophils Absolute 11/20/2023 0.6 (H)    Basophils Relative 11/20/2023 2    Basophils Absolute 11/20/2023 0.1    Immature Granulocytes 11/20/2023 0    Abs Immature Granulocytes 11/20/2023 0.02    Sodium 11/20/2023 139    Potassium 11/20/2023 3.7    Chloride 11/20/2023 107    CO2 11/20/2023 26    Glucose, Bld 11/20/2023 266 (H)    BUN 11/20/2023 11     Creatinine, Ser 11/20/2023 0.77    Calcium 11/20/2023 9.0    Total Protein 11/20/2023 6.5    Albumin 11/20/2023 4.1    AST 11/20/2023 14 (L)    ALT 11/20/2023 19    Alkaline Phosphatase 11/20/2023 56    Total Bilirubin 11/20/2023 0.6    GFR, Estimated 11/20/2023 >60    Anion gap 11/20/2023 6    IgG (Immunoglobin G), Se* 11/20/2023 698    IgA 11/20/2023 150    IgM (Immunoglobulin M), * 11/20/2023 30    Total Protein ELP 11/20/2023 6.0    Albumin SerPl Elph-Mcnc 11/20/2023 3.6    Alpha 1 11/20/2023 0.3    Alpha2 Glob SerPl Elph-M* 11/20/2023 0.6    B-Globulin SerPl Elph-Mc* 11/20/2023 0.9    Gamma Glob SerPl Elph-Mc* 11/20/2023 0.6    M Protein SerPl Elph-Mcnc 11/20/2023 0.2 (H)    Globulin, Total 11/20/2023 2.4  Albumin/Glob SerPl 11/20/2023 1.6    IFE 1 11/20/2023 Comment (A)    Please Note 11/20/2023 Comment   Appointment on 10/23/2023  Component Date Value   WBC Count 10/23/2023 9.2    RBC 10/23/2023 5.64    Hemoglobin 10/23/2023 14.3    HCT 10/23/2023 43.5    MCV 10/23/2023 77.1 (L)    MCH 10/23/2023 25.4 (L)    MCHC 10/23/2023 32.9    RDW 10/23/2023 19.2 (H)    Platelet Count 10/23/2023 218    nRBC 10/23/2023 0.0    Neutrophils Relative % 10/23/2023 49    Neutro Abs 10/23/2023 4.4    Lymphocytes Relative 10/23/2023 33    Lymphs Abs 10/23/2023 3.1    Monocytes Relative 10/23/2023 9    Monocytes Absolute 10/23/2023 0.9    Eosinophils Relative 10/23/2023 7    Eosinophils Absolute 10/23/2023 0.7 (H)    Basophils Relative 10/23/2023 2    Basophils Absolute 10/23/2023 0.1    Immature Granulocytes 10/23/2023 0    Abs Immature Granulocytes 10/23/2023 0.03    Sodium 10/23/2023 139    Potassium 10/23/2023 3.4 (L)    Chloride 10/23/2023 108    CO2 10/23/2023 26    Glucose, Bld 10/23/2023 230 (H)    BUN 10/23/2023 13    Creatinine, Ser 10/23/2023 0.76    Calcium 10/23/2023 9.0    Total Protein 10/23/2023 6.6    Albumin 10/23/2023 4.1    AST 10/23/2023 13 (L)     ALT 10/23/2023 20    Alkaline Phosphatase 10/23/2023 62    Total Bilirubin 10/23/2023 0.9    GFR, Estimated 10/23/2023 >60    Anion gap 10/23/2023 5    IgG (Immunoglobin G), Se* 10/23/2023 689    IgA 10/23/2023 145    IgM (Immunoglobulin M), * 10/23/2023 26    Total Protein ELP 10/23/2023 6.1    Albumin SerPl Elph-Mcnc 10/23/2023 3.6    Alpha 1 10/23/2023 0.3    Alpha2 Glob SerPl Elph-M* 10/23/2023 0.7    B-Globulin SerPl Elph-Mc* 10/23/2023 0.9    Gamma Glob SerPl Elph-Mc* 10/23/2023 0.6    M Protein SerPl Elph-Mcnc 10/23/2023 0.2 (H)    Globulin, Total 10/23/2023 2.5    Albumin/Glob SerPl 10/23/2023 1.5    IFE 1 10/23/2023 Comment (A)    Please Note 10/23/2023 Comment   There may be more visits with results that are not included.  No image results found. No results found.    Results LABS Continuous glucose monitor: 146 mg/dL (47/82/9562) Continuous glucose monitor: 260 mg/dL (13/07/6577) Continuous glucose monitor: 130 mg/dL (46/96/2952) WUX3K: 4.4% (03/21/2024)       Assessment and Plan: Assessment & Plan Type 2 diabetes mellitus with hyperglycemia, with long-term current use of insulin (HCC)     Assessment: Diabetes control showing notable improvement with today's A1c at 7.0%, down from previous range of 8.4-8.9%. Home glucose monitoring shows continued fluctuations (130-260 mg/dL), with higher readings possibly related to rice consumption. Patient is currently on Mounjaro 7.5 mg weekly, metformin 1000 mg twice daily, and Farxiga 10 mg daily. Weight trending down favorably at 169 lbs (BMI 27.29). Plan: Increase Mounjaro to 10 mg weekly, starting one week after the last 7.5 mg dose Continue Metformin 1000 mg orally twice daily (two tablets before breakfast and two before dinner) Continue Farxiga 10 mg orally once daily Congratulate patient on improved A1c, now at target range Reinforce dietary modifications, particularly regarding rice consumption Recommend continuous  glucose monitoring for more accurate tracking Follow-up in 3  months with repeat A1c   Type 2 diabetes mellitus with hyperglycemia, without long-term current use of insulin (HCC) Assessment: Stable renal function with creatinine 0.70 mg/dL (16/09/9603) and eGFR >54 mL/min. Microalbuminuria present (last measured 12.2 mg/g on 12/09/2023). Kidney protection measures include Farxiga (SGLT2 inhibitor) and losartan (ARB). Plan: Continue losartan 25 mg daily for renoprotection Continue Farxiga for dual glycemic and renal benefits Monitor electrolytes, particularly given history of hypokalemia Repeat urinary microalbumin in 6 months   Long-term current use of injectable noninsulin antidiabetic medication  Long term current use of oral hypoglycemic drug  Hypertension, unspecified type Assessment: Well-controlled today with blood pressure 110/80 mmHg. Currently on losartan 25 mg daily. Plan: Continue losartan 25 mg daily Encourage home BP monitoring Continue lifestyle modifications including dietary approaches and weight management Hypokalemia Assessment: Persistent hypokalemia with recent potassium level of 3.2 mEq/L (03/11/2024). Currently on potassium chloride 20 mEq twice daily. Plan: Continue potassium chloride 20 mEq twice daily Reinforce dietary sources of potassium Recheck levels at next visit Consider caution with diuretic dose adjustment if needed in future Idiopathic peripheral neuropathy   Assessment: Likely multifactorial, related to both diabetes and Revlimid therapy. Currently managed with gabapentin 300 mg three times daily. Plan: Continue gabapentin 300 mg three times daily Monitor symptoms and efficacy Improved glycemic control may help reduce diabetes component   Language barrier affecting health care Assessment: Patient speaks [place] ([place] dialect) with limited [place] proficiency. Interpreter services utilized effectively for today's  visit. Plan: Continue to arrange interpreter services for all future appointments Provide translated written instructions when available Avoid online/virtual appointments when possible to ensure clear communication Consider pictorial medication instructions Multiple myeloma in remission [facility] Assessment: Stable on maintenance therapy with Revlimid. Recent labs (03/11/2024) show M-protein 0.3 g/dL. Taking acyclovir for HSV prophylaxis and aspirin 81 mg daily for VTE prophylaxis. Plan: Continue Revlimid per oncology recommendation Continue acyclovir prophylaxis Continue aspirin 81 mg daily for VTE prophylaxis Monitor for medication side effects (including peripheral neuropathy)   Not addressed today:     Blurry Vision (H53.8): Likely related to diabetes. Improved glycemic control may help. Will arrange ophthalmology referral with interpreter services. Dental Problems/Chronic Apical Periodontitis: Continue dental follow-up as scheduled. Hypocalcemia: Recent calcium slightly low at 8.6 mg/dL. Monitor at next visit.   Preventive Care and Health Maintenance       Assessment: Due for annual diabetic eye exam and diabetes education refresher. Plan: Arrange diabetic retinopathy screening with ophthalmology Consider diabetes education referral with interpreter Influenza vaccination due COVID-19 vaccination due for updated strain Continue vitamin B12 supplementation        Orders Placed During this Encounter:   Orders Placed This Encounter  Procedures   POCT HgB A1C   Meds ordered this encounter  Medications   metFORMIN (GLUCOPHAGE) 500 MG tablet    Sig: Take 2 tablets (1,000 mg total) by mouth 2 (two) times daily with a meal. 2 asar 1 wot, 2 wot mnhum  amang 1 hroi    Dispense:  360 tablet    Refill:  3   dapagliflozin propanediol (FARXIGA) 10 MG TABS tablet    Sig: Take 1 tablet (10 mg total) by mouth daily before breakfast. sa asar sa hroi    Dispense:  90 tablet    Refill:  3    tirzepatide (MOUNJARO) 10 MG/0.5ML Pen    Sig: Inject 10 mg into the skin once a week.    Dispense:  6 mL    Refill:  3  Follow-up Plan: Return in 3 months for diabetes follow-up. Continue all current medications with adjustment to [facility] as noted above. Arrange ophthalmology referral with interpreter services.        **This document was synthesized by artificial intelligence (Abridge) using HIPAA-compliant recording of the clinical interaction;   We discussed the use of AI scribe software for clinical note transcription with the patient, who gave verbal consent to proceed.    Additional Info: This encounter employed state-of-the-art, real-time, collaborative documentation. The patient actively reviewed and assisted in updating their electronic medical record on a shared screen, ensuring transparency and facilitating joint problem-solving for the problem list, overview, and plan. This approach promotes accurate, informed care. The treatment plan was discussed and reviewed in detail, including medication safety, potential side effects, and all patient questions. We confirmed understanding and comfort with the plan. Follow-up instructions were established, including contacting the office for any concerns, returning if symptoms worsen, persist, or new symptoms develop, and precautions for potential emergency department visits.  Analysis of Medical Decision Making Complexity After careful analysis of the documented encounter, I believe this visit meets the requirements for a 40981 (low to moderate complexity) E/M code rather than a 99214. Here's my detailed assessment: Element 1: Number and Complexity of Problems Addressed  Primary problem: Type 2 diabetes with hyperglycemia showing improvement (A1c improved from 8.4-8.9% to 7.0%) Secondary problems: Hypertension (stable), hypokalemia (stable on supplements), peripheral neuropathy (stable management), multiple myeloma (in  remission) Analysis: While multiple conditions were addressed, the diabetes is actually showing improvement rather than exacerbation, and other conditions are stable. This falls between low to moderate complexity.  Element 2: Amount and/or Complexity of Data Reviewed  Review of A1c trend data (7.0%, down from previous values) Review of home glucose monitoring ranges (130-260 mg/dL) Review of recent labs including potassium levels and kidney function Review of weight trend showing improvement (169 lbs) Analysis: The data review is limited to straightforward lab interpretation without extensive additional testing or independent interpretation of studies. This represents limited data complexity.  Element 3: Risk of Complications/Morbidity/Mortality  Medication adjustment: Increasing Mounjaro from 7.5mg  to 10mg  Continuing management of multiple chronic conditions Analysis: While medication adjustment was made, it was a standardized dose increase of an already-initiated medication with improving disease markers. The risk level is low to moderate. Social determinants of health language challenges shifts this toward moderate risk.  Overall Assessment To qualify for 19147, the documentation would need to demonstrate at least two elements at the moderate level. The documentation shows:  Problems: Low to moderate complexity Data: Limited complexity Risk: Low to moderate complexity  The encounter lacks documentation of elements that would more clearly support 82956, such as:  Management of poorly controlled chronic illness Significant data analysis beyond routine labs Prescription decisions carrying higher risk or requiring intensive monitoring  Therefore, based on the documented encounter, 99213 appears to be the more appropriate code for this visit.

## 2024-03-21 NOTE — Assessment & Plan Note (Signed)
 Assessment: Stable on maintenance therapy with Revlimid. Recent labs (03/11/2024) show M-protein 0.3 g/dL. Taking acyclovir for HSV prophylaxis and aspirin 81 mg daily for VTE prophylaxis. Plan: Continue Revlimid per oncology recommendation Continue acyclovir prophylaxis Continue aspirin 81 mg daily for VTE prophylaxis Monitor for medication side effects (including peripheral neuropathy)

## 2024-03-21 NOTE — Patient Instructions (Addendum)
   AFTER VISIT SUMMARY   Visit Date: March 21, 2024  Visit Overview Thank you for coming in today to discuss your diabetes management. Your lab results show significant improvement with your A1c now at 7.0%, which meets our target goal. Your blood pressure was well-controlled today at 110/80 mmHg. We also noted your weight is trending down favorably at 169 lbs.  Your Health Conditions Condition Status  Type 2 Diabetes Showing improved control but still experiencing glucose fluctuations  Hypertension Well-controlled on current medication  Low Potassium Continuing with supplements  Peripheral Neuropathy Continuing current management  Multiple Myeloma Remains in remission with ongoing maintenance therapy  Medication Changes Medication Instructions  Mounjaro INCREASED from 7.5 mg to 10 mg weekly Start after your next regular 7.5 mg dose   Metformin Continue 1000 mg (two tablets) twice daily before meals Jirai: 2 asar 1 wot, 2 wot mnhum amang 1 hroi   Farxiga Continue 10 mg once daily Jirai: sa asar sa hroi   Other medications Continue all other medications as previously prescribed  Today's Recommendations     Diet: Continue working on reducing rice consumption to help keep your blood sugars more stable       Monitoring: Consider using your continuous glucose monitor more frequently       Kidney Health: Continue losartan to protect your kidneys       Electrolytes: Keep taking your potassium supplements as prescribed  What's Due Item Details  Diabetic Eye Exam Need to schedule annual screening  Vaccinations Due for influenza and updated COVID-19 vaccines  Diabetes Education Consider refresher session with interpreter  Follow-Up Plan Return in 3 months (late June 2025) We will: Check your A1c again Review your blood glucose patterns Monitor your potassium level Assess your medication effectiveness    Important Reminders Please Remember: Take all medications as prescribed Call  if you experience any unusual symptoms Bring your glucose meter to your next appointment Continue home blood pressure monitoring If you have any questions or concerns before your next appointment, please contact our office.     Great progress on your A1c improvement!

## 2024-03-21 NOTE — Assessment & Plan Note (Signed)
 Assessment: Well-controlled today with blood pressure 110/80 mmHg. Currently on losartan 25 mg daily. Plan: Continue losartan 25 mg daily Encourage home BP monitoring Continue lifestyle modifications including dietary approaches and weight management

## 2024-03-21 NOTE — Assessment & Plan Note (Signed)
 Assessment: Stable renal function with creatinine 0.70 mg/dL (40/98/1191) and eGFR >47 mL/min. Microalbuminuria present (last measured 12.2 mg/g on 12/09/2023). Kidney protection measures include Farxiga (SGLT2 inhibitor) and losartan (ARB). Plan: Continue losartan 25 mg daily for renoprotection Continue Farxiga for dual glycemic and renal benefits Monitor electrolytes, particularly given history of hypokalemia Repeat urinary microalbumin in 6 months

## 2024-03-21 NOTE — Assessment & Plan Note (Signed)
 Assessment: Diabetes control showing notable improvement with today's A1c at 7.0%, down from previous range of 8.4-8.9%. Home glucose monitoring shows continued fluctuations (130-260 mg/dL), with higher readings possibly related to rice consumption. Patient is currently on Mounjaro 7.5 mg weekly, metformin 1000 mg twice daily, and Farxiga 10 mg daily. Weight trending down favorably at 169 lbs (BMI 27.29). Plan: Increase Mounjaro to 10 mg weekly, starting one week after the last 7.5 mg dose Continue Metformin 1000 mg orally twice daily (two tablets before breakfast and two before dinner) Continue Farxiga 10 mg orally once daily Congratulate patient on improved A1c, now at target range Reinforce dietary modifications, particularly regarding rice consumption Recommend continuous glucose monitoring for more accurate tracking Follow-up in 3 months with repeat A1c

## 2024-03-22 ENCOUNTER — Other Ambulatory Visit: Payer: Self-pay

## 2024-03-23 ENCOUNTER — Other Ambulatory Visit: Payer: Self-pay

## 2024-03-24 NOTE — Progress Notes (Signed)
 HEMATOLOGY/ONCOLOGY CLINIC NOTE  Date of Service: 03/25/2024  Chief complaint - Follow-up for continued evaluation and management of high risk multiple myeloma  Current treatment-daratumumab/Revlimid/dexamethasone Has not f/u on and does not appear keen to consider Melphalan/HDT-AUTOHSCT  INTERVAL HISTORY:  Donald Suarez is a 58 y.o. male is here for continued evaluation and management of his multiple myeloma. Patient was last seen by me on 02/12/2024 and reported no new complaints.   Today, he is scheduled to receive cycle 19 day 16 of treatment. Patient is accompanied by an interpreter.   He reports no new concerns since his last clinical visit. Patient has been tolerating his treatment well with no new or major toxicity issues. He denies any concern for nausea, vomiting, diarrhea, new bone pain, leg swelling, headache, dizziness, infection issues, or abdominal pain. He has been eating well and his weight has been fairly stable. Patient does stay well-hydrated.  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   MEDICAL HISTORY:  Past Medical History:  Diagnosis Date   Abfraction 08/06/2022   Accretions on teeth 08/06/2022   Anemia    Attrition, teeth excessive 08/06/2022   Caries 08/06/2022   Defective dental restoration 08/06/2022   Elevated ferritin 08/07/2022   Hypocalcemia 08/19/2022   In setting of multiple myeloma    Loose, teeth 08/06/2022   Malocclusion 08/06/2022   Microalbuminuria 08/19/2022   Periodontal disease 08/06/2022   Peripheral neuropathy 09/08/2022   Started 08/2022 A/w diabetes and revlimid usage Burning on top of left foot   Teeth missing 08/06/2022    SURGICAL HISTORY: Past Surgical History:  Procedure Laterality Date   APPENDECTOMY     Quaran BONE MARROW BIOPSY & ASPIRATION  02/11/2023   E[facility]rem FLUORO GUIDE CV LINE RIGHT  05/15/2022   Nestor PATIENT EVAL TECH 0-60 MINS  05/19/2022   Izzy US GUIDE VASC ACCESS RIGHT  05/15/2022     SOCIAL HISTORY: Social History   Socioeconomic History   Marital status: Married    Spouse name: Not on file   Number of children: Not on file   Years of education: Not on file   Highest education level: Not on file  Occupational History   Not on file  Tobacco Use   Smoking status: Never   Smokeless tobacco: Never  Vaping Use   Vaping status: Never Used  Substance and Sexual Activity   Alcohol use: Never   Drug use: Never   Sexual activity: Not on file  Other Topics Concern   Not on file  Social History Narrative   Not on file   Social Drivers of Health   Financial Resource Strain: Medium Risk (05/21/2022)   Overall Financial Resource Strain (CARDIA)    Difficulty of Paying Living Expenses: Somewhat hard  Food Insecurity: Food Insecurity Present (05/21/2022)   Hunger Vital Sign    Worried About Running Out of Food in the Last Year: Sometimes true    Ran Out of Food in the Last Year: Sometimes true  Transportation Needs: No Transportation Needs (05/21/2022)   PRAPARE - Administrator, Civil Service (Medical): No    Lack of Transportation (Non-Medical): No  Physical Activity: Not on file  Stress: Not on file  Social Connections: Not on file  Intimate Partner Violence: Not on file    FAMILY HISTORY: No family history on file.  ALLERGIES:  has no known allergies.  MEDICATIONS:  . Allergies as of 03/25/2024   No Known  Allergies      Medication List        Accurate as of March 24, 2024  1:25 PM. If you have any questions, ask your nurse or doctor.          Accu-Chek Guide test strip Generic drug: glucose blood Use to test blood sugars up to 4 times daily as directed.   Accu-Chek Guide w/Device Kit Use to test blood sugars up to 4 times daily as directed.   Accu-Chek Softclix Lancets lancets Use to test blood sugars up to 4 times daily as directed.   acyclovir 400 MG tablet Commonly known as: ZOVIRAX Take 1 tablet (400 mg total) by  mouth 2 (two) times daily.   aspirin EC 81 MG tablet Take 1 tablet (81 mg total) by mouth daily. Swallow whole.   B-12 1000 MCG Caps Take 1 tablet by mouth daily.   dapagliflozin propanediol 10 MG Tabs tablet Commonly known as: Farxiga Take 1 tablet (10 mg total) by mouth daily before breakfast. sa asar sa hroi   Dexcom G7 Receiver Devi 1 Device by Does not apply route continuous.   Dexcom G7 Sensor Misc 1 each by Does not apply route continuous. Change sensor every ten days.   gabapentin 300 MG capsule Commonly known as: NEURONTIN Take 1 capsule (300 mg total) by mouth 3 (three) times daily.   lenalidomide 25 MG capsule Commonly known as: REVLIMID TAKE 1 CAPSULE BY MOUTH 1 TIME A DAY FOR 21 DAYS ON THEN 7 DAYS OFF   losartan 25 MG tablet Commonly known as: COZAAR Take 1 tablet (25 mg total) by mouth daily. Start taking if blood pressure is over 130/80.  Protects kidneys and lowers blood pressure.   metFORMIN 500 MG tablet Commonly known as: GLUCOPHAGE Take 2 tablets (1,000 mg total) by mouth 2 (two) times daily with a meal. 2 asar 1 wot, 2 wot mnhum  amang 1 hroi   potassium chloride SA 20 MEQ tablet Commonly known as: Klor-Con M20 Take 1 tablet (20 mEq total) by mouth 2 (two) times daily.   rosuvastatin 10 MG tablet Commonly known as: CRESTOR Take 1 tablet (10 mg total) by mouth daily.   tirzepatide 10 MG/0.5ML Pen Commonly known as: MOUNJARO Inject 10 mg into the skin once a week.         REVIEW OF SYSTEMS:    10 Point review of Systems was done is negative except as noted above.   PHYSICAL EXAMINATION: Marland KitchenMarland KitchenThere were no vitals taken for this visit.    GENERAL:alert, in no acute distress and comfortable SKIN: no acute rashes, no significant lesions EYES: conjunctiva are pink and non-injected, sclera anicteric OROPHARYNX: MMM, no exudates, no oropharyngeal erythema or ulceration NECK: supple, no JVD LYMPH:  no palpable lymphadenopathy in the cervical,  axillary or inguinal regions LUNGS: clear to auscultation b/l with normal respiratory effort HEART: regular rate & rhythm ABDOMEN:  normoactive bowel sounds , non tender, not distended. Extremity: no pedal edema PSYCH: alert & oriented x 3 with fluent speech NEURO: no focal motor/sensory deficits    LABORATORY DATA:  I have reviewed the data as listed .    Latest Ref Rng & Units 03/11/2024   11:44 AM 02/26/2024   12:03 PM 02/12/2024    8:58 AM  CBC  WBC 4.0 - 10.5 K/uL 6.1  6.5  7.0   Hemoglobin 13.0 - 17.0 g/dL 16.1  09.6  04.5   Hematocrit 39.0 - 52.0 % 45.9  44.8  45.6   Platelets 150 - 400 K/uL 193  184  126    .    Latest Ref Rng & Units 03/11/2024   11:44 AM 02/26/2024   12:03 PM 02/12/2024    9:09 AM  CMP  Glucose 70 - 99 mg/dL 161  096  045   BUN 6 - 20 mg/dL 15  9  11    Creatinine 0.61 - 1.24 mg/dL 4.09  8.11  9.14   Sodium 135 - 145 mmol/L 140  137  138   Potassium 3.5 - 5.1 mmol/L 3.2  3.4  2.9   Chloride 98 - 111 mmol/L 108  105  102   CO2 22 - 32 mmol/L 25  26  28    Calcium 8.9 - 10.3 mg/dL 8.6  8.9  9.1   Total Protein 6.5 - 8.1 g/dL 6.7  6.3  6.6   Total Bilirubin 0.0 - 1.2 mg/dL 0.8  0.8  0.7   Alkaline Phos 38 - 126 U/L 58  52  50   AST 15 - 41 U/L 16  13  11    ALT 0 - 44 U/L 35  21  12    . Lab Results  Component Value Date   LDH 114 10/10/2022      Bone marrow biopsy 02/11/2023   RADIOGRAPHIC STUDIES: I have personally reviewed the radiological images as listed and agreed with the findings in the report. No results found.  ASSESSMENT & PLAN:   58 y.o. 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  Patient diagnosed December 2021 and received 1 cycle of CyBorD. Had previously presented with anemia renal insufficiency and hyperviscosity symptoms. Bx- 90% plasma cells Initial M spike 8.03g/dl NWG(9F): Not Detected  Dup(1q): Not Detected   Gains(15): DETECTED  Gains(5 and 9): Not Detected  Del(13q)/-13: DETECTED  Del(17p)(TP53): Not Detected  IGH(Rearrangement): SEE BELOW   IgH complex: t(4;14): DETECTED  t(11;14): Not Detected  t(14;16): Not Detected  t(14;20): Not Detected   Cytogenetics: Normal male karyotype.   -Labs from 05/13/2022-beta-2 microglobulin 4.4, LDH 197, lambda free light chain 77.1, kappa, lambda light chain ratio 0.08, multiple myeloma panel pending.  UPEP ordered but not yet collected. 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  Due to multiple myeloma   PLAN  -Discussed lab results on 03/25/2024 in detail with patient. CBC showed WBC of 5.9K, hemoglobin of 14.3, and platelets of 144K. 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  All of the patient's questions were answered with apparent  satisfaction. The patient knows to call the clinic with any problems, questions or concerns.   Wyvonnia Lora MD MS AAHIVMS [facility] Hematology/Oncology Physician [facility]  .*Total Encounter Time as defined by the Centers for Medicare and Medicaid Services includes, in addition to the face-to-face time of a patient visit (documented in the note above) non-face-to-face time: obtaining and reviewing outside history, ordering and reviewing medications, tests or procedures, care coordination (communications with other health care professionals or caregivers) and documentation in the medical record.    I,Mitra Faeizi,acting as a Neurosurgeon for Wyvonnia Lora, MD.,have documented all relevant documentation on the behalf of Wyvonnia Lora, MD,as directed by  Wyvonnia Lora, MD while in the presence of Wyvonnia Lora, MD.  ***

## 2024-03-25 ENCOUNTER — Inpatient Hospital Stay: Payer: 59 | Admitting: Hematology

## 2024-03-25 ENCOUNTER — Inpatient Hospital Stay: Payer: 59

## 2024-03-25 VITALS — BP 122/82 | HR 84 | Temp 97.6°F | Resp 15 | Wt 172.5 lb

## 2024-03-25 DIAGNOSIS — Z5112 Encounter for antineoplastic immunotherapy: Secondary | ICD-10-CM | POA: Diagnosis not present

## 2024-03-25 DIAGNOSIS — C9001 Multiple myeloma in remission: Secondary | ICD-10-CM

## 2024-03-25 DIAGNOSIS — Z7189 Other specified counseling: Secondary | ICD-10-CM

## 2024-03-25 LAB — CBC WITH DIFFERENTIAL (CANCER CENTER ONLY)
Abs Immature Granulocytes: 0.01 10*3/uL (ref 0.00–0.07)
Basophils Absolute: 0.1 10*3/uL (ref 0.0–0.1)
Basophils Relative: 2 %
Eosinophils Absolute: 0.6 10*3/uL — ABNORMAL HIGH (ref 0.0–0.5)
Eosinophils Relative: 10 %
HCT: 43.2 % (ref 39.0–52.0)
Hemoglobin: 14.3 g/dL (ref 13.0–17.0)
Immature Granulocytes: 0 %
Lymphocytes Relative: 32 %
Lymphs Abs: 1.9 10*3/uL (ref 0.7–4.0)
MCH: 26.8 pg (ref 26.0–34.0)
MCHC: 33.1 g/dL (ref 30.0–36.0)
MCV: 80.9 fL (ref 80.0–100.0)
Monocytes Absolute: 0.4 10*3/uL (ref 0.1–1.0)
Monocytes Relative: 7 %
Neutro Abs: 2.9 10*3/uL (ref 1.7–7.7)
Neutrophils Relative %: 49 %
Platelet Count: 144 10*3/uL — ABNORMAL LOW (ref 150–400)
RBC: 5.34 MIL/uL (ref 4.22–5.81)
RDW: 15.5 % (ref 11.5–15.5)
WBC Count: 5.9 10*3/uL (ref 4.0–10.5)
nRBC: 0 % (ref 0.0–0.2)

## 2024-03-25 LAB — COMPREHENSIVE METABOLIC PANEL WITH GFR
ALT: 11 U/L (ref 0–44)
AST: 13 U/L — ABNORMAL LOW (ref 15–41)
Albumin: 4 g/dL (ref 3.5–5.0)
Alkaline Phosphatase: 51 U/L (ref 38–126)
Anion gap: 6 (ref 5–15)
BUN: 14 mg/dL (ref 6–20)
CO2: 25 mmol/L (ref 22–32)
Calcium: 8.8 mg/dL — ABNORMAL LOW (ref 8.9–10.3)
Chloride: 108 mmol/L (ref 98–111)
Creatinine, Ser: 0.77 mg/dL (ref 0.61–1.24)
GFR, Estimated: >60 mL/min (ref >60)
Glucose, Bld: 172 mg/dL — ABNORMAL HIGH (ref 70–99)
Potassium: 3.4 mmol/L — ABNORMAL LOW (ref 3.5–5.1)
Sodium: 139 mmol/L (ref 135–145)
Total Bilirubin: 0.8 mg/dL (ref 0.0–1.2)
Total Protein: 6.2 g/dL — ABNORMAL LOW (ref 6.5–8.1)

## 2024-03-25 MED ORDER — ACETAMINOPHEN 325 MG PO TABS
650.0000 mg | ORAL_TABLET | Freq: Once | ORAL | Status: AC
  Administered 2024-03-25: 650 mg via ORAL
  Filled 2024-03-25: qty 2

## 2024-03-25 MED ORDER — FAMOTIDINE IN NACL 20-0.9 MG/50ML-% IV SOLN
20.0000 mg | Freq: Once | INTRAVENOUS | Status: AC
  Administered 2024-03-25: 20 mg via INTRAVENOUS
  Filled 2024-03-25: qty 50

## 2024-03-25 MED ORDER — DIPHENHYDRAMINE HCL 25 MG PO CAPS
50.0000 mg | ORAL_CAPSULE | Freq: Once | ORAL | Status: AC
Start: 2024-03-25 — End: 2024-03-25
  Administered 2024-03-25: 50 mg via ORAL
  Filled 2024-03-25: qty 2

## 2024-03-25 MED ORDER — DEXAMETHASONE SODIUM PHOSPHATE 10 MG/ML IJ SOLN
2.0000 mg | Freq: Once | INTRAMUSCULAR | Status: AC
  Administered 2024-03-25: 2 mg via INTRAVENOUS
  Filled 2024-03-25: qty 1

## 2024-03-25 MED ORDER — MONTELUKAST SODIUM 10 MG PO TABS
10.0000 mg | ORAL_TABLET | Freq: Once | ORAL | Status: AC
  Administered 2024-03-25: 10 mg via ORAL
  Filled 2024-03-25: qty 1

## 2024-03-25 MED ORDER — SODIUM CHLORIDE 0.9 % IV SOLN: Freq: Once | INTRAVENOUS | Status: AC

## 2024-03-25 MED ORDER — DARATUMUMAB CHEMO INJECTION 400 MG/20ML
16.0000 mg/kg | Freq: Once | INTRAVENOUS | Status: AC
  Administered 2024-03-25: 1300 mg via INTRAVENOUS
  Filled 2024-03-25: qty 5

## 2024-03-25 NOTE — Patient Instructions (Signed)
 [facility] - A DEPT OF [facility]  Discharge Instructions: Thank you for choosing [facility] to provide your oncology and hematology care.   If you have a lab appointment with the Cancer Center, please go directly to the Cancer Center and check in at the registration area.   Wear comfortable clothing and clothing appropriate for easy access to any Portacath or PICC line.   We strive to give you quality time with your provider. You may need to reschedule your appointment if you arrive late (15 or more minutes).  Arriving late affects you and other patients whose appointments are after yours.  Also, if you miss three or more appointments without notifying the office, you may be dismissed from the clinic at the provider's discretion.      For prescription refill requests, have your pharmacy contact our office and allow 72 hours for refills to be completed.    Today you received the following chemotherapy and/or immunotherapy agents: Daratumumab.       To help prevent nausea and vomiting after your treatment, we encourage you to take your nausea medication as directed.  BELOW ARE SYMPTOMS THAT SHOULD BE REPORTED IMMEDIATELY: *FEVER GREATER THAN 100.4 F (38 C) OR HIGHER *CHILLS OR SWEATING *NAUSEA AND VOMITING THAT IS NOT CONTROLLED WITH YOUR NAUSEA MEDICATION *UNUSUAL SHORTNESS OF BREATH *UNUSUAL BRUISING OR BLEEDING *URINARY PROBLEMS (pain or burning when urinating, or frequent urination) *BOWEL PROBLEMS (unusual diarrhea, constipation, pain near the anus) TENDERNESS IN MOUTH AND THROAT WITH OR WITHOUT PRESENCE OF ULCERS (sore throat, sores in mouth, or a toothache) UNUSUAL RASH, SWELLING OR PAIN  UNUSUAL VAGINAL DISCHARGE OR ITCHING   Items with * indicate a potential emergency and should be followed up as soon as possible or go to the Emergency Department if any problems should occur.  Please show the CHEMOTHERAPY ALERT CARD or  IMMUNOTHERAPY ALERT CARD at check-in to the Emergency Department and triage nurse.  Should you have questions after your visit or need to cancel or reschedule your appointment, please contact [facility] - A DEPT OF [facility]  Dept: [phone]  and follow the prompts.  Office hours are 8:00 a.m. to 4:30 p.m. Monday - Friday. Please note that voicemails left after 4:00 p.m. may not be returned until the following business day.  We are closed weekends and major holidays. You have access to a nurse at all times for urgent questions. Please call the main number to the clinic Dept: [phone] and follow the prompts.   For any non-urgent questions, you may also contact your provider using MyChart. We now offer e-Visits for anyone 55 and older to request care online for non-urgent symptoms. For details visit [email or web address].   Also download the MyChart app! Go to the app store, search "MyChart", open the app, select , and log in with your MyChart username and password.  Zoledronic Acid Injection (Cancer) What is this medication? ZOLEDRONIC ACID (ZOE le dron ik AS id) treats high calcium levels in the blood caused by cancer. It may also be used with chemotherapy to treat weakened bones caused by cancer. It works by slowing down the release of calcium from bones. This lowers calcium levels in your blood. It also makes your bones stronger and less likely to break (fracture). It belongs to a group of medications called bisphosphonates. This medicine may be used for other purposes; ask your health care  provider or pharmacist if you have questions. COMMON BRAND NAME(S): Zometa, Zometa Powder What should I tell my care team before I take this medication? They need to know if you have any of these conditions: Dehydration Dental disease Kidney disease Liver disease Low levels of calcium in the blood Lung or breathing disease, such as asthma Receiving  steroids, such as dexamethasone or prednisone An unusual or allergic reaction to zoledronic acid, other medications, foods, dyes, or preservatives Pregnant or trying to get pregnant Breast-feeding How should I use this medication? This medication is injected into a vein. It is given by your care team in a hospital or clinic setting. Talk to your care team about the use of this medication in children. Special care may be needed. Overdosage: If you think you have taken too much of this medicine contact a poison control center or emergency room at once. NOTE: This medicine is only for you. Do not share this medicine with others. What if I miss a dose? Keep appointments for follow-up doses. It is important not to miss your dose. Call your care team if you are unable to keep an appointment. What may interact with this medication? Certain antibiotics given by injection Diuretics, such as bumetanide, furosemide NSAIDs, medications for pain and inflammation, such as ibuprofen or naproxen Teriparatide Thalidomide This list may not describe all possible interactions. Give your health care provider a list of all the medicines, herbs, non-prescription drugs, or dietary supplements you use. Also tell them if you smoke, drink alcohol, or use illegal drugs. Some items may interact with your medicine. What should I watch for while using this medication? Visit your care team for regular checks on your progress. It may be some time before you see the benefit from this medication. Some people who take this medication have severe bone, joint, or muscle pain. This medication may also increase your risk for jaw problems or a broken thigh bone. Tell your care team right away if you have severe pain in your jaw, bones, joints, or muscles. Tell you care team if you have any pain that does not go away or that gets worse. Tell your dentist and dental surgeon that you are taking this medication. You should not have major  dental surgery while on this medication. See your dentist to have a dental exam and fix any dental problems before starting this medication. Take good care of your teeth while on this medication. Make sure you see your dentist for regular follow-up appointments. You should make sure you get enough calcium and vitamin D while you are taking this medication. Discuss the foods you eat and the vitamins you take with your care team. Check with your care team if you have severe diarrhea, nausea, and vomiting, or if you sweat a lot. The loss of too much body fluid may make it dangerous for you to take this medication. You may need bloodwork while taking this medication. Talk to your care team if you wish to become pregnant or think you might be pregnant. This medication can cause serious birth defects. What side effects may I notice from receiving this medication? Side effects that you should report to your care team as soon as possible: Allergic reactions--skin rash, itching, hives, swelling of the face, lips, tongue, or throat Kidney injury--decrease in the amount of urine, swelling of the ankles, hands, or feet Low calcium level--muscle pain or cramps, confusion, tingling, or numbness in the hands or feet Osteonecrosis of the jaw--pain, swelling, or  redness in the mouth, numbness of the jaw, poor healing after dental work, unusual discharge from the mouth, visible bones in the mouth Severe bone, joint, or muscle pain Side effects that usually do not require medical attention (report to your care team if they continue or are bothersome): Constipation Fatigue Fever Loss of appetite Nausea Stomach pain This list may not describe all possible side effects. Call your doctor for medical advice about side effects. You may report side effects to FDA at 1-800-FDA-1088. Where should I keep my medication? This medication is given in a hospital or clinic. It will not be stored at home. NOTE: This sheet is a  summary. It may not cover all possible information. If you have questions about this medicine, talk to your doctor, pharmacist, or health care provider.  2024 [employer] Standard (2022-02-07 00:00:00)

## 2024-03-25 NOTE — Progress Notes (Signed)
 Patient seen by Dr. Addison Naegeli are within treatment parameters.  Labs reviewed: and are within treatment parameters.  Per physician team, patient is ready for treatment and there are NO modifications to the treatment plan.

## 2024-03-29 ENCOUNTER — Other Ambulatory Visit: Payer: Self-pay

## 2024-03-29 LAB — MULTIPLE MYELOMA PANEL, SERUM
Albumin SerPl Elph-Mcnc: 3.4 g/dL (ref 2.9–4.4)
Albumin/Glob SerPl: 1.4 (ref 0.7–1.7)
Alpha 1: 0.2 g/dL (ref 0.0–0.4)
Alpha2 Glob SerPl Elph-Mcnc: 0.9 g/dL (ref 0.4–1.0)
B-Globulin SerPl Elph-Mcnc: 0.9 g/dL (ref 0.7–1.3)
Gamma Glob SerPl Elph-Mcnc: 0.7 g/dL (ref 0.4–1.8)
Globulin, Total: 2.6 g/dL (ref 2.2–3.9)
IgA: 137 mg/dL (ref 90–386)
IgG (Immunoglobin G), Serum: 742 mg/dL (ref 603–1613)
IgM (Immunoglobulin M), Srm: 21 mg/dL (ref 20–172)
M Protein SerPl Elph-Mcnc: 0.3 g/dL — ABNORMAL HIGH
Total Protein ELP: 6 g/dL (ref 6.0–8.5)

## 2024-03-30 ENCOUNTER — Other Ambulatory Visit: Payer: Self-pay | Admitting: Hematology

## 2024-03-30 DIAGNOSIS — C9000 Multiple myeloma not having achieved remission: Secondary | ICD-10-CM

## 2024-03-31 ENCOUNTER — Encounter: Payer: Self-pay | Admitting: Hematology

## 2024-04-01 ENCOUNTER — Encounter: Payer: Self-pay | Admitting: Hematology

## 2024-04-07 NOTE — Addendum Note (Signed)
 Addended by: Xaidyn Kepner M on: 04/07/2024 09:55 AM   Modules accepted: Orders

## 2024-04-08 ENCOUNTER — Inpatient Hospital Stay: Payer: 59

## 2024-04-08 ENCOUNTER — Inpatient Hospital Stay: Payer: 59 | Attending: Hematology

## 2024-04-08 ENCOUNTER — Encounter: Payer: Self-pay | Admitting: Internal Medicine

## 2024-04-08 ENCOUNTER — Ambulatory Visit: Admitting: Internal Medicine

## 2024-04-08 ENCOUNTER — Inpatient Hospital Stay: Payer: 59 | Admitting: Physician Assistant

## 2024-04-08 VITALS — BP 120/72 | HR 77 | Temp 98.0°F | Ht 66.0 in | Wt 169.0 lb

## 2024-04-08 DIAGNOSIS — C9000 Multiple myeloma not having achieved remission: Secondary | ICD-10-CM | POA: Insufficient documentation

## 2024-04-08 DIAGNOSIS — Z7985 Long-term (current) use of injectable non-insulin antidiabetic drugs: Secondary | ICD-10-CM | POA: Diagnosis not present

## 2024-04-08 DIAGNOSIS — E1165 Type 2 diabetes mellitus with hyperglycemia: Secondary | ICD-10-CM | POA: Diagnosis not present

## 2024-04-08 DIAGNOSIS — E1129 Type 2 diabetes mellitus with other diabetic kidney complication: Secondary | ICD-10-CM | POA: Diagnosis not present

## 2024-04-08 DIAGNOSIS — Z7984 Long term (current) use of oral hypoglycemic drugs: Secondary | ICD-10-CM

## 2024-04-08 DIAGNOSIS — Z758 Other problems related to medical facilities and other health care: Secondary | ICD-10-CM

## 2024-04-08 DIAGNOSIS — Z5112 Encounter for antineoplastic immunotherapy: Secondary | ICD-10-CM | POA: Insufficient documentation

## 2024-04-08 DIAGNOSIS — Z603 Acculturation difficulty: Secondary | ICD-10-CM

## 2024-04-08 DIAGNOSIS — R809 Proteinuria, unspecified: Secondary | ICD-10-CM

## 2024-04-08 DIAGNOSIS — C9001 Multiple myeloma in remission: Secondary | ICD-10-CM

## 2024-04-08 NOTE — Progress Notes (Signed)
 ==============================  [facility]: [phone]   -- Medical Office Visit --  Patient: Donald Suarez      Age: 58 y.o.       Sex:  male  Date:   04/08/2024 Today's Healthcare Provider: Anthon Kins, MD  ==============================   Chief Complaint: Diabetes  History of Present Illness Donald Suarez is a 58 year old male with diabetes and multiple myeloma in remission who presents for setup of a continuous glucose monitor.  He is currently taking Farxiga at the maximum dose, Mounjaro injections weekly, and Revlimid for multiple myeloma, which is in remission. He experiences no side effects from these medications. He is using the continuous glucose monitor (CGM) to track his blood sugar levels, which have been fluctuating, with readings sometimes spiking after meals. He is interested in identifying foods that cause these spikes, particularly rice, which he consumes regularly. He is learning to use the CGM app on his phone to track his glucose levels, and it will take about 60 minutes for the device to start providing readings.  He has a history of multiple myeloma, which is currently in remission due to treatment with Revlimid. Controlling his blood sugar is important for maintaining remission, and he is motivated to make dietary changes to support this.  Lab Results  Component Value Date   HGBA1C 7.0 (A) 03/21/2024   HGBA1C 8.4 (H) 12/09/2023   HGBA1C 8.9 (H) 09/09/2023    Background: Reviewed: He has Multiple myeloma in remission (HCC); Counseling regarding advance care planning and goals of care; Hypoalbuminemia; Hyperviscosity; Type 2 diabetes mellitus with hyperglycemia (HCC); Chronic apical periodontitis; Hyperproteinemia; Blurry vision; Hypomagnesemia; Elevated ferritin; Microalbuminuria; Hypocalcemia; Peripheral neuropathy; Language barrier affecting health care; Hypokalemia; History of dental problems; At high risk for venous  thromboembolism; Diabetes mellitus with proteinuria (HCC); Hypertension; and Chronic kidney disease on their problem list.  Reviewed: He   has a past medical history of Abfraction (08/06/2022), Accretions on teeth (08/06/2022), Anemia, Attrition, teeth excessive (08/06/2022), Caries (08/06/2022), Defective dental restoration (08/06/2022), Elevated ferritin (08/07/2022), Hypocalcemia (08/19/2022), Loose, teeth (08/06/2022), Malocclusion (08/06/2022), Microalbuminuria (08/19/2022), Periodontal disease (08/06/2022), Peripheral neuropathy (09/08/2022), and Teeth missing (08/06/2022).  Manually updated: No problems updated.  Reviewed:  Allergies as of 04/08/2024   (No Known Allergies)    Medications: Reviewed: Current Outpatient Medications on File Prior to Visit  Medication Sig   Accu-Chek Softclix Lancets lancets Use to test blood sugars up to 4 times daily as directed.   acyclovir (ZOVIRAX) 400 MG tablet Take 1 tablet (400 mg total) by mouth 2 (two) times daily.   aspirin EC 81 MG tablet Take 1 tablet (81 mg total) by mouth daily. Swallow whole.   Blood Glucose Monitoring Suppl (ACCU-CHEK GUIDE) w/Device KIT Use to test blood sugars up to 4 times daily as directed.   Continuous Glucose Receiver (DEXCOM G7 RECEIVER) DEVI 1 Device by Does not apply route continuous.   Continuous Glucose Sensor (DEXCOM G7 SENSOR) MISC 1 each by Does not apply route continuous. Change sensor every ten days.   Cyanocobalamin (B-12) 1000 MCG CAPS Take 1 tablet by mouth daily.   dapagliflozin propanediol (FARXIGA) 10 MG TABS tablet Take 1 tablet (10 mg total) by mouth daily before breakfast. sa asar sa hroi   gabapentin (NEURONTIN) 300 MG capsule Take 1 capsule (300 mg total) by mouth 3 (three) times daily.   glucose blood (ACCU-CHEK GUIDE) test strip Use to test blood sugars up to 4 times daily as directed.  lenalidomide (REVLIMID) 25 MG capsule TAKE 1 CAPSULE BY MOUTH 1 TIME A DAY FOR 21 DAYS ON THEN 7 DAYS OFF    losartan (COZAAR) 25 MG tablet Take 1 tablet (25 mg total) by mouth daily. Start taking if blood pressure is over 130/80.  Protects kidneys and lowers blood pressure.   metFORMIN (GLUCOPHAGE) 500 MG tablet Take 2 tablets (1,000 mg total) by mouth 2 (two) times daily with a meal. 2 asar 1 wot, 2 wot mnhum  amang 1 hroi   potassium chloride SA (KLOR-CON M20) 20 MEQ tablet Take 1 tablet (20 mEq total) by mouth 2 (two) times daily.   rosuvastatin (CRESTOR) 10 MG tablet Take 1 tablet (10 mg total) by mouth daily.   tirzepatide (MOUNJARO) 10 MG/0.5ML Pen Inject 10 mg into the skin once a week.   Current Facility-Administered Medications on File Prior to Visit  Medication   clotrimazole (LOTRIMIN) 1 % cream  There are no discontinued medications.     Physical Exam:    04/08/2024    9:57 AM 03/25/2024   11:07 AM 03/21/2024    8:41 AM  Vitals with BMI  Height 5\' 6"   5\' 6"   Weight 169 lbs 172 lbs 8 oz 169 lbs  BMI 27.29 27.86 27.29  Systolic 120 122 914  Diastolic 72 82 80  Pulse 77 84 96   Wt Readings from Last 10 Encounters:  04/08/24 169 lb (76.7 kg)  03/25/24 172 lb 8 oz (78.2 kg)  03/21/24 169 lb (76.7 kg)  03/11/24 172 lb 12.8 oz (78.4 kg)  02/26/24 176 lb 12 oz (80.2 kg)  02/22/24 178 lb 6.4 oz (80.9 kg)  02/12/24 170 lb 14.4 oz (77.5 kg)  01/20/24 177 lb 6.4 oz (80.5 kg)  01/15/24 172 lb 11.2 oz (78.3 kg)  12/18/23 179 lb 8 oz (81.4 kg)  Vital signs reviewed.  Nursing notes reviewed. Weight trend reviewed. Physical Exam  Physical Exam General Appearance:  No acute distress appreciable.   Well-groomed, healthy-appearing male.  Well proportioned with no abnormal fat distribution.  Good muscle tone. Pulmonary:  Normal work of breathing at rest, no respiratory distress apparent. SpO2: 98 %  Musculoskeletal: All extremities are intact.  Neurological:  Awake, alert, oriented, and engaged.  No obvious focal neurological deficits or cognitive impairments.  Sensorium seems unclouded.    Speech is clear and coherent with logical content. Psychiatric:  Appropriate mood, pleasant and cooperative demeanor, thoughtful and engaged during the exam  Appointment on 03/25/2024  Component Date Value   WBC Count 03/25/2024 5.9    RBC 03/25/2024 5.34    Hemoglobin 03/25/2024 14.3    HCT 03/25/2024 43.2    MCV 03/25/2024 80.9    MCH 03/25/2024 26.8    MCHC 03/25/2024 33.1    RDW 03/25/2024 15.5    Platelet Count 03/25/2024 144 (L)    nRBC 03/25/2024 0.0    Neutrophils Relative % 03/25/2024 49    Neutro Abs 03/25/2024 2.9    Lymphocytes Relative 03/25/2024 32    Lymphs Abs 03/25/2024 1.9    Monocytes Relative 03/25/2024 7    Monocytes Absolute 03/25/2024 0.4    Eosinophils Relative 03/25/2024 10    Eosinophils Absolute 03/25/2024 0.6 (H)    Basophils Relative 03/25/2024 2    Basophils Absolute 03/25/2024 0.1    Immature Granulocytes 03/25/2024 0    Abs Immature Granulocytes 03/25/2024 0.01    Sodium 03/25/2024 139    Potassium 03/25/2024 3.4 (L)    Chloride 03/25/2024 108  CO2 03/25/2024 25    Glucose, Bld 03/25/2024 172 (H)    BUN 03/25/2024 14    Creatinine, Ser 03/25/2024 0.77    Calcium 03/25/2024 8.8 (L)    Total Protein 03/25/2024 6.2 (L)    Albumin 03/25/2024 4.0    AST 03/25/2024 13 (L)    ALT 03/25/2024 11    Alkaline Phosphatase 03/25/2024 51    Total Bilirubin 03/25/2024 0.8    GFR, Estimated 03/25/2024 >60    Anion gap 03/25/2024 6    IgG (Immunoglobin G), Se* 03/25/2024 742    IgA 03/25/2024 137    IgM (Immunoglobulin M), * 03/25/2024 21    Total Protein ELP 03/25/2024 6.0    Albumin SerPl Elph-Mcnc 03/25/2024 3.4    Alpha 1 03/25/2024 0.2    Alpha2 Glob SerPl Elph-M* 03/25/2024 0.9    B-Globulin SerPl Elph-Mc* 03/25/2024 0.9    Gamma Glob SerPl Elph-Mc* 03/25/2024 0.7    M Protein SerPl Elph-Mcnc 03/25/2024 0.3 (H)    Globulin, Total 03/25/2024 2.6    Albumin/Glob SerPl 03/25/2024 1.4    IFE 1 03/25/2024 Comment (A)    Please Note 03/25/2024  Comment   Office Visit on 03/21/2024  Component Date Value   Hemoglobin A1C 03/21/2024 7.0 (A)   Appointment on 03/11/2024  Component Date Value   WBC Count 03/11/2024 6.1    RBC 03/11/2024 5.74    Hemoglobin 03/11/2024 15.3    HCT 03/11/2024 45.9    MCV 03/11/2024 80.0    MCH 03/11/2024 26.7    MCHC 03/11/2024 33.3    RDW 03/11/2024 15.9 (H)    Platelet Count 03/11/2024 193    nRBC 03/11/2024 0.0    Neutrophils Relative % 03/11/2024 38    Neutro Abs 03/11/2024 2.3    Lymphocytes Relative 03/11/2024 43    Lymphs Abs 03/11/2024 2.6    Monocytes Relative 03/11/2024 11    Monocytes Absolute 03/11/2024 0.7    Eosinophils Relative 03/11/2024 6    Eosinophils Absolute 03/11/2024 0.3    Basophils Relative 03/11/2024 2    Basophils Absolute 03/11/2024 0.1    Immature Granulocytes 03/11/2024 0    Abs Immature Granulocytes 03/11/2024 0.02    Sodium 03/11/2024 140    Potassium 03/11/2024 3.2 (L)    Chloride 03/11/2024 108    CO2 03/11/2024 25    Glucose, Bld 03/11/2024 205 (H)    BUN 03/11/2024 15    Creatinine, Ser 03/11/2024 0.70    Calcium 03/11/2024 8.6 (L)    Total Protein 03/11/2024 6.7    Albumin 03/11/2024 4.2    AST 03/11/2024 16    ALT 03/11/2024 35    Alkaline Phosphatase 03/11/2024 58    Total Bilirubin 03/11/2024 0.8    GFR, Estimated 03/11/2024 >60    Anion gap 03/11/2024 7    IgG (Immunoglobin G), Se* 03/11/2024 729    IgA 03/11/2024 155    IgM (Immunoglobulin M), * 03/11/2024 26    Total Protein ELP 03/11/2024 6.4    Albumin SerPl Elph-Mcnc 03/11/2024 3.6    Alpha 1 03/11/2024 0.2    Alpha2 Glob SerPl Elph-M* 03/11/2024 0.9    B-Globulin SerPl Elph-Mc* 03/11/2024 1.0    Gamma Glob SerPl Elph-Mc* 03/11/2024 0.7    M Protein SerPl Elph-Mcnc 03/11/2024 0.3 (H)    Globulin, Total 03/11/2024 2.8    Albumin/Glob SerPl 03/11/2024 1.3    IFE 1 03/11/2024 Comment (A)    Please Note 03/11/2024 Comment   Appointment on 02/26/2024  Component Date Value  WBC Count  02/26/2024 6.5    RBC 02/26/2024 5.60    Hemoglobin 02/26/2024 14.9    HCT 02/26/2024 44.8    MCV 02/26/2024 80.0    MCH 02/26/2024 26.6    MCHC 02/26/2024 33.3    RDW 02/26/2024 16.1 (H)    Platelet Count 02/26/2024 184    nRBC 02/26/2024 0.0    Neutrophils Relative % 02/26/2024 47    Neutro Abs 02/26/2024 3.0    Lymphocytes Relative 02/26/2024 38    Lymphs Abs 02/26/2024 2.5    Monocytes Relative 02/26/2024 8    Monocytes Absolute 02/26/2024 0.5    Eosinophils Relative 02/26/2024 6    Eosinophils Absolute 02/26/2024 0.4    Basophils Relative 02/26/2024 1    Basophils Absolute 02/26/2024 0.1    Immature Granulocytes 02/26/2024 0    Abs Immature Granulocytes 02/26/2024 0.02    Sodium 02/26/2024 137    Potassium 02/26/2024 3.4 (L)    Chloride 02/26/2024 105    CO2 02/26/2024 26    Glucose, Bld 02/26/2024 207 (H)    BUN 02/26/2024 9    Creatinine, Ser 02/26/2024 0.69    Calcium 02/26/2024 8.9    Total Protein 02/26/2024 6.3 (L)    Albumin 02/26/2024 4.0    AST 02/26/2024 13 (L)    ALT 02/26/2024 21    Alkaline Phosphatase 02/26/2024 52    Total Bilirubin 02/26/2024 0.8    GFR, Estimated 02/26/2024 >60    Anion gap 02/26/2024 6    IgG (Immunoglobin G), Se* 02/26/2024 728    IgA 02/26/2024 117    IgM (Immunoglobulin M), * 02/26/2024 29    Total Protein ELP 02/26/2024 5.9 (L)    Albumin SerPl Elph-Mcnc 02/26/2024 3.5    Alpha 1 02/26/2024 0.3    Alpha2 Glob SerPl Elph-M* 02/26/2024 0.6    B-Globulin SerPl Elph-Mc* 02/26/2024 0.9    Gamma Glob SerPl Elph-Mc* 02/26/2024 0.6    M Protein SerPl Elph-Mcnc 02/26/2024 0.3 (H)    Globulin, Total 02/26/2024 2.4    Albumin/Glob SerPl 02/26/2024 1.5    IFE 1 02/26/2024 Comment (A)    Please Note 02/26/2024 Comment   Appointment on 02/12/2024  Component Date Value   WBC Count 02/12/2024 7.0    RBC 02/12/2024 5.81    Hemoglobin 02/12/2024 15.3    HCT 02/12/2024 45.6    MCV 02/12/2024 78.5 (L)    MCH 02/12/2024 26.3    MCHC  02/12/2024 33.6    RDW 02/12/2024 15.4    Platelet Count 02/12/2024 126 (L)    nRBC 02/12/2024 0.0    Neutrophils Relative % 02/12/2024 41    Neutro Abs 02/12/2024 2.8    Lymphocytes Relative 02/12/2024 42    Lymphs Abs 02/12/2024 3.0    Monocytes Relative 02/12/2024 8    Monocytes Absolute 02/12/2024 0.5    Eosinophils Relative 02/12/2024 8    Eosinophils Absolute 02/12/2024 0.5    Basophils Relative 02/12/2024 1    Basophils Absolute 02/12/2024 0.1    Immature Granulocytes 02/12/2024 0    Abs Immature Granulocytes 02/12/2024 0.02    IgG (Immunoglobin G), Se* 02/12/2024 800    IgA 02/12/2024 108    IgM (Immunoglobulin M), * 02/12/2024 25    Total Protein ELP 02/12/2024 6.2    Albumin SerPl Elph-Mcnc 02/12/2024 3.8    Alpha 1 02/12/2024 0.2    Alpha2 Glob SerPl Elph-M* 02/12/2024 0.8    B-Globulin SerPl Elph-Mc* 02/12/2024 0.8    Gamma Glob SerPl Elph-Mc* 02/12/2024 0.7  M Protein SerPl Elph-Mcnc 02/12/2024 0.3 (H)    Globulin, Total 02/12/2024 2.4    Albumin/Glob SerPl 02/12/2024 1.6    IFE 1 02/12/2024 Comment (A)    Please Note 02/12/2024 Comment    Sodium 02/12/2024 138    Potassium 02/12/2024 2.9 (L)    Chloride 02/12/2024 102    CO2 02/12/2024 28    Glucose, Bld 02/12/2024 211 (H)    BUN 02/12/2024 11    Creatinine 02/12/2024 0.85    Calcium 02/12/2024 9.1    Total Protein 02/12/2024 6.6    Albumin 02/12/2024 4.2    AST 02/12/2024 11 (L)    ALT 02/12/2024 12    Alkaline Phosphatase 02/12/2024 50    Total Bilirubin 02/12/2024 0.7    GFR, Estimated 02/12/2024 >60    Anion gap 02/12/2024 8   Appointment on 01/15/2024  Component Date Value   WBC Count 01/15/2024 11.5 (H)    RBC 01/15/2024 5.67    Hemoglobin 01/15/2024 15.0    HCT 01/15/2024 45.3    MCV 01/15/2024 79.9 (L)    MCH 01/15/2024 26.5    MCHC 01/15/2024 33.1    RDW 01/15/2024 16.2 (H)    Platelet Count 01/15/2024 172    nRBC 01/15/2024 0.0    Neutrophils Relative % 01/15/2024 54    Neutro Abs  01/15/2024 6.3    Lymphocytes Relative 01/15/2024 31    Lymphs Abs 01/15/2024 3.5    Monocytes Relative 01/15/2024 13    Monocytes Absolute 01/15/2024 1.5 (H)    Eosinophils Relative 01/15/2024 1    Eosinophils Absolute 01/15/2024 0.1    Basophils Relative 01/15/2024 1    Basophils Absolute 01/15/2024 0.1    Immature Granulocytes 01/15/2024 0    Abs Immature Granulocytes 01/15/2024 0.04    Sodium 01/15/2024 137    Potassium 01/15/2024 3.4 (L)    Chloride 01/15/2024 104    CO2 01/15/2024 24    Glucose, Bld 01/15/2024 191 (H)    BUN 01/15/2024 13    Creatinine, Ser 01/15/2024 0.79    Calcium 01/15/2024 9.5    Total Protein 01/15/2024 7.1    Albumin 01/15/2024 4.1    AST 01/15/2024 13 (L)    ALT 01/15/2024 17    Alkaline Phosphatase 01/15/2024 70    Total Bilirubin 01/15/2024 0.6    GFR, Estimated 01/15/2024 >60    Anion gap 01/15/2024 9    IgG (Immunoglobin G), Se* 01/15/2024 784    IgA 01/15/2024 119    IgM (Immunoglobulin M), * 01/15/2024 25    Total Protein ELP 01/15/2024 6.5    Albumin SerPl Elph-Mcnc 01/15/2024 3.3    Alpha 1 01/15/2024 0.3    Alpha2 Glob SerPl Elph-M* 01/15/2024 1.2 (H)    B-Globulin SerPl Elph-Mc* 01/15/2024 1.0    Gamma Glob SerPl Elph-Mc* 01/15/2024 0.7    M Protein SerPl Elph-Mcnc 01/15/2024 0.3 (H)    Globulin, Total 01/15/2024 3.2    Albumin/Glob SerPl 01/15/2024 1.1    IFE 1 01/15/2024 Comment (A)    Please Note 01/15/2024 Comment   Appointment on 12/18/2023  Component Date Value   WBC Count 12/18/2023 10.4    RBC 12/18/2023 5.55    Hemoglobin 12/18/2023 14.4    HCT 12/18/2023 42.6    MCV 12/18/2023 76.8 (L)    MCH 12/18/2023 25.9 (L)    MCHC 12/18/2023 33.8    RDW 12/18/2023 15.7 (H)    Platelet Count 12/18/2023 384    nRBC 12/18/2023 0.0    Neutrophils Relative %  12/18/2023 73    Neutro Abs 12/18/2023 7.6    Lymphocytes Relative 12/18/2023 19    Lymphs Abs 12/18/2023 2.0    Monocytes Relative 12/18/2023 7    Monocytes Absolute  12/18/2023 0.7    Eosinophils Relative 12/18/2023 0    Eosinophils Absolute 12/18/2023 0.0    Basophils Relative 12/18/2023 0    Basophils Absolute 12/18/2023 0.0    Immature Granulocytes 12/18/2023 1    Abs Immature Granulocytes 12/18/2023 0.10 (H)    Sodium 12/18/2023 138    Potassium 12/18/2023 3.4 (L)    Chloride 12/18/2023 103    CO2 12/18/2023 25    Glucose, Bld 12/18/2023 234 (H)    BUN 12/18/2023 9    Creatinine, Ser 12/18/2023 0.69    Calcium 12/18/2023 9.0    Total Protein 12/18/2023 6.7    Albumin 12/18/2023 3.7    AST 12/18/2023 30    ALT 12/18/2023 39    Alkaline Phosphatase 12/18/2023 100    Total Bilirubin 12/18/2023 0.5    GFR, Estimated 12/18/2023 >60    Anion gap 12/18/2023 10    IgG (Immunoglobin G), Se* 12/18/2023 584 (L)    IgA 12/18/2023 106    IgM (Immunoglobulin M), * 12/18/2023 19 (L)    Total Protein ELP 12/18/2023 6.0    Albumin SerPl Elph-Mcnc 12/18/2023 2.8 (L)    Alpha 1 12/18/2023 0.4    Alpha2 Glob SerPl Elph-M* 12/18/2023 1.5 (H)    B-Globulin SerPl Elph-Mc* 12/18/2023 0.9    Gamma Glob SerPl Elph-Mc* 12/18/2023 0.4    M Protein SerPl Elph-Mcnc 12/18/2023 0.2 (H)    Globulin, Total 12/18/2023 3.2    Albumin/Glob SerPl 12/18/2023 0.9    IFE 1 12/18/2023 Comment (A)    Please Note 12/18/2023 Comment   Lab on 12/09/2023  Component Date Value   Hgb A1c MFr Bld 12/09/2023 8.4 (H)    Mean Plasma Glucose 12/09/2023 194    eAG (mmol/L) 12/09/2023 10.8   Office Visit on 12/09/2023  Component Date Value   Ferritin 12/09/2023 211.8    WBC 12/09/2023 7.7    RBC 12/09/2023 5.92 (H)    Hemoglobin 12/09/2023 15.6    HCT 12/09/2023 47.9    MCV 12/09/2023 81.0    MCHC 12/09/2023 32.5    RDW 12/09/2023 18.2 (H)    Platelets 12/09/2023 149.0 (L)    Neutrophils Relative % 12/09/2023 60.1    Lymphocytes Relative 12/09/2023 22.5    Monocytes Relative 12/09/2023 14.2 (H)    Eosinophils Relative 12/09/2023 2.1    Basophils Relative 12/09/2023 1.1     Neutro Abs 12/09/2023 4.6    Lymphs Abs 12/09/2023 1.7    Monocytes Absolute 12/09/2023 1.1 (H)    Eosinophils Absolute 12/09/2023 0.2    Basophils Absolute 12/09/2023 0.1    Sodium 12/09/2023 135    Potassium 12/09/2023 3.3 (L)    Chloride 12/09/2023 102    CO2 12/09/2023 22    Glucose, Bld 12/09/2023 195 (H)    BUN 12/09/2023 12    Creatinine, Ser 12/09/2023 0.75    Total Bilirubin 12/09/2023 0.7    Alkaline Phosphatase 12/09/2023 61    AST 12/09/2023 32    ALT 12/09/2023 26    Total Protein 12/09/2023 6.9    Albumin 12/09/2023 4.3    GFR 12/09/2023 100.57    Calcium 12/09/2023 8.4    Cholesterol 12/09/2023 186    Triglycerides 12/09/2023 90.0    HDL 12/09/2023 48.60    VLDL 12/09/2023 18.0  LDL Cholesterol 12/09/2023 120 (H)    Total CHOL/HDL Ratio 12/09/2023 4    NonHDL 12/09/2023 137.51    Microalb, Ur 12/09/2023 12.2 (H)    Creatinine,U 12/09/2023 133.2    Microalb Creat Ratio 12/09/2023 9.1   Appointment on 11/20/2023  Component Date Value   WBC Count 11/20/2023 7.1    RBC 11/20/2023 5.78    Hemoglobin 11/20/2023 14.9    HCT 11/20/2023 45.7    MCV 11/20/2023 79.1 (L)    MCH 11/20/2023 25.8 (L)    MCHC 11/20/2023 32.6    RDW 11/20/2023 18.7 (H)    Platelet Count 11/20/2023 131 (L)    nRBC 11/20/2023 0.0    Neutrophils Relative % 11/20/2023 40    Neutro Abs 11/20/2023 2.8    Lymphocytes Relative 11/20/2023 40    Lymphs Abs 11/20/2023 2.9    Monocytes Relative 11/20/2023 10    Monocytes Absolute 11/20/2023 0.7    Eosinophils Relative 11/20/2023 8    Eosinophils Absolute 11/20/2023 0.6 (H)    Basophils Relative 11/20/2023 2    Basophils Absolute 11/20/2023 0.1    Immature Granulocytes 11/20/2023 0    Abs Immature Granulocytes 11/20/2023 0.02    Sodium 11/20/2023 139    Potassium 11/20/2023 3.7    Chloride 11/20/2023 107    CO2 11/20/2023 26    Glucose, Bld 11/20/2023 266 (H)    BUN 11/20/2023 11    Creatinine, Ser 11/20/2023 0.77    Calcium 11/20/2023  9.0    Total Protein 11/20/2023 6.5    Albumin 11/20/2023 4.1    AST 11/20/2023 14 (L)    ALT 11/20/2023 19    Alkaline Phosphatase 11/20/2023 56    Total Bilirubin 11/20/2023 0.6    GFR, Estimated 11/20/2023 >60    Anion gap 11/20/2023 6    IgG (Immunoglobin G), Se* 11/20/2023 698    IgA 11/20/2023 150    IgM (Immunoglobulin M), * 11/20/2023 30    Total Protein ELP 11/20/2023 6.0    Albumin SerPl Elph-Mcnc 11/20/2023 3.6    Alpha 1 11/20/2023 0.3    Alpha2 Glob SerPl Elph-M* 11/20/2023 0.6    B-Globulin SerPl Elph-Mc* 11/20/2023 0.9    Gamma Glob SerPl Elph-Mc* 11/20/2023 0.6    M Protein SerPl Elph-Mcnc 11/20/2023 0.2 (H)    Globulin, Total 11/20/2023 2.4    Albumin/Glob SerPl 11/20/2023 1.6    IFE 1 11/20/2023 Comment (A)    Please Note 11/20/2023 Comment   There may be more visits with results that are not included.  No image results found. No results found.    Results Procedure: Continuous Glucose Monitor (CGM) Placement Description: The Libre sensor was applied to the patient's skin. The area was cleaned and dried thoroughly before application. The sensor was then connected to the patient's phone via Bluetooth.  LABS Continuous blood glucose monitor readings: 140, 130, 120 (04/08/2024)  PATHOLOGY Multiple myeloma status: in remission     Assessment & Plan Type 2 diabetes mellitus with hyperglycemia, without long-term current use of insulin (HCC) He experiences hyperglycemic episodes, especially postprandial spikes after consuming high-carbohydrate meals like rice. Current management with Farxiga and Mounjaro has improved average glucose levels, but dietary habits cause fluctuations. A continuous glucose monitor is in use to assess dietary impact on glycemic control. Reducing rice intake and substituting with cauliflower rice is recommended to stabilize glucose levels and potentially prolong multiple myeloma remission. Educate him on continuous glucose monitor usage and  phone connectivity. Advise dietary modifications, specifically reducing rice intake  and substituting with cauliflower rice. Encourage glucose monitor use to identify hyperglycemic triggers. Discuss potential benefits of consulting a diabetes nutritionist for dietary guidance. Review glucose readings in two weeks to evaluate dietary changes.  Take: dapagliflozin propanediol (FARXIGA) 10 MG TABS tablet (Taking)  metFORMIN (GLUCOPHAGE) 500 MG tablet (Taking)  tirzepatide (MOUNJARO) 10 MG/0.5ML Pen (Taking)   Diabetes is currently well controlled, based on available Hemoglobin A1c and glucose monitoring data listed in problem overview.  Diabetic education: ongoing education regarding chronic disease management for diabetes was given today. We continue to reinforce the ABC's of diabetic management: A1c (<7 or 8 dependent upon patient), tight blood pressure control, and cholesterol management with goal LDL < 100 minimally. We discuss diet strategies, exercise recommendations, medication options and possible side effects. At each visit, we review recommended immunizations and preventive care recommendations for diabetics and stress that good diabetic control can prevent other problems.  Importance of regular foot checks and yearly eye exams has been reinforced and is included here for a reminder   Z79.84-long term or current use of oral hypoglycemic drugs  and Z79.85-long-term or current use of injectable non-insulin antidiabetic drugs  Last foot exam: 08/19/2022 will do at our next visit  Multiple myeloma in remission [facility]) Multiple myeloma is in remission, maintained with Revlimid. Hyperglycemia may impact remission status. Effective glycemic control may prolong remission and survival, while persistent hyperglycemia could be life-threatening if myeloma recurs. Continue Revlimid as prescribed. Monitor blood glucose closely to potentially prolong remission. Language barrier affecting health  care Interpreter used, and additional time was spent to ensure patient understood.  I had to personally demonstrate how to apply and set up continuous glucose monitor with his phone as he could not understand.  I set it up so he could come back and we could review the results together since he will struggle to use interpret the continuous glucose monitor readings. Visit took longer Recording duration: 35 minutes   General Health Maintenance   Focus on dietary changes to manage diabetes and potentially prolong multiple myeloma remission. Emphasis on reducing carbohydrate intake and increasing awareness of food choices. Low-carbohydrate options, including cauliflower-based products, and low-sugar snacks and drinks like Glucerna are recommended to maintain stable glucose levels. Educate on low-carbohydrate food options, including cauliflower-based products. Encourage low-sugar snacks and drinks, such as Glucerna, to maintain stable glucose levels.  Follow-up   A follow-up is necessary to review glucose readings and assess dietary changes' effectiveness. Medication adjustments will be considered based on glucose monitor data. Schedule a follow-up in two weeks to review glucose monitor data and discuss potential medication adjustments.      Orders Placed During this Encounter:  No orders of the defined types were placed in this encounter.  No orders of the defined types were placed in this encounter.  ED Discharge Orders          Ordered    Amb ref to Medical Nutrition Therapy-MNT  Status:  Canceled        04/08/24 1036          He did not want this although I encouraged it.        **This document was synthesized by artificial intelligence (Abridge) using HIPAA-compliant recording of the clinical interaction;   We discussed the use of AI scribe software for clinical note transcription with the patient, who gave verbal consent to proceed.    Additional Info: This encounter employed  state-of-the-art, real-time, collaborative documentation. The patient actively reviewed and assisted in  updating their electronic medical record on a shared screen, ensuring transparency and facilitating joint problem-solving for the problem list, overview, and plan. This approach promotes accurate, informed care. The treatment plan was discussed and reviewed in detail, including medication safety, potential side effects, and all patient questions. We confirmed understanding and comfort with the plan. Follow-up instructions were established, including contacting the office for any concerns, returning if symptoms worsen, persist, or new symptoms develop, and precautions for potential emergency department visits.

## 2024-04-08 NOTE — Patient Instructions (Addendum)
 VISIT SUMMARY:  You visited today to set up a continuous glucose monitor (CGM) to help manage your diabetes and monitor your blood sugar levels, especially after meals. We discussed your current medications and dietary habits, and you are motivated to make changes to better control your blood sugar and support your multiple myeloma remission.  YOUR PLAN:  -TYPE 2 DIABETES MELLITUS WITH HYPERGLYCEMIA: Type 2 diabetes is a condition where your body does not use insulin properly, leading to high blood sugar levels. You are experiencing spikes in your blood sugar, especially after eating high-carbohydrate foods like rice. We recommend reducing your rice intake and substituting it with cauliflower rice to help stabilize your glucose levels. Continue using your continuous glucose monitor to track your blood sugar and identify foods that cause spikes. Consider consulting a diabetes nutritionist for more dietary guidance. We will review your glucose readings in two weeks to see how these changes are working.  -MULTIPLE MYELOMA IN REMISSION: Multiple myeloma is a type of blood cancer that is currently in remission due to your treatment with Revlimid. Keeping your blood sugar under control is important to help maintain this remission. Continue taking Revlimid as prescribed and monitor your blood glucose closely to support your remission.  -GENERAL HEALTH MAINTENANCE: Focus on making dietary changes to manage your diabetes and support your multiple myeloma remission. Reduce your carbohydrate intake and choose low-carbohydrate options like cauliflower-based products. Consider low-sugar snacks and drinks, such as Glucerna, to help maintain stable glucose levels.  INSTRUCTIONS:  Please schedule a follow-up appointment in two weeks to review your glucose monitor data and discuss any potential medication adjustments.  It was a pleasure seeing you today! Your health and satisfaction are our top priorities.   Glenetta Hew, MD  Your Providers PCP: Lula Olszewski, MD,  [phone]) Referring Provider: Lula Olszewski, MD,  [phone]) Care Team Provider: Johney Maine, MD,  [phone])     NEXT STEPS: [x]  Early Intervention: Schedule sooner appointment, call our on-call services, or go to emergency room if there is any significant Increase in pain or discomfort New or worsening symptoms Sudden or severe changes in your health [x]  Flexible Follow-Up: We recommend a Return in about 2 weeks (around 04/22/2024) for close fu cgm mgmt and dm . for optimal routine care. This allows for progress monitoring and treatment adjustments. [x]  Preventive Care: Schedule your annual preventive care visit! It's typically covered by insurance and helps identify potential health issues early. [x]  Lab & X-ray Appointments: Incomplete tests scheduled today, or call to schedule. X-rays: [facility] (M-F, 8:30am-noon or 1pm-5pm). [x]  Medical Information Release: Sign a release form at front desk to obtain relevant medical information we don't have.  MAKING THE MOST OF OUR FOCUSED 20 MINUTE APPOINTMENTS: [x]   Clearly state your top concerns at the beginning of the visit to focus our discussion [x]   If you anticipate you will need more time, please inform the front desk during scheduling - we can book multiple appointments in the same week. [x]   If you have transportation problems- use our convenient video appointments or ask about transportation support. [x]   We can get down to business faster if you use MyChart to update information before the visit and submit non-urgent questions before your visit. Thank you for taking the time to provide details through MyChart.  Let our nurse know and she can import this information into your encounter documents.  Arrival and Wait Times: [x]   Arriving on  time ensures that everyone receives prompt attention. [x]   Early morning (8a) and afternoon  (1p) appointments tend to have shortest wait times. [x]   Unfortunately, we cannot delay appointments for late arrivals or hold slots during phone calls.  Getting Answers and Following Up [x]   Simple Questions & Concerns: For quick questions or basic follow-up after your visit, reach us  at [phone] or MyChart messaging. [x]   Complex Concerns: If your concern is more complex, scheduling an appointment might be best. Discuss this with the staff to find the most suitable option. [x]   Lab & Imaging Results: We'll contact you directly if results are abnormal or you don't use MyChart. Most normal results will be on MyChart within 2-3 business days, with a review message from Dr. Boston Byers. Haven't heard back in 2 weeks? Need results sooner? Contact us  at [phone]. [x]   Referrals: Our referral coordinator will manage specialist referrals. The specialist's office should contact you within 2 weeks to schedule an appointment. Call us  if you haven't heard from them after 2 weeks.  Staying Connected [x]   MyChart: Activate your MyChart for the fastest way to access results and message us . See the last page of this paperwork for instructions on how to activate.  Bring to Your Next Appointment [x]   Medications: Please bring all your medication bottles to your next appointment to ensure we have an accurate record of your prescriptions. [x]   Health Diaries: If you're monitoring any health conditions at home, keeping a diary of your readings can be very helpful for discussions at your next appointment.  Billing [x]   X-ray & Lab Orders: These are billed by separate companies. Contact the invoicing company directly for questions or concerns. [x]   Visit Charges: Discuss any billing inquiries with our administrative services team.  Your Satisfaction Matters [x]   Share Your Experience: We strive for your satisfaction! If you have any complaints, or preferably compliments, please let Dr. Boston Byers know  directly or contact our Practice Administrators, Olinda Bertrand or [employer], by asking at the front desk.   Reviewing Your Records [x]   Review this early draft of your clinical encounter notes below and the final encounter summary tomorrow on MyChart after its been completed.  All orders placed so far are visible here: Diabetes mellitus with proteinuria (HCC)  Type 2 diabetes mellitus with hyperglycemia, without long-term current use of insulin (HCC)  Multiple myeloma in remission [facility])  Language barrier affecting health care

## 2024-04-09 ENCOUNTER — Other Ambulatory Visit: Payer: Self-pay

## 2024-04-10 ENCOUNTER — Encounter: Payer: Self-pay | Admitting: Internal Medicine

## 2024-04-10 NOTE — Assessment & Plan Note (Signed)
 He experiences hyperglycemic episodes, especially postprandial spikes after consuming high-carbohydrate meals like rice. Current management with Farxiga and Mounjaro has improved average glucose levels, but dietary habits cause fluctuations. A continuous glucose monitor is in use to assess dietary impact on glycemic control. Reducing rice intake and substituting with cauliflower rice is recommended to stabilize glucose levels and potentially prolong multiple myeloma remission. Educate him on continuous glucose monitor usage and phone connectivity. Advise dietary modifications, specifically reducing rice intake and substituting with cauliflower rice. Encourage glucose monitor use to identify hyperglycemic triggers. Discuss potential benefits of consulting a diabetes nutritionist for dietary guidance. Review glucose readings in two weeks to evaluate dietary changes.  Take: dapagliflozin propanediol (FARXIGA) 10 MG TABS tablet (Taking)  metFORMIN (GLUCOPHAGE) 500 MG tablet (Taking)  tirzepatide (MOUNJARO) 10 MG/0.5ML Pen (Taking)   Diabetes is currently well controlled, based on available Hemoglobin A1c and glucose monitoring data listed in problem overview.  Diabetic education: ongoing education regarding chronic disease management for diabetes was given today. We continue to reinforce the ABC's of diabetic management: A1c (<7 or 8 dependent upon patient), tight blood pressure control, and cholesterol management with goal LDL < 100 minimally. We discuss diet strategies, exercise recommendations, medication options and possible side effects. At each visit, we review recommended immunizations and preventive care recommendations for diabetics and stress that good diabetic control can prevent other problems.  Importance of regular foot checks and yearly eye exams has been reinforced and is included here for a reminder   Z79.84-long term or current use of oral hypoglycemic drugs  and Z79.85-long-term or current  use of injectable non-insulin antidiabetic drugs  Last foot exam: 08/19/2022 will do at our next visit

## 2024-04-10 NOTE — Assessment & Plan Note (Signed)
 Interpreter used, and additional time was spent to ensure patient understood.  I had to personally demonstrate how to apply and set up continuous glucose monitor with his phone as he could not understand.  I set it up so he could come back and we could review the results together since he will struggle to use interpret the continuous glucose monitor readings. Visit took longer Recording duration: 35 minutes

## 2024-04-10 NOTE — Assessment & Plan Note (Signed)
 Multiple myeloma is in remission, maintained with Revlimid. Hyperglycemia may impact remission status. Effective glycemic control may prolong remission and survival, while persistent hyperglycemia could be life-threatening if myeloma recurs. Continue Revlimid as prescribed. Monitor blood glucose closely to potentially prolong remission.

## 2024-04-15 ENCOUNTER — Inpatient Hospital Stay

## 2024-04-15 VITALS — BP 140/89 | HR 66 | Temp 97.7°F | Resp 16 | Wt 171.0 lb

## 2024-04-15 DIAGNOSIS — C9001 Multiple myeloma in remission: Secondary | ICD-10-CM

## 2024-04-15 DIAGNOSIS — Z5112 Encounter for antineoplastic immunotherapy: Secondary | ICD-10-CM | POA: Diagnosis present

## 2024-04-15 DIAGNOSIS — Z7189 Other specified counseling: Secondary | ICD-10-CM

## 2024-04-15 DIAGNOSIS — C9000 Multiple myeloma not having achieved remission: Secondary | ICD-10-CM | POA: Diagnosis not present

## 2024-04-15 LAB — CBC WITH DIFFERENTIAL (CANCER CENTER ONLY)
Abs Immature Granulocytes: 0.01 10*3/uL (ref 0.00–0.07)
Basophils Absolute: 0.1 10*3/uL (ref 0.0–0.1)
Basophils Relative: 1 %
Eosinophils Absolute: 0.3 10*3/uL (ref 0.0–0.5)
Eosinophils Relative: 6 %
HCT: 41.4 % (ref 39.0–52.0)
Hemoglobin: 14.1 g/dL (ref 13.0–17.0)
Immature Granulocytes: 0 %
Lymphocytes Relative: 47 %
Lymphs Abs: 2.3 10*3/uL (ref 0.7–4.0)
MCH: 26.9 pg (ref 26.0–34.0)
MCHC: 34.1 g/dL (ref 30.0–36.0)
MCV: 78.9 fL — ABNORMAL LOW (ref 80.0–100.0)
Monocytes Absolute: 0.6 10*3/uL (ref 0.1–1.0)
Monocytes Relative: 11 %
Neutro Abs: 1.8 10*3/uL (ref 1.7–7.7)
Neutrophils Relative %: 35 %
Platelet Count: 201 10*3/uL (ref 150–400)
RBC: 5.25 MIL/uL (ref 4.22–5.81)
RDW: 14.9 % (ref 11.5–15.5)
WBC Count: 5 10*3/uL (ref 4.0–10.5)
nRBC: 0 % (ref 0.0–0.2)

## 2024-04-15 LAB — COMPREHENSIVE METABOLIC PANEL WITH GFR
ALT: 15 U/L (ref 0–44)
AST: 16 U/L (ref 15–41)
Albumin: 3.9 g/dL (ref 3.5–5.0)
Alkaline Phosphatase: 44 U/L (ref 38–126)
Anion gap: 7 (ref 5–15)
BUN: 12 mg/dL (ref 6–20)
CO2: 24 mmol/L (ref 22–32)
Calcium: 8.3 mg/dL — ABNORMAL LOW (ref 8.9–10.3)
Chloride: 109 mmol/L (ref 98–111)
Creatinine, Ser: 0.69 mg/dL (ref 0.61–1.24)
GFR, Estimated: >60 mL/min (ref >60)
Glucose, Bld: 169 mg/dL — ABNORMAL HIGH (ref 70–99)
Potassium: 3.3 mmol/L — ABNORMAL LOW (ref 3.5–5.1)
Sodium: 140 mmol/L (ref 135–145)
Total Bilirubin: 0.5 mg/dL (ref 0.0–1.2)
Total Protein: 5.9 g/dL — ABNORMAL LOW (ref 6.5–8.1)

## 2024-04-15 MED ORDER — SODIUM CHLORIDE 0.9 % IV SOLN
16.0000 mg/kg | Freq: Once | INTRAVENOUS | Status: AC
  Administered 2024-04-15: 1300 mg via INTRAVENOUS
  Filled 2024-04-15: qty 5

## 2024-04-15 MED ORDER — DIPHENHYDRAMINE HCL 25 MG PO CAPS
50.0000 mg | ORAL_CAPSULE | Freq: Once | ORAL | Status: AC
  Administered 2024-04-15: 50 mg via ORAL
  Filled 2024-04-15: qty 2

## 2024-04-15 MED ORDER — MONTELUKAST SODIUM 10 MG PO TABS
10.0000 mg | ORAL_TABLET | Freq: Once | ORAL | Status: AC
  Administered 2024-04-15: 10 mg via ORAL
  Filled 2024-04-15: qty 1

## 2024-04-15 MED ORDER — DEXAMETHASONE SODIUM PHOSPHATE 10 MG/ML IJ SOLN
2.0000 mg | Freq: Once | INTRAMUSCULAR | Status: AC
  Administered 2024-04-15: 2 mg via INTRAVENOUS
  Filled 2024-04-15: qty 1

## 2024-04-15 MED ORDER — FAMOTIDINE IN NACL 20-0.9 MG/50ML-% IV SOLN
20.0000 mg | Freq: Once | INTRAVENOUS | Status: AC
  Administered 2024-04-15: 20 mg via INTRAVENOUS
  Filled 2024-04-15: qty 50

## 2024-04-15 MED ORDER — SODIUM CHLORIDE 0.9 % IV SOLN
Freq: Once | INTRAVENOUS | Status: AC
Start: 2024-04-15 — End: 2024-04-15

## 2024-04-15 MED ORDER — ACETAMINOPHEN 325 MG PO TABS
650.0000 mg | ORAL_TABLET | Freq: Once | ORAL | Status: AC
  Administered 2024-04-15: 650 mg via ORAL
  Filled 2024-04-15: qty 2

## 2024-04-15 NOTE — Patient Instructions (Signed)
 [facility] - A DEPT OF [facility]  Discharge Instructions: Thank you for choosing [facility] to provide your oncology and hematology care.   If you have a lab appointment with the Cancer Center, please go directly to the Cancer Center and check in at the registration area.   Wear comfortable clothing and clothing appropriate for easy access to any Portacath or PICC line.   We strive to give you quality time with your provider. You may need to reschedule your appointment if you arrive late (15 or more minutes).  Arriving late affects you and other patients whose appointments are after yours.  Also, if you miss three or more appointments without notifying the office, you may be dismissed from the clinic at the provider's discretion.      For prescription refill requests, have your pharmacy contact our office and allow 72 hours for refills to be completed.    Today you received the following chemotherapy and/or immunotherapy agents: Daratumumab.       To help prevent nausea and vomiting after your treatment, we encourage you to take your nausea medication as directed.  BELOW ARE SYMPTOMS THAT SHOULD BE REPORTED IMMEDIATELY: *FEVER GREATER THAN 100.4 F (38 C) OR HIGHER *CHILLS OR SWEATING *NAUSEA AND VOMITING THAT IS NOT CONTROLLED WITH YOUR NAUSEA MEDICATION *UNUSUAL SHORTNESS OF BREATH *UNUSUAL BRUISING OR BLEEDING *URINARY PROBLEMS (pain or burning when urinating, or frequent urination) *BOWEL PROBLEMS (unusual diarrhea, constipation, pain near the anus) TENDERNESS IN MOUTH AND THROAT WITH OR WITHOUT PRESENCE OF ULCERS (sore throat, sores in mouth, or a toothache) UNUSUAL RASH, SWELLING OR PAIN  UNUSUAL VAGINAL DISCHARGE OR ITCHING   Items with * indicate a potential emergency and should be followed up as soon as possible or go to the Emergency Department if any problems should occur.  Please show the CHEMOTHERAPY ALERT CARD or  IMMUNOTHERAPY ALERT CARD at check-in to the Emergency Department and triage nurse.  Should you have questions after your visit or need to cancel or reschedule your appointment, please contact [facility] - A DEPT OF [facility]  Dept: [phone]  and follow the prompts.  Office hours are 8:00 a.m. to 4:30 p.m. Monday - Friday. Please note that voicemails left after 4:00 p.m. may not be returned until the following business day.  We are closed weekends and major holidays. You have access to a nurse at all times for urgent questions. Please call the main number to the clinic Dept: [phone] and follow the prompts.   For any non-urgent questions, you may also contact your provider using MyChart. We now offer e-Visits for anyone 55 and older to request care online for non-urgent symptoms. For details visit [email or web address].   Also download the MyChart app! Go to the app store, search "MyChart", open the app, select , and log in with your MyChart username and password.  Zoledronic Acid Injection (Cancer) What is this medication? ZOLEDRONIC ACID (ZOE le dron ik AS id) treats high calcium levels in the blood caused by cancer. It may also be used with chemotherapy to treat weakened bones caused by cancer. It works by slowing down the release of calcium from bones. This lowers calcium levels in your blood. It also makes your bones stronger and less likely to break (fracture). It belongs to a group of medications called bisphosphonates. This medicine may be used for other purposes; ask your health care  provider or pharmacist if you have questions. COMMON BRAND NAME(S): Zometa, Zometa Powder What should I tell my care team before I take this medication? They need to know if you have any of these conditions: Dehydration Dental disease Kidney disease Liver disease Low levels of calcium in the blood Lung or breathing disease, such as asthma Receiving  steroids, such as dexamethasone or prednisone An unusual or allergic reaction to zoledronic acid, other medications, foods, dyes, or preservatives Pregnant or trying to get pregnant Breast-feeding How should I use this medication? This medication is injected into a vein. It is given by your care team in a hospital or clinic setting. Talk to your care team about the use of this medication in children. Special care may be needed. Overdosage: If you think you have taken too much of this medicine contact a poison control center or emergency room at once. NOTE: This medicine is only for you. Do not share this medicine with others. What if I miss a dose? Keep appointments for follow-up doses. It is important not to miss your dose. Call your care team if you are unable to keep an appointment. What may interact with this medication? Certain antibiotics given by injection Diuretics, such as bumetanide, furosemide NSAIDs, medications for pain and inflammation, such as ibuprofen or naproxen Teriparatide Thalidomide This list may not describe all possible interactions. Give your health care provider a list of all the medicines, herbs, non-prescription drugs, or dietary supplements you use. Also tell them if you smoke, drink alcohol, or use illegal drugs. Some items may interact with your medicine. What should I watch for while using this medication? Visit your care team for regular checks on your progress. It may be some time before you see the benefit from this medication. Some people who take this medication have severe bone, joint, or muscle pain. This medication may also increase your risk for jaw problems or a broken thigh bone. Tell your care team right away if you have severe pain in your jaw, bones, joints, or muscles. Tell you care team if you have any pain that does not go away or that gets worse. Tell your dentist and dental surgeon that you are taking this medication. You should not have major  dental surgery while on this medication. See your dentist to have a dental exam and fix any dental problems before starting this medication. Take good care of your teeth while on this medication. Make sure you see your dentist for regular follow-up appointments. You should make sure you get enough calcium and vitamin D while you are taking this medication. Discuss the foods you eat and the vitamins you take with your care team. Check with your care team if you have severe diarrhea, nausea, and vomiting, or if you sweat a lot. The loss of too much body fluid may make it dangerous for you to take this medication. You may need bloodwork while taking this medication. Talk to your care team if you wish to become pregnant or think you might be pregnant. This medication can cause serious birth defects. What side effects may I notice from receiving this medication? Side effects that you should report to your care team as soon as possible: Allergic reactions--skin rash, itching, hives, swelling of the face, lips, tongue, or throat Kidney injury--decrease in the amount of urine, swelling of the ankles, hands, or feet Low calcium level--muscle pain or cramps, confusion, tingling, or numbness in the hands or feet Osteonecrosis of the jaw--pain, swelling, or  redness in the mouth, numbness of the jaw, poor healing after dental work, unusual discharge from the mouth, visible bones in the mouth Severe bone, joint, or muscle pain Side effects that usually do not require medical attention (report to your care team if they continue or are bothersome): Constipation Fatigue Fever Loss of appetite Nausea Stomach pain This list may not describe all possible side effects. Call your doctor for medical advice about side effects. You may report side effects to FDA at 1-800-FDA-1088. Where should I keep my medication? This medication is given in a hospital or clinic. It will not be stored at home. NOTE: This sheet is a  summary. It may not cover all possible information. If you have questions about this medicine, talk to your doctor, pharmacist, or health care provider.  2024 [employer] Standard (2022-02-07 00:00:00)

## 2024-04-18 LAB — MULTIPLE MYELOMA PANEL, SERUM
Albumin SerPl Elph-Mcnc: 3.4 g/dL (ref 2.9–4.4)
Albumin/Glob SerPl: 1.6 (ref 0.7–1.7)
Alpha 1: 0.1 g/dL (ref 0.0–0.4)
Alpha2 Glob SerPl Elph-Mcnc: 0.7 g/dL (ref 0.4–1.0)
B-Globulin SerPl Elph-Mcnc: 0.7 g/dL (ref 0.7–1.3)
Gamma Glob SerPl Elph-Mcnc: 0.6 g/dL (ref 0.4–1.8)
Globulin, Total: 2.2 g/dL (ref 2.2–3.9)
IgA: 137 mg/dL (ref 90–386)
IgG (Immunoglobin G), Serum: 647 mg/dL (ref 603–1613)
IgM (Immunoglobulin M), Srm: 20 mg/dL (ref 20–172)
M Protein SerPl Elph-Mcnc: 0.3 g/dL — ABNORMAL HIGH
Total Protein ELP: 5.6 g/dL — ABNORMAL LOW (ref 6.0–8.5)

## 2024-04-22 ENCOUNTER — Ambulatory Visit: Admitting: Hematology

## 2024-04-22 ENCOUNTER — Ambulatory Visit

## 2024-04-22 ENCOUNTER — Other Ambulatory Visit

## 2024-04-27 ENCOUNTER — Other Ambulatory Visit: Payer: Self-pay | Admitting: Hematology

## 2024-04-27 DIAGNOSIS — C9000 Multiple myeloma not having achieved remission: Secondary | ICD-10-CM

## 2024-04-28 ENCOUNTER — Encounter: Payer: Self-pay | Admitting: Hematology

## 2024-04-28 NOTE — Progress Notes (Signed)
 Pt given Revlimid  on 04/15/24. It is delivered to the [facility]. Pt to start taking on 04/15/24 - 05/05/24. Pt will take off 05/06/24 - 05/12/24. New cycle will start on 05/13/24. Pt aware when to start this medication.

## 2024-04-29 ENCOUNTER — Inpatient Hospital Stay: Attending: Hematology

## 2024-04-29 ENCOUNTER — Inpatient Hospital Stay: Admitting: Hematology

## 2024-04-29 ENCOUNTER — Inpatient Hospital Stay

## 2024-04-29 VITALS — BP 129/80 | HR 67 | Temp 97.5°F | Resp 17 | Ht 66.0 in | Wt 168.9 lb

## 2024-04-29 DIAGNOSIS — C9000 Multiple myeloma not having achieved remission: Secondary | ICD-10-CM | POA: Insufficient documentation

## 2024-04-29 DIAGNOSIS — Z5112 Encounter for antineoplastic immunotherapy: Secondary | ICD-10-CM | POA: Insufficient documentation

## 2024-04-29 DIAGNOSIS — Z7189 Other specified counseling: Secondary | ICD-10-CM

## 2024-04-29 DIAGNOSIS — G629 Polyneuropathy, unspecified: Secondary | ICD-10-CM | POA: Diagnosis not present

## 2024-04-29 DIAGNOSIS — E876 Hypokalemia: Secondary | ICD-10-CM

## 2024-04-29 DIAGNOSIS — D696 Thrombocytopenia, unspecified: Secondary | ICD-10-CM | POA: Insufficient documentation

## 2024-04-29 DIAGNOSIS — C9001 Multiple myeloma in remission: Secondary | ICD-10-CM

## 2024-04-29 DIAGNOSIS — Z7984 Long term (current) use of oral hypoglycemic drugs: Secondary | ICD-10-CM | POA: Insufficient documentation

## 2024-04-29 DIAGNOSIS — Z7985 Long-term (current) use of injectable non-insulin antidiabetic drugs: Secondary | ICD-10-CM | POA: Diagnosis not present

## 2024-04-29 DIAGNOSIS — D649 Anemia, unspecified: Secondary | ICD-10-CM | POA: Insufficient documentation

## 2024-04-29 DIAGNOSIS — R809 Proteinuria, unspecified: Secondary | ICD-10-CM | POA: Diagnosis not present

## 2024-04-29 DIAGNOSIS — E1142 Type 2 diabetes mellitus with diabetic polyneuropathy: Secondary | ICD-10-CM | POA: Insufficient documentation

## 2024-04-29 LAB — CBC WITH DIFFERENTIAL (CANCER CENTER ONLY)
Abs Immature Granulocytes: 0.01 10*3/uL (ref 0.00–0.07)
Basophils Absolute: 0.1 10*3/uL (ref 0.0–0.1)
Basophils Relative: 2 %
Eosinophils Absolute: 0.3 10*3/uL (ref 0.0–0.5)
Eosinophils Relative: 7 %
HCT: 42.2 % (ref 39.0–52.0)
Hemoglobin: 14.5 g/dL (ref 13.0–17.0)
Immature Granulocytes: 0 %
Lymphocytes Relative: 37 %
Lymphs Abs: 1.9 10*3/uL (ref 0.7–4.0)
MCH: 27.1 pg (ref 26.0–34.0)
MCHC: 34.4 g/dL (ref 30.0–36.0)
MCV: 78.7 fL — ABNORMAL LOW (ref 80.0–100.0)
Monocytes Absolute: 0.7 10*3/uL (ref 0.1–1.0)
Monocytes Relative: 14 %
Neutro Abs: 2 10*3/uL (ref 1.7–7.7)
Neutrophils Relative %: 40 %
Platelet Count: 143 10*3/uL — ABNORMAL LOW (ref 150–400)
RBC: 5.36 MIL/uL (ref 4.22–5.81)
RDW: 15.1 % (ref 11.5–15.5)
WBC Count: 5 10*3/uL (ref 4.0–10.5)
nRBC: 0 % (ref 0.0–0.2)

## 2024-04-29 LAB — COMPREHENSIVE METABOLIC PANEL WITH GFR
ALT: 27 U/L (ref 0–44)
AST: 18 U/L (ref 15–41)
Albumin: 4 g/dL (ref 3.5–5.0)
Alkaline Phosphatase: 51 U/L (ref 38–126)
Anion gap: 5 (ref 5–15)
BUN: 13 mg/dL (ref 6–20)
CO2: 27 mmol/L (ref 22–32)
Calcium: 8.4 mg/dL — ABNORMAL LOW (ref 8.9–10.3)
Chloride: 109 mmol/L (ref 98–111)
Creatinine, Ser: 0.72 mg/dL (ref 0.61–1.24)
GFR, Estimated: >60 mL/min (ref >60)
Glucose, Bld: 139 mg/dL — ABNORMAL HIGH (ref 70–99)
Potassium: 3 mmol/L — ABNORMAL LOW (ref 3.5–5.1)
Sodium: 141 mmol/L (ref 135–145)
Total Bilirubin: 0.6 mg/dL (ref 0.0–1.2)
Total Protein: 6 g/dL — ABNORMAL LOW (ref 6.5–8.1)

## 2024-04-29 MED ORDER — ACETAMINOPHEN 325 MG PO TABS
650.0000 mg | ORAL_TABLET | Freq: Once | ORAL | Status: AC
  Administered 2024-04-29: 650 mg via ORAL
  Filled 2024-04-29: qty 2

## 2024-04-29 MED ORDER — SODIUM CHLORIDE 0.9 % IV SOLN: Freq: Once | INTRAVENOUS | Status: AC

## 2024-04-29 MED ORDER — DEXAMETHASONE SODIUM PHOSPHATE 10 MG/ML IJ SOLN
2.0000 mg | Freq: Once | INTRAMUSCULAR | Status: AC
  Administered 2024-04-29: 2 mg via INTRAVENOUS
  Filled 2024-04-29: qty 1

## 2024-04-29 MED ORDER — DIPHENHYDRAMINE HCL 25 MG PO CAPS
50.0000 mg | ORAL_CAPSULE | Freq: Once | ORAL | Status: AC
  Administered 2024-04-29: 50 mg via ORAL
  Filled 2024-04-29: qty 2

## 2024-04-29 MED ORDER — MONTELUKAST SODIUM 10 MG PO TABS
10.0000 mg | ORAL_TABLET | Freq: Once | ORAL | Status: AC
  Administered 2024-04-29: 10 mg via ORAL
  Filled 2024-04-29: qty 1

## 2024-04-29 MED ORDER — FAMOTIDINE IN NACL 20-0.9 MG/50ML-% IV SOLN
20.0000 mg | Freq: Once | INTRAVENOUS | Status: AC
Start: 2024-04-29 — End: 2024-04-29
  Administered 2024-04-29: 20 mg via INTRAVENOUS
  Filled 2024-04-29: qty 50

## 2024-04-29 MED ORDER — DARATUMUMAB CHEMO INJECTION 400 MG/20ML
16.0000 mg/kg | Freq: Once | INTRAVENOUS | Status: AC
  Administered 2024-04-29: 1300 mg via INTRAVENOUS
  Filled 2024-04-29: qty 5

## 2024-04-29 MED ORDER — ZOLEDRONIC ACID 4 MG/100ML IV SOLN
4.0000 mg | Freq: Once | INTRAVENOUS | Status: AC
  Administered 2024-04-29: 4 mg via INTRAVENOUS
  Filled 2024-04-29: qty 100

## 2024-04-29 NOTE — Progress Notes (Signed)
 Patient seen by Dr. Minnie Amber are within treatment parameters.  Labs reviewed: and are not all within treatment parameters.    K: 3.0 Dr Salomon Cree aware. Pt to increase po K at home and pt to get 1 dose of K today in infusion/ Under signed and held  Per physician team, patient is ready for treatment and there are NO modifications to the treatment plan. Please note: Pt to get PO K

## 2024-04-29 NOTE — Progress Notes (Signed)
 HEMATOLOGY/ONCOLOGY CLINIC NOTE  Date of Service: 04/29/24  Chief complaint - Follow-up for continued evaluation and management of high risk multiple myeloma  Current treatment-daratumumab /Revlimid /dexamethasone  Has not f/u on and does not appear keen to consider Melphalan/HDT-AUTOHSCT  INTERVAL HISTORY:  Donald Suarez is a 58 y.o. male is here for continued evaluation and management of his multiple myeloma. Patient was last seen by me on 03/25/2024 and was doing well overall with no new medical complaints.   Today, he is scheduled to receive cycle 20 day 16 of treatment. Patient is accompanied by an interpreter.   He reports that the has been eating and sleeping well. Patient has normal energy levels. He reports that he has been staying well-hydrated.   Patient has been tolerating treatment well with no new or major toxicity issues. He has been receiving his  medication on time.   He denies any concern for fever, chills, night sweats, new bone pain, new leg swelling, changes in bowel habits, or infection issues.  Patient noted to have low potassium at 3.0 mmol/L. He takes one tablet of potassium replacement once daily, instead of twice daily as prescribed.   He denies any other medication changes by any other doctors.   MEDICAL HISTORY:  Past Medical History:  Diagnosis Date   Abfraction 08/06/2022   Accretions on teeth 08/06/2022   Anemia    Attrition, teeth excessive 08/06/2022   Caries 08/06/2022   Defective dental restoration 08/06/2022   Elevated ferritin 08/07/2022   Hypocalcemia 08/19/2022   In setting of multiple myeloma    Loose, teeth 08/06/2022   Malocclusion 08/06/2022   Microalbuminuria 08/19/2022   Periodontal disease 08/06/2022   Peripheral neuropathy 09/08/2022   Started 08/2022 A/w diabetes and revlimid  usage Burning on top of left foot   Teeth missing 08/06/2022    SURGICAL HISTORY: Past Surgical History:  Procedure Laterality Date   APPENDECTOMY      [place] BONE MARROW BIOPSY & ASPIRATION  02/11/2023   [place] FLUORO GUIDE CV LINE RIGHT  05/15/2022   [place] PATIENT EVAL TECH 0-60 MINS  05/19/2022   [facility] US  GUIDE VASC ACCESS RIGHT  05/15/2022    SOCIAL HISTORY: Social History   Socioeconomic History   Marital status: Married    Spouse name: Not on file   Number of children: Not on file   Years of education: Not on file   Highest education level: Not on file  Occupational History   Not on file  Tobacco Use   Smoking status: Never   Smokeless tobacco: Never  Vaping Use   Vaping status: Never Used  Substance and Sexual Activity   Alcohol use: Never   Drug use: Never   Sexual activity: Not on file  Other Topics Concern   Not on file  Social History Narrative   Not on file   Social Drivers of Health   Financial Resource Strain: Medium Risk (05/21/2022)   Overall Financial Resource Strain (CARDIA)    Difficulty of Paying Living Expenses: Somewhat hard  Food Insecurity: Food Insecurity Present (05/21/2022)   Hunger Vital Sign    Worried About Running Out of Food in the Last Year: Sometimes true    Ran Out of Food in the Last Year: Sometimes true  Transportation Needs: No Transportation Needs (05/21/2022)   PRAPARE - Administrator, Civil Service (Medical): No    Lack of Transportation (Non-Medical): No  Physical Activity: Not on file  Stress: Not on file  Social Connections: Not  on file  Intimate Partner Violence: Not on file    FAMILY HISTORY: No family history on file.  ALLERGIES:  has no known allergies.  MEDICATIONS:  . Allergies as of 04/29/2024   No Known Allergies      Medication List        Accurate as of Apr 29, 2024  8:42 AM. If you have any questions, ask your nurse or doctor.          Accu-Chek Guide test strip Generic drug: glucose blood Use to test blood sugars up to 4 times daily as directed.   Accu-Chek Guide w/Device Kit Use to test blood sugars up to 4 times daily as directed.   Accu-Chek  Softclix Lancets lancets Use to test blood sugars up to 4 times daily as directed.   acyclovir  400 MG tablet Commonly known as: ZOVIRAX  Take 1 tablet (400 mg total) by mouth 2 (two) times daily.   aspirin  EC 81 MG tablet Take 1 tablet (81 mg total) by mouth daily. Swallow whole.   B-12 1000 MCG Caps Take 1 tablet by mouth daily.   dapagliflozin  propanediol 10 MG Tabs tablet Commonly known as: Farxiga  Take 1 tablet (10 mg total) by mouth daily before breakfast. sa asar sa hroi   Dexcom G7 Receiver Devi 1 Device by Does not apply route continuous.   Dexcom G7 Sensor Misc 1 each by Does not apply route continuous. Change sensor every ten days.   gabapentin  300 MG capsule Commonly known as: NEURONTIN  Take 1 capsule (300 mg total) by mouth 3 (three) times daily.   lenalidomide  25 MG capsule Commonly known as: REVLIMID  TAKE 1 CAPSULE BY MOUTH 1 TIME A DAY FOR 21 DAYS ON THEN 7 DAYS OFF   losartan  25 MG tablet Commonly known as: COZAAR  Take 1 tablet (25 mg total) by mouth daily. Start taking if blood pressure is over 130/80.  Protects kidneys and lowers blood pressure.   metFORMIN  500 MG tablet Commonly known as: GLUCOPHAGE  Take 2 tablets (1,000 mg total) by mouth 2 (two) times daily with a meal. 2 asar 1 wot, 2 wot mnhum  amang 1 hroi   potassium chloride  SA 20 MEQ tablet Commonly known as: Klor-Con  M20 Take 1 tablet (20 mEq total) by mouth 2 (two) times daily.   rosuvastatin  10 MG tablet Commonly known as: CRESTOR  Take 1 tablet (10 mg total) by mouth daily.   tirzepatide  10 MG/0.5ML Pen Commonly known as: MOUNJARO  Inject 10 mg into the skin once a week.         REVIEW OF SYSTEMS:    10 Point review of Systems was done is negative except as noted above.   PHYSICAL EXAMINATION: Aaron AasAaron AasThere were no vitals taken for this visit.   GENERAL:alert, in no acute distress and comfortable SKIN: no acute rashes, no significant lesions EYES: conjunctiva are pink and  non-injected, sclera anicteric OROPHARYNX: MMM, no exudates, no oropharyngeal erythema or ulceration NECK: supple, no JVD LYMPH:  no palpable lymphadenopathy in the cervical, axillary or inguinal regions LUNGS: clear to auscultation b/l with normal respiratory effort HEART: regular rate & rhythm ABDOMEN:  normoactive bowel sounds , non tender, not distended. Extremity: no pedal edema PSYCH: alert & oriented x 3 with fluent speech NEURO: no focal motor/sensory deficits    LABORATORY DATA:  I have reviewed the data as listed .    Latest Ref Rng & Units 04/15/2024    7:42 AM 03/25/2024   10:18 AM 03/11/2024   11:44  AM  CBC  WBC 4.0 - 10.5 K/uL 5.0  5.9  6.1   Hemoglobin 13.0 - 17.0 g/dL 04.5  40.9  81.1   Hematocrit 39.0 - 52.0 % 41.4  43.2  45.9   Platelets 150 - 400 K/uL 201  144  193    .    Latest Ref Rng & Units 04/15/2024    7:42 AM 03/25/2024   10:18 AM 03/11/2024   11:44 AM  CMP  Glucose 70 - 99 mg/dL 914  782  956   BUN 6 - 20 mg/dL 12  14  15    Creatinine 0.61 - 1.24 mg/dL 2.13  0.86  5.78   Sodium 135 - 145 mmol/L 140  139  140   Potassium 3.5 - 5.1 mmol/L 3.3  3.4  3.2   Chloride 98 - 111 mmol/L 109  108  108   CO2 22 - 32 mmol/L 24  25  25    Calcium  8.9 - 10.3 mg/dL 8.3  8.8  8.6   Total Protein 6.5 - 8.1 g/dL 5.9  6.2  6.7   Total Bilirubin 0.0 - 1.2 mg/dL 0.5  0.8  0.8   Alkaline Phos 38 - 126 U/L 44  51  58   AST 15 - 41 U/L 16  13  16    ALT 0 - 44 U/L 15  11  35    . Lab Results  Component Value Date   LDH 114 10/10/2022      Bone marrow biopsy 02/11/2023   RADIOGRAPHIC STUDIES: I have personally reviewed the radiological images as listed and agreed with the findings in the report. No results found.  ASSESSMENT & PLAN:   58 y.o. 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  Patient diagnosed December 2021 and received 1 cycle of CyBorD. Had  previously presented with anemia renal insufficiency and hyperviscosity symptoms. Bx- 90% plasma cells Initial M spike 8.03g/dl ION(6E): Not Detected  Dup(1q): Not Detected  Gains(15): DETECTED  Gains(5 and 9): Not Detected  Del(13q)/-13: DETECTED  Del(17p)(TP53): Not Detected  IGH(Rearrangement): SEE BELOW   IgH complex: t(4;14): DETECTED  t(11;14): Not Detected  t(14;16): Not Detected  t(14;20): Not Detected   Cytogenetics: Normal male karyotype.   -Labs from 05/13/2022-beta-2  microglobulin 4.4, LDH 197, lambda free light chain 77.1, kappa, lambda light chain ratio 0.08, multiple myeloma panel pending.  UPEP ordered but not yet collected. 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  Due to multiple myeloma   PLAN  -Discussed lab results on 04/29/24 in detail with patient. CBC normal, showed WBC of 5.0K, hemoglobin of 14.5, and platelets of 143K. -myeloma panel from 2 weeks ago showed stable M protein of 0.3 g/dL -myeloma lab from today is pending -CMP shows that potassium is quite low at 3.0 mmol/L -discussed that some medications are causing his to lose potassium in the urine, including Jardiace and Revlimid  -instructed patient to take potassium replacement two tablets twice  daily for one week, then take one tablet twice daily after 1 week -patient has been tolerating Revlimid  and daratumumab  with no significant or new toxicity issues  -continue daratumumab  infusions every two  weeks -Continue monthly 25 mg Revlimid  po 3 weeks on 1 week off due to his myeloma having high-risk genetic mutation  -recommend wearing a cap for sun protection and staying well hydrated especially while outdoors.   Follow-up ***  The total time spent in the appointment was *** minutes* .  All of the patient's questions were answered with apparent satisfaction. The patient knows to call the clinic with any problems, questions or concerns.   Jacquelyn Matt MD MS AAHIVMS [facility] Hematology/Oncology Physician [facility]  .*Total Encounter Time as defined by the Centers for Medicare and Medicaid Services includes, in addition to the face-to-face time of a patient visit (documented in the note above) non-face-to-face time: obtaining and reviewing outside history, ordering and reviewing medications, tests or procedures, care coordination (communications with other health care professionals or caregivers) and documentation in the medical record.    I,Mitra Faeizi,acting as a Neurosurgeon for Jacquelyn Matt, MD.,have documented all relevant documentation on the behalf of Jacquelyn Matt, MD,as directed by  Jacquelyn Matt, MD while in the presence of Jacquelyn Matt, MD.  ***

## 2024-04-29 NOTE — Patient Instructions (Signed)
 [facility] - A DEPT OF [facility]  Discharge Instructions: Thank you for choosing [facility] to provide your oncology and hematology care.   If you have a lab appointment with the Cancer Center, please go directly to the Cancer Center and check in at the registration area.   Wear comfortable clothing and clothing appropriate for easy access to any Portacath or PICC line.   We strive to give you quality time with your provider. You may need to reschedule your appointment if you arrive late (15 or more minutes).  Arriving late affects you and other patients whose appointments are after yours.  Also, if you miss three or more appointments without notifying the office, you may be dismissed from the clinic at the provider's discretion.      For prescription refill requests, have your pharmacy contact our office and allow 72 hours for refills to be completed.    Today you received the following chemotherapy and/or immunotherapy agents: Daratumumab.       To help prevent nausea and vomiting after your treatment, we encourage you to take your nausea medication as directed.  BELOW ARE SYMPTOMS THAT SHOULD BE REPORTED IMMEDIATELY: *FEVER GREATER THAN 100.4 F (38 C) OR HIGHER *CHILLS OR SWEATING *NAUSEA AND VOMITING THAT IS NOT CONTROLLED WITH YOUR NAUSEA MEDICATION *UNUSUAL SHORTNESS OF BREATH *UNUSUAL BRUISING OR BLEEDING *URINARY PROBLEMS (pain or burning when urinating, or frequent urination) *BOWEL PROBLEMS (unusual diarrhea, constipation, pain near the anus) TENDERNESS IN MOUTH AND THROAT WITH OR WITHOUT PRESENCE OF ULCERS (sore throat, sores in mouth, or a toothache) UNUSUAL RASH, SWELLING OR PAIN  UNUSUAL VAGINAL DISCHARGE OR ITCHING   Items with * indicate a potential emergency and should be followed up as soon as possible or go to the Emergency Department if any problems should occur.  Please show the CHEMOTHERAPY ALERT CARD or  IMMUNOTHERAPY ALERT CARD at check-in to the Emergency Department and triage nurse.  Should you have questions after your visit or need to cancel or reschedule your appointment, please contact [facility] - A DEPT OF [facility]  Dept: [phone]  and follow the prompts.  Office hours are 8:00 a.m. to 4:30 p.m. Monday - Friday. Please note that voicemails left after 4:00 p.m. may not be returned until the following business day.  We are closed weekends and major holidays. You have access to a nurse at all times for urgent questions. Please call the main number to the clinic Dept: [phone] and follow the prompts.   For any non-urgent questions, you may also contact your provider using MyChart. We now offer e-Visits for anyone 55 and older to request care online for non-urgent symptoms. For details visit [email or web address].   Also download the MyChart app! Go to the app store, search "MyChart", open the app, select , and log in with your MyChart username and password.  Zoledronic Acid Injection (Cancer) What is this medication? ZOLEDRONIC ACID (ZOE le dron ik AS id) treats high calcium levels in the blood caused by cancer. It may also be used with chemotherapy to treat weakened bones caused by cancer. It works by slowing down the release of calcium from bones. This lowers calcium levels in your blood. It also makes your bones stronger and less likely to break (fracture). It belongs to a group of medications called bisphosphonates. This medicine may be used for other purposes; ask your health care  provider or pharmacist if you have questions. COMMON BRAND NAME(S): Zometa, Zometa Powder What should I tell my care team before I take this medication? They need to know if you have any of these conditions: Dehydration Dental disease Kidney disease Liver disease Low levels of calcium in the blood Lung or breathing disease, such as asthma Receiving  steroids, such as dexamethasone or prednisone An unusual or allergic reaction to zoledronic acid, other medications, foods, dyes, or preservatives Pregnant or trying to get pregnant Breast-feeding How should I use this medication? This medication is injected into a vein. It is given by your care team in a hospital or clinic setting. Talk to your care team about the use of this medication in children. Special care may be needed. Overdosage: If you think you have taken too much of this medicine contact a poison control center or emergency room at once. NOTE: This medicine is only for you. Do not share this medicine with others. What if I miss a dose? Keep appointments for follow-up doses. It is important not to miss your dose. Call your care team if you are unable to keep an appointment. What may interact with this medication? Certain antibiotics given by injection Diuretics, such as bumetanide, furosemide NSAIDs, medications for pain and inflammation, such as ibuprofen or naproxen Teriparatide Thalidomide This list may not describe all possible interactions. Give your health care provider a list of all the medicines, herbs, non-prescription drugs, or dietary supplements you use. Also tell them if you smoke, drink alcohol, or use illegal drugs. Some items may interact with your medicine. What should I watch for while using this medication? Visit your care team for regular checks on your progress. It may be some time before you see the benefit from this medication. Some people who take this medication have severe bone, joint, or muscle pain. This medication may also increase your risk for jaw problems or a broken thigh bone. Tell your care team right away if you have severe pain in your jaw, bones, joints, or muscles. Tell you care team if you have any pain that does not go away or that gets worse. Tell your dentist and dental surgeon that you are taking this medication. You should not have major  dental surgery while on this medication. See your dentist to have a dental exam and fix any dental problems before starting this medication. Take good care of your teeth while on this medication. Make sure you see your dentist for regular follow-up appointments. You should make sure you get enough calcium and vitamin D while you are taking this medication. Discuss the foods you eat and the vitamins you take with your care team. Check with your care team if you have severe diarrhea, nausea, and vomiting, or if you sweat a lot. The loss of too much body fluid may make it dangerous for you to take this medication. You may need bloodwork while taking this medication. Talk to your care team if you wish to become pregnant or think you might be pregnant. This medication can cause serious birth defects. What side effects may I notice from receiving this medication? Side effects that you should report to your care team as soon as possible: Allergic reactions--skin rash, itching, hives, swelling of the face, lips, tongue, or throat Kidney injury--decrease in the amount of urine, swelling of the ankles, hands, or feet Low calcium level--muscle pain or cramps, confusion, tingling, or numbness in the hands or feet Osteonecrosis of the jaw--pain, swelling, or  redness in the mouth, numbness of the jaw, poor healing after dental work, unusual discharge from the mouth, visible bones in the mouth Severe bone, joint, or muscle pain Side effects that usually do not require medical attention (report to your care team if they continue or are bothersome): Constipation Fatigue Fever Loss of appetite Nausea Stomach pain This list may not describe all possible side effects. Call your doctor for medical advice about side effects. You may report side effects to FDA at 1-800-FDA-1088. Where should I keep my medication? This medication is given in a hospital or clinic. It will not be stored at home. NOTE: This sheet is a  summary. It may not cover all possible information. If you have questions about this medicine, talk to your doctor, pharmacist, or health care provider.  2024 [employer] Standard (2022-02-07 00:00:00)

## 2024-04-29 NOTE — Progress Notes (Signed)
 Ok to proceed with Zometa  with Ca=8.4 per Dr. Salomon Cree.   Yeimy Brabant, PharmD, MBA

## 2024-05-02 ENCOUNTER — Other Ambulatory Visit (HOSPITAL_COMMUNITY): Payer: Self-pay

## 2024-05-02 ENCOUNTER — Telehealth: Payer: Self-pay | Admitting: Pharmacy Technician

## 2024-05-02 LAB — MULTIPLE MYELOMA PANEL, SERUM
Albumin SerPl Elph-Mcnc: 3.4 g/dL (ref 2.9–4.4)
Albumin/Glob SerPl: 1.6 (ref 0.7–1.7)
Alpha 1: 0.1 g/dL (ref 0.0–0.4)
Alpha2 Glob SerPl Elph-Mcnc: 0.7 g/dL (ref 0.4–1.0)
B-Globulin SerPl Elph-Mcnc: 0.8 g/dL (ref 0.7–1.3)
Gamma Glob SerPl Elph-Mcnc: 0.6 g/dL (ref 0.4–1.8)
Globulin, Total: 2.2 g/dL (ref 2.2–3.9)
IgA: 142 mg/dL (ref 90–386)
IgG (Immunoglobin G), Serum: 659 mg/dL (ref 603–1613)
IgM (Immunoglobulin M), Srm: 19 mg/dL — ABNORMAL LOW (ref 20–172)
M Protein SerPl Elph-Mcnc: 0.3 g/dL — ABNORMAL HIGH
Total Protein ELP: 5.6 g/dL — ABNORMAL LOW (ref 6.0–8.5)

## 2024-05-02 NOTE — Telephone Encounter (Signed)
 Oral Oncology Patient Advocate Encounter   Received notification that prior authorization for Revlimid  is due for renewal.   PA submitted on 05/02/24 Key [id number] Status is pending     Roda Cirri, CPhT Specialty Pharmacy Patient Advocate Phone: [phone] Fax: [phone]

## 2024-05-02 NOTE — Telephone Encounter (Signed)
 Oral Oncology Patient Advocate Encounter  Prior Authorization for REvlimid  has been approved.    PA# PA Case ID #: [id number] Effective dates: 05/02/24 through 05/02/25  Patient must fill through [facility]. Informed the nurse that the PA had been approved, so they can go forward with shipping.    Roda Cirri, CPhT Specialty Pharmacy Patient Advocate Phone: [phone] Fax: [phone]

## 2024-05-02 NOTE — Telephone Encounter (Signed)
 Called [facility] to inform them that we got the PA re-approved. She was able to process the script and will have it out to the pt Wednesday as initially discussed.

## 2024-05-03 ENCOUNTER — Other Ambulatory Visit: Payer: Self-pay | Admitting: Hematology

## 2024-05-03 ENCOUNTER — Encounter: Payer: Self-pay | Admitting: Internal Medicine

## 2024-05-03 ENCOUNTER — Ambulatory Visit: Admitting: Internal Medicine

## 2024-05-03 VITALS — BP 118/70 | HR 73 | Temp 97.7°F | Ht 66.0 in | Wt 167.6 lb

## 2024-05-03 DIAGNOSIS — R809 Proteinuria, unspecified: Secondary | ICD-10-CM | POA: Diagnosis not present

## 2024-05-03 DIAGNOSIS — E1129 Type 2 diabetes mellitus with other diabetic kidney complication: Secondary | ICD-10-CM

## 2024-05-03 DIAGNOSIS — E876 Hypokalemia: Secondary | ICD-10-CM

## 2024-05-03 DIAGNOSIS — Z603 Acculturation difficulty: Secondary | ICD-10-CM

## 2024-05-03 DIAGNOSIS — Z7984 Long term (current) use of oral hypoglycemic drugs: Secondary | ICD-10-CM | POA: Diagnosis not present

## 2024-05-03 NOTE — Assessment & Plan Note (Signed)
 Brief visit with continuous glucose monitor training only, due to planto review data was not able to be done. I coached on proper application scanning and usage of the libre device.

## 2024-05-03 NOTE — Patient Instructions (Signed)
 It was a pleasure seeing you today! Your health and satisfaction are our top priorities.  Donald Curt, MD  Your Providers PCP: Anthon Kins, MD,  [phone]) Referring Provider: Anthon Kins, MD,  [phone]) Care Team Provider: Frankie Israel, MD,  [phone])     NEXT STEPS: [x]  Early Intervention: Schedule sooner appointment, call our on-call services, or go to emergency room if there is any significant Increase in pain or discomfort New or worsening symptoms Sudden or severe changes in your health [x]  Flexible Follow-Up: We recommend a No follow-ups on file. for optimal routine care. This allows for progress monitoring and treatment adjustments. [x]  Preventive Care: Schedule your annual preventive care visit! It's typically covered by insurance and helps identify potential health issues early. [x]  Lab & X-ray Appointments: Incomplete tests scheduled today, or call to schedule. X-rays: [facility] (M-F, 8:30am-noon or 1pm-5pm). [x]  Medical Information Release: Sign a release form at front desk to obtain relevant medical information we don't have.  MAKING THE MOST OF OUR FOCUSED 20 MINUTE APPOINTMENTS: [x]   Clearly state your top concerns at the beginning of the visit to focus our discussion [x]   If you anticipate you will need more time, please inform the front desk during scheduling - we can book multiple appointments in the same week. [x]   If you have transportation problems- use our convenient video appointments or ask about transportation support. [x]   We can get down to business faster if you use MyChart to update information before the visit and submit non-urgent questions before your visit. Thank you for taking the time to provide details through MyChart.  Let our nurse know and she can import this information into your encounter documents.  Arrival and Wait Times: [x]   Arriving on time ensures that everyone receives prompt  attention. [x]   Early morning (8a) and afternoon (1p) appointments tend to have shortest wait times. [x]   Unfortunately, we cannot delay appointments for late arrivals or hold slots during phone calls.  Getting Answers and Following Up [x]   Simple Questions & Concerns: For quick questions or basic follow-up after your visit, reach us  at [phone] or MyChart messaging. [x]   Complex Concerns: If your concern is more complex, scheduling an appointment might be best. Discuss this with the staff to find the most suitable option. [x]   Lab & Imaging Results: We'll contact you directly if results are abnormal or you don't use MyChart. Most normal results will be on MyChart within 2-3 business days, with a review message from Dr. Boston Byers. Haven't heard back in 2 weeks? Need results sooner? Contact us  at [phone]. [x]   Referrals: Our referral coordinator will manage specialist referrals. The specialist's office should contact you within 2 weeks to schedule an appointment. Call us  if you haven't heard from them after 2 weeks.  Staying Connected [x]   MyChart: Activate your MyChart for the fastest way to access results and message us . See the last page of this paperwork for instructions on how to activate.  Bring to Your Next Appointment [x]   Medications: Please bring all your medication bottles to your next appointment to ensure we have an accurate record of your prescriptions. [x]   Health Diaries: If you're monitoring any health conditions at home, keeping a diary of your readings can be very helpful for discussions at your next appointment.  Billing [x]   X-ray & Lab Orders: These are billed by separate companies. Contact the invoicing company directly for questions or concerns. [x]   Visit  Charges: Discuss any billing inquiries with our administrative services team.  Your Satisfaction Matters [x]   Share Your Experience: We strive for your satisfaction! If you have any complaints, or preferably  compliments, please let Dr. Boston Byers know directly or contact our Practice Administrators, Olinda Bertrand or [employer], by asking at the front desk.   Reviewing Your Records [x]   Review this early draft of your clinical encounter notes below and the final encounter summary tomorrow on MyChart after its been completed.  All orders placed so far are visible here: There are no diagnoses linked to this encounter.

## 2024-05-03 NOTE — Progress Notes (Signed)
 ==============================  [facility]: [phone]   -- Medical Office Visit --  Patient: Donald Suarez      Age: 58 y.o.       Sex:  male  Date:   05/03/2024 Today's Healthcare Provider: Anthon Kins, MD  ==============================   Chief Complaint: Diabetes  History of Present Illness 58 year old male with multiple myeloma and diabetes who presents for a follow-up visit.  This is his fifth office visit this year, indicating more frequent monitoring compared to the previous year. His multiple myeloma is currently in remission with no signs of recurrence. He has been on medication for this condition, which previously affected his blood sugar control.  Regarding his diabetes, his hemoglobin A1c was at its best level in March, suggesting improved glycemic control. He has been taking Farxiga  10 mg, metformin  (two tablets in the morning and two in the evening), and Mounjaro  10 mg once a week. He feels well and has been managing his diet by reducing rice intake.  Since last visit, he uses a glucose meter to monitor his blood sugar levels, but the sensor recently fell off during a shower, preventing him from obtaining recent glucose readings. A new sensor was applied during the visit, and it is expected to start providing data in 60 minutes.  Unfortunately none of the original sensor data was available for review as had been planned for the visit  He is also taking gabapentin , which was prescribed by another doctor, Dr. Salomon Cree- for Revlimid  related neuropathy. His blood pressure is reported to be good, and his kidney function is described as being in great shape.  No new symptoms reported. He feels well and has no current health complaints.  Background Reviewed: Problem List: has Multiple myeloma in remission (HCC); Counseling regarding advance care planning and goals of care; Hypoalbuminemia; Hyperviscosity; Type 2 diabetes mellitus with  hyperglycemia (HCC); Chronic apical periodontitis; Hyperproteinemia; Blurry vision; Hypomagnesemia; Elevated ferritin; Microalbuminuria; Hypocalcemia; Peripheral neuropathy; Language barrier affecting health care; Hypokalemia; History of dental problems; At high risk for venous thromboembolism; Diabetes mellitus with proteinuria (HCC); Hypertension; and Chronic kidney disease on their problem list. Past Medical History:  has a past medical history of Abfraction (08/06/2022), Accretions on teeth (08/06/2022), Anemia, Attrition, teeth excessive (08/06/2022), Caries (08/06/2022), Defective dental restoration (08/06/2022), Elevated ferritin (08/07/2022), Hypocalcemia (08/19/2022), Loose, teeth (08/06/2022), Malocclusion (08/06/2022), Microalbuminuria (08/19/2022), Periodontal disease (08/06/2022), Peripheral neuropathy (09/08/2022), and Teeth missing (08/06/2022). Past Surgical History:   has a past surgical history that includes Appendectomy; [place] US  Guide Vasc Access Right (05/15/2022); [place] Fluoro Guide CV Line Right (05/15/2022); [place] PATIENT EVAL TECH 0-60 MINS (05/19/2022); and [place] BONE MARROW BIOPSY & ASPIRATION (02/11/2023). Social History:   reports that he has never smoked. He has never used smokeless tobacco. He reports that he does not drink alcohol and does not use drugs. Family History:  family history is not on file. Allergies:  has no known allergies.   Medication Reconciliation: Current Outpatient Medications on File Prior to Visit  Medication Sig   Accu-Chek Softclix Lancets lancets Use to test blood sugars up to 4 times daily as directed.   acyclovir  (ZOVIRAX ) 400 MG tablet Take 1 tablet (400 mg total) by mouth 2 (two) times daily.   aspirin  EC 81 MG tablet Take 1 tablet (81 mg total) by mouth daily. Swallow whole.   Blood Glucose Monitoring Suppl (ACCU-CHEK GUIDE) w/Device KIT Use to test blood sugars up to 4 times daily as directed.  Continuous Glucose Receiver (DEXCOM G7 RECEIVER) DEVI 1 Device  by Does not apply route continuous.   Continuous Glucose Sensor (DEXCOM G7 SENSOR) MISC 1 each by Does not apply route continuous. Change sensor every ten days.   Cyanocobalamin  (B-12) 1000 MCG CAPS Take 1 tablet by mouth daily.   dapagliflozin  propanediol (FARXIGA ) 10 MG TABS tablet Take 1 tablet (10 mg total) by mouth daily before breakfast. sa asar sa hroi   gabapentin  (NEURONTIN ) 300 MG capsule Take 1 capsule (300 mg total) by mouth 3 (three) times daily.   glucose blood (ACCU-CHEK GUIDE) test strip Use to test blood sugars up to 4 times daily as directed.   lenalidomide  (REVLIMID ) 25 MG capsule TAKE 1 CAPSULE BY MOUTH 1 TIME A DAY FOR 21 DAYS ON THEN 7 DAYS OFF   losartan  (COZAAR ) 25 MG tablet Take 1 tablet (25 mg total) by mouth daily. Start taking if blood pressure is over 130/80.  Protects kidneys and lowers blood pressure.   metFORMIN  (GLUCOPHAGE ) 500 MG tablet Take 2 tablets (1,000 mg total) by mouth 2 (two) times daily with a meal. 2 asar 1 wot, 2 wot mnhum  amang 1 hroi   potassium chloride  SA (KLOR-CON  M20) 20 MEQ tablet Take 1 tablet (20 mEq total) by mouth 2 (two) times daily.   rosuvastatin  (CRESTOR ) 10 MG tablet Take 1 tablet (10 mg total) by mouth daily.   tirzepatide  (MOUNJARO ) 10 MG/0.5ML Pen Inject 10 mg into the skin once a week.   Current Facility-Administered Medications on File Prior to Visit  Medication   clotrimazole  (LOTRIMIN ) 1 % cream  There are no discontinued medications.   Physical Exam:    05/03/2024    7:50 AM 04/29/2024   11:58 AM 04/15/2024   11:20 AM  Vitals with BMI  Height 5\' 6"  5\' 6"    Weight 167 lbs 10 oz 168 lbs 14 oz   BMI 27.06 27.27   Systolic 118 129 960  Diastolic 70 80 89  Pulse 73 67 66  Vital signs reviewed.  Nursing notes reviewed. Weight trend reviewed. Physical Exam General Appearance:  No acute distress appreciable.   Well-groomed, healthy-appearing male.  Well proportioned with no abnormal fat distribution.  Good muscle  tone. Pulmonary:  Normal work of breathing at rest, no respiratory distress apparent. SpO2: 98 %  Musculoskeletal: All extremities are intact.  Neurological:  Awake, alert, oriented, and engaged.  No obvious focal neurological deficits or cognitive impairments.  Sensorium seems unclouded.   Speech is clear and coherent with logical content. Psychiatric:  Appropriate mood, pleasant and cooperative demeanor, thoughtful and engaged during the exam Physical Exam Language barrier but some english, translator helps    No results found for any visits on 05/03/24. Appointment on 04/29/2024  Component Date Value   WBC Count 04/29/2024 5.0    RBC 04/29/2024 5.36    Hemoglobin 04/29/2024 14.5    HCT 04/29/2024 42.2    MCV 04/29/2024 78.7 (L)    MCH 04/29/2024 27.1    MCHC 04/29/2024 34.4    RDW 04/29/2024 15.1    Platelet Count 04/29/2024 143 (L)    nRBC 04/29/2024 0.0    Neutrophils Relative % 04/29/2024 40    Neutro Abs 04/29/2024 2.0    Lymphocytes Relative 04/29/2024 37    Lymphs Abs 04/29/2024 1.9    Monocytes Relative 04/29/2024 14    Monocytes Absolute 04/29/2024 0.7    Eosinophils Relative 04/29/2024 7    Eosinophils Absolute 04/29/2024 0.3  Basophils Relative 04/29/2024 2    Basophils Absolute 04/29/2024 0.1    Immature Granulocytes 04/29/2024 0    Abs Immature Granulocytes 04/29/2024 0.01    Sodium 04/29/2024 141    Potassium 04/29/2024 3.0 (L)    Chloride 04/29/2024 109    CO2 04/29/2024 27    Glucose, Bld 04/29/2024 139 (H)    BUN 04/29/2024 13    Creatinine, Ser 04/29/2024 0.72    Calcium  04/29/2024 8.4 (L)    Total Protein 04/29/2024 6.0 (L)    Albumin  04/29/2024 4.0    AST 04/29/2024 18    ALT 04/29/2024 27    Alkaline Phosphatase 04/29/2024 51    Total Bilirubin 04/29/2024 0.6    GFR, Estimated 04/29/2024 >60    Anion gap 04/29/2024 5    IgG (Immunoglobin G), Se* 04/29/2024 659    IgA 04/29/2024 142    IgM (Immunoglobulin M), * 04/29/2024 19 (L)    Total  Protein ELP 04/29/2024 5.6 (L)    Albumin  SerPl Elph-Mcnc 04/29/2024 3.4    Alpha 1 04/29/2024 0.1    Alpha2 Glob SerPl Elph-M* 04/29/2024 0.7    B-Globulin SerPl Elph-Mc* 04/29/2024 0.8    Gamma Glob SerPl Elph-Mc* 04/29/2024 0.6    M Protein SerPl Elph-Mcnc 04/29/2024 0.3 (H)    Globulin, Total 04/29/2024 2.2    Albumin /Glob SerPl 04/29/2024 1.6    IFE 1 04/29/2024 Comment (A)    Please Note 04/29/2024 Comment   Appointment on 04/15/2024  Component Date Value   IgG (Immunoglobin G), Se* 04/15/2024 647    IgA 04/15/2024 137    IgM (Immunoglobulin M), * 04/15/2024 20    Total Protein ELP 04/15/2024 5.6 (L)    Albumin  SerPl Elph-Mcnc 04/15/2024 3.4    Alpha 1 04/15/2024 0.1    Alpha2 Glob SerPl Elph-M* 04/15/2024 0.7    B-Globulin SerPl Elph-Mc* 04/15/2024 0.7    Gamma Glob SerPl Elph-Mc* 04/15/2024 0.6    M Protein SerPl Elph-Mcnc 04/15/2024 0.3 (H)    Globulin, Total 04/15/2024 2.2    Albumin /Glob SerPl 04/15/2024 1.6    IFE 1 04/15/2024 Comment (A)    Please Note 04/15/2024 Comment    Sodium 04/15/2024 140    Potassium 04/15/2024 3.3 (L)    Chloride 04/15/2024 109    CO2 04/15/2024 24    Glucose, Bld 04/15/2024 169 (H)    BUN 04/15/2024 12    Creatinine, Ser 04/15/2024 0.69    Calcium  04/15/2024 8.3 (L)    Total Protein 04/15/2024 5.9 (L)    Albumin  04/15/2024 3.9    AST 04/15/2024 16    ALT 04/15/2024 15    Alkaline Phosphatase 04/15/2024 44    Total Bilirubin 04/15/2024 0.5    GFR, Estimated 04/15/2024 >60    Anion gap 04/15/2024 7    WBC Count 04/15/2024 5.0    RBC 04/15/2024 5.25    Hemoglobin 04/15/2024 14.1    HCT 04/15/2024 41.4    MCV 04/15/2024 78.9 (L)    MCH 04/15/2024 26.9    MCHC 04/15/2024 34.1    RDW 04/15/2024 14.9    Platelet Count 04/15/2024 201    nRBC 04/15/2024 0.0    Neutrophils Relative % 04/15/2024 35    Neutro Abs 04/15/2024 1.8    Lymphocytes Relative 04/15/2024 47    Lymphs Abs 04/15/2024 2.3    Monocytes Relative 04/15/2024 11     Monocytes Absolute 04/15/2024 0.6    Eosinophils Relative 04/15/2024 6    Eosinophils Absolute 04/15/2024 0.3  Basophils Relative 04/15/2024 1    Basophils Absolute 04/15/2024 0.1    Immature Granulocytes 04/15/2024 0    Abs Immature Granulocytes 04/15/2024 0.01   Appointment on 03/25/2024  Component Date Value   WBC Count 03/25/2024 5.9    RBC 03/25/2024 5.34    Hemoglobin 03/25/2024 14.3    HCT 03/25/2024 43.2    MCV 03/25/2024 80.9    MCH 03/25/2024 26.8    MCHC 03/25/2024 33.1    RDW 03/25/2024 15.5    Platelet Count 03/25/2024 144 (L)    nRBC 03/25/2024 0.0    Neutrophils Relative % 03/25/2024 49    Neutro Abs 03/25/2024 2.9    Lymphocytes Relative 03/25/2024 32    Lymphs Abs 03/25/2024 1.9    Monocytes Relative 03/25/2024 7    Monocytes Absolute 03/25/2024 0.4    Eosinophils Relative 03/25/2024 10    Eosinophils Absolute 03/25/2024 0.6 (H)    Basophils Relative 03/25/2024 2    Basophils Absolute 03/25/2024 0.1    Immature Granulocytes 03/25/2024 0    Abs Immature Granulocytes 03/25/2024 0.01    Sodium 03/25/2024 139    Potassium 03/25/2024 3.4 (L)    Chloride 03/25/2024 108    CO2 03/25/2024 25    Glucose, Bld 03/25/2024 172 (H)    BUN 03/25/2024 14    Creatinine, Ser 03/25/2024 0.77    Calcium  03/25/2024 8.8 (L)    Total Protein 03/25/2024 6.2 (L)    Albumin  03/25/2024 4.0    AST 03/25/2024 13 (L)    ALT 03/25/2024 11    Alkaline Phosphatase 03/25/2024 51    Total Bilirubin 03/25/2024 0.8    GFR, Estimated 03/25/2024 >60    Anion gap 03/25/2024 6    IgG (Immunoglobin G), Se* 03/25/2024 742    IgA 03/25/2024 137    IgM (Immunoglobulin M), * 03/25/2024 21    Total Protein ELP 03/25/2024 6.0    Albumin  SerPl Elph-Mcnc 03/25/2024 3.4    Alpha 1 03/25/2024 0.2    Alpha2 Glob SerPl Elph-M* 03/25/2024 0.9    B-Globulin SerPl Elph-Mc* 03/25/2024 0.9    Gamma Glob SerPl Elph-Mc* 03/25/2024 0.7    M Protein SerPl Elph-Mcnc 03/25/2024 0.3 (H)    Globulin, Total  03/25/2024 2.6    Albumin /Glob SerPl 03/25/2024 1.4    IFE 1 03/25/2024 Comment (A)    Please Note 03/25/2024 Comment   Office Visit on 03/21/2024  Component Date Value   Hemoglobin A1C 03/21/2024 7.0 (A)   Appointment on 03/11/2024  Component Date Value   WBC Count 03/11/2024 6.1    RBC 03/11/2024 5.74    Hemoglobin 03/11/2024 15.3    HCT 03/11/2024 45.9    MCV 03/11/2024 80.0    MCH 03/11/2024 26.7    MCHC 03/11/2024 33.3    RDW 03/11/2024 15.9 (H)    Platelet Count 03/11/2024 193    nRBC 03/11/2024 0.0    Neutrophils Relative % 03/11/2024 38    Neutro Abs 03/11/2024 2.3    Lymphocytes Relative 03/11/2024 43    Lymphs Abs 03/11/2024 2.6    Monocytes Relative 03/11/2024 11    Monocytes Absolute 03/11/2024 0.7    Eosinophils Relative 03/11/2024 6    Eosinophils Absolute 03/11/2024 0.3    Basophils Relative 03/11/2024 2    Basophils Absolute 03/11/2024 0.1    Immature Granulocytes 03/11/2024 0    Abs Immature Granulocytes 03/11/2024 0.02    Sodium 03/11/2024 140    Potassium 03/11/2024 3.2 (L)    Chloride 03/11/2024 108  CO2 03/11/2024 25    Glucose, Bld 03/11/2024 205 (H)    BUN 03/11/2024 15    Creatinine, Ser 03/11/2024 0.70    Calcium  03/11/2024 8.6 (L)    Total Protein 03/11/2024 6.7    Albumin  03/11/2024 4.2    AST 03/11/2024 16    ALT 03/11/2024 35    Alkaline Phosphatase 03/11/2024 58    Total Bilirubin 03/11/2024 0.8    GFR, Estimated 03/11/2024 >60    Anion gap 03/11/2024 7    IgG (Immunoglobin G), Se* 03/11/2024 729    IgA 03/11/2024 155    IgM (Immunoglobulin M), * 03/11/2024 26    Total Protein ELP 03/11/2024 6.4    Albumin  SerPl Elph-Mcnc 03/11/2024 3.6    Alpha 1 03/11/2024 0.2    Alpha2 Glob SerPl Elph-M* 03/11/2024 0.9    B-Globulin SerPl Elph-Mc* 03/11/2024 1.0    Gamma Glob SerPl Elph-Mc* 03/11/2024 0.7    M Protein SerPl Elph-Mcnc 03/11/2024 0.3 (H)    Globulin, Total 03/11/2024 2.8    Albumin /Glob SerPl 03/11/2024 1.3    IFE 1  03/11/2024 Comment (A)    Please Note 03/11/2024 Comment   Appointment on 02/26/2024  Component Date Value   WBC Count 02/26/2024 6.5    RBC 02/26/2024 5.60    Hemoglobin 02/26/2024 14.9    HCT 02/26/2024 44.8    MCV 02/26/2024 80.0    MCH 02/26/2024 26.6    MCHC 02/26/2024 33.3    RDW 02/26/2024 16.1 (H)    Platelet Count 02/26/2024 184    nRBC 02/26/2024 0.0    Neutrophils Relative % 02/26/2024 47    Neutro Abs 02/26/2024 3.0    Lymphocytes Relative 02/26/2024 38    Lymphs Abs 02/26/2024 2.5    Monocytes Relative 02/26/2024 8    Monocytes Absolute 02/26/2024 0.5    Eosinophils Relative 02/26/2024 6    Eosinophils Absolute 02/26/2024 0.4    Basophils Relative 02/26/2024 1    Basophils Absolute 02/26/2024 0.1    Immature Granulocytes 02/26/2024 0    Abs Immature Granulocytes 02/26/2024 0.02    Sodium 02/26/2024 137    Potassium 02/26/2024 3.4 (L)    Chloride 02/26/2024 105    CO2 02/26/2024 26    Glucose, Bld 02/26/2024 207 (H)    BUN 02/26/2024 9    Creatinine, Ser 02/26/2024 0.69    Calcium  02/26/2024 8.9    Total Protein 02/26/2024 6.3 (L)    Albumin  02/26/2024 4.0    AST 02/26/2024 13 (L)    ALT 02/26/2024 21    Alkaline Phosphatase 02/26/2024 52    Total Bilirubin 02/26/2024 0.8    GFR, Estimated 02/26/2024 >60    Anion gap 02/26/2024 6    IgG (Immunoglobin G), Se* 02/26/2024 728    IgA 02/26/2024 117    IgM (Immunoglobulin M), * 02/26/2024 29    Total Protein ELP 02/26/2024 5.9 (L)    Albumin  SerPl Elph-Mcnc 02/26/2024 3.5    Alpha 1 02/26/2024 0.3    Alpha2 Glob SerPl Elph-M* 02/26/2024 0.6    B-Globulin SerPl Elph-Mc* 02/26/2024 0.9    Gamma Glob SerPl Elph-Mc* 02/26/2024 0.6    M Protein SerPl Elph-Mcnc 02/26/2024 0.3 (H)    Globulin, Total 02/26/2024 2.4    Albumin /Glob SerPl 02/26/2024 1.5    IFE 1 02/26/2024 Comment (A)    Please Note 02/26/2024 Comment   Appointment on 02/12/2024  Component Date Value   WBC Count 02/12/2024 7.0    RBC 02/12/2024  5.81    Hemoglobin  02/12/2024 15.3    HCT 02/12/2024 45.6    MCV 02/12/2024 78.5 (L)    MCH 02/12/2024 26.3    MCHC 02/12/2024 33.6    RDW 02/12/2024 15.4    Platelet Count 02/12/2024 126 (L)    nRBC 02/12/2024 0.0    Neutrophils Relative % 02/12/2024 41    Neutro Abs 02/12/2024 2.8    Lymphocytes Relative 02/12/2024 42    Lymphs Abs 02/12/2024 3.0    Monocytes Relative 02/12/2024 8    Monocytes Absolute 02/12/2024 0.5    Eosinophils Relative 02/12/2024 8    Eosinophils Absolute 02/12/2024 0.5    Basophils Relative 02/12/2024 1    Basophils Absolute 02/12/2024 0.1    Immature Granulocytes 02/12/2024 0    Abs Immature Granulocytes 02/12/2024 0.02    IgG (Immunoglobin G), Se* 02/12/2024 800    IgA 02/12/2024 108    IgM (Immunoglobulin M), * 02/12/2024 25    Total Protein ELP 02/12/2024 6.2    Albumin  SerPl Elph-Mcnc 02/12/2024 3.8    Alpha 1 02/12/2024 0.2    Alpha2 Glob SerPl Elph-M* 02/12/2024 0.8    B-Globulin SerPl Elph-Mc* 02/12/2024 0.8    Gamma Glob SerPl Elph-Mc* 02/12/2024 0.7    M Protein SerPl Elph-Mcnc 02/12/2024 0.3 (H)    Globulin, Total 02/12/2024 2.4    Albumin /Glob SerPl 02/12/2024 1.6    IFE 1 02/12/2024 Comment (A)    Please Note 02/12/2024 Comment    Sodium 02/12/2024 138    Potassium 02/12/2024 2.9 (L)    Chloride 02/12/2024 102    CO2 02/12/2024 28    Glucose, Bld 02/12/2024 211 (H)    BUN 02/12/2024 11    Creatinine 02/12/2024 0.85    Calcium  02/12/2024 9.1    Total Protein 02/12/2024 6.6    Albumin  02/12/2024 4.2    AST 02/12/2024 11 (L)    ALT 02/12/2024 12    Alkaline Phosphatase 02/12/2024 50    Total Bilirubin 02/12/2024 0.7    GFR, Estimated 02/12/2024 >60    Anion gap 02/12/2024 8   Appointment on 01/15/2024  Component Date Value   WBC Count 01/15/2024 11.5 (H)    RBC 01/15/2024 5.67    Hemoglobin 01/15/2024 15.0    HCT 01/15/2024 45.3    MCV 01/15/2024 79.9 (L)    MCH 01/15/2024 26.5    MCHC 01/15/2024 33.1    RDW 01/15/2024  16.2 (H)    Platelet Count 01/15/2024 172    nRBC 01/15/2024 0.0    Neutrophils Relative % 01/15/2024 54    Neutro Abs 01/15/2024 6.3    Lymphocytes Relative 01/15/2024 31    Lymphs Abs 01/15/2024 3.5    Monocytes Relative 01/15/2024 13    Monocytes Absolute 01/15/2024 1.5 (H)    Eosinophils Relative 01/15/2024 1    Eosinophils Absolute 01/15/2024 0.1    Basophils Relative 01/15/2024 1    Basophils Absolute 01/15/2024 0.1    Immature Granulocytes 01/15/2024 0    Abs Immature Granulocytes 01/15/2024 0.04    Sodium 01/15/2024 137    Potassium 01/15/2024 3.4 (L)    Chloride 01/15/2024 104    CO2 01/15/2024 24    Glucose, Bld 01/15/2024 191 (H)    BUN 01/15/2024 13    Creatinine, Ser 01/15/2024 0.79    Calcium  01/15/2024 9.5    Total Protein 01/15/2024 7.1    Albumin  01/15/2024 4.1    AST 01/15/2024 13 (L)    ALT 01/15/2024 17    Alkaline Phosphatase 01/15/2024 70    Total  Bilirubin 01/15/2024 0.6    GFR, Estimated 01/15/2024 >60    Anion gap 01/15/2024 9    IgG (Immunoglobin G), Se* 01/15/2024 784    IgA 01/15/2024 119    IgM (Immunoglobulin M), * 01/15/2024 25    Total Protein ELP 01/15/2024 6.5    Albumin  SerPl Elph-Mcnc 01/15/2024 3.3    Alpha 1 01/15/2024 0.3    Alpha2 Glob SerPl Elph-M* 01/15/2024 1.2 (H)    B-Globulin SerPl Elph-Mc* 01/15/2024 1.0    Gamma Glob SerPl Elph-Mc* 01/15/2024 0.7    M Protein SerPl Elph-Mcnc 01/15/2024 0.3 (H)    Globulin, Total 01/15/2024 3.2    Albumin /Glob SerPl 01/15/2024 1.1    IFE 1 01/15/2024 Comment (A)    Please Note 01/15/2024 Comment   Appointment on 12/18/2023  Component Date Value   WBC Count 12/18/2023 10.4    RBC 12/18/2023 5.55    Hemoglobin 12/18/2023 14.4    HCT 12/18/2023 42.6    MCV 12/18/2023 76.8 (L)    MCH 12/18/2023 25.9 (L)    MCHC 12/18/2023 33.8    RDW 12/18/2023 15.7 (H)    Platelet Count 12/18/2023 384    nRBC 12/18/2023 0.0    Neutrophils Relative % 12/18/2023 73    Neutro Abs 12/18/2023 7.6     Lymphocytes Relative 12/18/2023 19    Lymphs Abs 12/18/2023 2.0    Monocytes Relative 12/18/2023 7    Monocytes Absolute 12/18/2023 0.7    Eosinophils Relative 12/18/2023 0    Eosinophils Absolute 12/18/2023 0.0    Basophils Relative 12/18/2023 0    Basophils Absolute 12/18/2023 0.0    Immature Granulocytes 12/18/2023 1    Abs Immature Granulocytes 12/18/2023 0.10 (H)    Sodium 12/18/2023 138    Potassium 12/18/2023 3.4 (L)    Chloride 12/18/2023 103    CO2 12/18/2023 25    Glucose, Bld 12/18/2023 234 (H)    BUN 12/18/2023 9    Creatinine, Ser 12/18/2023 0.69    Calcium  12/18/2023 9.0    Total Protein 12/18/2023 6.7    Albumin  12/18/2023 3.7    AST 12/18/2023 30    ALT 12/18/2023 39    Alkaline Phosphatase 12/18/2023 100    Total Bilirubin 12/18/2023 0.5    GFR, Estimated 12/18/2023 >60    Anion gap 12/18/2023 10    IgG (Immunoglobin G), Se* 12/18/2023 584 (L)    IgA 12/18/2023 106    IgM (Immunoglobulin M), * 12/18/2023 19 (L)    Total Protein ELP 12/18/2023 6.0    Albumin  SerPl Elph-Mcnc 12/18/2023 2.8 (L)    Alpha 1 12/18/2023 0.4    Alpha2 Glob SerPl Elph-M* 12/18/2023 1.5 (H)    B-Globulin SerPl Elph-Mc* 12/18/2023 0.9    Gamma Glob SerPl Elph-Mc* 12/18/2023 0.4    M Protein SerPl Elph-Mcnc 12/18/2023 0.2 (H)    Globulin, Total 12/18/2023 3.2    Albumin /Glob SerPl 12/18/2023 0.9    IFE 1 12/18/2023 Comment (A)    Please Note 12/18/2023 Comment   Lab on 12/09/2023  Component Date Value   Hgb A1c MFr Bld 12/09/2023 8.4 (H)    Mean Plasma Glucose 12/09/2023 194    eAG (mmol/L) 12/09/2023 10.8   There may be more visits with results that are not included.  No image results found. No results found.      03/21/2024    8:44 AM 02/22/2024    8:54 AM 09/09/2023    8:31 AM 06/09/2023    8:44 AM  PHQ 2/9 Scores  PHQ - 2 Score 0 0 0 0   Results Procedure: Continuous Glucose Monitor (CGM) Placement Description: The skin was cleaned. The CGM sensor was placed on the  patient's arm in a flat area with minimal muscle movement. The sensor was held in place to ensure adhesion. The patient was instructed to start the new sensor using his phone and scan the sensor to activate it.  I worked with the interpreter to help him understand how to scan and apply, but he was not comfortable applying it himself.  LABS HbA1c: 5.8% (02/2024)    Assessment & Plan Diabetes mellitus with proteinuria (HCC) Brief visit with continuous glucose monitor training only, due to planto review data was not able to be done. I coached on proper application scanning and usage of the libre device. Language barrier affecting health care Interpreter was used    This document was synthesized by artificial intelligence ([employer]) using HIPAA-compliant recording of the clinical interaction;   We discussed the use of AI scribe software for clinical note transcription with the patient, who gave verbal consent to proceed. additional Info: This encounter employed state-of-the-art, real-time, collaborative documentation. The patient actively reviewed and assisted in updating their electronic medical record on a shared screen, ensuring transparency and facilitating joint problem-solving for the problem list, overview, and plan. This approach promotes accurate, informed care. The treatment plan was discussed and reviewed in detail, including medication safety, potential side effects, and all patient questions. We confirmed understanding and comfort with the plan. Follow-up instructions were established, including contacting the office for any concerns, returning if symptoms worsen, persist, or new symptoms develop, and precautions for potential emergency department visits.

## 2024-05-03 NOTE — Assessment & Plan Note (Signed)
Interpreter was used

## 2024-05-04 ENCOUNTER — Other Ambulatory Visit: Payer: Self-pay

## 2024-05-05 ENCOUNTER — Encounter: Payer: Self-pay | Admitting: Hematology

## 2024-05-05 ENCOUNTER — Other Ambulatory Visit: Payer: Self-pay

## 2024-05-12 NOTE — Addendum Note (Signed)
 Addended by: Krystian Ferrentino M on: 05/12/2024 09:24 AM   Modules accepted: Orders

## 2024-05-13 ENCOUNTER — Inpatient Hospital Stay

## 2024-05-13 VITALS — BP 129/69 | HR 67 | Temp 98.0°F | Resp 14 | Wt 164.2 lb

## 2024-05-13 DIAGNOSIS — Z7189 Other specified counseling: Secondary | ICD-10-CM

## 2024-05-13 DIAGNOSIS — C9001 Multiple myeloma in remission: Secondary | ICD-10-CM

## 2024-05-13 DIAGNOSIS — Z5112 Encounter for antineoplastic immunotherapy: Secondary | ICD-10-CM | POA: Diagnosis not present

## 2024-05-13 LAB — CBC WITH DIFFERENTIAL (CANCER CENTER ONLY)
Abs Immature Granulocytes: 0.02 10*3/uL (ref 0.00–0.07)
Basophils Absolute: 0.2 10*3/uL — ABNORMAL HIGH (ref 0.0–0.1)
Basophils Relative: 3 %
Eosinophils Absolute: 0.3 10*3/uL (ref 0.0–0.5)
Eosinophils Relative: 5 %
HCT: 41.1 % (ref 39.0–52.0)
Hemoglobin: 13.9 g/dL (ref 13.0–17.0)
Immature Granulocytes: 0 %
Lymphocytes Relative: 37 %
Lymphs Abs: 2.3 10*3/uL (ref 0.7–4.0)
MCH: 26.4 pg (ref 26.0–34.0)
MCHC: 33.8 g/dL (ref 30.0–36.0)
MCV: 78 fL — ABNORMAL LOW (ref 80.0–100.0)
Monocytes Absolute: 0.6 10*3/uL (ref 0.1–1.0)
Monocytes Relative: 9 %
Neutro Abs: 2.9 10*3/uL (ref 1.7–7.7)
Neutrophils Relative %: 46 %
Platelet Count: 208 10*3/uL (ref 150–400)
RBC: 5.27 MIL/uL (ref 4.22–5.81)
RDW: 15.1 % (ref 11.5–15.5)
WBC Count: 6.2 10*3/uL (ref 4.0–10.5)
nRBC: 0 % (ref 0.0–0.2)

## 2024-05-13 LAB — COMPREHENSIVE METABOLIC PANEL WITH GFR
ALT: 14 U/L (ref 0–44)
AST: 14 U/L — ABNORMAL LOW (ref 15–41)
Albumin: 3.7 g/dL (ref 3.5–5.0)
Alkaline Phosphatase: 46 U/L (ref 38–126)
Anion gap: 4 — ABNORMAL LOW (ref 5–15)
BUN: 14 mg/dL (ref 6–20)
CO2: 27 mmol/L (ref 22–32)
Calcium: 8.8 mg/dL — ABNORMAL LOW (ref 8.9–10.3)
Chloride: 110 mmol/L (ref 98–111)
Creatinine, Ser: 0.71 mg/dL (ref 0.61–1.24)
GFR, Estimated: >60 mL/min (ref >60)
Glucose, Bld: 147 mg/dL — ABNORMAL HIGH (ref 70–99)
Potassium: 3.2 mmol/L — ABNORMAL LOW (ref 3.5–5.1)
Sodium: 141 mmol/L (ref 135–145)
Total Bilirubin: 0.6 mg/dL (ref 0.0–1.2)
Total Protein: 5.9 g/dL — ABNORMAL LOW (ref 6.5–8.1)

## 2024-05-13 MED ORDER — ACETAMINOPHEN 325 MG PO TABS
650.0000 mg | ORAL_TABLET | Freq: Once | ORAL | Status: AC
Start: 2024-05-13 — End: 2024-05-13
  Administered 2024-05-13: 650 mg via ORAL
  Filled 2024-05-13: qty 2

## 2024-05-13 MED ORDER — MONTELUKAST SODIUM 10 MG PO TABS
10.0000 mg | ORAL_TABLET | Freq: Once | ORAL | Status: AC
  Administered 2024-05-13: 10 mg via ORAL
  Filled 2024-05-13: qty 1

## 2024-05-13 MED ORDER — FAMOTIDINE IN NACL 20-0.9 MG/50ML-% IV SOLN
20.0000 mg | Freq: Once | INTRAVENOUS | Status: AC
  Administered 2024-05-13: 20 mg via INTRAVENOUS
  Filled 2024-05-13: qty 50

## 2024-05-13 MED ORDER — SODIUM CHLORIDE 0.9 % IV SOLN
16.0000 mg/kg | Freq: Once | INTRAVENOUS | Status: AC
  Administered 2024-05-13: 1300 mg via INTRAVENOUS
  Filled 2024-05-13: qty 5

## 2024-05-13 MED ORDER — DEXAMETHASONE SODIUM PHOSPHATE 10 MG/ML IJ SOLN
2.0000 mg | Freq: Once | INTRAMUSCULAR | Status: AC
  Administered 2024-05-13: 2 mg via INTRAVENOUS
  Filled 2024-05-13: qty 1

## 2024-05-13 MED ORDER — DIPHENHYDRAMINE HCL 25 MG PO CAPS
50.0000 mg | ORAL_CAPSULE | Freq: Once | ORAL | Status: AC
  Administered 2024-05-13: 50 mg via ORAL
  Filled 2024-05-13: qty 2

## 2024-05-13 MED ORDER — SODIUM CHLORIDE 0.9 % IV SOLN: Freq: Once | INTRAVENOUS | Status: AC

## 2024-05-13 NOTE — Patient Instructions (Signed)
 [facility] - A DEPT OF [facility]  Discharge Instructions: Thank you for choosing [facility] to provide your oncology and hematology care.   If you have a lab appointment with the Cancer Center, please go directly to the Cancer Center and check in at the registration area.   Wear comfortable clothing and clothing appropriate for easy access to any Portacath or PICC line.   We strive to give you quality time with your provider. You may need to reschedule your appointment if you arrive late (15 or more minutes).  Arriving late affects you and other patients whose appointments are after yours.  Also, if you miss three or more appointments without notifying the office, you may be dismissed from the clinic at the provider's discretion.      For prescription refill requests, have your pharmacy contact our office and allow 72 hours for refills to be completed.    Today you received the following chemotherapy and/or immunotherapy agents Darzalex      To help prevent nausea and vomiting after your treatment, we encourage you to take your nausea medication as directed.  BELOW ARE SYMPTOMS THAT SHOULD BE REPORTED IMMEDIATELY: *FEVER GREATER THAN 100.4 F (38 C) OR HIGHER *CHILLS OR SWEATING *NAUSEA AND VOMITING THAT IS NOT CONTROLLED WITH YOUR NAUSEA MEDICATION *UNUSUAL SHORTNESS OF BREATH *UNUSUAL BRUISING OR BLEEDING *URINARY PROBLEMS (pain or burning when urinating, or frequent urination) *BOWEL PROBLEMS (unusual diarrhea, constipation, pain near the anus) TENDERNESS IN MOUTH AND THROAT WITH OR WITHOUT PRESENCE OF ULCERS (sore throat, sores in mouth, or a toothache) UNUSUAL RASH, SWELLING OR PAIN  UNUSUAL VAGINAL DISCHARGE OR ITCHING   Items with * indicate a potential emergency and should be followed up as soon as possible or go to the Emergency Department if any problems should occur.  Please show the CHEMOTHERAPY ALERT CARD or IMMUNOTHERAPY  ALERT CARD at check-in to the Emergency Department and triage nurse.  Should you have questions after your visit or need to cancel or reschedule your appointment, please contact [facility] - A DEPT OF [facility]  Dept: [phone]  and follow the prompts.  Office hours are 8:00 a.m. to 4:30 p.m. Monday - Friday. Please note that voicemails left after 4:00 p.m. may not be returned until the following business day.  We are closed weekends and major holidays. You have access to a nurse at all times for urgent questions. Please call the main number to the clinic Dept: [phone] and follow the prompts.   For any non-urgent questions, you may also contact your provider using MyChart. We now offer e-Visits for anyone 20 and older to request care online for non-urgent symptoms. For details visit [email or web address].   Also download the MyChart app! Go to the app store, search "MyChart", open the app, select [place], and log in with your MyChart username and password.

## 2024-05-18 LAB — MULTIPLE MYELOMA PANEL, SERUM
Albumin SerPl Elph-Mcnc: 3.2 g/dL (ref 2.9–4.4)
Albumin/Glob SerPl: 1.4 (ref 0.7–1.7)
Alpha 1: 0.2 g/dL (ref 0.0–0.4)
Alpha2 Glob SerPl Elph-Mcnc: 0.7 g/dL (ref 0.4–1.0)
B-Globulin SerPl Elph-Mcnc: 0.8 g/dL (ref 0.7–1.3)
Gamma Glob SerPl Elph-Mcnc: 0.6 g/dL (ref 0.4–1.8)
Globulin, Total: 2.3 g/dL (ref 2.2–3.9)
IgA: 148 mg/dL (ref 90–386)
IgG (Immunoglobin G), Serum: 600 mg/dL — ABNORMAL LOW (ref 603–1613)
IgM (Immunoglobulin M), Srm: 22 mg/dL (ref 20–172)
M Protein SerPl Elph-Mcnc: 0.2 g/dL — ABNORMAL HIGH
Total Protein ELP: 5.5 g/dL — ABNORMAL LOW (ref 6.0–8.5)

## 2024-05-24 ENCOUNTER — Ambulatory Visit (INDEPENDENT_AMBULATORY_CARE_PROVIDER_SITE_OTHER)

## 2024-05-24 ENCOUNTER — Encounter: Payer: Self-pay | Admitting: Internal Medicine

## 2024-05-24 ENCOUNTER — Ambulatory Visit: Payer: Self-pay | Admitting: Internal Medicine

## 2024-05-24 ENCOUNTER — Ambulatory Visit: Admitting: Internal Medicine

## 2024-05-24 ENCOUNTER — Other Ambulatory Visit: Payer: Self-pay | Admitting: Hematology

## 2024-05-24 VITALS — BP 112/70 | HR 70 | Temp 97.9°F | Ht 66.0 in | Wt 169.4 lb

## 2024-05-24 DIAGNOSIS — Z603 Acculturation difficulty: Secondary | ICD-10-CM

## 2024-05-24 DIAGNOSIS — E1165 Type 2 diabetes mellitus with hyperglycemia: Secondary | ICD-10-CM | POA: Diagnosis not present

## 2024-05-24 DIAGNOSIS — Z758 Other problems related to medical facilities and other health care: Secondary | ICD-10-CM

## 2024-05-24 DIAGNOSIS — R718 Other abnormality of red blood cells: Secondary | ICD-10-CM | POA: Insufficient documentation

## 2024-05-24 DIAGNOSIS — E876 Hypokalemia: Secondary | ICD-10-CM

## 2024-05-24 DIAGNOSIS — E1129 Type 2 diabetes mellitus with other diabetic kidney complication: Secondary | ICD-10-CM | POA: Diagnosis not present

## 2024-05-24 DIAGNOSIS — R809 Proteinuria, unspecified: Secondary | ICD-10-CM

## 2024-05-24 DIAGNOSIS — D801 Nonfamilial hypogammaglobulinemia: Secondary | ICD-10-CM | POA: Insufficient documentation

## 2024-05-24 DIAGNOSIS — C9000 Multiple myeloma not having achieved remission: Secondary | ICD-10-CM

## 2024-05-24 DIAGNOSIS — H538 Other visual disturbances: Secondary | ICD-10-CM

## 2024-05-24 DIAGNOSIS — Z794 Long term (current) use of insulin: Secondary | ICD-10-CM

## 2024-05-24 DIAGNOSIS — D759 Disease of blood and blood-forming organs, unspecified: Secondary | ICD-10-CM | POA: Diagnosis not present

## 2024-05-24 LAB — MICROALBUMIN / CREATININE URINE RATIO
Creatinine,U: 98.6 mg/dL
Microalb Creat Ratio: 27.4 mg/g (ref 0.0–30.0)
Microalb, Ur: 2.7 mg/dL — ABNORMAL HIGH (ref 0.0–1.9)

## 2024-05-24 MED ORDER — DEXCOM G7 SENSOR MISC: 1.0000 | 11 refills | Status: DC

## 2024-05-24 NOTE — Assessment & Plan Note (Signed)
 Blurry vision persists for reading, though distance vision is clear. He has not seen an ophthalmologist, possibly due to a lost referral. Refer to an ophthalmologist for evaluation.

## 2024-05-24 NOTE — Assessment & Plan Note (Signed)
 Traces of kidney damage were detected in December, possibly due to diabetes or multiple myeloma. Current medications, Farxiga  and Losartan , protect the kidneys. The goal is to prevent progression to dialysis. A urine sample will be obtained today to assess protein levels. Continue Farxiga  and Losartan , increase fluid intake during physical activity, and consider additional medication if proteinuria persists.

## 2024-05-24 NOTE — Assessment & Plan Note (Signed)
 Diabetes management is improving, with A1c reduced from 8.4 to 7 over six months. Continuous glucose monitoring was attempted but failed due to sensor displacement from physical activity. Blood glucose levels are satisfactory, and medication adherence is confirmed. Dietary habits, particularly rice consumption, were discussed for improvement. Continuous glucose meters will not be pursued unless available for free. Avoid rice, continue current diabetes medications, and consider alternative sensor placement with tape if monitoring is resumed.

## 2024-05-24 NOTE — Patient Instructions (Signed)
 VISIT SUMMARY:  Today, we discussed your diabetes management, kidney health, and other health concerns. Your blood sugar control has improved, but you are having trouble with your continuous glucose monitor due to your work. We also talked about your kidney health, vision issues, and the importance of using interpreter services to help with your healthcare needs.  YOUR PLAN:  -TYPE 2 DIABETES MELLITUS WITH HYPERGLYCEMIA: Type 2 diabetes is a condition where your body does not use insulin  properly, leading to high blood sugar levels. Your blood sugar control has improved, with your A1c dropping from 8.4% to 7% over the past six months. We discussed your difficulties with the continuous glucose monitor and decided not to pursue it unless it is available for free. Please avoid rice, continue taking your diabetes medications as prescribed, and consider using tape to secure the sensor if you resume using the monitor.  -DIABETES MELLITUS WITH PROTEINURIA: Proteinuria means there is protein in your urine, which can be a sign of kidney damage. This could be due to your diabetes or multiple myeloma. You are taking Farxiga  and Losartan  to protect your kidneys. We will check your urine today to monitor protein levels. Please continue taking your medications, drink more fluids during physical activity, and we may consider additional medication if protein levels remain high.  -RISK OF BLOOD CLOT DUE TO MYELOMA: Multiple myeloma increases your risk of blood clots. You are taking aspirin  to reduce this risk. Please continue taking your aspirin  as prescribed.  -BLURRY VISION: You have blurry vision when reading, but your distance vision is clear. You have not yet seen an eye doctor. We will refer you to an ophthalmologist for an eye exam.  -LANGUAGE BARRIER AFFECTING HEALTH CARE: A language barrier has made it difficult for you to access healthcare, including missing an eye doctor appointment. We recommend using  interpreter services to help schedule your ophthalmology appointment.  -HYPOKALEMIA: Hypokalemia means you have low potassium levels. You are taking potassium supplements and have no muscle cramps, which indicates good management. Please continue taking your potassium supplements, and we will reassess your levels during your next blood work.  INSTRUCTIONS:  Please follow up with the urine test today to check your kidney health. Schedule an appointment with an ophthalmologist for your vision issues, and use interpreter services to help with this. Continue taking all your medications as prescribed, and we will reassess your potassium levels during your next blood work.

## 2024-05-24 NOTE — Assessment & Plan Note (Signed)
 Aspirin  is prescribed to mitigate blood clot risk from multiple myeloma. Continue aspirin  therapy.

## 2024-05-24 NOTE — Progress Notes (Signed)
 Notify  with interpreter:  Most recent microalbumin test shows still faint traces of kidney damage despite taking Farxiga  and losartan .  I therefor recommend we try to add on Kerendia to protect kidneys more - this will flush them out more and while its proven to work with diabetes I think it will also help with the myeloma proteins too.  Call in Kerendia 10 mg daily if patient agreed.      Component                Value               Date                      MICROALBUR               2.7 (H)             05/24/2024                MICROALBUR               12.2 (H)            12/09/2023                MICROALBUR               1.2                 09/09/2023                MICROALBUR               <0.7                06/09/2023                MICROALBUR               2.9 (H)             08/12/2022

## 2024-05-24 NOTE — Assessment & Plan Note (Signed)
 We will add Kerendia if unresolved with Farxiga /losartan . Discussed and Will order lab testing to guide management.

## 2024-05-24 NOTE — Progress Notes (Signed)
 ==============================  [facility]: [phone]   -- Medical Office Visit --  Patient: Donald Suarez      Age: 58 y.o.       Sex:  male  Date:   05/24/2024 Today's Healthcare Provider: Anthon Kins, MD  ==============================   Chief Complaint: Diabetes (Pt states he is doing well today no new concerns at this time.) Review continuous glucose monitor data (but it fell off and there is only a little)  Lab Results  Component Value Date   HGBA1C 7.0 (A) 03/21/2024   HGBA1C 8.4 (H) 12/09/2023   HGBA1C 8.9 (H) 09/09/2023    Lab Results  Component Value Date   GFR 100.57 12/09/2023   GFR 102.43 09/09/2023   GFR 103.96 06/09/2023   GFR 108.05 08/12/2022   Lab Results  Component Value Date   MICROALBUR 2.7 (H) 05/24/2024   MICROALBUR 12.2 (H) 12/09/2023   MICROALBUR 1.2 09/09/2023   MICROALBUR <0.7 06/09/2023   MICROALBUR 2.9 (H) 08/12/2022   Discussed the use of AI scribe software for clinical note transcription with the patient, who gave verbal consent to proceed.  History of Present Illness 58 year old male with diabetes and myeloma who presents for follow-up on glucose monitoring and kidney health.  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 He was able to use the CGM for three days, during which his glucose levels were reported as good. His hemoglobin A1c improved from 8.4% to 7% over the past six months. He is currently taking his diabetes medications as prescribed.  He has a history of myeloma. Previous tests detected traces of kidney damage, and he is scheduled for a urine test today to monitor this. He is taking Farxiga  and losartan  to protect his kidneys. He drinks Gatorade Zero due to low potassium levels and is also taking a potassium supplement. No muscle cramps.  He experiences blurry vision when reading, although his  long-distance vision is clear. He has not yet visited an eye doctor despite a previous referral, possibly due to a language barrier. He is concerned about his diabetes management and is interested in further glucose monitoring if possible.  Background Reviewed: Problem List: has Multiple myeloma in remission (HCC); Counseling regarding advance care planning and goals of care; Hypoalbuminemia; Hyperviscosity; Type 2 diabetes mellitus with hyperglycemia (HCC); Chronic apical periodontitis; Hyperproteinemia; Blurry vision; Hypomagnesemia; Elevated ferritin; Microalbuminuria; Hypocalcemia; Peripheral neuropathy; Language barrier affecting health care; Hypokalemia; History of dental problems; At high risk for venous thromboembolism; Diabetes mellitus with proteinuria (HCC); Hypertension; Chronic kidney disease; Microcytosis; and Hypogammaglobulinemia (HCC) on their problem list. Past Medical History:  has a past medical history of Abfraction (08/06/2022), Accretions on teeth (08/06/2022), Anemia, Attrition, teeth excessive (08/06/2022), Caries (08/06/2022), Defective dental restoration (08/06/2022), Elevated ferritin (08/07/2022), Hypocalcemia (08/19/2022), Loose, teeth (08/06/2022), Malocclusion (08/06/2022), Microalbuminuria (08/19/2022), Periodontal disease (08/06/2022), Peripheral neuropathy (09/08/2022), and Teeth missing (08/06/2022). Past Surgical History:   has a past surgical history that includes Appendectomy; Thermon US  Guide Vasc Access Right (05/15/2022); Pamela Fluoro Guide CV Line Right (05/15/2022); Frazier PATIENT EVAL TECH 0-60 MINS (05/19/2022); and Lillie BONE MARROW BIOPSY & ASPIRATION (02/11/2023). Social History:   reports that he has never smoked. He has never used smokeless tobacco. He reports that he does not drink alcohol and does not use drugs. Family History:  family history is not on file. Allergies:  has no known allergies.  Medication Reconciliation: Current Outpatient Medications on File Prior to  Visit  Medication Sig   Accu-Chek Softclix Lancets lancets Use to test blood sugars up to 4 times daily as directed.   acyclovir  (ZOVIRAX ) 400 MG tablet Take 1 tablet (400 mg total) by mouth 2 (two) times daily.   aspirin  EC 81 MG tablet Take 1 tablet (81 mg total) by mouth daily. Swallow whole.   Blood Glucose Monitoring Suppl (ACCU-CHEK GUIDE) w/Device KIT Use to test blood sugars up to 4 times daily as directed.   Continuous Glucose Receiver (DEXCOM G7 RECEIVER) DEVI 1 Device by Does not apply route continuous.   Cyanocobalamin  (B-12) 1000 MCG CAPS Take 1 tablet by mouth daily.   dapagliflozin  propanediol (FARXIGA ) 10 MG TABS tablet Take 1 tablet (10 mg total) by mouth daily before breakfast. sa asar sa hroi   gabapentin  (NEURONTIN ) 300 MG capsule Take 1 capsule (300 mg total) by mouth 3 (three) times daily.   glucose blood (ACCU-CHEK GUIDE) test strip Use to test blood sugars up to 4 times daily as directed.   losartan  (COZAAR ) 25 MG tablet Take 1 tablet (25 mg total) by mouth daily. Start taking if blood pressure is over 130/80.  Protects kidneys and lowers blood pressure.   metFORMIN  (GLUCOPHAGE ) 500 MG tablet Take 2 tablets (1,000 mg total) by mouth 2 (two) times daily with a meal. 2 asar 1 wot, 2 wot mnhum  amang 1 hroi   potassium chloride  SA (KLOR-CON  M) 20 MEQ tablet TAKE 1 TABLET BY MOUTH TWICE A DAY   rosuvastatin  (CRESTOR ) 10 MG tablet Take 1 tablet (10 mg total) by mouth daily.   tirzepatide  (MOUNJARO ) 10 MG/0.5ML Pen Inject 10 mg into the skin once a week.   Current Facility-Administered Medications on File Prior to Visit  Medication   clotrimazole  (LOTRIMIN ) 1 % cream   Medications Discontinued During This Encounter  Medication Reason   Continuous Glucose Sensor (DEXCOM G7 SENSOR) MISC Reorder     Physical Exam:    05/24/2024    7:52 AM 05/13/2024    3:08 PM 05/13/2024   12:29 PM  Vitals with BMI  Height 5\' 6"     Weight 169 lbs 6 oz  164 lbs 4 oz  BMI 27.35  26.52   Systolic 112 129 161  Diastolic 70 69 78  Pulse 70 67 67  Vital signs reviewed.  Nursing notes reviewed. Weight trend reviewed. Physical Exam General Appearance:  No acute distress appreciable.   Well-groomed, healthy-appearing male.  Well proportioned with no abnormal fat distribution.  Good muscle tone. Pulmonary:  Normal work of breathing at rest, no respiratory distress apparent. SpO2: 98 %  Musculoskeletal: All extremities are intact.  Neurological:  Awake, alert, oriented, and engaged.  No obvious focal neurological deficits or cognitive impairments.  Sensorium seems unclouded.   Speech is clear and coherent with logical content. Psychiatric:  Appropriate mood, pleasant and cooperative demeanor, thoughtful and engaged during the exam Physical Exam Interpreter assists, some english but broken, heavy assist by interpreter.  Very cordial and bright smile.      Results for orders placed or performed in visit on 05/24/24  Microalbumin / creatinine urine ratio  Result Value Ref Range   Microalb, Ur 2.7 (H) 0.0 - 1.9 mg/dL   Creatinine,U 09.6 mg/dL   Microalb Creat Ratio 27.4 0.0 - 30.0 mg/g   Appointment on 05/24/2024  Component Date Value   Microalb, Ur 05/24/2024 2.7 (H)    Creatinine,U 05/24/2024 98.6  Microalb Creat Ratio 05/24/2024 27.4   Appointment on 05/13/2024  Component Date Value   IgG (Immunoglobin G), Se* 05/13/2024 600 (L)    IgA 05/13/2024 148    IgM (Immunoglobulin M), * 05/13/2024 22    Total Protein ELP 05/13/2024 5.5 (L)    Albumin  SerPl Elph-Mcnc 05/13/2024 3.2    Alpha 1 05/13/2024 0.2    Alpha2 Glob SerPl Elph-M* 05/13/2024 0.7    B-Globulin SerPl Elph-Mc* 05/13/2024 0.8    Gamma Glob SerPl Elph-Mc* 05/13/2024 0.6    M Protein SerPl Elph-Mcnc 05/13/2024 0.2 (H)    Globulin, Total 05/13/2024 2.3    Albumin /Glob SerPl 05/13/2024 1.4    IFE 1 05/13/2024 Comment (A)    Please Note 05/13/2024 Comment    Sodium 05/13/2024 141    Potassium 05/13/2024  3.2 (L)    Chloride 05/13/2024 110    CO2 05/13/2024 27    Glucose, Bld 05/13/2024 147 (H)    BUN 05/13/2024 14    Creatinine, Ser 05/13/2024 0.71    Calcium  05/13/2024 8.8 (L)    Total Protein 05/13/2024 5.9 (L)    Albumin  05/13/2024 3.7    AST 05/13/2024 14 (L)    ALT 05/13/2024 14    Alkaline Phosphatase 05/13/2024 46    Total Bilirubin 05/13/2024 0.6    GFR, Estimated 05/13/2024 >60    Anion gap 05/13/2024 4 (L)    WBC Count 05/13/2024 6.2    RBC 05/13/2024 5.27    Hemoglobin 05/13/2024 13.9    HCT 05/13/2024 41.1    MCV 05/13/2024 78.0 (L)    MCH 05/13/2024 26.4    MCHC 05/13/2024 33.8    RDW 05/13/2024 15.1    Platelet Count 05/13/2024 208    nRBC 05/13/2024 0.0    Neutrophils Relative % 05/13/2024 46    Neutro Abs 05/13/2024 2.9    Lymphocytes Relative 05/13/2024 37    Lymphs Abs 05/13/2024 2.3    Monocytes Relative 05/13/2024 9    Monocytes Absolute 05/13/2024 0.6    Eosinophils Relative 05/13/2024 5    Eosinophils Absolute 05/13/2024 0.3    Basophils Relative 05/13/2024 3    Basophils Absolute 05/13/2024 0.2 (H)    Immature Granulocytes 05/13/2024 0    Abs Immature Granulocytes 05/13/2024 0.02   Appointment on 04/29/2024  Component Date Value   WBC Count 04/29/2024 5.0    RBC 04/29/2024 5.36    Hemoglobin 04/29/2024 14.5    HCT 04/29/2024 42.2    MCV 04/29/2024 78.7 (L)    MCH 04/29/2024 27.1    MCHC 04/29/2024 34.4    RDW 04/29/2024 15.1    Platelet Count 04/29/2024 143 (L)    nRBC 04/29/2024 0.0    Neutrophils Relative % 04/29/2024 40    Neutro Abs 04/29/2024 2.0    Lymphocytes Relative 04/29/2024 37    Lymphs Abs 04/29/2024 1.9    Monocytes Relative 04/29/2024 14    Monocytes Absolute 04/29/2024 0.7    Eosinophils Relative 04/29/2024 7    Eosinophils Absolute 04/29/2024 0.3    Basophils Relative 04/29/2024 2    Basophils Absolute 04/29/2024 0.1    Immature Granulocytes 04/29/2024 0    Abs Immature Granulocytes 04/29/2024 0.01    Sodium  04/29/2024 141    Potassium 04/29/2024 3.0 (L)    Chloride 04/29/2024 109    CO2 04/29/2024 27    Glucose, Bld 04/29/2024 139 (H)    BUN 04/29/2024 13    Creatinine, Ser 04/29/2024 0.72    Calcium  04/29/2024 8.4 (L)  Total Protein 04/29/2024 6.0 (L)    Albumin  04/29/2024 4.0    AST 04/29/2024 18    ALT 04/29/2024 27    Alkaline Phosphatase 04/29/2024 51    Total Bilirubin 04/29/2024 0.6    GFR, Estimated 04/29/2024 >60    Anion gap 04/29/2024 5    IgG (Immunoglobin G), Se* 04/29/2024 659    IgA 04/29/2024 142    IgM (Immunoglobulin M), * 04/29/2024 19 (L)    Total Protein ELP 04/29/2024 5.6 (L)    Albumin  SerPl Elph-Mcnc 04/29/2024 3.4    Alpha 1 04/29/2024 0.1    Alpha2 Glob SerPl Elph-M* 04/29/2024 0.7    B-Globulin SerPl Elph-Mc* 04/29/2024 0.8    Gamma Glob SerPl Elph-Mc* 04/29/2024 0.6    M Protein SerPl Elph-Mcnc 04/29/2024 0.3 (H)    Globulin, Total 04/29/2024 2.2    Albumin /Glob SerPl 04/29/2024 1.6    IFE 1 04/29/2024 Comment (A)    Please Note 04/29/2024 Comment   Appointment on 04/15/2024  Component Date Value   IgG (Immunoglobin G), Se* 04/15/2024 647    IgA 04/15/2024 137    IgM (Immunoglobulin M), * 04/15/2024 20    Total Protein ELP 04/15/2024 5.6 (L)    Albumin  SerPl Elph-Mcnc 04/15/2024 3.4    Alpha 1 04/15/2024 0.1    Alpha2 Glob SerPl Elph-M* 04/15/2024 0.7    B-Globulin SerPl Elph-Mc* 04/15/2024 0.7    Gamma Glob SerPl Elph-Mc* 04/15/2024 0.6    M Protein SerPl Elph-Mcnc 04/15/2024 0.3 (H)    Globulin, Total 04/15/2024 2.2    Albumin /Glob SerPl 04/15/2024 1.6    IFE 1 04/15/2024 Comment (A)    Please Note 04/15/2024 Comment    Sodium 04/15/2024 140    Potassium 04/15/2024 3.3 (L)    Chloride 04/15/2024 109    CO2 04/15/2024 24    Glucose, Bld 04/15/2024 169 (H)    BUN 04/15/2024 12    Creatinine, Ser 04/15/2024 0.69    Calcium  04/15/2024 8.3 (L)    Total Protein 04/15/2024 5.9 (L)    Albumin  04/15/2024 3.9    AST 04/15/2024 16    ALT  04/15/2024 15    Alkaline Phosphatase 04/15/2024 44    Total Bilirubin 04/15/2024 0.5    GFR, Estimated 04/15/2024 >60    Anion gap 04/15/2024 7    WBC Count 04/15/2024 5.0    RBC 04/15/2024 5.25    Hemoglobin 04/15/2024 14.1    HCT 04/15/2024 41.4    MCV 04/15/2024 78.9 (L)    MCH 04/15/2024 26.9    MCHC 04/15/2024 34.1    RDW 04/15/2024 14.9    Platelet Count 04/15/2024 201    nRBC 04/15/2024 0.0    Neutrophils Relative % 04/15/2024 35    Neutro Abs 04/15/2024 1.8    Lymphocytes Relative 04/15/2024 47    Lymphs Abs 04/15/2024 2.3    Monocytes Relative 04/15/2024 11    Monocytes Absolute 04/15/2024 0.6    Eosinophils Relative 04/15/2024 6    Eosinophils Absolute 04/15/2024 0.3    Basophils Relative 04/15/2024 1    Basophils Absolute 04/15/2024 0.1    Immature Granulocytes 04/15/2024 0    Abs Immature Granulocytes 04/15/2024 0.01   Appointment on 03/25/2024  Component Date Value   WBC Count 03/25/2024 5.9    RBC 03/25/2024 5.34    Hemoglobin 03/25/2024 14.3    HCT 03/25/2024 43.2    MCV 03/25/2024 80.9    MCH 03/25/2024 26.8    MCHC 03/25/2024 33.1    RDW 03/25/2024 15.5  Platelet Count 03/25/2024 144 (L)    nRBC 03/25/2024 0.0    Neutrophils Relative % 03/25/2024 49    Neutro Abs 03/25/2024 2.9    Lymphocytes Relative 03/25/2024 32    Lymphs Abs 03/25/2024 1.9    Monocytes Relative 03/25/2024 7    Monocytes Absolute 03/25/2024 0.4    Eosinophils Relative 03/25/2024 10    Eosinophils Absolute 03/25/2024 0.6 (H)    Basophils Relative 03/25/2024 2    Basophils Absolute 03/25/2024 0.1    Immature Granulocytes 03/25/2024 0    Abs Immature Granulocytes 03/25/2024 0.01    Sodium 03/25/2024 139    Potassium 03/25/2024 3.4 (L)    Chloride 03/25/2024 108    CO2 03/25/2024 25    Glucose, Bld 03/25/2024 172 (H)    BUN 03/25/2024 14    Creatinine, Ser 03/25/2024 0.77    Calcium  03/25/2024 8.8 (L)    Total Protein 03/25/2024 6.2 (L)    Albumin  03/25/2024 4.0    AST  03/25/2024 13 (L)    ALT 03/25/2024 11    Alkaline Phosphatase 03/25/2024 51    Total Bilirubin 03/25/2024 0.8    GFR, Estimated 03/25/2024 >60    Anion gap 03/25/2024 6    IgG (Immunoglobin G), Se* 03/25/2024 742    IgA 03/25/2024 137    IgM (Immunoglobulin M), * 03/25/2024 21    Total Protein ELP 03/25/2024 6.0    Albumin  SerPl Elph-Mcnc 03/25/2024 3.4    Alpha 1 03/25/2024 0.2    Alpha2 Glob SerPl Elph-M* 03/25/2024 0.9    B-Globulin SerPl Elph-Mc* 03/25/2024 0.9    Gamma Glob SerPl Elph-Mc* 03/25/2024 0.7    M Protein SerPl Elph-Mcnc 03/25/2024 0.3 (H)    Globulin, Total 03/25/2024 2.6    Albumin /Glob SerPl 03/25/2024 1.4    IFE 1 03/25/2024 Comment (A)    Please Note 03/25/2024 Comment   Office Visit on 03/21/2024  Component Date Value   Hemoglobin A1C 03/21/2024 7.0 (A)   Appointment on 03/11/2024  Component Date Value   WBC Count 03/11/2024 6.1    RBC 03/11/2024 5.74    Hemoglobin 03/11/2024 15.3    HCT 03/11/2024 45.9    MCV 03/11/2024 80.0    MCH 03/11/2024 26.7    MCHC 03/11/2024 33.3    RDW 03/11/2024 15.9 (H)    Platelet Count 03/11/2024 193    nRBC 03/11/2024 0.0    Neutrophils Relative % 03/11/2024 38    Neutro Abs 03/11/2024 2.3    Lymphocytes Relative 03/11/2024 43    Lymphs Abs 03/11/2024 2.6    Monocytes Relative 03/11/2024 11    Monocytes Absolute 03/11/2024 0.7    Eosinophils Relative 03/11/2024 6    Eosinophils Absolute 03/11/2024 0.3    Basophils Relative 03/11/2024 2    Basophils Absolute 03/11/2024 0.1    Immature Granulocytes 03/11/2024 0    Abs Immature Granulocytes 03/11/2024 0.02    Sodium 03/11/2024 140    Potassium 03/11/2024 3.2 (L)    Chloride 03/11/2024 108    CO2 03/11/2024 25    Glucose, Bld 03/11/2024 205 (H)    BUN 03/11/2024 15    Creatinine, Ser 03/11/2024 0.70    Calcium  03/11/2024 8.6 (L)    Total Protein 03/11/2024 6.7    Albumin  03/11/2024 4.2    AST 03/11/2024 16    ALT 03/11/2024 35    Alkaline Phosphatase  03/11/2024 58    Total Bilirubin 03/11/2024 0.8    GFR, Estimated 03/11/2024 >60    Anion gap 03/11/2024 7  IgG (Immunoglobin G), Se* 03/11/2024 729    IgA 03/11/2024 155    IgM (Immunoglobulin M), * 03/11/2024 26    Total Protein ELP 03/11/2024 6.4    Albumin  SerPl Elph-Mcnc 03/11/2024 3.6    Alpha 1 03/11/2024 0.2    Alpha2 Glob SerPl Elph-M* 03/11/2024 0.9    B-Globulin SerPl Elph-Mc* 03/11/2024 1.0    Gamma Glob SerPl Elph-Mc* 03/11/2024 0.7    M Protein SerPl Elph-Mcnc 03/11/2024 0.3 (H)    Globulin, Total 03/11/2024 2.8    Albumin /Glob SerPl 03/11/2024 1.3    IFE 1 03/11/2024 Comment (A)    Please Note 03/11/2024 Comment   Appointment on 02/26/2024  Component Date Value   WBC Count 02/26/2024 6.5    RBC 02/26/2024 5.60    Hemoglobin 02/26/2024 14.9    HCT 02/26/2024 44.8    MCV 02/26/2024 80.0    MCH 02/26/2024 26.6    MCHC 02/26/2024 33.3    RDW 02/26/2024 16.1 (H)    Platelet Count 02/26/2024 184    nRBC 02/26/2024 0.0    Neutrophils Relative % 02/26/2024 47    Neutro Abs 02/26/2024 3.0    Lymphocytes Relative 02/26/2024 38    Lymphs Abs 02/26/2024 2.5    Monocytes Relative 02/26/2024 8    Monocytes Absolute 02/26/2024 0.5    Eosinophils Relative 02/26/2024 6    Eosinophils Absolute 02/26/2024 0.4    Basophils Relative 02/26/2024 1    Basophils Absolute 02/26/2024 0.1    Immature Granulocytes 02/26/2024 0    Abs Immature Granulocytes 02/26/2024 0.02    Sodium 02/26/2024 137    Potassium 02/26/2024 3.4 (L)    Chloride 02/26/2024 105    CO2 02/26/2024 26    Glucose, Bld 02/26/2024 207 (H)    BUN 02/26/2024 9    Creatinine, Ser 02/26/2024 0.69    Calcium  02/26/2024 8.9    Total Protein 02/26/2024 6.3 (L)    Albumin  02/26/2024 4.0    AST 02/26/2024 13 (L)    ALT 02/26/2024 21    Alkaline Phosphatase 02/26/2024 52    Total Bilirubin 02/26/2024 0.8    GFR, Estimated 02/26/2024 >60    Anion gap 02/26/2024 6    IgG (Immunoglobin G), Se* 02/26/2024 728     IgA 02/26/2024 117    IgM (Immunoglobulin M), * 02/26/2024 29    Total Protein ELP 02/26/2024 5.9 (L)    Albumin  SerPl Elph-Mcnc 02/26/2024 3.5    Alpha 1 02/26/2024 0.3    Alpha2 Glob SerPl Elph-M* 02/26/2024 0.6    B-Globulin SerPl Elph-Mc* 02/26/2024 0.9    Gamma Glob SerPl Elph-Mc* 02/26/2024 0.6    M Protein SerPl Elph-Mcnc 02/26/2024 0.3 (H)    Globulin, Total 02/26/2024 2.4    Albumin /Glob SerPl 02/26/2024 1.5    IFE 1 02/26/2024 Comment (A)    Please Note 02/26/2024 Comment   Appointment on 02/12/2024  Component Date Value   WBC Count 02/12/2024 7.0    RBC 02/12/2024 5.81    Hemoglobin 02/12/2024 15.3    HCT 02/12/2024 45.6    MCV 02/12/2024 78.5 (L)    MCH 02/12/2024 26.3    MCHC 02/12/2024 33.6    RDW 02/12/2024 15.4    Platelet Count 02/12/2024 126 (L)    nRBC 02/12/2024 0.0    Neutrophils Relative % 02/12/2024 41    Neutro Abs 02/12/2024 2.8    Lymphocytes Relative 02/12/2024 42    Lymphs Abs 02/12/2024 3.0    Monocytes Relative 02/12/2024 8    Monocytes Absolute  02/12/2024 0.5    Eosinophils Relative 02/12/2024 8    Eosinophils Absolute 02/12/2024 0.5    Basophils Relative 02/12/2024 1    Basophils Absolute 02/12/2024 0.1    Immature Granulocytes 02/12/2024 0    Abs Immature Granulocytes 02/12/2024 0.02    IgG (Immunoglobin G), Se* 02/12/2024 800    IgA 02/12/2024 108    IgM (Immunoglobulin M), * 02/12/2024 25    Total Protein ELP 02/12/2024 6.2    Albumin  SerPl Elph-Mcnc 02/12/2024 3.8    Alpha 1 02/12/2024 0.2    Alpha2 Glob SerPl Elph-M* 02/12/2024 0.8    B-Globulin SerPl Elph-Mc* 02/12/2024 0.8    Gamma Glob SerPl Elph-Mc* 02/12/2024 0.7    M Protein SerPl Elph-Mcnc 02/12/2024 0.3 (H)    Globulin, Total 02/12/2024 2.4    Albumin /Glob SerPl 02/12/2024 1.6    IFE 1 02/12/2024 Comment (A)    Please Note 02/12/2024 Comment    Sodium 02/12/2024 138    Potassium 02/12/2024 2.9 (L)    Chloride 02/12/2024 102    CO2 02/12/2024 28    Glucose, Bld  02/12/2024 211 (H)    BUN 02/12/2024 11    Creatinine 02/12/2024 0.85    Calcium  02/12/2024 9.1    Total Protein 02/12/2024 6.6    Albumin  02/12/2024 4.2    AST 02/12/2024 11 (L)    ALT 02/12/2024 12    Alkaline Phosphatase 02/12/2024 50    Total Bilirubin 02/12/2024 0.7    GFR, Estimated 02/12/2024 >60    Anion gap 02/12/2024 8   Appointment on 01/15/2024  Component Date Value   WBC Count 01/15/2024 11.5 (H)    RBC 01/15/2024 5.67    Hemoglobin 01/15/2024 15.0    HCT 01/15/2024 45.3    MCV 01/15/2024 79.9 (L)    MCH 01/15/2024 26.5    MCHC 01/15/2024 33.1    RDW 01/15/2024 16.2 (H)    Platelet Count 01/15/2024 172    nRBC 01/15/2024 0.0    Neutrophils Relative % 01/15/2024 54    Neutro Abs 01/15/2024 6.3    Lymphocytes Relative 01/15/2024 31    Lymphs Abs 01/15/2024 3.5    Monocytes Relative 01/15/2024 13    Monocytes Absolute 01/15/2024 1.5 (H)    Eosinophils Relative 01/15/2024 1    Eosinophils Absolute 01/15/2024 0.1    Basophils Relative 01/15/2024 1    Basophils Absolute 01/15/2024 0.1    Immature Granulocytes 01/15/2024 0    Abs Immature Granulocytes 01/15/2024 0.04    Sodium 01/15/2024 137    Potassium 01/15/2024 3.4 (L)    Chloride 01/15/2024 104    CO2 01/15/2024 24    Glucose, Bld 01/15/2024 191 (H)    BUN 01/15/2024 13    Creatinine, Ser 01/15/2024 0.79    Calcium  01/15/2024 9.5    Total Protein 01/15/2024 7.1    Albumin  01/15/2024 4.1    AST 01/15/2024 13 (L)    ALT 01/15/2024 17    Alkaline Phosphatase 01/15/2024 70    Total Bilirubin 01/15/2024 0.6    GFR, Estimated 01/15/2024 >60    Anion gap 01/15/2024 9    IgG (Immunoglobin G), Se* 01/15/2024 784    IgA 01/15/2024 119    IgM (Immunoglobulin M), * 01/15/2024 25    Total Protein ELP 01/15/2024 6.5    Albumin  SerPl Elph-Mcnc 01/15/2024 3.3    Alpha 1 01/15/2024 0.3    Alpha2 Glob SerPl Elph-M* 01/15/2024 1.2 (H)    B-Globulin SerPl Elph-Mc* 01/15/2024 1.0    Gamma Glob SerPl  Elph-Mc*  01/15/2024 0.7    M Protein SerPl Elph-Mcnc 01/15/2024 0.3 (H)    Globulin, Total 01/15/2024 3.2    Albumin /Glob SerPl 01/15/2024 1.1    IFE 1 01/15/2024 Comment (A)    Please Note 01/15/2024 Comment   There may be more visits with results that are not included.  No image results found. No results found.      03/21/2024    8:44 AM 02/22/2024    8:54 AM 09/09/2023    8:31 AM 06/09/2023    8:44 AM  PHQ 2/9 Scores  PHQ - 2 Score 0 0 0 0   Results LABS that were discussed (shown in HPI) HbA1c: 7.0% (02/2024) eGFR: >90 mL/min/1.58m (11/2023) Urine protein: trace (11/2023)    Assessment & Plan Type 2 diabetes mellitus with hyperglycemia, with long-term current use of insulin  (HCC) Diabetes management is improving, with A1c reduced from 8.4 to 7 over six months. Continuous glucose monitoring was attempted but failed due to sensor displacement from physical activity. Blood glucose levels are satisfactory, and medication adherence is confirmed. Dietary habits, particularly rice consumption, were discussed for improvement. Continuous glucose meters will not be pursued unless available for free. Avoid rice, continue current diabetes medications, and consider alternative sensor placement with tape if monitoring is resumed. Language barrier affecting health care A language barrier has affected healthcare access, including a missed ophthalmology referral. Interpreter assistance is beneficial. Utilize interpreter services for scheduling the ophthalmology appointment. Rearrange ophthalmology appointment. Diabetes mellitus with proteinuria (HCC) Traces of kidney damage were detected in December, possibly due to diabetes or multiple myeloma. Current medications, Farxiga  and Losartan , protect the kidneys. The goal is to prevent progression to dialysis. A urine sample will be obtained today to assess protein levels. Continue Farxiga  and Losartan , increase fluid intake during physical activity, and consider  additional medication if proteinuria persists. Hyperviscosity Aspirin  is prescribed to mitigate blood clot risk from multiple myeloma. Continue aspirin  therapy. Microalbuminuria We will add Kerendia if unresolved with Farxiga /losartan . Discussed and Will order lab testing to guide management.  Hypokalemia Potassium levels are managed with supplements, and he reports no muscle cramps, indicating effective management. Continue potassium supplementation and reassess levels during the next blood work. Microcytosis Noted- likely related with Revlimid  and or multiple myeloma Hypogammaglobulinemia (HCC) Noted- likely related with Revlimid  and or multiple myeloma Blurry vision Blurry vision persists for reading, though distance vision is clear. He has not seen an ophthalmologist, possibly due to a lost referral. Refer to an ophthalmologist for evaluation.        Orders Placed During this Encounter:   Orders Placed This Encounter  Procedures   Microalbumin / creatinine urine ratio    Standing Status:   Future    Number of Occurrences:   1    Expiration Date:   05/24/2025   Ambulatory referral to Ophthalmology    Referral Priority:   Routine    Referral Type:   Consultation    Referral Reason:   Specialty Services Required    Requested Specialty:   Ophthalmology    Number of Visits Requested:   1   Meds ordered this encounter  Medications   Continuous Glucose Sensor (DEXCOM G7 SENSOR) MISC    Sig: 1 each by Does not apply route continuous. Change sensor every ten days.    Dispense:  3 each    Refill:  11   ED Discharge Orders          Ordered    Continuous Glucose Sensor (  DEXCOM G7 SENSOR) MISC  Continuous        05/24/24 0804    Microalbumin / creatinine urine ratio        05/24/24 0812    Ambulatory referral to Ophthalmology        05/24/24 0812            Medical Decision Making: 2 or more stable chronic illnesses Diagnosis or treatment significantly limited by social  determinants of health      This document was synthesized by artificial intelligence (Abridge) using HIPAA-compliant recording of the clinical interaction;   We discussed the use of AI scribe software for clinical note transcription with the patient, who gave verbal consent to proceed. additional Info: This encounter employed state-of-the-art, real-time, collaborative documentation. The patient actively reviewed and assisted in updating their electronic medical record on a shared screen, ensuring transparency and facilitating joint problem-solving for the problem list, overview, and plan. This approach promotes accurate, informed care. The treatment plan was discussed and reviewed in detail, including medication safety, potential side effects, and all patient questions. We confirmed understanding and comfort with the plan. Follow-up instructions were established, including contacting the office for any concerns, returning if symptoms worsen, persist, or new symptoms develop, and precautions for potential emergency department visits.

## 2024-05-24 NOTE — Assessment & Plan Note (Signed)
 Noted- likely related with Revlimid  and or multiple myeloma

## 2024-05-24 NOTE — Assessment & Plan Note (Signed)
 A language barrier has affected healthcare access, including a missed ophthalmology referral. Interpreter assistance is beneficial. Utilize interpreter services for scheduling the ophthalmology appointment. Rearrange ophthalmology appointment.

## 2024-05-24 NOTE — Assessment & Plan Note (Signed)
 Potassium levels are managed with supplements, and he reports no muscle cramps, indicating effective management. Continue potassium supplementation and reassess levels during the next blood work.

## 2024-05-25 ENCOUNTER — Other Ambulatory Visit: Payer: Self-pay

## 2024-05-25 MED ORDER — KERENDIA 10 MG PO TABS: 10.0000 mg | ORAL_TABLET | Freq: Every day | ORAL | 1 refills | Status: DC

## 2024-05-31 ENCOUNTER — Other Ambulatory Visit: Payer: Self-pay

## 2024-06-01 NOTE — Progress Notes (Signed)
 HEMATOLOGY/ONCOLOGY CLINIC NOTE  Date of Service: 06/03/2024  Chief complaint - Follow-up for continued evaluation and management of high risk multiple myeloma  Current treatment-daratumumab /Revlimid /dexamethasone  Has not f/u on and does not appear keen to consider Melphalan/HDT-AUTOHSCT  INTERVAL HISTORY:  Donald Suarez is a 58 y.o. male is here for continued evaluation and management of his multiple myeloma. Patient was last seen by me on 04/29/2024 and was doing well overall with no new medical complaints.   Today, he presents for toxicity check prior to receiving cycle 21 day 16 of treatment.   Patient is accompanied by an interpretor during today's visit.   He reports no new concerns since his last clinical visit. Patient denies any diarrhea, cramping, infection issues, new bone pain, headache, lightheadedness, dizziness, abdominal pain,   He reports that he regularly stays well-hydrated and has been eating well.   Patient denies any travel plans for the summer.   MEDICAL HISTORY:  Past Medical History:  Diagnosis Date   Abfraction 08/06/2022   Accretions on teeth 08/06/2022   Anemia    Attrition, teeth excessive 08/06/2022   Caries 08/06/2022   Defective dental restoration 08/06/2022   Elevated ferritin 08/07/2022   Hypocalcemia 08/19/2022   In setting of multiple myeloma    Loose, teeth 08/06/2022   Malocclusion 08/06/2022   Microalbuminuria 08/19/2022   Periodontal disease 08/06/2022   Peripheral neuropathy 09/08/2022   Started 08/2022 A/w diabetes and revlimid  usage Burning on top of left foot   Teeth missing 08/06/2022    SURGICAL HISTORY: Past Surgical History:  Procedure Laterality Date   APPENDECTOMY     [place] BONE MARROW BIOPSY & ASPIRATION  02/11/2023   [place] FLUORO GUIDE CV LINE RIGHT  05/15/2022   [place] PATIENT EVAL TECH 0-60 MINS  05/19/2022   [place] US  GUIDE VASC ACCESS RIGHT  05/15/2022    SOCIAL HISTORY: Social History   Socioeconomic History   Marital status:  Married    Spouse name: Not on file   Number of children: Not on file   Years of education: Not on file   Highest education level: Not on file  Occupational History   Not on file  Tobacco Use   Smoking status: Never   Smokeless tobacco: Never  Vaping Use   Vaping status: Never Used  Substance and Sexual Activity   Alcohol use: Never   Drug use: Never   Sexual activity: Not on file  Other Topics Concern   Not on file  Social History Narrative   Not on file   Social Drivers of Health   Financial Resource Strain: Medium Risk (05/21/2022)   Overall Financial Resource Strain (CARDIA)    Difficulty of Paying Living Expenses: Somewhat hard  Food Insecurity: Food Insecurity Present (05/21/2022)   Hunger Vital Sign    Worried About Running Out of Food in the Last Year: Sometimes true    Donald Out of Food in the Last Year: Sometimes true  Transportation Needs: No Transportation Needs (05/21/2022)   PRAPARE - Administrator, Civil Service (Medical): No    Lack of Transportation (Non-Medical): No  Physical Activity: Not on file  Stress: Not on file  Social Connections: Not on file  Intimate Partner Violence: Not on file    FAMILY HISTORY: No family history on file.  ALLERGIES:  has no known allergies.  MEDICATIONS:  . Allergies as of 06/03/2024   No Known Allergies      Medication List  Accurate as of June 03, 2024 11:59 PM. If you have any questions, ask your nurse or doctor.          Accu-Chek Guide test strip Generic drug: glucose blood Use to test blood sugars up to 4 times daily as directed.   Accu-Chek Guide w/Device Kit Use to test blood sugars up to 4 times daily as directed.   Accu-Chek Softclix Lancets lancets Use to test blood sugars up to 4 times daily as directed.   acyclovir  400 MG tablet Commonly known as: ZOVIRAX  Take 1 tablet (400 mg total) by mouth 2 (two) times daily.   aspirin  EC 81 MG tablet Take 1 tablet (81 mg total) by  mouth daily. Swallow whole.   B-12 1000 MCG Caps Take 1 tablet by mouth daily.   dapagliflozin  propanediol 10 MG Tabs tablet Commonly known as: Farxiga  Take 1 tablet (10 mg total) by mouth daily before breakfast. sa asar sa hroi   Dexcom G7 Receiver Devi 1 Device by Does not apply route continuous.   Dexcom G7 Sensor Misc 1 each by Does not apply route continuous. Change sensor every ten days.   gabapentin  300 MG capsule Commonly known as: NEURONTIN  Take 1 capsule (300 mg total) by mouth 3 (three) times daily.   Kerendia  10 MG Tabs Generic drug: Finerenone  Take 1 tablet (10 mg total) by mouth daily at 12 noon.   lenalidomide  25 MG capsule Commonly known as: REVLIMID  TAKE 1 CAPSULE BY MOUTH 1 TIME A DAY FOR 21 DAYS ON THEN 7 DAYS OFF   losartan  25 MG tablet Commonly known as: COZAAR  Take 1 tablet (25 mg total) by mouth daily. Start taking if blood pressure is over 130/80.  Protects kidneys and lowers blood pressure.   metFORMIN  500 MG tablet Commonly known as: GLUCOPHAGE  Take 2 tablets (1,000 mg total) by mouth 2 (two) times daily with a meal. 2 asar 1 wot, 2 wot mnhum  amang 1 hroi   potassium chloride  SA 20 MEQ tablet Commonly known as: KLOR-CON  M TAKE 1 TABLET BY MOUTH TWICE A DAY   rosuvastatin  10 MG tablet Commonly known as: CRESTOR  Take 1 tablet (10 mg total) by mouth daily.   tirzepatide  10 MG/0.5ML Pen Commonly known as: MOUNJARO  Inject 10 mg into the skin once a week.         REVIEW OF SYSTEMS:    10 Point review of Systems was done is negative except as noted above.   PHYSICAL EXAMINATION: ..BP 132/81 (BP Location: Left Arm, Patient Position: Sitting)   Pulse (!) 58   Temp 97.7 F (36.5 C) (Temporal)   Resp 18   Wt 169 lb 1.6 oz (76.7 kg)   SpO2 99%   BMI 27.29 kg/m    GENERAL:alert, in no acute distress and comfortable SKIN: no acute rashes, no significant lesions EYES: conjunctiva are pink and non-injected, sclera anicteric OROPHARYNX:  MMM, no exudates, no oropharyngeal erythema or ulceration NECK: supple, no JVD LYMPH:  no palpable lymphadenopathy in the cervical, axillary or inguinal regions LUNGS: clear to auscultation b/l with normal respiratory effort HEART: regular rate & rhythm ABDOMEN:  normoactive bowel sounds , non tender, not distended. Extremity: no pedal edema PSYCH: alert & oriented x 3 with fluent speech NEURO: no focal motor/sensory deficits    LABORATORY DATA:  I have reviewed the data as listed .    Latest Ref Rng & Units 06/10/2024   12:04 PM 06/03/2024    8:41 AM 05/13/2024   12:13  PM  CBC  WBC 4.0 - 10.5 K/uL 6.5  5.6  6.2   Hemoglobin 13.0 - 17.0 g/dL 52.8  41.3  24.4   Hematocrit 39.0 - 52.0 % 39.9  40.2  41.1   Platelets 150 - 400 K/uL 154  148  208    .    Latest Ref Rng & Units 06/10/2024   12:04 PM 06/03/2024    8:41 AM 05/13/2024   12:13 PM  CMP  Glucose 70 - 99 mg/dL 010  272  536   BUN 6 - 20 mg/dL 14  14  14    Creatinine 0.61 - 1.24 mg/dL 6.44  0.34  7.42   Sodium 135 - 145 mmol/L 141  142  141   Potassium 3.5 - 5.1 mmol/L 3.1  3.3  3.2   Chloride 98 - 111 mmol/L 111  110  110   CO2 22 - 32 mmol/L 25  25  27    Calcium  8.9 - 10.3 mg/dL 8.2  8.5  8.8   Total Protein 6.5 - 8.1 g/dL 5.9  5.8  5.9   Total Bilirubin 0.0 - 1.2 mg/dL 0.7  0.7  0.6   Alkaline Phos 38 - 126 U/L 56  44  46   AST 15 - 41 U/L 17  16  14    ALT 0 - 44 U/L 27  24  14     . Lab Results  Component Value Date   LDH 114 10/10/2022      Bone marrow biopsy 02/11/2023   RADIOGRAPHIC STUDIES: I have personally reviewed the radiological images as listed and agreed with the findings in the report. No results found.  ASSESSMENT & PLAN:   58 y.o. 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  Patient diagnosed December 2021 and received 1 cycle of CyBorD. Had previously presented with anemia renal insufficiency  and hyperviscosity symptoms. Bx- 90% plasma cells Initial M spike 8.03g/dl VZD(6L): Not Detected  Dup(1q): Not Detected  Gains(15): DETECTED  Gains(5 and 9): Not Detected  Del(13q)/-13: DETECTED  Del(17p)(TP53): Not Detected  IGH(Rearrangement): SEE BELOW   IgH complex: t(4;14): DETECTED  t(11;14): Not Detected  t(14;16): Not Detected  t(14;20): Not Detected   Cytogenetics: Normal male karyotype.   -Labs from 05/13/2022-beta-2  microglobulin 4.4, LDH 197, lambda free light chain 77.1, kappa, lambda light chain ratio 0.08, multiple myeloma panel pending.  UPEP ordered but not yet collected. -Labs from 05/14/2022-IgG 10,816, IgA 13, IgM 6, viscosity pending -Bone survey from 05/14/2022- Small rounded lucencies are noted in the skull, proximal right humerus and proximal right femur concerning for multiple myeloma.   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  Due to multiple myeloma   PLAN  -Discussed lab results on 06/03/2024 in detail with patient. CBC stable, showed WBC of 5.6K, hemoglobin of 13.7, and platelets of 148K. -his last myeloma panel showed stable M protein at 0.2 g/dL -he has tolerated his last treatment with no new or major toxicities -patient has been tolerating Revlimid  and daratumumab  with no significant or new toxicity issues  -continue daratumumab  every 2 weeks -- switching to Dara faspro. Switched premeds to po . -Continue monthly  25 mg Revlimid  po 3 weeks on 1 week off  -patient is appropriate to proceed with cycle 21 day 16 of his treatment -discussed that his type of cancer is not curable but can be inactive for many years -discussed that he would continue his treatment as long as it is effective,  he continues to tolerate, and he chooses to continue it -recommend staying well-hydrated with 2-3L of water daily  -continue to stay physically active -continue sun protection while outdoors -answered all of patient's questions in detail  Follow-up Per integrated scheduling  The total time spent in the appointment was 30 minutes* .  All of the patient's questions were answered with apparent satisfaction. The patient knows to call the clinic with any problems, questions or concerns.   Jacquelyn Matt MD MS AAHIVMS [facility] Hematology/Oncology Physician [facility]  .*Total Encounter Time as defined by the Centers for Medicare and Medicaid Services includes, in addition to the face-to-face time of a patient visit (documented in the note above) non-face-to-face time: obtaining and reviewing outside history, ordering and reviewing medications, tests or procedures, care coordination (communications with other health care professionals or caregivers) and documentation in the medical record.    I,Mitra Faeizi,acting as a Neurosurgeon for Jacquelyn Matt, MD.,have documented all relevant documentation on the behalf of Jacquelyn Matt, MD,as directed by  Jacquelyn Matt, MD while in the presence of Jacquelyn Matt, MD.  .I have reviewed the above documentation for accuracy and completeness, and I agree with the above. .Lizzie An Kishore Briceida Rasberry MD

## 2024-06-03 ENCOUNTER — Inpatient Hospital Stay

## 2024-06-03 ENCOUNTER — Inpatient Hospital Stay: Attending: Hematology

## 2024-06-03 ENCOUNTER — Inpatient Hospital Stay: Admitting: Hematology

## 2024-06-03 VITALS — BP 132/68 | HR 72 | Temp 98.2°F | Resp 18

## 2024-06-03 VITALS — BP 132/81 | HR 58 | Temp 97.7°F | Resp 18 | Wt 169.1 lb

## 2024-06-03 DIAGNOSIS — C9001 Multiple myeloma in remission: Secondary | ICD-10-CM

## 2024-06-03 DIAGNOSIS — D630 Anemia in neoplastic disease: Secondary | ICD-10-CM | POA: Insufficient documentation

## 2024-06-03 DIAGNOSIS — E1142 Type 2 diabetes mellitus with diabetic polyneuropathy: Secondary | ICD-10-CM | POA: Diagnosis not present

## 2024-06-03 DIAGNOSIS — K030 Excessive attrition of teeth: Secondary | ICD-10-CM | POA: Insufficient documentation

## 2024-06-03 DIAGNOSIS — K0850 Unsatisfactory restoration of tooth, unspecified: Secondary | ICD-10-CM | POA: Insufficient documentation

## 2024-06-03 DIAGNOSIS — Z7984 Long term (current) use of oral hypoglycemic drugs: Secondary | ICD-10-CM | POA: Diagnosis not present

## 2024-06-03 DIAGNOSIS — C9000 Multiple myeloma not having achieved remission: Secondary | ICD-10-CM

## 2024-06-03 DIAGNOSIS — Z7189 Other specified counseling: Secondary | ICD-10-CM

## 2024-06-03 DIAGNOSIS — Z7985 Long-term (current) use of injectable non-insulin antidiabetic drugs: Secondary | ICD-10-CM | POA: Diagnosis not present

## 2024-06-03 DIAGNOSIS — Z5111 Encounter for antineoplastic chemotherapy: Secondary | ICD-10-CM | POA: Diagnosis not present

## 2024-06-03 DIAGNOSIS — D696 Thrombocytopenia, unspecified: Secondary | ICD-10-CM | POA: Insufficient documentation

## 2024-06-03 DIAGNOSIS — K056 Periodontal disease, unspecified: Secondary | ICD-10-CM | POA: Insufficient documentation

## 2024-06-03 DIAGNOSIS — Z5112 Encounter for antineoplastic immunotherapy: Secondary | ICD-10-CM | POA: Insufficient documentation

## 2024-06-03 LAB — CBC WITH DIFFERENTIAL (CANCER CENTER ONLY)
Abs Immature Granulocytes: 0.01 10*3/uL (ref 0.00–0.07)
Basophils Absolute: 0.1 10*3/uL (ref 0.0–0.1)
Basophils Relative: 1 %
Eosinophils Absolute: 0.5 10*3/uL (ref 0.0–0.5)
Eosinophils Relative: 10 %
HCT: 40.2 % (ref 39.0–52.0)
Hemoglobin: 13.7 g/dL (ref 13.0–17.0)
Immature Granulocytes: 0 %
Lymphocytes Relative: 43 %
Lymphs Abs: 2.4 10*3/uL (ref 0.7–4.0)
MCH: 26.6 pg (ref 26.0–34.0)
MCHC: 34.1 g/dL (ref 30.0–36.0)
MCV: 78.1 fL — ABNORMAL LOW (ref 80.0–100.0)
Monocytes Absolute: 0.7 10*3/uL (ref 0.1–1.0)
Monocytes Relative: 12 %
Neutro Abs: 1.9 10*3/uL (ref 1.7–7.7)
Neutrophils Relative %: 34 %
Platelet Count: 148 10*3/uL — ABNORMAL LOW (ref 150–400)
RBC: 5.15 MIL/uL (ref 4.22–5.81)
RDW: 15.8 % — ABNORMAL HIGH (ref 11.5–15.5)
WBC Count: 5.6 10*3/uL (ref 4.0–10.5)
nRBC: 0 % (ref 0.0–0.2)

## 2024-06-03 LAB — COMPREHENSIVE METABOLIC PANEL WITH GFR
ALT: 24 U/L (ref 0–44)
AST: 16 U/L (ref 15–41)
Albumin: 3.7 g/dL (ref 3.5–5.0)
Alkaline Phosphatase: 44 U/L (ref 38–126)
Anion gap: 7 (ref 5–15)
BUN: 14 mg/dL (ref 6–20)
CO2: 25 mmol/L (ref 22–32)
Calcium: 8.5 mg/dL — ABNORMAL LOW (ref 8.9–10.3)
Chloride: 110 mmol/L (ref 98–111)
Creatinine, Ser: 0.8 mg/dL (ref 0.61–1.24)
GFR, Estimated: >60 mL/min (ref >60)
Glucose, Bld: 147 mg/dL — ABNORMAL HIGH (ref 70–99)
Potassium: 3.3 mmol/L — ABNORMAL LOW (ref 3.5–5.1)
Sodium: 142 mmol/L (ref 135–145)
Total Bilirubin: 0.7 mg/dL (ref 0.0–1.2)
Total Protein: 5.8 g/dL — ABNORMAL LOW (ref 6.5–8.1)

## 2024-06-03 MED ORDER — ZOLEDRONIC ACID 4 MG/100ML IV SOLN
4.0000 mg | Freq: Once | INTRAVENOUS | Status: AC
  Administered 2024-06-03: 4 mg via INTRAVENOUS
  Filled 2024-06-03: qty 100

## 2024-06-03 MED ORDER — ACETAMINOPHEN 325 MG PO TABS
650.0000 mg | ORAL_TABLET | Freq: Once | ORAL | Status: AC
  Administered 2024-06-03: 650 mg via ORAL
  Filled 2024-06-03: qty 2

## 2024-06-03 MED ORDER — SODIUM CHLORIDE 0.9 % IV SOLN: Freq: Once | INTRAVENOUS | Status: AC

## 2024-06-03 MED ORDER — FAMOTIDINE IN NACL 20-0.9 MG/50ML-% IV SOLN
20.0000 mg | Freq: Once | INTRAVENOUS | Status: AC
  Administered 2024-06-03: 20 mg via INTRAVENOUS
  Filled 2024-06-03: qty 50

## 2024-06-03 MED ORDER — DEXAMETHASONE SODIUM PHOSPHATE 10 MG/ML IJ SOLN
2.0000 mg | Freq: Once | INTRAMUSCULAR | Status: AC
  Administered 2024-06-03: 2 mg via INTRAVENOUS
  Filled 2024-06-03: qty 1

## 2024-06-03 MED ORDER — SODIUM CHLORIDE 0.9 % IV SOLN
16.0000 mg/kg | Freq: Once | INTRAVENOUS | Status: AC
  Administered 2024-06-03: 1300 mg via INTRAVENOUS
  Filled 2024-06-03: qty 5

## 2024-06-03 MED ORDER — MONTELUKAST SODIUM 10 MG PO TABS
10.0000 mg | ORAL_TABLET | Freq: Once | ORAL | Status: AC
  Administered 2024-06-03: 10 mg via ORAL
  Filled 2024-06-03: qty 1

## 2024-06-03 MED ORDER — DIPHENHYDRAMINE HCL 25 MG PO CAPS
50.0000 mg | ORAL_CAPSULE | Freq: Once | ORAL | Status: AC
  Administered 2024-06-03: 50 mg via ORAL
  Filled 2024-06-03: qty 2

## 2024-06-03 NOTE — Patient Instructions (Signed)
 [facility] - A DEPT OF [facility]  Discharge Instructions: Thank you for choosing [facility] to provide your oncology and hematology care.   If you have a lab appointment with the Cancer Center, please go directly to the Cancer Center and check in at the registration area.   Wear comfortable clothing and clothing appropriate for easy access to any Portacath or PICC line.   We strive to give you quality time with your provider. You may need to reschedule your appointment if you arrive late (15 or more minutes).  Arriving late affects you and other patients whose appointments are after yours.  Also, if you miss three or more appointments without notifying the office, you may be dismissed from the clinic at the provider's discretion.      For prescription refill requests, have your pharmacy contact our office and allow 72 hours for refills to be completed.    Today you received the following chemotherapy and/or immunotherapy agents Darzalex      To help prevent nausea and vomiting after your treatment, we encourage you to take your nausea medication as directed.  BELOW ARE SYMPTOMS THAT SHOULD BE REPORTED IMMEDIATELY: *FEVER GREATER THAN 100.4 F (38 C) OR HIGHER *CHILLS OR SWEATING *NAUSEA AND VOMITING THAT IS NOT CONTROLLED WITH YOUR NAUSEA MEDICATION *UNUSUAL SHORTNESS OF BREATH *UNUSUAL BRUISING OR BLEEDING *URINARY PROBLEMS (pain or burning when urinating, or frequent urination) *BOWEL PROBLEMS (unusual diarrhea, constipation, pain near the anus) TENDERNESS IN MOUTH AND THROAT WITH OR WITHOUT PRESENCE OF ULCERS (sore throat, sores in mouth, or a toothache) UNUSUAL RASH, SWELLING OR PAIN  UNUSUAL VAGINAL DISCHARGE OR ITCHING   Items with * indicate a potential emergency and should be followed up as soon as possible or go to the Emergency Department if any problems should occur.  Please show the CHEMOTHERAPY ALERT CARD or IMMUNOTHERAPY  ALERT CARD at check-in to the Emergency Department and triage nurse.  Should you have questions after your visit or need to cancel or reschedule your appointment, please contact [facility] - A DEPT OF [facility]  Dept: [phone]  and follow the prompts.  Office hours are 8:00 a.m. to 4:30 p.m. Monday - Friday. Please note that voicemails left after 4:00 p.m. may not be returned until the following business day.  We are closed weekends and major holidays. You have access to a nurse at all times for urgent questions. Please call the main number to the clinic Dept: [phone] and follow the prompts.   For any non-urgent questions, you may also contact your provider using MyChart. We now offer e-Visits for anyone 20 and older to request care online for non-urgent symptoms. For details visit [email or web address].   Also download the MyChart app! Go to the app store, search "MyChart", open the app, select [place], and log in with your MyChart username and password.

## 2024-06-03 NOTE — Progress Notes (Signed)
 Pt given Revlimid  on 06/03/24. It is delivered to the [facility]. Pt to start taking this cycle on 06/11/24 - 07/02/24. Pt will take off 07/03/24 - 07/09/24. New cycle will start on 07/10/24. Pt aware when to start this medication.

## 2024-06-06 LAB — MULTIPLE MYELOMA PANEL, SERUM
Albumin SerPl Elph-Mcnc: 3.4 g/dL (ref 2.9–4.4)
Albumin/Glob SerPl: 1.6 (ref 0.7–1.7)
Alpha 1: 0.1 g/dL (ref 0.0–0.4)
Alpha2 Glob SerPl Elph-Mcnc: 0.7 g/dL (ref 0.4–1.0)
B-Globulin SerPl Elph-Mcnc: 0.8 g/dL (ref 0.7–1.3)
Gamma Glob SerPl Elph-Mcnc: 0.6 g/dL (ref 0.4–1.8)
Globulin, Total: 2.2 g/dL (ref 2.2–3.9)
IgA: 127 mg/dL (ref 90–386)
IgG (Immunoglobin G), Serum: 579 mg/dL — ABNORMAL LOW (ref 603–1613)
IgM (Immunoglobulin M), Srm: 15 mg/dL — ABNORMAL LOW (ref 20–172)
M Protein SerPl Elph-Mcnc: 0.3 g/dL — ABNORMAL HIGH
Total Protein ELP: 5.6 g/dL — ABNORMAL LOW (ref 6.0–8.5)

## 2024-06-10 ENCOUNTER — Other Ambulatory Visit: Payer: Self-pay

## 2024-06-10 ENCOUNTER — Inpatient Hospital Stay

## 2024-06-10 VITALS — BP 133/76 | HR 64 | Temp 98.2°F | Resp 20 | Wt 166.2 lb

## 2024-06-10 DIAGNOSIS — C9001 Multiple myeloma in remission: Secondary | ICD-10-CM

## 2024-06-10 DIAGNOSIS — Z5112 Encounter for antineoplastic immunotherapy: Secondary | ICD-10-CM | POA: Diagnosis not present

## 2024-06-10 DIAGNOSIS — Z7189 Other specified counseling: Secondary | ICD-10-CM

## 2024-06-10 LAB — CBC WITH DIFFERENTIAL (CANCER CENTER ONLY)
Abs Immature Granulocytes: 0.01 10*3/uL (ref 0.00–0.07)
Basophils Absolute: 0.1 10*3/uL (ref 0.0–0.1)
Basophils Relative: 2 %
Eosinophils Absolute: 0.6 10*3/uL — ABNORMAL HIGH (ref 0.0–0.5)
Eosinophils Relative: 10 %
HCT: 39.9 % (ref 39.0–52.0)
Hemoglobin: 13.5 g/dL (ref 13.0–17.0)
Immature Granulocytes: 0 %
Lymphocytes Relative: 35 %
Lymphs Abs: 2.2 10*3/uL (ref 0.7–4.0)
MCH: 26.4 pg (ref 26.0–34.0)
MCHC: 33.8 g/dL (ref 30.0–36.0)
MCV: 77.9 fL — ABNORMAL LOW (ref 80.0–100.0)
Monocytes Absolute: 0.7 10*3/uL (ref 0.1–1.0)
Monocytes Relative: 10 %
Neutro Abs: 2.9 10*3/uL (ref 1.7–7.7)
Neutrophils Relative %: 43 %
Platelet Count: 154 10*3/uL (ref 150–400)
RBC: 5.12 MIL/uL (ref 4.22–5.81)
RDW: 15.7 % — ABNORMAL HIGH (ref 11.5–15.5)
WBC Count: 6.5 10*3/uL (ref 4.0–10.5)
nRBC: 0 % (ref 0.0–0.2)

## 2024-06-10 LAB — COMPREHENSIVE METABOLIC PANEL WITH GFR
ALT: 27 U/L (ref 0–44)
AST: 17 U/L (ref 15–41)
Albumin: 3.8 g/dL (ref 3.5–5.0)
Alkaline Phosphatase: 56 U/L (ref 38–126)
Anion gap: 5 (ref 5–15)
BUN: 14 mg/dL (ref 6–20)
CO2: 25 mmol/L (ref 22–32)
Calcium: 8.2 mg/dL — ABNORMAL LOW (ref 8.9–10.3)
Chloride: 111 mmol/L (ref 98–111)
Creatinine, Ser: 0.68 mg/dL (ref 0.61–1.24)
GFR, Estimated: >60 mL/min (ref >60)
Glucose, Bld: 153 mg/dL — ABNORMAL HIGH (ref 70–99)
Potassium: 3.1 mmol/L — ABNORMAL LOW (ref 3.5–5.1)
Sodium: 141 mmol/L (ref 135–145)
Total Bilirubin: 0.7 mg/dL (ref 0.0–1.2)
Total Protein: 5.9 g/dL — ABNORMAL LOW (ref 6.5–8.1)

## 2024-06-10 MED ORDER — SODIUM CHLORIDE 0.9 % IV SOLN: Freq: Once | INTRAVENOUS | Status: AC

## 2024-06-10 MED ORDER — SODIUM CHLORIDE 0.9 % IV SOLN: 16.0000 mg/kg | Freq: Once | INTRAVENOUS | Status: DC

## 2024-06-10 MED ORDER — DEXAMETHASONE SODIUM PHOSPHATE 10 MG/ML IJ SOLN: 2.0000 mg | Freq: Once | INTRAMUSCULAR | Status: DC

## 2024-06-10 MED ORDER — FAMOTIDINE IN NACL 20-0.9 MG/50ML-% IV SOLN: 20.0000 mg | Freq: Once | INTRAVENOUS | Status: DC

## 2024-06-10 MED ORDER — DIPHENHYDRAMINE HCL 25 MG PO CAPS: 50.0000 mg | ORAL_CAPSULE | Freq: Once | ORAL | Status: DC

## 2024-06-10 MED ORDER — ACETAMINOPHEN 325 MG PO TABS: 650.0000 mg | ORAL_TABLET | Freq: Once | ORAL | Status: DC

## 2024-06-10 MED ORDER — MONTELUKAST SODIUM 10 MG PO TABS: 10.0000 mg | ORAL_TABLET | Freq: Once | ORAL | Status: DC

## 2024-06-11 ENCOUNTER — Encounter: Payer: Self-pay | Admitting: Hematology

## 2024-06-11 ENCOUNTER — Other Ambulatory Visit: Payer: Self-pay

## 2024-06-13 LAB — MULTIPLE MYELOMA PANEL, SERUM
Albumin SerPl Elph-Mcnc: 3.2 g/dL (ref 2.9–4.4)
Albumin/Glob SerPl: 1.4 (ref 0.7–1.7)
Alpha 1: 0.1 g/dL (ref 0.0–0.4)
Alpha2 Glob SerPl Elph-Mcnc: 0.9 g/dL (ref 0.4–1.0)
B-Globulin SerPl Elph-Mcnc: 0.9 g/dL (ref 0.7–1.3)
Gamma Glob SerPl Elph-Mcnc: 0.6 g/dL (ref 0.4–1.8)
Globulin, Total: 2.4 g/dL (ref 2.2–3.9)
IgA: 133 mg/dL (ref 90–386)
IgG (Immunoglobin G), Serum: 570 mg/dL — ABNORMAL LOW (ref 603–1613)
IgM (Immunoglobulin M), Srm: 15 mg/dL — ABNORMAL LOW (ref 20–172)
M Protein SerPl Elph-Mcnc: 0.2 g/dL — ABNORMAL HIGH
Total Protein ELP: 5.6 g/dL — ABNORMAL LOW (ref 6.0–8.5)

## 2024-06-14 ENCOUNTER — Other Ambulatory Visit: Payer: Self-pay

## 2024-06-17 ENCOUNTER — Inpatient Hospital Stay

## 2024-06-17 VITALS — BP 128/78 | HR 72 | Temp 98.2°F | Resp 18

## 2024-06-17 DIAGNOSIS — Z7189 Other specified counseling: Secondary | ICD-10-CM

## 2024-06-17 DIAGNOSIS — Z5112 Encounter for antineoplastic immunotherapy: Secondary | ICD-10-CM | POA: Diagnosis not present

## 2024-06-17 DIAGNOSIS — C9001 Multiple myeloma in remission: Secondary | ICD-10-CM

## 2024-06-17 MED ORDER — MONTELUKAST SODIUM 10 MG PO TABS
10.0000 mg | ORAL_TABLET | Freq: Once | ORAL | Status: AC
  Administered 2024-06-17: 10 mg via ORAL
  Filled 2024-06-17: qty 1

## 2024-06-17 MED ORDER — DARATUMUMAB-HYALURONIDASE-FIHJ 1800-30000 MG-UT/15ML ~~LOC~~ SOLN
1800.0000 mg | Freq: Once | SUBCUTANEOUS | Status: AC
  Administered 2024-06-17: 1800 mg via SUBCUTANEOUS
  Filled 2024-06-17: qty 15

## 2024-06-17 MED ORDER — DIPHENHYDRAMINE HCL 25 MG PO CAPS
50.0000 mg | ORAL_CAPSULE | Freq: Once | ORAL | Status: AC
  Administered 2024-06-17: 50 mg via ORAL
  Filled 2024-06-17: qty 2

## 2024-06-17 MED ORDER — ACETAMINOPHEN 325 MG PO TABS
650.0000 mg | ORAL_TABLET | Freq: Once | ORAL | Status: AC
  Administered 2024-06-17: 650 mg via ORAL
  Filled 2024-06-17: qty 2

## 2024-06-17 MED ORDER — DEXAMETHASONE 4 MG PO TABS
2.0000 mg | ORAL_TABLET | Freq: Once | ORAL | Status: AC
  Administered 2024-06-17: 2 mg via ORAL
  Filled 2024-06-17: qty 1

## 2024-06-17 MED ORDER — FAMOTIDINE 20 MG PO TABS
20.0000 mg | ORAL_TABLET | Freq: Once | ORAL | Status: AC
  Administered 2024-06-17: 20 mg via ORAL
  Filled 2024-06-17: qty 1

## 2024-06-22 ENCOUNTER — Other Ambulatory Visit: Payer: Self-pay | Admitting: Hematology

## 2024-06-22 ENCOUNTER — Telehealth: Payer: Self-pay | Admitting: Internal Medicine

## 2024-06-22 DIAGNOSIS — C9000 Multiple myeloma not having achieved remission: Secondary | ICD-10-CM

## 2024-06-22 NOTE — Telephone Encounter (Signed)
 Finerenone  (KERENDIA ) 10 MG TABS   Pt needs a PA for above medication.

## 2024-06-23 ENCOUNTER — Telehealth: Payer: Self-pay

## 2024-06-23 ENCOUNTER — Other Ambulatory Visit (HOSPITAL_COMMUNITY): Payer: Self-pay

## 2024-06-23 ENCOUNTER — Encounter: Payer: Self-pay | Admitting: Hematology

## 2024-06-23 NOTE — Telephone Encounter (Signed)
 Pharmacy Patient Advocate Encounter  Received notification from [facility] that Prior Authorization for KERENDIA  10MG  TABS  has been APPROVED from 06/23/24 to 06/23/25. Ran test claim, Copay is $0. This test claim was processed through [facility]- copay amounts may vary at other pharmacies due to pharmacy/plan contracts, or as the patient moves through the different stages of their insurance plan.   PA #/Case ID/Reference #: [id number]

## 2024-06-23 NOTE — Telephone Encounter (Signed)
 Pharmacy Patient Advocate Encounter   Received notification from RX Request Messages that prior authorization for KERENDIA  10MG  TABS is required/requested.   Insurance verification completed.   The patient is insured through [facility] .   Per test claim: PA required; PA submitted to above mentioned insurance via CoverMyMeds Key/confirmation #/[id number] Status is pending

## 2024-06-24 ENCOUNTER — Encounter: Payer: Self-pay | Admitting: Hematology

## 2024-06-24 ENCOUNTER — Ambulatory Visit (INDEPENDENT_AMBULATORY_CARE_PROVIDER_SITE_OTHER): Admitting: Internal Medicine

## 2024-06-24 DIAGNOSIS — R809 Proteinuria, unspecified: Secondary | ICD-10-CM | POA: Diagnosis not present

## 2024-06-24 DIAGNOSIS — E876 Hypokalemia: Secondary | ICD-10-CM | POA: Diagnosis not present

## 2024-06-24 DIAGNOSIS — E559 Vitamin D deficiency, unspecified: Secondary | ICD-10-CM

## 2024-06-24 DIAGNOSIS — Z603 Acculturation difficulty: Secondary | ICD-10-CM

## 2024-06-24 DIAGNOSIS — E1165 Type 2 diabetes mellitus with hyperglycemia: Secondary | ICD-10-CM

## 2024-06-24 DIAGNOSIS — Z758 Other problems related to medical facilities and other health care: Secondary | ICD-10-CM

## 2024-06-24 DIAGNOSIS — C9001 Multiple myeloma in remission: Secondary | ICD-10-CM

## 2024-06-24 DIAGNOSIS — Z7984 Long term (current) use of oral hypoglycemic drugs: Secondary | ICD-10-CM

## 2024-06-24 DIAGNOSIS — Z7985 Long-term (current) use of injectable non-insulin antidiabetic drugs: Secondary | ICD-10-CM

## 2024-06-24 LAB — MICROALBUMIN / CREATININE URINE RATIO
Creatinine,U: 79 mg/dL
Microalb Creat Ratio: UNDETERMINED mg/g (ref 0.0–30.0)
Microalb, Ur: <0.7 mg/dL

## 2024-06-24 LAB — HEMOGLOBIN A1C: Hgb A1c MFr Bld: 7.1 % — ABNORMAL HIGH (ref 4.6–6.5)

## 2024-06-24 LAB — VITAMIN D 25 HYDROXY (VIT D DEFICIENCY, FRACTURES): VITD: 16.19 ng/mL — ABNORMAL LOW (ref 30.00–100.00)

## 2024-06-24 LAB — MAGNESIUM: Magnesium: 1.8 mg/dL (ref 1.5–2.5)

## 2024-06-24 MED ORDER — POTASSIUM CHLORIDE CRYS ER 20 MEQ PO TBCR: 20.0000 meq | EXTENDED_RELEASE_TABLET | Freq: Every day | ORAL | 3 refills | Status: AC

## 2024-06-24 MED ORDER — VITAMIN D3 125 MCG (5000 UT) PO CAPS: 5000.0000 [IU] | ORAL_CAPSULE | Freq: Every day | ORAL | 3 refills | Status: AC

## 2024-06-24 MED ORDER — VITAMIN K2 100 MCG PO CAPS
1.0000 | ORAL_CAPSULE | Freq: Every day | ORAL | 3 refills | Status: AC
Start: 2024-06-24 — End: ?

## 2024-06-24 NOTE — Patient Instructions (Signed)
 It was a pleasure seeing you today! Your health and satisfaction are our top priorities.  Donald Cone, MD  Your Providers PCP: [name], MD,  [phone]) Referring Provider: [name], MD,  [phone]) Care Team Provider: [name], MD,  [phone])     NEXT STEPS: [x]  Early Intervention: Schedule sooner appointment, call our on-call services, or go to emergency room if there is any significant Increase in pain or discomfort New or worsening symptoms Sudden or severe changes in your health [x]  Flexible Follow-Up: We recommend a No follow-ups on file. for optimal routine care. This allows for progress monitoring and treatment adjustments. [x]  Preventive Care: Schedule your annual preventive care visit! It's typically covered by insurance and helps identify potential health issues early. [x]  Lab & X-ray Appointments: Incomplete tests scheduled today, or call to schedule. X-rays: [facility] (M-F, 8:30am-noon or 1pm-5pm). [x]  Medical Information Release: Sign a release form at front desk to obtain relevant medical information we don't have.  MAKING THE MOST OF OUR FOCUSED 20 MINUTE APPOINTMENTS: [x]   Clearly state your top concerns at the beginning of the visit to focus our discussion [x]   If you anticipate you will need more time, please inform the front desk during scheduling - we can book multiple appointments in the same week. [x]   If you have transportation problems- use our convenient video appointments or ask about transportation support. [x]   We can get down to business faster if you use MyChart to update information before the visit and submit non-urgent questions before your visit. Thank you for taking the time to provide details through MyChart.  Let our nurse know and she can import this information into your encounter documents.  Arrival and Wait Times: [x]   Arriving on time ensures that everyone receives prompt  attention. [x]   Early morning (8a) and afternoon (1p) appointments tend to have shortest wait times. [x]   Unfortunately, we cannot delay appointments for late arrivals or hold slots during phone calls.  Getting Answers and Following Up [x]   Simple Questions & Concerns: For quick questions or basic follow-up after your visit, reach us  at [phone] or MyChart messaging. [x]   Complex Concerns: If your concern is more complex, scheduling an appointment might be best. Discuss this with the staff to find the most suitable option. [x]   Lab & Imaging Results: We'll contact you directly if results are abnormal or you don't use MyChart. Most normal results will be on MyChart within 2-3 business days, with a review message from Dr. Cone. Haven't heard back in 2 weeks? Need results sooner? Contact us  at [phone]. [x]   Referrals: Our referral coordinator will manage specialist referrals. The specialist's office should contact you within 2 weeks to schedule an appointment. Call us  if you haven't heard from them after 2 weeks.  Staying Connected [x]   MyChart: Activate your MyChart for the fastest way to access results and message us . See the last page of this paperwork for instructions on how to activate.  Bring to Your Next Appointment [x]   Medications: Please bring all your medication bottles to your next appointment to ensure we have an accurate record of your prescriptions. [x]   Health Diaries: If you're monitoring any health conditions at home, keeping a diary of your readings can be very helpful for discussions at your next appointment.  Billing [x]   X-ray & Lab Orders: These are billed by separate companies. Contact the invoicing company directly for questions or concerns. [x]   Visit  Charges: Discuss any billing inquiries with our administrative services team.  Your Satisfaction Matters [x]   Share Your Experience: We strive for your satisfaction! If you have any complaints, or preferably  compliments, please let Dr. Jesus know directly or contact our Practice Administrators, [name] or [employer], by asking at the front desk.   Reviewing Your Records [x]   Review this early draft of your clinical encounter notes below and the final encounter summary tomorrow on MyChart after its been completed.  All orders placed so far are visible here: Hypocalcemia -     Vitamin K2; Take 1 capsule by mouth daily at 6 (six) AM.  Dispense: 90 capsule; Refill: 3 -     Vitamin D3; Take 1 capsule (5,000 Units total) by mouth daily.  Dispense: 90 capsule; Refill: 3 -     VITAMIN D  25 Hydroxy (Vit-D Deficiency, Fractures)  Hypokalemia -     Potassium Chloride  Crys ER; Take 1 tablet (20 mEq total) by mouth daily.  Dispense: 30 tablet; Refill: 3 -     Magnesium -     Microalbumin / creatinine urine ratio  Type 2 diabetes mellitus with hyperglycemia, without long-term current use of insulin  (HCC) -     Hemoglobin A1c

## 2024-06-24 NOTE — Progress Notes (Unsigned)
 ==============================  [facility]: [id number]   -- Medical Office Visit --  Patient: Donald Suarez      Age: 58 y.o.       Sex:  male  Date:   06/24/2024 Today's Healthcare Provider: Bernardino KANDICE Cone, MD  ==============================   Chief Complaint: No chief complaint on file.    Discussed the use of AI scribe software for clinical note transcription with the patient, who gave verbal consent to proceed.  History of Present Illness   Lab Results  Component Value Date   MICROALBUR 2.7 (H) 05/24/2024   MICROALBUR 12.2 (H) 12/09/2023   MICROALBUR 1.2 09/09/2023   MICROALBUR <0.7 06/09/2023   MICROALBUR 2.9 (H) 08/12/2022   Lab Results  Component Value Date   GFR 100.57 12/09/2023   GFR 102.43 09/09/2023   GFR 103.96 06/09/2023   GFR 108.05 08/12/2022         {{No specialty comments available.:1}{ Problem List as of 06/24/2024 Reviewed: 05/24/2024  7:53 PM by Cone Bernardino KANDICE, MD      Active Problems   At high risk for venous thromboembolism   Last Assessment & Plan 02/22/2024 Office Visit Written 02/22/2024  1:24 PM by Cone Bernardino KANDICE, MD   Risk of Thromboembolism There is an increased risk of thromboembolism due to multiple myeloma. Advised to take aspirin  81 mg daily to reduce the risk of blood clots and prevent heart attacks. Start aspirin  81 mg daily.      Blurry vision   Last Assessment & Plan 05/24/2024 Office Visit Written 05/24/2024  7:53 PM by Cone Bernardino KANDICE, MD  Blurry vision persists for reading, though distance vision is clear. He has not seen an ophthalmologist, possibly due to a lost referral. Refer to an ophthalmologist for evaluation.      Chronic apical periodontitis   Chronic kidney disease   Last Assessment & Plan 02/22/2024 Office Visit Written 02/22/2024  1:24 PM by Cone Bernardino KANDICE, MD  Normal gfr, just microalbuminuria, due to Myeloma and diabetes with proteinuria, improved with  jardiance Nephrologist: gfr good, not yet needed  Medicines:     ARB/ACEI:   adding losartan     SGLT2 inhibitor:   jardiance  Imaging:  Labwork:  Creatinine (mg/dL)  Date Value  97/85/7974 0.85  04/10/2023 0.78  10/10/2022 0.56 (L)  09/11/2022 0.64  08/15/2022 0.55 (L)  08/08/2022 0.70  08/01/2022 0.77  07/25/2022 0.63  07/18/2022 0.67  07/11/2022 0.59 (L)  06/27/2022 0.58 (L)  06/20/2022 0.62  06/13/2022 0.75  06/06/2022 0.70  05/21/2022 0.86  01/11/2021 0.82  01/04/2021 1.12  01/01/2021 1.06   Creatinine, Ser (mg/dL)  Date Value  98/82/7974 0.79  12/18/2023 0.69  12/09/2023 0.75  11/20/2023 0.77  10/23/2023 0.76  09/25/2023 0.78  09/09/2023 0.71  08/28/2023 0.71  07/31/2023 0.65  07/03/2023 0.86  06/09/2023 0.68  06/05/2023 0.84  05/08/2023 0.73  04/24/2023 0.71  03/27/2023 0.82  03/13/2023 0.72  02/27/2023 0.72  02/13/2023 0.63  01/30/2023 0.76  01/16/2023 0.74  01/02/2023 0.69  12/19/2022 0.82  12/05/2022 0.67  11/21/2022 0.69  11/07/2022 0.64  08/12/2022 0.61  05/20/2022 0.72  05/19/2022 0.68  05/18/2022 0.88  05/18/2022 0.72  05/17/2022 0.75  05/16/2022 0.78  05/16/2022 0.86  05/16/2022 1.00  05/15/2022 1.00  05/14/2022 0.90  05/13/2022 1.02  05/13/2022 0.97   GFR (mL/min)  Date Value  12/09/2023 100.57  09/09/2023 102.43  06/09/2023 103.96  08/12/2022 108.05   No results found for: EGFR  Lab  Results  Component Value Date   VD25OH 16.61 (L) 01/01/2021   NA 138 02/12/2024   K 2.9 (L) 02/12/2024   CL 102 02/12/2024   CO2 28 02/12/2024   CALCIUM  9.1 02/12/2024   PROT 6.6 02/12/2024   ALBUMIN  4.2 02/12/2024   HGB 15.3 02/12/2024   HCT 45.6 02/12/2024   HGBA1C 8.4 (H) 12/09/2023   TOTALPROTELP 6.2 02/12/2024   Lab Results  Component Value Date   COLORURINE STRAW (A) 05/15/2022   APPEARANCEUR CLEAR 05/15/2022   LABSPEC 1.018 05/15/2022   PHURINE 6.0 05/15/2022   GLUCOSEU >=500 (A) 05/15/2022   HGBUR NEGATIVE  05/15/2022   BILIRUBINUR NEGATIVE 05/15/2022   KETONESUR NEGATIVE 05/15/2022   PROTEINUR NEGATIVE 05/15/2022   NITRITE NEGATIVE 05/15/2022   LEUKOCYTESUR NEGATIVE 05/15/2022   MICROALBUR 12.2 (H) 12/09/2023        Counseling regarding advance care planning and goals of care   Diabetes mellitus with proteinuria [facility])   Last Assessment & Plan 05/24/2024 Office Visit Written 05/24/2024  7:53 PM by Jesus Bernardino MATSU, MD  Traces of kidney damage were detected in December, possibly due to diabetes or multiple myeloma. Current medications, Farxiga  and Losartan , protect the kidneys. The goal is to prevent progression to dialysis. A urine sample will be obtained today to assess protein levels. Continue Farxiga  and Losartan , increase fluid intake during physical activity, and consider additional medication if proteinuria persists.      Elevated ferritin   Last Assessment & Plan 12/09/2023 Office Visit Written 12/09/2023  9:43 AM by Jesus Bernardino MATSU, MD  Elevated Ferritin   His previously elevated ferritin levels normalized with multiple myeloma treatment. No recent checks have been done since August 12, 2022. We will monitor ferritin levels periodically. Lab Results  Component Value Date/Time   FERRITIN 237.4 08/12/2022 08:54 AM   FERRITIN 774 (H) 05/13/2022 11:30 AM   FERRITIN 4,958 (H) 01/01/2021 12:36 PM         History of dental problems   Hyperproteinemia   Last Assessment & Plan 12/09/2023 Office Visit Written 12/09/2023  9:43 AM by Jesus Bernardino MATSU, MD  Multiple Myeloma   He is currently on Revlimid , which is maintaining protein and ferritin levels. We will continue Revlimid .      Hypertension   Last Assessment & Plan 03/21/2024 Office Visit Written 03/21/2024  7:31 PM by Jesus Bernardino MATSU, MD  Assessment: Well-controlled today with blood pressure 110/80 mmHg. Currently on losartan  25 mg daily. Plan: Continue losartan  25 mg daily Encourage home BP monitoring Continue lifestyle  modifications including dietary approaches and weight management      Hyperviscosity   Last Assessment & Plan 05/24/2024 Office Visit Written 05/24/2024  7:53 PM by Jesus Bernardino MATSU, MD  Aspirin  is prescribed to mitigate blood clot risk from multiple myeloma. Continue aspirin  therapy.      Hypoalbuminemia   Hypocalcemia   Last Assessment & Plan 08/19/2022 Office Visit Written 08/19/2022 11:35 AM by Jesus Bernardino MATSU, MD  Discussed how K and Ca low, encouraged electrolyte solution like gatorade zero      Hypogammaglobulinemia [facility])   Last Assessment & Plan 05/24/2024 Office Visit Written 05/24/2024  7:53 PM by Jesus Bernardino MATSU, MD  Noted- likely related with Revlimid  and or multiple myeloma      Hypokalemia   Last Assessment & Plan 05/24/2024 Office Visit Written 05/24/2024  7:53 PM by Jesus Bernardino MATSU, MD  Potassium levels are managed with supplements, and he reports no muscle cramps, indicating effective management.  Continue potassium supplementation and reassess levels during the next blood work.      Hypomagnesemia   Language barrier affecting health care   Last Assessment & Plan 05/24/2024 Office Visit Written 05/24/2024  7:53 PM by Jesus Bernardino MATSU, MD  A language barrier has affected healthcare access, including a missed ophthalmology referral. Interpreter assistance is beneficial. Utilize interpreter services for scheduling the ophthalmology appointment. Rearrange ophthalmology appointment.      Microalbuminuria   Last Assessment & Plan 05/24/2024 Office Visit Written 05/24/2024  7:53 PM by Jesus Bernardino MATSU, MD  We will add Kerendia  if unresolved with Farxiga /losartan . Discussed and Will order lab testing to guide management.       Microcytosis   Last Assessment & Plan 05/24/2024 Office Visit Written 05/24/2024  7:53 PM by Jesus Bernardino MATSU, MD  Noted- likely related with Revlimid  and or multiple myeloma      Multiple myeloma in remission [facility])   Last Assessment & Plan 04/08/2024  Office Visit Written 04/10/2024  9:06 AM by Jesus Bernardino MATSU, MD  Multiple myeloma is in remission, maintained with Revlimid . Hyperglycemia may impact remission status. Effective glycemic control may prolong remission and survival, while persistent hyperglycemia could be life-threatening if myeloma recurs. Continue Revlimid  as prescribed. Monitor blood glucose closely to potentially prolong remission.      Peripheral neuropathy   Last Assessment & Plan 03/21/2024 Office Visit Written 03/21/2024  7:31 PM by Jesus Bernardino MATSU, MD    Assessment: Likely multifactorial, related to both diabetes and Revlimid  therapy. Currently managed with gabapentin  300 mg three times daily. Plan: Continue gabapentin  300 mg three times daily Monitor symptoms and efficacy Improved glycemic control may help reduce diabetes component        Type 2 diabetes mellitus with hyperglycemia [facility])   Last Assessment & Plan 05/24/2024 Office Visit Written 05/24/2024  7:53 PM by Jesus Bernardino MATSU, MD  Diabetes management is improving, with A1c reduced from 8.4 to 7 over six months. Continuous glucose monitoring was attempted but failed due to sensor displacement from physical activity. Blood glucose levels are satisfactory, and medication adherence is confirmed. Dietary habits, particularly rice consumption, were discussed for improvement. Continuous glucose meters will not be pursued unless available for free. Avoid rice, continue current diabetes medications, and consider alternative sensor placement with tape if monitoring is resumed.     :1}}  Updated Problem List Entries: No problems updated.  Background Reviewed: Problem List: has Multiple myeloma in remission (HCC); Counseling regarding advance care planning and goals of care; Hypoalbuminemia; Hyperviscosity; Type 2 diabetes mellitus with hyperglycemia (HCC); Chronic apical periodontitis; Hyperproteinemia; Blurry vision; Hypomagnesemia; Elevated ferritin; Microalbuminuria;  Hypocalcemia; Peripheral neuropathy; Language barrier affecting health care; Hypokalemia; History of dental problems; At high risk for venous thromboembolism; Diabetes mellitus with proteinuria (HCC); Hypertension; Chronic kidney disease; Microcytosis; and Hypogammaglobulinemia (HCC) on their problem list. Past Medical History:  has a past medical history of Abfraction (08/06/2022), Accretions on teeth (08/06/2022), Anemia, Attrition, teeth excessive (08/06/2022), Caries (08/06/2022), Defective dental restoration (08/06/2022), Elevated ferritin (08/07/2022), Hypocalcemia (08/19/2022), Loose, teeth (08/06/2022), Malocclusion (08/06/2022), Microalbuminuria (08/19/2022), Periodontal disease (08/06/2022), Peripheral neuropathy (09/08/2022), and Teeth missing (08/06/2022). Past Surgical History:   has a past surgical history that includes Appendectomy; Mylin US  Guide Vasc Access Right (05/15/2022); Larron Fluoro Guide CV Line Right (05/15/2022); Talin PATIENT EVAL TECH 0-60 MINS (05/19/2022); and Ansh BONE MARROW BIOPSY & ASPIRATION (02/11/2023). Social History:   reports that he has never smoked. He has never used smokeless tobacco. He reports that he  does not drink alcohol and does not use drugs. Family History:  family history is not on file. Allergies:  has no known allergies.   Medication Reconciliation: Current Outpatient Medications on File Prior to Visit  Medication Sig   Accu-Chek Softclix Lancets lancets Use to test blood sugars up to 4 times daily as directed.   acyclovir  (ZOVIRAX ) 400 MG tablet Take 1 tablet (400 mg total) by mouth 2 (two) times daily.   aspirin  EC 81 MG tablet Take 1 tablet (81 mg total) by mouth daily. Swallow whole.   Blood Glucose Monitoring Suppl (ACCU-CHEK GUIDE) w/Device KIT Use to test blood sugars up to 4 times daily as directed.   Continuous Glucose Receiver (DEXCOM G7 RECEIVER) DEVI 1 Device by Does not apply route continuous.   Continuous Glucose Sensor (DEXCOM G7 SENSOR) MISC 1  each by Does not apply route continuous. Change sensor every ten days.   Cyanocobalamin  (B-12) 1000 MCG CAPS Take 1 tablet by mouth daily.   dapagliflozin  propanediol (FARXIGA ) 10 MG TABS tablet Take 1 tablet (10 mg total) by mouth daily before breakfast. sa asar sa hroi   Finerenone  (KERENDIA ) 10 MG TABS Take 1 tablet (10 mg total) by mouth daily at 12 noon.   gabapentin  (NEURONTIN ) 300 MG capsule Take 1 capsule (300 mg total) by mouth 3 (three) times daily.   glucose blood (ACCU-CHEK GUIDE) test strip Use to test blood sugars up to 4 times daily as directed.   lenalidomide  (REVLIMID ) 25 MG capsule TAKE 1 CAPSULE BY MOUTH 1 TIME A DAY FOR 21 DAYS ON THEN 7 DAYS OFF   losartan  (COZAAR ) 25 MG tablet Take 1 tablet (25 mg total) by mouth daily. Start taking if blood pressure is over 130/80.  Protects kidneys and lowers blood pressure.   metFORMIN  (GLUCOPHAGE ) 500 MG tablet Take 2 tablets (1,000 mg total) by mouth 2 (two) times daily with a meal. 2 asar 1 wot, 2 wot mnhum  amang 1 hroi   potassium chloride  SA (KLOR-CON  M) 20 MEQ tablet TAKE 1 TABLET BY MOUTH TWICE A DAY   rosuvastatin  (CRESTOR ) 10 MG tablet Take 1 tablet (10 mg total) by mouth daily.   tirzepatide  (MOUNJARO ) 10 MG/0.5ML Pen Inject 10 mg into the skin once a week.   Current Facility-Administered Medications on File Prior to Visit  Medication   clotrimazole  (LOTRIMIN ) 1 % cream  There are no discontinued medications.   Physical Exam:    06/17/2024   12:50 PM 06/10/2024   12:49 PM 06/03/2024   11:45 AM  Vitals with BMI  Weight  166 lbs 4 oz   BMI  26.85   Systolic 128 133 867  Diastolic 78 76 68  Pulse 72 64 72  Vital signs reviewed.  Nursing notes reviewed. Weight trend reviewed. Physical Exam General Appearance:  No acute distress appreciable.   Well-groomed, healthy-appearing male.  Well proportioned with no abnormal fat distribution.  Good muscle tone. Pulmonary:  Normal work of breathing at rest, no respiratory distress  apparent.    Musculoskeletal: All extremities are intact.  Neurological:  Awake, alert, oriented, and engaged.  No obvious focal neurological deficits or cognitive impairments.  Sensorium seems unclouded.   Speech is clear and coherent with logical content. Psychiatric:  Appropriate mood, pleasant and cooperative demeanor, thoughtful and engaged during the exam Physical Exam  ***  Results:    06/24/2024    8:04 AM 03/21/2024    8:44 AM 02/22/2024    8:54 AM 09/09/2023  8:31 AM  PHQ 2/9 Scores  PHQ - 2 Score 0 0 0 0   Results   {Insert previous labs (optional):23779} {See past labs  Heme  Chem  Endocrine  Serology  Results Review (optional):1} No results found for any visits on 06/24/24. Appointment on 06/10/2024  Component Date Value Ref Range Status   WBC Count 06/10/2024 6.5  4.0 - 10.5 K/uL Final   RBC 06/10/2024 5.12  4.22 - 5.81 MIL/uL Final   Hemoglobin 06/10/2024 13.5  13.0 - 17.0 g/dL Final   HCT 93/86/7974 39.9  39.0 - 52.0 % Final   MCV 06/10/2024 77.9 (L)  80.0 - 100.0 fL Final   MCH 06/10/2024 26.4  26.0 - 34.0 pg Final   MCHC 06/10/2024 33.8  30.0 - 36.0 g/dL Final   RDW 93/86/7974 15.7 (H)  11.5 - 15.5 % Final   Platelet Count 06/10/2024 154  150 - 400 K/uL Final   nRBC 06/10/2024 0.0  0.0 - 0.2 % Final   Neutrophils Relative % 06/10/2024 43  % Final   Neutro Abs 06/10/2024 2.9  1.7 - 7.7 K/uL Final   Lymphocytes Relative 06/10/2024 35  % Final   Lymphs Abs 06/10/2024 2.2  0.7 - 4.0 K/uL Final   Monocytes Relative 06/10/2024 10  % Final   Monocytes Absolute 06/10/2024 0.7  0.1 - 1.0 K/uL Final   Eosinophils Relative 06/10/2024 10  % Final   Eosinophils Absolute 06/10/2024 0.6 (H)  0.0 - 0.5 K/uL Final   Basophils Relative 06/10/2024 2  % Final   Basophils Absolute 06/10/2024 0.1  0.0 - 0.1 K/uL Final   Immature Granulocytes 06/10/2024 0  % Final   Abs Immature Granulocytes 06/10/2024 0.01  0.00 - 0.07 K/uL Final   Sodium 06/10/2024 141  135 - 145  mmol/L Final   Potassium 06/10/2024 3.1 (L)  3.5 - 5.1 mmol/L Final   Chloride 06/10/2024 111  98 - 111 mmol/L Final   CO2 06/10/2024 25  22 - 32 mmol/L Final   Glucose, Bld 06/10/2024 153 (H)  70 - 99 mg/dL Final   BUN 93/86/7974 14  6 - 20 mg/dL Final   Creatinine, Ser 06/10/2024 0.68  0.61 - 1.24 mg/dL Final   Calcium  06/10/2024 8.2 (L)  8.9 - 10.3 mg/dL Final   Total Protein 93/86/7974 5.9 (L)  6.5 - 8.1 g/dL Final   Albumin  06/10/2024 3.8  3.5 - 5.0 g/dL Final   AST 93/86/7974 17  15 - 41 U/L Final   ALT 06/10/2024 27  0 - 44 U/L Final   Alkaline Phosphatase 06/10/2024 56  38 - 126 U/L Final   Total Bilirubin 06/10/2024 0.7  0.0 - 1.2 mg/dL Final   GFR, Estimated 06/10/2024 >60  >60 mL/min Final   Anion gap 06/10/2024 5  5 - 15 Final   IgG (Immunoglobin G), Serum 06/10/2024 570 (L)  603 - 1,613 mg/dL Final   IgA 93/86/7974 133  90 - 386 mg/dL Final   IgM (Immunoglobulin M), Srm 06/10/2024 15 (L)  20 - 172 mg/dL Final   Total Protein ELP 06/10/2024 5.6 (L)  6.0 - 8.5 g/dL Corrected   Albumin  SerPl Elph-Mcnc 06/10/2024 3.2  2.9 - 4.4 g/dL Corrected   Alpha 1 93/86/7974 0.1  0.0 - 0.4 g/dL Corrected   Alpha2 Glob SerPl Elph-Mcnc 06/10/2024 0.9  0.4 - 1.0 g/dL Corrected   B-Globulin SerPl Elph-Mcnc 06/10/2024 0.9  0.7 - 1.3 g/dL Corrected   Gamma Glob SerPl Elph-Mcnc 06/10/2024 0.6  0.4 - 1.8 g/dL Corrected   M Protein SerPl Elph-Mcnc 06/10/2024 0.2 (H)  Not Observed g/dL Corrected   Globulin, Total 06/10/2024 2.4  2.2 - 3.9 g/dL Corrected   Albumin /Glob SerPl 06/10/2024 1.4  0.7 - 1.7 Corrected   IFE 1 06/10/2024 Comment (A)   Corrected   Please Note 06/10/2024 Comment   Corrected  Appointment on 06/03/2024  Component Date Value Ref Range Status   WBC Count 06/03/2024 5.6  4.0 - 10.5 K/uL Final   RBC 06/03/2024 5.15  4.22 - 5.81 MIL/uL Final   Hemoglobin 06/03/2024 13.7  13.0 - 17.0 g/dL Final   HCT 93/93/7974 40.2  39.0 - 52.0 % Final   MCV 06/03/2024 78.1 (L)  80.0 - 100.0 fL  Final   MCH 06/03/2024 26.6  26.0 - 34.0 pg Final   MCHC 06/03/2024 34.1  30.0 - 36.0 g/dL Final   RDW 93/93/7974 15.8 (H)  11.5 - 15.5 % Final   Platelet Count 06/03/2024 148 (L)  150 - 400 K/uL Final   nRBC 06/03/2024 0.0  0.0 - 0.2 % Final   Neutrophils Relative % 06/03/2024 34  % Final   Neutro Abs 06/03/2024 1.9  1.7 - 7.7 K/uL Final   Lymphocytes Relative 06/03/2024 43  % Final   Lymphs Abs 06/03/2024 2.4  0.7 - 4.0 K/uL Final   Monocytes Relative 06/03/2024 12  % Final   Monocytes Absolute 06/03/2024 0.7  0.1 - 1.0 K/uL Final   Eosinophils Relative 06/03/2024 10  % Final   Eosinophils Absolute 06/03/2024 0.5  0.0 - 0.5 K/uL Final   Basophils Relative 06/03/2024 1  % Final   Basophils Absolute 06/03/2024 0.1  0.0 - 0.1 K/uL Final   Immature Granulocytes 06/03/2024 0  % Final   Abs Immature Granulocytes 06/03/2024 0.01  0.00 - 0.07 K/uL Final   Sodium 06/03/2024 142  135 - 145 mmol/L Final   Potassium 06/03/2024 3.3 (L)  3.5 - 5.1 mmol/L Final   Chloride 06/03/2024 110  98 - 111 mmol/L Final   CO2 06/03/2024 25  22 - 32 mmol/L Final   Glucose, Bld 06/03/2024 147 (H)  70 - 99 mg/dL Final   BUN 93/93/7974 14  6 - 20 mg/dL Final   Creatinine, Ser 06/03/2024 0.80  0.61 - 1.24 mg/dL Final   Calcium  06/03/2024 8.5 (L)  8.9 - 10.3 mg/dL Final   Total Protein 93/93/7974 5.8 (L)  6.5 - 8.1 g/dL Final   Albumin  06/03/2024 3.7  3.5 - 5.0 g/dL Final   AST 93/93/7974 16  15 - 41 U/L Final   ALT 06/03/2024 24  0 - 44 U/L Final   Alkaline Phosphatase 06/03/2024 44  38 - 126 U/L Final   Total Bilirubin 06/03/2024 0.7  0.0 - 1.2 mg/dL Final   GFR, Estimated 06/03/2024 >60  >60 mL/min Final   Anion gap 06/03/2024 7  5 - 15 Final   IgG (Immunoglobin G), Serum 06/03/2024 579 (L)  603 - 1,613 mg/dL Final   IgA 93/93/7974 127  90 - 386 mg/dL Final   IgM (Immunoglobulin M), Srm 06/03/2024 15 (L)  20 - 172 mg/dL Final   Total Protein ELP 06/03/2024 5.6 (L)  6.0 - 8.5 g/dL Corrected   Albumin  SerPl  Elph-Mcnc 06/03/2024 3.4  2.9 - 4.4 g/dL Corrected   Alpha 1 93/93/7974 0.1  0.0 - 0.4 g/dL Corrected   Alpha2 Glob SerPl Elph-Mcnc 06/03/2024 0.7  0.4 - 1.0 g/dL Corrected   B-Globulin SerPl Elph-Mcnc  06/03/2024 0.8  0.7 - 1.3 g/dL Corrected   Gamma Glob SerPl Elph-Mcnc 06/03/2024 0.6  0.4 - 1.8 g/dL Corrected   M Protein SerPl Elph-Mcnc 06/03/2024 0.3 (H)  Not Observed g/dL Corrected   Globulin, Total 06/03/2024 2.2  2.2 - 3.9 g/dL Corrected   Albumin /Glob SerPl 06/03/2024 1.6  0.7 - 1.7 Corrected   IFE 1 06/03/2024 Comment (A)   Corrected   Please Note 06/03/2024 Comment   Corrected  Appointment on 05/24/2024  Component Date Value Ref Range Status   Microalb, Ur 05/24/2024 2.7 (H)  0.0 - 1.9 mg/dL Final   Creatinine,U 94/72/7974 98.6  mg/dL Final   Microalb Creat Ratio 05/24/2024 27.4  0.0 - 30.0 mg/g Final  Appointment on 05/13/2024  Component Date Value Ref Range Status   IgG (Immunoglobin G), Serum 05/13/2024 600 (L)  603 - 1,613 mg/dL Final   IgA 94/83/7974 148  90 - 386 mg/dL Final   IgM (Immunoglobulin M), Srm 05/13/2024 22  20 - 172 mg/dL Final   Total Protein ELP 05/13/2024 5.5 (L)  6.0 - 8.5 g/dL Corrected   Albumin  SerPl Elph-Mcnc 05/13/2024 3.2  2.9 - 4.4 g/dL Corrected   Alpha 1 94/83/7974 0.2  0.0 - 0.4 g/dL Corrected   Alpha2 Glob SerPl Elph-Mcnc 05/13/2024 0.7  0.4 - 1.0 g/dL Corrected   B-Globulin SerPl Elph-Mcnc 05/13/2024 0.8  0.7 - 1.3 g/dL Corrected   Gamma Glob SerPl Elph-Mcnc 05/13/2024 0.6  0.4 - 1.8 g/dL Corrected   M Protein SerPl Elph-Mcnc 05/13/2024 0.2 (H)  Not Observed g/dL Corrected   Globulin, Total 05/13/2024 2.3  2.2 - 3.9 g/dL Corrected   Albumin /Glob SerPl 05/13/2024 1.4  0.7 - 1.7 Corrected   IFE 1 05/13/2024 Comment (A)   Corrected   Please Note 05/13/2024 Comment   Corrected   Sodium 05/13/2024 141  135 - 145 mmol/L Final   Potassium 05/13/2024 3.2 (L)  3.5 - 5.1 mmol/L Final   Chloride 05/13/2024 110  98 - 111 mmol/L Final   CO2 05/13/2024  27  22 - 32 mmol/L Final   Glucose, Bld 05/13/2024 147 (H)  70 - 99 mg/dL Final   BUN 94/83/7974 14  6 - 20 mg/dL Final   Creatinine, Ser 05/13/2024 0.71  0.61 - 1.24 mg/dL Final   Calcium  05/13/2024 8.8 (L)  8.9 - 10.3 mg/dL Final   Total Protein 94/83/7974 5.9 (L)  6.5 - 8.1 g/dL Final   Albumin  05/13/2024 3.7  3.5 - 5.0 g/dL Final   AST 94/83/7974 14 (L)  15 - 41 U/L Final   ALT 05/13/2024 14  0 - 44 U/L Final   Alkaline Phosphatase 05/13/2024 46  38 - 126 U/L Final   Total Bilirubin 05/13/2024 0.6  0.0 - 1.2 mg/dL Final   GFR, Estimated 05/13/2024 >60  >60 mL/min Final   Anion gap 05/13/2024 4 (L)  5 - 15 Final   WBC Count 05/13/2024 6.2  4.0 - 10.5 K/uL Final   RBC 05/13/2024 5.27  4.22 - 5.81 MIL/uL Final   Hemoglobin 05/13/2024 13.9  13.0 - 17.0 g/dL Final   HCT 94/83/7974 41.1  39.0 - 52.0 % Final   MCV 05/13/2024 78.0 (L)  80.0 - 100.0 fL Final   MCH 05/13/2024 26.4  26.0 - 34.0 pg Final   MCHC 05/13/2024 33.8  30.0 - 36.0 g/dL Final   RDW 94/83/7974 15.1  11.5 - 15.5 % Final   Platelet Count 05/13/2024 208  150 - 400 K/uL Final  nRBC 05/13/2024 0.0  0.0 - 0.2 % Final   Neutrophils Relative % 05/13/2024 46  % Final   Neutro Abs 05/13/2024 2.9  1.7 - 7.7 K/uL Final   Lymphocytes Relative 05/13/2024 37  % Final   Lymphs Abs 05/13/2024 2.3  0.7 - 4.0 K/uL Final   Monocytes Relative 05/13/2024 9  % Final   Monocytes Absolute 05/13/2024 0.6  0.1 - 1.0 K/uL Final   Eosinophils Relative 05/13/2024 5  % Final   Eosinophils Absolute 05/13/2024 0.3  0.0 - 0.5 K/uL Final   Basophils Relative 05/13/2024 3  % Final   Basophils Absolute 05/13/2024 0.2 (H)  0.0 - 0.1 K/uL Final   Immature Granulocytes 05/13/2024 0  % Final   Abs Immature Granulocytes 05/13/2024 0.02  0.00 - 0.07 K/uL Final  Appointment on 04/29/2024  Component Date Value Ref Range Status   WBC Count 04/29/2024 5.0  4.0 - 10.5 K/uL Final   RBC 04/29/2024 5.36  4.22 - 5.81 MIL/uL Final   Hemoglobin 04/29/2024 14.5   13.0 - 17.0 g/dL Final   HCT 94/97/7974 42.2  39.0 - 52.0 % Final   MCV 04/29/2024 78.7 (L)  80.0 - 100.0 fL Final   MCH 04/29/2024 27.1  26.0 - 34.0 pg Final   MCHC 04/29/2024 34.4  30.0 - 36.0 g/dL Final   RDW 94/97/7974 15.1  11.5 - 15.5 % Final   Platelet Count 04/29/2024 143 (L)  150 - 400 K/uL Final   nRBC 04/29/2024 0.0  0.0 - 0.2 % Final   Neutrophils Relative % 04/29/2024 40  % Final   Neutro Abs 04/29/2024 2.0  1.7 - 7.7 K/uL Final   Lymphocytes Relative 04/29/2024 37  % Final   Lymphs Abs 04/29/2024 1.9  0.7 - 4.0 K/uL Final   Monocytes Relative 04/29/2024 14  % Final   Monocytes Absolute 04/29/2024 0.7  0.1 - 1.0 K/uL Final   Eosinophils Relative 04/29/2024 7  % Final   Eosinophils Absolute 04/29/2024 0.3  0.0 - 0.5 K/uL Final   Basophils Relative 04/29/2024 2  % Final   Basophils Absolute 04/29/2024 0.1  0.0 - 0.1 K/uL Final   Immature Granulocytes 04/29/2024 0  % Final   Abs Immature Granulocytes 04/29/2024 0.01  0.00 - 0.07 K/uL Final   Sodium 04/29/2024 141  135 - 145 mmol/L Final   Potassium 04/29/2024 3.0 (L)  3.5 - 5.1 mmol/L Final   Chloride 04/29/2024 109  98 - 111 mmol/L Final   CO2 04/29/2024 27  22 - 32 mmol/L Final   Glucose, Bld 04/29/2024 139 (H)  70 - 99 mg/dL Final   BUN 94/97/7974 13  6 - 20 mg/dL Final   Creatinine, Ser 04/29/2024 0.72  0.61 - 1.24 mg/dL Final   Calcium  04/29/2024 8.4 (L)  8.9 - 10.3 mg/dL Final   Total Protein 94/97/7974 6.0 (L)  6.5 - 8.1 g/dL Final   Albumin  04/29/2024 4.0  3.5 - 5.0 g/dL Final   AST 94/97/7974 18  15 - 41 U/L Final   ALT 04/29/2024 27  0 - 44 U/L Final   Alkaline Phosphatase 04/29/2024 51  38 - 126 U/L Final   Total Bilirubin 04/29/2024 0.6  0.0 - 1.2 mg/dL Final   GFR, Estimated 04/29/2024 >60  >60 mL/min Final   Anion gap 04/29/2024 5  5 - 15 Final   IgG (Immunoglobin G), Serum 04/29/2024 659  603 - 1,613 mg/dL Final   IgA 94/97/7974 142  90 - 386 mg/dL  Final   IgM (Immunoglobulin M), Srm 04/29/2024 19 (L)   20 - 172 mg/dL Final   Total Protein ELP 04/29/2024 5.6 (L)  6.0 - 8.5 g/dL Corrected   Albumin  SerPl Elph-Mcnc 04/29/2024 3.4  2.9 - 4.4 g/dL Corrected   Alpha 1 94/97/7974 0.1  0.0 - 0.4 g/dL Corrected   Alpha2 Glob SerPl Elph-Mcnc 04/29/2024 0.7  0.4 - 1.0 g/dL Corrected   B-Globulin SerPl Elph-Mcnc 04/29/2024 0.8  0.7 - 1.3 g/dL Corrected   Gamma Glob SerPl Elph-Mcnc 04/29/2024 0.6  0.4 - 1.8 g/dL Corrected   M Protein SerPl Elph-Mcnc 04/29/2024 0.3 (H)  Not Observed g/dL Corrected   Globulin, Total 04/29/2024 2.2  2.2 - 3.9 g/dL Corrected   Albumin /Glob SerPl 04/29/2024 1.6  0.7 - 1.7 Corrected   IFE 1 04/29/2024 Comment (A)   Corrected   Please Note 04/29/2024 Comment   Corrected  Appointment on 04/15/2024  Component Date Value Ref Range Status   IgG (Immunoglobin G), Serum 04/15/2024 647  603 - 1,613 mg/dL Final   IgA 95/81/7974 137  90 - 386 mg/dL Final   IgM (Immunoglobulin M), Srm 04/15/2024 20  20 - 172 mg/dL Final   Total Protein ELP 04/15/2024 5.6 (L)  6.0 - 8.5 g/dL Corrected   Albumin  SerPl Elph-Mcnc 04/15/2024 3.4  2.9 - 4.4 g/dL Corrected   Alpha 1 95/81/7974 0.1  0.0 - 0.4 g/dL Corrected   Alpha2 Glob SerPl Elph-Mcnc 04/15/2024 0.7  0.4 - 1.0 g/dL Corrected   B-Globulin SerPl Elph-Mcnc 04/15/2024 0.7  0.7 - 1.3 g/dL Corrected   Gamma Glob SerPl Elph-Mcnc 04/15/2024 0.6  0.4 - 1.8 g/dL Corrected   M Protein SerPl Elph-Mcnc 04/15/2024 0.3 (H)  Not Observed g/dL Corrected   Globulin, Total 04/15/2024 2.2  2.2 - 3.9 g/dL Corrected   Albumin /Glob SerPl 04/15/2024 1.6  0.7 - 1.7 Corrected   IFE 1 04/15/2024 Comment (A)   Corrected   Please Note 04/15/2024 Comment   Corrected   Sodium 04/15/2024 140  135 - 145 mmol/L Final   Potassium 04/15/2024 3.3 (L)  3.5 - 5.1 mmol/L Final   Chloride 04/15/2024 109  98 - 111 mmol/L Final   CO2 04/15/2024 24  22 - 32 mmol/L Final   Glucose, Bld 04/15/2024 169 (H)  70 - 99 mg/dL Final   BUN 95/81/7974 12  6 - 20 mg/dL Final    Creatinine, Ser 04/15/2024 0.69  0.61 - 1.24 mg/dL Final   Calcium  04/15/2024 8.3 (L)  8.9 - 10.3 mg/dL Final   Total Protein 95/81/7974 5.9 (L)  6.5 - 8.1 g/dL Final   Albumin  04/15/2024 3.9  3.5 - 5.0 g/dL Final   AST 95/81/7974 16  15 - 41 U/L Final   ALT 04/15/2024 15  0 - 44 U/L Final   Alkaline Phosphatase 04/15/2024 44  38 - 126 U/L Final   Total Bilirubin 04/15/2024 0.5  0.0 - 1.2 mg/dL Final   GFR, Estimated 04/15/2024 >60  >60 mL/min Final   Anion gap 04/15/2024 7  5 - 15 Final   WBC Count 04/15/2024 5.0  4.0 - 10.5 K/uL Final   RBC 04/15/2024 5.25  4.22 - 5.81 MIL/uL Final   Hemoglobin 04/15/2024 14.1  13.0 - 17.0 g/dL Final   HCT 95/81/7974 41.4  39.0 - 52.0 % Final   MCV 04/15/2024 78.9 (L)  80.0 - 100.0 fL Final   MCH 04/15/2024 26.9  26.0 - 34.0 pg Final   MCHC 04/15/2024 34.1  30.0 -  36.0 g/dL Final   RDW 95/81/7974 14.9  11.5 - 15.5 % Final   Platelet Count 04/15/2024 201  150 - 400 K/uL Final   nRBC 04/15/2024 0.0  0.0 - 0.2 % Final   Neutrophils Relative % 04/15/2024 35  % Final   Neutro Abs 04/15/2024 1.8  1.7 - 7.7 K/uL Final   Lymphocytes Relative 04/15/2024 47  % Final   Lymphs Abs 04/15/2024 2.3  0.7 - 4.0 K/uL Final   Monocytes Relative 04/15/2024 11  % Final   Monocytes Absolute 04/15/2024 0.6  0.1 - 1.0 K/uL Final   Eosinophils Relative 04/15/2024 6  % Final   Eosinophils Absolute 04/15/2024 0.3  0.0 - 0.5 K/uL Final   Basophils Relative 04/15/2024 1  % Final   Basophils Absolute 04/15/2024 0.1  0.0 - 0.1 K/uL Final   Immature Granulocytes 04/15/2024 0  % Final   Abs Immature Granulocytes 04/15/2024 0.01  0.00 - 0.07 K/uL Final  Appointment on 03/25/2024  Component Date Value Ref Range Status   WBC Count 03/25/2024 5.9  4.0 - 10.5 K/uL Final   RBC 03/25/2024 5.34  4.22 - 5.81 MIL/uL Final   Hemoglobin 03/25/2024 14.3  13.0 - 17.0 g/dL Final   HCT 96/71/7974 43.2  39.0 - 52.0 % Final   MCV 03/25/2024 80.9  80.0 - 100.0 fL Final   MCH 03/25/2024 26.8   26.0 - 34.0 pg Final   MCHC 03/25/2024 33.1  30.0 - 36.0 g/dL Final   RDW 96/71/7974 15.5  11.5 - 15.5 % Final   Platelet Count 03/25/2024 144 (L)  150 - 400 K/uL Final   nRBC 03/25/2024 0.0  0.0 - 0.2 % Final   Neutrophils Relative % 03/25/2024 49  % Final   Neutro Abs 03/25/2024 2.9  1.7 - 7.7 K/uL Final   Lymphocytes Relative 03/25/2024 32  % Final   Lymphs Abs 03/25/2024 1.9  0.7 - 4.0 K/uL Final   Monocytes Relative 03/25/2024 7  % Final   Monocytes Absolute 03/25/2024 0.4  0.1 - 1.0 K/uL Final   Eosinophils Relative 03/25/2024 10  % Final   Eosinophils Absolute 03/25/2024 0.6 (H)  0.0 - 0.5 K/uL Final   Basophils Relative 03/25/2024 2  % Final   Basophils Absolute 03/25/2024 0.1  0.0 - 0.1 K/uL Final   Immature Granulocytes 03/25/2024 0  % Final   Abs Immature Granulocytes 03/25/2024 0.01  0.00 - 0.07 K/uL Final   Sodium 03/25/2024 139  135 - 145 mmol/L Final   Potassium 03/25/2024 3.4 (L)  3.5 - 5.1 mmol/L Final   Chloride 03/25/2024 108  98 - 111 mmol/L Final   CO2 03/25/2024 25  22 - 32 mmol/L Final   Glucose, Bld 03/25/2024 172 (H)  70 - 99 mg/dL Final   BUN 96/71/7974 14  6 - 20 mg/dL Final   Creatinine, Ser 03/25/2024 0.77  0.61 - 1.24 mg/dL Final   Calcium  03/25/2024 8.8 (L)  8.9 - 10.3 mg/dL Final   Total Protein 96/71/7974 6.2 (L)  6.5 - 8.1 g/dL Final   Albumin  03/25/2024 4.0  3.5 - 5.0 g/dL Final   AST 96/71/7974 13 (L)  15 - 41 U/L Final   ALT 03/25/2024 11  0 - 44 U/L Final   Alkaline Phosphatase 03/25/2024 51  38 - 126 U/L Final   Total Bilirubin 03/25/2024 0.8  0.0 - 1.2 mg/dL Final   GFR, Estimated 03/25/2024 >60  >60 mL/min Final   Anion gap 03/25/2024 6  5 - 15 Final   IgG (Immunoglobin G), Serum 03/25/2024 742  603 - 1,613 mg/dL Final   IgA 96/71/7974 137  90 - 386 mg/dL Final   IgM (Immunoglobulin M), Srm 03/25/2024 21  20 - 172 mg/dL Final   Total Protein ELP 03/25/2024 6.0  6.0 - 8.5 g/dL Corrected   Albumin  SerPl Elph-Mcnc 03/25/2024 3.4  2.9 - 4.4  g/dL Corrected   Alpha 1 96/71/7974 0.2  0.0 - 0.4 g/dL Corrected   Alpha2 Glob SerPl Elph-Mcnc 03/25/2024 0.9  0.4 - 1.0 g/dL Corrected   B-Globulin SerPl Elph-Mcnc 03/25/2024 0.9  0.7 - 1.3 g/dL Corrected   Gamma Glob SerPl Elph-Mcnc 03/25/2024 0.7  0.4 - 1.8 g/dL Corrected   M Protein SerPl Elph-Mcnc 03/25/2024 0.3 (H)  Not Observed g/dL Corrected   Globulin, Total 03/25/2024 2.6  2.2 - 3.9 g/dL Corrected   Albumin /Glob SerPl 03/25/2024 1.4  0.7 - 1.7 Corrected   IFE 1 03/25/2024 Comment (A)   Corrected   Please Note 03/25/2024 Comment   Corrected  Office Visit on 03/21/2024  Component Date Value Ref Range Status   Hemoglobin A1C 03/21/2024 7.0 (A)  4.0 - 5.6 % Final  Appointment on 03/11/2024  Component Date Value Ref Range Status   WBC Count 03/11/2024 6.1  4.0 - 10.5 K/uL Final   RBC 03/11/2024 5.74  4.22 - 5.81 MIL/uL Final   Hemoglobin 03/11/2024 15.3  13.0 - 17.0 g/dL Final   HCT 96/85/7974 45.9  39.0 - 52.0 % Final   MCV 03/11/2024 80.0  80.0 - 100.0 fL Final   MCH 03/11/2024 26.7  26.0 - 34.0 pg Final   MCHC 03/11/2024 33.3  30.0 - 36.0 g/dL Final   RDW 96/85/7974 15.9 (H)  11.5 - 15.5 % Final   Platelet Count 03/11/2024 193  150 - 400 K/uL Final   nRBC 03/11/2024 0.0  0.0 - 0.2 % Final   Neutrophils Relative % 03/11/2024 38  % Final   Neutro Abs 03/11/2024 2.3  1.7 - 7.7 K/uL Final   Lymphocytes Relative 03/11/2024 43  % Final   Lymphs Abs 03/11/2024 2.6  0.7 - 4.0 K/uL Final   Monocytes Relative 03/11/2024 11  % Final   Monocytes Absolute 03/11/2024 0.7  0.1 - 1.0 K/uL Final   Eosinophils Relative 03/11/2024 6  % Final   Eosinophils Absolute 03/11/2024 0.3  0.0 - 0.5 K/uL Final   Basophils Relative 03/11/2024 2  % Final   Basophils Absolute 03/11/2024 0.1  0.0 - 0.1 K/uL Final   Immature Granulocytes 03/11/2024 0  % Final   Abs Immature Granulocytes 03/11/2024 0.02  0.00 - 0.07 K/uL Final   Sodium 03/11/2024 140  135 - 145 mmol/L Final   Potassium 03/11/2024 3.2 (L)   3.5 - 5.1 mmol/L Final   Chloride 03/11/2024 108  98 - 111 mmol/L Final   CO2 03/11/2024 25  22 - 32 mmol/L Final   Glucose, Bld 03/11/2024 205 (H)  70 - 99 mg/dL Final   BUN 96/85/7974 15  6 - 20 mg/dL Final   Creatinine, Ser 03/11/2024 0.70  0.61 - 1.24 mg/dL Final   Calcium  03/11/2024 8.6 (L)  8.9 - 10.3 mg/dL Final   Total Protein 96/85/7974 6.7  6.5 - 8.1 g/dL Final   Albumin  03/11/2024 4.2  3.5 - 5.0 g/dL Final   AST 96/85/7974 16  15 - 41 U/L Final   ALT 03/11/2024 35  0 - 44 U/L Final   Alkaline Phosphatase 03/11/2024  58  38 - 126 U/L Final   Total Bilirubin 03/11/2024 0.8  0.0 - 1.2 mg/dL Final   GFR, Estimated 03/11/2024 >60  >60 mL/min Final   Anion gap 03/11/2024 7  5 - 15 Final   IgG (Immunoglobin G), Serum 03/11/2024 729  603 - 1,613 mg/dL Final   IgA 96/85/7974 155  90 - 386 mg/dL Final   IgM (Immunoglobulin M), Srm 03/11/2024 26  20 - 172 mg/dL Final   Total Protein ELP 03/11/2024 6.4  6.0 - 8.5 g/dL Corrected   Albumin  SerPl Elph-Mcnc 03/11/2024 3.6  2.9 - 4.4 g/dL Corrected   Alpha 1 96/85/7974 0.2  0.0 - 0.4 g/dL Corrected   Alpha2 Glob SerPl Elph-Mcnc 03/11/2024 0.9  0.4 - 1.0 g/dL Corrected   B-Globulin SerPl Elph-Mcnc 03/11/2024 1.0  0.7 - 1.3 g/dL Corrected   Gamma Glob SerPl Elph-Mcnc 03/11/2024 0.7  0.4 - 1.8 g/dL Corrected   M Protein SerPl Elph-Mcnc 03/11/2024 0.3 (H)  Not Observed g/dL Corrected   Globulin, Total 03/11/2024 2.8  2.2 - 3.9 g/dL Corrected   Albumin /Glob SerPl 03/11/2024 1.3  0.7 - 1.7 Corrected   IFE 1 03/11/2024 Comment (A)   Corrected   Please Note 03/11/2024 Comment   Corrected  Appointment on 02/26/2024  Component Date Value Ref Range Status   WBC Count 02/26/2024 6.5  4.0 - 10.5 K/uL Final   RBC 02/26/2024 5.60  4.22 - 5.81 MIL/uL Final   Hemoglobin 02/26/2024 14.9  13.0 - 17.0 g/dL Final   HCT 97/71/7974 44.8  39.0 - 52.0 % Final   MCV 02/26/2024 80.0  80.0 - 100.0 fL Final   MCH 02/26/2024 26.6  26.0 - 34.0 pg Final   MCHC  02/26/2024 33.3  30.0 - 36.0 g/dL Final   RDW 97/71/7974 16.1 (H)  11.5 - 15.5 % Final   Platelet Count 02/26/2024 184  150 - 400 K/uL Final   nRBC 02/26/2024 0.0  0.0 - 0.2 % Final   Neutrophils Relative % 02/26/2024 47  % Final   Neutro Abs 02/26/2024 3.0  1.7 - 7.7 K/uL Final   Lymphocytes Relative 02/26/2024 38  % Final   Lymphs Abs 02/26/2024 2.5  0.7 - 4.0 K/uL Final   Monocytes Relative 02/26/2024 8  % Final   Monocytes Absolute 02/26/2024 0.5  0.1 - 1.0 K/uL Final   Eosinophils Relative 02/26/2024 6  % Final   Eosinophils Absolute 02/26/2024 0.4  0.0 - 0.5 K/uL Final   Basophils Relative 02/26/2024 1  % Final   Basophils Absolute 02/26/2024 0.1  0.0 - 0.1 K/uL Final   Immature Granulocytes 02/26/2024 0  % Final   Abs Immature Granulocytes 02/26/2024 0.02  0.00 - 0.07 K/uL Final   Sodium 02/26/2024 137  135 - 145 mmol/L Final   Potassium 02/26/2024 3.4 (L)  3.5 - 5.1 mmol/L Final   Chloride 02/26/2024 105  98 - 111 mmol/L Final   CO2 02/26/2024 26  22 - 32 mmol/L Final   Glucose, Bld 02/26/2024 207 (H)  70 - 99 mg/dL Final   BUN 97/71/7974 9  6 - 20 mg/dL Final   Creatinine, Ser 02/26/2024 0.69  0.61 - 1.24 mg/dL Final   Calcium  02/26/2024 8.9  8.9 - 10.3 mg/dL Final   Total Protein 97/71/7974 6.3 (L)  6.5 - 8.1 g/dL Final   Albumin  02/26/2024 4.0  3.5 - 5.0 g/dL Final   AST 97/71/7974 13 (L)  15 - 41 U/L Final   ALT 02/26/2024 21  0 - 44 U/L Final   Alkaline Phosphatase 02/26/2024 52  38 - 126 U/L Final   Total Bilirubin 02/26/2024 0.8  0.0 - 1.2 mg/dL Final   GFR, Estimated 02/26/2024 >60  >60 mL/min Final   Anion gap 02/26/2024 6  5 - 15 Final   IgG (Immunoglobin G), Serum 02/26/2024 728  603 - 1,613 mg/dL Final   IgA 97/71/7974 117  90 - 386 mg/dL Final   IgM (Immunoglobulin M), Srm 02/26/2024 29  20 - 172 mg/dL Final   Total Protein ELP 02/26/2024 5.9 (L)  6.0 - 8.5 g/dL Corrected   Albumin  SerPl Elph-Mcnc 02/26/2024 3.5  2.9 - 4.4 g/dL Corrected   Alpha 1 97/71/7974  0.3  0.0 - 0.4 g/dL Corrected   Alpha2 Glob SerPl Elph-Mcnc 02/26/2024 0.6  0.4 - 1.0 g/dL Corrected   B-Globulin SerPl Elph-Mcnc 02/26/2024 0.9  0.7 - 1.3 g/dL Corrected   Gamma Glob SerPl Elph-Mcnc 02/26/2024 0.6  0.4 - 1.8 g/dL Corrected   M Protein SerPl Elph-Mcnc 02/26/2024 0.3 (H)  Not Observed g/dL Corrected   Globulin, Total 02/26/2024 2.4  2.2 - 3.9 g/dL Corrected   Albumin /Glob SerPl 02/26/2024 1.5  0.7 - 1.7 Corrected   IFE 1 02/26/2024 Comment (A)   Corrected   Please Note 02/26/2024 Comment   Corrected  There may be more visits with results that are not included.  No image results found. No results found.       Assessment & Plan Hypocalcemia  Hypokalemia  Type 2 diabetes mellitus with hyperglycemia, without long-term current use of insulin  (HCC)   Assessment and Plan Assessment & Plan           Orders Placed in Encounter:   Lab Orders  No laboratory test(s) ordered today   Imaging Orders  No imaging studies ordered today   Referral Orders  No referral(s) requested today   No orders of the defined types were placed in this encounter.    No orders of the defined types were placed in this encounter.  ED Discharge Orders     None         This document was synthesized by artificial intelligence ([employer]bridge) using HIPAA-compliant recording of the clinical interaction;   We discussed the use of AI scribe software for clinical note transcription with the patient, who gave verbal consent to proceed. additional Info: This encounter employed state-of-the-art, real-time, collaborative documentation. The patient actively reviewed and assisted in updating their electronic medical record on a shared screen, ensuring transparency and facilitating joint problem-solving for the problem list, overview, and plan. This approach promotes accurate, informed care. The treatment plan was discussed and reviewed in detail, including medication safety, potential side effects, and  all patient questions. We confirmed understanding and comfort with the plan. Follow-up instructions were established, including contacting the office for any concerns, returning if symptoms worsen, persist, or new symptoms develop, and precautions for potential emergency department visits.

## 2024-06-25 ENCOUNTER — Encounter: Payer: Self-pay | Admitting: Internal Medicine

## 2024-06-25 NOTE — Assessment & Plan Note (Signed)
 Chronic kidney disease presents with low potassium and calcium  levels. Increase potassium supplementation from 10 mg to 20 mg. Low calcium  is likely due to insufficient vitamin D  intake, so prescribe vitamin D  and K2 supplements. Order blood work to check vitamin D , A1c, and potassium levels.

## 2024-06-25 NOTE — Assessment & Plan Note (Signed)
 Multiple Myeloma, in remission High-risk chronic condition, stable on maintenance lenalidomide  (Revlimid ). Management is coordinated with the patient's oncologist. All medication decisions, particularly those affecting renal function and electrolytes, are made with consideration for potential drug interactions and side effects of his maintenance chemotherapy. Continue current therapy as directed by oncology and continue monitoring CBC and CMP for toxicity.

## 2024-06-25 NOTE — Assessment & Plan Note (Signed)
 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 This requires intervention to correct electrolyte imbalance and mitigate long-term risks to bone health. Initiated new prescription for high-dose Cholecalciferol (Vitamin D3) 5000 IU daily and added Vitamin K2 to support calcium  metabolism. Will monitor response with repeat labs in 3 months.

## 2024-06-25 NOTE — Assessment & Plan Note (Signed)
Interpreter was used

## 2024-06-25 NOTE — Assessment & Plan Note (Signed)
 Chronic Hypokalemia, persistent Unstable chronic problem requiring medication adjustment, with recent potassium level of 3.1 mEq/L (ref 3.5-5.1). This condition is likely multifactorial, exacerbated by medications, and poses a significant risk of cardiac arrhythmia. To more aggressively replete stores and mitigate this risk, a new prescription for potassium chloride  20 mEq daily was initiated. Will monitor for therapeutic response with a repeat chemistry panel at the next follow-up.

## 2024-06-25 NOTE — Assessment & Plan Note (Signed)
 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 Glycemic control is further complicated by poor adherence to continuous glucose monitoring (CGM) due to sensor issues. Management requires balancing multiple medications with competing effects on renal function and electrolytes. Continue current multi-agent therapy including metformin , tirzepatide  (Mounjaro ), and dapagliflozin  (Farxiga ) with ongoing assessment of efficacy. Continue finerenone  (Kerendia ) and losartan  for renal protection, monitoring for hyperkalemia risk. Counseled patient on using overpatches to improve Dexcom sensor adherence and maintaining a dietary log.

## 2024-06-26 ENCOUNTER — Ambulatory Visit: Payer: Self-pay | Admitting: Internal Medicine

## 2024-06-26 NOTE — Progress Notes (Signed)
 Recommend 5000 d3 daily. Rest of labs good.

## 2024-07-05 ENCOUNTER — Inpatient Hospital Stay (HOSPITAL_BASED_OUTPATIENT_CLINIC_OR_DEPARTMENT_OTHER): Admitting: Physician Assistant

## 2024-07-05 ENCOUNTER — Other Ambulatory Visit: Payer: Self-pay | Admitting: Physician Assistant

## 2024-07-05 ENCOUNTER — Inpatient Hospital Stay

## 2024-07-05 ENCOUNTER — Inpatient Hospital Stay: Attending: Hematology

## 2024-07-05 ENCOUNTER — Encounter: Payer: Self-pay | Admitting: Hematology

## 2024-07-05 VITALS — BP 141/85 | HR 66 | Temp 98.3°F | Resp 20

## 2024-07-05 VITALS — BP 128/84 | HR 80 | Temp 97.3°F | Resp 20 | Wt 165.9 lb

## 2024-07-05 DIAGNOSIS — C9001 Multiple myeloma in remission: Secondary | ICD-10-CM | POA: Diagnosis not present

## 2024-07-05 DIAGNOSIS — E1142 Type 2 diabetes mellitus with diabetic polyneuropathy: Secondary | ICD-10-CM | POA: Diagnosis not present

## 2024-07-05 DIAGNOSIS — Z5112 Encounter for antineoplastic immunotherapy: Secondary | ICD-10-CM | POA: Diagnosis present

## 2024-07-05 DIAGNOSIS — Z7189 Other specified counseling: Secondary | ICD-10-CM

## 2024-07-05 DIAGNOSIS — C9000 Multiple myeloma not having achieved remission: Secondary | ICD-10-CM | POA: Insufficient documentation

## 2024-07-05 DIAGNOSIS — K036 Deposits [accretions] on teeth: Secondary | ICD-10-CM | POA: Diagnosis not present

## 2024-07-05 DIAGNOSIS — D649 Anemia, unspecified: Secondary | ICD-10-CM | POA: Insufficient documentation

## 2024-07-05 DIAGNOSIS — K029 Dental caries, unspecified: Secondary | ICD-10-CM | POA: Insufficient documentation

## 2024-07-05 DIAGNOSIS — Z7985 Long-term (current) use of injectable non-insulin antidiabetic drugs: Secondary | ICD-10-CM | POA: Diagnosis not present

## 2024-07-05 DIAGNOSIS — Z7984 Long term (current) use of oral hypoglycemic drugs: Secondary | ICD-10-CM | POA: Insufficient documentation

## 2024-07-05 LAB — CBC WITH DIFFERENTIAL (CANCER CENTER ONLY)
Abs Immature Granulocytes: 0.02 K/uL (ref 0.00–0.07)
Basophils Absolute: 0 K/uL (ref 0.0–0.1)
Basophils Relative: 1 %
Eosinophils Absolute: 0.3 K/uL (ref 0.0–0.5)
Eosinophils Relative: 4 %
HCT: 42.9 % (ref 39.0–52.0)
Hemoglobin: 14.5 g/dL (ref 13.0–17.0)
Immature Granulocytes: 0 %
Lymphocytes Relative: 43 %
Lymphs Abs: 3 K/uL (ref 0.7–4.0)
MCH: 26.8 pg (ref 26.0–34.0)
MCHC: 33.8 g/dL (ref 30.0–36.0)
MCV: 79.2 fL — ABNORMAL LOW (ref 80.0–100.0)
Monocytes Absolute: 0.5 K/uL (ref 0.1–1.0)
Monocytes Relative: 7 %
Neutro Abs: 3.2 K/uL (ref 1.7–7.7)
Neutrophils Relative %: 45 %
Platelet Count: 164 K/uL (ref 150–400)
RBC: 5.42 MIL/uL (ref 4.22–5.81)
RDW: 15.9 % — ABNORMAL HIGH (ref 11.5–15.5)
WBC Count: 7 K/uL (ref 4.0–10.5)
nRBC: 0 % (ref 0.0–0.2)

## 2024-07-05 LAB — COMPREHENSIVE METABOLIC PANEL WITH GFR
ALT: 18 U/L (ref 0–44)
AST: 13 U/L — ABNORMAL LOW (ref 15–41)
Albumin: 3.8 g/dL (ref 3.5–5.0)
Alkaline Phosphatase: 57 U/L (ref 38–126)
Anion gap: 8 (ref 5–15)
BUN: 14 mg/dL (ref 6–20)
CO2: 26 mmol/L (ref 22–32)
Calcium: 8.6 mg/dL — ABNORMAL LOW (ref 8.9–10.3)
Chloride: 105 mmol/L (ref 98–111)
Creatinine, Ser: 0.77 mg/dL (ref 0.61–1.24)
GFR, Estimated: >60 mL/min (ref >60)
Glucose, Bld: 139 mg/dL — ABNORMAL HIGH (ref 70–99)
Potassium: 3.4 mmol/L — ABNORMAL LOW (ref 3.5–5.1)
Sodium: 139 mmol/L (ref 135–145)
Total Bilirubin: 0.8 mg/dL (ref 0.0–1.2)
Total Protein: 5.9 g/dL — ABNORMAL LOW (ref 6.5–8.1)

## 2024-07-05 MED ORDER — MONTELUKAST SODIUM 10 MG PO TABS
10.0000 mg | ORAL_TABLET | Freq: Once | ORAL | Status: AC
  Administered 2024-07-05: 10 mg via ORAL
  Filled 2024-07-05: qty 1

## 2024-07-05 MED ORDER — DEXAMETHASONE 4 MG PO TABS
2.0000 mg | ORAL_TABLET | Freq: Once | ORAL | Status: AC
  Administered 2024-07-05: 2 mg via ORAL
  Filled 2024-07-05: qty 1

## 2024-07-05 MED ORDER — SODIUM CHLORIDE 0.9 % IV SOLN: INTRAVENOUS | Status: DC

## 2024-07-05 MED ORDER — FAMOTIDINE 20 MG PO TABS
20.0000 mg | ORAL_TABLET | Freq: Once | ORAL | Status: AC
  Administered 2024-07-05: 20 mg via ORAL
  Filled 2024-07-05: qty 1

## 2024-07-05 MED ORDER — DARATUMUMAB-HYALURONIDASE-FIHJ 1800-30000 MG-UT/15ML ~~LOC~~ SOLN
1800.0000 mg | Freq: Once | SUBCUTANEOUS | Status: AC
  Administered 2024-07-05: 1800 mg via SUBCUTANEOUS
  Filled 2024-07-05: qty 15

## 2024-07-05 MED ORDER — ZOLEDRONIC ACID 4 MG/100ML IV SOLN
4.0000 mg | Freq: Once | INTRAVENOUS | Status: AC
  Administered 2024-07-05: 4 mg via INTRAVENOUS
  Filled 2024-07-05: qty 100

## 2024-07-05 MED ORDER — ACETAMINOPHEN 325 MG PO TABS
650.0000 mg | ORAL_TABLET | Freq: Once | ORAL | Status: AC
  Administered 2024-07-05: 650 mg via ORAL
  Filled 2024-07-05: qty 2

## 2024-07-05 MED ORDER — DIPHENHYDRAMINE HCL 25 MG PO CAPS
50.0000 mg | ORAL_CAPSULE | Freq: Once | ORAL | Status: AC
  Administered 2024-07-05: 50 mg via ORAL
  Filled 2024-07-05: qty 2

## 2024-07-05 NOTE — Patient Instructions (Signed)
 [facility] - A DEPT OF [facility]  Discharge Instructions: Thank you for choosing [facility] to provide your oncology and hematology care.   If you have a lab appointment with the Cancer Center, please go directly to the Cancer Center and check in at the registration area.   Wear comfortable clothing and clothing appropriate for easy access to any Portacath or PICC line.   We strive to give you quality time with your provider. You may need to reschedule your appointment if you arrive late (15 or more minutes).  Arriving late affects you and other patients whose appointments are after yours.  Also, if you miss three or more appointments without notifying the office, you may be dismissed from the clinic at the provider's discretion.      For prescription refill requests, have your pharmacy contact our office and allow 72 hours for refills to be completed.    Today you received the following chemotherapy and/or immunotherapy agents: Darzalex Faspro, Zometa   To help prevent nausea and vomiting after your treatment, we encourage you to take your nausea medication as directed.  BELOW ARE SYMPTOMS THAT SHOULD BE REPORTED IMMEDIATELY: *FEVER GREATER THAN 100.4 F (38 C) OR HIGHER *CHILLS OR SWEATING *NAUSEA AND VOMITING THAT IS NOT CONTROLLED WITH YOUR NAUSEA MEDICATION *UNUSUAL SHORTNESS OF BREATH *UNUSUAL BRUISING OR BLEEDING *URINARY PROBLEMS (pain or burning when urinating, or frequent urination) *BOWEL PROBLEMS (unusual diarrhea, constipation, pain near the anus) TENDERNESS IN MOUTH AND THROAT WITH OR WITHOUT PRESENCE OF ULCERS (sore throat, sores in mouth, or a toothache) UNUSUAL RASH, SWELLING OR PAIN  UNUSUAL VAGINAL DISCHARGE OR ITCHING   Items with * indicate a potential emergency and should be followed up as soon as possible or go to the Emergency Department if any problems should occur.  Please show the CHEMOTHERAPY ALERT CARD or  IMMUNOTHERAPY ALERT CARD at check-in to the Emergency Department and triage nurse.  Should you have questions after your visit or need to cancel or reschedule your appointment, please contact [facility] - A DEPT [facility]  Dept: [phone]  and follow the prompts.  Office hours are 8:00 a.m. to 4:30 p.m. Monday - Friday. Please note that voicemails left after 4:00 p.m. may not be returned until the following business day.  We are closed weekends and major holidays. You have access to a nurse at all times for urgent questions. Please call the main number to the clinic Dept: [phone] and follow the prompts.   For any non-urgent questions, you may also contact your provider using MyChart. We now offer e-Visits for anyone 73 and older to request care online for non-urgent symptoms. For details visit [email or web address].   Also download the MyChart app! Go to the app store, search "MyChart", open the app, select [place], and log in with your MyChart username and password.

## 2024-07-05 NOTE — Progress Notes (Signed)
 Patient observed for 1 hour following second dose of Darzalex  Faspro.  Tolerated treatment well without incident.  VSS at discharge.  Ambulated to lobby.

## 2024-07-05 NOTE — Progress Notes (Signed)
 [name]  HEMATOLOGY/ONCOLOGY CLINIC NOTE  Date of Service: 07/05/2024  Chief complaint - Follow-up for continued evaluation and management of high risk multiple myeloma  Current treatment-daratumumab /Revlimid /dexamethasone   INTERVAL HISTORY:  Donald Suarez is a 58 y.o. male is here with his [name] interpreter for continued evaluation and management of his multiple myeloma. He was last seen by Dr. Onesimo on 06/03/2024 He reports that he is tolerating therapy without any significant limitations.   Mr. Fuhrmann reports his energy and appetite are stable. He denies any  new side effects to his treatment. He denies nausea, vomiting or bowel habit changes. He denies easy bruising or signs of active bleeding. He denies any new bone or back pain. He denies fevers, chills, sweats, shortness of breath, chest pain or cough. He has no other complaints.    MEDICAL HISTORY:  Past Medical History:  Diagnosis Date   Abfraction 08/06/2022   Accretions on teeth 08/06/2022   Anemia    Attrition, teeth excessive 08/06/2022   Caries 08/06/2022   Defective dental restoration 08/06/2022   Elevated ferritin 08/07/2022   Hypocalcemia 08/19/2022   In setting of multiple myeloma    Loose, teeth 08/06/2022   Malocclusion 08/06/2022   Microalbuminuria 08/19/2022   Periodontal disease 08/06/2022   Peripheral neuropathy 09/08/2022   Started 08/2022 A/w diabetes and revlimid  usage Burning on top of left foot   Teeth missing 08/06/2022    SURGICAL HISTORY: Past Surgical History:  Procedure Laterality Date   APPENDECTOMY     Donald BONE MARROW BIOPSY & ASPIRATION  02/11/2023   Donald Suarez FLUORO GUIDE CV LINE RIGHT  05/15/2022   Donald Suarez PATIENT EVAL TECH 0-60 MINS  05/19/2022   Donald Suarez US  GUIDE VASC ACCESS RIGHT  05/15/2022    SOCIAL HISTORY: Social History   Socioeconomic History   Marital status: Married    Spouse name: Not on file   Number of children: Not on file   Years of education: Not on file   Highest education level:  Not on file  Occupational History   Not on file  Tobacco Use   Smoking status: Never   Smokeless tobacco: Never  Vaping Use   Vaping status: Never Used  Substance and Sexual Activity   Alcohol use: Never   Drug use: Never   Sexual activity: Not on file  Other Topics Concern   Not on file  Social History Narrative   Not on file   Social Drivers of Health   Financial Resource Strain: Medium Risk (05/21/2022)   Overall Financial Resource Strain (CARDIA)    Difficulty of Paying Living Expenses: Somewhat hard  Food Insecurity: Food Insecurity Present (05/21/2022)   Hunger Vital Sign    Worried About Running Out of Food in the Last Year: Sometimes true    Ran Out of Food in the Last Year: Sometimes true  Transportation Needs: No Transportation Needs (05/21/2022)   PRAPARE - Administrator, Civil Service (Medical): No    Lack of Transportation (Non-Medical): No  Physical Activity: Not on file  Stress: Not on file  Social Connections: Not on file  Intimate Partner Violence: Not on file    FAMILY HISTORY: No family history on file.  ALLERGIES:  has no known allergies.  MEDICATIONS:  . Allergies as of 07/05/2024   No Known Allergies      Medication List        Accurate as of July 05, 2024  8:54 PM. If you have any questions, ask your nurse  or doctor.          Accu-Chek Guide test strip Generic drug: glucose blood Use to test blood sugars up to 4 times daily as directed.   Accu-Chek Guide w/Device Kit Use to test blood sugars up to 4 times daily as directed.   Accu-Chek Softclix Lancets lancets Use to test blood sugars up to 4 times daily as directed.   acyclovir  400 MG tablet Commonly known as: ZOVIRAX  Take 1 tablet (400 mg total) by mouth 2 (two) times daily.   aspirin  EC 81 MG tablet Take 1 tablet (81 mg total) by mouth daily. Swallow whole.   Suarez-12 1000 MCG Caps Take 1 tablet by mouth daily.   dapagliflozin  propanediol 10 MG Tabs  tablet Commonly known as: Farxiga  Take 1 tablet (10 mg total) by mouth daily before breakfast. sa asar sa hroi   Dexcom G7 Receiver Devi 1 Device by Does not apply route continuous.   Dexcom G7 Sensor Misc 1 each by Does not apply route continuous. Change sensor every ten days.   gabapentin  300 MG capsule Commonly known as: NEURONTIN  Take 1 capsule (300 mg total) by mouth 3 (three) times daily.   Kerendia  10 MG Tabs Generic drug: Finerenone  Take 1 tablet (10 mg total) by mouth daily at 12 noon.   lenalidomide  25 MG capsule Commonly known as: REVLIMID  TAKE 1 CAPSULE BY MOUTH 1 TIME A DAY FOR 21 DAYS ON THEN 7 DAYS OFF   losartan  25 MG tablet Commonly known as: COZAAR  Take 1 tablet (25 mg total) by mouth daily. Start taking if blood pressure is over 130/80.  Protects kidneys and lowers blood pressure.   metFORMIN  500 MG tablet Commonly known as: GLUCOPHAGE  Take 2 tablets (1,000 mg total) by mouth 2 (two) times daily with a meal. 2 asar 1 wot, 2 wot mnhum  amang 1 hroi   potassium chloride  SA 20 MEQ tablet Commonly known as: KLOR-CON  M Take 1 tablet (20 mEq total) by mouth daily.   rosuvastatin  10 MG tablet Commonly known as: CRESTOR  Take 1 tablet (10 mg total) by mouth daily.   tirzepatide  10 MG/0.5ML Pen Commonly known as: MOUNJARO  Inject 10 mg into the skin once a week.   Vitamin D3 125 MCG (5000 UT) Caps Take 1 capsule (5,000 Units total) by mouth daily.   Vitamin K2  100 MCG Caps Take 1 capsule by mouth daily at 6 (six) AM.         REVIEW OF SYSTEMS:   10 Point review of Systems was done is negative except as noted above.   PHYSICAL EXAMINATION: Vitals:   07/05/24 1026  BP: 128/84  Pulse: 80  Resp: 20  Temp: (!) 97.3 F (36.3 C)  SpO2: 98%   GENERAL:alert, in no acute distress and comfortable SKIN: no acute rashes, no significant lesions EYES: conjunctiva are pink and non-injected, sclera anicteric LUNGS: clear to auscultation Suarez/l with normal  respiratory effort HEART: regular rate & rhythm Extremity: no pedal edema PSYCH: alert & oriented x 3 with fluent speech NEURO: no focal motor/sensory deficits     LABORATORY DATA:  I have reviewed the data as listed  .    Latest Ref Rng & Units 07/05/2024    9:33 AM 06/10/2024   12:04 PM 06/03/2024    8:41 AM  CBC  WBC 4.0 - 10.5 K/uL 7.0  6.5  5.6   Hemoglobin 13.0 - 17.0 g/dL 85.4  86.4  86.2   Hematocrit 39.0 - 52.0 % 42.9  39.9  40.2   Platelets 150 - 400 K/uL 164  154  148     .    Latest Ref Rng & Units 07/05/2024    9:33 AM 06/10/2024   12:04 PM 06/03/2024    8:41 AM  CMP  Glucose 70 - 99 mg/dL 860  846  852   BUN 6 - 20 mg/dL 14  14  14    Creatinine 0.61 - 1.24 mg/dL 9.22  9.31  9.19   Sodium 135 - 145 mmol/L 139  141  142   Potassium 3.5 - 5.1 mmol/L 3.4  3.1  3.3   Chloride 98 - 111 mmol/L 105  111  110   CO2 22 - 32 mmol/L 26  25  25    Calcium  8.9 - 10.3 mg/dL 8.6  8.2  8.5   Total Protein 6.5 - 8.1 g/dL 5.9  5.9  5.8   Total Bilirubin 0.0 - 1.2 mg/dL 0.8  0.7  0.7   Alkaline Phos 38 - 126 U/L 57  56  44   AST 15 - 41 U/L 13  17  16    ALT 0 - 44 U/L 18  27  24     . Lab Results  Component Value Date   LDH 114 10/10/2022      RADIOGRAPHIC STUDIES: I have personally reviewed the radiological images as listed and agreed with the findings in the report. No results found.  ASSESSMENT & PLAN:   1) R-ISS stage III high risk IgG Lambda Multiple myeloma not on treatment for more than 1 year due to patient's lack of follow-up. -Patient diagnosed December 2021 and received 1 cycle of CyBorD. -Had previously presented with anemia renal insufficiency and hyperviscosity symptoms. -Bone marrow Bx- 90% plasma cells -Cytogenetics showed: Del(1p): Not Detected  Dup(1q): Not Detected  Gains(15): DETECTED  Gains(5 and 9): Not Detected  Del(13q)/-13: DETECTED  Del(17p)(TP53): Not Detected  IGH(Rearrangement): SEE BELOW   IgH complex: t(4;14): DETECTED  t(11;14):  Not Detected  t(14;16): Not Detected  t(14;20): Not Detected   Cytogenetics: Normal male karyotype.   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 WBC 7.0, Hgb 14.5, Plt 164, creatinine normal, calcium  8.6.  -Most recent myeloma labs from 06/10/2024 showed M protein measuring 0.2 g/dL (overall stable) -Tolerating Daratumumab /Revlimid /Dex without any prohibitive toxicities.  -Proceed with treatment today without any dose modifications.  -Continue treatment every 2 weeks. Last Zometa  was on 06/03/2024. Next due next treatment due today  Follow-up -RTC in 4 weeks for next toxicity check   All of the patient's questions were answered with apparent satisfaction. The patient knows to call the clinic with any problems, questions or concerns.  I have spent a total of 30 minutes minutes of face-to-face and non-face-to-face time, preparing to see the patient,  performing a medically appropriate examination, counseling and educating the patient, ordering tests/procedures, documenting clinical information in the electronic health record,and care coordination.   Johnston Police PA-C Dept of Hematology and Oncology [facility] Phone: [phone]

## 2024-07-05 NOTE — Progress Notes (Signed)
 Per Johnston Police, PA, OK to administer Zometa  today with corrected calcium  8.76.

## 2024-07-06 ENCOUNTER — Other Ambulatory Visit: Payer: Self-pay

## 2024-07-07 NOTE — Progress Notes (Signed)
 Pt given Revlimid  on 07/05/24. It is delivered to the [facility]. Pt to start taking this cycle on 07/10/24 - 07/31/24. Pt will take off 08/01/24 - 8/10 /25. New cycle will start on 08/08/24. Pt aware when to start this medication.

## 2024-07-08 LAB — MULTIPLE MYELOMA PANEL, SERUM
Albumin SerPl Elph-Mcnc: 3.6 g/dL (ref 2.9–4.4)
Albumin/Glob SerPl: 1.7 (ref 0.7–1.7)
Alpha 1: 0.1 g/dL (ref 0.0–0.4)
Alpha2 Glob SerPl Elph-Mcnc: 0.8 g/dL (ref 0.4–1.0)
B-Globulin SerPl Elph-Mcnc: 0.8 g/dL (ref 0.7–1.3)
Gamma Glob SerPl Elph-Mcnc: 0.6 g/dL (ref 0.4–1.8)
Globulin, Total: 2.2 g/dL (ref 2.2–3.9)
IgA: 139 mg/dL (ref 90–386)
IgG (Immunoglobin G), Serum: 664 mg/dL (ref 603–1613)
IgM (Immunoglobulin M), Srm: 21 mg/dL (ref 20–172)
M Protein SerPl Elph-Mcnc: 0.3 g/dL — ABNORMAL HIGH
Total Protein ELP: 5.8 g/dL — ABNORMAL LOW (ref 6.0–8.5)

## 2024-07-11 ENCOUNTER — Inpatient Hospital Stay

## 2024-07-15 ENCOUNTER — Other Ambulatory Visit: Payer: Self-pay | Admitting: Hematology

## 2024-07-15 DIAGNOSIS — Z7189 Other specified counseling: Secondary | ICD-10-CM

## 2024-07-15 DIAGNOSIS — C9001 Multiple myeloma in remission: Secondary | ICD-10-CM

## 2024-07-16 ENCOUNTER — Other Ambulatory Visit: Payer: Self-pay

## 2024-07-17 ENCOUNTER — Other Ambulatory Visit: Payer: Self-pay

## 2024-07-19 ENCOUNTER — Inpatient Hospital Stay

## 2024-07-19 VITALS — BP 128/78 | HR 66 | Temp 98.3°F | Resp 18 | Wt 165.0 lb

## 2024-07-19 DIAGNOSIS — Z5112 Encounter for antineoplastic immunotherapy: Secondary | ICD-10-CM | POA: Diagnosis not present

## 2024-07-19 DIAGNOSIS — Z7189 Other specified counseling: Secondary | ICD-10-CM

## 2024-07-19 DIAGNOSIS — C9001 Multiple myeloma in remission: Secondary | ICD-10-CM

## 2024-07-19 LAB — CBC WITH DIFFERENTIAL (CANCER CENTER ONLY)
Abs Immature Granulocytes: 0.02 K/uL (ref 0.00–0.07)
Basophils Absolute: 0 K/uL (ref 0.0–0.1)
Basophils Relative: 1 %
Eosinophils Absolute: 0.3 K/uL (ref 0.0–0.5)
Eosinophils Relative: 5 %
HCT: 42.3 % (ref 39.0–52.0)
Hemoglobin: 14.5 g/dL (ref 13.0–17.0)
Immature Granulocytes: 0 %
Lymphocytes Relative: 43 %
Lymphs Abs: 2.8 K/uL (ref 0.7–4.0)
MCH: 27 pg (ref 26.0–34.0)
MCHC: 34.3 g/dL (ref 30.0–36.0)
MCV: 78.8 fL — ABNORMAL LOW (ref 80.0–100.0)
Monocytes Absolute: 0.6 K/uL (ref 0.1–1.0)
Monocytes Relative: 10 %
Neutro Abs: 2.6 K/uL (ref 1.7–7.7)
Neutrophils Relative %: 41 %
Platelet Count: 163 K/uL (ref 150–400)
RBC: 5.37 MIL/uL (ref 4.22–5.81)
RDW: 15.9 % — ABNORMAL HIGH (ref 11.5–15.5)
WBC Count: 6.4 K/uL (ref 4.0–10.5)
nRBC: 0 % (ref 0.0–0.2)

## 2024-07-19 LAB — COMPREHENSIVE METABOLIC PANEL WITH GFR
ALT: 14 U/L (ref 0–44)
AST: 14 U/L — ABNORMAL LOW (ref 15–41)
Albumin: 3.7 g/dL (ref 3.5–5.0)
Alkaline Phosphatase: 49 U/L (ref 38–126)
Anion gap: 8 (ref 5–15)
BUN: 11 mg/dL (ref 6–20)
CO2: 26 mmol/L (ref 22–32)
Calcium: 8.6 mg/dL — ABNORMAL LOW (ref 8.9–10.3)
Chloride: 106 mmol/L (ref 98–111)
Creatinine, Ser: 0.75 mg/dL (ref 0.61–1.24)
GFR, Estimated: >60 mL/min (ref >60)
Glucose, Bld: 145 mg/dL — ABNORMAL HIGH (ref 70–99)
Potassium: 3.7 mmol/L (ref 3.5–5.1)
Sodium: 140 mmol/L (ref 135–145)
Total Bilirubin: 0.7 mg/dL (ref 0.0–1.2)
Total Protein: 6.1 g/dL — ABNORMAL LOW (ref 6.5–8.1)

## 2024-07-19 MED ORDER — MONTELUKAST SODIUM 10 MG PO TABS
10.0000 mg | ORAL_TABLET | Freq: Once | ORAL | Status: AC
  Administered 2024-07-19: 10 mg via ORAL
  Filled 2024-07-19: qty 1

## 2024-07-19 MED ORDER — DARATUMUMAB-HYALURONIDASE-FIHJ 1800-30000 MG-UT/15ML ~~LOC~~ SOLN
1800.0000 mg | Freq: Once | SUBCUTANEOUS | Status: AC
  Administered 2024-07-19: 1800 mg via SUBCUTANEOUS
  Filled 2024-07-19: qty 15

## 2024-07-19 MED ORDER — ACETAMINOPHEN 325 MG PO TABS
650.0000 mg | ORAL_TABLET | Freq: Once | ORAL | Status: AC
  Administered 2024-07-19: 650 mg via ORAL
  Filled 2024-07-19: qty 2

## 2024-07-19 MED ORDER — FAMOTIDINE 20 MG PO TABS
20.0000 mg | ORAL_TABLET | Freq: Once | ORAL | Status: AC
  Administered 2024-07-19: 20 mg via ORAL
  Filled 2024-07-19: qty 1

## 2024-07-19 MED ORDER — DEXAMETHASONE 4 MG PO TABS
2.0000 mg | ORAL_TABLET | Freq: Once | ORAL | Status: AC
  Administered 2024-07-19: 2 mg via ORAL
  Filled 2024-07-19: qty 1

## 2024-07-19 MED ORDER — DIPHENHYDRAMINE HCL 25 MG PO CAPS
50.0000 mg | ORAL_CAPSULE | Freq: Once | ORAL | Status: AC
  Administered 2024-07-19: 50 mg via ORAL
  Filled 2024-07-19: qty 2

## 2024-07-19 NOTE — Patient Instructions (Signed)
 [facility] - A DEPT OF [facility]  Discharge Instructions: Thank you for choosing [facility] to provide your oncology and hematology care.   If you have a lab appointment with the Cancer Center, please go directly to the Cancer Center and check in at the registration area.   Wear comfortable clothing and clothing appropriate for easy access to any Portacath or PICC line.   We strive to give you quality time with your provider. You may need to reschedule your appointment if you arrive late (15 or more minutes).  Arriving late affects you and other patients whose appointments are after yours.  Also, if you miss three or more appointments without notifying the office, you may be dismissed from the clinic at the provider's discretion.      For prescription refill requests, have your pharmacy contact our office and allow 72 hours for refills to be completed.    Today you received the following chemotherapy and/or immunotherapy agents Darzalex      To help prevent nausea and vomiting after your treatment, we encourage you to take your nausea medication as directed.  BELOW ARE SYMPTOMS THAT SHOULD BE REPORTED IMMEDIATELY: *FEVER GREATER THAN 100.4 F (38 C) OR HIGHER *CHILLS OR SWEATING *NAUSEA AND VOMITING THAT IS NOT CONTROLLED WITH YOUR NAUSEA MEDICATION *UNUSUAL SHORTNESS OF BREATH *UNUSUAL BRUISING OR BLEEDING *URINARY PROBLEMS (pain or burning when urinating, or frequent urination) *BOWEL PROBLEMS (unusual diarrhea, constipation, pain near the anus) TENDERNESS IN MOUTH AND THROAT WITH OR WITHOUT PRESENCE OF ULCERS (sore throat, sores in mouth, or a toothache) UNUSUAL RASH, SWELLING OR PAIN  UNUSUAL VAGINAL DISCHARGE OR ITCHING   Items with * indicate a potential emergency and should be followed up as soon as possible or go to the Emergency Department if any problems should occur.  Please show the CHEMOTHERAPY ALERT CARD or IMMUNOTHERAPY  ALERT CARD at check-in to the Emergency Department and triage nurse.  Should you have questions after your visit or need to cancel or reschedule your appointment, please contact [facility] - A DEPT OF [facility]  Dept: [phone]  and follow the prompts.  Office hours are 8:00 a.m. to 4:30 p.m. Monday - Friday. Please note that voicemails left after 4:00 p.m. may not be returned until the following business day.  We are closed weekends and major holidays. You have access to a nurse at all times for urgent questions. Please call the main number to the clinic Dept: [phone] and follow the prompts.   For any non-urgent questions, you may also contact your provider using MyChart. We now offer e-Visits for anyone 20 and older to request care online for non-urgent symptoms. For details visit [email or web address].   Also download the MyChart app! Go to the app store, search "MyChart", open the app, select [place], and log in with your MyChart username and password.

## 2024-07-21 ENCOUNTER — Other Ambulatory Visit: Payer: Self-pay | Admitting: Hematology

## 2024-07-21 DIAGNOSIS — C9000 Multiple myeloma not having achieved remission: Secondary | ICD-10-CM

## 2024-07-21 LAB — MULTIPLE MYELOMA PANEL, SERUM
Albumin SerPl Elph-Mcnc: 3.3 g/dL (ref 2.9–4.4)
Albumin/Glob SerPl: 1.4 (ref 0.7–1.7)
Alpha 1: 0.1 g/dL (ref 0.0–0.4)
Alpha2 Glob SerPl Elph-Mcnc: 0.9 g/dL (ref 0.4–1.0)
B-Globulin SerPl Elph-Mcnc: 0.7 g/dL (ref 0.7–1.3)
Gamma Glob SerPl Elph-Mcnc: 0.7 g/dL (ref 0.4–1.8)
Globulin, Total: 2.4 g/dL (ref 2.2–3.9)
IgA: 133 mg/dL (ref 90–386)
IgG (Immunoglobin G), Serum: 654 mg/dL (ref 603–1613)
IgM (Immunoglobulin M), Srm: 21 mg/dL (ref 20–172)
M Protein SerPl Elph-Mcnc: 0.3 g/dL — ABNORMAL HIGH
Total Protein ELP: 5.7 g/dL — ABNORMAL LOW (ref 6.0–8.5)

## 2024-07-26 ENCOUNTER — Encounter: Payer: Self-pay | Admitting: Hematology

## 2024-07-26 ENCOUNTER — Encounter: Payer: Self-pay | Admitting: Internal Medicine

## 2024-07-26 ENCOUNTER — Ambulatory Visit (INDEPENDENT_AMBULATORY_CARE_PROVIDER_SITE_OTHER): Admitting: Internal Medicine

## 2024-07-26 VITALS — BP 122/70 | HR 73 | Temp 98.0°F | Ht 66.0 in | Wt 167.2 lb

## 2024-07-26 DIAGNOSIS — Z7984 Long term (current) use of oral hypoglycemic drugs: Secondary | ICD-10-CM

## 2024-07-26 DIAGNOSIS — B351 Tinea unguium: Secondary | ICD-10-CM

## 2024-07-26 DIAGNOSIS — G609 Hereditary and idiopathic neuropathy, unspecified: Secondary | ICD-10-CM | POA: Diagnosis not present

## 2024-07-26 DIAGNOSIS — Z758 Other problems related to medical facilities and other health care: Secondary | ICD-10-CM

## 2024-07-26 DIAGNOSIS — R809 Proteinuria, unspecified: Secondary | ICD-10-CM

## 2024-07-26 DIAGNOSIS — Z0000 Encounter for general adult medical examination without abnormal findings: Secondary | ICD-10-CM

## 2024-07-26 DIAGNOSIS — Z603 Acculturation difficulty: Secondary | ICD-10-CM

## 2024-07-26 DIAGNOSIS — C9001 Multiple myeloma in remission: Secondary | ICD-10-CM | POA: Diagnosis not present

## 2024-07-26 DIAGNOSIS — R718 Other abnormality of red blood cells: Secondary | ICD-10-CM

## 2024-07-26 DIAGNOSIS — Z8719 Personal history of other diseases of the digestive system: Secondary | ICD-10-CM

## 2024-07-26 DIAGNOSIS — E1129 Type 2 diabetes mellitus with other diabetic kidney complication: Secondary | ICD-10-CM

## 2024-07-26 MED ORDER — CICLOPIROX OLAMINE 0.77 % EX CREA: TOPICAL_CREAM | Freq: Two times a day (BID) | CUTANEOUS | 3 refills | Status: DC

## 2024-07-26 MED ORDER — FREESTYLE LIBRE 3 PLUS SENSOR MISC: 11 refills | Status: DC

## 2024-07-26 NOTE — Assessment & Plan Note (Addendum)
 Multiple dental issues (apical periodontitis, caries, restoration defects) History of loose teeth  Key Issues: Confirm dental follow-up, especially due to myeloma and bisphosphonate risk Assess for dental pain/infection History of dental problems; follow-up with dentistry ongoing. No acute dental complaints.

## 2024-07-26 NOTE — Assessment & Plan Note (Signed)
 Will order lab testing to guide management. Proteinuria resolved from May to June, and current kidney function is normal. Monitoring for potential kidney damage due to diabetes. Continue Farxiga  and Carindia to protect kidney function. Obtain urine sample today to monitor protein levels.

## 2024-07-26 NOTE — Patient Instructions (Addendum)
 VISIT SUMMARY: You had a follow-up appointment to review your multiple myeloma and diabetes management. We discussed your current medications, recent lab results, and any symptoms you are experiencing. You reported bone pain and fatigue but no new infections. Your blood sugar readings have been stable, and you are awaiting new glucose sensors. Your kidney function has improved, and you have no current tooth pain. You are scheduled for an eye exam and a dental appointment soon.  YOUR PLAN: -TYPE 2 DIABETES MELLITUS WITH MONITORING CHALLENGES: Type 2 diabetes is a condition where your body does not use insulin  properly, leading to high blood sugar levels. Your A1c was close to the target in June, but you have had issues with your glucose sensor. We have ordered a new Freestyle Libre 3 Plus glucose monitor for you to pick up at [facility] on [address]. You should avoid foods that cause your blood sugar to go over 180 mg/dL. We performed a foot exam today and provided cream for foot fungus. You are also referred to an eye doctor for a diabetic eye exam. Please schedule a follow-up in two weeks to review the new glucose monitor setup.  -CHRONIC KIDNEY DISEASE: Chronic kidney disease is a condition where your kidneys are damaged and can't filter blood as well as they should. Your protein levels in urine have normalized, and your current kidney function is normal. Continue taking Farxiga  and Carindia to protect your kidneys. We will obtain a urine sample today to monitor protein levels.  -MULTIPLE MYELOMA NOT HAVING ACHIEVED REMISSION: Multiple myeloma is a type of blood cancer that affects plasma cells in your bone marrow. You are continuing on Revlimid  without experiencing side effects like nerve pain.  -ESSENTIAL HYPERTENSION: Essential hypertension is high blood pressure with no identifiable cause. You are continuing on losartan  and aspirin  as part of your management plan.  -HYPERLIPIDEMIA: Hyperlipidemia is  having high levels of fats (lipids) in your blood. You are continuing on rosuvastatin  as part of your management plan.  -ONYCHOMYCOSIS (TOENAIL FUNGUS): Onychomycosis is a fungal infection of the toenails. You have a mild fungal infection at the tips of your toenails, likely due to moisture and sugary sweat. We provided you with antifungal cream to treat the toenail fungus.  INSTRUCTIONS: Please pick up your new Freestyle Libre 3 Plus glucose monitor at [facility] on [address]. Schedule a follow-up appointment in two weeks to review the new glucose monitor setup. Continue taking your current medications as prescribed. Avoid foods that cause your blood sugar to go over 180 mg/dL. Attend your scheduled eye exam and dental appointment. We will obtain a urine sample today to monitor your kidney function.

## 2024-07-26 NOTE — Assessment & Plan Note (Addendum)
 A1c is close to goal as of June. He faces challenges with glucose sensor usage due to technical issues and language barriers. Previous readings showed postprandial glucose around 145-148 mg/dL and fasting around 861-859 mg/dL. No current nerve pain or complications from diabetes. Order Freestyle Libre 3 Plus glucose monitor for pickup at [facility] on [address]. Re Refer to an eye doctor for a diabetic eye exam. Perform a foot exam today and provide cream for foot fungus. Instruct to avoid foods causing glucose readings over 180 mg/dL. Schedule follow-up in two weeks to review new glucose monitor setup. Recent labs: HbA1c: 7.1% (improved, but above target)  Glucose: 139-153 mg/dL fasting we keep trying to get sensor to work but he keeps struggling to use continuous glucose monitor due to language barrier issues with using sensor. Microalbumin/creatinine: Last abnormal (27.4 mg/g), most recent unable to calculate On metformin , dapagliflozin , tirzepatide  Key Issues: Improved but still above target; reinforce adherence, dietary/lifestyle education Monitor for hypoglycemia, especially with CKD Continue SGLT2 inhibitor (dapagliflozin ) for renal protection Review Dexcom/Accu-Chek use, barriers to self-management (language) Reinforce need for annual eye/foot exams (overdue) Assess for neuropathy, foot changes HPI/Assessment stub: Type 2 diabetes with improved but suboptimal glycemic control (HbA1c 7.1%). On metformin , dapagliflozin , tirzepatide . Proteinuria persists. Review self-monitoring, reinforce education, and address overdue eye/foot exams.

## 2024-07-26 NOTE — Assessment & Plan Note (Addendum)
 Recent labs: Persistent small IgG lambda M-protein (0.3 g/dL); stable over 3 months Immunofixation: IgG lambda, also IgG kappa detected (possible therapy interference) SPEP: Mild hypogammaglobulinemia, low total protein, low albumin  CBC: Stable, mild microcytosis, high RDW, no cytopenias Calcium : Mildly low, stable Key Issues: No evidence of progression/relapse (labs stable, no cytopenias, no hypercalcemia) Continue surveillance; reviewed for new symptoms (bone pain, infections, fatigue) -he has none Confirm compliance with Revlimid  (lenalidomide ) pickup Reviewed for therapy side effects (neuropathy, cytopenias, infection risk)- he is not having any Patient with high-risk IgG lambda multiple myeloma, currently in remission, on lenalidomide  maintenance. Recent labs show stable low-level monoclonal protein and no cytopenias. No new symptoms reported. Continue surveillance and therapy.

## 2024-07-26 NOTE — Progress Notes (Signed)
 ==============================  [facility]: [phone]   -- Medical Office Visit --  Patient: Donald Suarez      Age: 58 y.o.       Sex:  male  Date:   07/26/2024 Today's Healthcare Provider: Bernardino KANDICE Cone, MD  ==============================   Chief Complaint: Diabetes Patient: [phone] with high-risk IgG lambda multiple myeloma (in remission), T2DM, CKD, hypertension, and multiple metabolic abnormalities. Interpreter required [employer] dialect).  Phone interpreter used today. History of Present Illness 58 year old male with multiple myeloma and diabetes who presents for follow-up on his conditions.  He experiences bone pain and fatigue. He continues to take Revlimid  without experiencing nerve pain as a side effect. No new infections are reported.  Regarding his diabetes, his A1c was close to goal in June, but he had issues with his glucose sensor not working properly. Blood sugar readings are 145-148 mg/dL after meals and 861-859 mg/dL before meals. He is currently out of sensors and is awaiting new ones. He has stopped taking gabapentin  as the nerve discomfort has resolved. No numbness, tingling, or nerve pain is reported.  He is taking rosuvastatin , aspirin , losartan , Farxiga , and Carindia for his heart and kidney health. He had protein in his urine previously, but it normalized from May to June. He is scheduled for a dental appointment in August or September and reports no current tooth pain.  He is NOT yet scheduled for an eye exam.  Lab Results  Component Value Date   MICROALBUR <0.7 06/24/2024   MICROALBUR 2.7 (H) 05/24/2024  Will repeat urine, is still taking SGLT2 and Kerendia  and trying to monitor sugar.  Lab Results  Component Value Date   GFR 100.57 12/09/2023   GFR 102.43 09/09/2023   GFR 103.96 06/09/2023   GFR 108.05 08/12/2022  Showed patient there is no chronic kidney disease despite proteinuria.   Background  Reviewed: Problem List: has Multiple myeloma in remission (HCC); Counseling regarding advance care planning and goals of care; Hyperviscosity; Type 2 diabetes mellitus with hyperglycemia (HCC); Chronic apical periodontitis; Hyperproteinemia; Blurry vision; Elevated ferritin; Microalbuminuria; Hypocalcemia; Language barrier affecting health care; History of dental problems; At high risk for venous thromboembolism; Diabetes mellitus with proteinuria (HCC); Hypertension; Chronic kidney disease; Microcytosis; and Hypogammaglobulinemia (HCC) on their problem list. Past Medical History:  has a past medical history of Abfraction (08/06/2022), Accretions on teeth (08/06/2022), Anemia, Attrition, teeth excessive (08/06/2022), Caries (08/06/2022), Defective dental restoration (08/06/2022), Elevated ferritin (08/07/2022), Hypoalbuminemia (05/13/2022), Hypocalcemia (08/19/2022), Hypokalemia (03/09/2023), Hypomagnesemia (12/20/2020), Loose, teeth (08/06/2022), Malocclusion (08/06/2022), Microalbuminuria (08/19/2022), Periodontal disease (08/06/2022), Peripheral neuropathy (09/08/2022), and Teeth missing (08/06/2022). Past Surgical History:   has a past surgical history that includes Appendectomy; Ciro US  Guide Vasc Access Right (05/15/2022); Renley Fluoro Guide CV Line Right (05/15/2022); Azariel PATIENT EVAL TECH 0-60 MINS (05/19/2022); and Johanna BONE MARROW BIOPSY & ASPIRATION (02/11/2023). Social History:   reports that he has never smoked. He has never used smokeless tobacco. He reports that he does not drink alcohol and does not use drugs. Family History:  family history is not on file. Allergies:  has no known allergies.   Medication Reconciliation: Current Outpatient Medications on File Prior to Visit  Medication Sig   Accu-Chek Softclix Lancets lancets Use to test blood sugars up to 4 times daily as directed.   acyclovir  (ZOVIRAX ) 400 MG tablet Take 1 tablet (400 mg total) by mouth 2 (two) times daily.   aspirin  EC 81 MG  tablet Take 1 tablet (81  mg total) by mouth daily. Swallow whole.   Blood Glucose Monitoring Suppl (ACCU-CHEK GUIDE) w/Device KIT Use to test blood sugars up to 4 times daily as directed.   Cholecalciferol (VITAMIN D3) 125 MCG (5000 UT) CAPS Take 1 capsule (5,000 Units total) by mouth daily.   Cyanocobalamin  (B-12) 1000 MCG CAPS Take 1 tablet by mouth daily.   dapagliflozin  propanediol (FARXIGA ) 10 MG TABS tablet Take 1 tablet (10 mg total) by mouth daily before breakfast. sa asar sa hroi   Finerenone  (KERENDIA ) 10 MG TABS Take 1 tablet (10 mg total) by mouth daily at 12 noon.   glucose blood (ACCU-CHEK GUIDE) test strip Use to test blood sugars up to 4 times daily as directed.   losartan  (COZAAR ) 25 MG tablet Take 1 tablet (25 mg total) by mouth daily. Start taking if blood pressure is over 130/80.  Protects kidneys and lowers blood pressure.   Menaquinone-7 (VITAMIN K2 ) 100 MCG CAPS Take 1 capsule by mouth daily at 6 (six) AM.   metFORMIN  (GLUCOPHAGE ) 500 MG tablet Take 2 tablets (1,000 mg total) by mouth 2 (two) times daily with a meal. 2 asar 1 wot, 2 wot mnhum  amang 1 hroi   potassium chloride  SA (KLOR-CON  M) 20 MEQ tablet Take 1 tablet (20 mEq total) by mouth daily.   rosuvastatin  (CRESTOR ) 10 MG tablet Take 1 tablet (10 mg total) by mouth daily.   tirzepatide  (MOUNJARO ) 10 MG/0.5ML Pen Inject 10 mg into the skin once a week.   lenalidomide  (REVLIMID ) 25 MG capsule TAKE 1 CAPSULE BY MOUTH 1 TIME A DAY FOR 21 DAYS ON THEN 7 DAYS OFF   Current Facility-Administered Medications on File Prior to Visit  Medication   clotrimazole  (LOTRIMIN ) 1 % cream   Medications Discontinued During This Encounter  Medication Reason   Continuous Glucose Sensor (DEXCOM G7 SENSOR) MISC    gabapentin  (NEURONTIN ) 300 MG capsule    Continuous Glucose Receiver (DEXCOM G7 RECEIVER) DEVI      Physical Exam:    07/26/2024    8:13 AM 07/19/2024    8:27 AM 07/05/2024    1:38 PM  Vitals with BMI  Height 5' 6     Weight 167 lbs 3 oz 165 lbs   BMI 27    Systolic 122 128 858  Diastolic 70 78 85  Pulse 73 66 66  Vital signs reviewed.  Nursing notes reviewed. Weight trend reviewed. Physical Exam General Appearance:  No acute distress appreciable.   Well-groomed, healthy-appearing male.  Well proportioned with no abnormal fat distribution.  Good muscle tone. Pulmonary:  Normal work of breathing at rest, no respiratory distress apparent. SpO2: 98 %  Musculoskeletal: All extremities are intact.  Neurological:  Awake, alert, oriented, and engaged.  No obvious focal neurological deficits or cognitive impairments.  Sensorium seems unclouded.   Speech is clear and coherent with logical content. Psychiatric:  Appropriate mood, pleasant and cooperative demeanor, thoughtful and engaged during the exam   Diabetic foot exam was performed with the following findings:   No deformities, ulcerations, or other skin breakdown Normal sensation of 10g monofilament Intact posterior tibialis and dorsalis pedis pulses Physical Exam EXTREMITIES: Good blood flow and sensation. SKIN: No skin breakdown. Mild fungal infection at the tips of the toes.  Diabetic Foot Exam: Appearance - no lesions, ulcers or calluses Skin - no sigificant pallor or erythema Monofilament testing - sensitive bilaterally in following locations:  Right - Great toe, medial, central, lateral ball and posterior foot intact  Left - Great toe, medial, central, lateral ball and posterior foot intact Pulses - +2 distally bilaterally       Results:    07/19/2024    9:48 AM 06/24/2024    8:04 AM 03/21/2024    8:44 AM 02/22/2024    8:54 AM  PHQ 2/9 Scores  PHQ - 2 Score 0 0 0 0   Results LABS   Continuous glucose monitoring: Postprandial glucose levels: 145 mg/dL, 859 mg/dL, 851 mg/dL; Preprandial glucose levels: 138 mg/dL, 859 mg/dL    No results found for any visits on 07/26/24. Appointment on 07/19/2024  Component Date Value Ref Range  Status   WBC Count 07/19/2024 6.4  4.0 - 10.5 K/uL Final   RBC 07/19/2024 5.37  4.22 - 5.81 MIL/uL Final   Hemoglobin 07/19/2024 14.5  13.0 - 17.0 g/dL Final   HCT 92/77/7974 42.3  39.0 - 52.0 % Final   MCV 07/19/2024 78.8 (L)  80.0 - 100.0 fL Final   MCH 07/19/2024 27.0  26.0 - 34.0 pg Final   MCHC 07/19/2024 34.3  30.0 - 36.0 g/dL Final   RDW 92/77/7974 15.9 (H)  11.5 - 15.5 % Final   Platelet Count 07/19/2024 163  150 - 400 K/uL Final   nRBC 07/19/2024 0.0  0.0 - 0.2 % Final   Neutrophils Relative % 07/19/2024 41  % Final   Neutro Abs 07/19/2024 2.6  1.7 - 7.7 K/uL Final   Lymphocytes Relative 07/19/2024 43  % Final   Lymphs Abs 07/19/2024 2.8  0.7 - 4.0 K/uL Final   Monocytes Relative 07/19/2024 10  % Final   Monocytes Absolute 07/19/2024 0.6  0.1 - 1.0 K/uL Final   Eosinophils Relative 07/19/2024 5  % Final   Eosinophils Absolute 07/19/2024 0.3  0.0 - 0.5 K/uL Final   Basophils Relative 07/19/2024 1  % Final   Basophils Absolute 07/19/2024 0.0  0.0 - 0.1 K/uL Final   Immature Granulocytes 07/19/2024 0  % Final   Abs Immature Granulocytes 07/19/2024 0.02  0.00 - 0.07 K/uL Final   Sodium 07/19/2024 140  135 - 145 mmol/L Final   Potassium 07/19/2024 3.7  3.5 - 5.1 mmol/L Final   Chloride 07/19/2024 106  98 - 111 mmol/L Final   CO2 07/19/2024 26  22 - 32 mmol/L Final   Glucose, Bld 07/19/2024 145 (H)  70 - 99 mg/dL Final   BUN 92/77/7974 11  6 - 20 mg/dL Final   Creatinine, Ser 07/19/2024 0.75  0.61 - 1.24 mg/dL Final   Calcium  07/19/2024 8.6 (L)  8.9 - 10.3 mg/dL Final   Total Protein 92/77/7974 6.1 (L)  6.5 - 8.1 g/dL Final   Albumin  07/19/2024 3.7  3.5 - 5.0 g/dL Final   AST 92/77/7974 14 (L)  15 - 41 U/L Final   ALT 07/19/2024 14  0 - 44 U/L Final   Alkaline Phosphatase 07/19/2024 49  38 - 126 U/L Final   Total Bilirubin 07/19/2024 0.7  0.0 - 1.2 mg/dL Final   GFR, Estimated 07/19/2024 >60  >60 mL/min Final   Anion gap 07/19/2024 8  5 - 15 Final   IgG (Immunoglobin G),  Serum 07/19/2024 654  603 - 1,613 mg/dL Final   IgA 92/77/7974 133  90 - 386 mg/dL Final   IgM (Immunoglobulin M), Srm 07/19/2024 21  20 - 172 mg/dL Final   Total Protein ELP 07/19/2024 5.7 (L)  6.0 - 8.5 g/dL Corrected   Albumin  SerPl Elph-Mcnc 07/19/2024 3.3  2.9 -  4.4 g/dL Corrected   Alpha 1 92/77/7974 0.1  0.0 - 0.4 g/dL Corrected   Alpha2 Glob SerPl Elph-Mcnc 07/19/2024 0.9  0.4 - 1.0 g/dL Corrected   B-Globulin SerPl Elph-Mcnc 07/19/2024 0.7  0.7 - 1.3 g/dL Corrected   Gamma Glob SerPl Elph-Mcnc 07/19/2024 0.7  0.4 - 1.8 g/dL Corrected   M Protein SerPl Elph-Mcnc 07/19/2024 0.3 (H)  Not Observed g/dL Corrected   Globulin, Total 07/19/2024 2.4  2.2 - 3.9 g/dL Corrected   Albumin /Glob SerPl 07/19/2024 1.4  0.7 - 1.7 Corrected   IFE 1 07/19/2024 Comment (A)   Corrected   Please Note 07/19/2024 Comment   Corrected  Appointment on 07/05/2024  Component Date Value Ref Range Status   WBC Count 07/05/2024 7.0  4.0 - 10.5 K/uL Final   RBC 07/05/2024 5.42  4.22 - 5.81 MIL/uL Final   Hemoglobin 07/05/2024 14.5  13.0 - 17.0 g/dL Final   HCT 92/91/7974 42.9  39.0 - 52.0 % Final   MCV 07/05/2024 79.2 (L)  80.0 - 100.0 fL Final   MCH 07/05/2024 26.8  26.0 - 34.0 pg Final   MCHC 07/05/2024 33.8  30.0 - 36.0 g/dL Final   RDW 92/91/7974 15.9 (H)  11.5 - 15.5 % Final   Platelet Count 07/05/2024 164  150 - 400 K/uL Final   nRBC 07/05/2024 0.0  0.0 - 0.2 % Final   Neutrophils Relative % 07/05/2024 45  % Final   Neutro Abs 07/05/2024 3.2  1.7 - 7.7 K/uL Final   Lymphocytes Relative 07/05/2024 43  % Final   Lymphs Abs 07/05/2024 3.0  0.7 - 4.0 K/uL Final   Monocytes Relative 07/05/2024 7  % Final   Monocytes Absolute 07/05/2024 0.5  0.1 - 1.0 K/uL Final   Eosinophils Relative 07/05/2024 4  % Final   Eosinophils Absolute 07/05/2024 0.3  0.0 - 0.5 K/uL Final   Basophils Relative 07/05/2024 1  % Final   Basophils Absolute 07/05/2024 0.0  0.0 - 0.1 K/uL Final   Immature Granulocytes 07/05/2024 0  %  Final   Abs Immature Granulocytes 07/05/2024 0.02  0.00 - 0.07 K/uL Final   Sodium 07/05/2024 139  135 - 145 mmol/L Final   Potassium 07/05/2024 3.4 (L)  3.5 - 5.1 mmol/L Final   Chloride 07/05/2024 105  98 - 111 mmol/L Final   CO2 07/05/2024 26  22 - 32 mmol/L Final   Glucose, Bld 07/05/2024 139 (H)  70 - 99 mg/dL Final   BUN 92/91/7974 14  6 - 20 mg/dL Final   Creatinine, Ser 07/05/2024 0.77  0.61 - 1.24 mg/dL Final   Calcium  07/05/2024 8.6 (L)  8.9 - 10.3 mg/dL Final   Total Protein 92/91/7974 5.9 (L)  6.5 - 8.1 g/dL Final   Albumin  07/05/2024 3.8  3.5 - 5.0 g/dL Final   AST 92/91/7974 13 (L)  15 - 41 U/L Final   ALT 07/05/2024 18  0 - 44 U/L Final   Alkaline Phosphatase 07/05/2024 57  38 - 126 U/L Final   Total Bilirubin 07/05/2024 0.8  0.0 - 1.2 mg/dL Final   GFR, Estimated 07/05/2024 >60  >60 mL/min Final   Anion gap 07/05/2024 8  5 - 15 Final   IgG (Immunoglobin G), Serum 07/05/2024 664  603 - 1,613 mg/dL Final   IgA 92/91/7974 139  90 - 386 mg/dL Final   IgM (Immunoglobulin M), Srm 07/05/2024 21  20 - 172 mg/dL Final   Total Protein ELP 07/05/2024 5.8 (L)  6.0 -  8.5 g/dL Corrected   Albumin  SerPl Elph-Mcnc 07/05/2024 3.6  2.9 - 4.4 g/dL Corrected   Alpha 1 92/91/7974 0.1  0.0 - 0.4 g/dL Corrected   Alpha2 Glob SerPl Elph-Mcnc 07/05/2024 0.8  0.4 - 1.0 g/dL Corrected   B-Globulin SerPl Elph-Mcnc 07/05/2024 0.8  0.7 - 1.3 g/dL Corrected   Gamma Glob SerPl Elph-Mcnc 07/05/2024 0.6  0.4 - 1.8 g/dL Corrected   M Protein SerPl Elph-Mcnc 07/05/2024 0.3 (H)  Not Observed g/dL Corrected   Globulin, Total 07/05/2024 2.2  2.2 - 3.9 g/dL Corrected   Albumin /Glob SerPl 07/05/2024 1.7  0.7 - 1.7 Corrected   IFE 1 07/05/2024 Comment (A)   Corrected   Please Note 07/05/2024 Comment   Corrected  Office Visit on 06/24/2024  Component Date Value Ref Range Status   Magnesium 06/24/2024 1.8  1.5 - 2.5 mg/dL Final   VITD 93/72/7974 16.19 (L)  30.00 - 100.00 ng/mL Final   Microalb, Ur 06/24/2024  <0.7  mg/dL Final   Creatinine,U 93/72/7974 79.0  mg/dL Final   Microalb Creat Ratio 06/24/2024 Unable to calculate  0.0 - 30.0 mg/g Final   Hgb A1c MFr Bld 06/24/2024 7.1 (H)  4.6 - 6.5 % Final  Appointment on 06/10/2024  Component Date Value Ref Range Status   WBC Count 06/10/2024 6.5  4.0 - 10.5 K/uL Final   RBC 06/10/2024 5.12  4.22 - 5.81 MIL/uL Final   Hemoglobin 06/10/2024 13.5  13.0 - 17.0 g/dL Final   HCT 93/86/7974 39.9  39.0 - 52.0 % Final   MCV 06/10/2024 77.9 (L)  80.0 - 100.0 fL Final   MCH 06/10/2024 26.4  26.0 - 34.0 pg Final   MCHC 06/10/2024 33.8  30.0 - 36.0 g/dL Final   RDW 93/86/7974 15.7 (H)  11.5 - 15.5 % Final   Platelet Count 06/10/2024 154  150 - 400 K/uL Final   nRBC 06/10/2024 0.0  0.0 - 0.2 % Final   Neutrophils Relative % 06/10/2024 43  % Final   Neutro Abs 06/10/2024 2.9  1.7 - 7.7 K/uL Final   Lymphocytes Relative 06/10/2024 35  % Final   Lymphs Abs 06/10/2024 2.2  0.7 - 4.0 K/uL Final   Monocytes Relative 06/10/2024 10  % Final   Monocytes Absolute 06/10/2024 0.7  0.1 - 1.0 K/uL Final   Eosinophils Relative 06/10/2024 10  % Final   Eosinophils Absolute 06/10/2024 0.6 (H)  0.0 - 0.5 K/uL Final   Basophils Relative 06/10/2024 2  % Final   Basophils Absolute 06/10/2024 0.1  0.0 - 0.1 K/uL Final   Immature Granulocytes 06/10/2024 0  % Final   Abs Immature Granulocytes 06/10/2024 0.01  0.00 - 0.07 K/uL Final   Sodium 06/10/2024 141  135 - 145 mmol/L Final   Potassium 06/10/2024 3.1 (L)  3.5 - 5.1 mmol/L Final   Chloride 06/10/2024 111  98 - 111 mmol/L Final   CO2 06/10/2024 25  22 - 32 mmol/L Final   Glucose, Bld 06/10/2024 153 (H)  70 - 99 mg/dL Final   BUN 93/86/7974 14  6 - 20 mg/dL Final   Creatinine, Ser 06/10/2024 0.68  0.61 - 1.24 mg/dL Final   Calcium  06/10/2024 8.2 (L)  8.9 - 10.3 mg/dL Final   Total Protein 93/86/7974 5.9 (L)  6.5 - 8.1 g/dL Final   Albumin  06/10/2024 3.8  3.5 - 5.0 g/dL Final   AST 93/86/7974 17  15 - 41 U/L Final   ALT  06/10/2024 27  0 - 44  U/L Final   Alkaline Phosphatase 06/10/2024 56  38 - 126 U/L Final   Total Bilirubin 06/10/2024 0.7  0.0 - 1.2 mg/dL Final   GFR, Estimated 06/10/2024 >60  >60 mL/min Final   Anion gap 06/10/2024 5  5 - 15 Final   IgG (Immunoglobin G), Serum 06/10/2024 570 (L)  603 - 1,613 mg/dL Final   IgA 93/86/7974 133  90 - 386 mg/dL Final   IgM (Immunoglobulin M), Srm 06/10/2024 15 (L)  20 - 172 mg/dL Final   Total Protein ELP 06/10/2024 5.6 (L)  6.0 - 8.5 g/dL Corrected   Albumin  SerPl Elph-Mcnc 06/10/2024 3.2  2.9 - 4.4 g/dL Corrected   Alpha 1 93/86/7974 0.1  0.0 - 0.4 g/dL Corrected   Alpha2 Glob SerPl Elph-Mcnc 06/10/2024 0.9  0.4 - 1.0 g/dL Corrected   B-Globulin SerPl Elph-Mcnc 06/10/2024 0.9  0.7 - 1.3 g/dL Corrected   Gamma Glob SerPl Elph-Mcnc 06/10/2024 0.6  0.4 - 1.8 g/dL Corrected   M Protein SerPl Elph-Mcnc 06/10/2024 0.2 (H)  Not Observed g/dL Corrected   Globulin, Total 06/10/2024 2.4  2.2 - 3.9 g/dL Corrected   Albumin /Glob SerPl 06/10/2024 1.4  0.7 - 1.7 Corrected   IFE 1 06/10/2024 Comment (A)   Corrected   Please Note 06/10/2024 Comment   Corrected  Appointment on 06/03/2024  Component Date Value Ref Range Status   WBC Count 06/03/2024 5.6  4.0 - 10.5 K/uL Final   RBC 06/03/2024 5.15  4.22 - 5.81 MIL/uL Final   Hemoglobin 06/03/2024 13.7  13.0 - 17.0 g/dL Final   HCT 93/93/7974 40.2  39.0 - 52.0 % Final   MCV 06/03/2024 78.1 (L)  80.0 - 100.0 fL Final   MCH 06/03/2024 26.6  26.0 - 34.0 pg Final   MCHC 06/03/2024 34.1  30.0 - 36.0 g/dL Final   RDW 93/93/7974 15.8 (H)  11.5 - 15.5 % Final   Platelet Count 06/03/2024 148 (L)  150 - 400 K/uL Final   nRBC 06/03/2024 0.0  0.0 - 0.2 % Final   Neutrophils Relative % 06/03/2024 34  % Final   Neutro Abs 06/03/2024 1.9  1.7 - 7.7 K/uL Final   Lymphocytes Relative 06/03/2024 43  % Final   Lymphs Abs 06/03/2024 2.4  0.7 - 4.0 K/uL Final   Monocytes Relative 06/03/2024 12  % Final   Monocytes Absolute 06/03/2024  0.7  0.1 - 1.0 K/uL Final   Eosinophils Relative 06/03/2024 10  % Final   Eosinophils Absolute 06/03/2024 0.5  0.0 - 0.5 K/uL Final   Basophils Relative 06/03/2024 1  % Final   Basophils Absolute 06/03/2024 0.1  0.0 - 0.1 K/uL Final   Immature Granulocytes 06/03/2024 0  % Final   Abs Immature Granulocytes 06/03/2024 0.01  0.00 - 0.07 K/uL Final   Sodium 06/03/2024 142  135 - 145 mmol/L Final   Potassium 06/03/2024 3.3 (L)  3.5 - 5.1 mmol/L Final   Chloride 06/03/2024 110  98 - 111 mmol/L Final   CO2 06/03/2024 25  22 - 32 mmol/L Final   Glucose, Bld 06/03/2024 147 (H)  70 - 99 mg/dL Final   BUN 93/93/7974 14  6 - 20 mg/dL Final   Creatinine, Ser 06/03/2024 0.80  0.61 - 1.24 mg/dL Final   Calcium  06/03/2024 8.5 (L)  8.9 - 10.3 mg/dL Final   Total Protein 93/93/7974 5.8 (L)  6.5 - 8.1 g/dL Final   Albumin  06/03/2024 3.7  3.5 - 5.0 g/dL Final   AST 93/93/7974  16  15 - 41 U/L Final   ALT 06/03/2024 24  0 - 44 U/L Final   Alkaline Phosphatase 06/03/2024 44  38 - 126 U/L Final   Total Bilirubin 06/03/2024 0.7  0.0 - 1.2 mg/dL Final   GFR, Estimated 06/03/2024 >60  >60 mL/min Final   Anion gap 06/03/2024 7  5 - 15 Final   IgG (Immunoglobin G), Serum 06/03/2024 579 (L)  603 - 1,613 mg/dL Final   IgA 93/93/7974 127  90 - 386 mg/dL Final   IgM (Immunoglobulin M), Srm 06/03/2024 15 (L)  20 - 172 mg/dL Final   Total Protein ELP 06/03/2024 5.6 (L)  6.0 - 8.5 g/dL Corrected   Albumin  SerPl Elph-Mcnc 06/03/2024 3.4  2.9 - 4.4 g/dL Corrected   Alpha 1 93/93/7974 0.1  0.0 - 0.4 g/dL Corrected   Alpha2 Glob SerPl Elph-Mcnc 06/03/2024 0.7  0.4 - 1.0 g/dL Corrected   B-Globulin SerPl Elph-Mcnc 06/03/2024 0.8  0.7 - 1.3 g/dL Corrected   Gamma Glob SerPl Elph-Mcnc 06/03/2024 0.6  0.4 - 1.8 g/dL Corrected   M Protein SerPl Elph-Mcnc 06/03/2024 0.3 (H)  Not Observed g/dL Corrected   Globulin, Total 06/03/2024 2.2  2.2 - 3.9 g/dL Corrected   Albumin /Glob SerPl 06/03/2024 1.6  0.7 - 1.7 Corrected   IFE 1  06/03/2024 Comment (A)   Corrected   Please Note 06/03/2024 Comment   Corrected  Appointment on 05/24/2024  Component Date Value Ref Range Status   Microalb, Ur 05/24/2024 2.7 (H)  0.0 - 1.9 mg/dL Final   Creatinine,U 94/72/7974 98.6  mg/dL Final   Microalb Creat Ratio 05/24/2024 27.4  0.0 - 30.0 mg/g Final  Appointment on 05/13/2024  Component Date Value Ref Range Status   IgG (Immunoglobin G), Serum 05/13/2024 600 (L)  603 - 1,613 mg/dL Final   IgA 94/83/7974 148  90 - 386 mg/dL Final   IgM (Immunoglobulin M), Srm 05/13/2024 22  20 - 172 mg/dL Final   Total Protein ELP 05/13/2024 5.5 (L)  6.0 - 8.5 g/dL Corrected   Albumin  SerPl Elph-Mcnc 05/13/2024 3.2  2.9 - 4.4 g/dL Corrected   Alpha 1 94/83/7974 0.2  0.0 - 0.4 g/dL Corrected   Alpha2 Glob SerPl Elph-Mcnc 05/13/2024 0.7  0.4 - 1.0 g/dL Corrected   B-Globulin SerPl Elph-Mcnc 05/13/2024 0.8  0.7 - 1.3 g/dL Corrected   Gamma Glob SerPl Elph-Mcnc 05/13/2024 0.6  0.4 - 1.8 g/dL Corrected   M Protein SerPl Elph-Mcnc 05/13/2024 0.2 (H)  Not Observed g/dL Corrected   Globulin, Total 05/13/2024 2.3  2.2 - 3.9 g/dL Corrected   Albumin /Glob SerPl 05/13/2024 1.4  0.7 - 1.7 Corrected   IFE 1 05/13/2024 Comment (A)   Corrected   Please Note 05/13/2024 Comment   Corrected   Sodium 05/13/2024 141  135 - 145 mmol/L Final   Potassium 05/13/2024 3.2 (L)  3.5 - 5.1 mmol/L Final   Chloride 05/13/2024 110  98 - 111 mmol/L Final   CO2 05/13/2024 27  22 - 32 mmol/L Final   Glucose, Bld 05/13/2024 147 (H)  70 - 99 mg/dL Final   BUN 94/83/7974 14  6 - 20 mg/dL Final   Creatinine, Ser 05/13/2024 0.71  0.61 - 1.24 mg/dL Final   Calcium  05/13/2024 8.8 (L)  8.9 - 10.3 mg/dL Final   Total Protein 94/83/7974 5.9 (L)  6.5 - 8.1 g/dL Final   Albumin  05/13/2024 3.7  3.5 - 5.0 g/dL Final   AST 94/83/7974 14 (L)  15 - 41  U/L Final   ALT 05/13/2024 14  0 - 44 U/L Final   Alkaline Phosphatase 05/13/2024 46  38 - 126 U/L Final   Total Bilirubin 05/13/2024 0.6  0.0  - 1.2 mg/dL Final   GFR, Estimated 05/13/2024 >60  >60 mL/min Final   Anion gap 05/13/2024 4 (L)  5 - 15 Final   WBC Count 05/13/2024 6.2  4.0 - 10.5 K/uL Final   RBC 05/13/2024 5.27  4.22 - 5.81 MIL/uL Final   Hemoglobin 05/13/2024 13.9  13.0 - 17.0 g/dL Final   HCT 94/83/7974 41.1  39.0 - 52.0 % Final   MCV 05/13/2024 78.0 (L)  80.0 - 100.0 fL Final   MCH 05/13/2024 26.4  26.0 - 34.0 pg Final   MCHC 05/13/2024 33.8  30.0 - 36.0 g/dL Final   RDW 94/83/7974 15.1  11.5 - 15.5 % Final   Platelet Count 05/13/2024 208  150 - 400 K/uL Final   nRBC 05/13/2024 0.0  0.0 - 0.2 % Final   Neutrophils Relative % 05/13/2024 46  % Final   Neutro Abs 05/13/2024 2.9  1.7 - 7.7 K/uL Final   Lymphocytes Relative 05/13/2024 37  % Final   Lymphs Abs 05/13/2024 2.3  0.7 - 4.0 K/uL Final   Monocytes Relative 05/13/2024 9  % Final   Monocytes Absolute 05/13/2024 0.6  0.1 - 1.0 K/uL Final   Eosinophils Relative 05/13/2024 5  % Final   Eosinophils Absolute 05/13/2024 0.3  0.0 - 0.5 K/uL Final   Basophils Relative 05/13/2024 3  % Final   Basophils Absolute 05/13/2024 0.2 (H)  0.0 - 0.1 K/uL Final   Immature Granulocytes 05/13/2024 0  % Final   Abs Immature Granulocytes 05/13/2024 0.02  0.00 - 0.07 K/uL Final  Appointment on 04/29/2024  Component Date Value Ref Range Status   WBC Count 04/29/2024 5.0  4.0 - 10.5 K/uL Final   RBC 04/29/2024 5.36  4.22 - 5.81 MIL/uL Final   Hemoglobin 04/29/2024 14.5  13.0 - 17.0 g/dL Final   HCT 94/97/7974 42.2  39.0 - 52.0 % Final   MCV 04/29/2024 78.7 (L)  80.0 - 100.0 fL Final   MCH 04/29/2024 27.1  26.0 - 34.0 pg Final   MCHC 04/29/2024 34.4  30.0 - 36.0 g/dL Final   RDW 94/97/7974 15.1  11.5 - 15.5 % Final   Platelet Count 04/29/2024 143 (L)  150 - 400 K/uL Final   nRBC 04/29/2024 0.0  0.0 - 0.2 % Final   Neutrophils Relative % 04/29/2024 40  % Final   Neutro Abs 04/29/2024 2.0  1.7 - 7.7 K/uL Final   Lymphocytes Relative 04/29/2024 37  % Final   Lymphs Abs  04/29/2024 1.9  0.7 - 4.0 K/uL Final   Monocytes Relative 04/29/2024 14  % Final   Monocytes Absolute 04/29/2024 0.7  0.1 - 1.0 K/uL Final   Eosinophils Relative 04/29/2024 7  % Final   Eosinophils Absolute 04/29/2024 0.3  0.0 - 0.5 K/uL Final   Basophils Relative 04/29/2024 2  % Final   Basophils Absolute 04/29/2024 0.1  0.0 - 0.1 K/uL Final   Immature Granulocytes 04/29/2024 0  % Final   Abs Immature Granulocytes 04/29/2024 0.01  0.00 - 0.07 K/uL Final   Sodium 04/29/2024 141  135 - 145 mmol/L Final   Potassium 04/29/2024 3.0 (L)  3.5 - 5.1 mmol/L Final   Chloride 04/29/2024 109  98 - 111 mmol/L Final   CO2 04/29/2024 27  22 - 32 mmol/L  Final   Glucose, Bld 04/29/2024 139 (H)  70 - 99 mg/dL Final   BUN 94/97/7974 13  6 - 20 mg/dL Final   Creatinine, Ser 04/29/2024 0.72  0.61 - 1.24 mg/dL Final   Calcium  04/29/2024 8.4 (L)  8.9 - 10.3 mg/dL Final   Total Protein 94/97/7974 6.0 (L)  6.5 - 8.1 g/dL Final   Albumin  04/29/2024 4.0  3.5 - 5.0 g/dL Final   AST 94/97/7974 18  15 - 41 U/L Final   ALT 04/29/2024 27  0 - 44 U/L Final   Alkaline Phosphatase 04/29/2024 51  38 - 126 U/L Final   Total Bilirubin 04/29/2024 0.6  0.0 - 1.2 mg/dL Final   GFR, Estimated 04/29/2024 >60  >60 mL/min Final   Anion gap 04/29/2024 5  5 - 15 Final   IgG (Immunoglobin G), Serum 04/29/2024 659  603 - 1,613 mg/dL Final   IgA 94/97/7974 142  90 - 386 mg/dL Final   IgM (Immunoglobulin M), Srm 04/29/2024 19 (L)  20 - 172 mg/dL Final   Total Protein ELP 04/29/2024 5.6 (L)  6.0 - 8.5 g/dL Corrected   Albumin  SerPl Elph-Mcnc 04/29/2024 3.4  2.9 - 4.4 g/dL Corrected   Alpha 1 94/97/7974 0.1  0.0 - 0.4 g/dL Corrected   Alpha2 Glob SerPl Elph-Mcnc 04/29/2024 0.7  0.4 - 1.0 g/dL Corrected   B-Globulin SerPl Elph-Mcnc 04/29/2024 0.8  0.7 - 1.3 g/dL Corrected   Gamma Glob SerPl Elph-Mcnc 04/29/2024 0.6  0.4 - 1.8 g/dL Corrected   M Protein SerPl Elph-Mcnc 04/29/2024 0.3 (H)  Not Observed g/dL Corrected   Globulin, Total  04/29/2024 2.2  2.2 - 3.9 g/dL Corrected   Albumin /Glob SerPl 04/29/2024 1.6  0.7 - 1.7 Corrected   IFE 1 04/29/2024 Comment (A)   Corrected   Please Note 04/29/2024 Comment   Corrected  Appointment on 04/15/2024  Component Date Value Ref Range Status   IgG (Immunoglobin G), Serum 04/15/2024 647  603 - 1,613 mg/dL Final   IgA 95/81/7974 137  90 - 386 mg/dL Final   IgM (Immunoglobulin M), Srm 04/15/2024 20  20 - 172 mg/dL Final   Total Protein ELP 04/15/2024 5.6 (L)  6.0 - 8.5 g/dL Corrected   Albumin  SerPl Elph-Mcnc 04/15/2024 3.4  2.9 - 4.4 g/dL Corrected   Alpha 1 95/81/7974 0.1  0.0 - 0.4 g/dL Corrected   Alpha2 Glob SerPl Elph-Mcnc 04/15/2024 0.7  0.4 - 1.0 g/dL Corrected   B-Globulin SerPl Elph-Mcnc 04/15/2024 0.7  0.7 - 1.3 g/dL Corrected   Gamma Glob SerPl Elph-Mcnc 04/15/2024 0.6  0.4 - 1.8 g/dL Corrected   M Protein SerPl Elph-Mcnc 04/15/2024 0.3 (H)  Not Observed g/dL Corrected   Globulin, Total 04/15/2024 2.2  2.2 - 3.9 g/dL Corrected   Albumin /Glob SerPl 04/15/2024 1.6  0.7 - 1.7 Corrected   IFE 1 04/15/2024 Comment (A)   Corrected   Please Note 04/15/2024 Comment   Corrected   Sodium 04/15/2024 140  135 - 145 mmol/L Final   Potassium 04/15/2024 3.3 (L)  3.5 - 5.1 mmol/L Final   Chloride 04/15/2024 109  98 - 111 mmol/L Final   CO2 04/15/2024 24  22 - 32 mmol/L Final   Glucose, Bld 04/15/2024 169 (H)  70 - 99 mg/dL Final   BUN 95/81/7974 12  6 - 20 mg/dL Final   Creatinine, Ser 04/15/2024 0.69  0.61 - 1.24 mg/dL Final   Calcium  04/15/2024 8.3 (L)  8.9 - 10.3 mg/dL Final   Total Protein  04/15/2024 5.9 (L)  6.5 - 8.1 g/dL Final   Albumin  04/15/2024 3.9  3.5 - 5.0 g/dL Final   AST 95/81/7974 16  15 - 41 U/L Final   ALT 04/15/2024 15  0 - 44 U/L Final   Alkaline Phosphatase 04/15/2024 44  38 - 126 U/L Final   Total Bilirubin 04/15/2024 0.5  0.0 - 1.2 mg/dL Final   GFR, Estimated 04/15/2024 >60  >60 mL/min Final   Anion gap 04/15/2024 7  5 - 15 Final   WBC Count 04/15/2024  5.0  4.0 - 10.5 K/uL Final   RBC 04/15/2024 5.25  4.22 - 5.81 MIL/uL Final   Hemoglobin 04/15/2024 14.1  13.0 - 17.0 g/dL Final   HCT 95/81/7974 41.4  39.0 - 52.0 % Final   MCV 04/15/2024 78.9 (L)  80.0 - 100.0 fL Final   MCH 04/15/2024 26.9  26.0 - 34.0 pg Final   MCHC 04/15/2024 34.1  30.0 - 36.0 g/dL Final   RDW 95/81/7974 14.9  11.5 - 15.5 % Final   Platelet Count 04/15/2024 201  150 - 400 K/uL Final   nRBC 04/15/2024 0.0  0.0 - 0.2 % Final   Neutrophils Relative % 04/15/2024 35  % Final   Neutro Abs 04/15/2024 1.8  1.7 - 7.7 K/uL Final   Lymphocytes Relative 04/15/2024 47  % Final   Lymphs Abs 04/15/2024 2.3  0.7 - 4.0 K/uL Final   Monocytes Relative 04/15/2024 11  % Final   Monocytes Absolute 04/15/2024 0.6  0.1 - 1.0 K/uL Final   Eosinophils Relative 04/15/2024 6  % Final   Eosinophils Absolute 04/15/2024 0.3  0.0 - 0.5 K/uL Final   Basophils Relative 04/15/2024 1  % Final   Basophils Absolute 04/15/2024 0.1  0.0 - 0.1 K/uL Final   Immature Granulocytes 04/15/2024 0  % Final   Abs Immature Granulocytes 04/15/2024 0.01  0.00 - 0.07 K/uL Final  Appointment on 03/25/2024  Component Date Value Ref Range Status   WBC Count 03/25/2024 5.9  4.0 - 10.5 K/uL Final   RBC 03/25/2024 5.34  4.22 - 5.81 MIL/uL Final   Hemoglobin 03/25/2024 14.3  13.0 - 17.0 g/dL Final   HCT 96/71/7974 43.2  39.0 - 52.0 % Final   MCV 03/25/2024 80.9  80.0 - 100.0 fL Final   MCH 03/25/2024 26.8  26.0 - 34.0 pg Final   MCHC 03/25/2024 33.1  30.0 - 36.0 g/dL Final   RDW 96/71/7974 15.5  11.5 - 15.5 % Final   Platelet Count 03/25/2024 144 (L)  150 - 400 K/uL Final   nRBC 03/25/2024 0.0  0.0 - 0.2 % Final   Neutrophils Relative % 03/25/2024 49  % Final   Neutro Abs 03/25/2024 2.9  1.7 - 7.7 K/uL Final   Lymphocytes Relative 03/25/2024 32  % Final   Lymphs Abs 03/25/2024 1.9  0.7 - 4.0 K/uL Final   Monocytes Relative 03/25/2024 7  % Final   Monocytes Absolute 03/25/2024 0.4  0.1 - 1.0 K/uL Final    Eosinophils Relative 03/25/2024 10  % Final   Eosinophils Absolute 03/25/2024 0.6 (H)  0.0 - 0.5 K/uL Final   Basophils Relative 03/25/2024 2  % Final   Basophils Absolute 03/25/2024 0.1  0.0 - 0.1 K/uL Final   Immature Granulocytes 03/25/2024 0  % Final   Abs Immature Granulocytes 03/25/2024 0.01  0.00 - 0.07 K/uL Final   Sodium 03/25/2024 139  135 - 145 mmol/L Final   Potassium 03/25/2024 3.4 (L)  3.5 - 5.1 mmol/L Final   Chloride 03/25/2024 108  98 - 111 mmol/L Final   CO2 03/25/2024 25  22 - 32 mmol/L Final   Glucose, Bld 03/25/2024 172 (H)  70 - 99 mg/dL Final   BUN 96/71/7974 14  6 - 20 mg/dL Final   Creatinine, Ser 03/25/2024 0.77  0.61 - 1.24 mg/dL Final   Calcium  03/25/2024 8.8 (L)  8.9 - 10.3 mg/dL Final   Total Protein 96/71/7974 6.2 (L)  6.5 - 8.1 g/dL Final   Albumin  03/25/2024 4.0  3.5 - 5.0 g/dL Final   AST 96/71/7974 13 (L)  15 - 41 U/L Final   ALT 03/25/2024 11  0 - 44 U/L Final   Alkaline Phosphatase 03/25/2024 51  38 - 126 U/L Final   Total Bilirubin 03/25/2024 0.8  0.0 - 1.2 mg/dL Final   GFR, Estimated 03/25/2024 >60  >60 mL/min Final   Anion gap 03/25/2024 6  5 - 15 Final   IgG (Immunoglobin G), Serum 03/25/2024 742  603 - 1,613 mg/dL Final   IgA 96/71/7974 137  90 - 386 mg/dL Final   IgM (Immunoglobulin M), Srm 03/25/2024 21  20 - 172 mg/dL Final   Total Protein ELP 03/25/2024 6.0  6.0 - 8.5 g/dL Corrected   Albumin  SerPl Elph-Mcnc 03/25/2024 3.4  2.9 - 4.4 g/dL Corrected   Alpha 1 96/71/7974 0.2  0.0 - 0.4 g/dL Corrected   Alpha2 Glob SerPl Elph-Mcnc 03/25/2024 0.9  0.4 - 1.0 g/dL Corrected   B-Globulin SerPl Elph-Mcnc 03/25/2024 0.9  0.7 - 1.3 g/dL Corrected   Gamma Glob SerPl Elph-Mcnc 03/25/2024 0.7  0.4 - 1.8 g/dL Corrected   M Protein SerPl Elph-Mcnc 03/25/2024 0.3 (H)  Not Observed g/dL Corrected   Globulin, Total 03/25/2024 2.6  2.2 - 3.9 g/dL Corrected   Albumin /Glob SerPl 03/25/2024 1.4  0.7 - 1.7 Corrected   IFE 1 03/25/2024 Comment (A)   Corrected    Please Note 03/25/2024 Comment   Corrected  There may be more visits with results that are not included.  No image results found. No results found.       ASSESSMENT & PLAN   Assessment & Plan Multiple myeloma in remission [facility]) Recent labs: Persistent small IgG lambda M-protein (0.3 g/dL); stable over 3 months Immunofixation: IgG lambda, also IgG kappa detected (possible therapy interference) SPEP: Mild hypogammaglobulinemia, low total protein, low albumin  CBC: Stable, mild microcytosis, high RDW, no cytopenias Calcium : Mildly low, stable Key Issues: No evidence of progression/relapse (labs stable, no cytopenias, no hypercalcemia) Continue surveillance; reviewed for new symptoms (bone pain, infections, fatigue) -he has none Confirm compliance with Revlimid  (lenalidomide ) pickup Reviewed for therapy side effects (neuropathy, cytopenias, infection risk)- he is not having any Patient with high-risk IgG lambda multiple myeloma, currently in remission, on lenalidomide  maintenance. Recent labs show stable low-level monoclonal protein and no cytopenias. No new symptoms reported. Continue surveillance and therapy. Microalbuminuria Will order lab testing to guide management. Proteinuria resolved from May to June, and current kidney function is normal. Monitoring for potential kidney damage due to diabetes. Continue Farxiga  and [place] to protect kidney function. Obtain urine sample today to monitor protein levels. Diabetes mellitus with proteinuria (HCC) A1c is close to goal as of June. He faces challenges with glucose sensor usage due to technical issues and language barriers. Previous readings showed postprandial glucose around 145-148 mg/dL and fasting around 861-859 mg/dL. No current nerve pain or complications from diabetes. Order Freestyle Libre 3 Plus glucose monitor for  pickup at [facility] on [address]. Re Refer to an eye doctor for a diabetic eye exam. Perform a foot exam today and  provide cream for foot fungus. Instruct to avoid foods causing glucose readings over 180 mg/dL. Schedule follow-up in two weeks to review new glucose monitor setup. Recent labs: HbA1c: 7.1% (improved, but above target)  Glucose: 139-153 mg/dL fasting we keep trying to get sensor to work but he keeps struggling to use continuous glucose monitor due to language barrier issues with using sensor. Microalbumin/creatinine: Last abnormal (27.4 mg/g), most recent unable to calculate On metformin , dapagliflozin , tirzepatide  Key Issues: Improved but still above target; reinforce adherence, dietary/lifestyle education Monitor for hypoglycemia, especially with CKD Continue SGLT2 inhibitor (dapagliflozin ) for renal protection Review Dexcom/Accu-Chek use, barriers to self-management (language) Reinforce need for annual eye/foot exams (overdue) Assess for neuropathy, foot changes HPI/Assessment stub: Type 2 diabetes with improved but suboptimal glycemic control (HbA1c 7.1%). On metformin , dapagliflozin , tirzepatide . Proteinuria persists. Review self-monitoring, reinforce education, and address overdue eye/foot exams. Idiopathic peripheral neuropathy Current: Off gabapentin  and neuropathy  Attributed to diabetes and/or lenalidomide  Seems to have fully resolve Microcytosis  History of dental problems Multiple dental issues (apical periodontitis, caries, restoration defects) History of loose teeth  Key Issues: Confirm dental follow-up, especially due to myeloma and bisphosphonate risk Assess for dental pain/infection History of dental problems; follow-up with dentistry ongoing. No acute dental complaints. Healthcare maintenance Ophthalmology (never done; risk for retinopathy) Colonoscopy (never done) Pneumococcal, Hep B vaccines (never done) Foot exam (overdue)  Key Issues: Arrange interpreter for all visits Review/arrange overdue screenings and vaccinations Reinforce importance of follow-up  with language-appropriate education Multiple overdue health maintenance items (ophthalmology, colonoscopy, vaccines). Interpreter required; reinforce with translated materials. Onychomycosis Sent ciclopirox  Mild fungal infection noted at the tips of the toenails, likely due to moisture and sugary sweat. No skin breakdown or severe infection observed. Provide antifungal cream for toenail fungus. Language barrier affecting health care Assessment: Patient speaks [place] ([place] dialect) with limited [place] proficiency. Interpreter services utilized effectively for today's visit. Language barrier has previously affected healthcare access, including a missed ophthalmology referral.  Plan:     Continue to arrange in-person interpreter services for all future appointments.     Provide translated written instructions and use pictorial medication guides when available.     Demonstrated use of continuous glucose monitor with interpreter present; will schedule follow-up to review results together.     Avoid virtual appointments when possible to ensure clear communication.     Allocate extra time for future visits to confirm understanding.     Engage family member as support, with patient consent; he agreed to try to use his pastor to help with continuous glucose monitor      Document interpreter use and any additional time spent due to communication needs.     Utilize interpreter assistance for scheduling and care coordination.     ORDER ASSOCIATIONS  #   DIAGNOSIS / CONDITION ICD-10 ENCOUNTER ORDER     ICD-10-CM   1. Multiple myeloma in remission (HCC)  C90.01     2. Microalbuminuria  R80.9     3. Diabetes mellitus with proteinuria (HCC)  E11.29 Continuous Glucose Sensor (FREESTYLE LIBRE 3 PLUS SENSOR) MISC   R80.9 Microalbumin / creatinine urine ratio    Ambulatory referral to Ophthalmology    4. Idiopathic peripheral neuropathy  G60.9     5. Microcytosis  R71.8     6.  History of dental problems  Z87.19  7. Healthcare maintenance  Z00.00     8. Onychomycosis  B35.1 ciclopirox  (LOPROX ) 0.77 % cream    9. Language barrier affecting health care  Z60.3    Z75.8     10. Hypomagnesemia  E83.42     11. Hypokalemia  E87.6     12. Hypocalcemia  E83.51     13. Hypoalbuminemia  E88.09      Meds ordered this encounter  Medications   Continuous Glucose Sensor (FREESTYLE LIBRE 3 PLUS SENSOR) MISC    Sig: Change sensor every 15 days.    Dispense:  2 each    Refill:  11    HGBA1C                   7.1 (H)             06/24/2024                HGBA1C                   7.0 (A)             03/21/2024                HGBA1C                   8.4 (H)             12/09/2023                   GLUCOSE                  145 (H)             07/19/2024 07:   ciclopirox  (LOPROX ) 0.77 % cream    Sig: Apply topically 2 (two) times daily.    Dispense:  90 g    Refill:  3    Orders Placed This Encounter  Procedures   Microalbumin / creatinine urine ratio    [place]   Ambulatory referral to Ophthalmology    Referral Priority:   Routine    Referral Type:   Consultation    Referral Reason:   Specialty Services Required    Requested Specialty:   Ophthalmology    Number of Visits Requested:   1   ED Discharge Orders          Ordered    Continuous Glucose Sensor (FREESTYLE LIBRE 3 PLUS SENSOR) MISC       Note to Pharmacy: HGBA1C                   7.1 (H)             06/24/2024                HGBA1C                   7.0 (A)             03/21/2024                HGBA1C                   8.4 (H)             12/09/2023                   GLUCOSE  145 (H)             07/19/2024 07:   07/26/24 0839    Microalbumin / creatinine urine ratio       Comments: [place]    07/26/24 0854    Ambulatory referral to Ophthalmology        07/26/24 0854    ciclopirox  (LOPROX ) 0.77 % cream  2 times daily        07/26/24 0854              This document was  synthesized by artificial intelligence (Abridge) using HIPAA-compliant recording of the clinical interaction;   We discussed the use of AI scribe software for clinical note transcription with the patient, who gave verbal consent to proceed. additional Info: This encounter employed state-of-the-art, real-time, collaborative documentation. The patient actively reviewed and assisted in updating their electronic medical record on a shared screen, ensuring transparency and facilitating joint problem-solving for the problem list, overview, and plan. This approach promotes accurate, informed care. The treatment plan was discussed and reviewed in detail, including medication safety, potential side effects, and all patient questions. We confirmed understanding and comfort with the plan. Follow-up instructions were established, including contacting the office for any concerns, returning if symptoms worsen, persist, or new symptoms develop, and precautions for potential emergency department visits.

## 2024-07-26 NOTE — Assessment & Plan Note (Addendum)
 Current: Off gabapentin  and neuropathy  Attributed to diabetes and/or lenalidomide  Seems to have fully resolve

## 2024-07-26 NOTE — Assessment & Plan Note (Addendum)
 Assessment: Patient speaks [place] ([place] dialect) with limited [place] proficiency. Interpreter services utilized effectively for today's visit. Language barrier has previously affected healthcare access, including a missed ophthalmology referral.  Plan:     Continue to arrange in-person interpreter services for all future appointments.     Provide translated written instructions and use pictorial medication guides when available.     Demonstrated use of continuous glucose monitor with interpreter present; will schedule follow-up to review results together.     Avoid virtual appointments when possible to ensure clear communication.     Allocate extra time for future visits to confirm understanding.     Engage family member as support, with patient consent; he agreed to try to use his pastor to help with continuous glucose monitor      Document interpreter use and any additional time spent due to communication needs.     Utilize interpreter assistance for scheduling and care coordination.

## 2024-08-02 ENCOUNTER — Ambulatory Visit

## 2024-08-02 ENCOUNTER — Ambulatory Visit: Admitting: Hematology

## 2024-08-02 ENCOUNTER — Other Ambulatory Visit

## 2024-08-09 ENCOUNTER — Ambulatory Visit

## 2024-08-09 ENCOUNTER — Encounter: Payer: Self-pay | Admitting: Hematology

## 2024-08-09 ENCOUNTER — Inpatient Hospital Stay

## 2024-08-09 ENCOUNTER — Inpatient Hospital Stay: Attending: Hematology | Admitting: Hematology

## 2024-08-09 ENCOUNTER — Other Ambulatory Visit

## 2024-08-09 ENCOUNTER — Other Ambulatory Visit: Payer: Self-pay

## 2024-08-09 VITALS — BP 143/87 | HR 68 | Temp 98.2°F | Resp 17 | Ht 66.0 in | Wt 170.8 lb

## 2024-08-09 DIAGNOSIS — C9000 Multiple myeloma not having achieved remission: Secondary | ICD-10-CM | POA: Diagnosis not present

## 2024-08-09 DIAGNOSIS — Z5112 Encounter for antineoplastic immunotherapy: Secondary | ICD-10-CM | POA: Diagnosis present

## 2024-08-09 DIAGNOSIS — Z7984 Long term (current) use of oral hypoglycemic drugs: Secondary | ICD-10-CM | POA: Insufficient documentation

## 2024-08-09 DIAGNOSIS — Z7961 Long term (current) use of immunomodulator: Secondary | ICD-10-CM | POA: Insufficient documentation

## 2024-08-09 DIAGNOSIS — Z7985 Long-term (current) use of injectable non-insulin antidiabetic drugs: Secondary | ICD-10-CM | POA: Insufficient documentation

## 2024-08-09 DIAGNOSIS — Z7189 Other specified counseling: Secondary | ICD-10-CM

## 2024-08-09 DIAGNOSIS — D649 Anemia, unspecified: Secondary | ICD-10-CM | POA: Insufficient documentation

## 2024-08-09 DIAGNOSIS — E1142 Type 2 diabetes mellitus with diabetic polyneuropathy: Secondary | ICD-10-CM | POA: Diagnosis not present

## 2024-08-09 DIAGNOSIS — Z5111 Encounter for antineoplastic chemotherapy: Secondary | ICD-10-CM

## 2024-08-09 DIAGNOSIS — C9001 Multiple myeloma in remission: Secondary | ICD-10-CM

## 2024-08-09 LAB — CBC WITH DIFFERENTIAL (CANCER CENTER ONLY)
Abs Immature Granulocytes: 0.02 K/uL (ref 0.00–0.07)
Basophils Absolute: 0.1 K/uL (ref 0.0–0.1)
Basophils Relative: 1 %
Eosinophils Absolute: 0.1 K/uL (ref 0.0–0.5)
Eosinophils Relative: 1 %
HCT: 46.9 % (ref 39.0–52.0)
Hemoglobin: 15.9 g/dL (ref 13.0–17.0)
Immature Granulocytes: 0 %
Lymphocytes Relative: 42 %
Lymphs Abs: 3.7 K/uL (ref 0.7–4.0)
MCH: 27 pg (ref 26.0–34.0)
MCHC: 33.9 g/dL (ref 30.0–36.0)
MCV: 79.8 fL — ABNORMAL LOW (ref 80.0–100.0)
Monocytes Absolute: 1 K/uL (ref 0.1–1.0)
Monocytes Relative: 11 %
Neutro Abs: 4 K/uL (ref 1.7–7.7)
Neutrophils Relative %: 45 %
Platelet Count: 182 K/uL (ref 150–400)
RBC: 5.88 MIL/uL — ABNORMAL HIGH (ref 4.22–5.81)
RDW: 15.9 % — ABNORMAL HIGH (ref 11.5–15.5)
WBC Count: 8.9 K/uL (ref 4.0–10.5)
nRBC: 0 % (ref 0.0–0.2)

## 2024-08-09 LAB — COMPREHENSIVE METABOLIC PANEL WITH GFR
ALT: 23 U/L (ref 0–44)
AST: 18 U/L (ref 15–41)
Albumin: 4.3 g/dL (ref 3.5–5.0)
Alkaline Phosphatase: 62 U/L (ref 38–126)
Anion gap: 6 (ref 5–15)
BUN: 12 mg/dL (ref 6–20)
CO2: 27 mmol/L (ref 22–32)
Calcium: 8.8 mg/dL — ABNORMAL LOW (ref 8.9–10.3)
Chloride: 106 mmol/L (ref 98–111)
Creatinine, Ser: 0.69 mg/dL (ref 0.61–1.24)
GFR, Estimated: >60 mL/min (ref >60)
Glucose, Bld: 112 mg/dL — ABNORMAL HIGH (ref 70–99)
Potassium: 3.9 mmol/L (ref 3.5–5.1)
Sodium: 139 mmol/L (ref 135–145)
Total Bilirubin: 1 mg/dL (ref 0.0–1.2)
Total Protein: 6.5 g/dL (ref 6.5–8.1)

## 2024-08-09 MED ORDER — ERGOCALCIFEROL 1.25 MG (50000 UT) PO CAPS
50000.0000 [IU] | ORAL_CAPSULE | ORAL | 3 refills | Status: AC
Start: 2024-08-09 — End: ?

## 2024-08-09 MED ORDER — FAMOTIDINE 20 MG PO TABS
20.0000 mg | ORAL_TABLET | Freq: Once | ORAL | Status: AC
  Administered 2024-08-09 (×2): 20 mg via ORAL
  Filled 2024-08-09: qty 1

## 2024-08-09 MED ORDER — DIPHENHYDRAMINE HCL 25 MG PO CAPS
50.0000 mg | ORAL_CAPSULE | Freq: Once | ORAL | Status: AC
  Administered 2024-08-09 (×2): 50 mg via ORAL
  Filled 2024-08-09: qty 2

## 2024-08-09 MED ORDER — DEXAMETHASONE 4 MG PO TABS
2.0000 mg | ORAL_TABLET | Freq: Once | ORAL | Status: AC
  Administered 2024-08-09 (×2): 2 mg via ORAL
  Filled 2024-08-09: qty 1

## 2024-08-09 MED ORDER — DARATUMUMAB-HYALURONIDASE-FIHJ 1800-30000 MG-UT/15ML ~~LOC~~ SOLN
1800.0000 mg | Freq: Once | SUBCUTANEOUS | Status: AC
  Administered 2024-08-09 (×2): 1800 mg via SUBCUTANEOUS
  Filled 2024-08-09: qty 15

## 2024-08-09 MED ORDER — MONTELUKAST SODIUM 10 MG PO TABS
10.0000 mg | ORAL_TABLET | Freq: Once | ORAL | Status: AC
  Administered 2024-08-09 (×2): 10 mg via ORAL
  Filled 2024-08-09: qty 1

## 2024-08-09 MED ORDER — ZOLEDRONIC ACID 4 MG/100ML IV SOLN
4.0000 mg | Freq: Once | INTRAVENOUS | Status: AC
  Administered 2024-08-09 (×2): 4 mg via INTRAVENOUS
  Filled 2024-08-09: qty 100

## 2024-08-09 MED ORDER — SODIUM CHLORIDE 0.9 % IV SOLN: Freq: Once | INTRAVENOUS | Status: AC

## 2024-08-09 MED ORDER — ACETAMINOPHEN 325 MG PO TABS
650.0000 mg | ORAL_TABLET | Freq: Once | ORAL | Status: AC
  Administered 2024-08-09 (×2): 650 mg via ORAL
  Filled 2024-08-09: qty 2

## 2024-08-09 NOTE — Patient Instructions (Signed)

## 2024-08-09 NOTE — Progress Notes (Signed)
 Pt given Revlimid  on 08/09/24. It is delivered to the [facility]. Pt to start taking this cycle on 08/09/24 - 08/29/24. Pt will take off 08/30/24 - 9/8 /25. New cycle will start on 09/06/24. Pt aware when to start this medication.

## 2024-08-09 NOTE — Progress Notes (Signed)
 PGY2 Oncology Resident Intervention   Intervention: Patient was receiving Zometa  every 4 weeks for the last two years, standard duration for bone protecting therapy with MM. Due to patient's high risk disease status, Dr. Onesimo to keep them on Zometa  but extend the frequency to every 12 weeks from last dose (today). Treatment plan updated.  Updated plan to be reviewed by Leotis Ferries, PharmD  Thank you for allowing pharmacy to participate in this patient's care.  Donald Suarez, PharmD Pharmacy Resident  08/09/2024 2:56 PM

## 2024-08-10 ENCOUNTER — Ambulatory Visit: Admitting: Internal Medicine

## 2024-08-10 ENCOUNTER — Other Ambulatory Visit: Payer: Self-pay

## 2024-08-12 LAB — MULTIPLE MYELOMA PANEL, SERUM
Albumin SerPl Elph-Mcnc: 3.4 g/dL (ref 2.9–4.4)
Albumin/Glob SerPl: 1.3 (ref 0.7–1.7)
Alpha 1: 0.1 g/dL (ref 0.0–0.4)
Alpha2 Glob SerPl Elph-Mcnc: 0.9 g/dL (ref 0.4–1.0)
B-Globulin SerPl Elph-Mcnc: 0.9 g/dL (ref 0.7–1.3)
Gamma Glob SerPl Elph-Mcnc: 0.7 g/dL (ref 0.4–1.8)
Globulin, Total: 2.7 g/dL (ref 2.2–3.9)
IgA: 144 mg/dL (ref 90–386)
IgG (Immunoglobin G), Serum: 738 mg/dL (ref 603–1613)
IgM (Immunoglobulin M), Srm: 34 mg/dL (ref 20–172)
M Protein SerPl Elph-Mcnc: 0.3 g/dL — ABNORMAL HIGH
Total Protein ELP: 6.1 g/dL (ref 6.0–8.5)

## 2024-08-14 ENCOUNTER — Other Ambulatory Visit: Payer: Self-pay

## 2024-08-16 ENCOUNTER — Other Ambulatory Visit

## 2024-08-16 ENCOUNTER — Inpatient Hospital Stay

## 2024-08-16 ENCOUNTER — Ambulatory Visit

## 2024-08-16 NOTE — Progress Notes (Unsigned)
 [name]  HEMATOLOGY/ONCOLOGY CLINIC NOTE  Date of Service: 08/09/2024  Chief complaint - Follow-up for continued evaluation and management of high risk multiple myeloma  Current treatment-daratumumab /Revlimid /dexamethasone   INTERVAL HISTORY:  Donald Suarez is a 58 y.o. male is here with his [name] interpreter for continued evaluation and management of his multiple myeloma.  No acute new symptoms since his last clinic visit.  No focal new bone pains.  No fevers no chills no night sweats. No new toxicities from his current treatment.  No diarrhea no muscle cramps. Discussed all his lab results in details. He is maintaining a very active lifestyle and working full-time as a Engineer, technical sales. He continues to want to hold off on any considerations of transplant.  MEDICAL HISTORY:  Past Medical History:  Diagnosis Date   Abfraction 08/06/2022   Accretions on teeth 08/06/2022   Anemia    Attrition, teeth excessive 08/06/2022   Caries 08/06/2022   Defective dental restoration 08/06/2022   Elevated ferritin 08/07/2022   Hypoalbuminemia 05/13/2022   Lab Results      Component    Value    Date           ALBUMIN     3.7    07/19/2024           ALBUMIN     3.8    07/05/2024           ALBUMIN     3.8    06/10/2024           ALBUMIN     3.7    06/03/2024           ALBUMIN     3.7    05/13/2024           ALBUMIN     4.0    04/29/2024           ALBUMIN     3.9    04/15/2024           ALBUMIN     4.0    03/25/2024           ALBUMIN     4.2    03/11/2024         Hypocalcemia 08/19/2022   In setting of multiple myeloma    Hypokalemia 03/09/2023   Lab Results      Component    Value    Date/Time           K    3.7    07/19/2024 07:46 AM           K    3.4 (L)    07/05/2024 09:33 AM           K    3.1 (L)    06/10/2024 12:04 PM           K    3.3 (L)    06/03/2024 08:41 AM           K    3.2 (L)    05/13/2024 12:13 PM           K    3.0 (L)    04/29/2024 11:07 AM           K    3.3 (L)    04/15/2024 07:42 AM            Hypomagnesemia 12/20/2020   Lab Results      Component    Value    Date/Time           MG  1.8    06/24/2024 08:34 AM           Loose, teeth 08/06/2022   Malocclusion 08/06/2022   Microalbuminuria 08/19/2022   Periodontal disease 08/06/2022   Peripheral neuropathy 09/08/2022   Started 08/2022 A/w diabetes and revlimid  usage Burning on top of left foot   Teeth missing 08/06/2022    SURGICAL HISTORY: Past Surgical History:  Procedure Laterality Date   APPENDECTOMY     [facility] BONE MARROW BIOPSY & ASPIRATION  02/11/2023   [facility] FLUORO GUIDE CV LINE RIGHT  05/15/2022   [facility] PATIENT EVAL TECH 0-60 MINS  05/19/2022   [facility] US  GUIDE VASC ACCESS RIGHT  05/15/2022    SOCIAL HISTORY: Social History   Socioeconomic History   Marital status: Married    Spouse name: Not on file   Number of children: Not on file   Years of education: Not on file   Highest education level: Not on file  Occupational History   Not on file  Tobacco Use   Smoking status: Never   Smokeless tobacco: Never  Vaping Use   Vaping status: Never Used  Substance and Sexual Activity   Alcohol use: Never   Drug use: Never   Sexual activity: Not on file  Other Topics Concern   Not on file  Social History Narrative   Not on file   Social Drivers of Health   Financial Resource Strain: Medium Risk (05/21/2022)   Overall Financial Resource Strain (CARDIA)    Difficulty of Paying Living Expenses: Somewhat hard  Food Insecurity: Food Insecurity Present (05/21/2022)   Hunger Vital Sign    Worried About Running Out of Food in the Last Year: Sometimes true    Ran Out of Food in the Last Year: Sometimes true  Transportation Needs: No Transportation Needs (05/21/2022)   PRAPARE - Administrator, Civil Service (Medical): No    Lack of Transportation (Non-Medical): No  Physical Activity: Not on file  Stress: Not on file  Social Connections: Not on file  Intimate Partner Violence: Not on file    FAMILY  HISTORY: No family history on file.  ALLERGIES:  has no known allergies.  MEDICATIONS:  . Allergies as of 08/09/2024   No Known Allergies      Medication List        Accurate as of August 09, 2024 11:59 PM. If you have any questions, ask your nurse or doctor.          Accu-Chek Guide test strip Generic drug: glucose blood Use to test blood sugars up to 4 times daily as directed.   Accu-Chek Guide w/Device Kit Use to test blood sugars up to 4 times daily as directed.   Accu-Chek Softclix Lancets lancets Use to test blood sugars up to 4 times daily as directed.   acyclovir  400 MG tablet Commonly known as: ZOVIRAX  Take 1 tablet (400 mg total) by mouth 2 (two) times daily.   aspirin  EC 81 MG tablet Take 1 tablet (81 mg total) by mouth daily. Swallow whole.   B-12 1000 MCG Caps Take 1 tablet by mouth daily.   ciclopirox  0.77 % cream Commonly known as: LOPROX  Apply topically 2 (two) times daily.   dapagliflozin  propanediol 10 MG Tabs tablet Commonly known as: Farxiga  Take 1 tablet (10 mg total) by mouth daily before breakfast. sa asar sa hroi   ergocalciferol  1.25 MG (50000 UT) capsule Commonly known as: VITAMIN D2 Take 1 capsule (50,000 Units total) by mouth  once a week. Started by: Emaline Saran   FreeStyle Libre 3 Plus Sensor Misc Change sensor every 15 days.   Kerendia  10 MG Tabs Generic drug: Finerenone  Take 1 tablet (10 mg total) by mouth daily at 12 noon.   lenalidomide  25 MG capsule Commonly known as: REVLIMID  TAKE 1 CAPSULE BY MOUTH 1 TIME A DAY FOR 21 DAYS ON THEN 7 DAYS OFF   losartan  25 MG tablet Commonly known as: COZAAR  Take 1 tablet (25 mg total) by mouth daily. Start taking if blood pressure is over 130/80.  Protects kidneys and lowers blood pressure.   metFORMIN  500 MG tablet Commonly known as: GLUCOPHAGE  Take 2 tablets (1,000 mg total) by mouth 2 (two) times daily with a meal. 2 asar 1 wot, 2 wot mnhum  amang 1 hroi   potassium  chloride SA 20 MEQ tablet Commonly known as: KLOR-CON  M Take 1 tablet (20 mEq total) by mouth daily.   rosuvastatin  10 MG tablet Commonly known as: CRESTOR  Take 1 tablet (10 mg total) by mouth daily.   tirzepatide  10 MG/0.5ML Pen Commonly known as: MOUNJARO  Inject 10 mg into the skin once a week.   Vitamin D3 125 MCG (5000 UT) Caps Take 1 capsule (5,000 Units total) by mouth daily.   Vitamin K2  100 MCG Caps Take 1 capsule by mouth daily at 6 (six) AM.         REVIEW OF SYSTEMS:   10 Point review of Systems was done is negative except as noted above.   PHYSICAL EXAMINATION: VSS GENERAL:alert, in no acute distress and comfortable SKIN: no acute rashes, no significant lesions EYES: conjunctiva are pink and non-injected, sclera anicteric OROPHARYNX: MMM, no exudates, no oropharyngeal erythema or ulceration NECK: supple, no JVD LYMPH:  no palpable lymphadenopathy in the cervical, axillary or inguinal regions LUNGS: clear to auscultation b/l with normal respiratory effort HEART: regular rate & rhythm ABDOMEN:  normoactive bowel sounds , non tender, not distended. Extremity: no pedal edema PSYCH: alert & oriented x 3 with fluent speech NEURO: no focal motor/sensory deficits      LABORATORY DATA:  I have reviewed the data as listed  .    Latest Ref Rng & Units 08/09/2024   11:58 AM 07/19/2024    7:46 AM 07/05/2024    9:33 AM  CBC  WBC 4.0 - 10.5 K/uL 8.9  6.4  7.0   Hemoglobin 13.0 - 17.0 g/dL 84.0  85.4  85.4   Hematocrit 39.0 - 52.0 % 46.9  42.3  42.9   Platelets 150 - 400 K/uL 182  163  164     .    Latest Ref Rng & Units 08/09/2024   11:58 AM 07/19/2024    7:46 AM 07/05/2024    9:33 AM  CMP  Glucose 70 - 99 mg/dL 887  854  860   BUN 6 - 20 mg/dL 12  11  14    Creatinine 0.61 - 1.24 mg/dL 9.30  9.24  9.22   Sodium 135 - 145 mmol/L 139  140  139   Potassium 3.5 - 5.1 mmol/L 3.9  3.7  3.4   Chloride 98 - 111 mmol/L 106  106  105   CO2 22 - 32 mmol/L 27  26  26     Calcium  8.9 - 10.3 mg/dL 8.8  8.6  8.6   Total Protein 6.5 - 8.1 g/dL 6.5  6.1  5.9   Total Bilirubin 0.0 - 1.2 mg/dL 1.0  0.7  0.8  Alkaline Phos 38 - 126 U/L 62  49  57   AST 15 - 41 U/L 18  14  13    ALT 0 - 44 U/L 23  14  18     . Lab Results  Component Value Date   LDH 114 10/10/2022      RADIOGRAPHIC STUDIES: I have personally reviewed the radiological images as listed and agreed with the findings in the report. No results found.  ASSESSMENT & PLAN:   1) R-ISS stage III high risk IgG Lambda Multiple myeloma not on treatment for more than 1 year due to patient's lack of follow-up. -Patient diagnosed December 2021 and received 1 cycle of CyBorD. -Had previously presented with anemia renal insufficiency and hyperviscosity symptoms. -Bone marrow Bx- 90% plasma cells -Cytogenetics showed: Del(1p): Not Detected  Dup(1q): Not Detected  Gains(15): DETECTED  Gains(5 and 9): Not Detected  Del(13q)/-13: DETECTED  Del(17p)(TP53): Not Detected  IGH(Rearrangement): SEE BELOW   IgH complex: t(4;14): DETECTED  t(11;14): Not Detected  t(14;16): Not Detected  t(14;20): Not Detected   Cytogenetics: Normal male karyotype.   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 WBC 7.0, Hgb 14.5, Plt 164, creatinine normal, calcium  8.6.  -Most recent myeloma labs from 06/10/2024 showed M protein measuring 0.2 g/dL (overall stable) -Tolerating Daratumumab /Revlimid /Dex without any prohibitive toxicities.  -Proceed with treatment today without any dose modifications.  -Continue treatment every 2 weeks. Last Zometa  was on 06/03/2024. Next due next treatment due today  Follow-up -RTC in 4 weeks for next toxicity check   All of the patient's questions were answered with apparent satisfaction. The patient  knows to call the clinic with any problems, questions or concerns.  I have spent a total of 30 minutes minutes of face-to-face and non-face-to-face time, preparing to see the patient,  performing a medically appropriate examination, counseling and educating the patient, ordering tests/procedures, documenting clinical information in the electronic health record,and care coordination.   Johnston Police PA-C Dept of Hematology and Oncology [facility] Phone: [phone]

## 2024-08-17 ENCOUNTER — Encounter: Payer: Self-pay | Admitting: Hematology

## 2024-08-22 ENCOUNTER — Ambulatory Visit (INDEPENDENT_AMBULATORY_CARE_PROVIDER_SITE_OTHER): Admitting: Internal Medicine

## 2024-08-22 ENCOUNTER — Encounter: Payer: Self-pay | Admitting: Internal Medicine

## 2024-08-22 VITALS — BP 112/70 | HR 74 | Temp 98.0°F | Ht 66.0 in | Wt 172.2 lb

## 2024-08-22 DIAGNOSIS — Z603 Acculturation difficulty: Secondary | ICD-10-CM | POA: Diagnosis not present

## 2024-08-22 DIAGNOSIS — I10 Essential (primary) hypertension: Secondary | ICD-10-CM

## 2024-08-22 DIAGNOSIS — E1165 Type 2 diabetes mellitus with hyperglycemia: Secondary | ICD-10-CM

## 2024-08-22 DIAGNOSIS — Z758 Other problems related to medical facilities and other health care: Secondary | ICD-10-CM

## 2024-08-22 DIAGNOSIS — Z794 Long term (current) use of insulin: Secondary | ICD-10-CM

## 2024-08-22 MED ORDER — TIRZEPATIDE 12.5 MG/0.5ML ~~LOC~~ SOAJ: 12.5000 mg | SUBCUTANEOUS | 2 refills | Status: DC

## 2024-08-22 NOTE — Assessment & Plan Note (Signed)
 Due to language barrier, an interpreter was present during the history-taking and subsequent discussion (and for part of the physical exam) with this patient. This caused extensive time to be used for education on diabetes mellitus and using continuous glucose monitor today

## 2024-08-22 NOTE — Assessment & Plan Note (Signed)
 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 He uses a continuous glucose monitor (CGM) to understand glucose fluctuations. Currently on Metformin  1000 mg twice daily, Dapagliflozin , and Tirzepatide  10 mg weekly, with no side effects from Tirzepatide . Fasting blood glucose was 124 mg/dL, likely due to a large meal the previous night. The goal is to reduce A1c to below 7% to prevent further kidney damage. Increase Tirzepatide  to 12.5 mg weekly. Educate on dietary changes to reduce high sugar and high carbohydrate foods. Use CGM to monitor glucose fluctuations and adjust diet accordingly. Follow up in 10 days to review CGM data and assess glucose control.  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 He is on Losartan  and Dapagliflozin  for kidney protection, with no serious kidney injury noted. Continue current medications including Losartan  and Dapagliflozin . Educate on the importance of maintaining blood glucose control to prevent further kidney damage.

## 2024-08-22 NOTE — Patient Instructions (Addendum)
 It was a pleasure seeing you today! Your health and satisfaction are our top priorities.  Donald Cone, MD  VISIT SUMMARY: You came in today to set up a new continuous glucose monitoring (CGM) system because your previous sensor failed to sync with your phone. We discussed your current diabetes management and reviewed your medications. Your blood sugar levels have been fluctuating, and you have experienced occasional low blood sugar. We also reviewed your kidney function, which has shown some improvement.  YOUR PLAN: -TYPE 2 DIABETES MELLITUS WITH HYPERGLYCEMIA AND DIABETIC KIDNEY COMPLICATION: Type 2 diabetes is a condition where your body does not use insulin  properly, leading to high blood sugar levels. You are currently taking Metformin , Jardiance, and Mounjaro  to manage your diabetes. Your A1c is 7.1%, and our goal is to reduce it to below 7% to prevent further kidney damage. We will increase your Mounjaro  dose to 12.5 mg weekly and make some dietary changes to reduce high sugar and high carbohydrate foods. Use your CGM to monitor your glucose levels and adjust your diet accordingly. We will follow up in 10 days to review your CGM data and assess your glucose control.  -CHRONIC KIDNEY DISEASE: Chronic kidney disease is a condition where your kidneys are damaged and cannot filter blood as well as they should. Your kidney function has shown recent improvements, likely due to better glucose control. You are taking Losartan  and Jardiance for kidney protection. Continue with your current medications and maintain good blood glucose control to prevent further kidney damage.  INSTRUCTIONS: Please schedule a follow-up appointment in 10 days to review your CGM data and adjust your treatment plan as necessary.  Your Providers PCP: Donald Donald MATSU, MD,  [phone]) Referring Provider: [name], MD,  [phone]) Care Team Provider: [name], MD,  [phone])  NEXT STEPS: [x]   Early Intervention: Schedule sooner appointment, call our on-call services, or go to emergency room if there is any significant Increase in pain or discomfort New or worsening symptoms Sudden or severe changes in your health [x]  Flexible Follow-Up: We recommend a Return in about 10 years (around 08/22/2034). for optimal routine care. This allows for progress monitoring and treatment adjustments. [x]  Preventive Care: Schedule your annual preventive care visit! It's typically covered by insurance and helps identify potential health issues early. [x]  Lab & X-ray Appointments: Incomplete tests scheduled today, or call to schedule. X-rays: [facility] (M-F, 8:30am-noon or 1pm-5pm). [x]  Medical Information Release: Sign a release form at front desk to obtain relevant medical information we don't have.  MAKING THE MOST OF OUR FOCUSED 20 MINUTE APPOINTMENTS: [x]   Clearly state your top concerns at the beginning of the visit to focus our discussion [x]   If you anticipate you will need more time, please inform the front desk during scheduling - we can book multiple appointments in the same week. [x]   If you have transportation problems- use our convenient video appointments or ask about transportation support. [x]   We can get down to business faster if you use MyChart to update information before the visit and submit non-urgent questions before your visit. Thank you for taking the time to provide details through MyChart.  Let our nurse know and she can import this information into your encounter documents.  Arrival and Wait Times: [x]   Arriving on time ensures that everyone receives prompt attention. [x]   Early morning (8a) and afternoon (1p) appointments tend to have shortest wait times. [x]   Unfortunately, we cannot delay appointments for  late arrivals or hold slots during phone calls.  Getting Answers and Following Up [x]   Simple Questions & Concerns: For quick questions or basic  follow-up after your visit, reach us  at [phone] or MyChart messaging. [x]   Complex Concerns: If your concern is more complex, scheduling an appointment might be best. Discuss this with the staff to find the most suitable option. [x]   Lab & Imaging Results: We'll contact you directly if results are abnormal or you don't use MyChart. Most normal results will be on MyChart within 2-3 business days, with a review message from Dr. Jesus. Haven't heard back in 2 weeks? Need results sooner? Contact us  at [phone]. [x]   Referrals: Our referral coordinator will manage specialist referrals. The specialist's office should contact you within 2 weeks to schedule an appointment. Call us  if you haven't heard from them after 2 weeks.  Staying Connected [x]   MyChart: Activate your MyChart for the fastest way to access results and message us . See the last page of this paperwork for instructions on how to activate.  Bring to Your Next Appointment [x]   Medications: Please bring all your medication bottles to your next appointment to ensure we have an accurate record of your prescriptions. [x]   Health Diaries: If you're monitoring any health conditions at home, keeping a diary of your readings can be very helpful for discussions at your next appointment.  Billing [x]   X-ray & Lab Orders: These are billed by separate companies. Contact the invoicing company directly for questions or concerns. [x]   Visit Charges: Discuss any billing inquiries with our administrative services team.  Your Satisfaction Matters [x]   Share Your Experience: We strive for your satisfaction! If you have any complaints, or preferably compliments, please let Dr. Jesus know directly or contact our Practice Administrators, [name] or [employer], by asking at the front desk.   Reviewing Your Records [x]   Review this early draft of your clinical encounter notes below and the final encounter summary tomorrow on MyChart  after its been completed.  All orders placed so far are visible here: Type 2 diabetes mellitus with hyperglycemia, with long-term current use of insulin  (HCC) -     Tirzepatide ; Inject 12.5 mg into the skin once a week.  Dispense: 6 mL; Refill: 2  Hypocalcemia  Language barrier affecting health care  Primary hypertension

## 2024-08-22 NOTE — Progress Notes (Signed)
 ==============================   [facility]: [id number]   -- Medical Office Visit --  Patient: Donald Suarez      Age: 58 y.o.       Sex:  male  Date:   08/22/2024 Today's Healthcare Provider: Bernardino KANDICE Cone, MD  ==============================   Chief Complaint: Diabetes   Discussed the use of AI scribe software for clinical note transcription with the patient, who gave verbal consent to proceed.  History of Present Illness 58 year old male with diabetes who presents for continuous glucose monitoring setup.  He is experiencing issues with his continuous glucose monitoring system, as the previous sensor failed to sync with his phone, resulting in no glucose readings. He is attempting to set up a new sensor today.  He manages his diabetes with 1000 mg of metformin  twice daily, Jardiance, and a weekly injection of Mounjaro , with no reported side effects. He experiences fluctuations in blood sugar levels, with occasional hypoglycemia, sometimes as low as 45-50 mg/dL. He has not eaten breakfast today, and his current blood sugar reading is 124 mg/dL, which he attributes to a large meal the previous night.  He has a history of kidney function issues related to diabetes. He experiences low potassium levels, likely due to sweating from physical activity, and consumes electrolyte drinks like Gatorade Zero to maintain potassium levels without affecting his blood sugar.  No nausea, vomiting, or constipation from his medications. Attempted continuous glucose monitor follow up again; continuing difficulty managing due to language barrier, understanding cellphone use.   Lab Results  Component Value Date   GFR 100.57 12/09/2023   GFR 102.43 09/09/2023   GFR 103.96 06/09/2023   GFR 108.05 08/12/2022   No results found for: EGFR Lab Results  Component Value Date   MICROALBUR <0.7 06/24/2024   MICROALBUR 2.7 (H) 05/24/2024     Lab Results  Component  Value Date   HGBA1C 7.1 (H) 06/24/2024   HGBA1C 7.0 (A) 03/21/2024   HGBA1C 8.4 (H) 12/09/2023     Background Reviewed: Problem List: has Multiple myeloma in remission (HCC); Counseling regarding advance care planning and goals of care; Hyperviscosity; Type 2 diabetes mellitus with hyperglycemia (HCC); Chronic apical periodontitis; Hypocalcemia; Language barrier affecting health care; History of dental problems; Diabetes mellitus with proteinuria (HCC); Hypertension; Chronic kidney disease; Microcytosis; and Hypogammaglobulinemia (HCC) on their problem list. Past Medical History:  has a past medical history of Abfraction (08/06/2022), Accretions on teeth (08/06/2022), Anemia, Attrition, teeth excessive (08/06/2022), Blurry vision (12/20/2020), Caries (08/06/2022), Defective dental restoration (08/06/2022), Elevated ferritin (08/07/2022), Hyperproteinemia (12/20/2020), Hypoalbuminemia (05/13/2022), Hypocalcemia (08/19/2022), Hypokalemia (03/09/2023), Hypomagnesemia (12/20/2020), Loose, teeth (08/06/2022), Malocclusion (08/06/2022), Microalbuminuria (08/19/2022), Periodontal disease (08/06/2022), Peripheral neuropathy (09/08/2022), and Teeth missing (08/06/2022). Past Surgical History:   has a past surgical history that includes Appendectomy; [facility] US  Guide Vasc Access Right (05/15/2022); [facility] Fluoro Guide CV Line Right (05/15/2022); [facility] PATIENT EVAL TECH 0-60 MINS (05/19/2022); and [facility] BONE MARROW BIOPSY & ASPIRATION (02/11/2023). Social History:   reports that he has never smoked. He has never used smokeless tobacco. He reports that he does not drink alcohol and does not use drugs. Family History:  family history is not on file. Allergies:  has no known allergies.   Medication Reconciliation: Current Outpatient Medications on File Prior to Visit  Medication Sig   Accu-Chek Softclix Lancets lancets Use to test blood sugars up to 4 times daily as directed.   acyclovir  (ZOVIRAX ) 400 MG tablet Take 1 tablet (400  mg total)  by mouth 2 (two) times daily.   aspirin  EC 81 MG tablet Take 1 tablet (81 mg total) by mouth daily. Swallow whole.   Blood Glucose Monitoring Suppl (ACCU-CHEK GUIDE) w/Device KIT Use to test blood sugars up to 4 times daily as directed.   Cholecalciferol (VITAMIN D3) 125 MCG (5000 UT) CAPS Take 1 capsule (5,000 Units total) by mouth daily.   ciclopirox  (LOPROX ) 0.77 % cream Apply topically 2 (two) times daily.   Continuous Glucose Sensor (FREESTYLE LIBRE 3 PLUS SENSOR) MISC Change sensor every 15 days.   Cyanocobalamin  (B-12) 1000 MCG CAPS Take 1 tablet by mouth daily.   dapagliflozin  propanediol (FARXIGA ) 10 MG TABS tablet Take 1 tablet (10 mg total) by mouth daily before breakfast. sa asar sa hroi   ergocalciferol  (VITAMIN D2) 1.25 MG (50000 UT) capsule Take 1 capsule (50,000 Units total) by mouth once a week.   Finerenone  (KERENDIA ) 10 MG TABS Take 1 tablet (10 mg total) by mouth daily at 12 noon.   glucose blood (ACCU-CHEK GUIDE) test strip Use to test blood sugars up to 4 times daily as directed.   lenalidomide  (REVLIMID ) 25 MG capsule TAKE 1 CAPSULE BY MOUTH 1 TIME A DAY FOR 21 DAYS ON THEN 7 DAYS OFF   losartan  (COZAAR ) 25 MG tablet Take 1 tablet (25 mg total) by mouth daily. Start taking if blood pressure is over 130/80.  Protects kidneys and lowers blood pressure.   Menaquinone-7 (VITAMIN K2 ) 100 MCG CAPS Take 1 capsule by mouth daily at 6 (six) AM.   metFORMIN  (GLUCOPHAGE ) 500 MG tablet Take 2 tablets (1,000 mg total) by mouth 2 (two) times daily with a meal. 2 asar 1 wot, 2 wot mnhum  amang 1 hroi   potassium chloride  SA (KLOR-CON  M) 20 MEQ tablet Take 1 tablet (20 mEq total) by mouth daily.   rosuvastatin  (CRESTOR ) 10 MG tablet Take 1 tablet (10 mg total) by mouth daily.   Current Facility-Administered Medications on File Prior to Visit  Medication   clotrimazole  (LOTRIMIN ) 1 % cream   Medications Discontinued During This Encounter  Medication Reason   tirzepatide   (MOUNJARO ) 10 MG/0.5ML Pen      Physical Exam:    08/22/2024    7:51 AM 08/09/2024   12:26 PM 07/26/2024    8:13 AM  Vitals with BMI  Height 5' 6 5' 6 5' 6  Weight 172 lbs 3 oz 170 lbs 12 oz 167 lbs 3 oz  BMI 27.81 27.57 27  Systolic 112 143 877  Diastolic 70 87 70  Pulse 74 68 73  Vital signs reviewed.  Nursing notes reviewed. Weight trend reviewed. Physical Activity: Not on file   General Appearance:  No acute distress appreciable.   Well-groomed, healthy-appearing male.  Well proportioned with no abnormal fat distribution.  Good muscle tone. Pulmonary:  Normal work of breathing at rest, no respiratory distress apparent. SpO2: 99 %  Musculoskeletal: All extremities are intact.  Neurological:  Awake, alert, oriented, and engaged.  No obvious focal neurological deficits or cognitive impairments.  Sensorium seems unclouded.   Speech is clear and coherent with logical content. Psychiatric:  Appropriate mood, pleasant and cooperative demeanor, thoughtful and engaged during the exam   Verbalized to patient: Physical Exam    Results:    08/09/2024   12:28 PM 07/19/2024    9:48 AM 06/24/2024    8:04 AM 03/21/2024    8:44 AM  PHQ 2/9 Scores  PHQ - 2 Score 0 0 0 0  Verbalized to patient: Results LABS Hemoglobin A1c: 7.1% Potassium: decreased Protein: decreased  Appointment on 08/09/2024  Component Date Value Ref Range Status   WBC Count 08/09/2024 8.9  4.0 - 10.5 K/uL Final   RBC 08/09/2024 5.88 (H)  4.22 - 5.81 MIL/uL Final   Hemoglobin 08/09/2024 15.9  13.0 - 17.0 g/dL Final   HCT 91/87/7974 46.9  39.0 - 52.0 % Final   MCV 08/09/2024 79.8 (L)  80.0 - 100.0 fL Final   MCH 08/09/2024 27.0  26.0 - 34.0 pg Final   MCHC 08/09/2024 33.9  30.0 - 36.0 g/dL Final   RDW 91/87/7974 15.9 (H)  11.5 - 15.5 % Final   Platelet Count 08/09/2024 182  150 - 400 K/uL Final   nRBC 08/09/2024 0.0  0.0 - 0.2 % Final   Neutrophils Relative % 08/09/2024 45  % Final   Neutro Abs  08/09/2024 4.0  1.7 - 7.7 K/uL Final   Lymphocytes Relative 08/09/2024 42  % Final   Lymphs Abs 08/09/2024 3.7  0.7 - 4.0 K/uL Final   Monocytes Relative 08/09/2024 11  % Final   Monocytes Absolute 08/09/2024 1.0  0.1 - 1.0 K/uL Final   Eosinophils Relative 08/09/2024 1  % Final   Eosinophils Absolute 08/09/2024 0.1  0.0 - 0.5 K/uL Final   Basophils Relative 08/09/2024 1  % Final   Basophils Absolute 08/09/2024 0.1  0.0 - 0.1 K/uL Final   Immature Granulocytes 08/09/2024 0  % Final   Abs Immature Granulocytes 08/09/2024 0.02  0.00 - 0.07 K/uL Final   Sodium 08/09/2024 139  135 - 145 mmol/L Final   Potassium 08/09/2024 3.9  3.5 - 5.1 mmol/L Final   Chloride 08/09/2024 106  98 - 111 mmol/L Final   CO2 08/09/2024 27  22 - 32 mmol/L Final   Glucose, Bld 08/09/2024 112 (H)  70 - 99 mg/dL Final   BUN 91/87/7974 12  6 - 20 mg/dL Final   Creatinine, Ser 08/09/2024 0.69  0.61 - 1.24 mg/dL Final   Calcium  08/09/2024 8.8 (L)  8.9 - 10.3 mg/dL Final   Total Protein 91/87/7974 6.5  6.5 - 8.1 g/dL Final   Albumin  08/09/2024 4.3  3.5 - 5.0 g/dL Final   AST 91/87/7974 18  15 - 41 U/L Final   ALT 08/09/2024 23  0 - 44 U/L Final   Alkaline Phosphatase 08/09/2024 62  38 - 126 U/L Final   Total Bilirubin 08/09/2024 1.0  0.0 - 1.2 mg/dL Final   GFR, Estimated 08/09/2024 >60  >60 mL/min Final   Anion gap 08/09/2024 6  5 - 15 Final   IgG (Immunoglobin G), Serum 08/09/2024 738  603 - 1,613 mg/dL Final   IgA 91/87/7974 144  90 - 386 mg/dL Final   IgM (Immunoglobulin M), Srm 08/09/2024 34  20 - 172 mg/dL Final   Total Protein ELP 08/09/2024 6.1  6.0 - 8.5 g/dL Corrected   Albumin  SerPl Elph-Mcnc 08/09/2024 3.4  2.9 - 4.4 g/dL Corrected   Alpha 1 91/87/7974 0.1  0.0 - 0.4 g/dL Corrected   Alpha2 Glob SerPl Elph-Mcnc 08/09/2024 0.9  0.4 - 1.0 g/dL Corrected   B-Globulin SerPl Elph-Mcnc 08/09/2024 0.9  0.7 - 1.3 g/dL Corrected   Gamma Glob SerPl Elph-Mcnc 08/09/2024 0.7  0.4 - 1.8 g/dL Corrected   M Protein  SerPl Elph-Mcnc 08/09/2024 0.3 (H)  Not Observed g/dL Corrected   Globulin, Total 08/09/2024 2.7  2.2 - 3.9 g/dL Corrected   Albumin /Glob SerPl 08/09/2024 1.3  0.7 - 1.7 Corrected   IFE 1 08/09/2024 Comment (A)   Corrected   Please Note 08/09/2024 Comment   Corrected  Appointment on 07/19/2024  Component Date Value Ref Range Status   WBC Count 07/19/2024 6.4  4.0 - 10.5 K/uL Final   RBC 07/19/2024 5.37  4.22 - 5.81 MIL/uL Final   Hemoglobin 07/19/2024 14.5  13.0 - 17.0 g/dL Final   HCT 92/77/7974 42.3  39.0 - 52.0 % Final   MCV 07/19/2024 78.8 (L)  80.0 - 100.0 fL Final   MCH 07/19/2024 27.0  26.0 - 34.0 pg Final   MCHC 07/19/2024 34.3  30.0 - 36.0 g/dL Final   RDW 92/77/7974 15.9 (H)  11.5 - 15.5 % Final   Platelet Count 07/19/2024 163  150 - 400 K/uL Final   nRBC 07/19/2024 0.0  0.0 - 0.2 % Final   Neutrophils Relative % 07/19/2024 41  % Final   Neutro Abs 07/19/2024 2.6  1.7 - 7.7 K/uL Final   Lymphocytes Relative 07/19/2024 43  % Final   Lymphs Abs 07/19/2024 2.8  0.7 - 4.0 K/uL Final   Monocytes Relative 07/19/2024 10  % Final   Monocytes Absolute 07/19/2024 0.6  0.1 - 1.0 K/uL Final   Eosinophils Relative 07/19/2024 5  % Final   Eosinophils Absolute 07/19/2024 0.3  0.0 - 0.5 K/uL Final   Basophils Relative 07/19/2024 1  % Final   Basophils Absolute 07/19/2024 0.0  0.0 - 0.1 K/uL Final   Immature Granulocytes 07/19/2024 0  % Final   Abs Immature Granulocytes 07/19/2024 0.02  0.00 - 0.07 K/uL Final   Sodium 07/19/2024 140  135 - 145 mmol/L Final   Potassium 07/19/2024 3.7  3.5 - 5.1 mmol/L Final   Chloride 07/19/2024 106  98 - 111 mmol/L Final   CO2 07/19/2024 26  22 - 32 mmol/L Final   Glucose, Bld 07/19/2024 145 (H)  70 - 99 mg/dL Final   BUN 92/77/7974 11  6 - 20 mg/dL Final   Creatinine, Ser 07/19/2024 0.75  0.61 - 1.24 mg/dL Final   Calcium  07/19/2024 8.6 (L)  8.9 - 10.3 mg/dL Final   Total Protein 92/77/7974 6.1 (L)  6.5 - 8.1 g/dL Final   Albumin  07/19/2024 3.7  3.5 -  5.0 g/dL Final   AST 92/77/7974 14 (L)  15 - 41 U/L Final   ALT 07/19/2024 14  0 - 44 U/L Final   Alkaline Phosphatase 07/19/2024 49  38 - 126 U/L Final   Total Bilirubin 07/19/2024 0.7  0.0 - 1.2 mg/dL Final   GFR, Estimated 07/19/2024 >60  >60 mL/min Final   Anion gap 07/19/2024 8  5 - 15 Final   IgG (Immunoglobin G), Serum 07/19/2024 654  603 - 1,613 mg/dL Final   IgA 92/77/7974 133  90 - 386 mg/dL Final   IgM (Immunoglobulin M), Srm 07/19/2024 21  20 - 172 mg/dL Final   Total Protein ELP 07/19/2024 5.7 (L)  6.0 - 8.5 g/dL Corrected   Albumin  SerPl Elph-Mcnc 07/19/2024 3.3  2.9 - 4.4 g/dL Corrected   Alpha 1 92/77/7974 0.1  0.0 - 0.4 g/dL Corrected   Alpha2 Glob SerPl Elph-Mcnc 07/19/2024 0.9  0.4 - 1.0 g/dL Corrected   B-Globulin SerPl Elph-Mcnc 07/19/2024 0.7  0.7 - 1.3 g/dL Corrected   Gamma Glob SerPl Elph-Mcnc 07/19/2024 0.7  0.4 - 1.8 g/dL Corrected   M Protein SerPl Elph-Mcnc 07/19/2024 0.3 (H)  Not Observed g/dL Corrected   Globulin, Total 07/19/2024 2.4  2.2 - 3.9 g/dL Corrected   Albumin /Glob SerPl 07/19/2024 1.4  0.7 - 1.7 Corrected   IFE 1 07/19/2024 Comment (A)   Corrected   Please Note 07/19/2024 Comment   Corrected  Appointment on 07/05/2024  Component Date Value Ref Range Status   WBC Count 07/05/2024 7.0  4.0 - 10.5 K/uL Final   RBC 07/05/2024 5.42  4.22 - 5.81 MIL/uL Final   Hemoglobin 07/05/2024 14.5  13.0 - 17.0 g/dL Final   HCT 92/91/7974 42.9  39.0 - 52.0 % Final   MCV 07/05/2024 79.2 (L)  80.0 - 100.0 fL Final   MCH 07/05/2024 26.8  26.0 - 34.0 pg Final   MCHC 07/05/2024 33.8  30.0 - 36.0 g/dL Final   RDW 92/91/7974 15.9 (H)  11.5 - 15.5 % Final   Platelet Count 07/05/2024 164  150 - 400 K/uL Final   nRBC 07/05/2024 0.0  0.0 - 0.2 % Final   Neutrophils Relative % 07/05/2024 45  % Final   Neutro Abs 07/05/2024 3.2  1.7 - 7.7 K/uL Final   Lymphocytes Relative 07/05/2024 43  % Final   Lymphs Abs 07/05/2024 3.0  0.7 - 4.0 K/uL Final   Monocytes Relative  07/05/2024 7  % Final   Monocytes Absolute 07/05/2024 0.5  0.1 - 1.0 K/uL Final   Eosinophils Relative 07/05/2024 4  % Final   Eosinophils Absolute 07/05/2024 0.3  0.0 - 0.5 K/uL Final   Basophils Relative 07/05/2024 1  % Final   Basophils Absolute 07/05/2024 0.0  0.0 - 0.1 K/uL Final   Immature Granulocytes 07/05/2024 0  % Final   Abs Immature Granulocytes 07/05/2024 0.02  0.00 - 0.07 K/uL Final   Sodium 07/05/2024 139  135 - 145 mmol/L Final   Potassium 07/05/2024 3.4 (L)  3.5 - 5.1 mmol/L Final   Chloride 07/05/2024 105  98 - 111 mmol/L Final   CO2 07/05/2024 26  22 - 32 mmol/L Final   Glucose, Bld 07/05/2024 139 (H)  70 - 99 mg/dL Final   BUN 92/91/7974 14  6 - 20 mg/dL Final   Creatinine, Ser 07/05/2024 0.77  0.61 - 1.24 mg/dL Final   Calcium  07/05/2024 8.6 (L)  8.9 - 10.3 mg/dL Final   Total Protein 92/91/7974 5.9 (L)  6.5 - 8.1 g/dL Final   Albumin  07/05/2024 3.8  3.5 - 5.0 g/dL Final   AST 92/91/7974 13 (L)  15 - 41 U/L Final   ALT 07/05/2024 18  0 - 44 U/L Final   Alkaline Phosphatase 07/05/2024 57  38 - 126 U/L Final   Total Bilirubin 07/05/2024 0.8  0.0 - 1.2 mg/dL Final   GFR, Estimated 07/05/2024 >60  >60 mL/min Final   Anion gap 07/05/2024 8  5 - 15 Final   IgG (Immunoglobin G), Serum 07/05/2024 664  603 - 1,613 mg/dL Final   IgA 92/91/7974 139  90 - 386 mg/dL Final   IgM (Immunoglobulin M), Srm 07/05/2024 21  20 - 172 mg/dL Final   Total Protein ELP 07/05/2024 5.8 (L)  6.0 - 8.5 g/dL Corrected   Albumin  SerPl Elph-Mcnc 07/05/2024 3.6  2.9 - 4.4 g/dL Corrected   Alpha 1 92/91/7974 0.1  0.0 - 0.4 g/dL Corrected   Alpha2 Glob SerPl Elph-Mcnc 07/05/2024 0.8  0.4 - 1.0 g/dL Corrected   B-Globulin SerPl Elph-Mcnc 07/05/2024 0.8  0.7 - 1.3 g/dL Corrected   Gamma Glob SerPl Elph-Mcnc 07/05/2024 0.6  0.4 - 1.8 g/dL Corrected   M Protein SerPl Elph-Mcnc 07/05/2024  0.3 (H)  Not Observed g/dL Corrected   Globulin, Total 07/05/2024 2.2  2.2 - 3.9 g/dL Corrected   Albumin /Glob  SerPl 07/05/2024 1.7  0.7 - 1.7 Corrected   IFE 1 07/05/2024 Comment (A)   Corrected   Please Note 07/05/2024 Comment   Corrected  Office Visit on 06/24/2024  Component Date Value Ref Range Status   Magnesium 06/24/2024 1.8  1.5 - 2.5 mg/dL Final   VITD 93/72/7974 16.19 (L)  30.00 - 100.00 ng/mL Final   Microalb, Ur 06/24/2024 <0.7  mg/dL Final   Creatinine,U 93/72/7974 79.0  mg/dL Final   Microalb Creat Ratio 06/24/2024 Unable to calculate  0.0 - 30.0 mg/g Final   Hgb A1c MFr Bld 06/24/2024 7.1 (H)  4.6 - 6.5 % Final  Appointment on 06/10/2024  Component Date Value Ref Range Status   WBC Count 06/10/2024 6.5  4.0 - 10.5 K/uL Final   RBC 06/10/2024 5.12  4.22 - 5.81 MIL/uL Final   Hemoglobin 06/10/2024 13.5  13.0 - 17.0 g/dL Final   HCT 93/86/7974 39.9  39.0 - 52.0 % Final   MCV 06/10/2024 77.9 (L)  80.0 - 100.0 fL Final   MCH 06/10/2024 26.4  26.0 - 34.0 pg Final   MCHC 06/10/2024 33.8  30.0 - 36.0 g/dL Final   RDW 93/86/7974 15.7 (H)  11.5 - 15.5 % Final   Platelet Count 06/10/2024 154  150 - 400 K/uL Final   nRBC 06/10/2024 0.0  0.0 - 0.2 % Final   Neutrophils Relative % 06/10/2024 43  % Final   Neutro Abs 06/10/2024 2.9  1.7 - 7.7 K/uL Final   Lymphocytes Relative 06/10/2024 35  % Final   Lymphs Abs 06/10/2024 2.2  0.7 - 4.0 K/uL Final   Monocytes Relative 06/10/2024 10  % Final   Monocytes Absolute 06/10/2024 0.7  0.1 - 1.0 K/uL Final   Eosinophils Relative 06/10/2024 10  % Final   Eosinophils Absolute 06/10/2024 0.6 (H)  0.0 - 0.5 K/uL Final   Basophils Relative 06/10/2024 2  % Final   Basophils Absolute 06/10/2024 0.1  0.0 - 0.1 K/uL Final   Immature Granulocytes 06/10/2024 0  % Final   Abs Immature Granulocytes 06/10/2024 0.01  0.00 - 0.07 K/uL Final   Sodium 06/10/2024 141  135 - 145 mmol/L Final   Potassium 06/10/2024 3.1 (L)  3.5 - 5.1 mmol/L Final   Chloride 06/10/2024 111  98 - 111 mmol/L Final   CO2 06/10/2024 25  22 - 32 mmol/L Final   Glucose, Bld 06/10/2024 153  (H)  70 - 99 mg/dL Final   BUN 93/86/7974 14  6 - 20 mg/dL Final   Creatinine, Ser 06/10/2024 0.68  0.61 - 1.24 mg/dL Final   Calcium  06/10/2024 8.2 (L)  8.9 - 10.3 mg/dL Final   Total Protein 93/86/7974 5.9 (L)  6.5 - 8.1 g/dL Final   Albumin  06/10/2024 3.8  3.5 - 5.0 g/dL Final   AST 93/86/7974 17  15 - 41 U/L Final   ALT 06/10/2024 27  0 - 44 U/L Final   Alkaline Phosphatase 06/10/2024 56  38 - 126 U/L Final   Total Bilirubin 06/10/2024 0.7  0.0 - 1.2 mg/dL Final   GFR, Estimated 06/10/2024 >60  >60 mL/min Final   Anion gap 06/10/2024 5  5 - 15 Final   IgG (Immunoglobin G), Serum 06/10/2024 570 (L)  603 - 1,613 mg/dL Final   IgA 93/86/7974 133  90 - 386 mg/dL Final   IgM (  Immunoglobulin M), Srm 06/10/2024 15 (L)  20 - 172 mg/dL Final   Total Protein ELP 06/10/2024 5.6 (L)  6.0 - 8.5 g/dL Corrected   Albumin  SerPl Elph-Mcnc 06/10/2024 3.2  2.9 - 4.4 g/dL Corrected   Alpha 1 93/86/7974 0.1  0.0 - 0.4 g/dL Corrected   Alpha2 Glob SerPl Elph-Mcnc 06/10/2024 0.9  0.4 - 1.0 g/dL Corrected   B-Globulin SerPl Elph-Mcnc 06/10/2024 0.9  0.7 - 1.3 g/dL Corrected   Gamma Glob SerPl Elph-Mcnc 06/10/2024 0.6  0.4 - 1.8 g/dL Corrected   M Protein SerPl Elph-Mcnc 06/10/2024 0.2 (H)  Not Observed g/dL Corrected   Globulin, Total 06/10/2024 2.4  2.2 - 3.9 g/dL Corrected   Albumin /Glob SerPl 06/10/2024 1.4  0.7 - 1.7 Corrected   IFE 1 06/10/2024 Comment (A)   Corrected   Please Note 06/10/2024 Comment   Corrected  Appointment on 06/03/2024  Component Date Value Ref Range Status   WBC Count 06/03/2024 5.6  4.0 - 10.5 K/uL Final   RBC 06/03/2024 5.15  4.22 - 5.81 MIL/uL Final   Hemoglobin 06/03/2024 13.7  13.0 - 17.0 g/dL Final   HCT 93/93/7974 40.2  39.0 - 52.0 % Final   MCV 06/03/2024 78.1 (L)  80.0 - 100.0 fL Final   MCH 06/03/2024 26.6  26.0 - 34.0 pg Final   MCHC 06/03/2024 34.1  30.0 - 36.0 g/dL Final   RDW 93/93/7974 15.8 (H)  11.5 - 15.5 % Final   Platelet Count 06/03/2024 148 (L)  150 -  400 K/uL Final   nRBC 06/03/2024 0.0  0.0 - 0.2 % Final   Neutrophils Relative % 06/03/2024 34  % Final   Neutro Abs 06/03/2024 1.9  1.7 - 7.7 K/uL Final   Lymphocytes Relative 06/03/2024 43  % Final   Lymphs Abs 06/03/2024 2.4  0.7 - 4.0 K/uL Final   Monocytes Relative 06/03/2024 12  % Final   Monocytes Absolute 06/03/2024 0.7  0.1 - 1.0 K/uL Final   Eosinophils Relative 06/03/2024 10  % Final   Eosinophils Absolute 06/03/2024 0.5  0.0 - 0.5 K/uL Final   Basophils Relative 06/03/2024 1  % Final   Basophils Absolute 06/03/2024 0.1  0.0 - 0.1 K/uL Final   Immature Granulocytes 06/03/2024 0  % Final   Abs Immature Granulocytes 06/03/2024 0.01  0.00 - 0.07 K/uL Final   Sodium 06/03/2024 142  135 - 145 mmol/L Final   Potassium 06/03/2024 3.3 (L)  3.5 - 5.1 mmol/L Final   Chloride 06/03/2024 110  98 - 111 mmol/L Final   CO2 06/03/2024 25  22 - 32 mmol/L Final   Glucose, Bld 06/03/2024 147 (H)  70 - 99 mg/dL Final   BUN 93/93/7974 14  6 - 20 mg/dL Final   Creatinine, Ser 06/03/2024 0.80  0.61 - 1.24 mg/dL Final   Calcium  06/03/2024 8.5 (L)  8.9 - 10.3 mg/dL Final   Total Protein 93/93/7974 5.8 (L)  6.5 - 8.1 g/dL Final   Albumin  06/03/2024 3.7  3.5 - 5.0 g/dL Final   AST 93/93/7974 16  15 - 41 U/L Final   ALT 06/03/2024 24  0 - 44 U/L Final   Alkaline Phosphatase 06/03/2024 44  38 - 126 U/L Final   Total Bilirubin 06/03/2024 0.7  0.0 - 1.2 mg/dL Final   GFR, Estimated 06/03/2024 >60  >60 mL/min Final   Anion gap 06/03/2024 7  5 - 15 Final   IgG (Immunoglobin G), Serum 06/03/2024 579 (L)  603 - 1,613  mg/dL Final   IgA 93/93/7974 127  90 - 386 mg/dL Final   IgM (Immunoglobulin M), Srm 06/03/2024 15 (L)  20 - 172 mg/dL Final   Total Protein ELP 06/03/2024 5.6 (L)  6.0 - 8.5 g/dL Corrected   Albumin  SerPl Elph-Mcnc 06/03/2024 3.4  2.9 - 4.4 g/dL Corrected   Alpha 1 93/93/7974 0.1  0.0 - 0.4 g/dL Corrected   Alpha2 Glob SerPl Elph-Mcnc 06/03/2024 0.7  0.4 - 1.0 g/dL Corrected   B-Globulin  SerPl Elph-Mcnc 06/03/2024 0.8  0.7 - 1.3 g/dL Corrected   Gamma Glob SerPl Elph-Mcnc 06/03/2024 0.6  0.4 - 1.8 g/dL Corrected   M Protein SerPl Elph-Mcnc 06/03/2024 0.3 (H)  Not Observed g/dL Corrected   Globulin, Total 06/03/2024 2.2  2.2 - 3.9 g/dL Corrected   Albumin /Glob SerPl 06/03/2024 1.6  0.7 - 1.7 Corrected   IFE 1 06/03/2024 Comment (A)   Corrected   Please Note 06/03/2024 Comment   Corrected  Appointment on 05/24/2024  Component Date Value Ref Range Status   Microalb, Ur 05/24/2024 2.7 (H)  0.0 - 1.9 mg/dL Final   Creatinine,U 94/72/7974 98.6  mg/dL Final   Microalb Creat Ratio 05/24/2024 27.4  0.0 - 30.0 mg/g Final  Appointment on 05/13/2024  Component Date Value Ref Range Status   IgG (Immunoglobin G), Serum 05/13/2024 600 (L)  603 - 1,613 mg/dL Final   IgA 94/83/7974 148  90 - 386 mg/dL Final   IgM (Immunoglobulin M), Srm 05/13/2024 22  20 - 172 mg/dL Final   Total Protein ELP 05/13/2024 5.5 (L)  6.0 - 8.5 g/dL Corrected   Albumin  SerPl Elph-Mcnc 05/13/2024 3.2  2.9 - 4.4 g/dL Corrected   Alpha 1 94/83/7974 0.2  0.0 - 0.4 g/dL Corrected   Alpha2 Glob SerPl Elph-Mcnc 05/13/2024 0.7  0.4 - 1.0 g/dL Corrected   B-Globulin SerPl Elph-Mcnc 05/13/2024 0.8  0.7 - 1.3 g/dL Corrected   Gamma Glob SerPl Elph-Mcnc 05/13/2024 0.6  0.4 - 1.8 g/dL Corrected   M Protein SerPl Elph-Mcnc 05/13/2024 0.2 (H)  Not Observed g/dL Corrected   Globulin, Total 05/13/2024 2.3  2.2 - 3.9 g/dL Corrected   Albumin /Glob SerPl 05/13/2024 1.4  0.7 - 1.7 Corrected   IFE 1 05/13/2024 Comment (A)   Corrected   Please Note 05/13/2024 Comment   Corrected   Sodium 05/13/2024 141  135 - 145 mmol/L Final   Potassium 05/13/2024 3.2 (L)  3.5 - 5.1 mmol/L Final   Chloride 05/13/2024 110  98 - 111 mmol/L Final   CO2 05/13/2024 27  22 - 32 mmol/L Final   Glucose, Bld 05/13/2024 147 (H)  70 - 99 mg/dL Final   BUN 94/83/7974 14  6 - 20 mg/dL Final   Creatinine, Ser 05/13/2024 0.71  0.61 - 1.24 mg/dL Final    Calcium  05/13/2024 8.8 (L)  8.9 - 10.3 mg/dL Final   Total Protein 94/83/7974 5.9 (L)  6.5 - 8.1 g/dL Final   Albumin  05/13/2024 3.7  3.5 - 5.0 g/dL Final   AST 94/83/7974 14 (L)  15 - 41 U/L Final   ALT 05/13/2024 14  0 - 44 U/L Final   Alkaline Phosphatase 05/13/2024 46  38 - 126 U/L Final   Total Bilirubin 05/13/2024 0.6  0.0 - 1.2 mg/dL Final   GFR, Estimated 05/13/2024 >60  >60 mL/min Final   Anion gap 05/13/2024 4 (L)  5 - 15 Final   WBC Count 05/13/2024 6.2  4.0 - 10.5 K/uL Final   RBC 05/13/2024  5.27  4.22 - 5.81 MIL/uL Final   Hemoglobin 05/13/2024 13.9  13.0 - 17.0 g/dL Final   HCT 94/83/7974 41.1  39.0 - 52.0 % Final   MCV 05/13/2024 78.0 (L)  80.0 - 100.0 fL Final   MCH 05/13/2024 26.4  26.0 - 34.0 pg Final   MCHC 05/13/2024 33.8  30.0 - 36.0 g/dL Final   RDW 94/83/7974 15.1  11.5 - 15.5 % Final   Platelet Count 05/13/2024 208  150 - 400 K/uL Final   nRBC 05/13/2024 0.0  0.0 - 0.2 % Final   Neutrophils Relative % 05/13/2024 46  % Final   Neutro Abs 05/13/2024 2.9  1.7 - 7.7 K/uL Final   Lymphocytes Relative 05/13/2024 37  % Final   Lymphs Abs 05/13/2024 2.3  0.7 - 4.0 K/uL Final   Monocytes Relative 05/13/2024 9  % Final   Monocytes Absolute 05/13/2024 0.6  0.1 - 1.0 K/uL Final   Eosinophils Relative 05/13/2024 5  % Final   Eosinophils Absolute 05/13/2024 0.3  0.0 - 0.5 K/uL Final   Basophils Relative 05/13/2024 3  % Final   Basophils Absolute 05/13/2024 0.2 (H)  0.0 - 0.1 K/uL Final   Immature Granulocytes 05/13/2024 0  % Final   Abs Immature Granulocytes 05/13/2024 0.02  0.00 - 0.07 K/uL Final  Appointment on 04/29/2024  Component Date Value Ref Range Status   WBC Count 04/29/2024 5.0  4.0 - 10.5 K/uL Final   RBC 04/29/2024 5.36  4.22 - 5.81 MIL/uL Final   Hemoglobin 04/29/2024 14.5  13.0 - 17.0 g/dL Final   HCT 94/97/7974 42.2  39.0 - 52.0 % Final   MCV 04/29/2024 78.7 (L)  80.0 - 100.0 fL Final   MCH 04/29/2024 27.1  26.0 - 34.0 pg Final   MCHC 04/29/2024 34.4   30.0 - 36.0 g/dL Final   RDW 94/97/7974 15.1  11.5 - 15.5 % Final   Platelet Count 04/29/2024 143 (L)  150 - 400 K/uL Final   nRBC 04/29/2024 0.0  0.0 - 0.2 % Final   Neutrophils Relative % 04/29/2024 40  % Final   Neutro Abs 04/29/2024 2.0  1.7 - 7.7 K/uL Final   Lymphocytes Relative 04/29/2024 37  % Final   Lymphs Abs 04/29/2024 1.9  0.7 - 4.0 K/uL Final   Monocytes Relative 04/29/2024 14  % Final   Monocytes Absolute 04/29/2024 0.7  0.1 - 1.0 K/uL Final   Eosinophils Relative 04/29/2024 7  % Final   Eosinophils Absolute 04/29/2024 0.3  0.0 - 0.5 K/uL Final   Basophils Relative 04/29/2024 2  % Final   Basophils Absolute 04/29/2024 0.1  0.0 - 0.1 K/uL Final   Immature Granulocytes 04/29/2024 0  % Final   Abs Immature Granulocytes 04/29/2024 0.01  0.00 - 0.07 K/uL Final   Sodium 04/29/2024 141  135 - 145 mmol/L Final   Potassium 04/29/2024 3.0 (L)  3.5 - 5.1 mmol/L Final   Chloride 04/29/2024 109  98 - 111 mmol/L Final   CO2 04/29/2024 27  22 - 32 mmol/L Final   Glucose, Bld 04/29/2024 139 (H)  70 - 99 mg/dL Final   BUN 94/97/7974 13  6 - 20 mg/dL Final   Creatinine, Ser 04/29/2024 0.72  0.61 - 1.24 mg/dL Final   Calcium  04/29/2024 8.4 (L)  8.9 - 10.3 mg/dL Final   Total Protein 94/97/7974 6.0 (L)  6.5 - 8.1 g/dL Final   Albumin  04/29/2024 4.0  3.5 - 5.0 g/dL Final   AST  04/29/2024 18  15 - 41 U/L Final   ALT 04/29/2024 27  0 - 44 U/L Final   Alkaline Phosphatase 04/29/2024 51  38 - 126 U/L Final   Total Bilirubin 04/29/2024 0.6  0.0 - 1.2 mg/dL Final   GFR, Estimated 04/29/2024 >60  >60 mL/min Final   Anion gap 04/29/2024 5  5 - 15 Final   IgG (Immunoglobin G), Serum 04/29/2024 659  603 - 1,613 mg/dL Final   IgA 94/97/7974 142  90 - 386 mg/dL Final   IgM (Immunoglobulin M), Srm 04/29/2024 19 (L)  20 - 172 mg/dL Final   Total Protein ELP 04/29/2024 5.6 (L)  6.0 - 8.5 g/dL Corrected   Albumin  SerPl Elph-Mcnc 04/29/2024 3.4  2.9 - 4.4 g/dL Corrected   Alpha 1 94/97/7974 0.1  0.0 -  0.4 g/dL Corrected   Alpha2 Glob SerPl Elph-Mcnc 04/29/2024 0.7  0.4 - 1.0 g/dL Corrected   B-Globulin SerPl Elph-Mcnc 04/29/2024 0.8  0.7 - 1.3 g/dL Corrected   Gamma Glob SerPl Elph-Mcnc 04/29/2024 0.6  0.4 - 1.8 g/dL Corrected   M Protein SerPl Elph-Mcnc 04/29/2024 0.3 (H)  Not Observed g/dL Corrected   Globulin, Total 04/29/2024 2.2  2.2 - 3.9 g/dL Corrected   Albumin /Glob SerPl 04/29/2024 1.6  0.7 - 1.7 Corrected   IFE 1 04/29/2024 Comment (A)   Corrected   Please Note 04/29/2024 Comment   Corrected  Appointment on 04/15/2024  Component Date Value Ref Range Status   IgG (Immunoglobin G), Serum 04/15/2024 647  603 - 1,613 mg/dL Final   IgA 95/81/7974 137  90 - 386 mg/dL Final   IgM (Immunoglobulin M), Srm 04/15/2024 20  20 - 172 mg/dL Final   Total Protein ELP 04/15/2024 5.6 (L)  6.0 - 8.5 g/dL Corrected   Albumin  SerPl Elph-Mcnc 04/15/2024 3.4  2.9 - 4.4 g/dL Corrected   Alpha 1 95/81/7974 0.1  0.0 - 0.4 g/dL Corrected   Alpha2 Glob SerPl Elph-Mcnc 04/15/2024 0.7  0.4 - 1.0 g/dL Corrected   B-Globulin SerPl Elph-Mcnc 04/15/2024 0.7  0.7 - 1.3 g/dL Corrected   Gamma Glob SerPl Elph-Mcnc 04/15/2024 0.6  0.4 - 1.8 g/dL Corrected   M Protein SerPl Elph-Mcnc 04/15/2024 0.3 (H)  Not Observed g/dL Corrected   Globulin, Total 04/15/2024 2.2  2.2 - 3.9 g/dL Corrected   Albumin /Glob SerPl 04/15/2024 1.6  0.7 - 1.7 Corrected   IFE 1 04/15/2024 Comment (A)   Corrected   Please Note 04/15/2024 Comment   Corrected   Sodium 04/15/2024 140  135 - 145 mmol/L Final   Potassium 04/15/2024 3.3 (L)  3.5 - 5.1 mmol/L Final   Chloride 04/15/2024 109  98 - 111 mmol/L Final   CO2 04/15/2024 24  22 - 32 mmol/L Final   Glucose, Bld 04/15/2024 169 (H)  70 - 99 mg/dL Final   BUN 95/81/7974 12  6 - 20 mg/dL Final   Creatinine, Ser 04/15/2024 0.69  0.61 - 1.24 mg/dL Final   Calcium  04/15/2024 8.3 (L)  8.9 - 10.3 mg/dL Final   Total Protein 95/81/7974 5.9 (L)  6.5 - 8.1 g/dL Final   Albumin  04/15/2024 3.9   3.5 - 5.0 g/dL Final   AST 95/81/7974 16  15 - 41 U/L Final   ALT 04/15/2024 15  0 - 44 U/L Final   Alkaline Phosphatase 04/15/2024 44  38 - 126 U/L Final   Total Bilirubin 04/15/2024 0.5  0.0 - 1.2 mg/dL Final   GFR, Estimated 04/15/2024 >60  >60 mL/min  Final   Anion gap 04/15/2024 7  5 - 15 Final   WBC Count 04/15/2024 5.0  4.0 - 10.5 K/uL Final   RBC 04/15/2024 5.25  4.22 - 5.81 MIL/uL Final   Hemoglobin 04/15/2024 14.1  13.0 - 17.0 g/dL Final   HCT 95/81/7974 41.4  39.0 - 52.0 % Final   MCV 04/15/2024 78.9 (L)  80.0 - 100.0 fL Final   MCH 04/15/2024 26.9  26.0 - 34.0 pg Final   MCHC 04/15/2024 34.1  30.0 - 36.0 g/dL Final   RDW 95/81/7974 14.9  11.5 - 15.5 % Final   Platelet Count 04/15/2024 201  150 - 400 K/uL Final   nRBC 04/15/2024 0.0  0.0 - 0.2 % Final   Neutrophils Relative % 04/15/2024 35  % Final   Neutro Abs 04/15/2024 1.8  1.7 - 7.7 K/uL Final   Lymphocytes Relative 04/15/2024 47  % Final   Lymphs Abs 04/15/2024 2.3  0.7 - 4.0 K/uL Final   Monocytes Relative 04/15/2024 11  % Final   Monocytes Absolute 04/15/2024 0.6  0.1 - 1.0 K/uL Final   Eosinophils Relative 04/15/2024 6  % Final   Eosinophils Absolute 04/15/2024 0.3  0.0 - 0.5 K/uL Final   Basophils Relative 04/15/2024 1  % Final   Basophils Absolute 04/15/2024 0.1  0.0 - 0.1 K/uL Final   Immature Granulocytes 04/15/2024 0  % Final   Abs Immature Granulocytes 04/15/2024 0.01  0.00 - 0.07 K/uL Final  There may be more visits with results that are not included.  No image results found. No results found.       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 He uses a continuous glucose monitor (CGM) to understand glucose fluctuations. Currently on Metformin  1000 mg twice daily, Dapagliflozin , and  Tirzepatide  10 mg weekly, with no side effects from Tirzepatide . Fasting blood glucose was 124 mg/dL, likely due to a large meal the previous night. The goal is to reduce A1c to below 7% to prevent further kidney damage. Increase Tirzepatide  to 12.5 mg weekly. Educate on dietary changes to reduce high sugar and high carbohydrate foods. Use CGM to monitor glucose fluctuations and adjust diet accordingly. Follow up in 10 days to review CGM data and assess glucose control.  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 He is on Losartan  and Dapagliflozin  for kidney protection, with no serious kidney injury noted. Continue current medications including Losartan  and Dapagliflozin . Educate on the importance of maintaining blood glucose control to prevent further kidney damage. Hypocalcemia Encouraged patient to address with Gatorade zero and other low sugar electrolyte drinks Language barrier affecting health care Due to language barrier, an interpreter was present during the history-taking and subsequent discussion (and for part of the physical exam) with this patient. This caused extensive time to be used for education on diabetes mellitus and using continuous glucose monitor today Primary hypertension Well-controlled  Continue(s) with 08/22/2024 Current hypertension medications:       Sig   losartan  (COZAAR ) 25 MG tablet Take 1 tablet (25 mg total) by mouth daily. Start taking if blood pressure is over 130/80.  Protects kidneys and lowers blood pressure.  ORDER ASSOCIATIONS  #   DIAGNOSIS / CONDITION ICD-10 ENCOUNTER ORDER     ICD-10-CM   1. Type 2 diabetes mellitus with hyperglycemia, with long-term current use of insulin  (HCC)  E11.65 tirzepatide  (MOUNJARO ) 12.5 MG/0.5ML Pen   Z79.4     2. Hypocalcemia  E83.51     3. Language barrier affecting health care  Z60.3     Z75.8     4. Primary hypertension  I10            Orders Placed in Encounter:    Meds ordered this encounter  Medications   tirzepatide  (MOUNJARO ) 12.5 MG/0.5ML Pen    Sig: Inject 12.5 mg into the skin once a week.    Dispense:  6 mL    Refill:  2   I personally spent a total of 33 minutes in the care of the patient today including performing a medically appropriate exam/evaluation, counseling and educating, documenting clinical information in the EHR, and communicating results.      This document was synthesized by artificial intelligence ([employer]bridge) using HIPAA-compliant recording of the clinical interaction;   We discussed the use of AI scribe software for clinical note transcription with the patient, who gave verbal consent to proceed. additional Info: This encounter employed state-of-the-art, real-time, collaborative documentation. The patient actively reviewed and assisted in updating their electronic medical record on a shared screen, ensuring transparency and facilitating joint problem-solving for the problem list, overview, and plan. This approach promotes accurate, informed care. The treatment plan was discussed and reviewed in detail, including medication safety, potential side effects, and all patient questions. We confirmed understanding and comfort with the plan. Follow-up instructions were established, including contacting the office for any concerns, returning if symptoms worsen, persist, or new symptoms develop, and precautions for potential emergency department visits.

## 2024-08-22 NOTE — Assessment & Plan Note (Signed)
 Encouraged patient to address with Gatorade zero and other low sugar electrolyte drinks

## 2024-08-22 NOTE — Assessment & Plan Note (Signed)
 Well-controlled  Continue(s) with 08/22/2024 Current hypertension medications:       Sig   losartan  (COZAAR ) 25 MG tablet Take 1 tablet (25 mg total) by mouth daily. Start taking if blood pressure is over 130/80.  Protects kidneys and lowers blood pressure.

## 2024-08-23 ENCOUNTER — Inpatient Hospital Stay

## 2024-08-23 VITALS — BP 140/79 | HR 69 | Temp 97.8°F | Resp 16

## 2024-08-23 DIAGNOSIS — Z5112 Encounter for antineoplastic immunotherapy: Secondary | ICD-10-CM | POA: Diagnosis not present

## 2024-08-23 DIAGNOSIS — C9001 Multiple myeloma in remission: Secondary | ICD-10-CM

## 2024-08-23 DIAGNOSIS — Z7189 Other specified counseling: Secondary | ICD-10-CM

## 2024-08-23 LAB — CBC WITH DIFFERENTIAL (CANCER CENTER ONLY)
Abs Immature Granulocytes: 0.02 K/uL (ref 0.00–0.07)
Basophils Absolute: 0.1 K/uL (ref 0.0–0.1)
Basophils Relative: 1 %
Eosinophils Absolute: 0.1 K/uL (ref 0.0–0.5)
Eosinophils Relative: 1 %
HCT: 45.5 % (ref 39.0–52.0)
Hemoglobin: 15.2 g/dL (ref 13.0–17.0)
Immature Granulocytes: 0 %
Lymphocytes Relative: 34 %
Lymphs Abs: 3.7 K/uL (ref 0.7–4.0)
MCH: 27 pg (ref 26.0–34.0)
MCHC: 33.4 g/dL (ref 30.0–36.0)
MCV: 80.7 fL (ref 80.0–100.0)
Monocytes Absolute: 0.9 K/uL (ref 0.1–1.0)
Monocytes Relative: 9 %
Neutro Abs: 5.9 K/uL (ref 1.7–7.7)
Neutrophils Relative %: 55 %
Platelet Count: 191 K/uL (ref 150–400)
RBC: 5.64 MIL/uL (ref 4.22–5.81)
RDW: 15.7 % — ABNORMAL HIGH (ref 11.5–15.5)
WBC Count: 10.7 K/uL — ABNORMAL HIGH (ref 4.0–10.5)
nRBC: 0 % (ref 0.0–0.2)

## 2024-08-23 LAB — COMPREHENSIVE METABOLIC PANEL WITH GFR
ALT: 20 U/L (ref 0–44)
AST: 15 U/L (ref 15–41)
Albumin: 4 g/dL (ref 3.5–5.0)
Alkaline Phosphatase: 57 U/L (ref 38–126)
Anion gap: 8 (ref 5–15)
BUN: 18 mg/dL (ref 6–20)
CO2: 23 mmol/L (ref 22–32)
Calcium: 8.6 mg/dL — ABNORMAL LOW (ref 8.9–10.3)
Chloride: 106 mmol/L (ref 98–111)
Creatinine, Ser: 0.7 mg/dL (ref 0.61–1.24)
GFR, Estimated: >60 mL/min (ref >60)
Glucose, Bld: 113 mg/dL — ABNORMAL HIGH (ref 70–99)
Potassium: 3.8 mmol/L (ref 3.5–5.1)
Sodium: 137 mmol/L (ref 135–145)
Total Bilirubin: 0.6 mg/dL (ref 0.0–1.2)
Total Protein: 6.2 g/dL — ABNORMAL LOW (ref 6.5–8.1)

## 2024-08-23 MED ORDER — DIPHENHYDRAMINE HCL 25 MG PO CAPS
50.0000 mg | ORAL_CAPSULE | Freq: Once | ORAL | Status: AC
  Administered 2024-08-23: 50 mg via ORAL
  Filled 2024-08-23: qty 2

## 2024-08-23 MED ORDER — DARATUMUMAB-HYALURONIDASE-FIHJ 1800-30000 MG-UT/15ML ~~LOC~~ SOLN
1800.0000 mg | Freq: Once | SUBCUTANEOUS | Status: AC
  Administered 2024-08-23: 1800 mg via SUBCUTANEOUS
  Filled 2024-08-23: qty 15

## 2024-08-23 MED ORDER — MONTELUKAST SODIUM 10 MG PO TABS
10.0000 mg | ORAL_TABLET | Freq: Once | ORAL | Status: AC
  Administered 2024-08-23: 10 mg via ORAL
  Filled 2024-08-23: qty 1

## 2024-08-23 MED ORDER — DEXAMETHASONE 4 MG PO TABS
2.0000 mg | ORAL_TABLET | Freq: Once | ORAL | Status: AC
  Administered 2024-08-23: 2 mg via ORAL
  Filled 2024-08-23: qty 1

## 2024-08-23 MED ORDER — FAMOTIDINE 20 MG PO TABS
20.0000 mg | ORAL_TABLET | Freq: Once | ORAL | Status: AC
  Administered 2024-08-23: 20 mg via ORAL
  Filled 2024-08-23: qty 1

## 2024-08-23 MED ORDER — ACETAMINOPHEN 325 MG PO TABS
650.0000 mg | ORAL_TABLET | Freq: Once | ORAL | Status: AC
  Administered 2024-08-23: 650 mg via ORAL
  Filled 2024-08-23: qty 2

## 2024-08-23 NOTE — Patient Instructions (Signed)

## 2024-08-25 ENCOUNTER — Other Ambulatory Visit: Payer: Self-pay | Admitting: Hematology

## 2024-08-25 DIAGNOSIS — C9000 Multiple myeloma not having achieved remission: Secondary | ICD-10-CM

## 2024-08-26 ENCOUNTER — Encounter: Payer: Self-pay | Admitting: Hematology

## 2024-08-26 ENCOUNTER — Other Ambulatory Visit: Payer: Self-pay

## 2024-08-26 LAB — MULTIPLE MYELOMA PANEL, SERUM
Albumin SerPl Elph-Mcnc: 3.6 g/dL (ref 2.9–4.4)
Albumin/Glob SerPl: 1.6 (ref 0.7–1.7)
Alpha 1: 0.3 g/dL (ref 0.0–0.4)
Alpha2 Glob SerPl Elph-Mcnc: 0.7 g/dL (ref 0.4–1.0)
B-Globulin SerPl Elph-Mcnc: 0.9 g/dL (ref 0.7–1.3)
Gamma Glob SerPl Elph-Mcnc: 0.6 g/dL (ref 0.4–1.8)
Globulin, Total: 2.4 g/dL (ref 2.2–3.9)
IgA: 123 mg/dL (ref 90–386)
IgG (Immunoglobin G), Serum: 678 mg/dL (ref 603–1613)
IgM (Immunoglobulin M), Srm: 29 mg/dL (ref 20–172)
M Protein SerPl Elph-Mcnc: 0.3 g/dL — ABNORMAL HIGH
Total Protein ELP: 6 g/dL (ref 6.0–8.5)

## 2024-08-30 ENCOUNTER — Other Ambulatory Visit

## 2024-08-30 ENCOUNTER — Ambulatory Visit: Admitting: Hematology

## 2024-08-30 ENCOUNTER — Ambulatory Visit

## 2024-09-06 ENCOUNTER — Inpatient Hospital Stay: Attending: Hematology

## 2024-09-06 ENCOUNTER — Ambulatory Visit

## 2024-09-06 ENCOUNTER — Inpatient Hospital Stay

## 2024-09-06 ENCOUNTER — Ambulatory Visit: Admitting: Hematology

## 2024-09-06 ENCOUNTER — Other Ambulatory Visit

## 2024-09-06 VITALS — BP 128/88 | HR 70 | Temp 98.2°F | Resp 18 | Ht 66.0 in | Wt 169.8 lb

## 2024-09-06 DIAGNOSIS — Z7961 Long term (current) use of immunomodulator: Secondary | ICD-10-CM | POA: Diagnosis not present

## 2024-09-06 DIAGNOSIS — E8809 Other disorders of plasma-protein metabolism, not elsewhere classified: Secondary | ICD-10-CM | POA: Diagnosis not present

## 2024-09-06 DIAGNOSIS — D751 Secondary polycythemia: Secondary | ICD-10-CM | POA: Diagnosis not present

## 2024-09-06 DIAGNOSIS — R7989 Other specified abnormal findings of blood chemistry: Secondary | ICD-10-CM | POA: Diagnosis not present

## 2024-09-06 DIAGNOSIS — C9001 Multiple myeloma in remission: Secondary | ICD-10-CM

## 2024-09-06 DIAGNOSIS — Z5112 Encounter for antineoplastic immunotherapy: Secondary | ICD-10-CM | POA: Diagnosis present

## 2024-09-06 DIAGNOSIS — Z7189 Other specified counseling: Secondary | ICD-10-CM

## 2024-09-06 DIAGNOSIS — Z7985 Long-term (current) use of injectable non-insulin antidiabetic drugs: Secondary | ICD-10-CM | POA: Insufficient documentation

## 2024-09-06 DIAGNOSIS — G629 Polyneuropathy, unspecified: Secondary | ICD-10-CM | POA: Diagnosis not present

## 2024-09-06 DIAGNOSIS — D649 Anemia, unspecified: Secondary | ICD-10-CM | POA: Diagnosis not present

## 2024-09-06 DIAGNOSIS — C9000 Multiple myeloma not having achieved remission: Secondary | ICD-10-CM | POA: Insufficient documentation

## 2024-09-06 DIAGNOSIS — K0850 Unsatisfactory restoration of tooth, unspecified: Secondary | ICD-10-CM | POA: Diagnosis not present

## 2024-09-06 LAB — COMPREHENSIVE METABOLIC PANEL WITH GFR
ALT: 15 U/L (ref 0–44)
AST: 13 U/L — ABNORMAL LOW (ref 15–41)
Albumin: 3.9 g/dL (ref 3.5–5.0)
Alkaline Phosphatase: 53 U/L (ref 38–126)
Anion gap: 6 (ref 5–15)
BUN: 13 mg/dL (ref 6–20)
CO2: 26 mmol/L (ref 22–32)
Calcium: 8.6 mg/dL — ABNORMAL LOW (ref 8.9–10.3)
Chloride: 106 mmol/L (ref 98–111)
Creatinine, Ser: 0.79 mg/dL (ref 0.61–1.24)
GFR, Estimated: >60 mL/min (ref >60)
Glucose, Bld: 176 mg/dL — ABNORMAL HIGH (ref 70–99)
Potassium: 3.6 mmol/L (ref 3.5–5.1)
Sodium: 138 mmol/L (ref 135–145)
Total Bilirubin: 0.6 mg/dL (ref 0.0–1.2)
Total Protein: 6.1 g/dL — ABNORMAL LOW (ref 6.5–8.1)

## 2024-09-06 LAB — CBC WITH DIFFERENTIAL (CANCER CENTER ONLY)
Abs Immature Granulocytes: 0.02 K/uL (ref 0.00–0.07)
Basophils Absolute: 0.1 K/uL (ref 0.0–0.1)
Basophils Relative: 1 %
Eosinophils Absolute: 0.1 K/uL (ref 0.0–0.5)
Eosinophils Relative: 1 %
HCT: 45.5 % (ref 39.0–52.0)
Hemoglobin: 15.2 g/dL (ref 13.0–17.0)
Immature Granulocytes: 0 %
Lymphocytes Relative: 39 %
Lymphs Abs: 2.9 K/uL (ref 0.7–4.0)
MCH: 26.9 pg (ref 26.0–34.0)
MCHC: 33.4 g/dL (ref 30.0–36.0)
MCV: 80.5 fL (ref 80.0–100.0)
Monocytes Absolute: 0.6 K/uL (ref 0.1–1.0)
Monocytes Relative: 8 %
Neutro Abs: 3.7 K/uL (ref 1.7–7.7)
Neutrophils Relative %: 51 %
Platelet Count: 198 K/uL (ref 150–400)
RBC: 5.65 MIL/uL (ref 4.22–5.81)
RDW: 14.9 % (ref 11.5–15.5)
WBC Count: 7.4 K/uL (ref 4.0–10.5)
nRBC: 0 % (ref 0.0–0.2)

## 2024-09-06 MED ORDER — MONTELUKAST SODIUM 10 MG PO TABS
10.0000 mg | ORAL_TABLET | Freq: Once | ORAL | Status: AC
  Administered 2024-09-06: 10 mg via ORAL
  Filled 2024-09-06: qty 1

## 2024-09-06 MED ORDER — DIPHENHYDRAMINE HCL 25 MG PO CAPS
50.0000 mg | ORAL_CAPSULE | Freq: Once | ORAL | Status: AC
  Administered 2024-09-06: 50 mg via ORAL
  Filled 2024-09-06: qty 2

## 2024-09-06 MED ORDER — ACETAMINOPHEN 325 MG PO TABS
650.0000 mg | ORAL_TABLET | Freq: Once | ORAL | Status: AC
  Administered 2024-09-06: 650 mg via ORAL
  Filled 2024-09-06: qty 2

## 2024-09-06 MED ORDER — DEXAMETHASONE 4 MG PO TABS
2.0000 mg | ORAL_TABLET | Freq: Once | ORAL | Status: AC
  Administered 2024-09-06: 2 mg via ORAL
  Filled 2024-09-06: qty 1

## 2024-09-06 MED ORDER — FAMOTIDINE 20 MG PO TABS
20.0000 mg | ORAL_TABLET | Freq: Once | ORAL | Status: AC
  Administered 2024-09-06: 20 mg via ORAL
  Filled 2024-09-06: qty 1

## 2024-09-06 MED ORDER — DARATUMUMAB-HYALURONIDASE-FIHJ 1800-30000 MG-UT/15ML ~~LOC~~ SOLN
1800.0000 mg | Freq: Once | SUBCUTANEOUS | Status: AC
  Administered 2024-09-06: 1800 mg via SUBCUTANEOUS
  Filled 2024-09-06: qty 15

## 2024-09-06 NOTE — Progress Notes (Signed)
 Pt given Revlimid  on 09/06/24. It is delivered to the [facility]. Pt to start taking this cycle on 09/06/24 - 09/27/24. Pt will take off 09/28/24 - 10/7 /25. New cycle will start on 10/05/24. Pt aware when to start this medication.

## 2024-09-06 NOTE — Patient Instructions (Signed)

## 2024-09-08 ENCOUNTER — Ambulatory Visit (INDEPENDENT_AMBULATORY_CARE_PROVIDER_SITE_OTHER): Admitting: Internal Medicine

## 2024-09-08 ENCOUNTER — Encounter: Payer: Self-pay | Admitting: Internal Medicine

## 2024-09-08 VITALS — BP 124/72 | HR 68 | Temp 97.8°F | Ht 66.0 in | Wt 168.8 lb

## 2024-09-08 DIAGNOSIS — Z758 Other problems related to medical facilities and other health care: Secondary | ICD-10-CM

## 2024-09-08 DIAGNOSIS — Z603 Acculturation difficulty: Secondary | ICD-10-CM

## 2024-09-08 DIAGNOSIS — E1165 Type 2 diabetes mellitus with hyperglycemia: Secondary | ICD-10-CM

## 2024-09-08 DIAGNOSIS — Z794 Long term (current) use of insulin: Secondary | ICD-10-CM | POA: Diagnosis not present

## 2024-09-08 MED ORDER — TIRZEPATIDE 15 MG/0.5ML ~~LOC~~ SOAJ: 15.0000 mg | SUBCUTANEOUS | 4 refills | Status: DC

## 2024-09-08 NOTE — Patient Instructions (Signed)
 It was a pleasure seeing you today! Your health and satisfaction are our top priorities.  Donald Cone, MD  VISIT SUMMARY: You came in today for diabetes management and glucose monitoring. We reviewed your continuous glucose meter data, which shows that your glucose levels are well-controlled. We also discussed your current medications and made some adjustments to your treatment plan.  YOUR PLAN: -TYPE 2 DIABETES MELLITUS WITH HYPERGLYCEMIA: Type 2 diabetes is a condition where your body does not use insulin  properly, leading to high blood sugar levels. Your blood glucose levels are well-controlled with your current medications. We plan to increase your Mounjaro  dose to 15 mg after four weeks on the current dose. You were provided with two continuous glucose sensors to use 10 days before your next appointment to ensure accurate data collection. A microalbumin test confirmed no kidney damage, and we will recheck it today.  -LANGUAGE BARRIER AFFECTING HEALTH CARE: A language barrier can make it difficult to communicate effectively about your health. We used an interpreter during your visit to ensure you understood the information and instructions provided.  INSTRUCTIONS: Please increase your Mounjaro  dose to 15 mg after four weeks on the current dose of 12.5 mg. Use the two continuous glucose sensors provided 10 days before your next appointment. We have ordered a microalbumin test to confirm no kidney damage. Schedule a follow-up appointment in 2.5 to 3 weeks for blood work and an A1c check.  Your Providers PCP: Donald Donald MATSU, MD,  [phone]) Referring Provider: [name], MD,  [phone]) Care Team Provider: [name], MD,  [phone])  NEXT STEPS: [x]  Early Intervention: Schedule sooner appointment, call our on-call services, or go to emergency room if there is any significant Increase in pain or discomfort New or worsening symptoms Sudden or severe changes in  your health [x]  Flexible Follow-Up: We recommend a Return in about 3 weeks (around 09/29/2024). for optimal routine care. This allows for progress monitoring and treatment adjustments. [x]  Preventive Care: Schedule your annual preventive care visit! It's typically covered by insurance and helps identify potential health issues early. [x]  Lab & X-ray Appointments: Incomplete tests scheduled today, or call to schedule. X-rays: [facility] (M-F, 8:30am-noon or 1pm-5pm). [x]  Medical Information Release: Sign a release form at front desk to obtain relevant medical information we don't have.  MAKING THE MOST OF OUR FOCUSED 20 MINUTE APPOINTMENTS: [x]   Clearly state your top concerns at the beginning of the visit to focus our discussion [x]   If you anticipate you will need more time, please inform the front desk during scheduling - we can book multiple appointments in the same week. [x]   If you have transportation problems- use our convenient video appointments or ask about transportation support. [x]   We can get down to business faster if you use MyChart to update information before the visit and submit non-urgent questions before your visit. Thank you for taking the time to provide details through MyChart.  Let our nurse know and she can import this information into your encounter documents.  Arrival and Wait Times: [x]   Arriving on time ensures that everyone receives prompt attention. [x]   Early morning (8a) and afternoon (1p) appointments tend to have shortest wait times. [x]   Unfortunately, we cannot delay appointments for late arrivals or hold slots during phone calls.  Getting Answers and Following Up [x]   Simple Questions & Concerns: For quick questions or basic follow-up after your visit, reach us  at [phone] or MyChart messaging. [  x]  Complex Concerns: If your concern is more complex, scheduling an appointment might be best. Discuss this with the staff to find the most  suitable option. [x]   Lab & Imaging Results: We'll contact you directly if results are abnormal or you don't use MyChart. Most normal results will be on MyChart within 2-3 business days, with a review message from Dr. Jesus. Haven't heard back in 2 weeks? Need results sooner? Contact us  at [phone]. [x]   Referrals: Our referral coordinator will manage specialist referrals. The specialist's office should contact you within 2 weeks to schedule an appointment. Call us  if you haven't heard from them after 2 weeks.  Staying Connected [x]   MyChart: Activate your MyChart for the fastest way to access results and message us . See the last page of this paperwork for instructions on how to activate.  Bring to Your Next Appointment [x]   Medications: Please bring all your medication bottles to your next appointment to ensure we have an accurate record of your prescriptions. [x]   Health Diaries: If you're monitoring any health conditions at home, keeping a diary of your readings can be very helpful for discussions at your next appointment.  Billing [x]   X-ray & Lab Orders: These are billed by separate companies. Contact the invoicing company directly for questions or concerns. [x]   Visit Charges: Discuss any billing inquiries with our administrative services team.  Your Satisfaction Matters [x]   Share Your Experience: We strive for your satisfaction! If you have any complaints, or preferably compliments, please let Dr. Jesus know directly or contact our Practice Administrators, [name] or [employer], by asking at the front desk.   Reviewing Your Records [x]   Review this early draft of your clinical encounter notes below and the final encounter summary tomorrow on MyChart after its been completed.  All orders placed so far are visible here: Type 2 diabetes mellitus with hyperglycemia, with long-term current use of insulin  (HCC) -     Tirzepatide ; Inject 15 mg into the skin once a week.  Start after 4 weeks on 12.5 mg dose.  Dispense: 6 mL; Refill: 4 -     Microalbumin / creatinine urine ratio  Language barrier affecting health care

## 2024-09-08 NOTE — Progress Notes (Signed)
 ==============================  [facility]: [phone]   -- Medical Office Visit --  Patient: Donald Suarez      Age: 58 y.o.       Sex:  male  Date:   09/08/2024 Today's Healthcare Provider: Bernardino KANDICE Cone, MD  ==============================   Chief Complaint: Diabetes  Discussed the use of AI scribe software for clinical note transcription with the patient, who gave verbal consent to proceed.  History of Present Illness  58 year old male with diabetes who presents for diabetes management and glucose monitoring.  He brought his continuous glucose meter, which shows that 90% of the time his glucose levels are within the target range. However, he is unable to view the day-by-day data due to technical issues with the device. Over the past 30 days, his glucose levels were 95% in range, 4% high, and less than 1% very high. Glucose levels tend to spike depending on what and when he eats, with levels around 190-200 mg/dL after eating and 879-869 mg/dL when fasting.  He is currently taking Farxiga , Mounjaro  12.5 mg, and metformin  twice daily.  Background Reviewed: Problem List: has Multiple myeloma in remission (HCC); Counseling regarding advance care planning and goals of care; Hyperviscosity; Type 2 diabetes mellitus with hyperglycemia (HCC); Chronic apical periodontitis; Hypocalcemia; Language barrier affecting health care; History of dental problems; Diabetes mellitus with proteinuria (HCC); Hypertension; Chronic kidney disease; Microcytosis; and Hypogammaglobulinemia (HCC) on their problem list. Past Medical History:  has a past medical history of Abfraction (08/06/2022), Accretions on teeth (08/06/2022), Anemia, Attrition, teeth excessive (08/06/2022), Blurry vision (12/20/2020), Caries (08/06/2022), Defective dental restoration (08/06/2022), Elevated ferritin (08/07/2022), Hyperproteinemia (12/20/2020), Hypoalbuminemia (05/13/2022), Hypocalcemia  (08/19/2022), Hypokalemia (03/09/2023), Hypomagnesemia (12/20/2020), Loose, teeth (08/06/2022), Malocclusion (08/06/2022), Microalbuminuria (08/19/2022), Periodontal disease (08/06/2022), Peripheral neuropathy (09/08/2022), and Teeth missing (08/06/2022). Past Surgical History:   has a past surgical history that includes Appendectomy; [facility] US  Guide Vasc Access Right (05/15/2022); Demaris Fluoro Guide CV Line Right (05/15/2022); [facility]que PATIENT EVAL TECH 0-60 MINS (05/19/2022); and Lanis BONE MARROW BIOPSY & ASPIRATION (02/11/2023). Social History:   reports that he has never smoked. He has never used smokeless tobacco. He reports that he does not drink alcohol and does not use drugs. Family History:  family history is not on file. Allergies:  has no known allergies.   Medication Reconciliation: Current Outpatient Medications on File Prior to Visit  Medication Sig   Accu-Chek Softclix Lancets lancets Use to test blood sugars up to 4 times daily as directed.   acyclovir  (ZOVIRAX ) 400 MG tablet Take 1 tablet (400 mg total) by mouth 2 (two) times daily.   aspirin  EC 81 MG tablet Take 1 tablet (81 mg total) by mouth daily. Swallow whole.   Blood Glucose Monitoring Suppl (ACCU-CHEK GUIDE) w/Device KIT Use to test blood sugars up to 4 times daily as directed.   Cholecalciferol (VITAMIN D3) 125 MCG (5000 UT) CAPS Take 1 capsule (5,000 Units total) by mouth daily.   ciclopirox  (LOPROX ) 0.77 % cream Apply topically 2 (two) times daily.   Continuous Glucose Sensor (FREESTYLE LIBRE 3 PLUS SENSOR) MISC Change sensor every 15 days.   Cyanocobalamin  (B-12) 1000 MCG CAPS Take 1 tablet by mouth daily.   dapagliflozin  propanediol (FARXIGA ) 10 MG TABS tablet Take 1 tablet (10 mg total) by mouth daily before breakfast. sa asar sa hroi   ergocalciferol  (VITAMIN D2) 1.25 MG (50000 UT) capsule Take 1 capsule (50,000 Units total) by mouth once a week.   Finerenone  (  KERENDIA ) 10 MG TABS Take 1 tablet (10 mg total) by mouth daily at 12 noon.    glucose blood (ACCU-CHEK GUIDE) test strip Use to test blood sugars up to 4 times daily as directed.   lenalidomide  (REVLIMID ) 25 MG capsule TAKE 1 CAPSULE BY MOUTH 1 TIME A DAY FOR 21 DAYS ON THEN 7 DAYS OFF.   losartan  (COZAAR ) 25 MG tablet Take 1 tablet (25 mg total) by mouth daily. Start taking if blood pressure is over 130/80.  Protects kidneys and lowers blood pressure.   Menaquinone-7 (VITAMIN K2 ) 100 MCG CAPS Take 1 capsule by mouth daily at 6 (six) AM.   metFORMIN  (GLUCOPHAGE ) 500 MG tablet Take 2 tablets (1,000 mg total) by mouth 2 (two) times daily with a meal. 2 asar 1 wot, 2 wot mnhum  amang 1 hroi   potassium chloride  SA (KLOR-CON  M) 20 MEQ tablet Take 1 tablet (20 mEq total) by mouth daily.   rosuvastatin  (CRESTOR ) 10 MG tablet Take 1 tablet (10 mg total) by mouth daily.   Current Facility-Administered Medications on File Prior to Visit  Medication   clotrimazole  (LOTRIMIN ) 1 % cream   Medications Discontinued During This Encounter  Medication Reason   tirzepatide  (MOUNJARO ) 12.5 MG/0.5ML Pen      Physical Exam:    09/08/2024    8:34 AM 09/06/2024    2:25 PM 09/06/2024   12:45 PM  Vitals with BMI  Height 5' 6  5' 6  Weight 168 lbs 13 oz  169 lbs 13 oz  BMI 27.26  27.42  Systolic 124 128 881  Diastolic 72 88 84  Pulse 68 70 71  Vital signs reviewed.  Nursing notes reviewed. Weight trend reviewed. Physical Activity: Not on file   General Appearance:  No acute distress appreciable.   Well-groomed, healthy-appearing male.  Well proportioned with no abnormal fat distribution.  Good muscle tone. Pulmonary:  Normal work of breathing at rest, no respiratory distress apparent. SpO2: 98 %  Musculoskeletal: All extremities are intact.  Neurological:  Awake, alert, oriented, and engaged.  No obvious focal neurological deficits or cognitive impairments.  Sensorium seems unclouded.   Speech is clear and coherent with logical content. Psychiatric:  Appropriate mood, pleasant and  cooperative demeanor, thoughtful and engaged during the exam   Verbalized to patient: Physical Exam     Results:   Verbalized to patient: Results LABS Continuous glucose monitor: 95% in range, 4% high, <1% very high Microalbumin: Normal, indicating no kidney damage     09/06/2024   12:47 PM 08/23/2024   11:29 AM 08/09/2024   12:28 PM 07/19/2024    9:48 AM  PHQ 2/9 Scores  PHQ - 2 Score 0 0 0 0    {   No results found for any visits on 09/08/24.} Appointment on 09/06/2024  Component Date Value Ref Range Status   WBC Count 09/06/2024 7.4  4.0 - 10.5 K/uL Final   RBC 09/06/2024 5.65  4.22 - 5.81 MIL/uL Final   Hemoglobin 09/06/2024 15.2  13.0 - 17.0 g/dL Final   HCT 90/90/7974 45.5  39.0 - 52.0 % Final   MCV 09/06/2024 80.5  80.0 - 100.0 fL Final   MCH 09/06/2024 26.9  26.0 - 34.0 pg Final   MCHC 09/06/2024 33.4  30.0 - 36.0 g/dL Final   RDW 90/90/7974 14.9  11.5 - 15.5 % Final   Platelet Count 09/06/2024 198  150 - 400 K/uL Final   nRBC 09/06/2024 0.0  0.0 - 0.2 %  Final   Neutrophils Relative % 09/06/2024 51  % Final   Neutro Abs 09/06/2024 3.7  1.7 - 7.7 K/uL Final   Lymphocytes Relative 09/06/2024 39  % Final   Lymphs Abs 09/06/2024 2.9  0.7 - 4.0 K/uL Final   Monocytes Relative 09/06/2024 8  % Final   Monocytes Absolute 09/06/2024 0.6  0.1 - 1.0 K/uL Final   Eosinophils Relative 09/06/2024 1  % Final   Eosinophils Absolute 09/06/2024 0.1  0.0 - 0.5 K/uL Final   Basophils Relative 09/06/2024 1  % Final   Basophils Absolute 09/06/2024 0.1  0.0 - 0.1 K/uL Final   Immature Granulocytes 09/06/2024 0  % Final   Abs Immature Granulocytes 09/06/2024 0.02  0.00 - 0.07 K/uL Final   Sodium 09/06/2024 138  135 - 145 mmol/L Final   Potassium 09/06/2024 3.6  3.5 - 5.1 mmol/L Final   Chloride 09/06/2024 106  98 - 111 mmol/L Final   CO2 09/06/2024 26  22 - 32 mmol/L Final   Glucose, Bld 09/06/2024 176 (H)  70 - 99 mg/dL Final   BUN 90/90/7974 13  6 - 20 mg/dL Final    Creatinine, Ser 09/06/2024 0.79  0.61 - 1.24 mg/dL Final   Calcium  09/06/2024 8.6 (L)  8.9 - 10.3 mg/dL Final   Total Protein 90/90/7974 6.1 (L)  6.5 - 8.1 g/dL Final   Albumin  09/06/2024 3.9  3.5 - 5.0 g/dL Final   AST 90/90/7974 13 (L)  15 - 41 U/L Final   ALT 09/06/2024 15  0 - 44 U/L Final   Alkaline Phosphatase 09/06/2024 53  38 - 126 U/L Final   Total Bilirubin 09/06/2024 0.6  0.0 - 1.2 mg/dL Final   GFR, Estimated 09/06/2024 >60  >60 mL/min Final   Anion gap 09/06/2024 6  5 - 15 Final  Appointment on 08/23/2024  Component Date Value Ref Range Status   WBC Count 08/23/2024 10.7 (H)  4.0 - 10.5 K/uL Final   RBC 08/23/2024 5.64  4.22 - 5.81 MIL/uL Final   Hemoglobin 08/23/2024 15.2  13.0 - 17.0 g/dL Final   HCT 91/73/7974 45.5  39.0 - 52.0 % Final   MCV 08/23/2024 80.7  80.0 - 100.0 fL Final   MCH 08/23/2024 27.0  26.0 - 34.0 pg Final   MCHC 08/23/2024 33.4  30.0 - 36.0 g/dL Final   RDW 91/73/7974 15.7 (H)  11.5 - 15.5 % Final   Platelet Count 08/23/2024 191  150 - 400 K/uL Final   nRBC 08/23/2024 0.0  0.0 - 0.2 % Final   Neutrophils Relative % 08/23/2024 55  % Final   Neutro Abs 08/23/2024 5.9  1.7 - 7.7 K/uL Final   Lymphocytes Relative 08/23/2024 34  % Final   Lymphs Abs 08/23/2024 3.7  0.7 - 4.0 K/uL Final   Monocytes Relative 08/23/2024 9  % Final   Monocytes Absolute 08/23/2024 0.9  0.1 - 1.0 K/uL Final   Eosinophils Relative 08/23/2024 1  % Final   Eosinophils Absolute 08/23/2024 0.1  0.0 - 0.5 K/uL Final   Basophils Relative 08/23/2024 1  % Final   Basophils Absolute 08/23/2024 0.1  0.0 - 0.1 K/uL Final   Immature Granulocytes 08/23/2024 0  % Final   Abs Immature Granulocytes 08/23/2024 0.02  0.00 - 0.07 K/uL Final   Sodium 08/23/2024 137  135 - 145 mmol/L Final   Potassium 08/23/2024 3.8  3.5 - 5.1 mmol/L Final   Chloride 08/23/2024 106  98 - 111 mmol/L Final  CO2 08/23/2024 23  22 - 32 mmol/L Final   Glucose, Bld 08/23/2024 113 (H)  70 - 99 mg/dL Final   BUN  91/73/7974 18  6 - 20 mg/dL Final   Creatinine, Ser 08/23/2024 0.70  0.61 - 1.24 mg/dL Final   Calcium  08/23/2024 8.6 (L)  8.9 - 10.3 mg/dL Final   Total Protein 91/73/7974 6.2 (L)  6.5 - 8.1 g/dL Final   Albumin  08/23/2024 4.0  3.5 - 5.0 g/dL Final   AST 91/73/7974 15  15 - 41 U/L Final   ALT 08/23/2024 20  0 - 44 U/L Final   Alkaline Phosphatase 08/23/2024 57  38 - 126 U/L Final   Total Bilirubin 08/23/2024 0.6  0.0 - 1.2 mg/dL Final   GFR, Estimated 08/23/2024 >60  >60 mL/min Final   Anion gap 08/23/2024 8  5 - 15 Final   IgG (Immunoglobin G), Serum 08/23/2024 678  603 - 1,613 mg/dL Final   IgA 91/73/7974 123  90 - 386 mg/dL Final   IgM (Immunoglobulin M), Srm 08/23/2024 29  20 - 172 mg/dL Final   Total Protein ELP 08/23/2024 6.0  6.0 - 8.5 g/dL Corrected   Albumin  SerPl Elph-Mcnc 08/23/2024 3.6  2.9 - 4.4 g/dL Corrected   Alpha 1 91/73/7974 0.3  0.0 - 0.4 g/dL Corrected   Alpha2 Glob SerPl Elph-Mcnc 08/23/2024 0.7  0.4 - 1.0 g/dL Corrected   B-Globulin SerPl Elph-Mcnc 08/23/2024 0.9  0.7 - 1.3 g/dL Corrected   Gamma Glob SerPl Elph-Mcnc 08/23/2024 0.6  0.4 - 1.8 g/dL Corrected   M Protein SerPl Elph-Mcnc 08/23/2024 0.3 (H)  Not Observed g/dL Corrected   Globulin, Total 08/23/2024 2.4  2.2 - 3.9 g/dL Corrected   Albumin /Glob SerPl 08/23/2024 1.6  0.7 - 1.7 Corrected   IFE 1 08/23/2024 Comment (A)   Corrected   Please Note 08/23/2024 Comment   Corrected  Appointment on 08/09/2024  Component Date Value Ref Range Status   WBC Count 08/09/2024 8.9  4.0 - 10.5 K/uL Final   RBC 08/09/2024 5.88 (H)  4.22 - 5.81 MIL/uL Final   Hemoglobin 08/09/2024 15.9  13.0 - 17.0 g/dL Final   HCT 91/87/7974 46.9  39.0 - 52.0 % Final   MCV 08/09/2024 79.8 (L)  80.0 - 100.0 fL Final   MCH 08/09/2024 27.0  26.0 - 34.0 pg Final   MCHC 08/09/2024 33.9  30.0 - 36.0 g/dL Final   RDW 91/87/7974 15.9 (H)  11.5 - 15.5 % Final   Platelet Count 08/09/2024 182  150 - 400 K/uL Final   nRBC 08/09/2024 0.0  0.0 -  0.2 % Final   Neutrophils Relative % 08/09/2024 45  % Final   Neutro Abs 08/09/2024 4.0  1.7 - 7.7 K/uL Final   Lymphocytes Relative 08/09/2024 42  % Final   Lymphs Abs 08/09/2024 3.7  0.7 - 4.0 K/uL Final   Monocytes Relative 08/09/2024 11  % Final   Monocytes Absolute 08/09/2024 1.0  0.1 - 1.0 K/uL Final   Eosinophils Relative 08/09/2024 1  % Final   Eosinophils Absolute 08/09/2024 0.1  0.0 - 0.5 K/uL Final   Basophils Relative 08/09/2024 1  % Final   Basophils Absolute 08/09/2024 0.1  0.0 - 0.1 K/uL Final   Immature Granulocytes 08/09/2024 0  % Final   Abs Immature Granulocytes 08/09/2024 0.02  0.00 - 0.07 K/uL Final   Sodium 08/09/2024 139  135 - 145 mmol/L Final   Potassium 08/09/2024 3.9  3.5 - 5.1 mmol/L Final  Chloride 08/09/2024 106  98 - 111 mmol/L Final   CO2 08/09/2024 27  22 - 32 mmol/L Final   Glucose, Bld 08/09/2024 112 (H)  70 - 99 mg/dL Final   BUN 91/87/7974 12  6 - 20 mg/dL Final   Creatinine, Ser 08/09/2024 0.69  0.61 - 1.24 mg/dL Final   Calcium  08/09/2024 8.8 (L)  8.9 - 10.3 mg/dL Final   Total Protein 91/87/7974 6.5  6.5 - 8.1 g/dL Final   Albumin  08/09/2024 4.3  3.5 - 5.0 g/dL Final   AST 91/87/7974 18  15 - 41 U/L Final   ALT 08/09/2024 23  0 - 44 U/L Final   Alkaline Phosphatase 08/09/2024 62  38 - 126 U/L Final   Total Bilirubin 08/09/2024 1.0  0.0 - 1.2 mg/dL Final   GFR, Estimated 08/09/2024 >60  >60 mL/min Final   Anion gap 08/09/2024 6  5 - 15 Final   IgG (Immunoglobin G), Serum 08/09/2024 738  603 - 1,613 mg/dL Final   IgA 91/87/7974 144  90 - 386 mg/dL Final   IgM (Immunoglobulin M), Srm 08/09/2024 34  20 - 172 mg/dL Final   Total Protein ELP 08/09/2024 6.1  6.0 - 8.5 g/dL Corrected   Albumin  SerPl Elph-Mcnc 08/09/2024 3.4  2.9 - 4.4 g/dL Corrected   Alpha 1 91/87/7974 0.1  0.0 - 0.4 g/dL Corrected   Alpha2 Glob SerPl Elph-Mcnc 08/09/2024 0.9  0.4 - 1.0 g/dL Corrected   B-Globulin SerPl Elph-Mcnc 08/09/2024 0.9  0.7 - 1.3 g/dL Corrected   Gamma  Glob SerPl Elph-Mcnc 08/09/2024 0.7  0.4 - 1.8 g/dL Corrected   M Protein SerPl Elph-Mcnc 08/09/2024 0.3 (H)  Not Observed g/dL Corrected   Globulin, Total 08/09/2024 2.7  2.2 - 3.9 g/dL Corrected   Albumin /Glob SerPl 08/09/2024 1.3  0.7 - 1.7 Corrected   IFE 1 08/09/2024 Comment (A)   Corrected   Please Note 08/09/2024 Comment   Corrected  Appointment on 07/19/2024  Component Date Value Ref Range Status   WBC Count 07/19/2024 6.4  4.0 - 10.5 K/uL Final   RBC 07/19/2024 5.37  4.22 - 5.81 MIL/uL Final   Hemoglobin 07/19/2024 14.5  13.0 - 17.0 g/dL Final   HCT 92/77/7974 42.3  39.0 - 52.0 % Final   MCV 07/19/2024 78.8 (L)  80.0 - 100.0 fL Final   MCH 07/19/2024 27.0  26.0 - 34.0 pg Final   MCHC 07/19/2024 34.3  30.0 - 36.0 g/dL Final   RDW 92/77/7974 15.9 (H)  11.5 - 15.5 % Final   Platelet Count 07/19/2024 163  150 - 400 K/uL Final   nRBC 07/19/2024 0.0  0.0 - 0.2 % Final   Neutrophils Relative % 07/19/2024 41  % Final   Neutro Abs 07/19/2024 2.6  1.7 - 7.7 K/uL Final   Lymphocytes Relative 07/19/2024 43  % Final   Lymphs Abs 07/19/2024 2.8  0.7 - 4.0 K/uL Final   Monocytes Relative 07/19/2024 10  % Final   Monocytes Absolute 07/19/2024 0.6  0.1 - 1.0 K/uL Final   Eosinophils Relative 07/19/2024 5  % Final   Eosinophils Absolute 07/19/2024 0.3  0.0 - 0.5 K/uL Final   Basophils Relative 07/19/2024 1  % Final   Basophils Absolute 07/19/2024 0.0  0.0 - 0.1 K/uL Final   Immature Granulocytes 07/19/2024 0  % Final   Abs Immature Granulocytes 07/19/2024 0.02  0.00 - 0.07 K/uL Final   Sodium 07/19/2024 140  135 - 145 mmol/L Final   Potassium  07/19/2024 3.7  3.5 - 5.1 mmol/L Final   Chloride 07/19/2024 106  98 - 111 mmol/L Final   CO2 07/19/2024 26  22 - 32 mmol/L Final   Glucose, Bld 07/19/2024 145 (H)  70 - 99 mg/dL Final   BUN 92/77/7974 11  6 - 20 mg/dL Final   Creatinine, Ser 07/19/2024 0.75  0.61 - 1.24 mg/dL Final   Calcium  07/19/2024 8.6 (L)  8.9 - 10.3 mg/dL Final   Total  Protein 07/19/2024 6.1 (L)  6.5 - 8.1 g/dL Final   Albumin  07/19/2024 3.7  3.5 - 5.0 g/dL Final   AST 92/77/7974 14 (L)  15 - 41 U/L Final   ALT 07/19/2024 14  0 - 44 U/L Final   Alkaline Phosphatase 07/19/2024 49  38 - 126 U/L Final   Total Bilirubin 07/19/2024 0.7  0.0 - 1.2 mg/dL Final   GFR, Estimated 07/19/2024 >60  >60 mL/min Final   Anion gap 07/19/2024 8  5 - 15 Final   IgG (Immunoglobin G), Serum 07/19/2024 654  603 - 1,613 mg/dL Final   IgA 92/77/7974 133  90 - 386 mg/dL Final   IgM (Immunoglobulin M), Srm 07/19/2024 21  20 - 172 mg/dL Final   Total Protein ELP 07/19/2024 5.7 (L)  6.0 - 8.5 g/dL Corrected   Albumin  SerPl Elph-Mcnc 07/19/2024 3.3  2.9 - 4.4 g/dL Corrected   Alpha 1 92/77/7974 0.1  0.0 - 0.4 g/dL Corrected   Alpha2 Glob SerPl Elph-Mcnc 07/19/2024 0.9  0.4 - 1.0 g/dL Corrected   B-Globulin SerPl Elph-Mcnc 07/19/2024 0.7  0.7 - 1.3 g/dL Corrected   Gamma Glob SerPl Elph-Mcnc 07/19/2024 0.7  0.4 - 1.8 g/dL Corrected   M Protein SerPl Elph-Mcnc 07/19/2024 0.3 (H)  Not Observed g/dL Corrected   Globulin, Total 07/19/2024 2.4  2.2 - 3.9 g/dL Corrected   Albumin /Glob SerPl 07/19/2024 1.4  0.7 - 1.7 Corrected   IFE 1 07/19/2024 Comment (A)   Corrected   Please Note 07/19/2024 Comment   Corrected  Appointment on 07/05/2024  Component Date Value Ref Range Status   WBC Count 07/05/2024 7.0  4.0 - 10.5 K/uL Final   RBC 07/05/2024 5.42  4.22 - 5.81 MIL/uL Final   Hemoglobin 07/05/2024 14.5  13.0 - 17.0 g/dL Final   HCT 92/91/7974 42.9  39.0 - 52.0 % Final   MCV 07/05/2024 79.2 (L)  80.0 - 100.0 fL Final   MCH 07/05/2024 26.8  26.0 - 34.0 pg Final   MCHC 07/05/2024 33.8  30.0 - 36.0 g/dL Final   RDW 92/91/7974 15.9 (H)  11.5 - 15.5 % Final   Platelet Count 07/05/2024 164  150 - 400 K/uL Final   nRBC 07/05/2024 0.0  0.0 - 0.2 % Final   Neutrophils Relative % 07/05/2024 45  % Final   Neutro Abs 07/05/2024 3.2  1.7 - 7.7 K/uL Final   Lymphocytes Relative 07/05/2024 43  %  Final   Lymphs Abs 07/05/2024 3.0  0.7 - 4.0 K/uL Final   Monocytes Relative 07/05/2024 7  % Final   Monocytes Absolute 07/05/2024 0.5  0.1 - 1.0 K/uL Final   Eosinophils Relative 07/05/2024 4  % Final   Eosinophils Absolute 07/05/2024 0.3  0.0 - 0.5 K/uL Final   Basophils Relative 07/05/2024 1  % Final   Basophils Absolute 07/05/2024 0.0  0.0 - 0.1 K/uL Final   Immature Granulocytes 07/05/2024 0  % Final   Abs Immature Granulocytes 07/05/2024 0.02  0.00 - 0.07 K/uL Final  Sodium 07/05/2024 139  135 - 145 mmol/L Final   Potassium 07/05/2024 3.4 (L)  3.5 - 5.1 mmol/L Final   Chloride 07/05/2024 105  98 - 111 mmol/L Final   CO2 07/05/2024 26  22 - 32 mmol/L Final   Glucose, Bld 07/05/2024 139 (H)  70 - 99 mg/dL Final   BUN 92/91/7974 14  6 - 20 mg/dL Final   Creatinine, Ser 07/05/2024 0.77  0.61 - 1.24 mg/dL Final   Calcium  07/05/2024 8.6 (L)  8.9 - 10.3 mg/dL Final   Total Protein 92/91/7974 5.9 (L)  6.5 - 8.1 g/dL Final   Albumin  07/05/2024 3.8  3.5 - 5.0 g/dL Final   AST 92/91/7974 13 (L)  15 - 41 U/L Final   ALT 07/05/2024 18  0 - 44 U/L Final   Alkaline Phosphatase 07/05/2024 57  38 - 126 U/L Final   Total Bilirubin 07/05/2024 0.8  0.0 - 1.2 mg/dL Final   GFR, Estimated 07/05/2024 >60  >60 mL/min Final   Anion gap 07/05/2024 8  5 - 15 Final   IgG (Immunoglobin G), Serum 07/05/2024 664  603 - 1,613 mg/dL Final   IgA 92/91/7974 139  90 - 386 mg/dL Final   IgM (Immunoglobulin M), Srm 07/05/2024 21  20 - 172 mg/dL Final   Total Protein ELP 07/05/2024 5.8 (L)  6.0 - 8.5 g/dL Corrected   Albumin  SerPl Elph-Mcnc 07/05/2024 3.6  2.9 - 4.4 g/dL Corrected   Alpha 1 92/91/7974 0.1  0.0 - 0.4 g/dL Corrected   Alpha2 Glob SerPl Elph-Mcnc 07/05/2024 0.8  0.4 - 1.0 g/dL Corrected   B-Globulin SerPl Elph-Mcnc 07/05/2024 0.8  0.7 - 1.3 g/dL Corrected   Gamma Glob SerPl Elph-Mcnc 07/05/2024 0.6  0.4 - 1.8 g/dL Corrected   M Protein SerPl Elph-Mcnc 07/05/2024 0.3 (H)  Not Observed g/dL Corrected    Globulin, Total 07/05/2024 2.2  2.2 - 3.9 g/dL Corrected   Albumin /Glob SerPl 07/05/2024 1.7  0.7 - 1.7 Corrected   IFE 1 07/05/2024 Comment (A)   Corrected   Please Note 07/05/2024 Comment   Corrected  Office Visit on 06/24/2024  Component Date Value Ref Range Status   Magnesium 06/24/2024 1.8  1.5 - 2.5 mg/dL Final   VITD 93/72/7974 16.19 (L)  30.00 - 100.00 ng/mL Final   Microalb, Ur 06/24/2024 <0.7  mg/dL Final   Creatinine,U 93/72/7974 79.0  mg/dL Final   Microalb Creat Ratio 06/24/2024 Unable to calculate  0.0 - 30.0 mg/g Final   Hgb A1c MFr Bld 06/24/2024 7.1 (H)  4.6 - 6.5 % Final  Appointment on 06/10/2024  Component Date Value Ref Range Status   WBC Count 06/10/2024 6.5  4.0 - 10.5 K/uL Final   RBC 06/10/2024 5.12  4.22 - 5.81 MIL/uL Final   Hemoglobin 06/10/2024 13.5  13.0 - 17.0 g/dL Final   HCT 93/86/7974 39.9  39.0 - 52.0 % Final   MCV 06/10/2024 77.9 (L)  80.0 - 100.0 fL Final   MCH 06/10/2024 26.4  26.0 - 34.0 pg Final   MCHC 06/10/2024 33.8  30.0 - 36.0 g/dL Final   RDW 93/86/7974 15.7 (H)  11.5 - 15.5 % Final   Platelet Count 06/10/2024 154  150 - 400 K/uL Final   nRBC 06/10/2024 0.0  0.0 - 0.2 % Final   Neutrophils Relative % 06/10/2024 43  % Final   Neutro Abs 06/10/2024 2.9  1.7 - 7.7 K/uL Final   Lymphocytes Relative 06/10/2024 35  % Final   Lymphs  Abs 06/10/2024 2.2  0.7 - 4.0 K/uL Final   Monocytes Relative 06/10/2024 10  % Final   Monocytes Absolute 06/10/2024 0.7  0.1 - 1.0 K/uL Final   Eosinophils Relative 06/10/2024 10  % Final   Eosinophils Absolute 06/10/2024 0.6 (H)  0.0 - 0.5 K/uL Final   Basophils Relative 06/10/2024 2  % Final   Basophils Absolute 06/10/2024 0.1  0.0 - 0.1 K/uL Final   Immature Granulocytes 06/10/2024 0  % Final   Abs Immature Granulocytes 06/10/2024 0.01  0.00 - 0.07 K/uL Final   Sodium 06/10/2024 141  135 - 145 mmol/L Final   Potassium 06/10/2024 3.1 (L)  3.5 - 5.1 mmol/L Final   Chloride 06/10/2024 111  98 - 111 mmol/L  Final   CO2 06/10/2024 25  22 - 32 mmol/L Final   Glucose, Bld 06/10/2024 153 (H)  70 - 99 mg/dL Final   BUN 93/86/7974 14  6 - 20 mg/dL Final   Creatinine, Ser 06/10/2024 0.68  0.61 - 1.24 mg/dL Final   Calcium  06/10/2024 8.2 (L)  8.9 - 10.3 mg/dL Final   Total Protein 93/86/7974 5.9 (L)  6.5 - 8.1 g/dL Final   Albumin  06/10/2024 3.8  3.5 - 5.0 g/dL Final   AST 93/86/7974 17  15 - 41 U/L Final   ALT 06/10/2024 27  0 - 44 U/L Final   Alkaline Phosphatase 06/10/2024 56  38 - 126 U/L Final   Total Bilirubin 06/10/2024 0.7  0.0 - 1.2 mg/dL Final   GFR, Estimated 06/10/2024 >60  >60 mL/min Final   Anion gap 06/10/2024 5  5 - 15 Final   IgG (Immunoglobin G), Serum 06/10/2024 570 (L)  603 - 1,613 mg/dL Final   IgA 93/86/7974 133  90 - 386 mg/dL Final   IgM (Immunoglobulin M), Srm 06/10/2024 15 (L)  20 - 172 mg/dL Final   Total Protein ELP 06/10/2024 5.6 (L)  6.0 - 8.5 g/dL Corrected   Albumin  SerPl Elph-Mcnc 06/10/2024 3.2  2.9 - 4.4 g/dL Corrected   Alpha 1 93/86/7974 0.1  0.0 - 0.4 g/dL Corrected   Alpha2 Glob SerPl Elph-Mcnc 06/10/2024 0.9  0.4 - 1.0 g/dL Corrected   B-Globulin SerPl Elph-Mcnc 06/10/2024 0.9  0.7 - 1.3 g/dL Corrected   Gamma Glob SerPl Elph-Mcnc 06/10/2024 0.6  0.4 - 1.8 g/dL Corrected   M Protein SerPl Elph-Mcnc 06/10/2024 0.2 (H)  Not Observed g/dL Corrected   Globulin, Total 06/10/2024 2.4  2.2 - 3.9 g/dL Corrected   Albumin /Glob SerPl 06/10/2024 1.4  0.7 - 1.7 Corrected   IFE 1 06/10/2024 Comment (A)   Corrected   Please Note 06/10/2024 Comment   Corrected  Appointment on 06/03/2024  Component Date Value Ref Range Status   WBC Count 06/03/2024 5.6  4.0 - 10.5 K/uL Final   RBC 06/03/2024 5.15  4.22 - 5.81 MIL/uL Final   Hemoglobin 06/03/2024 13.7  13.0 - 17.0 g/dL Final   HCT 93/93/7974 40.2  39.0 - 52.0 % Final   MCV 06/03/2024 78.1 (L)  80.0 - 100.0 fL Final   MCH 06/03/2024 26.6  26.0 - 34.0 pg Final   MCHC 06/03/2024 34.1  30.0 - 36.0 g/dL Final   RDW  93/93/7974 15.8 (H)  11.5 - 15.5 % Final   Platelet Count 06/03/2024 148 (L)  150 - 400 K/uL Final   nRBC 06/03/2024 0.0  0.0 - 0.2 % Final   Neutrophils Relative % 06/03/2024 34  % Final   Neutro Abs 06/03/2024 1.9  1.7 - 7.7 K/uL Final   Lymphocytes Relative 06/03/2024 43  % Final   Lymphs Abs 06/03/2024 2.4  0.7 - 4.0 K/uL Final   Monocytes Relative 06/03/2024 12  % Final   Monocytes Absolute 06/03/2024 0.7  0.1 - 1.0 K/uL Final   Eosinophils Relative 06/03/2024 10  % Final   Eosinophils Absolute 06/03/2024 0.5  0.0 - 0.5 K/uL Final   Basophils Relative 06/03/2024 1  % Final   Basophils Absolute 06/03/2024 0.1  0.0 - 0.1 K/uL Final   Immature Granulocytes 06/03/2024 0  % Final   Abs Immature Granulocytes 06/03/2024 0.01  0.00 - 0.07 K/uL Final   Sodium 06/03/2024 142  135 - 145 mmol/L Final   Potassium 06/03/2024 3.3 (L)  3.5 - 5.1 mmol/L Final   Chloride 06/03/2024 110  98 - 111 mmol/L Final   CO2 06/03/2024 25  22 - 32 mmol/L Final   Glucose, Bld 06/03/2024 147 (H)  70 - 99 mg/dL Final   BUN 93/93/7974 14  6 - 20 mg/dL Final   Creatinine, Ser 06/03/2024 0.80  0.61 - 1.24 mg/dL Final   Calcium  06/03/2024 8.5 (L)  8.9 - 10.3 mg/dL Final   Total Protein 93/93/7974 5.8 (L)  6.5 - 8.1 g/dL Final   Albumin  06/03/2024 3.7  3.5 - 5.0 g/dL Final   AST 93/93/7974 16  15 - 41 U/L Final   ALT 06/03/2024 24  0 - 44 U/L Final   Alkaline Phosphatase 06/03/2024 44  38 - 126 U/L Final   Total Bilirubin 06/03/2024 0.7  0.0 - 1.2 mg/dL Final   GFR, Estimated 06/03/2024 >60  >60 mL/min Final   Anion gap 06/03/2024 7  5 - 15 Final   IgG (Immunoglobin G), Serum 06/03/2024 579 (L)  603 - 1,613 mg/dL Final   IgA 93/93/7974 127  90 - 386 mg/dL Final   IgM (Immunoglobulin M), Srm 06/03/2024 15 (L)  20 - 172 mg/dL Final   Total Protein ELP 06/03/2024 5.6 (L)  6.0 - 8.5 g/dL Corrected   Albumin  SerPl Elph-Mcnc 06/03/2024 3.4  2.9 - 4.4 g/dL Corrected   Alpha 1 93/93/7974 0.1  0.0 - 0.4 g/dL Corrected    Alpha2 Glob SerPl Elph-Mcnc 06/03/2024 0.7  0.4 - 1.0 g/dL Corrected   B-Globulin SerPl Elph-Mcnc 06/03/2024 0.8  0.7 - 1.3 g/dL Corrected   Gamma Glob SerPl Elph-Mcnc 06/03/2024 0.6  0.4 - 1.8 g/dL Corrected   M Protein SerPl Elph-Mcnc 06/03/2024 0.3 (H)  Not Observed g/dL Corrected   Globulin, Total 06/03/2024 2.2  2.2 - 3.9 g/dL Corrected   Albumin /Glob SerPl 06/03/2024 1.6  0.7 - 1.7 Corrected   IFE 1 06/03/2024 Comment (A)   Corrected   Please Note 06/03/2024 Comment   Corrected  Appointment on 05/24/2024  Component Date Value Ref Range Status   Microalb, Ur 05/24/2024 2.7 (H)  0.0 - 1.9 mg/dL Final   Creatinine,U 94/72/7974 98.6  mg/dL Final   Microalb Creat Ratio 05/24/2024 27.4  0.0 - 30.0 mg/g Final  Appointment on 05/13/2024  Component Date Value Ref Range Status   IgG (Immunoglobin G), Serum 05/13/2024 600 (L)  603 - 1,613 mg/dL Final   IgA 94/83/7974 148  90 - 386 mg/dL Final   IgM (Immunoglobulin M), Srm 05/13/2024 22  20 - 172 mg/dL Final   Total Protein ELP 05/13/2024 5.5 (L)  6.0 - 8.5 g/dL Corrected   Albumin  SerPl Elph-Mcnc 05/13/2024 3.2  2.9 - 4.4 g/dL Corrected   Alpha 1  05/13/2024 0.2  0.0 - 0.4 g/dL Corrected   Alpha2 Glob SerPl Elph-Mcnc 05/13/2024 0.7  0.4 - 1.0 g/dL Corrected   B-Globulin SerPl Elph-Mcnc 05/13/2024 0.8  0.7 - 1.3 g/dL Corrected   Gamma Glob SerPl Elph-Mcnc 05/13/2024 0.6  0.4 - 1.8 g/dL Corrected   M Protein SerPl Elph-Mcnc 05/13/2024 0.2 (H)  Not Observed g/dL Corrected   Globulin, Total 05/13/2024 2.3  2.2 - 3.9 g/dL Corrected   Albumin /Glob SerPl 05/13/2024 1.4  0.7 - 1.7 Corrected   IFE 1 05/13/2024 Comment (A)   Corrected   Please Note 05/13/2024 Comment   Corrected   Sodium 05/13/2024 141  135 - 145 mmol/L Final   Potassium 05/13/2024 3.2 (L)  3.5 - 5.1 mmol/L Final   Chloride 05/13/2024 110  98 - 111 mmol/L Final   CO2 05/13/2024 27  22 - 32 mmol/L Final   Glucose, Bld 05/13/2024 147 (H)  70 - 99 mg/dL Final   BUN 94/83/7974 14  6 -  20 mg/dL Final   Creatinine, Ser 05/13/2024 0.71  0.61 - 1.24 mg/dL Final   Calcium  05/13/2024 8.8 (L)  8.9 - 10.3 mg/dL Final   Total Protein 94/83/7974 5.9 (L)  6.5 - 8.1 g/dL Final   Albumin  05/13/2024 3.7  3.5 - 5.0 g/dL Final   AST 94/83/7974 14 (L)  15 - 41 U/L Final   ALT 05/13/2024 14  0 - 44 U/L Final   Alkaline Phosphatase 05/13/2024 46  38 - 126 U/L Final   Total Bilirubin 05/13/2024 0.6  0.0 - 1.2 mg/dL Final   GFR, Estimated 05/13/2024 >60  >60 mL/min Final   Anion gap 05/13/2024 4 (L)  5 - 15 Final   WBC Count 05/13/2024 6.2  4.0 - 10.5 K/uL Final   RBC 05/13/2024 5.27  4.22 - 5.81 MIL/uL Final   Hemoglobin 05/13/2024 13.9  13.0 - 17.0 g/dL Final   HCT 94/83/7974 41.1  39.0 - 52.0 % Final   MCV 05/13/2024 78.0 (L)  80.0 - 100.0 fL Final   MCH 05/13/2024 26.4  26.0 - 34.0 pg Final   MCHC 05/13/2024 33.8  30.0 - 36.0 g/dL Final   RDW 94/83/7974 15.1  11.5 - 15.5 % Final   Platelet Count 05/13/2024 208  150 - 400 K/uL Final   nRBC 05/13/2024 0.0  0.0 - 0.2 % Final   Neutrophils Relative % 05/13/2024 46  % Final   Neutro Abs 05/13/2024 2.9  1.7 - 7.7 K/uL Final   Lymphocytes Relative 05/13/2024 37  % Final   Lymphs Abs 05/13/2024 2.3  0.7 - 4.0 K/uL Final   Monocytes Relative 05/13/2024 9  % Final   Monocytes Absolute 05/13/2024 0.6  0.1 - 1.0 K/uL Final   Eosinophils Relative 05/13/2024 5  % Final   Eosinophils Absolute 05/13/2024 0.3  0.0 - 0.5 K/uL Final   Basophils Relative 05/13/2024 3  % Final   Basophils Absolute 05/13/2024 0.2 (H)  0.0 - 0.1 K/uL Final   Immature Granulocytes 05/13/2024 0  % Final   Abs Immature Granulocytes 05/13/2024 0.02  0.00 - 0.07 K/uL Final  There may be more visits with results that are not included.  No image results found. No results found.       ASSESSMENT & PLAN   Assessment & Plan Type 2 diabetes mellitus with hyperglycemia, with long-term current use of insulin  (HCC) Type 2 diabetes mellitus with hyperglycemia (E11.65: Type 2  diabetes mellitus with hyperglycemia) Blood glucose levels are well-controlled  with 92% of readings in range over the past 14 days and 95% in range over the past 30 days. Current medications include Farxiga , Mounjaro  12.5 mg, and Metformin . Plan to increase Mounjaro  to 15 mg after four weeks on the current dose. Discussed the importance of not stopping Mounjaro  to maintain insurance coverage. Provided two continuous glucose sensors for use 10 days before the next appointment to ensure accurate data collection. Microalbumin test confirmed no kidney damage, and plan to recheck today. - Increase Mounjaro  to 15 mg after four weeks on 12.5 mg. - Provide two continuous glucose sensors for use 10 days before the next appointment. - Order microalbumin test to confirm no kidney damage. - Schedule follow-up appointment in 2.5 to 3 weeks for blood work and A1c check. Language barrier affecting health care Language barrier affecting health care (Z60.3) Language barrier present, requiring interpreter assistance for effective communication during the visit.   ORDER ASSOCIATIONS  #   DIAGNOSIS / CONDITION ICD-10 ENCOUNTER ORDER     ICD-10-CM   1. Type 2 diabetes mellitus with hyperglycemia, with long-term current use of insulin  (HCC)  E11.65 tirzepatide  (MOUNJARO ) 15 MG/0.5ML Pen   Z79.4 Microalbumin / creatinine urine ratio    2. Language barrier affecting health care  Z60.3    Z75.8           Orders Placed in Encounter:  Lab Orders         Microalbumin / creatinine urine ratio     Meds ordered this encounter  Medications   tirzepatide  (MOUNJARO ) 15 MG/0.5ML Pen    Sig: Inject 15 mg into the skin once a week. Start after 4 weeks on 12.5 mg dose.    Dispense:  6 mL    Refill:  4   dispensed 2 Dexcom G7 samples with education today .      This document was synthesized by artificial intelligence (Abridge) using HIPAA-compliant recording of the clinical interaction;   We discussed the use of AI  scribe software for clinical note transcription with the patient, who gave verbal consent to proceed. additional Info: This encounter employed state-of-the-art, real-time, collaborative documentation. The patient actively reviewed and assisted in updating their electronic medical record on a shared screen, ensuring transparency and facilitating joint problem-solving for the problem list, overview, and plan. This approach promotes accurate, informed care. The treatment plan was discussed and reviewed in detail, including medication safety, potential side effects, and all patient questions. We confirmed understanding and comfort with the plan. Follow-up instructions were established, including contacting the office for any concerns, returning if symptoms worsen, persist, or new symptoms develop, and precautions for potential emergency department visits.

## 2024-09-08 NOTE — Assessment & Plan Note (Signed)
 Language barrier affecting health care (Z60.3) Language barrier present, requiring interpreter assistance for effective communication during the visit.

## 2024-09-08 NOTE — Assessment & Plan Note (Signed)
 Type 2 diabetes mellitus with hyperglycemia (E11.65: Type 2 diabetes mellitus with hyperglycemia) Blood glucose levels are well-controlled with 92% of readings in range over the past 14 days and 95% in range over the past 30 days. Current medications include Farxiga , Mounjaro  12.5 mg, and Metformin . Plan to increase Mounjaro  to 15 mg after four weeks on the current dose. Discussed the importance of not stopping Mounjaro  to maintain insurance coverage. Provided two continuous glucose sensors for use 10 days before the next appointment to ensure accurate data collection. Microalbumin test confirmed no kidney damage, and plan to recheck today. - Increase Mounjaro  to 15 mg after four weeks on 12.5 mg. - Provide two continuous glucose sensors for use 10 days before the next appointment. - Order microalbumin test to confirm no kidney damage. - Schedule follow-up appointment in 2.5 to 3 weeks for blood work and A1c check.

## 2024-09-12 LAB — MULTIPLE MYELOMA PANEL, SERUM
Albumin SerPl Elph-Mcnc: 3.3 g/dL (ref 2.9–4.4)
Albumin/Glob SerPl: 1.3 (ref 0.7–1.7)
Alpha 1: 0.2 g/dL (ref 0.0–0.4)
Alpha2 Glob SerPl Elph-Mcnc: 0.9 g/dL (ref 0.4–1.0)
B-Globulin SerPl Elph-Mcnc: 0.9 g/dL (ref 0.7–1.3)
Gamma Glob SerPl Elph-Mcnc: 0.7 g/dL (ref 0.4–1.8)
Globulin, Total: 2.7 g/dL (ref 2.2–3.9)
IgA: 124 mg/dL (ref 90–386)
IgG (Immunoglobin G), Serum: 657 mg/dL (ref 603–1613)
IgM (Immunoglobulin M), Srm: 25 mg/dL (ref 20–172)
M Protein SerPl Elph-Mcnc: 0.4 g/dL — ABNORMAL HIGH
Total Protein ELP: 6 g/dL (ref 6.0–8.5)

## 2024-09-13 ENCOUNTER — Ambulatory Visit

## 2024-09-13 ENCOUNTER — Other Ambulatory Visit

## 2024-09-13 ENCOUNTER — Ambulatory Visit: Admitting: Physician Assistant

## 2024-09-20 ENCOUNTER — Inpatient Hospital Stay

## 2024-09-20 ENCOUNTER — Other Ambulatory Visit: Payer: Self-pay

## 2024-09-20 ENCOUNTER — Inpatient Hospital Stay: Admitting: Hematology

## 2024-09-20 VITALS — BP 130/90 | HR 73 | Temp 98.3°F | Resp 16 | Wt 168.0 lb

## 2024-09-20 DIAGNOSIS — C9001 Multiple myeloma in remission: Secondary | ICD-10-CM

## 2024-09-20 DIAGNOSIS — C9000 Multiple myeloma not having achieved remission: Secondary | ICD-10-CM

## 2024-09-20 DIAGNOSIS — Z7189 Other specified counseling: Secondary | ICD-10-CM

## 2024-09-20 DIAGNOSIS — Z5111 Encounter for antineoplastic chemotherapy: Secondary | ICD-10-CM

## 2024-09-20 DIAGNOSIS — Z5112 Encounter for antineoplastic immunotherapy: Secondary | ICD-10-CM | POA: Diagnosis not present

## 2024-09-20 LAB — CBC WITH DIFFERENTIAL (CANCER CENTER ONLY)
Abs Immature Granulocytes: 0.03 K/uL (ref 0.00–0.07)
Basophils Absolute: 0 K/uL (ref 0.0–0.1)
Basophils Relative: 0 %
Eosinophils Absolute: 0.3 K/uL (ref 0.0–0.5)
Eosinophils Relative: 3 %
HCT: 45.9 % (ref 39.0–52.0)
Hemoglobin: 15.7 g/dL (ref 13.0–17.0)
Immature Granulocytes: 0 %
Lymphocytes Relative: 39 %
Lymphs Abs: 3.8 K/uL (ref 0.7–4.0)
MCH: 27.3 pg (ref 26.0–34.0)
MCHC: 34.2 g/dL (ref 30.0–36.0)
MCV: 79.8 fL — ABNORMAL LOW (ref 80.0–100.0)
Monocytes Absolute: 0.7 K/uL (ref 0.1–1.0)
Monocytes Relative: 7 %
Neutro Abs: 4.9 K/uL (ref 1.7–7.7)
Neutrophils Relative %: 51 %
Platelet Count: 169 K/uL (ref 150–400)
RBC: 5.75 MIL/uL (ref 4.22–5.81)
RDW: 15 % (ref 11.5–15.5)
WBC Count: 9.7 K/uL (ref 4.0–10.5)
nRBC: 0 % (ref 0.0–0.2)

## 2024-09-20 LAB — COMPREHENSIVE METABOLIC PANEL WITH GFR
ALT: 18 U/L (ref 0–44)
AST: 14 U/L — ABNORMAL LOW (ref 15–41)
Albumin: 4.1 g/dL (ref 3.5–5.0)
Alkaline Phosphatase: 50 U/L (ref 38–126)
Anion gap: 6 (ref 5–15)
BUN: 11 mg/dL (ref 6–20)
CO2: 26 mmol/L (ref 22–32)
Calcium: 8.6 mg/dL — ABNORMAL LOW (ref 8.9–10.3)
Chloride: 106 mmol/L (ref 98–111)
Creatinine, Ser: 0.74 mg/dL (ref 0.61–1.24)
GFR, Estimated: >60 mL/min (ref >60)
Glucose, Bld: 221 mg/dL — ABNORMAL HIGH (ref 70–99)
Potassium: 3.5 mmol/L (ref 3.5–5.1)
Sodium: 138 mmol/L (ref 135–145)
Total Bilirubin: 0.7 mg/dL (ref 0.0–1.2)
Total Protein: 6.3 g/dL — ABNORMAL LOW (ref 6.5–8.1)

## 2024-09-20 MED ORDER — MONTELUKAST SODIUM 10 MG PO TABS
10.0000 mg | ORAL_TABLET | Freq: Once | ORAL | Status: AC
  Administered 2024-09-20: 10 mg via ORAL
  Filled 2024-09-20: qty 1

## 2024-09-20 MED ORDER — DIPHENHYDRAMINE HCL 25 MG PO CAPS
50.0000 mg | ORAL_CAPSULE | Freq: Once | ORAL | Status: AC
  Administered 2024-09-20: 50 mg via ORAL
  Filled 2024-09-20: qty 2

## 2024-09-20 MED ORDER — DARATUMUMAB-HYALURONIDASE-FIHJ 1800-30000 MG-UT/15ML ~~LOC~~ SOLN
1800.0000 mg | Freq: Once | SUBCUTANEOUS | Status: AC
  Administered 2024-09-20: 1800 mg via SUBCUTANEOUS
  Filled 2024-09-20: qty 15

## 2024-09-20 MED ORDER — DEXAMETHASONE 4 MG PO TABS
2.0000 mg | ORAL_TABLET | Freq: Once | ORAL | Status: AC
  Administered 2024-09-20: 2 mg via ORAL
  Filled 2024-09-20: qty 1

## 2024-09-20 MED ORDER — FAMOTIDINE 20 MG PO TABS
20.0000 mg | ORAL_TABLET | Freq: Once | ORAL | Status: AC
  Administered 2024-09-20: 20 mg via ORAL
  Filled 2024-09-20: qty 1

## 2024-09-20 MED ORDER — ACETAMINOPHEN 325 MG PO TABS
650.0000 mg | ORAL_TABLET | Freq: Once | ORAL | Status: AC
  Administered 2024-09-20: 650 mg via ORAL
  Filled 2024-09-20: qty 2

## 2024-09-20 NOTE — Patient Instructions (Signed)

## 2024-09-22 LAB — MULTIPLE MYELOMA PANEL, SERUM
Albumin SerPl Elph-Mcnc: 3.4 g/dL (ref 2.9–4.4)
Albumin/Glob SerPl: 1.5 (ref 0.7–1.7)
Alpha 1: 0.1 g/dL (ref 0.0–0.4)
Alpha2 Glob SerPl Elph-Mcnc: 0.9 g/dL (ref 0.4–1.0)
B-Globulin SerPl Elph-Mcnc: 0.8 g/dL (ref 0.7–1.3)
Gamma Glob SerPl Elph-Mcnc: 0.6 g/dL (ref 0.4–1.8)
Globulin, Total: 2.4 g/dL (ref 2.2–3.9)
IgA: 121 mg/dL (ref 90–386)
IgG (Immunoglobin G), Serum: 664 mg/dL (ref 603–1613)
IgM (Immunoglobulin M), Srm: 28 mg/dL (ref 20–172)
M Protein SerPl Elph-Mcnc: 0.3 g/dL — ABNORMAL HIGH
Total Protein ELP: 5.8 g/dL — ABNORMAL LOW (ref 6.0–8.5)

## 2024-09-23 ENCOUNTER — Other Ambulatory Visit: Payer: Self-pay | Admitting: Hematology

## 2024-09-23 DIAGNOSIS — C9000 Multiple myeloma not having achieved remission: Secondary | ICD-10-CM

## 2024-09-28 ENCOUNTER — Encounter: Payer: Self-pay | Admitting: Hematology

## 2024-09-28 NOTE — Progress Notes (Signed)
 [name]  HEMATOLOGY/ONCOLOGY CLINIC NOTE  Date of Service: 09/20/2024  Chief complaint - Follow-up for continued valuation and management of high risk multiple myeloma  Current treatment-daratumumab /Revlimid /dexamethasone   INTERVAL HISTORY:  Donald Suarez is a 58 y.o. male is here for continued evaluation and management of his high risk multiple myeloma. He notes no acute new symptoms since his last clinic visit.  No new focal bone pains or other symptoms. 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  Does not note any new fatigue. No notable new toxicities from his current daratumumab  Revlimid  dexamethasone  treatment and Zometa . No new infection issues. No mouth sores.  No diarrhea. No fevers no chills no night sweats no unexpected weight loss.  MEDICAL HISTORY:  Past Medical History:  Diagnosis Date   Abfraction 08/06/2022   Accretions on teeth 08/06/2022   Anemia    Attrition, teeth excessive 08/06/2022   Blurry vision 12/20/2020   yet to see ophthalmology due to language barrier  Blurry vision (ICD-10: H53.8) Potentially related to poor glycemic control Plan: Refer to ophthalmology for diabetic retinopathy screening.     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   Managed by oncology  On revlimid      Hypoalbuminemia 05/13/2022   Lab Results      Component    Value    Date           ALBUMIN     3.7    07/19/2024           ALBUMIN     3.8    07/05/2024           ALBUMIN     3.8    06/10/2024           ALBUMIN     3.7    06/03/2024           ALBUMIN     3.7    05/13/2024           ALBUMIN     4.0    04/29/2024           ALBUMIN     3.9    04/15/2024           ALBUMIN     4.0    03/25/2024           ALBUMIN     4.2    03/11/2024         Hypocalcemia 08/19/2022   In setting of multiple myeloma    Hypokalemia 03/09/2023   Lab Results      Component    Value    Date/Time           K    3.7     07/19/2024 07:46 AM           K    3.4 (L)    07/05/2024 09:33 AM           K    3.1 (L)    06/10/2024 12:04 PM           K    3.3 (L)    06/03/2024 08:41 AM           K    3.2 (L)    05/13/2024 12:13 PM           K    3.0 (L)    04/29/2024 11:07 AM           K  3.3 (L)    04/15/2024 07:42 AM           Hypomagnesemia 12/20/2020   Lab Results      Component    Value    Date/Time           MG    1.8    06/24/2024 08:34 AM           Loose, teeth 08/06/2022   Malocclusion 08/06/2022   Microalbuminuria 08/19/2022   Periodontal disease 08/06/2022   Peripheral neuropathy 09/08/2022   Started 08/2022 A/w diabetes and revlimid  usage Burning on top of left foot   Teeth missing 08/06/2022    SURGICAL HISTORY: Past Surgical History:  Procedure Laterality Date   APPENDECTOMY     [facility]ion BONE MARROW BIOPSY & ASPIRATION  02/11/2023   [facility]ans FLUORO GUIDE CV LINE RIGHT  05/15/2022   [facility] PATIENT EVAL TECH 0-60 MINS  05/19/2022   [facility] US  GUIDE VASC ACCESS RIGHT  05/15/2022    SOCIAL HISTORY: Social History   Socioeconomic History   Marital status: Married    Spouse name: Not on file   Number of children: Not on file   Years of education: Not on file   Highest education level: Not on file  Occupational History   Not on file  Tobacco Use   Smoking status: Never   Smokeless tobacco: Never  Vaping Use   Vaping status: Never Used  Substance and Sexual Activity   Alcohol use: Never   Drug use: Never   Sexual activity: Not on file  Other Topics Concern   Not on file  Social History Narrative   Not on file   Social Drivers of Health   Financial Resource Strain: Medium Risk (05/21/2022)   Overall Financial Resource Strain (CARDIA)    Difficulty of Paying Living Expenses: Somewhat hard  Food Insecurity: Food Insecurity Present (05/21/2022)   Hunger Vital Sign    Worried About Running Out of Food in the Last Year: Sometimes true    Ran Out of Food in the Last Year: Sometimes true  Transportation Needs: No  Transportation Needs (05/21/2022)   PRAPARE - Administrator, Civil Service (Medical): No    Lack of Transportation (Non-Medical): No  Physical Activity: Not on file  Stress: Not on file  Social Connections: Not on file  Intimate Partner Violence: Not on file    FAMILY HISTORY: No family history on file.  ALLERGIES:  has no known allergies.  MEDICATIONS:  . Allergies as of 09/20/2024   No Known Allergies      Medication List        Accurate as of September 20, 2024 11:59 PM. If you have any questions, ask your nurse or doctor.          Accu-Chek Guide test strip Generic drug: glucose blood Use to test blood sugars up to 4 times daily as directed.   Accu-Chek Guide w/Device Kit Use to test blood sugars up to 4 times daily as directed.   Accu-Chek Softclix Lancets lancets Use to test blood sugars up to 4 times daily as directed.   acyclovir  400 MG tablet Commonly known as: ZOVIRAX  Take 1 tablet (400 mg total) by mouth 2 (two) times daily.   aspirin  EC 81 MG tablet Take 1 tablet (81 mg total) by mouth daily. Swallow whole.   B-12 1000 MCG Caps Take 1 tablet by mouth daily.   ciclopirox  0.77 % cream Commonly known as: LOPROX  Apply topically  2 (two) times daily.   dapagliflozin  propanediol 10 MG Tabs tablet Commonly known as: Farxiga  Take 1 tablet (10 mg total) by mouth daily before breakfast. sa asar sa hroi   ergocalciferol  1.25 MG (50000 UT) capsule Commonly known as: VITAMIN D2 Take 1 capsule (50,000 Units total) by mouth once a week.   FreeStyle Libre 3 Plus Sensor Misc Change sensor every 15 days.   Kerendia  10 MG Tabs Generic drug: Finerenone  Take 1 tablet (10 mg total) by mouth daily at 12 noon.   lenalidomide  25 MG capsule Commonly known as: REVLIMID  TAKE 1 CAPSULE BY MOUTH 1 TIME A DAY FOR 21 DAYS ON THEN 7 DAYS OFF.   losartan  25 MG tablet Commonly known as: COZAAR  Take 1 tablet (25 mg total) by mouth daily. Start taking if  blood pressure is over 130/80.  Protects kidneys and lowers blood pressure.   metFORMIN  500 MG tablet Commonly known as: GLUCOPHAGE  Take 2 tablets (1,000 mg total) by mouth 2 (two) times daily with a meal. 2 asar 1 wot, 2 wot mnhum  amang 1 hroi   potassium chloride  SA 20 MEQ tablet Commonly known as: KLOR-CON  M Take 1 tablet (20 mEq total) by mouth daily.   rosuvastatin  10 MG tablet Commonly known as: CRESTOR  Take 1 tablet (10 mg total) by mouth daily.   tirzepatide  15 MG/0.5ML Pen Commonly known as: MOUNJARO  Inject 15 mg into the skin once a week. Start after 4 weeks on 12.5 mg dose.   Vitamin D3 125 MCG (5000 UT) Caps Take 1 capsule (5,000 Units total) by mouth daily.   Vitamin K2  100 MCG Caps Take 1 capsule by mouth daily at 6 (six) AM.         REVIEW OF SYSTEMS:   10 Point review of Systems was done is negative except as noted above.  PHYSICAL EXAMINATION: Vital signs stable NAD GENERAL:alert, in no acute distress and comfortable SKIN: no acute rashes, no significant lesions EYES: conjunctiva are pink and non-injected, sclera anicteric OROPHARYNX: MMM, no exudates, no oropharyngeal erythema or ulceration NECK: supple, no JVD LYMPH:  no palpable lymphadenopathy in the cervical, axillary or inguinal regions LUNGS: clear to auscultation b/l with normal respiratory effort HEART: regular rate & rhythm ABDOMEN:  normoactive bowel sounds , non tender, not distended. Extremity: no pedal edema PSYCH: alert & oriented x 3 with fluent speech NEURO: no focal motor/sensory deficits   LABORATORY DATA:  I have reviewed the data as listed  .    Latest Ref Rng & Units 09/20/2024   11:10 AM 09/06/2024   12:25 PM 08/23/2024   10:45 AM  CBC  WBC 4.0 - 10.5 K/uL 9.7  7.4  10.7   Hemoglobin 13.0 - 17.0 g/dL 84.2  84.7  84.7   Hematocrit 39.0 - 52.0 % 45.9  45.5  45.5   Platelets 150 - 400 K/uL 169  198  191     .    Latest Ref Rng & Units 09/20/2024   11:10 AM 09/06/2024    12:25 PM 08/23/2024   10:45 AM  CMP  Glucose 70 - 99 mg/dL 778  823  886   BUN 6 - 20 mg/dL 11  13  18    Creatinine 0.61 - 1.24 mg/dL 9.25  9.20  9.29   Sodium 135 - 145 mmol/L 138  138  137   Potassium 3.5 - 5.1 mmol/L 3.5  3.6  3.8   Chloride 98 - 111 mmol/L 106  106  106  CO2 22 - 32 mmol/L 26  26  23    Calcium  8.9 - 10.3 mg/dL 8.6  8.6  8.6   Total Protein 6.5 - 8.1 g/dL 6.3  6.1  6.2   Total Bilirubin 0.0 - 1.2 mg/dL 0.7  0.6  0.6   Alkaline Phos 38 - 126 U/L 50  53  57   AST 15 - 41 U/L 14  13  15    ALT 0 - 44 U/L 18  15  20     . Lab Results  Component Value Date   LDH 114 10/10/2022    RADIOGRAPHIC STUDIES: I have personally reviewed the radiological images as listed and agreed with the findings in the report. No results found.  ASSESSMENT & PLAN:   1) R-ISS stage III high risk IgG Lambda Multiple myeloma not on treatment for more than 1 year due to patient's lack of follow-up. -Patient diagnosed December 2021 and received 1 cycle of CyBorD. -Had previously presented with anemia renal insufficiency and hyperviscosity symptoms. -Bone marrow Bx- 90% plasma cells -Cytogenetics showed: Del(1p): Not Detected  Dup(1q): Not Detected  Gains(15): DETECTED  Gains(5 and 9): Not Detected  Del(13q)/-13: DETECTED  Del(17p)(TP53): Not Detected  IGH(Rearrangement): SEE BELOW   IgH complex: t(4;14): DETECTED  t(11;14): Not Detected  t(14;16): Not Detected  t(14;20): Not Detected   Cytogenetics: Normal male karyotype.   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-status post plasmapheresis x3 session with resolution PLAN - Patient's lab results from today 09/20/2024 were discussed with him in details CBC is within normal limits CMP is stable Myeloma panel shows stable M spike of 0.3 g/dL of IgG lambda monoclonal protein and some IgG kappa monoclonal protein (from the daratumumab ) He notes no infection issues and no new focal symptoms  suggestive of disease progression He will continue his daratumumab  Faspro every 2 weeks and Revlimid  25 mg 3 weeks on 1 week off No notable treatment toxicities at this time No lab or clinical evidence of significant disease progression at this time..  Follow-up -RTC in 4 weeks for next toxicity check - Continue treatment as per integrated scheduling  The total time spent in the appointment was 30 minutes*.  All of the patient's questions were answered with apparent satisfaction. The patient knows to call the clinic with any problems, questions or concerns.   Emaline Saran MD MS AAHIVMS [facility] Hematology/Oncology Physician [facility]  .*Total Encounter Time as defined by the Centers for Medicare and Medicaid Services includes, in addition to the face-to-face time of a patient visit (documented in the note above) non-face-to-face time: obtaining and reviewing outside history, ordering and reviewing medications, tests or procedures, care coordination (communications with other health care professionals or caregivers) and documentation in the medical record.

## 2024-10-04 ENCOUNTER — Inpatient Hospital Stay: Attending: Hematology

## 2024-10-04 ENCOUNTER — Inpatient Hospital Stay

## 2024-10-04 VITALS — BP 131/79 | HR 62 | Temp 97.9°F | Resp 18 | Wt 171.8 lb

## 2024-10-04 DIAGNOSIS — E8809 Other disorders of plasma-protein metabolism, not elsewhere classified: Secondary | ICD-10-CM | POA: Diagnosis not present

## 2024-10-04 DIAGNOSIS — C9000 Multiple myeloma not having achieved remission: Secondary | ICD-10-CM | POA: Diagnosis not present

## 2024-10-04 DIAGNOSIS — Z7985 Long-term (current) use of injectable non-insulin antidiabetic drugs: Secondary | ICD-10-CM | POA: Diagnosis not present

## 2024-10-04 DIAGNOSIS — Z5112 Encounter for antineoplastic immunotherapy: Secondary | ICD-10-CM | POA: Insufficient documentation

## 2024-10-04 DIAGNOSIS — Z7984 Long term (current) use of oral hypoglycemic drugs: Secondary | ICD-10-CM | POA: Insufficient documentation

## 2024-10-04 DIAGNOSIS — E1129 Type 2 diabetes mellitus with other diabetic kidney complication: Secondary | ICD-10-CM | POA: Insufficient documentation

## 2024-10-04 DIAGNOSIS — Z7961 Long term (current) use of immunomodulator: Secondary | ICD-10-CM | POA: Insufficient documentation

## 2024-10-04 DIAGNOSIS — Z7189 Other specified counseling: Secondary | ICD-10-CM

## 2024-10-04 DIAGNOSIS — C9001 Multiple myeloma in remission: Secondary | ICD-10-CM

## 2024-10-04 DIAGNOSIS — E876 Hypokalemia: Secondary | ICD-10-CM | POA: Insufficient documentation

## 2024-10-04 DIAGNOSIS — H538 Other visual disturbances: Secondary | ICD-10-CM | POA: Diagnosis not present

## 2024-10-04 DIAGNOSIS — K0850 Unsatisfactory restoration of tooth, unspecified: Secondary | ICD-10-CM | POA: Insufficient documentation

## 2024-10-04 DIAGNOSIS — Z8673 Personal history of transient ischemic attack (TIA), and cerebral infarction without residual deficits: Secondary | ICD-10-CM | POA: Insufficient documentation

## 2024-10-04 DIAGNOSIS — R808 Other proteinuria: Secondary | ICD-10-CM | POA: Insufficient documentation

## 2024-10-04 LAB — COMPREHENSIVE METABOLIC PANEL WITH GFR
ALT: 18 U/L (ref 0–44)
AST: 11 U/L — ABNORMAL LOW (ref 15–41)
Albumin: 3.8 g/dL (ref 3.5–5.0)
Alkaline Phosphatase: 51 U/L (ref 38–126)
Anion gap: 4 — ABNORMAL LOW (ref 5–15)
BUN: 12 mg/dL (ref 6–20)
CO2: 26 mmol/L (ref 22–32)
Calcium: 8.7 mg/dL — ABNORMAL LOW (ref 8.9–10.3)
Chloride: 109 mmol/L (ref 98–111)
Creatinine, Ser: 0.61 mg/dL (ref 0.61–1.24)
GFR, Estimated: >60 mL/min (ref >60)
Glucose, Bld: 151 mg/dL — ABNORMAL HIGH (ref 70–99)
Potassium: 3.9 mmol/L (ref 3.5–5.1)
Sodium: 139 mmol/L (ref 135–145)
Total Bilirubin: 0.6 mg/dL (ref 0.0–1.2)
Total Protein: 6.2 g/dL — ABNORMAL LOW (ref 6.5–8.1)

## 2024-10-04 LAB — CBC WITH DIFFERENTIAL (CANCER CENTER ONLY)
Abs Immature Granulocytes: 0.02 K/uL (ref 0.00–0.07)
Basophils Absolute: 0 K/uL (ref 0.0–0.1)
Basophils Relative: 1 %
Eosinophils Absolute: 0.2 K/uL (ref 0.0–0.5)
Eosinophils Relative: 3 %
HCT: 44.8 % (ref 39.0–52.0)
Hemoglobin: 15.4 g/dL (ref 13.0–17.0)
Immature Granulocytes: 0 %
Lymphocytes Relative: 38 %
Lymphs Abs: 3.3 K/uL (ref 0.7–4.0)
MCH: 27.4 pg (ref 26.0–34.0)
MCHC: 34.4 g/dL (ref 30.0–36.0)
MCV: 79.7 fL — ABNORMAL LOW (ref 80.0–100.0)
Monocytes Absolute: 1 K/uL (ref 0.1–1.0)
Monocytes Relative: 11 %
Neutro Abs: 4.2 K/uL (ref 1.7–7.7)
Neutrophils Relative %: 47 %
Platelet Count: 161 K/uL (ref 150–400)
RBC: 5.62 MIL/uL (ref 4.22–5.81)
RDW: 14.9 % (ref 11.5–15.5)
WBC Count: 8.7 K/uL (ref 4.0–10.5)
nRBC: 0 % (ref 0.0–0.2)

## 2024-10-04 MED ORDER — DIPHENHYDRAMINE HCL 25 MG PO CAPS
50.0000 mg | ORAL_CAPSULE | Freq: Once | ORAL | Status: AC
  Administered 2024-10-04: 50 mg via ORAL
  Filled 2024-10-04: qty 2

## 2024-10-04 MED ORDER — MONTELUKAST SODIUM 10 MG PO TABS
10.0000 mg | ORAL_TABLET | Freq: Once | ORAL | Status: AC
  Administered 2024-10-04: 10 mg via ORAL
  Filled 2024-10-04: qty 1

## 2024-10-04 MED ORDER — ACETAMINOPHEN 325 MG PO TABS
650.0000 mg | ORAL_TABLET | Freq: Once | ORAL | Status: AC
  Administered 2024-10-04: 650 mg via ORAL
  Filled 2024-10-04: qty 2

## 2024-10-04 MED ORDER — DEXAMETHASONE 4 MG PO TABS
2.0000 mg | ORAL_TABLET | Freq: Once | ORAL | Status: AC
  Administered 2024-10-04: 2 mg via ORAL
  Filled 2024-10-04: qty 1

## 2024-10-04 MED ORDER — DARATUMUMAB-HYALURONIDASE-FIHJ 1800-30000 MG-UT/15ML ~~LOC~~ SOLN
1800.0000 mg | Freq: Once | SUBCUTANEOUS | Status: AC
  Administered 2024-10-04: 1800 mg via SUBCUTANEOUS
  Filled 2024-10-04: qty 15

## 2024-10-04 MED ORDER — FAMOTIDINE 20 MG PO TABS
20.0000 mg | ORAL_TABLET | Freq: Once | ORAL | Status: AC
  Administered 2024-10-04: 20 mg via ORAL
  Filled 2024-10-04: qty 1

## 2024-10-04 NOTE — Patient Instructions (Signed)

## 2024-10-04 NOTE — Progress Notes (Signed)
 Pt given Revlimid  on 10/04/24. It is delivered to the [facility]. Pt to start taking this cycle on 10/05/24 - 10/25/24. Pt will take off 10/26/24 - 11/4 /25. New cycle will start on 11/02/24. Pt aware when to start this medication.

## 2024-10-06 ENCOUNTER — Other Ambulatory Visit

## 2024-10-06 ENCOUNTER — Encounter: Payer: Self-pay | Admitting: Internal Medicine

## 2024-10-06 ENCOUNTER — Ambulatory Visit (INDEPENDENT_AMBULATORY_CARE_PROVIDER_SITE_OTHER): Admitting: Internal Medicine

## 2024-10-06 VITALS — BP 120/76 | HR 72 | Temp 98.6°F | Ht 66.0 in | Wt 168.8 lb

## 2024-10-06 DIAGNOSIS — E1129 Type 2 diabetes mellitus with other diabetic kidney complication: Secondary | ICD-10-CM

## 2024-10-06 DIAGNOSIS — E1165 Type 2 diabetes mellitus with hyperglycemia: Secondary | ICD-10-CM

## 2024-10-06 DIAGNOSIS — E876 Hypokalemia: Secondary | ICD-10-CM

## 2024-10-06 DIAGNOSIS — I10 Essential (primary) hypertension: Secondary | ICD-10-CM | POA: Diagnosis not present

## 2024-10-06 DIAGNOSIS — E559 Vitamin D deficiency, unspecified: Secondary | ICD-10-CM | POA: Diagnosis not present

## 2024-10-06 DIAGNOSIS — Z758 Other problems related to medical facilities and other health care: Secondary | ICD-10-CM

## 2024-10-06 DIAGNOSIS — Z794 Long term (current) use of insulin: Secondary | ICD-10-CM | POA: Diagnosis not present

## 2024-10-06 DIAGNOSIS — R809 Proteinuria, unspecified: Secondary | ICD-10-CM

## 2024-10-06 DIAGNOSIS — Z603 Acculturation difficulty: Secondary | ICD-10-CM

## 2024-10-06 LAB — LIPID PANEL
Cholesterol: 209 mg/dL — ABNORMAL HIGH (ref 0–200)
HDL: 49.1 mg/dL (ref >39.00)
LDL Cholesterol: 141 mg/dL — ABNORMAL HIGH (ref 0–99)
NonHDL: 160.12
Total CHOL/HDL Ratio: 4
Triglycerides: 94 mg/dL (ref 0.0–149.0)
VLDL: 18.8 mg/dL (ref 0.0–40.0)

## 2024-10-06 LAB — COMPREHENSIVE METABOLIC PANEL WITH GFR
ALT: 17 U/L (ref 0–53)
AST: 14 U/L (ref 0–37)
Albumin: 4.2 g/dL (ref 3.5–5.2)
Alkaline Phosphatase: 50 U/L (ref 39–117)
BUN: 18 mg/dL (ref 6–23)
CO2: 26 meq/L (ref 19–32)
Calcium: 8.9 mg/dL (ref 8.4–10.5)
Chloride: 103 meq/L (ref 96–112)
Creatinine, Ser: 0.73 mg/dL (ref 0.40–1.50)
GFR: 100.81 mL/min (ref >60.00)
Glucose, Bld: 132 mg/dL — ABNORMAL HIGH (ref 70–99)
Potassium: 3.6 meq/L (ref 3.5–5.1)
Sodium: 138 meq/L (ref 135–145)
Total Bilirubin: 0.8 mg/dL (ref 0.2–1.2)
Total Protein: 6.5 g/dL (ref 6.0–8.3)

## 2024-10-06 LAB — CBC WITH DIFFERENTIAL/PLATELET
Basophils Absolute: 0 K/uL (ref 0.0–0.1)
Basophils Relative: 0.3 % (ref 0.0–3.0)
Eosinophils Absolute: 0.1 K/uL (ref 0.0–0.7)
Eosinophils Relative: 1.6 % (ref 0.0–5.0)
HCT: 48.8 % (ref 39.0–52.0)
Hemoglobin: 15.7 g/dL (ref 13.0–17.0)
Lymphocytes Relative: 36.7 % (ref 12.0–46.0)
Lymphs Abs: 2.8 K/uL (ref 0.7–4.0)
MCHC: 32.1 g/dL (ref 30.0–36.0)
MCV: 83.3 fl (ref 78.0–100.0)
Monocytes Absolute: 0.8 K/uL (ref 0.1–1.0)
Monocytes Relative: 9.9 % (ref 3.0–12.0)
Neutro Abs: 4 K/uL (ref 1.4–7.7)
Neutrophils Relative %: 51.5 % (ref 43.0–77.0)
Platelets: 174 K/uL (ref 150.0–400.0)
RBC: 5.86 Mil/uL — ABNORMAL HIGH (ref 4.22–5.81)
RDW: 15.4 % (ref 11.5–15.5)
WBC: 7.7 K/uL (ref 4.0–10.5)

## 2024-10-06 LAB — MICROALBUMIN / CREATININE URINE RATIO
Creatinine,U: 101.1 mg/dL
Microalb Creat Ratio: 12.5 mg/g (ref 0.0–30.0)
Microalb, Ur: 1.3 mg/dL (ref 0.0–1.9)

## 2024-10-06 LAB — VITAMIN D 25 HYDROXY (VIT D DEFICIENCY, FRACTURES): VITD: 14.91 ng/mL — ABNORMAL LOW (ref 30.00–100.00)

## 2024-10-06 NOTE — Patient Instructions (Addendum)
 It was a pleasure seeing you today! Your health and satisfaction are our top priorities.  Bernardino Cone, MD  VISIT SUMMARY: Today, we reviewed your blood sugar management and overall health. Your blood sugar levels have improved, but there are still spikes, especially after eating rice. We discussed your current medications and dietary habits, and made some recommendations to help manage your diabetes better. We also reviewed your potassium levels and multiple myeloma status.  YOUR PLAN: -TYPE 2 DIABETES MELLITUS WITH HYPERGLYCEMIA: Type 2 diabetes is a condition where your body does not use insulin  properly, leading to high blood sugar levels. Your blood sugar levels have improved but still spike after eating rice. Continue taking Farxiga , Metformin , and Mounjaro . We recommend replacing white rice with cauliflower rice, parboiled rice, or zoodles to help manage your blood sugar. We will perform blood work today to check your average blood glucose, vitamin D , and aldosterone levels, and a urine test to check for kidney damage. Please continue using your continuous glucose meter and follow up in 3 months to reassess your blood sugar control and dietary adherence.  -HYPOKALEMIA: Hypokalemia is a condition where you have low potassium levels in your blood. Your potassium levels have improved with supplementation. Continue taking your potassium supplements. We will check your aldosterone levels in today's blood work.  -MULTIPLE MYELOMA IN REMISSION: Multiple myeloma is a type of blood cancer. Your multiple myeloma remains in remission with no current issues reported.  INSTRUCTIONS: Please follow up in 3 months to reassess your blood sugar control and dietary adherence. Continue taking your current medications and supplements as discussed. Perform blood work today to check your average blood glucose, vitamin D , and aldosterone levels, and a urine test to check for kidney damage.  Your Providers PCP:  Cone Bernardino MATSU, MD,  [phone]) Referring Provider: Cone Bernardino MATSU, MD,  [phone]) Care Team Provider: [name], MD,  [phone])  NEXT STEPS: [x]  Early Intervention: Schedule sooner appointment, call our on-call services, or go to emergency room if there is any significant Increase in pain or discomfort New or worsening symptoms Sudden or severe changes in your health [x]  Flexible Follow-Up: We recommend a Return in about 3 months (around 01/06/2025) for chronic disease monitoring and management. for optimal routine care. This allows for progress monitoring and treatment adjustments. [x]  Preventive Care: Schedule your annual preventive care visit! It's typically covered by insurance and helps identify potential health issues early. [x]  Lab & X-ray Appointments: Incomplete tests scheduled today, or call to schedule. X-rays: [facility] (M-F, 8:30am-noon or 1pm-5pm). [x]  Medical Information Release: Sign a release form at front desk to obtain relevant medical information we don't have.  MAKING THE MOST OF OUR FOCUSED 20 MINUTE APPOINTMENTS: [x]   Clearly state your top concerns at the beginning of the visit to focus our discussion [x]   If you anticipate you will need more time, please inform the front desk during scheduling - we can book multiple appointments in the same week. [x]   If you have transportation problems- use our convenient video appointments or ask about transportation support. [x]   We can get down to business faster if you use MyChart to update information before the visit and submit non-urgent questions before your visit. Thank you for taking the time to provide details through MyChart.  Let our nurse know and she can import this information into your encounter documents.  Arrival and Wait Times: [x]   Arriving on time ensures that everyone receives prompt attention. [x]   Early morning (8a) and afternoon (1p) appointments tend to have  shortest wait times. [x]   Unfortunately, we cannot delay appointments for late arrivals or hold slots during phone calls.  Getting Answers and Following Up [x]   Simple Questions & Concerns: For quick questions or basic follow-up after your visit, reach us  at [phone] or MyChart messaging. [x]   Complex Concerns: If your concern is more complex, scheduling an appointment might be best. Discuss this with the staff to find the most suitable option. [x]   Lab & Imaging Results: We'll contact you directly if results are abnormal or you don't use MyChart. Most normal results will be on MyChart within 2-3 business days, with a review message from Dr. Jesus. Haven't heard back in 2 weeks? Need results sooner? Contact us  at [phone]. [x]   Referrals: Our referral coordinator will manage specialist referrals. The specialist's office should contact you within 2 weeks to schedule an appointment. Call us  if you haven't heard from them after 2 weeks.  Staying Connected [x]   MyChart: Activate your MyChart for the fastest way to access results and message us . See the last page of this paperwork for instructions on how to activate.  Bring to Your Next Appointment [x]   Medications: Please bring all your medication bottles to your next appointment to ensure we have an accurate record of your prescriptions. [x]   Health Diaries: If you're monitoring any health conditions at home, keeping a diary of your readings can be very helpful for discussions at your next appointment.  Billing [x]   X-ray & Lab Orders: These are billed by separate companies. Contact the invoicing company directly for questions or concerns. [x]   Visit Charges: Discuss any billing inquiries with our administrative services team.  Your Satisfaction Matters [x]   Share Your Experience: We strive for your satisfaction! If you have any complaints, or preferably compliments, please let Dr. Jesus know directly or contact our Practice  Administrators, [name] or [employer], by asking at the front desk.   Reviewing Your Records [x]   Review this early draft of your clinical encounter notes below and the final encounter summary tomorrow on MyChart after its been completed.  All orders placed so far are visible here: Diabetes mellitus with proteinuria (HCC)  Primary hypertension -     Aldosterone + renin activity w/ ratio  Type 2 diabetes mellitus with hyperglycemia, with long-term current use of insulin  (HCC) -     CBC with Differential/Platelet -     Comprehensive metabolic panel with GFR -     Lipid panel -     Hemoglobin A1c -     Microalbumin / creatinine urine ratio  Vitamin D  deficiency -     VITAMIN D  25 Hydroxy (Vit-D Deficiency, Fractures)  Hypokalemia -     Aldosterone + renin activity w/ ratio         Good substitutes for rice in the diet of an Asian patient with diabetes include wholegrain or semolina pasta, highland barley-multigrain rice, parboiled rice, and legumes such as lentils, black beans, and chickpeas. These options have a lower glycemic index and can help reduce postprandial glucose spikes compared to white rice.[1-5] Combining rice with pulses (lentils, beans, chickpeas) or replacing part of the rice with these foods can significantly lower the glycemic response. Studies show that substituting half the carbohydrate from rice with lentils, or adding black beans or chickpeas to rice, attenuates postprandial blood glucose levels.[3-4] Whole grains and multigrain blends--such as highland barley, brown rice, oats, corn grit,  and buckwheat--are preferable to refined white rice for glycemic control. Highland barley-multigrain rice, for example, has a much lower glycemic index than white rice, though it may be less preferred in taste and texture.[2] Parboiled rice also produces a lower glycemic response than both white and brown rice in diabetic patients.[5]  It was a pleasure seeing you today!  Your health and satisfaction are our top priorities.  Bernardino Cone, MD     Your Providers PCP: Cone Bernardino MATSU, MD,  [phone]) Referring Provider: Cone Bernardino MATSU, MD,  [phone]) Care Team Provider: [name], MD,  [phone])  NEXT STEPS: [x]  Early Intervention: Schedule sooner appointment, call our on-call services, or go to emergency room if there is any significant Increase in pain or discomfort New or worsening symptoms Sudden or severe changes in your health [x]  Flexible Follow-Up: We recommend a Return in about 3 months (around 01/06/2025). for optimal routine care. This allows for progress monitoring and treatment adjustments. [x]  Preventive Care: Schedule your annual preventive care visit! It's typically covered by insurance and helps identify potential health issues early. [x]  Lab & X-ray Appointments: Incomplete tests scheduled today, or call to schedule. X-rays: [facility] (M-F, 8:30am-noon or 1pm-5pm). [x]  Medical Information Release: Sign a release form at front desk to obtain relevant medical information we don't have.  MAKING THE MOST OF OUR FOCUSED 20 MINUTE APPOINTMENTS: [x]   Clearly state your top concerns at the beginning of the visit to focus our discussion [x]   If you anticipate you will need more time, please inform the front desk during scheduling - we can book multiple appointments in the same week. [x]   If you have transportation problems- use our convenient video appointments or ask about transportation support. [x]   We can get down to business faster if you use MyChart to update information before the visit and submit non-urgent questions before your visit. Thank you for taking the time to provide details through MyChart.  Let our nurse know and she can import this information into your encounter documents.  Arrival and Wait Times: [x]   Arriving on time ensures that everyone receives prompt attention. [x]   Early morning  (8a) and afternoon (1p) appointments tend to have shortest wait times. [x]   Unfortunately, we cannot delay appointments for late arrivals or hold slots during phone calls.  Getting Answers and Following Up [x]   Simple Questions & Concerns: For quick questions or basic follow-up after your visit, reach us  at [phone] or MyChart messaging. [x]   Complex Concerns: If your concern is more complex, scheduling an appointment might be best. Discuss this with the staff to find the most suitable option. [x]   Lab & Imaging Results: We'll contact you directly if results are abnormal or you don't use MyChart. Most normal results will be on MyChart within 2-3 business days, with a review message from Dr. Cone. Haven't heard back in 2 weeks? Need results sooner? Contact us  at [phone]. [x]   Referrals: Our referral coordinator will manage specialist referrals. The specialist's office should contact you within 2 weeks to schedule an appointment. Call us  if you haven't heard from them after 2 weeks.  Staying Connected [x]   MyChart: Activate your MyChart for the fastest way to access results and message us . See the last page of this paperwork for instructions on how to activate.  Bring to Your Next Appointment [x]   Medications: Please bring all your medication bottles to your next appointment to ensure we have an accurate record of  your prescriptions. [x]   Health Diaries: If you're monitoring any health conditions at home, keeping a diary of your readings can be very helpful for discussions at your next appointment.  Billing [x]   X-ray & Lab Orders: These are billed by separate companies. Contact the invoicing company directly for questions or concerns. [x]   Visit Charges: Discuss any billing inquiries with our administrative services team.  Your Satisfaction Matters [x]   Share Your Experience: We strive for your satisfaction! If you have any complaints, or preferably compliments, please let Dr.  Jesus know directly or contact our Practice Administrators, [name] or [employer], by asking at the front desk.   Reviewing Your Records [x]   Review this early draft of your clinical encounter notes below and the final encounter summary tomorrow on MyChart after its been completed.  All orders placed so far are visible here: Diabetes mellitus with proteinuria (HCC)  Primary hypertension  Type 2 diabetes mellitus with hyperglycemia, with long-term current use of insulin  (HCC)  Vitamin D  deficiency  Hypokalemia        Individualized meal planning is essential, ideally with a registered dietitian, to ensure cultural relevance and dietary adherence. The American Diabetes Association and American Heart Association recommend focusing on increasing fiber, whole grains, legumes, and minimizing refined carbohydrates, while considering patient preferences and cultural dietary patterns.[6-7] Long-term acceptability and palatability of some substitutes may be limited in Asian populations, and further research on patient preferences is needed. Taste and cultural habits should be considered when recommending dietary changes.[2][7] Would you like me to summarize the evidence comparing the glycemic impact and long-term outcomes of these rice substitutes specifically in Asian diabetic populations, including any randomized controlled trials or cohort studies?

## 2024-10-06 NOTE — Progress Notes (Signed)
 ==============================  [facility]: [phone]   -- Medical Office Visit --  Patient: Donald Suarez      Age: 58 y.o.       Sex:  male  Date:   10/06/2024 Today's Healthcare Provider: Bernardino KANDICE Cone, MD  ==============================   Chief Complaint: Diabetes  Discussed the use of AI scribe software for clinical note transcription with the patient, who gave verbal consent to proceed.  History of Present Illness  58 year old male with diabetes who presents for follow-up of blood sugar management.  His blood sugar levels have improved recently. He uses a continuous glucose meter, which showed a spike to 250 mg/dL after consuming rice at dinner. He acknowledges that rice causes significant increases in his blood sugar. He is currently taking Farxiga , metformin , and Mounjaro  for diabetes management.  He has tried brown rice, which causes a slight increase in blood sugar, but less than white rice. He has not tried chickpeas or zoodles yet.  He is also taking fenobranone (Kerendia ) 10 mg, which he started on May 28th, and reports regular use. He has not started taking vitamin D  supplements yet, although they were recommended by his blood doctor. He is taking potassium supplements, which have improved his levels over the past few months.  He is involved in landscaping work. No current pain or other health issues.  Lab Results  Component Value Date   HGBA1C 7.1 (H) 06/24/2024   HGBA1C 7.0 (A) 03/21/2024   HGBA1C 8.4 (H) 12/09/2023    Lab Results  Component Value Date   MICROALBUR 1.3 10/06/2024   MICROALBUR <0.7 06/24/2024   MICROALBUR 2.7 (H) 05/24/2024   Lab Results  Component Value Date   GFR 100.81 10/06/2024   GFR 100.57 12/09/2023   GFR 102.43 09/09/2023   GFR 103.96 06/09/2023   GFR 108.05 08/12/2022   Lab Results  Component Value Date   K 3.6 10/06/2024   K 3.9 10/04/2024   K 3.5 09/20/2024   K 3.6 09/06/2024   K 3.8  08/23/2024   K 3.9 08/09/2024   K 3.7 07/19/2024   K 3.4 (L) 07/05/2024   K 3.1 (L) 06/10/2024   K 3.3 (L) 06/03/2024   K 3.2 (L) 05/13/2024   K 3.0 (L) 04/29/2024   K 3.3 (L) 04/15/2024   K 3.4 (L) 03/25/2024   K 3.2 (L) 03/11/2024   K 3.4 (L) 02/26/2024   K 2.9 (L) 02/12/2024   K 3.4 (L) 01/15/2024   K 3.4 (L) 12/18/2023   K 3.3 (L) 12/09/2023   K 3.7 11/20/2023   K 3.4 (L) 10/23/2023   K 3.5 09/25/2023   K 4.1 09/09/2023   K 3.5 08/28/2023   K 3.5 07/31/2023   K 3.4 (L) 07/03/2023   K 3.8 06/09/2023   K 3.4 (L) 06/05/2023   K 4.1 05/08/2023   K 3.5 04/24/2023   K 3.5 04/10/2023   K 4.0 03/27/2023   K 3.5 03/13/2023   K 3.2 (L) 02/27/2023   K 3.4 (L) 02/13/2023   K 3.3 (L) 01/30/2023   K 3.3 (L) 01/16/2023   K 3.4 (L) 01/02/2023   K 3.6 12/19/2022   K 3.6 12/05/2022   K 3.6 11/21/2022   K 3.4 (L) 11/07/2022   K 3.7 10/10/2022   K 3.2 (L) 09/11/2022   K 4.0 08/15/2022   K 3.4 (L) 08/12/2022   K 3.7 08/08/2022   K 3.5 08/01/2022  K 3.8 07/25/2022    Background Reviewed: Problem List: has Multiple myeloma in remission (HCC); Counseling regarding advance care planning and goals of care; Hyperviscosity; Type 2 diabetes mellitus with hyperglycemia (HCC); Chronic apical periodontitis; Hypocalcemia; Language barrier affecting health care; History of dental problems; Diabetes mellitus with proteinuria (HCC); Hypertension; Chronic kidney disease; Microcytosis; Hypogammaglobulinemia; and Vitamin D  deficiency on their problem list. Past Medical History:  has a past medical history of Abfraction (08/06/2022), Accretions on teeth (08/06/2022), Anemia, Attrition, teeth excessive (08/06/2022), Blurry vision (12/20/2020), Caries (08/06/2022), Defective dental restoration (08/06/2022), Elevated ferritin (08/07/2022), Hyperproteinemia (12/20/2020), Hypoalbuminemia (05/13/2022), Hypocalcemia (08/19/2022), Hypokalemia (03/09/2023), Hypomagnesemia (12/20/2020), Loose, teeth  (08/06/2022), Malocclusion (08/06/2022), Microalbuminuria (08/19/2022), Periodontal disease (08/06/2022), Peripheral neuropathy (09/08/2022), and Teeth missing (08/06/2022). Past Surgical History:   has a past surgical history that includes Appendectomy; [facility] US  Guide Vasc Access Right (05/15/2022); Eoin Fluoro Guide CV Line Right (05/15/2022); [place] PATIENT EVAL TECH 0-60 MINS (05/19/2022); and [facility] BONE MARROW BIOPSY & ASPIRATION (02/11/2023). Social History:   reports that he has never smoked. He has never used smokeless tobacco. He reports that he does not drink alcohol and does not use drugs. Family History:  family history is not on file. Allergies:  has no known allergies.   Medication Reconciliation: Current Outpatient Medications on File Prior to Visit  Medication Sig   Accu-Chek Softclix Lancets lancets Use to test blood sugars up to 4 times daily as directed.   acyclovir  (ZOVIRAX ) 400 MG tablet Take 1 tablet (400 mg total) by mouth 2 (two) times daily.   aspirin  EC 81 MG tablet Take 1 tablet (81 mg total) by mouth daily. Swallow whole.   Blood Glucose Monitoring Suppl (ACCU-CHEK GUIDE) w/Device KIT Use to test blood sugars up to 4 times daily as directed.   Cholecalciferol (VITAMIN D3) 125 MCG (5000 UT) CAPS Take 1 capsule (5,000 Units total) by mouth daily.   ciclopirox  (LOPROX ) 0.77 % cream Apply topically 2 (two) times daily.   Continuous Glucose Sensor (FREESTYLE LIBRE 3 PLUS SENSOR) MISC Change sensor every 15 days.   Cyanocobalamin  (B-12) 1000 MCG CAPS Take 1 tablet by mouth daily.   dapagliflozin  propanediol (FARXIGA ) 10 MG TABS tablet Take 1 tablet (10 mg total) by mouth daily before breakfast. sa asar sa hroi   ergocalciferol  (VITAMIN D2) 1.25 MG (50000 UT) capsule Take 1 capsule (50,000 Units total) by mouth once a week.   Finerenone  (KERENDIA ) 10 MG TABS Take 1 tablet (10 mg total) by mouth daily at 12 noon.   glucose blood (ACCU-CHEK GUIDE) test strip Use to test blood sugars up to 4  times daily as directed.   lenalidomide  (REVLIMID ) 25 MG capsule TAKE 1 CAPSULE BY MOUTH 1 TIME A DAY FOR 21 DAYS ON THEN 7 DAYS OFF.   losartan  (COZAAR ) 25 MG tablet Take 1 tablet (25 mg total) by mouth daily. Start taking if blood pressure is over 130/80.  Protects kidneys and lowers blood pressure.   Menaquinone-7 (VITAMIN K2 ) 100 MCG CAPS Take 1 capsule by mouth daily at 6 (six) AM.   metFORMIN  (GLUCOPHAGE ) 500 MG tablet Take 2 tablets (1,000 mg total) by mouth 2 (two) times daily with a meal. 2 asar 1 wot, 2 wot mnhum  amang 1 hroi   potassium chloride  SA (KLOR-CON  M) 20 MEQ tablet Take 1 tablet (20 mEq total) by mouth daily.   rosuvastatin  (CRESTOR ) 10 MG tablet Take 1 tablet (10 mg total) by mouth daily.   tirzepatide  (MOUNJARO ) 15 MG/0.5ML Pen Inject 15  mg into the skin once a week. Start after 4 weeks on 12.5 mg dose.   Current Facility-Administered Medications on File Prior to Visit  Medication   clotrimazole  (LOTRIMIN ) 1 % cream  There are no discontinued medications.   Physical Exam:    10/06/2024    9:34 AM 10/04/2024    2:34 PM 10/04/2024   12:20 PM  Vitals with BMI  Height 5' 6    Weight 168 lbs 13 oz  171 lbs 13 oz  BMI 27.26  27.74  Systolic 120 131 866  Diastolic 76 79 74  Pulse 72 62 68  Vital signs reviewed.  Nursing notes reviewed. Weight trend reviewed. Physical Activity: Not on file   General Appearance:  No acute distress appreciable.   Well-groomed, healthy-appearing male.  Well proportioned with no abnormal fat distribution.  Good muscle tone. Pulmonary:  Normal work of breathing at rest, no respiratory distress apparent. SpO2: 98 %  Musculoskeletal: All extremities are intact.  Neurological:  Awake, alert, oriented, and engaged.  No obvious focal neurological deficits or cognitive impairments.  Sensorium seems unclouded.   Speech is clear and coherent with logical content. Psychiatric:  Appropriate mood, pleasant and cooperative demeanor, thoughtful and  engaged during the exam  Interpreter present  Verbalized to patient: Results LABS Continuous glucose monitor reading: 250 mg/dL (89/91/7974)     89/01/7973   12:24 PM 09/20/2024    1:02 PM 09/06/2024   12:47 PM 08/23/2024   11:29 AM  PHQ 2/9 Scores  PHQ - 2 Score 0 0 0 0    {   Results for orders placed or performed in visit on 10/06/24  CBC with Differential/Platelet  Result Value Ref Range   WBC 7.7 4.0 - 10.5 K/uL   RBC 5.86 (H) 4.22 - 5.81 Mil/uL   Hemoglobin 15.7 13.0 - 17.0 g/dL   HCT 51.1 60.9 - 47.9 %   MCV 83.3 78.0 - 100.0 fl   MCHC 32.1 30.0 - 36.0 g/dL   RDW 84.5 88.4 - 84.4 %   Platelets 174.0 150.0 - 400.0 K/uL   Neutrophils Relative % 51.5 43.0 - 77.0 %   Lymphocytes Relative 36.7 12.0 - 46.0 %   Monocytes Relative 9.9 3.0 - 12.0 %   Eosinophils Relative 1.6 0.0 - 5.0 %   Basophils Relative 0.3 0.0 - 3.0 %   Neutro Abs 4.0 1.4 - 7.7 K/uL   Lymphs Abs 2.8 0.7 - 4.0 K/uL   Monocytes Absolute 0.8 0.1 - 1.0 K/uL   Eosinophils Absolute 0.1 0.0 - 0.7 K/uL   Basophils Absolute 0.0 0.0 - 0.1 K/uL  Comprehensive metabolic panel with GFR  Result Value Ref Range   Sodium 138 135 - 145 mEq/L   Potassium 3.6 3.5 - 5.1 mEq/L   Chloride 103 96 - 112 mEq/L   CO2 26 19 - 32 mEq/L   Glucose, Bld 132 (H) 70 - 99 mg/dL   BUN 18 6 - 23 mg/dL   Creatinine, Ser 9.26 0.40 - 1.50 mg/dL   Total Bilirubin 0.8 0.2 - 1.2 mg/dL   Alkaline Phosphatase 50 39 - 117 U/L   AST 14 0 - 37 U/L   ALT 17 0 - 53 U/L   Total Protein 6.5 6.0 - 8.3 g/dL   Albumin  4.2 3.5 - 5.2 g/dL   GFR 899.18 >39.99 mL/min   Calcium  8.9 8.4 - 10.5 mg/dL  Lipid panel  Result Value Ref Range   Cholesterol 209 (H) 0 - 200  mg/dL   Triglycerides 05.9 0.0 - 149.0 mg/dL   HDL 50.89 >60.99 mg/dL   VLDL 81.1 0.0 - 59.9 mg/dL   LDL Cholesterol 858 (H) 0 - 99 mg/dL   Total CHOL/HDL Ratio 4    NonHDL 160.12   Microalbumin / creatinine urine ratio  Result Value Ref Range   Microalb, Ur 1.3 0.0 - 1.9 mg/dL    Creatinine,U 898.8 mg/dL   Microalb Creat Ratio 12.5 0.0 - 30.0 mg/g  Vitamin D  (25 hydroxy)  Result Value Ref Range   VITD 14.91 (L) 30.00 - 100.00 ng/mL  } Office Visit on 10/06/2024  Component Date Value Ref Range Status   WBC 10/06/2024 7.7  4.0 - 10.5 K/uL Final   RBC 10/06/2024 5.86 (H)  4.22 - 5.81 Mil/uL Final   Hemoglobin 10/06/2024 15.7  13.0 - 17.0 g/dL Final   HCT 89/90/7974 48.8  39.0 - 52.0 % Final   MCV 10/06/2024 83.3  78.0 - 100.0 fl Final   MCHC 10/06/2024 32.1  30.0 - 36.0 g/dL Final   RDW 89/90/7974 15.4  11.5 - 15.5 % Final   Platelets 10/06/2024 174.0  150.0 - 400.0 K/uL Final   Neutrophils Relative % 10/06/2024 51.5  43.0 - 77.0 % Final   Lymphocytes Relative 10/06/2024 36.7  12.0 - 46.0 % Final   Monocytes Relative 10/06/2024 9.9  3.0 - 12.0 % Final   Eosinophils Relative 10/06/2024 1.6  0.0 - 5.0 % Final   Basophils Relative 10/06/2024 0.3  0.0 - 3.0 % Final   Neutro Abs 10/06/2024 4.0  1.4 - 7.7 K/uL Final   Lymphs Abs 10/06/2024 2.8  0.7 - 4.0 K/uL Final   Monocytes Absolute 10/06/2024 0.8  0.1 - 1.0 K/uL Final   Eosinophils Absolute 10/06/2024 0.1  0.0 - 0.7 K/uL Final   Basophils Absolute 10/06/2024 0.0  0.0 - 0.1 K/uL Final   Sodium 10/06/2024 138  135 - 145 mEq/L Final   Potassium 10/06/2024 3.6  3.5 - 5.1 mEq/L Final   Chloride 10/06/2024 103  96 - 112 mEq/L Final   CO2 10/06/2024 26  19 - 32 mEq/L Final   Glucose, Bld 10/06/2024 132 (H)  70 - 99 mg/dL Final   BUN 89/90/7974 18  6 - 23 mg/dL Final   Creatinine, Ser 10/06/2024 0.73  0.40 - 1.50 mg/dL Final   Total Bilirubin 10/06/2024 0.8  0.2 - 1.2 mg/dL Final   Alkaline Phosphatase 10/06/2024 50  39 - 117 U/L Final   AST 10/06/2024 14  0 - 37 U/L Final   ALT 10/06/2024 17  0 - 53 U/L Final   Total Protein 10/06/2024 6.5  6.0 - 8.3 g/dL Final   Albumin  10/06/2024 4.2  3.5 - 5.2 g/dL Final   GFR 89/90/7974 100.81  >60.00 mL/min Final   Calcium  10/06/2024 8.9  8.4 - 10.5 mg/dL Final    Cholesterol 89/90/7974 209 (H)  0 - 200 mg/dL Final   Triglycerides 89/90/7974 94.0  0.0 - 149.0 mg/dL Final   HDL 89/90/7974 49.10  >39.00 mg/dL Final   VLDL 89/90/7974 18.8  0.0 - 40.0 mg/dL Final   LDL Cholesterol 10/06/2024 141 (H)  0 - 99 mg/dL Final   Total CHOL/HDL Ratio 10/06/2024 4   Final   NonHDL 10/06/2024 160.12   Final   Microalb, Ur 10/06/2024 1.3  0.0 - 1.9 mg/dL Final   Creatinine,U 89/90/7974 101.1  mg/dL Final   Microalb Creat Ratio 10/06/2024 12.5  0.0 - 30.0 mg/g Final  VITD 10/06/2024 14.91 (L)  30.00 - 100.00 ng/mL Final  Appointment on 10/04/2024  Component Date Value Ref Range Status   WBC Count 10/04/2024 8.7  4.0 - 10.5 K/uL Final   RBC 10/04/2024 5.62  4.22 - 5.81 MIL/uL Final   Hemoglobin 10/04/2024 15.4  13.0 - 17.0 g/dL Final   HCT 89/92/7974 44.8  39.0 - 52.0 % Final   MCV 10/04/2024 79.7 (L)  80.0 - 100.0 fL Final   MCH 10/04/2024 27.4  26.0 - 34.0 pg Final   MCHC 10/04/2024 34.4  30.0 - 36.0 g/dL Final   RDW 89/92/7974 14.9  11.5 - 15.5 % Final   Platelet Count 10/04/2024 161  150 - 400 K/uL Final   nRBC 10/04/2024 0.0  0.0 - 0.2 % Final   Neutrophils Relative % 10/04/2024 47  % Final   Neutro Abs 10/04/2024 4.2  1.7 - 7.7 K/uL Final   Lymphocytes Relative 10/04/2024 38  % Final   Lymphs Abs 10/04/2024 3.3  0.7 - 4.0 K/uL Final   Monocytes Relative 10/04/2024 11  % Final   Monocytes Absolute 10/04/2024 1.0  0.1 - 1.0 K/uL Final   Eosinophils Relative 10/04/2024 3  % Final   Eosinophils Absolute 10/04/2024 0.2  0.0 - 0.5 K/uL Final   Basophils Relative 10/04/2024 1  % Final   Basophils Absolute 10/04/2024 0.0  0.0 - 0.1 K/uL Final   Immature Granulocytes 10/04/2024 0  % Final   Abs Immature Granulocytes 10/04/2024 0.02  0.00 - 0.07 K/uL Final   Sodium 10/04/2024 139  135 - 145 mmol/L Final   Potassium 10/04/2024 3.9  3.5 - 5.1 mmol/L Final   Chloride 10/04/2024 109  98 - 111 mmol/L Final   CO2 10/04/2024 26  22 - 32 mmol/L Final   Glucose, Bld  10/04/2024 151 (H)  70 - 99 mg/dL Final   BUN 89/92/7974 12  6 - 20 mg/dL Final   Creatinine, Ser 10/04/2024 0.61  0.61 - 1.24 mg/dL Final   Calcium  10/04/2024 8.7 (L)  8.9 - 10.3 mg/dL Final   Total Protein 89/92/7974 6.2 (L)  6.5 - 8.1 g/dL Final   Albumin  10/04/2024 3.8  3.5 - 5.0 g/dL Final   AST 89/92/7974 11 (L)  15 - 41 U/L Final   ALT 10/04/2024 18  0 - 44 U/L Final   Alkaline Phosphatase 10/04/2024 51  38 - 126 U/L Final   Total Bilirubin 10/04/2024 0.6  0.0 - 1.2 mg/dL Final   GFR, Estimated 10/04/2024 >60  >60 mL/min Final   Anion gap 10/04/2024 4 (L)  5 - 15 Final  Appointment on 09/20/2024  Component Date Value Ref Range Status   WBC Count 09/20/2024 9.7  4.0 - 10.5 K/uL Final   RBC 09/20/2024 5.75  4.22 - 5.81 MIL/uL Final   Hemoglobin 09/20/2024 15.7  13.0 - 17.0 g/dL Final   HCT 90/76/7974 45.9  39.0 - 52.0 % Final   MCV 09/20/2024 79.8 (L)  80.0 - 100.0 fL Final   MCH 09/20/2024 27.3  26.0 - 34.0 pg Final   MCHC 09/20/2024 34.2  30.0 - 36.0 g/dL Final   RDW 90/76/7974 15.0  11.5 - 15.5 % Final   Platelet Count 09/20/2024 169  150 - 400 K/uL Final   nRBC 09/20/2024 0.0  0.0 - 0.2 % Final   Neutrophils Relative % 09/20/2024 51  % Final   Neutro Abs 09/20/2024 4.9  1.7 - 7.7 K/uL Final   Lymphocytes Relative 09/20/2024 39  %  Final   Lymphs Abs 09/20/2024 3.8  0.7 - 4.0 K/uL Final   Monocytes Relative 09/20/2024 7  % Final   Monocytes Absolute 09/20/2024 0.7  0.1 - 1.0 K/uL Final   Eosinophils Relative 09/20/2024 3  % Final   Eosinophils Absolute 09/20/2024 0.3  0.0 - 0.5 K/uL Final   Basophils Relative 09/20/2024 0  % Final   Basophils Absolute 09/20/2024 0.0  0.0 - 0.1 K/uL Final   Immature Granulocytes 09/20/2024 0  % Final   Abs Immature Granulocytes 09/20/2024 0.03  0.00 - 0.07 K/uL Final   Sodium 09/20/2024 138  135 - 145 mmol/L Final   Potassium 09/20/2024 3.5  3.5 - 5.1 mmol/L Final   Chloride 09/20/2024 106  98 - 111 mmol/L Final   CO2 09/20/2024 26  22 -  32 mmol/L Final   Glucose, Bld 09/20/2024 221 (H)  70 - 99 mg/dL Final   BUN 90/76/7974 11  6 - 20 mg/dL Final   Creatinine, Ser 09/20/2024 0.74  0.61 - 1.24 mg/dL Final   Calcium  09/20/2024 8.6 (L)  8.9 - 10.3 mg/dL Final   Total Protein 90/76/7974 6.3 (L)  6.5 - 8.1 g/dL Final   Albumin  09/20/2024 4.1  3.5 - 5.0 g/dL Final   AST 90/76/7974 14 (L)  15 - 41 U/L Final   ALT 09/20/2024 18  0 - 44 U/L Final   Alkaline Phosphatase 09/20/2024 50  38 - 126 U/L Final   Total Bilirubin 09/20/2024 0.7  0.0 - 1.2 mg/dL Final   GFR, Estimated 09/20/2024 >60  >60 mL/min Final   Anion gap 09/20/2024 6  5 - 15 Final   IgG (Immunoglobin G), Serum 09/20/2024 664  603 - 1,613 mg/dL Final   IgA 90/76/7974 121  90 - 386 mg/dL Final   IgM (Immunoglobulin M), Srm 09/20/2024 28  20 - 172 mg/dL Final   Total Protein ELP 09/20/2024 5.8 (L)  6.0 - 8.5 g/dL Corrected   Albumin  SerPl Elph-Mcnc 09/20/2024 3.4  2.9 - 4.4 g/dL Corrected   Alpha 1 90/76/7974 0.1  0.0 - 0.4 g/dL Corrected   Alpha2 Glob SerPl Elph-Mcnc 09/20/2024 0.9  0.4 - 1.0 g/dL Corrected   B-Globulin SerPl Elph-Mcnc 09/20/2024 0.8  0.7 - 1.3 g/dL Corrected   Gamma Glob SerPl Elph-Mcnc 09/20/2024 0.6  0.4 - 1.8 g/dL Corrected   M Protein SerPl Elph-Mcnc 09/20/2024 0.3 (H)  Not Observed g/dL Corrected   Globulin, Total 09/20/2024 2.4  2.2 - 3.9 g/dL Corrected   Albumin /Glob SerPl 09/20/2024 1.5  0.7 - 1.7 Corrected   IFE 1 09/20/2024 Comment (A)   Corrected   Please Note 09/20/2024 Comment   Corrected  Appointment on 09/06/2024  Component Date Value Ref Range Status   WBC Count 09/06/2024 7.4  4.0 - 10.5 K/uL Final   RBC 09/06/2024 5.65  4.22 - 5.81 MIL/uL Final   Hemoglobin 09/06/2024 15.2  13.0 - 17.0 g/dL Final   HCT 90/90/7974 45.5  39.0 - 52.0 % Final   MCV 09/06/2024 80.5  80.0 - 100.0 fL Final   MCH 09/06/2024 26.9  26.0 - 34.0 pg Final   MCHC 09/06/2024 33.4  30.0 - 36.0 g/dL Final   RDW 90/90/7974 14.9  11.5 - 15.5 % Final   Platelet  Count 09/06/2024 198  150 - 400 K/uL Final   nRBC 09/06/2024 0.0  0.0 - 0.2 % Final   Neutrophils Relative % 09/06/2024 51  % Final   Neutro Abs 09/06/2024 3.7  1.7 -  7.7 K/uL Final   Lymphocytes Relative 09/06/2024 39  % Final   Lymphs Abs 09/06/2024 2.9  0.7 - 4.0 K/uL Final   Monocytes Relative 09/06/2024 8  % Final   Monocytes Absolute 09/06/2024 0.6  0.1 - 1.0 K/uL Final   Eosinophils Relative 09/06/2024 1  % Final   Eosinophils Absolute 09/06/2024 0.1  0.0 - 0.5 K/uL Final   Basophils Relative 09/06/2024 1  % Final   Basophils Absolute 09/06/2024 0.1  0.0 - 0.1 K/uL Final   Immature Granulocytes 09/06/2024 0  % Final   Abs Immature Granulocytes 09/06/2024 0.02  0.00 - 0.07 K/uL Final   Sodium 09/06/2024 138  135 - 145 mmol/L Final   Potassium 09/06/2024 3.6  3.5 - 5.1 mmol/L Final   Chloride 09/06/2024 106  98 - 111 mmol/L Final   CO2 09/06/2024 26  22 - 32 mmol/L Final   Glucose, Bld 09/06/2024 176 (H)  70 - 99 mg/dL Final   BUN 90/90/7974 13  6 - 20 mg/dL Final   Creatinine, Ser 09/06/2024 0.79  0.61 - 1.24 mg/dL Final   Calcium  09/06/2024 8.6 (L)  8.9 - 10.3 mg/dL Final   Total Protein 90/90/7974 6.1 (L)  6.5 - 8.1 g/dL Final   Albumin  09/06/2024 3.9  3.5 - 5.0 g/dL Final   AST 90/90/7974 13 (L)  15 - 41 U/L Final   ALT 09/06/2024 15  0 - 44 U/L Final   Alkaline Phosphatase 09/06/2024 53  38 - 126 U/L Final   Total Bilirubin 09/06/2024 0.6  0.0 - 1.2 mg/dL Final   GFR, Estimated 09/06/2024 >60  >60 mL/min Final   Anion gap 09/06/2024 6  5 - 15 Final   IgG (Immunoglobin G), Serum 09/06/2024 657  603 - 1,613 mg/dL Final   IgA 90/90/7974 124  90 - 386 mg/dL Final   IgM (Immunoglobulin M), Srm 09/06/2024 25  20 - 172 mg/dL Final   Total Protein ELP 09/06/2024 6.0  6.0 - 8.5 g/dL Corrected   Albumin  SerPl Elph-Mcnc 09/06/2024 3.3  2.9 - 4.4 g/dL Corrected   Alpha 1 90/90/7974 0.2  0.0 - 0.4 g/dL Corrected   Alpha2 Glob SerPl Elph-Mcnc 09/06/2024 0.9  0.4 - 1.0 g/dL Corrected    B-Globulin SerPl Elph-Mcnc 09/06/2024 0.9  0.7 - 1.3 g/dL Corrected   Gamma Glob SerPl Elph-Mcnc 09/06/2024 0.7  0.4 - 1.8 g/dL Corrected   M Protein SerPl Elph-Mcnc 09/06/2024 0.4 (H)  Not Observed g/dL Corrected   Globulin, Total 09/06/2024 2.7  2.2 - 3.9 g/dL Corrected   Albumin /Glob SerPl 09/06/2024 1.3  0.7 - 1.7 Corrected   IFE 1 09/06/2024 Comment (A)   Corrected   Please Note 09/06/2024 Comment   Corrected  Appointment on 08/23/2024  Component Date Value Ref Range Status   WBC Count 08/23/2024 10.7 (H)  4.0 - 10.5 K/uL Final   RBC 08/23/2024 5.64  4.22 - 5.81 MIL/uL Final   Hemoglobin 08/23/2024 15.2  13.0 - 17.0 g/dL Final   HCT 91/73/7974 45.5  39.0 - 52.0 % Final   MCV 08/23/2024 80.7  80.0 - 100.0 fL Final   MCH 08/23/2024 27.0  26.0 - 34.0 pg Final   MCHC 08/23/2024 33.4  30.0 - 36.0 g/dL Final   RDW 91/73/7974 15.7 (H)  11.5 - 15.5 % Final   Platelet Count 08/23/2024 191  150 - 400 K/uL Final   nRBC 08/23/2024 0.0  0.0 - 0.2 % Final   Neutrophils Relative % 08/23/2024 55  %  Final   Neutro Abs 08/23/2024 5.9  1.7 - 7.7 K/uL Final   Lymphocytes Relative 08/23/2024 34  % Final   Lymphs Abs 08/23/2024 3.7  0.7 - 4.0 K/uL Final   Monocytes Relative 08/23/2024 9  % Final   Monocytes Absolute 08/23/2024 0.9  0.1 - 1.0 K/uL Final   Eosinophils Relative 08/23/2024 1  % Final   Eosinophils Absolute 08/23/2024 0.1  0.0 - 0.5 K/uL Final   Basophils Relative 08/23/2024 1  % Final   Basophils Absolute 08/23/2024 0.1  0.0 - 0.1 K/uL Final   Immature Granulocytes 08/23/2024 0  % Final   Abs Immature Granulocytes 08/23/2024 0.02  0.00 - 0.07 K/uL Final   Sodium 08/23/2024 137  135 - 145 mmol/L Final   Potassium 08/23/2024 3.8  3.5 - 5.1 mmol/L Final   Chloride 08/23/2024 106  98 - 111 mmol/L Final   CO2 08/23/2024 23  22 - 32 mmol/L Final   Glucose, Bld 08/23/2024 113 (H)  70 - 99 mg/dL Final   BUN 91/73/7974 18  6 - 20 mg/dL Final   Creatinine, Ser 08/23/2024 0.70  0.61 - 1.24  mg/dL Final   Calcium  08/23/2024 8.6 (L)  8.9 - 10.3 mg/dL Final   Total Protein 91/73/7974 6.2 (L)  6.5 - 8.1 g/dL Final   Albumin  08/23/2024 4.0  3.5 - 5.0 g/dL Final   AST 91/73/7974 15  15 - 41 U/L Final   ALT 08/23/2024 20  0 - 44 U/L Final   Alkaline Phosphatase 08/23/2024 57  38 - 126 U/L Final   Total Bilirubin 08/23/2024 0.6  0.0 - 1.2 mg/dL Final   GFR, Estimated 08/23/2024 >60  >60 mL/min Final   Anion gap 08/23/2024 8  5 - 15 Final   IgG (Immunoglobin G), Serum 08/23/2024 678  603 - 1,613 mg/dL Final   IgA 91/73/7974 123  90 - 386 mg/dL Final   IgM (Immunoglobulin M), Srm 08/23/2024 29  20 - 172 mg/dL Final   Total Protein ELP 08/23/2024 6.0  6.0 - 8.5 g/dL Corrected   Albumin  SerPl Elph-Mcnc 08/23/2024 3.6  2.9 - 4.4 g/dL Corrected   Alpha 1 91/73/7974 0.3  0.0 - 0.4 g/dL Corrected   Alpha2 Glob SerPl Elph-Mcnc 08/23/2024 0.7  0.4 - 1.0 g/dL Corrected   B-Globulin SerPl Elph-Mcnc 08/23/2024 0.9  0.7 - 1.3 g/dL Corrected   Gamma Glob SerPl Elph-Mcnc 08/23/2024 0.6  0.4 - 1.8 g/dL Corrected   M Protein SerPl Elph-Mcnc 08/23/2024 0.3 (H)  Not Observed g/dL Corrected   Globulin, Total 08/23/2024 2.4  2.2 - 3.9 g/dL Corrected   Albumin /Glob SerPl 08/23/2024 1.6  0.7 - 1.7 Corrected   IFE 1 08/23/2024 Comment (A)   Corrected   Please Note 08/23/2024 Comment   Corrected  Appointment on 08/09/2024  Component Date Value Ref Range Status   WBC Count 08/09/2024 8.9  4.0 - 10.5 K/uL Final   RBC 08/09/2024 5.88 (H)  4.22 - 5.81 MIL/uL Final   Hemoglobin 08/09/2024 15.9  13.0 - 17.0 g/dL Final   HCT 91/87/7974 46.9  39.0 - 52.0 % Final   MCV 08/09/2024 79.8 (L)  80.0 - 100.0 fL Final   MCH 08/09/2024 27.0  26.0 - 34.0 pg Final   MCHC 08/09/2024 33.9  30.0 - 36.0 g/dL Final   RDW 91/87/7974 15.9 (H)  11.5 - 15.5 % Final   Platelet Count 08/09/2024 182  150 - 400 K/uL Final   nRBC 08/09/2024 0.0  0.0 -  0.2 % Final   Neutrophils Relative % 08/09/2024 45  % Final   Neutro Abs  08/09/2024 4.0  1.7 - 7.7 K/uL Final   Lymphocytes Relative 08/09/2024 42  % Final   Lymphs Abs 08/09/2024 3.7  0.7 - 4.0 K/uL Final   Monocytes Relative 08/09/2024 11  % Final   Monocytes Absolute 08/09/2024 1.0  0.1 - 1.0 K/uL Final   Eosinophils Relative 08/09/2024 1  % Final   Eosinophils Absolute 08/09/2024 0.1  0.0 - 0.5 K/uL Final   Basophils Relative 08/09/2024 1  % Final   Basophils Absolute 08/09/2024 0.1  0.0 - 0.1 K/uL Final   Immature Granulocytes 08/09/2024 0  % Final   Abs Immature Granulocytes 08/09/2024 0.02  0.00 - 0.07 K/uL Final   Sodium 08/09/2024 139  135 - 145 mmol/L Final   Potassium 08/09/2024 3.9  3.5 - 5.1 mmol/L Final   Chloride 08/09/2024 106  98 - 111 mmol/L Final   CO2 08/09/2024 27  22 - 32 mmol/L Final   Glucose, Bld 08/09/2024 112 (H)  70 - 99 mg/dL Final   BUN 91/87/7974 12  6 - 20 mg/dL Final   Creatinine, Ser 08/09/2024 0.69  0.61 - 1.24 mg/dL Final   Calcium  08/09/2024 8.8 (L)  8.9 - 10.3 mg/dL Final   Total Protein 91/87/7974 6.5  6.5 - 8.1 g/dL Final   Albumin  08/09/2024 4.3  3.5 - 5.0 g/dL Final   AST 91/87/7974 18  15 - 41 U/L Final   ALT 08/09/2024 23  0 - 44 U/L Final   Alkaline Phosphatase 08/09/2024 62  38 - 126 U/L Final   Total Bilirubin 08/09/2024 1.0  0.0 - 1.2 mg/dL Final   GFR, Estimated 08/09/2024 >60  >60 mL/min Final   Anion gap 08/09/2024 6  5 - 15 Final   IgG (Immunoglobin G), Serum 08/09/2024 738  603 - 1,613 mg/dL Final   IgA 91/87/7974 144  90 - 386 mg/dL Final   IgM (Immunoglobulin M), Srm 08/09/2024 34  20 - 172 mg/dL Final   Total Protein ELP 08/09/2024 6.1  6.0 - 8.5 g/dL Corrected   Albumin  SerPl Elph-Mcnc 08/09/2024 3.4  2.9 - 4.4 g/dL Corrected   Alpha 1 91/87/7974 0.1  0.0 - 0.4 g/dL Corrected   Alpha2 Glob SerPl Elph-Mcnc 08/09/2024 0.9  0.4 - 1.0 g/dL Corrected   B-Globulin SerPl Elph-Mcnc 08/09/2024 0.9  0.7 - 1.3 g/dL Corrected   Gamma Glob SerPl Elph-Mcnc 08/09/2024 0.7  0.4 - 1.8 g/dL Corrected   M Protein  SerPl Elph-Mcnc 08/09/2024 0.3 (H)  Not Observed g/dL Corrected   Globulin, Total 08/09/2024 2.7  2.2 - 3.9 g/dL Corrected   Albumin /Glob SerPl 08/09/2024 1.3  0.7 - 1.7 Corrected   IFE 1 08/09/2024 Comment (A)   Corrected   Please Note 08/09/2024 Comment   Corrected  Appointment on 07/19/2024  Component Date Value Ref Range Status   WBC Count 07/19/2024 6.4  4.0 - 10.5 K/uL Final   RBC 07/19/2024 5.37  4.22 - 5.81 MIL/uL Final   Hemoglobin 07/19/2024 14.5  13.0 - 17.0 g/dL Final   HCT 92/77/7974 42.3  39.0 - 52.0 % Final   MCV 07/19/2024 78.8 (L)  80.0 - 100.0 fL Final   MCH 07/19/2024 27.0  26.0 - 34.0 pg Final   MCHC 07/19/2024 34.3  30.0 - 36.0 g/dL Final   RDW 92/77/7974 15.9 (H)  11.5 - 15.5 % Final   Platelet Count 07/19/2024 163  150 -  400 K/uL Final   nRBC 07/19/2024 0.0  0.0 - 0.2 % Final   Neutrophils Relative % 07/19/2024 41  % Final   Neutro Abs 07/19/2024 2.6  1.7 - 7.7 K/uL Final   Lymphocytes Relative 07/19/2024 43  % Final   Lymphs Abs 07/19/2024 2.8  0.7 - 4.0 K/uL Final   Monocytes Relative 07/19/2024 10  % Final   Monocytes Absolute 07/19/2024 0.6  0.1 - 1.0 K/uL Final   Eosinophils Relative 07/19/2024 5  % Final   Eosinophils Absolute 07/19/2024 0.3  0.0 - 0.5 K/uL Final   Basophils Relative 07/19/2024 1  % Final   Basophils Absolute 07/19/2024 0.0  0.0 - 0.1 K/uL Final   Immature Granulocytes 07/19/2024 0  % Final   Abs Immature Granulocytes 07/19/2024 0.02  0.00 - 0.07 K/uL Final   Sodium 07/19/2024 140  135 - 145 mmol/L Final   Potassium 07/19/2024 3.7  3.5 - 5.1 mmol/L Final   Chloride 07/19/2024 106  98 - 111 mmol/L Final   CO2 07/19/2024 26  22 - 32 mmol/L Final   Glucose, Bld 07/19/2024 145 (H)  70 - 99 mg/dL Final   BUN 92/77/7974 11  6 - 20 mg/dL Final   Creatinine, Ser 07/19/2024 0.75  0.61 - 1.24 mg/dL Final   Calcium  07/19/2024 8.6 (L)  8.9 - 10.3 mg/dL Final   Total Protein 92/77/7974 6.1 (L)  6.5 - 8.1 g/dL Final   Albumin  07/19/2024 3.7  3.5 -  5.0 g/dL Final   AST 92/77/7974 14 (L)  15 - 41 U/L Final   ALT 07/19/2024 14  0 - 44 U/L Final   Alkaline Phosphatase 07/19/2024 49  38 - 126 U/L Final   Total Bilirubin 07/19/2024 0.7  0.0 - 1.2 mg/dL Final   GFR, Estimated 07/19/2024 >60  >60 mL/min Final   Anion gap 07/19/2024 8  5 - 15 Final   IgG (Immunoglobin G), Serum 07/19/2024 654  603 - 1,613 mg/dL Final   IgA 92/77/7974 133  90 - 386 mg/dL Final   IgM (Immunoglobulin M), Srm 07/19/2024 21  20 - 172 mg/dL Final   Total Protein ELP 07/19/2024 5.7 (L)  6.0 - 8.5 g/dL Corrected   Albumin  SerPl Elph-Mcnc 07/19/2024 3.3  2.9 - 4.4 g/dL Corrected   Alpha 1 92/77/7974 0.1  0.0 - 0.4 g/dL Corrected   Alpha2 Glob SerPl Elph-Mcnc 07/19/2024 0.9  0.4 - 1.0 g/dL Corrected   B-Globulin SerPl Elph-Mcnc 07/19/2024 0.7  0.7 - 1.3 g/dL Corrected   Gamma Glob SerPl Elph-Mcnc 07/19/2024 0.7  0.4 - 1.8 g/dL Corrected   M Protein SerPl Elph-Mcnc 07/19/2024 0.3 (H)  Not Observed g/dL Corrected   Globulin, Total 07/19/2024 2.4  2.2 - 3.9 g/dL Corrected   Albumin /Glob SerPl 07/19/2024 1.4  0.7 - 1.7 Corrected   IFE 1 07/19/2024 Comment (A)   Corrected   Please Note 07/19/2024 Comment   Corrected  Appointment on 07/05/2024  Component Date Value Ref Range Status   WBC Count 07/05/2024 7.0  4.0 - 10.5 K/uL Final   RBC 07/05/2024 5.42  4.22 - 5.81 MIL/uL Final   Hemoglobin 07/05/2024 14.5  13.0 - 17.0 g/dL Final   HCT 92/91/7974 42.9  39.0 - 52.0 % Final   MCV 07/05/2024 79.2 (L)  80.0 - 100.0 fL Final   MCH 07/05/2024 26.8  26.0 - 34.0 pg Final   MCHC 07/05/2024 33.8  30.0 - 36.0 g/dL Final   RDW 92/91/7974 15.9 (H)  11.5 -  15.5 % Final   Platelet Count 07/05/2024 164  150 - 400 K/uL Final   nRBC 07/05/2024 0.0  0.0 - 0.2 % Final   Neutrophils Relative % 07/05/2024 45  % Final   Neutro Abs 07/05/2024 3.2  1.7 - 7.7 K/uL Final   Lymphocytes Relative 07/05/2024 43  % Final   Lymphs Abs 07/05/2024 3.0  0.7 - 4.0 K/uL Final   Monocytes Relative  07/05/2024 7  % Final   Monocytes Absolute 07/05/2024 0.5  0.1 - 1.0 K/uL Final   Eosinophils Relative 07/05/2024 4  % Final   Eosinophils Absolute 07/05/2024 0.3  0.0 - 0.5 K/uL Final   Basophils Relative 07/05/2024 1  % Final   Basophils Absolute 07/05/2024 0.0  0.0 - 0.1 K/uL Final   Immature Granulocytes 07/05/2024 0  % Final   Abs Immature Granulocytes 07/05/2024 0.02  0.00 - 0.07 K/uL Final   Sodium 07/05/2024 139  135 - 145 mmol/L Final   Potassium 07/05/2024 3.4 (L)  3.5 - 5.1 mmol/L Final   Chloride 07/05/2024 105  98 - 111 mmol/L Final   CO2 07/05/2024 26  22 - 32 mmol/L Final   Glucose, Bld 07/05/2024 139 (H)  70 - 99 mg/dL Final   BUN 92/91/7974 14  6 - 20 mg/dL Final   Creatinine, Ser 07/05/2024 0.77  0.61 - 1.24 mg/dL Final   Calcium  07/05/2024 8.6 (L)  8.9 - 10.3 mg/dL Final   Total Protein 92/91/7974 5.9 (L)  6.5 - 8.1 g/dL Final   Albumin  07/05/2024 3.8  3.5 - 5.0 g/dL Final   AST 92/91/7974 13 (L)  15 - 41 U/L Final   ALT 07/05/2024 18  0 - 44 U/L Final   Alkaline Phosphatase 07/05/2024 57  38 - 126 U/L Final   Total Bilirubin 07/05/2024 0.8  0.0 - 1.2 mg/dL Final   GFR, Estimated 07/05/2024 >60  >60 mL/min Final   Anion gap 07/05/2024 8  5 - 15 Final   IgG (Immunoglobin G), Serum 07/05/2024 664  603 - 1,613 mg/dL Final   IgA 92/91/7974 139  90 - 386 mg/dL Final   IgM (Immunoglobulin M), Srm 07/05/2024 21  20 - 172 mg/dL Final   Total Protein ELP 07/05/2024 5.8 (L)  6.0 - 8.5 g/dL Corrected   Albumin  SerPl Elph-Mcnc 07/05/2024 3.6  2.9 - 4.4 g/dL Corrected   Alpha 1 92/91/7974 0.1  0.0 - 0.4 g/dL Corrected   Alpha2 Glob SerPl Elph-Mcnc 07/05/2024 0.8  0.4 - 1.0 g/dL Corrected   B-Globulin SerPl Elph-Mcnc 07/05/2024 0.8  0.7 - 1.3 g/dL Corrected   Gamma Glob SerPl Elph-Mcnc 07/05/2024 0.6  0.4 - 1.8 g/dL Corrected   M Protein SerPl Elph-Mcnc 07/05/2024 0.3 (H)  Not Observed g/dL Corrected   Globulin, Total 07/05/2024 2.2  2.2 - 3.9 g/dL Corrected   Albumin /Glob  SerPl 07/05/2024 1.7  0.7 - 1.7 Corrected   IFE 1 07/05/2024 Comment (A)   Corrected   Please Note 07/05/2024 Comment   Corrected  Office Visit on 06/24/2024  Component Date Value Ref Range Status   Magnesium 06/24/2024 1.8  1.5 - 2.5 mg/dL Final   VITD 93/72/7974 16.19 (L)  30.00 - 100.00 ng/mL Final   Microalb, Ur 06/24/2024 <0.7  mg/dL Final   Creatinine,U 93/72/7974 79.0  mg/dL Final   Microalb Creat Ratio 06/24/2024 Unable to calculate  0.0 - 30.0 mg/g Final   Hgb A1c MFr Bld 06/24/2024 7.1 (H)  4.6 - 6.5 % Final  Appointment  on 06/10/2024  Component Date Value Ref Range Status   WBC Count 06/10/2024 6.5  4.0 - 10.5 K/uL Final   RBC 06/10/2024 5.12  4.22 - 5.81 MIL/uL Final   Hemoglobin 06/10/2024 13.5  13.0 - 17.0 g/dL Final   HCT 93/86/7974 39.9  39.0 - 52.0 % Final   MCV 06/10/2024 77.9 (L)  80.0 - 100.0 fL Final   MCH 06/10/2024 26.4  26.0 - 34.0 pg Final   MCHC 06/10/2024 33.8  30.0 - 36.0 g/dL Final   RDW 93/86/7974 15.7 (H)  11.5 - 15.5 % Final   Platelet Count 06/10/2024 154  150 - 400 K/uL Final   nRBC 06/10/2024 0.0  0.0 - 0.2 % Final   Neutrophils Relative % 06/10/2024 43  % Final   Neutro Abs 06/10/2024 2.9  1.7 - 7.7 K/uL Final   Lymphocytes Relative 06/10/2024 35  % Final   Lymphs Abs 06/10/2024 2.2  0.7 - 4.0 K/uL Final   Monocytes Relative 06/10/2024 10  % Final   Monocytes Absolute 06/10/2024 0.7  0.1 - 1.0 K/uL Final   Eosinophils Relative 06/10/2024 10  % Final   Eosinophils Absolute 06/10/2024 0.6 (H)  0.0 - 0.5 K/uL Final   Basophils Relative 06/10/2024 2  % Final   Basophils Absolute 06/10/2024 0.1  0.0 - 0.1 K/uL Final   Immature Granulocytes 06/10/2024 0  % Final   Abs Immature Granulocytes 06/10/2024 0.01  0.00 - 0.07 K/uL Final   Sodium 06/10/2024 141  135 - 145 mmol/L Final   Potassium 06/10/2024 3.1 (L)  3.5 - 5.1 mmol/L Final   Chloride 06/10/2024 111  98 - 111 mmol/L Final   CO2 06/10/2024 25  22 - 32 mmol/L Final   Glucose, Bld 06/10/2024 153  (H)  70 - 99 mg/dL Final   BUN 93/86/7974 14  6 - 20 mg/dL Final   Creatinine, Ser 06/10/2024 0.68  0.61 - 1.24 mg/dL Final   Calcium  06/10/2024 8.2 (L)  8.9 - 10.3 mg/dL Final   Total Protein 93/86/7974 5.9 (L)  6.5 - 8.1 g/dL Final   Albumin  06/10/2024 3.8  3.5 - 5.0 g/dL Final   AST 93/86/7974 17  15 - 41 U/L Final   ALT 06/10/2024 27  0 - 44 U/L Final   Alkaline Phosphatase 06/10/2024 56  38 - 126 U/L Final   Total Bilirubin 06/10/2024 0.7  0.0 - 1.2 mg/dL Final   GFR, Estimated 06/10/2024 >60  >60 mL/min Final   Anion gap 06/10/2024 5  5 - 15 Final   IgG (Immunoglobin G), Serum 06/10/2024 570 (L)  603 - 1,613 mg/dL Final   IgA 93/86/7974 133  90 - 386 mg/dL Final   IgM (Immunoglobulin M), Srm 06/10/2024 15 (L)  20 - 172 mg/dL Final   Total Protein ELP 06/10/2024 5.6 (L)  6.0 - 8.5 g/dL Corrected   Albumin  SerPl Elph-Mcnc 06/10/2024 3.2  2.9 - 4.4 g/dL Corrected   Alpha 1 93/86/7974 0.1  0.0 - 0.4 g/dL Corrected   Alpha2 Glob SerPl Elph-Mcnc 06/10/2024 0.9  0.4 - 1.0 g/dL Corrected   B-Globulin SerPl Elph-Mcnc 06/10/2024 0.9  0.7 - 1.3 g/dL Corrected   Gamma Glob SerPl Elph-Mcnc 06/10/2024 0.6  0.4 - 1.8 g/dL Corrected   M Protein SerPl Elph-Mcnc 06/10/2024 0.2 (H)  Not Observed g/dL Corrected   Globulin, Total 06/10/2024 2.4  2.2 - 3.9 g/dL Corrected   Albumin /Glob SerPl 06/10/2024 1.4  0.7 - 1.7 Corrected   IFE 1 06/10/2024  Comment (A)   Corrected   Please Note 06/10/2024 Comment   Corrected  There may be more visits with results that are not included.  No image results found. No results found.       ASSESSMENT & PLAN   Assessment & Plan Diabetes mellitus with proteinuria (HCC) Type 2 diabetes mellitus with hyperglycemia, with long-term current use of insulin  (HCC) Blood glucose levels have improved but remain elevated, especially after rice consumption. Continuous glucose monitoring confirms spikes post-rice. He is currently on Farxiga , Metformin , and Mounjaro . Insulin   therapy was discussed but he prefers to avoid injections. Continue Farxiga , Metformin , and Mounjaro . Perform blood work today to assess average blood glucose, vitamin D , and aldosterone levels. Conduct a urine test for kidney damage. Encourage dietary modifications by replacing white rice with cauliflower rice, parboiled rice, or zoodles. Continue using the continuous glucose meter. Follow up in 3 months to reassess blood sugar control and dietary adherence. Primary hypertension Vitamin D  deficiency Shared decision-making done; patient understood rationale and agreed to labwork  Hypokalemia Potassium levels have improved with supplementation. Continue potassium supplementation. Check aldosterone levels in today's blood work. Language barrier affecting health care Due to language barrier, an interpreter was present during the history-taking and subsequent discussion (and for part of the physical exam) with this patient.   ORDER ASSOCIATIONS  #   DIAGNOSIS / CONDITION ICD-10 ENCOUNTER ORDER     ICD-10-CM   1. Diabetes mellitus with proteinuria (HCC)  E11.29    R80.9     2. Primary hypertension  I10 Aldosterone + renin activity w/ ratio    3. Type 2 diabetes mellitus with hyperglycemia, with long-term current use of insulin  (HCC)  E11.65 CBC with Differential/Platelet   Z79.4 Comprehensive metabolic panel with GFR    Lipid panel    Hemoglobin A1c    Microalbumin / creatinine urine ratio    4. Vitamin D  deficiency  E55.9 Vitamin D  (25 hydroxy)    5. Hypokalemia  E87.6 Aldosterone + renin activity w/ ratio           Orders Placed in Encounter:   Lab Orders         CBC with Differential/Platelet         Comprehensive metabolic panel with GFR         Lipid panel         Hemoglobin A1c         Microalbumin / creatinine urine ratio         Vitamin D  (25 hydroxy)         Aldosterone + renin activity w/ ratio            This document was synthesized by artificial intelligence  ([employer]) using HIPAA-compliant recording of the clinical interaction;   We discussed the use of AI scribe software for clinical note transcription with the patient, who gave verbal consent to proceed. additional Info: This encounter employed state-of-the-art, real-time, collaborative documentation. The patient actively reviewed and assisted in updating their electronic medical record on a shared screen, ensuring transparency and facilitating joint problem-solving for the problem list, overview, and plan. This approach promotes accurate, informed care. The treatment plan was discussed and reviewed in detail, including medication safety, potential side effects, and all patient questions. We confirmed understanding and comfort with the plan. Follow-up instructions were established, including contacting the office for any concerns, returning if symptoms worsen, persist, or new symptoms develop, and precautions for potential emergency department visits.

## 2024-10-06 NOTE — Assessment & Plan Note (Signed)
 Due to language barrier, an interpreter was present during the history-taking and subsequent discussion (and for part of the physical exam) with this patient.

## 2024-10-06 NOTE — Assessment & Plan Note (Signed)
 Shared decision-making done; patient understood rationale and agreed to [facility]

## 2024-10-06 NOTE — Addendum Note (Signed)
 Addended by: Ashwika Freels K on: 10/06/2024 03:00 PM   Modules accepted: Orders

## 2024-10-06 NOTE — Assessment & Plan Note (Signed)
 Blood glucose levels have improved but remain elevated, especially after rice consumption. Continuous glucose monitoring confirms spikes post-rice. He is currently on Farxiga , Metformin , and Mounjaro . Insulin  therapy was discussed but he prefers to avoid injections. Continue Farxiga , Metformin , and Mounjaro . Perform blood work today to assess average blood glucose, vitamin D , and aldosterone levels. Conduct a urine test for kidney damage. Encourage dietary modifications by replacing white rice with cauliflower rice, parboiled rice, or zoodles. Continue using the continuous glucose meter. Follow up in 3 months to reassess blood sugar control and dietary adherence.

## 2024-10-06 NOTE — Addendum Note (Signed)
 Addended by: BILLIE GRAYCE POUR on: 10/06/2024 02:59 PM   Modules accepted: Orders

## 2024-10-07 ENCOUNTER — Ambulatory Visit: Payer: Self-pay | Admitting: Internal Medicine

## 2024-10-07 ENCOUNTER — Other Ambulatory Visit: Payer: Self-pay

## 2024-10-07 LAB — MULTIPLE MYELOMA PANEL, SERUM
Albumin SerPl Elph-Mcnc: 3.2 g/dL (ref 2.9–4.4)
Albumin/Glob SerPl: 1.4 (ref 0.7–1.7)
Alpha 1: 0.1 g/dL (ref 0.0–0.4)
Alpha2 Glob SerPl Elph-Mcnc: 0.8 g/dL (ref 0.4–1.0)
B-Globulin SerPl Elph-Mcnc: 0.8 g/dL (ref 0.7–1.3)
Gamma Glob SerPl Elph-Mcnc: 0.6 g/dL (ref 0.4–1.8)
Globulin, Total: 2.3 g/dL (ref 2.2–3.9)
IgA: 108 mg/dL (ref 90–386)
IgG (Immunoglobin G), Serum: 616 mg/dL (ref 603–1613)
IgM (Immunoglobulin M), Srm: 25 mg/dL (ref 20–172)
M Protein SerPl Elph-Mcnc: 0.3 g/dL — ABNORMAL HIGH
Total Protein ELP: 5.5 g/dL — ABNORMAL LOW (ref 6.0–8.5)

## 2024-10-07 LAB — HEMOGLOBIN A1C
Est. average glucose Bld gHb Est-mCnc: 151 mg/dL
Hgb A1c MFr Bld: 6.9 % — ABNORMAL HIGH (ref 4.8–5.6)

## 2024-10-09 ENCOUNTER — Ambulatory Visit: Payer: Self-pay | Admitting: Internal Medicine

## 2024-10-10 NOTE — Telephone Encounter (Signed)
 Sent pt message via [employer]

## 2024-10-18 ENCOUNTER — Inpatient Hospital Stay

## 2024-10-18 ENCOUNTER — Inpatient Hospital Stay: Admitting: Hematology

## 2024-10-18 VITALS — BP 129/84 | HR 60 | Temp 97.3°F | Resp 20 | Wt 169.7 lb

## 2024-10-18 DIAGNOSIS — Z7189 Other specified counseling: Secondary | ICD-10-CM | POA: Diagnosis not present

## 2024-10-18 DIAGNOSIS — C9001 Multiple myeloma in remission: Secondary | ICD-10-CM | POA: Diagnosis not present

## 2024-10-18 DIAGNOSIS — Z5111 Encounter for antineoplastic chemotherapy: Secondary | ICD-10-CM | POA: Diagnosis not present

## 2024-10-18 DIAGNOSIS — Z5112 Encounter for antineoplastic immunotherapy: Secondary | ICD-10-CM | POA: Diagnosis not present

## 2024-10-18 LAB — CBC WITH DIFFERENTIAL (CANCER CENTER ONLY)
Abs Immature Granulocytes: 0.02 K/uL (ref 0.00–0.07)
Basophils Absolute: 0.1 K/uL (ref 0.0–0.1)
Basophils Relative: 1 %
Eosinophils Absolute: 0.3 K/uL (ref 0.0–0.5)
Eosinophils Relative: 3 %
HCT: 50.3 % (ref 39.0–52.0)
Hemoglobin: 17 g/dL (ref 13.0–17.0)
Immature Granulocytes: 0 %
Lymphocytes Relative: 45 %
Lymphs Abs: 4.6 K/uL — ABNORMAL HIGH (ref 0.7–4.0)
MCH: 27.1 pg (ref 26.0–34.0)
MCHC: 33.8 g/dL (ref 30.0–36.0)
MCV: 80.2 fL (ref 80.0–100.0)
Monocytes Absolute: 0.9 K/uL (ref 0.1–1.0)
Monocytes Relative: 9 %
Neutro Abs: 4.3 K/uL (ref 1.7–7.7)
Neutrophils Relative %: 42 %
Platelet Count: 193 K/uL (ref 150–400)
RBC: 6.27 MIL/uL — ABNORMAL HIGH (ref 4.22–5.81)
RDW: 14.6 % (ref 11.5–15.5)
WBC Count: 10.2 K/uL (ref 4.0–10.5)
nRBC: 0 % (ref 0.0–0.2)

## 2024-10-18 LAB — COMPREHENSIVE METABOLIC PANEL WITH GFR
ALT: 15 U/L (ref 0–44)
AST: 15 U/L (ref 15–41)
Albumin: 4.3 g/dL (ref 3.5–5.0)
Alkaline Phosphatase: 53 U/L (ref 38–126)
Anion gap: 8 (ref 5–15)
BUN: 13 mg/dL (ref 6–20)
CO2: 25 mmol/L (ref 22–32)
Calcium: 9.4 mg/dL (ref 8.9–10.3)
Chloride: 106 mmol/L (ref 98–111)
Creatinine, Ser: 0.69 mg/dL (ref 0.61–1.24)
GFR, Estimated: >60 mL/min (ref >60)
Glucose, Bld: 115 mg/dL — ABNORMAL HIGH (ref 70–99)
Potassium: 3.7 mmol/L (ref 3.5–5.1)
Sodium: 139 mmol/L (ref 135–145)
Total Bilirubin: 0.8 mg/dL (ref 0.0–1.2)
Total Protein: 6.9 g/dL (ref 6.5–8.1)

## 2024-10-18 MED ORDER — ACETAMINOPHEN 325 MG PO TABS
650.0000 mg | ORAL_TABLET | Freq: Once | ORAL | Status: AC
  Administered 2024-10-18: 650 mg via ORAL
  Filled 2024-10-18: qty 2

## 2024-10-18 MED ORDER — FAMOTIDINE 20 MG PO TABS
20.0000 mg | ORAL_TABLET | Freq: Once | ORAL | Status: AC
  Administered 2024-10-18: 20 mg via ORAL
  Filled 2024-10-18: qty 1

## 2024-10-18 MED ORDER — DARATUMUMAB-HYALURONIDASE-FIHJ 1800-30000 MG-UT/15ML ~~LOC~~ SOLN
1800.0000 mg | Freq: Once | SUBCUTANEOUS | Status: AC
  Administered 2024-10-18: 1800 mg via SUBCUTANEOUS
  Filled 2024-10-18: qty 15

## 2024-10-18 MED ORDER — DIPHENHYDRAMINE HCL 25 MG PO CAPS
50.0000 mg | ORAL_CAPSULE | Freq: Once | ORAL | Status: AC
  Administered 2024-10-18: 50 mg via ORAL
  Filled 2024-10-18: qty 2

## 2024-10-18 MED ORDER — MONTELUKAST SODIUM 10 MG PO TABS
10.0000 mg | ORAL_TABLET | Freq: Once | ORAL | Status: AC
  Administered 2024-10-18: 10 mg via ORAL
  Filled 2024-10-18: qty 1

## 2024-10-18 MED ORDER — DEXAMETHASONE 4 MG PO TABS
2.0000 mg | ORAL_TABLET | Freq: Once | ORAL | Status: AC
  Administered 2024-10-18: 2 mg via ORAL
  Filled 2024-10-18: qty 1

## 2024-10-18 NOTE — Patient Instructions (Signed)

## 2024-10-18 NOTE — Progress Notes (Signed)
 [name]  HEMATOLOGY/ONCOLOGY CLINIC NOTE  Date of Service: .10/18/2024  Chief complaint - Follow-up for continued valuation and management of high risk multiple myeloma  HISTORY OF PRESENTING ILLNES: (01/01/2021) Donald Suarez is a wonderful 58 y.o. male who has been referred to us  by Dr. Agapito for evaluation and management of Multiple Myeloma. Pt is accompanied today by his brother-in-law and a Vietnamese interpreter. The pt reports that he is doing well overall.    The pt reports that he was experiencing dizziness and blurry vision prior to his diagnosis. These symptoms started two months ago. He is unable to confirm any other new symptoms at that time. The pt was at work when he became confused and was taken to the hospital. He is still unable to drive due to his blurry vision. He reports minimal improvement in his vision after plasmapheresis and has seen an Opthalmalogist. Pt was started on CyBorD at [facility].    Pt denies any previous chronic medical conditions or chronic medications. The pt had an appendectomy in 2002 and denies any other surgeries. He has no known medication allergies. 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 Pt has received two COVID19 vaccines.    Of note prior to the patient's visit today, pt has had PET/CT completed on 12/26/2020 with results revealing 1. No focal osseous or soft tissue hypermetabolism to suggest metabolically active multiple myeloma. 2. Small lucencies noted within the right frontal calvarium, right T1 vertebral body and T4 vertebral body, nonspecific. Attention on follow-up imaging.   Pt has had MRI Brain completed on 12/26/2020 with results revealing 1. No definite T2 signal abnormality or pathologic enhancement along the visualized optic pathways, noting motion degradation of coronal STIR images through the orbits. No retrobulbar mass and symmetric extraocular muscles. 2. No acute intracranial abnormality. 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 3. Questionable small faintly enhancing lesions in the right parietal and left temporal calvarium, indeterminate but favored to represent vasculature and less likely multiple myelomatous disease. Heterogeneous clivus without associated enhancement, nonspecific.   Pt has had Right Iliac Creast BM Bx completed on 12/25/2020 with results revealing 95% cellular bone marrow with greater than 90% involvement by monoclonal plasma cells showing cytoplasmic lambda light chain restriction.   Most recent lab results (12/31/2020) of CBC is as follows: all values are WNL except for  RBC at 2.70, Hgb at 8.0, HCT at 23.2, RDW at 20.2, PLT at 129K, Sodium at 132, Glucose at 138, Albumin  at 2.1, Total Protein at 11.3, AST at 60, ALT at 190. 12/31/2020 Beta 2 Microglobulin at 3.69 12/24/2020 M Spike at 8.03 g/dL 87/76/7978 IgG at 89768   On review of systems, pt reports blurry vision and denies bone pain, fatigue, loss of appetite, SOB, chest pain, headaches, dental pain, abdominal pain, leg swelling, tingling/numbness in hands/feet, pain along spine and any other symptoms.    On Social Hx the pt reports that he is non-smoker that does not drink much alcohol.    Current treatment-daratumumab /Revlimid /dexamethasone   INTERVAL HISTORY:  Donald Suarez is a 58 y.o. male is here for continued evaluation and management of his high risk multiple myeloma.  Last seen by me on 09/20/2024, where he was doing well without any concerns.  Today, he says that he has been doing well. Denies new bone pains, fevers/chills, night sweats, nausea, diarrhea, or new leg swelling.  Reports tolerating his Daratumumab  + Revlimid  + Dexamethasone  treatment, and Zometa . Denies new skin rashes, new pains, or infection  problems, as well as nausea or GI upset. Endorses compliance with Revlimid .   He does note some vision issues while reading books, and he does wear reading glasses but has not been to see an eye doctor; does not use a  computer much.  Says that he is UTD on recommended vaccinations.   MEDICAL HISTORY:  Past Medical History:  Diagnosis Date   Abfraction 08/06/2022   Accretions on teeth 08/06/2022   Anemia    Attrition, teeth excessive 08/06/2022   Blurry vision 12/20/2020   yet to see ophthalmology due to language barrier  Blurry vision (ICD-10: H53.8) Potentially related to poor glycemic control Plan: Refer to ophthalmology for diabetic retinopathy screening.     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   Managed by oncology  On revlimid      Hypoalbuminemia 05/13/2022   Lab Results      Component    Value    Date           ALBUMIN     3.7    07/19/2024           ALBUMIN     3.8    07/05/2024           ALBUMIN     3.8    06/10/2024           ALBUMIN     3.7    06/03/2024           ALBUMIN     3.7    05/13/2024           ALBUMIN     4.0    04/29/2024           ALBUMIN     3.9    04/15/2024           ALBUMIN     4.0    03/25/2024           ALBUMIN     4.2    03/11/2024         Hypocalcemia 08/19/2022   In setting of multiple myeloma    Hypokalemia 03/09/2023   Lab Results      Component    Value    Date/Time           K    3.7    07/19/2024 07:46 AM           K    3.4 (L)    07/05/2024 09:33 AM           K    3.1 (L)    06/10/2024 12:04 PM           K    3.3 (L)    06/03/2024 08:41 AM           K    3.2 (L)    05/13/2024 12:13 PM           K    3.0 (L)    04/29/2024 11:07 AM           K    3.3 (L)    04/15/2024 07:42 AM           Hypomagnesemia 12/20/2020   Lab Results      Component    Value    Date/Time           MG    1.8    06/24/2024 08:34 AM           Loose, teeth 08/06/2022  Malocclusion 08/06/2022   Microalbuminuria 08/19/2022   Periodontal disease 08/06/2022   Peripheral neuropathy 09/08/2022   Started 08/2022 A/w diabetes and revlimid  usage Burning on top of left foot   Teeth missing 08/06/2022    SURGICAL  HISTORY: Past Surgical History:  Procedure Laterality Date   APPENDECTOMY     Mathhew BONE MARROW BIOPSY & ASPIRATION  02/11/2023   Aurelio FLUORO GUIDE CV LINE RIGHT  05/15/2022   [facility]ard PATIENT EVAL TECH 0-60 MINS  05/19/2022   Lebert US  GUIDE VASC ACCESS RIGHT  05/15/2022    SOCIAL HISTORY: Social History   Socioeconomic History   Marital status: Married    Spouse name: Not on file   Number of children: Not on file   Years of education: Not on file   Highest education level: Not on file  Occupational History   Not on file  Tobacco Use   Smoking status: Never   Smokeless tobacco: Never  Vaping Use   Vaping status: Never Used  Substance and Sexual Activity   Alcohol use: Never   Drug use: Never   Sexual activity: Not on file  Other Topics Concern   Not on file  Social History Narrative   Not on file   Social Drivers of Health   Financial Resource Strain: Medium Risk (05/21/2022)   Overall Financial Resource Strain (CARDIA)    Difficulty of Paying Living Expenses: Somewhat hard  Food Insecurity: Food Insecurity Present (05/21/2022)   Hunger Vital Sign    Worried About Running Out of Food in the Last Year: Sometimes true    Ran Out of Food in the Last Year: Sometimes true  Transportation Needs: No Transportation Needs (05/21/2022)   PRAPARE - Administrator, Civil Service (Medical): No    Lack of Transportation (Non-Medical): No  Physical Activity: Not on file  Stress: Not on file  Social Connections: Not on file  Intimate Partner Violence: Not on file    FAMILY HISTORY: No family history on file.  ALLERGIES:  has no known allergies.  MEDICATIONS:  . Allergies as of 10/18/2024   No Known Allergies      Medication List        Accurate as of October 18, 2024 10:58 AM. If you have any questions, ask your nurse or doctor.          Accu-Chek Guide test strip Generic drug: glucose blood Use to test blood sugars up to 4 times daily as directed.   Accu-Chek  Guide w/Device Kit Use to test blood sugars up to 4 times daily as directed.   Accu-Chek Softclix Lancets lancets Use to test blood sugars up to 4 times daily as directed.   acyclovir  400 MG tablet Commonly known as: ZOVIRAX  Take 1 tablet (400 mg total) by mouth 2 (two) times daily.   aspirin  EC 81 MG tablet Take 1 tablet (81 mg total) by mouth daily. Swallow whole.   B-12 1000 MCG Caps Take 1 tablet by mouth daily.   ciclopirox  0.77 % cream Commonly known as: LOPROX  Apply topically 2 (two) times daily.   dapagliflozin  propanediol 10 MG Tabs tablet Commonly known as: Farxiga  Take 1 tablet (10 mg total) by mouth daily before breakfast. sa asar sa hroi   ergocalciferol  1.25 MG (50000 UT) capsule Commonly known as: VITAMIN D2 Take 1 capsule (50,000 Units total) by mouth once a week.   FreeStyle Libre 3 Plus Sensor Misc Change sensor every 15 days.   Kerendia  10 MG  Tabs Generic drug: Finerenone  Take 1 tablet (10 mg total) by mouth daily at 12 noon.   lenalidomide  25 MG capsule Commonly known as: REVLIMID  TAKE 1 CAPSULE BY MOUTH 1 TIME A DAY FOR 21 DAYS ON THEN 7 DAYS OFF.   losartan  25 MG tablet Commonly known as: COZAAR  Take 1 tablet (25 mg total) by mouth daily. Start taking if blood pressure is over 130/80.  Protects kidneys and lowers blood pressure.   metFORMIN  500 MG tablet Commonly known as: GLUCOPHAGE  Take 2 tablets (1,000 mg total) by mouth 2 (two) times daily with a meal. 2 asar 1 wot, 2 wot mnhum  amang 1 hroi   potassium chloride  SA 20 MEQ tablet Commonly known as: KLOR-CON  M Take 1 tablet (20 mEq total) by mouth daily.   rosuvastatin  10 MG tablet Commonly known as: CRESTOR  Take 1 tablet (10 mg total) by mouth daily.   tirzepatide  15 MG/0.5ML Pen Commonly known as: MOUNJARO  Inject 15 mg into the skin once a week. Start after 4 weeks on 12.5 mg dose.   Vitamin D3 125 MCG (5000 UT) Caps Take 1 capsule (5,000 Units total) by mouth daily.   Vitamin K2   100 MCG Caps Take 1 capsule by mouth daily at 6 (six) AM.         REVIEW OF SYSTEMS:   10 Point review of Systems was done is negative except as noted above.  PHYSICAL EXAMINATION:  Today's Vitals   10/18/24 1010  BP: 129/84  Pulse: 60  Resp: 20  Temp: (!) 97.3 F (36.3 C)  SpO2: 99%  Weight: 169 lb 11.2 oz (77 kg)   Wt Readings from Last 1 Encounters:  10/18/24 169 lb 11.2 oz (77 kg)  body mass index is 27.39 kg/m. Vital signs stable  GENERAL: alert, in no acute distress and comfortable SKIN: no acute rashes, no significant lesions EYES: conjunctiva are pink and non-injected, sclera anicteric OROPHARYNX: MMM, no exudates, no oropharyngeal erythema or ulceration NECK: supple, no JVD LYMPH: no palpable lymphadenopathy in the cervical, axillary or inguinal regions LUNGS: clear to auscultation b/l with normal respiratory effort HEART: regular rate & rhythm ABDOMEN: normoactive bowel sounds , non tender, not distended. Extremity: no pedal edema PSYCH: alert & oriented x 3 with fluent speech NEURO: no focal motor/sensory deficits   LABORATORY DATA:  I have reviewed the data as listed  .    Latest Ref Rng & Units 10/18/2024    8:50 AM 10/06/2024   10:21 AM 10/04/2024   11:57 AM  CBC  WBC 4.0 - 10.5 K/uL 10.2  7.7  8.7   Hemoglobin 13.0 - 17.0 g/dL 82.9  84.2  84.5   Hematocrit 39.0 - 52.0 % 50.3  48.8  44.8   Platelets 150 - 400 K/uL 193  174.0  161        Latest Ref Rng & Units 10/18/2024    8:50 AM 10/06/2024   10:21 AM 10/04/2024   11:57 AM  CMP  Glucose 70 - 99 mg/dL 884  867  848   BUN 6 - 20 mg/dL 13  18  12    Creatinine 0.61 - 1.24 mg/dL 9.30  9.26  9.38   Sodium 135 - 145 mmol/L 139  138  139   Potassium 3.5 - 5.1 mmol/L 3.7  3.6  3.9   Chloride 98 - 111 mmol/L 106  103  109   CO2 22 - 32 mmol/L 25  26  26    Calcium  8.9 -  10.3 mg/dL 9.4  8.9  8.7   Total Protein 6.5 - 8.1 g/dL 6.9  6.5  6.2   Total Bilirubin 0.0 - 1.2 mg/dL 0.8  0.8  0.6    Alkaline Phos 38 - 126 U/L 53  50  51   AST 15 - 41 U/L 15  14  11    ALT 0 - 44 U/L 15  17  18     MULTIPLE MYELOMA PANEL 06/2024 - 10/18/2024    LDH: Lab Results  Component Value Date   LDH 114 10/10/2022    RADIOGRAPHIC STUDIES: I have personally reviewed the radiological images as listed and agreed with the findings in the report. No results found.  ASSESSMENT & PLAN:   1) R-ISS stage III high risk IgG Lambda Multiple myeloma not on treatment for more than 1 year due to patient's lack of follow-up. -Patient diagnosed December 2021 and received 1 cycle of CyBorD. -Had previously presented with anemia renal insufficiency and hyperviscosity symptoms. -Bone marrow Bx- 90% plasma cells -Cytogenetics showed: Del(1p): Not Detected  Dup(1q): Not Detected  Gains(15): DETECTED  Gains(5 and 9): Not Detected  Del(13q)/-13: DETECTED  Del(17p)(TP53): Not Detected  IGH(Rearrangement): SEE BELOW   IgH complex: t(4;14): DETECTED  t(11;14): Not Detected  t(14;16): Not Detected  t(14;20): Not Detected   Cytogenetics: Normal male karyotype.   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  M protein stable at 0.3 g/dL of IgG lambda monoclonal protein and some IgG kappa monoclonal protein (from the daratumumab )  - No infection issues and no new focal symptoms or clinical evidence suggestive of disease progression at this time. -no notable toxicities from Dara/rev/dex  - He will continue his daratumumab  Faspro every 2 weeks and Revlimid  25 mg 3 weeks on 1 week off till progression. - has thus far declined Auto HSCT consideration  FOLLOW-UP: - RTC in 4 weeks for next toxicity check - Continue treatment as per integrated scheduling  The total time spent in the appointment was 30 minutes*.  All of the  patient's questions were answered with apparent satisfaction. The patient knows to call the clinic with any problems, questions or concerns.   Emaline Saran MD MS AAHIVMS [facility] Hematology/Oncology Physician [facility]  .*Total Encounter Time as defined by the Centers for Medicare and Medicaid Services includes, in addition to the face-to-face time of a patient visit (documented in the note above) non-face-to-face time: obtaining and reviewing outside history, ordering and reviewing medications, tests or procedures, care coordination (communications with other health care professionals or caregivers) and documentation in the medical record.  I,  Damien Lagle,acting as a scribe for Emaline Saran, MD.,have documented all relevant documentation on the behalf of Emaline Saran, MD,as directed by  Emaline Saran, MD while in the presence of Emaline Saran, MD.  I have reviewed the above documentation for accuracy and completeness, and I agree with the above. Emaline Candida Saran MD.

## 2024-10-21 ENCOUNTER — Other Ambulatory Visit: Payer: Self-pay | Admitting: Hematology

## 2024-10-21 DIAGNOSIS — C9000 Multiple myeloma not having achieved remission: Secondary | ICD-10-CM

## 2024-10-23 LAB — ALDOSTERONE + RENIN ACTIVITY W/ RATIO
ALDO / PRA Ratio: 4.4 ratio (ref 0.9–28.9)
Aldosterone: 6 ng/dL
Renin Activity: 1.37 ng/mL/h (ref 0.25–5.82)

## 2024-10-24 ENCOUNTER — Encounter: Payer: Self-pay | Admitting: Hematology

## 2024-10-24 LAB — MULTIPLE MYELOMA PANEL, SERUM
Albumin SerPl Elph-Mcnc: 4 g/dL (ref 2.9–4.4)
Albumin/Glob SerPl: 1.3 (ref 0.7–1.7)
Alpha 1: 0.4 g/dL (ref 0.0–0.4)
Alpha2 Glob SerPl Elph-Mcnc: 0.9 g/dL (ref 0.4–1.0)
B-Globulin SerPl Elph-Mcnc: 1.1 g/dL (ref 0.7–1.3)
Gamma Glob SerPl Elph-Mcnc: 0.7 g/dL (ref 0.4–1.8)
Globulin, Total: 3.1 g/dL (ref 2.2–3.9)
IgA: 128 mg/dL (ref 90–386)
IgG (Immunoglobin G), Serum: 725 mg/dL (ref 603–1613)
IgM (Immunoglobulin M), Srm: 40 mg/dL (ref 20–172)
M Protein SerPl Elph-Mcnc: 0.4 g/dL — ABNORMAL HIGH
Total Protein ELP: 7.1 g/dL (ref 6.0–8.5)

## 2024-10-25 ENCOUNTER — Encounter: Payer: Self-pay | Admitting: Hematology

## 2024-11-01 ENCOUNTER — Encounter: Payer: Self-pay | Admitting: Hematology

## 2024-11-01 ENCOUNTER — Other Ambulatory Visit: Payer: Self-pay | Admitting: Physician Assistant

## 2024-11-01 ENCOUNTER — Inpatient Hospital Stay

## 2024-11-01 ENCOUNTER — Inpatient Hospital Stay: Attending: Hematology

## 2024-11-01 VITALS — BP 119/80 | HR 78 | Temp 97.7°F | Resp 18 | Wt 171.5 lb

## 2024-11-01 DIAGNOSIS — C9001 Multiple myeloma in remission: Secondary | ICD-10-CM

## 2024-11-01 DIAGNOSIS — Z7189 Other specified counseling: Secondary | ICD-10-CM

## 2024-11-01 DIAGNOSIS — Z5112 Encounter for antineoplastic immunotherapy: Secondary | ICD-10-CM | POA: Diagnosis present

## 2024-11-01 DIAGNOSIS — C9000 Multiple myeloma not having achieved remission: Secondary | ICD-10-CM | POA: Insufficient documentation

## 2024-11-01 LAB — CBC WITH DIFFERENTIAL (CANCER CENTER ONLY)
Abs Immature Granulocytes: 0.04 K/uL (ref 0.00–0.07)
Basophils Absolute: 0.1 K/uL (ref 0.0–0.1)
Basophils Relative: 1 %
Eosinophils Absolute: 0.6 K/uL — ABNORMAL HIGH (ref 0.0–0.5)
Eosinophils Relative: 6 %
HCT: 48.2 % (ref 39.0–52.0)
Hemoglobin: 16.5 g/dL (ref 13.0–17.0)
Immature Granulocytes: 0 %
Lymphocytes Relative: 38 %
Lymphs Abs: 3.6 K/uL (ref 0.7–4.0)
MCH: 27.1 pg (ref 26.0–34.0)
MCHC: 34.2 g/dL (ref 30.0–36.0)
MCV: 79.3 fL — ABNORMAL LOW (ref 80.0–100.0)
Monocytes Absolute: 0.8 K/uL (ref 0.1–1.0)
Monocytes Relative: 8 %
Neutro Abs: 4.4 K/uL (ref 1.7–7.7)
Neutrophils Relative %: 47 %
Platelet Count: 183 K/uL (ref 150–400)
RBC: 6.08 MIL/uL — ABNORMAL HIGH (ref 4.22–5.81)
RDW: 14.6 % (ref 11.5–15.5)
WBC Count: 9.5 K/uL (ref 4.0–10.5)
nRBC: 0 % (ref 0.0–0.2)

## 2024-11-01 LAB — COMPREHENSIVE METABOLIC PANEL WITH GFR
ALT: 21 U/L (ref 0–44)
AST: 16 U/L (ref 15–41)
Albumin: 3.9 g/dL (ref 3.5–5.0)
Alkaline Phosphatase: 59 U/L (ref 38–126)
Anion gap: 8 (ref 5–15)
BUN: 17 mg/dL (ref 6–20)
CO2: 23 mmol/L (ref 22–32)
Calcium: 8.7 mg/dL — ABNORMAL LOW (ref 8.9–10.3)
Chloride: 109 mmol/L (ref 98–111)
Creatinine, Ser: 0.86 mg/dL (ref 0.61–1.24)
GFR, Estimated: >60 mL/min (ref >60)
Glucose, Bld: 168 mg/dL — ABNORMAL HIGH (ref 70–99)
Potassium: 3.5 mmol/L (ref 3.5–5.1)
Sodium: 140 mmol/L (ref 135–145)
Total Bilirubin: 0.4 mg/dL (ref 0.0–1.2)
Total Protein: 6.2 g/dL — ABNORMAL LOW (ref 6.5–8.1)

## 2024-11-01 MED ORDER — SODIUM CHLORIDE 0.9 % IV SOLN: Freq: Once | INTRAVENOUS | Status: AC

## 2024-11-01 MED ORDER — MONTELUKAST SODIUM 10 MG PO TABS
10.0000 mg | ORAL_TABLET | Freq: Once | ORAL | Status: AC
  Administered 2024-11-01: 10 mg via ORAL
  Filled 2024-11-01: qty 1

## 2024-11-01 MED ORDER — FAMOTIDINE 20 MG PO TABS
20.0000 mg | ORAL_TABLET | Freq: Once | ORAL | Status: AC
  Administered 2024-11-01: 20 mg via ORAL
  Filled 2024-11-01: qty 1

## 2024-11-01 MED ORDER — DARATUMUMAB-HYALURONIDASE-FIHJ 1800-30000 MG-UT/15ML ~~LOC~~ SOLN
1800.0000 mg | Freq: Once | SUBCUTANEOUS | Status: AC
  Administered 2024-11-01: 1800 mg via SUBCUTANEOUS
  Filled 2024-11-01: qty 15

## 2024-11-01 MED ORDER — DIPHENHYDRAMINE HCL 25 MG PO CAPS
50.0000 mg | ORAL_CAPSULE | Freq: Once | ORAL | Status: AC
  Administered 2024-11-01: 50 mg via ORAL
  Filled 2024-11-01: qty 2

## 2024-11-01 MED ORDER — ACETAMINOPHEN 325 MG PO TABS
650.0000 mg | ORAL_TABLET | Freq: Once | ORAL | Status: AC
  Administered 2024-11-01: 650 mg via ORAL
  Filled 2024-11-01: qty 2

## 2024-11-01 MED ORDER — ZOLEDRONIC ACID 4 MG/100ML IV SOLN
4.0000 mg | Freq: Once | INTRAVENOUS | Status: AC
  Administered 2024-11-01: 4 mg via INTRAVENOUS
  Filled 2024-11-01: qty 100

## 2024-11-01 MED ORDER — DEXAMETHASONE 4 MG PO TABS
2.0000 mg | ORAL_TABLET | Freq: Once | ORAL | Status: AC
  Administered 2024-11-01: 2 mg via ORAL
  Filled 2024-11-01: qty 1

## 2024-11-01 NOTE — Progress Notes (Signed)
 Ok to proceed with Zometa  today with CCa 8.8.  Bridgett Leach [place], [place], BCPS, BCOP 11/01/2024 1:31 PM

## 2024-11-01 NOTE — Progress Notes (Signed)
 Pt given Revlimid  on 11/01/24. It is delivered to the [facility]. Pt to start taking this cycle on 11/02/24 - 11/22/24. Pt will take off 11/23/24 - 12/2 /25. New cycle will start on 11/30/24. Pt aware when to start this medication.

## 2024-11-01 NOTE — Patient Instructions (Addendum)
 [facility] - A DEPT OF [place]  Discharge Instructions: Thank you for choosing [facility] to provide your oncology and hematology care.   If you have a lab appointment with the Cancer Center, please go directly to the Cancer Center and check in at the registration area.   Wear comfortable clothing and clothing appropriate for easy access to any Portacath or PICC line.   We strive to give you quality time with your provider. You may need to reschedule your appointment if you arrive late (15 or more minutes).  Arriving late affects you and other patients whose appointments are after yours.  Also, if you miss three or more appointments without notifying the office, you may be dismissed from the clinic at the provider's discretion.      For prescription refill requests, have your pharmacy contact our office and allow 72 hours for refills to be completed.    Today you received the following chemotherapy and/or immunotherapy agents: Daratumumab -hyaluronidase -fihj (Darzalex  faspro)    To help prevent nausea and vomiting after your treatment, we encourage you to take your nausea medication as directed.  BELOW ARE SYMPTOMS THAT SHOULD BE REPORTED IMMEDIATELY: *FEVER GREATER THAN 100.4 F (38 C) OR HIGHER *CHILLS OR SWEATING *NAUSEA AND VOMITING THAT IS NOT CONTROLLED WITH YOUR NAUSEA MEDICATION *UNUSUAL SHORTNESS OF BREATH *UNUSUAL BRUISING OR BLEEDING *URINARY PROBLEMS (pain or burning when urinating, or frequent urination) *BOWEL PROBLEMS (unusual diarrhea, constipation, pain near the anus) TENDERNESS IN MOUTH AND THROAT WITH OR WITHOUT PRESENCE OF ULCERS (sore throat, sores in mouth, or a toothache) UNUSUAL RASH, SWELLING OR PAIN  UNUSUAL VAGINAL DISCHARGE OR ITCHING   Items with * indicate a potential emergency and should be followed up as soon as possible or go to the Emergency Department if any problems should occur.  Please show the  CHEMOTHERAPY ALERT CARD or IMMUNOTHERAPY ALERT CARD at check-in to the Emergency Department and triage nurse.  Should you have questions after your visit or need to cancel or reschedule your appointment, please contact [facility] DE[facility]  Dept: [phone]  and follow the prompts.  Office hours are 8:00 a.m. to 4:30 p.m. Monday - Friday. Please note that voicemails left after 4:00 p.m. may not be returned until the following business day.  We are closed weekends and major holidays. You have access to a nurse at all times for urgent questions. Please call the main number to the clinic Dept: [phone] and follow the prompts.   For any non-urgent questions, you may also contact your provider using MyChart. We now offer e-Visits for anyone 59 and older to request care online for non-urgent symptoms. For details visit [email or web address].   Also download the MyChart app! Go to the app store, search MyChart, open the app, select [place], and log in with your MyChart username and password.  Zoledronic  Acid Injection (Cancer) What is this medication? ZOLEDRONIC  ACID (ZOE le dron ik AS id) treats high calcium  levels in the blood caused by cancer. It may also be used with chemotherapy to treat weakened bones caused by cancer. It works by slowing down the release of calcium  from bones. This lowers calcium  levels in your blood. It also makes your bones stronger and less likely to break (fracture). It belongs to a group of medications called bisphosphonates. This medicine may be used for other purposes; ask your health care provider  or pharmacist if you have questions. COMMON BRAND NAME(S): Zometa , Zometa  Powder What should I tell my care team before I take this medication? They need to know if you have any of these conditions: Dehydration Dental disease Kidney disease Liver disease Low levels of calcium  in the blood Lung or breathing disease,  such as asthma Receiving steroids, such as dexamethasone  or prednisone An unusual or allergic reaction to zoledronic  acid, other medications, foods, dyes, or preservatives Pregnant or trying to get pregnant Breast-feeding How should I use this medication? This medication is injected into a vein. It is given by your care team in a hospital or clinic setting. Talk to your care team about the use of this medication in children. Special care may be needed. Overdosage: If you think you have taken too much of this medicine contact a poison control center or emergency room at once. NOTE: This medicine is only for you. Do not share this medicine with others. What if I miss a dose? Keep appointments for follow-up doses. It is important not to miss your dose. Call your care team if you are unable to keep an appointment. What may interact with this medication? Certain antibiotics given by injection Diuretics, such as bumetanide, furosemide NSAIDs, medications for pain and inflammation, such as ibuprofen or naproxen Teriparatide Thalidomide This list may not describe all possible interactions. Give your health care provider a list of all the medicines, herbs, non-prescription drugs, or dietary supplements you use. Also tell them if you smoke, drink alcohol, or use illegal drugs. Some items may interact with your medicine. What should I watch for while using this medication? Visit your care team for regular checks on your progress. It may be some time before you see the benefit from this medication. Some people who take this medication have severe bone, joint, or muscle pain. This medication may also increase your risk for jaw problems or a broken thigh bone. Tell your care team right away if you have severe pain in your jaw, bones, joints, or muscles. Tell you care team if you have any pain that does not go away or that gets worse. Tell your dentist and dental surgeon that you are taking this medication.  You should not have major dental surgery while on this medication. See your dentist to have a dental exam and fix any dental problems before starting this medication. Take good care of your teeth while on this medication. Make sure you see your dentist for regular follow-up appointments. You should make sure you get enough calcium  and vitamin D  while you are taking this medication. Discuss the foods you eat and the vitamins you take with your care team. Check with your care team if you have severe diarrhea, nausea, and vomiting, or if you sweat a lot. The loss of too much body fluid may make it dangerous for you to take this medication. You may need bloodwork while taking this medication. Talk to your care team if you wish to become pregnant or think you might be pregnant. This medication can cause serious birth defects. What side effects may I notice from receiving this medication? Side effects that you should report to your care team as soon as possible: Allergic reactions--skin rash, itching, hives, swelling of the face, lips, tongue, or throat Kidney injury--decrease in the amount of urine, swelling of the ankles, hands, or feet Low calcium  level--muscle pain or cramps, confusion, tingling, or numbness in the hands or feet Osteonecrosis of the jaw--pain, swelling, or redness  in the mouth, numbness of the jaw, poor healing after dental work, unusual discharge from the mouth, visible bones in the mouth Severe bone, joint, or muscle pain Side effects that usually do not require medical attention (report to your care team if they continue or are bothersome): Constipation Fatigue Fever Loss of appetite Nausea Stomach pain This list may not describe all possible side effects. Call your doctor for medical advice about side effects. You may report side effects to FDA at 1-800-FDA-1088. Where should I keep my medication? This medication is given in a hospital or clinic. It will not be stored at  home. NOTE: This sheet is a summary. It may not cover all possible information. If you have questions about this medicine, talk to your doctor, pharmacist, or health care provider.  2024 Elsevier/Gold Standard (2022-02-07 00:00:00)

## 2024-11-07 LAB — MULTIPLE MYELOMA PANEL, SERUM
Albumin SerPl Elph-Mcnc: 3.6 g/dL (ref 2.9–4.4)
Albumin/Glob SerPl: 1.4 (ref 0.7–1.7)
Alpha 1: 0.1 g/dL (ref 0.0–0.4)
Alpha2 Glob SerPl Elph-Mcnc: 1 g/dL (ref 0.4–1.0)
B-Globulin SerPl Elph-Mcnc: 1 g/dL (ref 0.7–1.3)
Gamma Glob SerPl Elph-Mcnc: 0.6 g/dL (ref 0.4–1.8)
Globulin, Total: 2.6 g/dL (ref 2.2–3.9)
IgA: 135 mg/dL (ref 90–386)
IgG (Immunoglobin G), Serum: 707 mg/dL (ref 603–1613)
IgM (Immunoglobulin M), Srm: 32 mg/dL (ref 20–172)
M Protein SerPl Elph-Mcnc: 0.3 g/dL — ABNORMAL HIGH
Total Protein ELP: 6.2 g/dL (ref 6.0–8.5)

## 2024-11-08 ENCOUNTER — Inpatient Hospital Stay

## 2024-11-15 ENCOUNTER — Inpatient Hospital Stay

## 2024-11-15 ENCOUNTER — Encounter: Payer: Self-pay | Admitting: Hematology

## 2024-11-15 VITALS — BP 120/74 | HR 76 | Temp 98.1°F | Resp 18 | Wt 175.0 lb

## 2024-11-15 DIAGNOSIS — Z7189 Other specified counseling: Secondary | ICD-10-CM

## 2024-11-15 DIAGNOSIS — C9001 Multiple myeloma in remission: Secondary | ICD-10-CM

## 2024-11-15 DIAGNOSIS — Z5112 Encounter for antineoplastic immunotherapy: Secondary | ICD-10-CM | POA: Diagnosis not present

## 2024-11-15 LAB — CBC WITH DIFFERENTIAL (CANCER CENTER ONLY)
Abs Immature Granulocytes: 0.03 K/uL (ref 0.00–0.07)
Basophils Absolute: 0.1 K/uL (ref 0.0–0.1)
Basophils Relative: 1 %
Eosinophils Absolute: 0.2 K/uL (ref 0.0–0.5)
Eosinophils Relative: 2 %
HCT: 45.9 % (ref 39.0–52.0)
Hemoglobin: 15.6 g/dL (ref 13.0–17.0)
Immature Granulocytes: 0 %
Lymphocytes Relative: 29 %
Lymphs Abs: 2.7 K/uL (ref 0.7–4.0)
MCH: 26.8 pg (ref 26.0–34.0)
MCHC: 34 g/dL (ref 30.0–36.0)
MCV: 78.9 fL — ABNORMAL LOW (ref 80.0–100.0)
Monocytes Absolute: 0.6 K/uL (ref 0.1–1.0)
Monocytes Relative: 7 %
Neutro Abs: 5.6 K/uL (ref 1.7–7.7)
Neutrophils Relative %: 61 %
Platelet Count: 186 K/uL (ref 150–400)
RBC: 5.82 MIL/uL — ABNORMAL HIGH (ref 4.22–5.81)
RDW: 14.7 % (ref 11.5–15.5)
WBC Count: 9.2 K/uL (ref 4.0–10.5)
nRBC: 0 % (ref 0.0–0.2)

## 2024-11-15 LAB — COMPREHENSIVE METABOLIC PANEL WITH GFR
ALT: 27 U/L (ref 0–44)
AST: 22 U/L (ref 15–41)
Albumin: 3.7 g/dL (ref 3.5–5.0)
Alkaline Phosphatase: 62 U/L (ref 38–126)
Anion gap: 12 (ref 5–15)
BUN: 16 mg/dL (ref 6–20)
CO2: 22 mmol/L (ref 22–32)
Calcium: 8.4 mg/dL — ABNORMAL LOW (ref 8.9–10.3)
Chloride: 104 mmol/L (ref 98–111)
Creatinine, Ser: 0.83 mg/dL (ref 0.61–1.24)
GFR, Estimated: >60 mL/min (ref >60)
Glucose, Bld: 199 mg/dL — ABNORMAL HIGH (ref 70–99)
Potassium: 3.6 mmol/L (ref 3.5–5.1)
Sodium: 138 mmol/L (ref 135–145)
Total Bilirubin: 0.7 mg/dL (ref 0.0–1.2)
Total Protein: 5.6 g/dL — ABNORMAL LOW (ref 6.5–8.1)

## 2024-11-15 MED ORDER — DIPHENHYDRAMINE HCL 25 MG PO CAPS
50.0000 mg | ORAL_CAPSULE | Freq: Once | ORAL | Status: AC
  Administered 2024-11-15: 50 mg via ORAL
  Filled 2024-11-15: qty 2

## 2024-11-15 MED ORDER — ACETAMINOPHEN 325 MG PO TABS
650.0000 mg | ORAL_TABLET | Freq: Once | ORAL | Status: AC
  Administered 2024-11-15: 650 mg via ORAL
  Filled 2024-11-15: qty 2

## 2024-11-15 MED ORDER — FAMOTIDINE 20 MG PO TABS
20.0000 mg | ORAL_TABLET | Freq: Once | ORAL | Status: AC
  Administered 2024-11-15: 20 mg via ORAL
  Filled 2024-11-15: qty 1

## 2024-11-15 MED ORDER — DEXAMETHASONE 4 MG PO TABS
2.0000 mg | ORAL_TABLET | Freq: Once | ORAL | Status: AC
  Administered 2024-11-15: 2 mg via ORAL
  Filled 2024-11-15: qty 1

## 2024-11-15 MED ORDER — MONTELUKAST SODIUM 10 MG PO TABS
10.0000 mg | ORAL_TABLET | Freq: Once | ORAL | Status: AC
  Administered 2024-11-15: 10 mg via ORAL
  Filled 2024-11-15: qty 1

## 2024-11-15 MED ORDER — DARATUMUMAB-HYALURONIDASE-FIHJ 1800-30000 MG-UT/15ML ~~LOC~~ SOLN
1800.0000 mg | Freq: Once | SUBCUTANEOUS | Status: AC
  Administered 2024-11-15: 1800 mg via SUBCUTANEOUS
  Filled 2024-11-15: qty 15

## 2024-11-15 NOTE — Patient Instructions (Signed)
 [facility] - A DEPT OF [place]  Discharge Instructions: Thank you for choosing [facility] to provide your oncology and hematology care.   If you have a lab appointment with the Cancer Center, please go directly to the Cancer Center and check in at the registration area.   Wear comfortable clothing and clothing appropriate for easy access to any Portacath or PICC line.   We strive to give you quality time with your provider. You may need to reschedule your appointment if you arrive late (15 or more minutes).  Arriving late affects you and other patients whose appointments are after yours.  Also, if you miss three or more appointments without notifying the office, you may be dismissed from the clinic at the provider's discretion.      For prescription refill requests, have your pharmacy contact our office and allow 72 hours for refills to be completed.    Today you received the following chemotherapy and/or immunotherapy agents: Daratumumab -hyaluronidase -fihj (Darzalex  faspro)    To help prevent nausea and vomiting after your treatment, we encourage you to take your nausea medication as directed.  BELOW ARE SYMPTOMS THAT SHOULD BE REPORTED IMMEDIATELY: *FEVER GREATER THAN 100.4 F (38 C) OR HIGHER *CHILLS OR SWEATING *NAUSEA AND VOMITING THAT IS NOT CONTROLLED WITH YOUR NAUSEA MEDICATION *UNUSUAL SHORTNESS OF BREATH *UNUSUAL BRUISING OR BLEEDING *URINARY PROBLEMS (pain or burning when urinating, or frequent urination) *BOWEL PROBLEMS (unusual diarrhea, constipation, pain near the anus) TENDERNESS IN MOUTH AND THROAT WITH OR WITHOUT PRESENCE OF ULCERS (sore throat, sores in mouth, or a toothache) UNUSUAL RASH, SWELLING OR PAIN  UNUSUAL VAGINAL DISCHARGE OR ITCHING   Items with * indicate a potential emergency and should be followed up as soon as possible or go to the Emergency Department if any problems should occur.  Please show the  CHEMOTHERAPY ALERT CARD or IMMUNOTHERAPY ALERT CARD at check-in to the Emergency Department and triage nurse.  Should you have questions after your visit or need to cancel or reschedule your appointment, please contact [facility] - [facility] DEPT [facility]  Dept: [phone]  and follow the prompts.  Office hours are 8:00 a.m. to 4:30 p.m. Monday - Friday. Please note that voicemails left after 4:00 p.m. may not be returned until the following business day.  We are closed weekends and major holidays. You have access to a nurse at all times for urgent questions. Please call the main number to the clinic Dept: [phone] and follow the prompts.   For any non-urgent questions, you may also contact your provider using MyChart. We now offer e-Visits for anyone 59 and older to request care online for non-urgent symptoms. For details visit [email or web address].   Also download the MyChart app! Go to the app store, search MyChart, open the app, select [place], and log in with your MyChart username and password.  Zoledronic  Acid Injection (Cancer) What is this medication? ZOLEDRONIC  ACID (ZOE le dron ik AS id) treats high calcium  levels in the blood caused by cancer. It may also be used with chemotherapy to treat weakened bones caused by cancer. It works by slowing down the release of calcium  from bones. This lowers calcium  levels in your blood. It also makes your bones stronger and less likely to break (fracture). It belongs to a group of medications called bisphosphonates. This medicine may be used for other purposes; ask your health care provider  or pharmacist if you have questions. COMMON BRAND NAME(S): Zometa , Zometa  Powder What should I tell my care team before I take this medication? They need to know if you have any of these conditions: Dehydration Dental disease Kidney disease Liver disease Low levels of calcium  in the blood Lung or breathing disease,  such as asthma Receiving steroids, such as dexamethasone  or prednisone An unusual or allergic reaction to zoledronic  acid, other medications, foods, dyes, or preservatives Pregnant or trying to get pregnant Breast-feeding How should I use this medication? This medication is injected into a vein. It is given by your care team in a hospital or clinic setting. Talk to your care team about the use of this medication in children. Special care may be needed. Overdosage: If you think you have taken too much of this medicine contact a poison control center or emergency room at once. NOTE: This medicine is only for you. Do not share this medicine with others. What if I miss a dose? Keep appointments for follow-up doses. It is important not to miss your dose. Call your care team if you are unable to keep an appointment. What may interact with this medication? Certain antibiotics given by injection Diuretics, such as bumetanide, furosemide NSAIDs, medications for pain and inflammation, such as ibuprofen or naproxen Teriparatide Thalidomide This list may not describe all possible interactions. Give your health care provider a list of all the medicines, herbs, non-prescription drugs, or dietary supplements you use. Also tell them if you smoke, drink alcohol, or use illegal drugs. Some items may interact with your medicine. What should I watch for while using this medication? Visit your care team for regular checks on your progress. It may be some time before you see the benefit from this medication. Some people who take this medication have severe bone, joint, or muscle pain. This medication may also increase your risk for jaw problems or a broken thigh bone. Tell your care team right away if you have severe pain in your jaw, bones, joints, or muscles. Tell you care team if you have any pain that does not go away or that gets worse. Tell your dentist and dental surgeon that you are taking this medication.  You should not have major dental surgery while on this medication. See your dentist to have a dental exam and fix any dental problems before starting this medication. Take good care of your teeth while on this medication. Make sure you see your dentist for regular follow-up appointments. You should make sure you get enough calcium  and vitamin D  while you are taking this medication. Discuss the foods you eat and the vitamins you take with your care team. Check with your care team if you have severe diarrhea, nausea, and vomiting, or if you sweat a lot. The loss of too much body fluid may make it dangerous for you to take this medication. You may need bloodwork while taking this medication. Talk to your care team if you wish to become pregnant or think you might be pregnant. This medication can cause serious birth defects. What side effects may I notice from receiving this medication? Side effects that you should report to your care team as soon as possible: Allergic reactions--skin rash, itching, hives, swelling of the face, lips, tongue, or throat Kidney injury--decrease in the amount of urine, swelling of the ankles, hands, or feet Low calcium  level--muscle pain or cramps, confusion, tingling, or numbness in the hands or feet Osteonecrosis of the jaw--pain, swelling, or redness  in the mouth, numbness of the jaw, poor healing after dental work, unusual discharge from the mouth, visible bones in the mouth Severe bone, joint, or muscle pain Side effects that usually do not require medical attention (report to your care team if they continue or are bothersome): Constipation Fatigue Fever Loss of appetite Nausea Stomach pain This list may not describe all possible side effects. Call your doctor for medical advice about side effects. You may report side effects to FDA at 1-800-FDA-1088. Where should I keep my medication? This medication is given in a hospital or clinic. It will not be stored at  home. NOTE: This sheet is a summary. It may not cover all possible information. If you have questions about this medicine, talk to your doctor, pharmacist, or health care provider.  2024 Elsevier/Gold Standard (2022-02-07 00:00:00)

## 2024-11-17 ENCOUNTER — Other Ambulatory Visit: Payer: Self-pay

## 2024-11-17 LAB — MULTIPLE MYELOMA PANEL, SERUM
Albumin SerPl Elph-Mcnc: 3.2 g/dL (ref 2.9–4.4)
Albumin/Glob SerPl: 1.4 (ref 0.7–1.7)
Alpha 1: 0.1 g/dL (ref 0.0–0.4)
Alpha2 Glob SerPl Elph-Mcnc: 0.8 g/dL (ref 0.4–1.0)
B-Globulin SerPl Elph-Mcnc: 0.8 g/dL (ref 0.7–1.3)
Gamma Glob SerPl Elph-Mcnc: 0.6 g/dL (ref 0.4–1.8)
Globulin, Total: 2.4 g/dL (ref 2.2–3.9)
IgA: 106 mg/dL (ref 90–386)
IgG (Immunoglobin G), Serum: 570 mg/dL — ABNORMAL LOW (ref 603–1613)
IgM (Immunoglobulin M), Srm: 26 mg/dL (ref 20–172)
M Protein SerPl Elph-Mcnc: 0.3 g/dL — ABNORMAL HIGH
Total Protein ELP: 5.6 g/dL — ABNORMAL LOW (ref 6.0–8.5)

## 2024-11-21 ENCOUNTER — Other Ambulatory Visit: Payer: Self-pay | Admitting: Hematology

## 2024-11-21 DIAGNOSIS — C9000 Multiple myeloma not having achieved remission: Secondary | ICD-10-CM

## 2024-11-22 ENCOUNTER — Encounter: Payer: Self-pay | Admitting: Hematology

## 2024-11-26 ENCOUNTER — Other Ambulatory Visit: Payer: Self-pay

## 2024-11-28 ENCOUNTER — Other Ambulatory Visit: Payer: Self-pay

## 2024-11-29 ENCOUNTER — Inpatient Hospital Stay: Attending: Hematology | Admitting: Hematology

## 2024-11-29 ENCOUNTER — Inpatient Hospital Stay

## 2024-11-29 ENCOUNTER — Inpatient Hospital Stay: Attending: Hematology

## 2024-11-29 VITALS — BP 131/86 | HR 100 | Temp 98.8°F | Resp 17 | Ht 66.0 in | Wt 174.0 lb

## 2024-11-29 DIAGNOSIS — Z79899 Other long term (current) drug therapy: Secondary | ICD-10-CM | POA: Diagnosis not present

## 2024-11-29 DIAGNOSIS — E876 Hypokalemia: Secondary | ICD-10-CM | POA: Diagnosis not present

## 2024-11-29 DIAGNOSIS — E1142 Type 2 diabetes mellitus with diabetic polyneuropathy: Secondary | ICD-10-CM | POA: Diagnosis not present

## 2024-11-29 DIAGNOSIS — Z5111 Encounter for antineoplastic chemotherapy: Secondary | ICD-10-CM

## 2024-11-29 DIAGNOSIS — Z7189 Other specified counseling: Secondary | ICD-10-CM

## 2024-11-29 DIAGNOSIS — E1129 Type 2 diabetes mellitus with other diabetic kidney complication: Secondary | ICD-10-CM | POA: Diagnosis not present

## 2024-11-29 DIAGNOSIS — Z5112 Encounter for antineoplastic immunotherapy: Secondary | ICD-10-CM | POA: Diagnosis present

## 2024-11-29 DIAGNOSIS — Z7985 Long-term (current) use of injectable non-insulin antidiabetic drugs: Secondary | ICD-10-CM | POA: Insufficient documentation

## 2024-11-29 DIAGNOSIS — Z79624 Long term (current) use of inhibitors of nucleotide synthesis: Secondary | ICD-10-CM | POA: Insufficient documentation

## 2024-11-29 DIAGNOSIS — K056 Periodontal disease, unspecified: Secondary | ICD-10-CM | POA: Insufficient documentation

## 2024-11-29 DIAGNOSIS — C9001 Multiple myeloma in remission: Secondary | ICD-10-CM | POA: Diagnosis not present

## 2024-11-29 DIAGNOSIS — E8809 Other disorders of plasma-protein metabolism, not elsewhere classified: Secondary | ICD-10-CM | POA: Insufficient documentation

## 2024-11-29 DIAGNOSIS — Z7984 Long term (current) use of oral hypoglycemic drugs: Secondary | ICD-10-CM | POA: Diagnosis not present

## 2024-11-29 DIAGNOSIS — Z7982 Long term (current) use of aspirin: Secondary | ICD-10-CM | POA: Diagnosis not present

## 2024-11-29 DIAGNOSIS — R808 Other proteinuria: Secondary | ICD-10-CM | POA: Insufficient documentation

## 2024-11-29 LAB — COMPREHENSIVE METABOLIC PANEL WITH GFR
ALT: 39 U/L (ref 0–44)
AST: 31 U/L (ref 15–41)
Albumin: 3.8 g/dL (ref 3.5–5.0)
Alkaline Phosphatase: 83 U/L (ref 38–126)
Anion gap: 12 (ref 5–15)
BUN: 11 mg/dL (ref 6–20)
CO2: 22 mmol/L (ref 22–32)
Calcium: 8.6 mg/dL — ABNORMAL LOW (ref 8.9–10.3)
Chloride: 102 mmol/L (ref 98–111)
Creatinine, Ser: 0.76 mg/dL (ref 0.61–1.24)
GFR, Estimated: >60 mL/min (ref >60)
Glucose, Bld: 261 mg/dL — ABNORMAL HIGH (ref 70–99)
Potassium: 3.7 mmol/L (ref 3.5–5.1)
Sodium: 136 mmol/L (ref 135–145)
Total Bilirubin: 0.8 mg/dL (ref 0.0–1.2)
Total Protein: 6.4 g/dL — ABNORMAL LOW (ref 6.5–8.1)

## 2024-11-29 LAB — CBC WITH DIFFERENTIAL (CANCER CENTER ONLY)
Abs Immature Granulocytes: 0.03 K/uL (ref 0.00–0.07)
Basophils Absolute: 0.1 K/uL (ref 0.0–0.1)
Basophils Relative: 1 %
Eosinophils Absolute: 0.4 K/uL (ref 0.0–0.5)
Eosinophils Relative: 5 %
HCT: 49.9 % (ref 39.0–52.0)
Hemoglobin: 16.7 g/dL (ref 13.0–17.0)
Immature Granulocytes: 0 %
Lymphocytes Relative: 35 %
Lymphs Abs: 2.7 K/uL (ref 0.7–4.0)
MCH: 26.1 pg (ref 26.0–34.0)
MCHC: 33.5 g/dL (ref 30.0–36.0)
MCV: 78.1 fL — ABNORMAL LOW (ref 80.0–100.0)
Monocytes Absolute: 0.8 K/uL (ref 0.1–1.0)
Monocytes Relative: 10 %
Neutro Abs: 3.7 K/uL (ref 1.7–7.7)
Neutrophils Relative %: 49 %
Platelet Count: 169 K/uL (ref 150–400)
RBC: 6.39 MIL/uL — ABNORMAL HIGH (ref 4.22–5.81)
RDW: 15.4 % (ref 11.5–15.5)
WBC Count: 7.7 K/uL (ref 4.0–10.5)
nRBC: 0 % (ref 0.0–0.2)

## 2024-11-29 MED ORDER — FAMOTIDINE 20 MG PO TABS
20.0000 mg | ORAL_TABLET | Freq: Once | ORAL | Status: AC
  Administered 2024-11-29: 20 mg via ORAL
  Filled 2024-11-29: qty 1

## 2024-11-29 MED ORDER — DEXAMETHASONE 4 MG PO TABS
2.0000 mg | ORAL_TABLET | Freq: Once | ORAL | Status: AC
  Administered 2024-11-29: 2 mg via ORAL
  Filled 2024-11-29: qty 1

## 2024-11-29 MED ORDER — DIPHENHYDRAMINE HCL 25 MG PO CAPS
50.0000 mg | ORAL_CAPSULE | Freq: Once | ORAL | Status: AC
  Administered 2024-11-29: 50 mg via ORAL
  Filled 2024-11-29: qty 2

## 2024-11-29 MED ORDER — ACETAMINOPHEN 325 MG PO TABS
650.0000 mg | ORAL_TABLET | Freq: Once | ORAL | Status: AC
  Administered 2024-11-29: 650 mg via ORAL
  Filled 2024-11-29: qty 2

## 2024-11-29 MED ORDER — MONTELUKAST SODIUM 10 MG PO TABS
10.0000 mg | ORAL_TABLET | Freq: Once | ORAL | Status: AC
  Administered 2024-11-29: 10 mg via ORAL
  Filled 2024-11-29: qty 1

## 2024-11-29 MED ORDER — DARATUMUMAB-HYALURONIDASE-FIHJ 1800-30000 MG-UT/15ML ~~LOC~~ SOLN
1800.0000 mg | Freq: Once | SUBCUTANEOUS | Status: AC
  Administered 2024-11-29: 1800 mg via SUBCUTANEOUS
  Filled 2024-11-29: qty 15

## 2024-11-29 NOTE — Progress Notes (Signed)
 Pt given Revlimid  on 11/29/24. It is delivered to the [facility]. Pt to start taking this cycle on 11/30/24 - 12/20/24. Pt will take off 12/21/24 - 12/30 /25. New cycle will start on 12/28/24. Pt aware when to start this medication.

## 2024-12-02 LAB — MULTIPLE MYELOMA PANEL, SERUM
Albumin SerPl Elph-Mcnc: 3.3 g/dL (ref 2.9–4.4)
Albumin/Glob SerPl: 1.2 (ref 0.7–1.7)
Alpha 1: 0.2 g/dL (ref 0.0–0.4)
Alpha2 Glob SerPl Elph-Mcnc: 1.1 g/dL — ABNORMAL HIGH (ref 0.4–1.0)
B-Globulin SerPl Elph-Mcnc: 0.9 g/dL (ref 0.7–1.3)
Gamma Glob SerPl Elph-Mcnc: 0.7 g/dL (ref 0.4–1.8)
Globulin, Total: 2.8 g/dL (ref 2.2–3.9)
IgA: 142 mg/dL (ref 90–386)
IgG (Immunoglobin G), Serum: 771 mg/dL (ref 603–1613)
IgM (Immunoglobulin M), Srm: 30 mg/dL (ref 20–172)
M Protein SerPl Elph-Mcnc: 0.4 g/dL — ABNORMAL HIGH
Total Protein ELP: 6.1 g/dL (ref 6.0–8.5)

## 2024-12-04 NOTE — Progress Notes (Signed)
 HEMATOLOGY ONCOLOGY PROGRESS NOTE  Date of service: 11/29/2024  Patient Care Team: Jesus Bernardino MATSU, MD as PCP - General (Internal Medicine) [name], MD as Consulting Physician (Hematology)  CHIEF COMPLAINT/PURPOSE OF CONSULTATION: Follow-up for continued evaluation and management of High-Risk Multiple Myeloma   HISTORY OF PRESENTING ILLNES: (01/01/2021) Donald Suarez is a wonderful 58 y.o. male who has been referred to us  by Dr. Agapito for evaluation and management of Multiple Myeloma. Pt is accompanied today by his brother-in-law and a Vietnamese interpreter. The pt reports that he is doing well overall.    The pt reports that he was experiencing dizziness and blurry vision prior to his diagnosis. These symptoms started two months ago. He is unable to confirm any other new symptoms at that time. The pt was at work when he became confused and was taken to the hospital. He is still unable to drive due to his blurry vision. He reports minimal improvement in his vision after plasmapheresis and has seen an Opthalmalogist. Pt was [facility] on CyBorD at [facility].    Pt denies any previous chronic medical conditions or chronic medications. The pt had an appendectomy in 2002 and denies any other surgeries. He has no known medication allergies. 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 Pt has received two COVID19 vaccines.    Of note prior to the patient's visit today, pt has had PET/CT completed on 12/26/2020 with results revealing 1. No focal osseous or soft tissue hypermetabolism to suggest metabolically active multiple myeloma. 2. Small lucencies noted within the right frontal calvarium, right T1 vertebral body and T4 vertebral body, nonspecific. Attention on follow-up imaging.   Pt has had MRI Brain completed on 12/26/2020 with results revealing 1. No definite T2 signal abnormality or pathologic enhancement along the visualized optic pathways, noting motion degradation of coronal STIR  images through the orbits. No retrobulbar mass and symmetric extraocular muscles. 2. No acute intracranial abnormality. 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 3. Questionable small faintly enhancing lesions in the right parietal and left temporal calvarium, indeterminate but favored to represent vasculature and less likely multiple myelomatous disease. Heterogeneous clivus without associated enhancement, nonspecific.   Pt has had Right Iliac Creast BM Bx completed on 12/25/2020 with results revealing 95% cellular bone marrow with greater than 90% involvement by monoclonal plasma cells showing cytoplasmic lambda light chain restriction.   Most recent lab results (12/31/2020) of CBC is as follows: all values are WNL except for  RBC at 2.70, Hgb at 8.0, HCT at 23.2, RDW at 20.2, PLT at 129K, Sodium at 132, Glucose at 138, Albumin  at 2.1, Total Protein at 11.3, AST at 60, ALT at 190. 12/31/2020 Beta 2 Microglobulin at 3.69 12/24/2020 M Spike at 8.03 g/dL 87/76/7978 IgG at 89768   On review of systems, pt reports blurry vision and denies bone pain, fatigue, loss of appetite, SOB, chest pain, headaches, dental pain, abdominal pain, leg swelling, tingling/numbness in hands/feet, pain along spine and any other symptoms.    On Social Hx the pt reports that he is non-smoker that does not drink much alcohol.    SUMMARY OF ONCOLOGIC HISTORY: Oncology History  Multiple myeloma in remission (HCC)  01/01/2021 Initial Diagnosis   Multiple myeloma not having achieved remission (HCC)   01/04/2021 - 01/11/2021 Chemotherapy   Patient is on Treatment Plan : MYELOMA NEWLY DIAGNOSED TRANSPLANT CANDIDATE Cyclophosphamide  IV + Bortezomib  SQ + Dexamethasone  (CyBorD) q21d x 4 cycles     05/21/2022 -  08/15/2022 Chemotherapy   Patient is on Treatment Plan : MYELOMA NEWLY DIAGNOSED TRANSPLANT CANDIDATE DaraVRd (Daratumumab  IV) q21d x 6 Cycles (Induction/Consolidation)     10/24/2022 -  Chemotherapy   Patient is  on Treatment Plan : MYELOMA NEWLY DIAGNOSED Daratumumab  IV + Lenalidomide  + Dexamethasone  Weekly (DaraRd) q28d     02/11/2023 Cancer Staging   Staging form: Plasma Cell Myeloma and Plasma Cell Disorders, AJCC 8th Edition - Clinical stage from 02/11/2023: High-risk cytogenetics: Present - Signed by [name], MD on 11/20/2023 Stage prefix: Initial diagnosis Cytogenetics: t(4;14) translocation    Current treatment - Daratumumab  + Revlimid  + Dexamethasone   INTERVAL HISTORY: Donald Suarez is a 58 y.o. male who is here today for continued evaluation and management of High-Risk Multiple Myeloma.   he was last seen by me on 10/18/2024; at the time he mentioned experiencing some vision issues.   Today, he       REVIEW OF SYSTEMS:   10 Point review of systems of done and is negative except as noted above.  MEDICAL HISTORY Past Medical History:  Diagnosis Date   Abfraction 08/06/2022   Accretions on teeth 08/06/2022   Anemia    Attrition, teeth excessive 08/06/2022   Blurry vision 12/20/2020   yet to see ophthalmology due to language barrier  Blurry vision (ICD-10: H53.8) Potentially related to poor glycemic control Plan: Refer to ophthalmology for diabetic retinopathy screening.     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   Managed by oncology  On revlimid      Hypoalbuminemia 05/13/2022   Lab Results      Component    Value    Date           ALBUMIN     3.7    07/19/2024           ALBUMIN     3.8    07/05/2024           ALBUMIN     3.8    06/10/2024           ALBUMIN     3.7    06/03/2024           ALBUMIN     3.7    05/13/2024           ALBUMIN     4.0    04/29/2024           ALBUMIN     3.9    04/15/2024           ALBUMIN     4.0    03/25/2024           ALBUMIN     4.2    03/11/2024         Hypocalcemia 08/19/2022   In setting of multiple myeloma    Hypokalemia 03/09/2023   Lab Results      Component     Value    Date/Time           K    3.7    07/19/2024 07:46 AM           K    3.4 (L)    07/05/2024 09:33 AM           K    3.1 (L)    06/10/2024 12:04 PM           K    3.3 (L)    06/03/2024 08:41 AM  K    3.2 (L)    05/13/2024 12:13 PM           K    3.0 (L)    04/29/2024 11:07 AM           K    3.3 (L)    04/15/2024 07:42 AM           Hypomagnesemia 12/20/2020   Lab Results      Component    Value    Date/Time           MG    1.8    06/24/2024 08:34 AM           Loose, teeth 08/06/2022   Malocclusion 08/06/2022   Microalbuminuria 08/19/2022   Periodontal disease 08/06/2022   Peripheral neuropathy 09/08/2022   Started 08/2022 A/w diabetes and revlimid  usage Burning on top of left foot   Teeth missing 08/06/2022    SURGICAL HISTORY Past Surgical History:  Procedure Laterality Date   APPENDECTOMY     [place] BONE MARROW BIOPSY & ASPIRATION  02/11/2023   [place] FLUORO GUIDE CV LINE RIGHT  05/15/2022   [place] PATIENT EVAL TECH 0-60 MINS  05/19/2022   [place] US  GUIDE VASC ACCESS RIGHT  05/15/2022    SOCIAL HISTORY Social History   Tobacco Use   Smoking status: Never   Smokeless tobacco: Never  Vaping Use   Vaping status: Never Used  Substance Use Topics   Alcohol use: Never   Drug use: Never    Social History   Social History Narrative   Not on file    SOCIAL DRIVERS OF HEALTH SDOH Screenings   Food Insecurity: Food Insecurity Present (05/21/2022)  Transportation Needs: No Transportation Needs (05/21/2022)  Depression (PHQ2-9): Low Risk  (10/18/2024)  Financial Resource Strain: Medium Risk (05/21/2022)  Tobacco Use: Low Risk  (10/06/2024)     FAMILY HISTORY No family history on file.   ALLERGIES: has no known allergies.  MEDICATIONS  Current Outpatient Medications  Medication Sig Dispense Refill   Accu-Chek Softclix Lancets lancets Use to test blood sugars up to 4 times daily as directed. 100 each 11   acyclovir  (ZOVIRAX ) 400 MG tablet Take 1 tablet (400 mg total) by mouth 2  (two) times daily. 60 tablet 11   aspirin  EC 81 MG tablet Take 1 tablet (81 mg total) by mouth daily. Swallow whole. 90 tablet 3   Blood Glucose Monitoring Suppl (ACCU-CHEK GUIDE) w/Device KIT Use to test blood sugars up to 4 times daily as directed. 1 kit 0   Cholecalciferol (VITAMIN D3) 125 MCG (5000 UT) CAPS Take 1 capsule (5,000 Units total) by mouth daily. 90 capsule 3   ciclopirox  (LOPROX ) 0.77 % cream Apply topically 2 (two) times daily. 90 g 3   Continuous Glucose Sensor (FREESTYLE LIBRE 3 PLUS SENSOR) MISC Change sensor every 15 days. 2 each 11   Cyanocobalamin  (B-12) 1000 MCG CAPS Take 1 tablet by mouth daily. 90 capsule 3   dapagliflozin  propanediol (FARXIGA ) 10 MG TABS tablet Take 1 tablet (10 mg total) by mouth daily before breakfast. sa asar sa hroi 90 tablet 3   ergocalciferol  (VITAMIN D2) 1.25 MG (50000 UT) capsule Take 1 capsule (50,000 Units total) by mouth once a week. 12 capsule 3   Finerenone  (KERENDIA ) 10 MG TABS Take 1 tablet (10 mg total) by mouth daily at 12 noon. 90 tablet 1   glucose blood (ACCU-CHEK GUIDE) test strip Use to test blood sugars up to 4  times daily as directed. 100 each 11   lenalidomide  (REVLIMID ) 25 MG capsule TAKE 1 CAPSULE BY MOUTH 1 TIME A DAY FOR 21 DAYS ON THEN 7 DAYS OFF 21 capsule 0   losartan  (COZAAR ) 25 MG tablet Take 1 tablet (25 mg total) by mouth daily. Start taking if blood pressure is over 130/80.  Protects kidneys and lowers blood pressure. 90 tablet 3   Menaquinone-7 (VITAMIN K2 ) 100 MCG CAPS Take 1 capsule by mouth daily at 6 (six) AM. 90 capsule 3   metFORMIN  (GLUCOPHAGE ) 500 MG tablet Take 2 tablets (1,000 mg total) by mouth 2 (two) times daily with a meal. 2 asar 1 wot, 2 wot mnhum  amang 1 hroi 360 tablet 3   potassium chloride  SA (KLOR-CON  M) 20 MEQ tablet Take 1 tablet (20 mEq total) by mouth daily. 30 tablet 3   rosuvastatin  (CRESTOR ) 10 MG tablet Take 1 tablet (10 mg total) by mouth daily. 90 tablet 1   tirzepatide  (MOUNJARO ) 15  MG/0.5ML Pen Inject 15 mg into the skin once a week. Start after 4 weeks on 12.5 mg dose. 6 mL 4   Current Facility-Administered Medications  Medication Dose Route Frequency Provider Last Rate Last Admin   clotrimazole  (LOTRIMIN ) 1 % cream   Topical BID Jesus Bernardino MATSU, MD        PHYSICAL EXAMINATION: ECOG PERFORMANCE STATUS: {CHL ONC ECOG ED:[id number]} VITALS: There were no vitals filed for this visit. There were no vitals filed for this visit. There is no height or weight on file to calculate BMI.  GENERAL: alert, in no acute distress and comfortable SKIN: no acute rashes, no significant lesions EYES: conjunctiva are pink and non-injected, sclera anicteric OROPHARYNX: MMM, no exudates, no oropharyngeal erythema or ulceration NECK: supple, no JVD LYMPH:  no palpable lymphadenopathy in the cervical, axillary or inguinal regions LUNGS: clear to auscultation b/l with normal respiratory effort HEART: regular rate & rhythm ABDOMEN:  normoactive bowel sounds , non tender, not distended, no hepatosplenomegaly Extremity: no pedal edema PSYCH: alert & oriented x 3 with fluent speech NEURO: no focal motor/sensory deficits  LABORATORY DATA:   I have reviewed the data as listed     Latest Ref Rng & Units 11/29/2024   12:05 PM 11/15/2024   12:04 PM 11/01/2024   11:58 AM  CBC EXTENDED  WBC 4.0 - 10.5 K/uL 7.7  9.2  9.5   RBC 4.22 - 5.81 MIL/uL 6.39  5.82  6.08   Hemoglobin 13.0 - 17.0 g/dL 83.2  84.3  83.4   HCT 39.0 - 52.0 % 49.9  45.9  48.2   Platelets 150 - 400 K/uL 169  186  183   NEUT# 1.7 - 7.7 K/uL 3.7  5.6  4.4   Lymph# 0.7 - 4.0 K/uL 2.7  2.7  3.6       Latest Ref Rng & Units 11/29/2024   12:05 PM 11/15/2024   12:04 PM 11/01/2024   11:58 AM  CMP  Glucose 70 - 99 mg/dL 738  800  831   BUN 6 - 20 mg/dL 11  16  17    Creatinine 0.61 - 1.24 mg/dL 9.23  9.16  9.13   Sodium 135 - 145 mmol/L 136  138  140   Potassium 3.5 - 5.1 mmol/L 3.7  3.6  3.5   Chloride 98 - 111 mmol/L  102  104  109   CO2 22 - 32 mmol/L 22  22  23    Calcium  8.9 -  10.3 mg/dL 8.6  8.4  8.7   Total Protein 6.5 - 8.1 g/dL 6.4  5.6  6.2   Total Bilirubin 0.0 - 1.2 mg/dL 0.8  0.7  0.4   Alkaline Phos 38 - 126 U/L 83  62  59   AST 15 - 41 U/L 31  22  16    ALT 0 - 44 U/L 39  27  21    MULTIPLE MYELOMA PANEL 07/2024 - 11/15/2024   Immunofixation shows IgG monoclonal protein with lambda light chain  specificity.  PLEASE NOTE:  Samples from patients receiving DARZALEX (R) (daratumumab ) or  SARCLISA(R)(isatuximab-irfc) treatment can appear as an  IgG kappa and mask a complete response (CR). If this patient  is receiving these therapies, this IFE assay interference  can be removed by ordering test number [id number]-Immunofixation,  Daratumumab -Specific, Serum or [id number]-Immunofixation,  Isatuximab-Specific, Serum and submitting a new sample for  testing or by calling the lab to add this test to the current  sample.   RADIOGRAPHIC STUDIES: I have personally reviewed the radiological images as listed and agreed with the findings in the report. No results found.  ASSESSMENT & PLAN:  58 y.o. 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 -Patient diagnosed December 2021 and received 1 cycle of CyBorD. -Had previously presented with anemia renal insufficiency and hyperviscosity symptoms. -Bone marrow Bx- 90% plasma cells -Cytogenetics showed: Del(1p): Not Detected  Dup(1q): Not Detected  Gains(15): DETECTED  Gains(5 and 9): Not Detected  Del(13q)/-13: DETECTED  Del(17p)(TP53): Not Detected  IGH(Rearrangement): SEE BELOW   IgH complex: t(4;14): DETECTED  t(11;14): Not Detected  t(14;16): Not Detected  t(14;20): Not Detected   Cytogenetics: Normal male karyotype.   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 CMP with Glucose 261 increased from 199 and Calcium  8.6 increased from 8.4.  M protein 0.3 g/dL of IgG lambda monoclonal protein and some IgG kappa monoclonal protein (from the daratumumab ).  FOLLOW-UP in *** for labs and follow-up with Dr. Onesimo.  The total time spent in the appointment was *** minutes* .  All of the patient's questions were answered and the patient knows to call the clinic with any problems, questions, or concerns.  Emaline Onesimo MD MS AAHIVMS [facility] Hematology/Oncology Physician [facility]  *Total Encounter Time as defined by the Centers for Medicare and Medicaid Services includes, in addition to the face-to-face time of a patient visit (documented in the note above) non-face-to-face time: obtaining and reviewing outside history, ordering and reviewing medications, tests or procedures, care coordination (communications with other health care professionals or caregivers) and documentation in the medical record.  I,Emily Lagle,acting as a neurosurgeon for Emaline Onesimo, MD.,have documented all relevant documentation on the behalf of Emaline Onesimo, MD,as directed by  Emaline Onesimo, MD while in the presence of Emaline Onesimo, MD.  I have reviewed the above documentation for accuracy and completeness, and I agree with the above.  Toby Ayad, MD

## 2024-12-05 ENCOUNTER — Encounter: Payer: Self-pay | Admitting: Hematology

## 2024-12-06 ENCOUNTER — Inpatient Hospital Stay

## 2024-12-07 ENCOUNTER — Other Ambulatory Visit: Payer: Self-pay

## 2024-12-13 ENCOUNTER — Inpatient Hospital Stay

## 2024-12-13 VITALS — BP 139/86 | HR 72 | Temp 97.7°F | Resp 16 | Wt 177.5 lb

## 2024-12-13 DIAGNOSIS — Z7189 Other specified counseling: Secondary | ICD-10-CM

## 2024-12-13 DIAGNOSIS — C9001 Multiple myeloma in remission: Secondary | ICD-10-CM

## 2024-12-13 DIAGNOSIS — Z5112 Encounter for antineoplastic immunotherapy: Secondary | ICD-10-CM | POA: Diagnosis not present

## 2024-12-13 LAB — CBC WITH DIFFERENTIAL (CANCER CENTER ONLY)
Abs Immature Granulocytes: 0.03 K/uL (ref 0.00–0.07)
Basophils Absolute: 0.1 K/uL (ref 0.0–0.1)
Basophils Relative: 1 %
Eosinophils Absolute: 0.2 K/uL (ref 0.0–0.5)
Eosinophils Relative: 2 %
HCT: 44.3 % (ref 39.0–52.0)
Hemoglobin: 15.2 g/dL (ref 13.0–17.0)
Immature Granulocytes: 0 %
Lymphocytes Relative: 31 %
Lymphs Abs: 2.7 K/uL (ref 0.7–4.0)
MCH: 26.8 pg (ref 26.0–34.0)
MCHC: 34.3 g/dL (ref 30.0–36.0)
MCV: 78 fL — ABNORMAL LOW (ref 80.0–100.0)
Monocytes Absolute: 0.8 K/uL (ref 0.1–1.0)
Monocytes Relative: 9 %
Neutro Abs: 4.9 K/uL (ref 1.7–7.7)
Neutrophils Relative %: 57 %
Platelet Count: 192 K/uL (ref 150–400)
RBC: 5.68 MIL/uL (ref 4.22–5.81)
RDW: 14.7 % (ref 11.5–15.5)
WBC Count: 8.8 K/uL (ref 4.0–10.5)
nRBC: 0 % (ref 0.0–0.2)

## 2024-12-13 LAB — COMPREHENSIVE METABOLIC PANEL WITH GFR
ALT: 24 U/L (ref 0–44)
AST: 18 U/L (ref 15–41)
Albumin: 3.6 g/dL (ref 3.5–5.0)
Alkaline Phosphatase: 66 U/L (ref 38–126)
Anion gap: 10 (ref 5–15)
BUN: 15 mg/dL (ref 6–20)
CO2: 24 mmol/L (ref 22–32)
Calcium: 8.6 mg/dL — ABNORMAL LOW (ref 8.9–10.3)
Chloride: 105 mmol/L (ref 98–111)
Creatinine, Ser: 0.79 mg/dL (ref 0.61–1.24)
GFR, Estimated: >60 mL/min (ref >60)
Glucose, Bld: 213 mg/dL — ABNORMAL HIGH (ref 70–99)
Potassium: 3.3 mmol/L — ABNORMAL LOW (ref 3.5–5.1)
Sodium: 139 mmol/L (ref 135–145)
Total Bilirubin: 0.4 mg/dL (ref 0.0–1.2)
Total Protein: 5.8 g/dL — ABNORMAL LOW (ref 6.5–8.1)

## 2024-12-13 MED ORDER — DIPHENHYDRAMINE HCL 25 MG PO CAPS
50.0000 mg | ORAL_CAPSULE | Freq: Once | ORAL | Status: AC
  Administered 2024-12-13: 15:00:00 50 mg via ORAL
  Filled 2024-12-13: qty 2

## 2024-12-13 MED ORDER — DARATUMUMAB-HYALURONIDASE-FIHJ 1800-30000 MG-UT/15ML ~~LOC~~ SOLN
1800.0000 mg | Freq: Once | SUBCUTANEOUS | Status: AC
  Administered 2024-12-13: 16:00:00 1800 mg via SUBCUTANEOUS
  Filled 2024-12-13: qty 15

## 2024-12-13 MED ORDER — ACETAMINOPHEN 325 MG PO TABS
650.0000 mg | ORAL_TABLET | Freq: Once | ORAL | Status: AC
  Administered 2024-12-13: 15:00:00 650 mg via ORAL
  Filled 2024-12-13: qty 2

## 2024-12-13 MED ORDER — DEXAMETHASONE 4 MG PO TABS
2.0000 mg | ORAL_TABLET | Freq: Once | ORAL | Status: AC
  Administered 2024-12-13: 15:00:00 2 mg via ORAL
  Filled 2024-12-13: qty 1

## 2024-12-13 MED ORDER — FAMOTIDINE 20 MG PO TABS
20.0000 mg | ORAL_TABLET | Freq: Once | ORAL | Status: AC
  Administered 2024-12-13: 15:00:00 20 mg via ORAL
  Filled 2024-12-13: qty 1

## 2024-12-13 MED ORDER — MONTELUKAST SODIUM 10 MG PO TABS
10.0000 mg | ORAL_TABLET | Freq: Once | ORAL | Status: AC
  Administered 2024-12-13: 15:00:00 10 mg via ORAL
  Filled 2024-12-13: qty 1

## 2024-12-13 NOTE — Patient Instructions (Addendum)
 [facility] - A DEPT OF [facility]  Discharge Instructions: Thank you for choosing [facility] to provide your oncology and hematology care.   If you have a lab appointment with the Cancer Center, please go directly to the Cancer Center and check in at the registration area.   Wear comfortable clothing and clothing appropriate for easy access to any Portacath or PICC line.   We strive to give you quality time with your provider. You may need to reschedule your appointment if you arrive late (15 or more minutes).  Arriving late affects you and other patients whose appointments are after yours.  Also, if you miss three or more appointments without notifying the office, you may be dismissed from the clinic at the provider's discretion.      For prescription refill requests, have your pharmacy contact our office and allow 72 hours for refills to be completed.    Today you received the following chemotherapy and/or immunotherapy agents: Daratumumab -hyaluronidase -fihj (Darzalex  faspro)    To help prevent nausea and vomiting after your treatment, we encourage you to take your nausea medication as directed.  BELOW ARE SYMPTOMS THAT SHOULD BE REPORTED IMMEDIATELY: *FEVER GREATER THAN 100.4 F (38 C) OR HIGHER *CHILLS OR SWEATING *NAUSEA AND VOMITING THAT IS NOT CONTROLLED WITH YOUR NAUSEA MEDICATION *UNUSUAL SHORTNESS OF BREATH *UNUSUAL BRUISING OR BLEEDING *URINARY PROBLEMS (pain or burning when urinating, or frequent urination) *BOWEL PROBLEMS (unusual diarrhea, constipation, pain near the anus) TENDERNESS IN MOUTH AND THROAT WITH OR WITHOUT PRESENCE OF ULCERS (sore throat, sores in mouth, or a toothache) UNUSUAL RASH, SWELLING OR PAIN  UNUSUAL VAGINAL DISCHARGE OR ITCHING   Items with * indicate a potential emergency and should be followed up as soon as possible or go to the Emergency Department if any problems should occur.  Please show the  CHEMOTHERAPY ALERT CARD or IMMUNOTHERAPY ALERT CARD at check-in to the Emergency Department and triage nurse.  Should you have questions after your visit or need to cancel or reschedule your appointment, please contact [facility] - A DEPT OF [facility]  Dept: [phone]  and follow the prompts.  Office hours are 8:00 a.m. to 4:30 p.m. Monday - Friday. Please note that voicemails left after 4:00 p.m. may not be returned until the following business day.  We are closed weekends and major holidays. You have access to a nurse at all times for urgent questions. Please call the main number to the clinic Dept: [phone] and follow the prompts.   For any non-urgent questions, you may also contact your provider using MyChart. We now offer e-Visits for anyone 46 and older to request care online for non-urgent symptoms. For details visit [email or web address].   Also download the MyChart app! Go to the app store, search MyChart, open the app, select [place], and log in with your MyChart username and password.

## 2024-12-15 LAB — MULTIPLE MYELOMA PANEL, SERUM
Albumin SerPl Elph-Mcnc: 2.9 g/dL (ref 2.9–4.4)
Albumin/Glob SerPl: 1.5 (ref 0.7–1.7)
Alpha 1: 0.1 g/dL (ref 0.0–0.4)
Alpha2 Glob SerPl Elph-Mcnc: 0.8 g/dL (ref 0.4–1.0)
B-Globulin SerPl Elph-Mcnc: 0.7 g/dL (ref 0.7–1.3)
Gamma Glob SerPl Elph-Mcnc: 0.4 g/dL (ref 0.4–1.8)
Globulin, Total: 2 g/dL — ABNORMAL LOW (ref 2.2–3.9)
IgA: 110 mg/dL (ref 90–386)
IgG (Immunoglobin G), Serum: 566 mg/dL — ABNORMAL LOW (ref 603–1613)
IgM (Immunoglobulin M), Srm: 28 mg/dL (ref 20–172)
M Protein SerPl Elph-Mcnc: 0.3 g/dL — ABNORMAL HIGH
Total Protein ELP: 4.9 g/dL — ABNORMAL LOW (ref 6.0–8.5)

## 2024-12-19 ENCOUNTER — Other Ambulatory Visit: Payer: Self-pay | Admitting: Hematology

## 2024-12-19 DIAGNOSIS — C9000 Multiple myeloma not having achieved remission: Secondary | ICD-10-CM

## 2024-12-27 ENCOUNTER — Inpatient Hospital Stay

## 2024-12-27 ENCOUNTER — Inpatient Hospital Stay (HOSPITAL_BASED_OUTPATIENT_CLINIC_OR_DEPARTMENT_OTHER): Admitting: Physician Assistant

## 2024-12-27 VITALS — BP 141/90 | HR 77 | Temp 97.5°F | Resp 20 | Wt 179.5 lb

## 2024-12-27 DIAGNOSIS — C9001 Multiple myeloma in remission: Secondary | ICD-10-CM | POA: Diagnosis not present

## 2024-12-27 DIAGNOSIS — Z7189 Other specified counseling: Secondary | ICD-10-CM

## 2024-12-27 DIAGNOSIS — Z5112 Encounter for antineoplastic immunotherapy: Secondary | ICD-10-CM | POA: Diagnosis not present

## 2024-12-27 DIAGNOSIS — Z5111 Encounter for antineoplastic chemotherapy: Secondary | ICD-10-CM | POA: Diagnosis not present

## 2024-12-27 LAB — CBC WITH DIFFERENTIAL (CANCER CENTER ONLY)
Abs Immature Granulocytes: 0.02 K/uL (ref 0.00–0.07)
Basophils Absolute: 0.1 K/uL (ref 0.0–0.1)
Basophils Relative: 1 %
Eosinophils Absolute: 0.2 K/uL (ref 0.0–0.5)
Eosinophils Relative: 2 %
HCT: 45.4 % (ref 39.0–52.0)
Hemoglobin: 15.3 g/dL (ref 13.0–17.0)
Immature Granulocytes: 0 %
Lymphocytes Relative: 40 %
Lymphs Abs: 3.2 K/uL (ref 0.7–4.0)
MCH: 26.5 pg (ref 26.0–34.0)
MCHC: 33.7 g/dL (ref 30.0–36.0)
MCV: 78.5 fL — ABNORMAL LOW (ref 80.0–100.0)
Monocytes Absolute: 0.8 K/uL (ref 0.1–1.0)
Monocytes Relative: 10 %
Neutro Abs: 3.8 K/uL (ref 1.7–7.7)
Neutrophils Relative %: 47 %
Platelet Count: 174 K/uL (ref 150–400)
RBC: 5.78 MIL/uL (ref 4.22–5.81)
RDW: 14.8 % (ref 11.5–15.5)
WBC Count: 8.1 K/uL (ref 4.0–10.5)
nRBC: 0 % (ref 0.0–0.2)

## 2024-12-27 LAB — COMPREHENSIVE METABOLIC PANEL WITH GFR
ALT: 28 U/L (ref 0–44)
AST: 24 U/L (ref 15–41)
Albumin: 3.6 g/dL (ref 3.5–5.0)
Alkaline Phosphatase: 70 U/L (ref 38–126)
Anion gap: 10 (ref 5–15)
BUN: 14 mg/dL (ref 6–20)
CO2: 22 mmol/L (ref 22–32)
Calcium: 8.1 mg/dL — ABNORMAL LOW (ref 8.9–10.3)
Chloride: 105 mmol/L (ref 98–111)
Creatinine, Ser: 0.77 mg/dL (ref 0.61–1.24)
GFR, Estimated: >60 mL/min
Glucose, Bld: 240 mg/dL — ABNORMAL HIGH (ref 70–99)
Potassium: 3.6 mmol/L (ref 3.5–5.1)
Sodium: 138 mmol/L (ref 135–145)
Total Bilirubin: 0.5 mg/dL (ref 0.0–1.2)
Total Protein: 5.7 g/dL — ABNORMAL LOW (ref 6.5–8.1)

## 2024-12-27 MED ORDER — MONTELUKAST SODIUM 10 MG PO TABS
10.0000 mg | ORAL_TABLET | Freq: Once | ORAL | Status: AC
  Administered 2024-12-27: 10 mg via ORAL
  Filled 2024-12-27: qty 1

## 2024-12-27 MED ORDER — ACETAMINOPHEN 325 MG PO TABS
650.0000 mg | ORAL_TABLET | Freq: Once | ORAL | Status: AC
  Administered 2024-12-27: 650 mg via ORAL
  Filled 2024-12-27: qty 2

## 2024-12-27 MED ORDER — FAMOTIDINE 20 MG PO TABS
20.0000 mg | ORAL_TABLET | Freq: Once | ORAL | Status: AC
  Administered 2024-12-27: 20 mg via ORAL
  Filled 2024-12-27: qty 1

## 2024-12-27 MED ORDER — DIPHENHYDRAMINE HCL 25 MG PO CAPS
50.0000 mg | ORAL_CAPSULE | Freq: Once | ORAL | Status: AC
  Administered 2024-12-27: 50 mg via ORAL
  Filled 2024-12-27: qty 2

## 2024-12-27 MED ORDER — DARATUMUMAB-HYALURONIDASE-FIHJ 1800-30000 MG-UT/15ML ~~LOC~~ SOLN
1800.0000 mg | Freq: Once | SUBCUTANEOUS | Status: AC
  Administered 2024-12-27: 1800 mg via SUBCUTANEOUS
  Filled 2024-12-27: qty 15

## 2024-12-27 MED ORDER — DEXAMETHASONE 4 MG PO TABS
2.0000 mg | ORAL_TABLET | Freq: Once | ORAL | Status: AC
  Administered 2024-12-27: 2 mg via ORAL
  Filled 2024-12-27: qty 1

## 2024-12-27 NOTE — Progress Notes (Signed)
 Donald Suarez  HEMATOLOGY/ONCOLOGY CLINIC NOTE  Date of Service: 12/27/2024  Chief complaint - Follow-up for continued evaluation and management of high risk multiple myeloma  Current treatment-daratumumab /Revlimid /dexamethasone   INTERVAL HISTORY:  Donald Suarez is a 59 y.o. male is here with his Donald Suarez interpreter for continued evaluation and management of his multiple myeloma. He was last seen by Dr. Onesimo on 11/29/2024 He reports that he is tolerating therapy without any significant limitations.   Donald Suarez reports his energy and appetite are stable.  He is able to complete all his ADLs on his own.  He denies nausea, vomiting or bowel habit changes.  He denies easy bruising or signs of active bleeding. He denies fevers, chills, sweats, shortness of breath, chest pain or cough. He has no other complaints.    MEDICAL HISTORY:  Past Medical History:  Diagnosis Date   Abfraction 08/06/2022   Accretions on teeth 08/06/2022   Anemia    Attrition, teeth excessive 08/06/2022   Blurry vision 12/20/2020   yet to see ophthalmology due to language barrier  Blurry vision (ICD-10: H53.8) Potentially related to poor glycemic control Plan: Refer to ophthalmology for diabetic retinopathy screening.     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   Managed by oncology  On revlimid      Hypoalbuminemia 05/13/2022   Lab Results      Component    Value    Date           ALBUMIN     3.7    07/19/2024           ALBUMIN     3.8    07/05/2024           ALBUMIN     3.8    06/10/2024           ALBUMIN     3.7    06/03/2024           ALBUMIN     3.7    05/13/2024           ALBUMIN     4.0    04/29/2024           ALBUMIN     3.9    04/15/2024           ALBUMIN     4.0    03/25/2024           ALBUMIN     4.2    03/11/2024         Hypocalcemia 08/19/2022   In setting of multiple myeloma    Hypokalemia 03/09/2023   Lab Results      Component    Value     Date/Time           K    3.7    07/19/2024 07:46 AM           K    3.4 (L)    07/05/2024 09:33 AM           K    3.1 (L)    06/10/2024 12:04 PM           K    3.3 (L)    06/03/2024 08:41 AM           K    3.2 (L)    05/13/2024 12:13 PM           K    3.0 (L)    04/29/2024 11:07 AM  K    3.3 (L)    04/15/2024 07:42 AM           Hypomagnesemia 12/20/2020   Lab Results      Component    Value    Date/Time           MG    1.8    06/24/2024 08:34 AM           Loose, teeth 08/06/2022   Malocclusion 08/06/2022   Microalbuminuria 08/19/2022   Periodontal disease 08/06/2022   Peripheral neuropathy 09/08/2022   Started 08/2022 A/w diabetes and revlimid  usage Burning on top of left foot   Teeth missing 08/06/2022    SURGICAL HISTORY: Past Surgical History:  Procedure Laterality Date   APPENDECTOMY     Keyen BONE MARROW BIOPSY & ASPIRATION  02/11/2023   Aryaan FLUORO GUIDE CV LINE RIGHT  05/15/2022   Raza PATIENT EVAL TECH 0-60 MINS  05/19/2022   Robertlee US  GUIDE VASC ACCESS RIGHT  05/15/2022    SOCIAL HISTORY: Social History   Socioeconomic History   Marital status: Married    Spouse name: Not on file   Number of children: Not on file   Years of education: Not on file   Highest education level: Not on file  Occupational History   Not on file  Tobacco Use   Smoking status: Never   Smokeless tobacco: Never  Vaping Use   Vaping status: Never Used  Substance and Sexual Activity   Alcohol use: Never   Drug use: Never   Sexual activity: Not on file  Other Topics Concern   Not on file  Social History Narrative   Not on file   Social Drivers of Health   Tobacco Use: Low Risk (10/06/2024)   Patient History    Smoking Tobacco Use: Never    Smokeless Tobacco Use: Never    Passive Exposure: Not on file  Financial Resource Strain: Medium Risk (05/21/2022)   Overall Financial Resource Strain (CARDIA)    Difficulty of Paying Living Expenses: Somewhat hard  Food Insecurity: Food Insecurity  Present (05/21/2022)   Hunger Vital Sign    Worried About Running Out of Food in the Last Year: Sometimes true    Ran Out of Food in the Last Year: Sometimes true  Transportation Needs: No Transportation Needs (05/21/2022)   PRAPARE - Administrator, Civil Service (Medical): No    Lack of Transportation (Non-Medical): No  Physical Activity: Not on file  Stress: Not on file  Social Connections: Not on file  Intimate Partner Violence: Not on file  Depression (EYV7-0): Low Risk (10/18/2024)   Depression (PHQ2-9)    PHQ-2 Score: 0  Alcohol Screen: Not on file  Housing: Not on file  Utilities: Not on file  Health Literacy: Not on file    FAMILY HISTORY: No family history on file.  ALLERGIES:  has no known allergies.  MEDICATIONS:  . Allergies as of 12/27/2024   No Known Allergies      Medication List        Accurate as of December 27, 2024 11:48 AM. If you have any questions, ask your nurse or doctor.          Accu-Chek Guide test strip Generic drug: glucose blood Use to test blood sugars up to 4 times daily as directed.   Accu-Chek Guide w/Device Kit Use to test blood sugars up to 4 times daily as directed.   Accu-Chek Softclix Lancets lancets Use to  test blood sugars up to 4 times daily as directed.   acyclovir  400 MG tablet Commonly known as: ZOVIRAX  Take 1 tablet (400 mg total) by mouth 2 (two) times daily.   aspirin  EC 81 MG tablet Take 1 tablet (81 mg total) by mouth daily. Swallow whole.   B-12 1000 MCG Caps Take 1 tablet by mouth daily.   ciclopirox  0.77 % cream Commonly known as: LOPROX  Apply topically 2 (two) times daily.   dapagliflozin  propanediol 10 MG Tabs tablet Commonly known as: Farxiga  Take 1 tablet (10 mg total) by mouth daily before breakfast. sa asar sa hroi   ergocalciferol  1.25 MG (50000 UT) capsule Commonly known as: VITAMIN D2 Take 1 capsule (50,000 Units total) by mouth once a week.   FreeStyle Libre 3 Plus  Sensor Misc Change sensor every 15 days.   Kerendia  10 MG Tabs Generic drug: Finerenone  Take 1 tablet (10 mg total) by mouth daily at 12 noon.   lenalidomide  25 MG capsule Commonly known as: REVLIMID  TAKE 1 CAPSULE BY MOUTH 1 TIME A DAY FOR 21 DAYS ON THEN 7 DAYS OFF   losartan  25 MG tablet Commonly known as: COZAAR  Take 1 tablet (25 mg total) by mouth daily. Start taking if blood pressure is over 130/80.  Protects kidneys and lowers blood pressure.   metFORMIN  500 MG tablet Commonly known as: GLUCOPHAGE  Take 2 tablets (1,000 mg total) by mouth 2 (two) times daily with a meal. 2 asar 1 wot, 2 wot mnhum  amang 1 hroi   potassium chloride  SA 20 MEQ tablet Commonly known as: KLOR-CON  M Take 1 tablet (20 mEq total) by mouth daily.   rosuvastatin  10 MG tablet Commonly known as: CRESTOR  Take 1 tablet (10 mg total) by mouth daily.   tirzepatide  15 MG/0.5ML Pen Commonly known as: MOUNJARO  Inject 15 mg into the skin once a week. Start after 4 weeks on 12.5 mg dose.   Vitamin D3 125 MCG (5000 UT) Caps Take 1 capsule (5,000 Units total) by mouth daily.   Vitamin K2  100 MCG Caps Take 1 capsule by mouth daily at 6 (six) AM.         REVIEW OF SYSTEMS:   10 Point review of Systems was done is negative except as noted above.   PHYSICAL EXAMINATION: Vitals:   12/27/24 1128 12/27/24 1136  BP: (!) 150/87 (!) 141/90  Pulse: 77   Resp: 20   Temp: (!) 97.5 F (36.4 C)   SpO2: 99%    GENERAL:alert, in no acute distress and comfortable SKIN: no acute rashes, no significant lesions EYES: conjunctiva are pink and non-injected, sclera anicteric LUNGS: clear to auscultation b/l with normal respiratory effort HEART: regular rate & rhythm Extremity: no pedal edema PSYCH: alert & oriented x 3 with fluent speech NEURO: no focal motor/sensory deficits   LABORATORY DATA:  I have reviewed the data as listed  .    Latest Ref Rng & Units 12/27/2024   10:39 AM 12/13/2024    1:12 PM  11/29/2024   12:05 PM  CBC  WBC 4.0 - 10.5 K/uL 8.1  8.8  7.7   Hemoglobin 13.0 - 17.0 g/dL 84.6  84.7  83.2   Hematocrit 39.0 - 52.0 % 45.4  44.3  49.9   Platelets 150 - 400 K/uL 174  192  169     .    Latest Ref Rng & Units 12/27/2024   10:39 AM 12/13/2024    1:12 PM 11/29/2024   12:05 PM  CMP  Glucose 70 - 99 mg/dL 759  786  738   BUN 6 - 20 mg/dL 14  15  11    Creatinine 0.61 - 1.24 mg/dL 9.22  9.20  9.23   Sodium 135 - 145 mmol/L 138  139  136   Potassium 3.5 - 5.1 mmol/L 3.6  3.3  3.7   Chloride 98 - 111 mmol/L 105  105  102   CO2 22 - 32 mmol/L 22  24  22    Calcium  8.9 - 10.3 mg/dL 8.1  8.6  8.6   Total Protein 6.5 - 8.1 g/dL 5.7  5.8  6.4   Total Bilirubin 0.0 - 1.2 mg/dL 0.5  0.4  0.8   Alkaline Phos 38 - 126 U/L 70  66  83   AST 15 - 41 U/L 24  18  31    ALT 0 - 44 U/L 28  24  39    . Lab Results  Component Value Date   LDH 114 10/10/2022    RADIOGRAPHIC STUDIES: I have personally reviewed the radiological images as listed and agreed with the findings in the report. No results found.  ASSESSMENT & PLAN:   1) R-ISS stage III high risk IgG Lambda Multiple myeloma not on treatment for more than 1 year due to patient's lack of follow-up. -Patient diagnosed December 2021 and received 1 cycle of CyBorD. -Had previously presented with anemia renal insufficiency and hyperviscosity symptoms. -Bone marrow Bx- 90% plasma cells -Cytogenetics showed: Del(1p): Not Detected  Dup(1q): Not Detected  Gains(15): DETECTED  Gains(5 and 9): Not Detected  Del(13q)/-13: DETECTED  Del(17p)(TP53): Not Detected  IGH(Rearrangement): SEE BELOW   IgH complex: t(4;14): DETECTED  t(11;14): Not Detected  t(14;16): Not Detected  t(14;20): Not Detected   Cytogenetics: Normal male karyotype. -Started Dara/Rev/Dex on 10/24/2022.  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 WBC 8.1, Hgb  15.3, Plt 174, creatinine 0.77, LFTs are normal.  -Most recent myeloma labs from 12/13/2024 showed M protein overall stable at  measuring 0.3 g/dL. -Tolerating Daratumumab /Revlimid /Dex without any prohibitive toxicities.  -Proceed with treatment today without any dose modifications.  -Last Zometa  was on 11/01/2024. Next dose due next month.   Follow-up -RTC in 2 weeks for next toxicity check   All of the patient's questions were answered with apparent satisfaction. The patient knows to call the clinic with any problems, questions or concerns.  I have spent a total of 30 minutes minutes of face-to-face and non-face-to-face time, preparing to see the patient,  performing a medically appropriate examination, counseling and educating the patient, ordering tests/procedures, documenting clinical information in the electronic health record,and care coordination.   Johnston Police PA-C Dept of Hematology and Oncology [facility] Phone: [phone]

## 2024-12-27 NOTE — Progress Notes (Signed)
 Pt given Revlimid  on 12/27/24. It is delivered to the [facility]. Pt to start taking this cycle on 12/28/24 - 01/17/25. Pt will take off 01/18/25 - 1/27 /26. New cycle will start on 01/25/25. Pt aware when to start this medication.

## 2024-12-27 NOTE — Patient Instructions (Signed)

## 2024-12-30 LAB — MULTIPLE MYELOMA PANEL, SERUM
Albumin SerPl Elph-Mcnc: 2.9 g/dL (ref 2.9–4.4)
Albumin/Glob SerPl: 1.3 (ref 0.7–1.7)
Alpha 1: 0.1 g/dL (ref 0.0–0.4)
Alpha2 Glob SerPl Elph-Mcnc: 0.8 g/dL (ref 0.4–1.0)
B-Globulin SerPl Elph-Mcnc: 0.8 g/dL (ref 0.7–1.3)
Gamma Glob SerPl Elph-Mcnc: 0.6 g/dL (ref 0.4–1.8)
Globulin, Total: 2.3 g/dL (ref 2.2–3.9)
IgA: 110 mg/dL (ref 90–386)
IgG (Immunoglobin G), Serum: 577 mg/dL — ABNORMAL LOW (ref 603–1613)
IgM (Immunoglobulin M), Srm: 28 mg/dL (ref 20–172)
M Protein SerPl Elph-Mcnc: 0.3 g/dL — ABNORMAL HIGH
Total Protein ELP: 5.2 g/dL — ABNORMAL LOW (ref 6.0–8.5)

## 2025-01-05 ENCOUNTER — Other Ambulatory Visit: Payer: Self-pay

## 2025-01-10 ENCOUNTER — Inpatient Hospital Stay: Attending: Hematology

## 2025-01-10 ENCOUNTER — Inpatient Hospital Stay: Admitting: Hematology

## 2025-01-10 ENCOUNTER — Inpatient Hospital Stay

## 2025-01-10 ENCOUNTER — Telehealth: Payer: Self-pay

## 2025-01-10 ENCOUNTER — Ambulatory Visit: Admitting: Internal Medicine

## 2025-01-10 VITALS — BP 141/86 | HR 77 | Temp 98.5°F | Resp 18

## 2025-01-10 DIAGNOSIS — Z7982 Long term (current) use of aspirin: Secondary | ICD-10-CM | POA: Insufficient documentation

## 2025-01-10 DIAGNOSIS — M264 Malocclusion, unspecified: Secondary | ICD-10-CM | POA: Insufficient documentation

## 2025-01-10 DIAGNOSIS — Z5112 Encounter for antineoplastic immunotherapy: Secondary | ICD-10-CM | POA: Diagnosis present

## 2025-01-10 DIAGNOSIS — K000 Anodontia: Secondary | ICD-10-CM | POA: Insufficient documentation

## 2025-01-10 DIAGNOSIS — Z9221 Personal history of antineoplastic chemotherapy: Secondary | ICD-10-CM | POA: Insufficient documentation

## 2025-01-10 DIAGNOSIS — Z7189 Other specified counseling: Secondary | ICD-10-CM

## 2025-01-10 DIAGNOSIS — E8809 Other disorders of plasma-protein metabolism, not elsewhere classified: Secondary | ICD-10-CM | POA: Diagnosis not present

## 2025-01-10 DIAGNOSIS — C9001 Multiple myeloma in remission: Secondary | ICD-10-CM | POA: Diagnosis not present

## 2025-01-10 DIAGNOSIS — Z79899 Other long term (current) drug therapy: Secondary | ICD-10-CM | POA: Diagnosis not present

## 2025-01-10 DIAGNOSIS — Z7984 Long term (current) use of oral hypoglycemic drugs: Secondary | ICD-10-CM | POA: Insufficient documentation

## 2025-01-10 DIAGNOSIS — E876 Hypokalemia: Secondary | ICD-10-CM | POA: Insufficient documentation

## 2025-01-10 DIAGNOSIS — Z7985 Long-term (current) use of injectable non-insulin antidiabetic drugs: Secondary | ICD-10-CM | POA: Diagnosis not present

## 2025-01-10 DIAGNOSIS — R808 Other proteinuria: Secondary | ICD-10-CM | POA: Diagnosis not present

## 2025-01-10 DIAGNOSIS — R7989 Other specified abnormal findings of blood chemistry: Secondary | ICD-10-CM | POA: Diagnosis not present

## 2025-01-10 DIAGNOSIS — C9000 Multiple myeloma not having achieved remission: Secondary | ICD-10-CM | POA: Diagnosis not present

## 2025-01-10 DIAGNOSIS — E1129 Type 2 diabetes mellitus with other diabetic kidney complication: Secondary | ICD-10-CM | POA: Diagnosis not present

## 2025-01-10 DIAGNOSIS — Z8673 Personal history of transient ischemic attack (TIA), and cerebral infarction without residual deficits: Secondary | ICD-10-CM | POA: Diagnosis not present

## 2025-01-10 DIAGNOSIS — Z5111 Encounter for antineoplastic chemotherapy: Secondary | ICD-10-CM | POA: Diagnosis not present

## 2025-01-10 DIAGNOSIS — Z79620 Long term (current) use of immunosuppressive biologic: Secondary | ICD-10-CM | POA: Diagnosis not present

## 2025-01-10 DIAGNOSIS — Z79624 Long term (current) use of inhibitors of nucleotide synthesis: Secondary | ICD-10-CM | POA: Insufficient documentation

## 2025-01-10 DIAGNOSIS — K0850 Unsatisfactory restoration of tooth, unspecified: Secondary | ICD-10-CM | POA: Insufficient documentation

## 2025-01-10 LAB — COMPREHENSIVE METABOLIC PANEL WITH GFR
ALT: 34 U/L (ref 0–44)
AST: 21 U/L (ref 15–41)
Albumin: 3.7 g/dL (ref 3.5–5.0)
Alkaline Phosphatase: 74 U/L (ref 38–126)
Anion gap: 11 (ref 5–15)
BUN: 13 mg/dL (ref 6–20)
CO2: 25 mmol/L (ref 22–32)
Calcium: 8.9 mg/dL (ref 8.9–10.3)
Chloride: 104 mmol/L (ref 98–111)
Creatinine, Ser: 0.69 mg/dL (ref 0.61–1.24)
GFR, Estimated: >60 mL/min
Glucose, Bld: 172 mg/dL — ABNORMAL HIGH (ref 70–99)
Potassium: 3.9 mmol/L (ref 3.5–5.1)
Sodium: 141 mmol/L (ref 135–145)
Total Bilirubin: 0.5 mg/dL (ref 0.0–1.2)
Total Protein: 6.2 g/dL — ABNORMAL LOW (ref 6.5–8.1)

## 2025-01-10 LAB — CBC WITH DIFFERENTIAL (CANCER CENTER ONLY)
Abs Immature Granulocytes: 0.03 K/uL (ref 0.00–0.07)
Basophils Absolute: 0.1 K/uL (ref 0.0–0.1)
Basophils Relative: 1 %
Eosinophils Absolute: 0.3 K/uL (ref 0.0–0.5)
Eosinophils Relative: 3 %
HCT: 46.9 % (ref 39.0–52.0)
Hemoglobin: 16 g/dL (ref 13.0–17.0)
Immature Granulocytes: 0 %
Lymphocytes Relative: 44 %
Lymphs Abs: 4.1 K/uL — ABNORMAL HIGH (ref 0.7–4.0)
MCH: 26.6 pg (ref 26.0–34.0)
MCHC: 34.1 g/dL (ref 30.0–36.0)
MCV: 78 fL — ABNORMAL LOW (ref 80.0–100.0)
Monocytes Absolute: 1 K/uL (ref 0.1–1.0)
Monocytes Relative: 11 %
Neutro Abs: 3.9 K/uL (ref 1.7–7.7)
Neutrophils Relative %: 41 %
Platelet Count: 192 K/uL (ref 150–400)
RBC: 6.01 MIL/uL — ABNORMAL HIGH (ref 4.22–5.81)
RDW: 15.3 % (ref 11.5–15.5)
WBC Count: 9.4 K/uL (ref 4.0–10.5)
nRBC: 0 % (ref 0.0–0.2)

## 2025-01-10 MED ORDER — DIPHENHYDRAMINE HCL 25 MG PO CAPS
50.0000 mg | ORAL_CAPSULE | Freq: Once | ORAL | Status: AC
  Administered 2025-01-10: 50 mg via ORAL
  Filled 2025-01-10: qty 2

## 2025-01-10 MED ORDER — DARATUMUMAB-HYALURONIDASE-FIHJ 1800-30000 MG-UT/15ML ~~LOC~~ SOLN
1800.0000 mg | Freq: Once | SUBCUTANEOUS | Status: AC
  Administered 2025-01-10: 1800 mg via SUBCUTANEOUS
  Filled 2025-01-10: qty 15

## 2025-01-10 MED ORDER — MONTELUKAST SODIUM 10 MG PO TABS
10.0000 mg | ORAL_TABLET | Freq: Once | ORAL | Status: AC
  Administered 2025-01-10: 10 mg via ORAL
  Filled 2025-01-10: qty 1

## 2025-01-10 MED ORDER — ACETAMINOPHEN 325 MG PO TABS
650.0000 mg | ORAL_TABLET | Freq: Once | ORAL | Status: AC
  Administered 2025-01-10: 650 mg via ORAL
  Filled 2025-01-10: qty 2

## 2025-01-10 MED ORDER — FAMOTIDINE 20 MG PO TABS
20.0000 mg | ORAL_TABLET | Freq: Once | ORAL | Status: AC
  Administered 2025-01-10: 20 mg via ORAL
  Filled 2025-01-10: qty 1

## 2025-01-10 MED ORDER — DEXAMETHASONE 4 MG PO TABS
2.0000 mg | ORAL_TABLET | Freq: Once | ORAL | Status: AC
  Administered 2025-01-10: 2 mg via ORAL
  Filled 2025-01-10: qty 1

## 2025-01-10 NOTE — Telephone Encounter (Signed)
 Copied from CRM [id number]. Topic: Clinical - Medical Advice >> Jan 10, 2025 11:00 AM Terri MATSU wrote: Reason for CRM: Beth nurse at [facility] cone calling regarding patient got mixed up this morning and went to their office at 840 instead of here. Just wanted to let Dr.Morrison know what happened today.

## 2025-01-10 NOTE — Progress Notes (Signed)
 " HEMATOLOGY ONCOLOGY PROGRESS NOTE  Date of service: 01/10/2025  Patient Care Team: Jesus Bernardino MATSU, MD as PCP - General (Internal Medicine) [name], MD as Consulting Physician (Hematology)  CHIEF COMPLAINT/PURPOSE OF CONSULTATION: Follow-up for continued evaluation and management of high-risk multiple myeloma.  HISTORY OF PRESENTING ILLNESS: (01/01/2021) Donald Suarez is a wonderful 59 y.o. male who has been referred to us  by Dr. Agapito for evaluation and management of Multiple Myeloma. Pt is accompanied today by his brother-in-law and a Vietnamese interpreter. The pt reports that he is doing well overall.    The pt reports that he was experiencing dizziness and blurry vision prior to his diagnosis. These symptoms started two months ago. He is unable to confirm any other new symptoms at that time. The pt was at work when he became confused and was taken to the hospital. He is still unable to drive due to his blurry vision. He reports minimal improvement in his vision after plasmapheresis and has seen an Opthalmalogist. Pt was started on CyBorD at [facility].    Pt denies any previous chronic medical conditions or chronic medications. The pt had an appendectomy in 2002 and denies any other surgeries. He has no known medication allergies. 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 Pt has received two COVID19 vaccines.    Of note prior to the patient's visit today, pt has had PET/CT completed on 12/26/2020 with results revealing 1. No focal osseous or soft tissue hypermetabolism to suggest metabolically active multiple myeloma. 2. Small lucencies noted within the right frontal calvarium, right T1 vertebral body and T4 vertebral body, nonspecific. Attention on follow-up imaging.   Pt has had MRI Brain completed on 12/26/2020 with results revealing 1. No definite T2 signal abnormality or pathologic enhancement along the visualized optic pathways, noting motion degradation of coronal STIR  images through the orbits. No retrobulbar mass and symmetric extraocular muscles. 2. No acute intracranial abnormality. 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 3. Questionable small faintly enhancing lesions in the right parietal and left temporal calvarium, indeterminate but favored to represent vasculature and less likely multiple myelomatous disease. Heterogeneous clivus without associated enhancement, nonspecific.   Pt has had Right Iliac Creast BM Bx completed on 12/25/2020 with results revealing 95% cellular bone marrow with greater than 90% involvement by monoclonal plasma cells showing cytoplasmic lambda light chain restriction.   Most recent lab results (12/31/2020) of CBC is as follows: all values are WNL except for  RBC at 2.70, Hgb at 8.0, HCT at 23.2, RDW at 20.2, PLT at 129K, Sodium at 132, Glucose at 138, Albumin  at 2.1, Total Protein at 11.3, AST at 60, ALT at 190. 12/31/2020 Beta 2 Microglobulin at 3.69 12/24/2020 M Spike at 8.03 g/dL 87/76/7978 IgG at 89768   On review of systems, pt reports blurry vision and denies bone pain, fatigue, loss of appetite, SOB, chest pain, headaches, dental pain, abdominal pain, leg swelling, tingling/numbness in hands/feet, pain along spine and any other symptoms.    On Social Hx the pt reports that he is non-smoker that does not drink much alcohol.   SUMMARY OF ONCOLOGIC HISTORY: Oncology History  Multiple myeloma in remission (HCC)  01/01/2021 Initial Diagnosis   Multiple myeloma not having achieved remission (HCC)   01/04/2021 - 01/11/2021 Chemotherapy   Patient is on Treatment Plan : MYELOMA NEWLY DIAGNOSED TRANSPLANT CANDIDATE Cyclophosphamide  IV + Bortezomib  SQ + Dexamethasone  (CyBorD) q21d x 4 cycles     05/21/2022 - 08/15/2022  Chemotherapy   Patient is on Treatment Plan : MYELOMA NEWLY DIAGNOSED TRANSPLANT CANDIDATE DaraVRd (Daratumumab  IV) q21d x 6 Cycles (Induction/Consolidation)     10/24/2022 -  Chemotherapy   Patient is  on Treatment Plan : MYELOMA NEWLY DIAGNOSED Daratumumab  IV + Lenalidomide  + Dexamethasone  Weekly (DaraRd) q28d     02/11/2023 Cancer Staging   Staging form: Plasma Cell Myeloma and Plasma Cell Disorders, AJCC 8th Edition - Clinical stage from 02/11/2023: High-risk cytogenetics: Present - Signed by [name], MD on 11/20/2023 Stage prefix: Initial diagnosis Cytogenetics: t(4;14) translocation     INTERVAL HISTORY:  Donald Suarez is a 59 y.o. male who is here today for continued evaluation and management of high-risk multiple myeloma.   he was last seen by me on 11/29/2024; at the time he did not have any concerns and was doing well.   Today, he reports he is feeling well. He acknowledges some recent vision changes. He uses glasses and needs to have his prescription rechecked.  He denies any toxicities with Dara faspro, Zometa  or Revlimid .   He denies any new infection issues, new bone pains, back pain and leg swelling.   REVIEW OF SYSTEMS:   10 Point review of systems of done and is negative except as noted above.  MEDICAL HISTORY Past Medical History:  Diagnosis Date   Abfraction 08/06/2022   Accretions on teeth 08/06/2022   Anemia    Attrition, teeth excessive 08/06/2022   Blurry vision 12/20/2020   yet to see ophthalmology due to language barrier  Blurry vision (ICD-10: H53.8) Potentially related to poor glycemic control Plan: Refer to ophthalmology for diabetic retinopathy screening.     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   Managed by oncology  On revlimid      Hypoalbuminemia 05/13/2022   Lab Results      Component    Value    Date           ALBUMIN     3.7    07/19/2024           ALBUMIN     3.8    07/05/2024           ALBUMIN     3.8    06/10/2024           ALBUMIN     3.7    06/03/2024           ALBUMIN     3.7    05/13/2024           ALBUMIN     4.0    04/29/2024           ALBUMIN     3.9     04/15/2024           ALBUMIN     4.0    03/25/2024           ALBUMIN     4.2    03/11/2024         Hypocalcemia 08/19/2022   In setting of multiple myeloma    Hypokalemia 03/09/2023   Lab Results      Component    Value    Date/Time           K    3.7    07/19/2024 07:46 AM           K    3.4 (L)    07/05/2024 09:33 AM  K    3.1 (L)    06/10/2024 12:04 PM           K    3.3 (L)    06/03/2024 08:41 AM           K    3.2 (L)    05/13/2024 12:13 PM           K    3.0 (L)    04/29/2024 11:07 AM           K    3.3 (L)    04/15/2024 07:42 AM           Hypomagnesemia 12/20/2020   Lab Results      Component    Value    Date/Time           MG    1.8    06/24/2024 08:34 AM           Loose, teeth 08/06/2022   Malocclusion 08/06/2022   Microalbuminuria 08/19/2022   Periodontal disease 08/06/2022   Peripheral neuropathy 09/08/2022   Started 08/2022 A/w diabetes and revlimid  usage Burning on top of left foot   Teeth missing 08/06/2022    SURGICAL HISTORY Past Surgical History:  Procedure Laterality Date   APPENDECTOMY     Ezri BONE MARROW BIOPSY & ASPIRATION  02/11/2023   Colum FLUORO GUIDE CV LINE RIGHT  05/15/2022   Oseias PATIENT EVAL TECH 0-60 MINS  05/19/2022   Rodriques US  GUIDE VASC ACCESS RIGHT  05/15/2022    SOCIAL HISTORY Social History[1]  Social History   Social History Narrative   Not on file    SOCIAL DRIVERS OF HEALTH SDOH Screenings   Food Insecurity: Food Insecurity Present (05/21/2022)  Transportation Needs: No Transportation Needs (05/21/2022)  Depression (PHQ2-9): Low Risk (01/10/2025)  Financial Resource Strain: Medium Risk (05/21/2022)  Tobacco Use: Low Risk (10/06/2024)     FAMILY HISTORY No family history on file.   ALLERGIES: has no known allergies.  MEDICATIONS  Current Outpatient Medications  Medication Sig Dispense Refill   Accu-Chek Softclix Lancets lancets Use to test blood sugars up to 4 times daily as directed. 100 each 11   acyclovir  (ZOVIRAX ) 400 MG tablet  Take 1 tablet (400 mg total) by mouth 2 (two) times daily. 60 tablet 11   aspirin  EC 81 MG tablet Take 1 tablet (81 mg total) by mouth daily. Swallow whole. 90 tablet 3   Blood Glucose Monitoring Suppl (ACCU-CHEK GUIDE) w/Device KIT Use to test blood sugars up to 4 times daily as directed. 1 kit 0   Cholecalciferol (VITAMIN D3) 125 MCG (5000 UT) CAPS Take 1 capsule (5,000 Units total) by mouth daily. 90 capsule 3   ciclopirox  (LOPROX ) 0.77 % cream Apply topically 2 (two) times daily. 90 g 3   Continuous Glucose Sensor (FREESTYLE LIBRE 3 PLUS SENSOR) MISC Change sensor every 15 days. 2 each 11   Cyanocobalamin  (B-12) 1000 MCG CAPS Take 1 tablet by mouth daily. 90 capsule 3   dapagliflozin  propanediol (FARXIGA ) 10 MG TABS tablet Take 1 tablet (10 mg total) by mouth daily before breakfast. sa asar sa hroi 90 tablet 3   ergocalciferol  (VITAMIN D2) 1.25 MG (50000 UT) capsule Take 1 capsule (50,000 Units total) by mouth once a week. 12 capsule 3   Finerenone  (KERENDIA ) 10 MG TABS Take 1 tablet (10 mg total) by mouth daily at 12 noon. 90 tablet 1   glucose blood (ACCU-CHEK GUIDE) test strip Use to test blood sugars  up to 4 times daily as directed. 100 each 11   lenalidomide  (REVLIMID ) 25 MG capsule TAKE 1 CAPSULE BY MOUTH 1 TIME A DAY FOR 21 DAYS ON THEN 7 DAYS OFF 21 capsule 0   losartan  (COZAAR ) 25 MG tablet Take 1 tablet (25 mg total) by mouth daily. Start taking if blood pressure is over 130/80.  Protects kidneys and lowers blood pressure. 90 tablet 3   Menaquinone-7 (VITAMIN K2 ) 100 MCG CAPS Take 1 capsule by mouth daily at 6 (six) AM. 90 capsule 3   metFORMIN  (GLUCOPHAGE ) 500 MG tablet Take 2 tablets (1,000 mg total) by mouth 2 (two) times daily with a meal. 2 asar 1 wot, 2 wot mnhum  amang 1 hroi 360 tablet 3   potassium chloride  SA (KLOR-CON  M) 20 MEQ tablet Take 1 tablet (20 mEq total) by mouth daily. 30 tablet 3   rosuvastatin  (CRESTOR ) 10 MG tablet Take 1 tablet (10 mg total) by mouth daily. 90  tablet 1   tirzepatide  (MOUNJARO ) 15 MG/0.5ML Pen Inject 15 mg into the skin once a week. Start after 4 weeks on 12.5 mg dose. 6 mL 4   Current Facility-Administered Medications  Medication Dose Route Frequency Provider Last Rate Last Admin   clotrimazole  (LOTRIMIN ) 1 % cream   Topical BID Jesus Bernardino MATSU, MD        PHYSICAL EXAMINATION: ECOG PERFORMANCE STATUS: 1 - Symptomatic but completely ambulatory VITALS: Vitals:   01/10/25 1021  BP: 138/87  Pulse: 68  Resp: 20  Temp: (!) 97.5 F (36.4 C)  SpO2: 98%   Filed Weights   01/10/25 1021  Weight: 176 lb 8 oz (80.1 kg)   Body mass index is 28.49 kg/m.  GENERAL: alert, in no acute distress and comfortable SKIN: no acute rashes, no significant lesions EYES: conjunctiva are pink and non-injected, sclera anicteric OROPHARYNX: MMM, no exudates, no oropharyngeal erythema or ulceration NECK: supple, no JVD LYMPH:  no palpable lymphadenopathy in the cervical, axillary or inguinal regions LUNGS: clear to auscultation b/l with normal respiratory effort HEART: regular rate & rhythm ABDOMEN:  normoactive bowel sounds , non tender, not distended, no hepatosplenomegaly Extremity: no pedal edema PSYCH: alert & oriented x 3 with fluent speech NEURO: no focal motor/sensory deficits  LABORATORY DATA:   I have reviewed the data as listed     Latest Ref Rng & Units 01/10/2025    8:08 AM 12/27/2024   10:39 AM 12/13/2024    1:12 PM  CBC EXTENDED  WBC 4.0 - 10.5 K/uL 9.4  8.1  8.8   RBC 4.22 - 5.81 MIL/uL 6.01  5.78  5.68   Hemoglobin 13.0 - 17.0 g/dL 83.9  84.6  84.7   HCT 39.0 - 52.0 % 46.9  45.4  44.3   Platelets 150 - 400 K/uL 192  174  192   NEUT# 1.7 - 7.7 K/uL 3.9  3.8  4.9   Lymph# 0.7 - 4.0 K/uL 4.1  3.2  2.7        Latest Ref Rng & Units 01/10/2025    8:08 AM 12/27/2024   10:39 AM 12/13/2024    1:12 PM  CMP  Glucose 70 - 99 mg/dL 827  759  786   BUN 6 - 20 mg/dL 13  14  15    Creatinine 0.61 - 1.24 mg/dL 9.30  9.22   9.20   Sodium 135 - 145 mmol/L 141  138  139   Potassium 3.5 - 5.1 mmol/L 3.9  3.6  3.3   Chloride 98 - 111 mmol/L 104  105  105   CO2 22 - 32 mmol/L 25  22  24    Calcium  8.9 - 10.3 mg/dL 8.9  8.1  8.6   Total Protein 6.5 - 8.1 g/dL 6.2  5.7  5.8   Total Bilirubin 0.0 - 1.2 mg/dL 0.5  0.5  0.4   Alkaline Phos 38 - 126 U/L 74  70  66   AST 15 - 41 U/L 21  24  18    ALT 0 - 44 U/L 34  28  24    MULTIPLE MYELOMA PANEL 07/2024 - 12/27/2025  Immunofixation shows IgG monoclonal protein with lambda light chain  specificity.  PLEASE NOTE:  Samples from patients receiving DARZALEX (R) (daratumumab ) or  SARCLISA(R)(isatuximab-irfc) treatment can appear as an  IgG kappa and mask a complete response (CR). If this patient  is receiving these therapies, this IFE assay interference  can be removed by ordering test number [id number]-Immunofixation,  Daratumumab -Specific, Serum or 123062-Immunofixation,  Isatuximab-Specific, Serum and submitting a new sample for  testing or by calling the lab to add this test to the current  sample.   RADIOGRAPHIC STUDIES: I have personally reviewed the radiological images as listed and agreed with the findings in the report. No results found.  ASSESSMENT & PLAN:  59 y.o. 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 -Patient diagnosed December 2021 and received 1 cycle of CyBorD. -Had previously presented with anemia renal insufficiency and hyperviscosity symptoms. -Bone marrow Bx- 90% plasma cells -Cytogenetics showed: Del(1p): Not Detected  Dup(1q): Not Detected  Gains(15): DETECTED  Gains(5 and 9): Not Detected  Del(13q)/-13: DETECTED  Del(17p)(TP53): Not Detected  IGH(Rearrangement): SEE BELOW   IgH complex: t(4;14): DETECTED  t(11;14): Not Detected  t(14;16): Not Detected  t(14;20): Not Detected   Cytogenetics: Normal male karyotype.   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 No notable toxicities from current treatment, Continue supportive medications ASA and Acyclovir    FOLLOW-UP Plz schedule next 3 cycles of Dara faspro as per integrated scheduling MD visit in 4-6 weeks Continue Zometa  every 12 weeks  The total time spent in the appointment was 30 minutes* .  All of the patient's questions were answered and the patient knows to call the clinic with any problems, questions, or concerns.  Emaline Saran MD MS AAHIVMS [facility] Hematology/Oncology Physician [facility]  *Total Encounter Time as defined by the Centers for Medicare and Medicaid Services includes, in addition to the face-to-face time of a patient visit (documented in the note above) non-face-to-face time: obtaining and reviewing outside history, ordering and reviewing medications, tests or procedures, care coordination (communications with other health care professionals or caregivers) and documentation in the medical record.  I, Alan Blowers, acting as a neurosurgeon for Emaline Saran, MD.,have documented all relevant documentation on the behalf of Emaline Saran, MD,as directed by  Emaline Saran, MD while in the presence of Emaline Saran, MD.  I have reviewed the above documentation for accuracy and completeness, and I agree with the above.  Bentlee Drier,  MD     [1]  Social History Tobacco Use   Smoking status: Never   Smokeless tobacco: Never  Vaping Use   Vaping status: Never Used  Substance Use Topics   Alcohol use: Never   Drug use: Never   "

## 2025-01-10 NOTE — Telephone Encounter (Signed)
 Sending as an FINANCIAL PLANNER. Looks like patient is already rescheduled.   Copied from CRM [id number]. Topic: Clinical - Medical Advice >> Jan 10, 2025 11:00 AM Terri MATSU wrote: Reason for CRM: Beth nurse at [facility] cone calling regarding patient got mixed up this morning and went to their office at 840 instead of here. Just wanted to let Dr.Morrison know what happened today.

## 2025-01-10 NOTE — Patient Instructions (Signed)
 [facility] - A DEPT OF [facility]  Discharge Instructions: Thank you for choosing [facility] to provide your oncology and hematology care.   If you have a lab appointment with the Cancer Center, please go directly to the Cancer Center and check in at the registration area.   Wear comfortable clothing and clothing appropriate for easy access to any Portacath or PICC line.   We strive to give you quality time with your provider. You may need to reschedule your appointment if you arrive late (15 or more minutes).  Arriving late affects you and other patients whose appointments are after yours.  Also, if you miss three or more appointments without notifying the office, you may be dismissed from the clinic at the provider's discretion.      For prescription refill requests, have your pharmacy contact our office and allow 72 hours for refills to be completed.    Today you received the following chemotherapy and/or immunotherapy agent: Daratumumab (Darzalex Faspro)      To help prevent nausea and vomiting after your treatment, we encourage you to take your nausea medication as directed.  BELOW ARE SYMPTOMS THAT SHOULD BE REPORTED IMMEDIATELY: *FEVER GREATER THAN 100.4 F (38 C) OR HIGHER *CHILLS OR SWEATING *NAUSEA AND VOMITING THAT IS NOT CONTROLLED WITH YOUR NAUSEA MEDICATION *UNUSUAL SHORTNESS OF BREATH *UNUSUAL BRUISING OR BLEEDING *URINARY PROBLEMS (pain or burning when urinating, or frequent urination) *BOWEL PROBLEMS (unusual diarrhea, constipation, pain near the anus) TENDERNESS IN MOUTH AND THROAT WITH OR WITHOUT PRESENCE OF ULCERS (sore throat, sores in mouth, or a toothache) UNUSUAL RASH, SWELLING OR PAIN  UNUSUAL VAGINAL DISCHARGE OR ITCHING   Items with * indicate a potential emergency and should be followed up as soon as possible or go to the Emergency Department if any problems should occur.  Please show the CHEMOTHERAPY ALERT  CARD or IMMUNOTHERAPY ALERT CARD at check-in to the Emergency Department and triage nurse.  Should you have questions after your visit or need to cancel or reschedule your appointment, please contact [facility] - A DEPT [facility]  Dept: [phone]  and follow the prompts.  Office hours are 8:00 a.m. to 4:30 p.m. Monday - Friday. Please note that voicemails left after 4:00 p.m. may not be returned until the following business day.  We are closed weekends and major holidays. You have access to a nurse at all times for urgent questions. Please call the main number to the clinic Dept: [phone] and follow the prompts.   For any non-urgent questions, you may also contact your provider using MyChart. We now offer e-Visits for anyone 58 and older to request care online for non-urgent symptoms. For details visit [email or web address].   Also download the MyChart app! Go to the app store, search "MyChart", open the app, select , and log in with your MyChart username and password.

## 2025-01-12 ENCOUNTER — Other Ambulatory Visit: Payer: Self-pay

## 2025-01-13 ENCOUNTER — Encounter: Payer: Self-pay | Admitting: Internal Medicine

## 2025-01-13 ENCOUNTER — Ambulatory Visit: Admitting: Internal Medicine

## 2025-01-13 VITALS — BP 126/68 | HR 70 | Temp 98.0°F | Ht 66.0 in | Wt 182.6 lb

## 2025-01-13 DIAGNOSIS — E1165 Type 2 diabetes mellitus with hyperglycemia: Secondary | ICD-10-CM | POA: Diagnosis not present

## 2025-01-13 DIAGNOSIS — E559 Vitamin D deficiency, unspecified: Secondary | ICD-10-CM | POA: Diagnosis not present

## 2025-01-13 DIAGNOSIS — Z794 Long term (current) use of insulin: Secondary | ICD-10-CM

## 2025-01-13 DIAGNOSIS — R718 Other abnormality of red blood cells: Secondary | ICD-10-CM | POA: Diagnosis not present

## 2025-01-13 DIAGNOSIS — I10 Essential (primary) hypertension: Secondary | ICD-10-CM

## 2025-01-13 LAB — LIPID PANEL
Cholesterol: 184 mg/dL (ref 28–200)
HDL: 40.8 mg/dL
LDL Cholesterol: 113 mg/dL — ABNORMAL HIGH (ref 10–99)
NonHDL: 142.77
Total CHOL/HDL Ratio: 4
Triglycerides: 151 mg/dL — ABNORMAL HIGH (ref 10.0–149.0)
VLDL: 30.2 mg/dL (ref 0.0–40.0)

## 2025-01-13 LAB — MICROALBUMIN / CREATININE URINE RATIO
Creatinine,U: 79.2 mg/dL
Microalb Creat Ratio: UNDETERMINED mg/g (ref 0.0–30.0)
Microalb, Ur: <0.7 mg/dL

## 2025-01-13 LAB — HEMOGLOBIN A1C: Hgb A1c MFr Bld: 8 % — ABNORMAL HIGH (ref 4.6–6.5)

## 2025-01-13 LAB — FERRITIN: Ferritin: 54.6 ng/mL (ref 22.0–322.0)

## 2025-01-13 MED ORDER — METFORMIN HCL 500 MG PO TABS: 1000.0000 mg | ORAL_TABLET | Freq: Two times a day (BID) | ORAL | 3 refills | Status: AC

## 2025-01-13 MED ORDER — DAPAGLIFLOZIN PROPANEDIOL 10 MG PO TABS: 10.0000 mg | ORAL_TABLET | Freq: Every day | ORAL | 3 refills | Status: AC

## 2025-01-13 MED ORDER — TIRZEPATIDE 15 MG/0.5ML ~~LOC~~ SOAJ: 15.0000 mg | SUBCUTANEOUS | 4 refills | Status: AC

## 2025-01-13 NOTE — Patient Instructions (Signed)
 "  It was a pleasure seeing you today! Your health and satisfaction are our top priorities.  Bernardino Cone, MD     Your Providers PCP: Cone Bernardino MATSU, MD,  [phone]) Referring Provider: Cone Bernardino MATSU, MD,  [phone]) Care Team Provider: [name], MD,  [phone])  NEXT STEPS: [x]  Early Intervention: Schedule sooner appointment, call our on-call services, or go to emergency room if there is any significant Increase in pain or discomfort New or worsening symptoms Sudden or severe changes in your health [x]  Flexible Follow-Up: We recommend a No follow-ups on file. for optimal routine care. This allows for progress monitoring and treatment adjustments. [x]  Preventive Care: Schedule your annual preventive care visit! It's typically covered by insurance and helps identify potential health issues early. [x]  Lab & X-ray Appointments: Incomplete tests scheduled today, or call to schedule. X-rays: [facility] (M-F, 8:30am-noon or 1pm-5pm). [x]  Medical Information Release: Sign a release form at front desk to obtain relevant medical information we don't have.  MAKING THE MOST OF OUR FOCUSED 20 MINUTE APPOINTMENTS: [x]   Clearly state your top concerns at the beginning of the visit to focus our discussion [x]   If you anticipate you will need more time, please inform the front desk during scheduling - we can book multiple appointments in the same week. [x]   If you have transportation problems- use our convenient video appointments or ask about transportation support. [x]   We can get down to business faster if you use MyChart to update information before the visit and submit non-urgent questions before your visit. Thank you for taking the time to provide details through MyChart.  Let our nurse know and she can import this information into your encounter documents.  Arrival and Wait Times: [x]   Arriving on time ensures that everyone receives prompt  attention. [x]   Early morning (8a) and afternoon (1p) appointments tend to have shortest wait times. [x]   Unfortunately, we cannot delay appointments for late arrivals or hold slots during phone calls.  Getting Answers and Following Up [x]   Simple Questions & Concerns: For quick questions or basic follow-up after your visit, reach us  at [phone] or MyChart messaging. [x]   Complex Concerns: If your concern is more complex, scheduling an appointment might be best. Discuss this with the staff to find the most suitable option. [x]   Lab & Imaging Results: We'll contact you directly if results are abnormal or you don't use MyChart. Most normal results will be on MyChart within 2-3 business days, with a review message from Dr. Cone. Haven't heard back in 2 weeks? Need results sooner? Contact us  at [phone]. [x]   Referrals: Our referral coordinator will manage specialist referrals. The specialist's office should contact you within 2 weeks to schedule an appointment. Call us  if you haven't heard from them after 2 weeks.  Staying Connected [x]   MyChart: Activate your MyChart for the fastest way to access results and message us . See the last page of this paperwork for instructions on how to activate.  Bring to Your Next Appointment [x]   Medications: Please bring all your medication bottles to your next appointment to ensure we have an accurate record of your prescriptions. [x]   Health Diaries: If you're monitoring any health conditions at home, keeping a diary of your readings can be very helpful for discussions at your next appointment.  Billing [x]   X-ray & Lab Orders: These are billed by separate companies. Contact the invoicing company directly for questions or concerns. [x]   Visit Charges: Discuss any billing inquiries with our administrative services team.  Your Satisfaction Matters [x]   Share Your Experience: We strive for your satisfaction! If you have any complaints, or preferably  compliments, please let Dr. Jesus know directly or contact our Practice Administrators, [name] or [employer], by asking at the front desk.   Reviewing Your Records [x]   Review this early draft of your clinical encounter notes below and the final encounter summary tomorrow on MyChart after its been completed.  All orders placed so far are visible here: Microcytosis  Type 2 diabetes mellitus with hyperglycemia, with long-term current use of insulin  (HCC)  Type 2 diabetes mellitus with hyperglycemia, without long-term current use of insulin  (HCC)       "

## 2025-01-13 NOTE — Progress Notes (Unsigned)
 ==============================  [facility]: [phone]   -- Medical Office Visit --  Patient: Donald Suarez      Age: 59 y.o.       Sex:  male  Date:   01/13/2025 Today's Healthcare Provider: Bernardino KANDICE Cone, MD  ==============================   Chief Complaint: Diabetes (States does not need interpreter today )   Discussed the use of AI scribe software for clinical note transcription with the patient, who gave verbal consent to proceed.  History of Present Illness Donald Suarez is a 59 year old male with diabetes mellitus and chronic kidney disease who presents for follow-up of his diabetes management.  He is focused on controlling his blood sugar and is on multiple medications, including Mounjaro  15 pen once a week, Farxiga  10 mg, and metformin  daily. He checks his blood sugar using strips and lancets but does not have a glucose meter. His last A1c in October was barely adequate. Despite adherence to medication, he finds sugar control challenging. He mentions eating regular rice instead of cauliflower rice, which was previously suggested to help with his sugar control.  He is taking losartan  and has chronic kidney disease. He is not taking Kerendia  due to cost concerns.  He has a history of low calcium  and low vitamin D  levels. He is on vitamin D  supplementation, including a high-dose vitamin D  50,000 IU prescribed last year. His calcium  levels have improved, but his vitamin D  levels remain insufficient.  He is on rosuvastatin  and takes aspirin  occasionally. He has a history of myeloma. He is aware of his risk for blood clots but reports no recent issues. He takes Revlimid .  He has immunosuppression. His IgG levels were checked recently.  He works in aeronautical engineer and reports that his blood pressure is well-controlled. No recent issues with blood clots.  Lab Results  Component Value Date   HGBA1C 6.9 (H) 10/06/2024   HGBA1C 7.1 (H) 06/24/2024   HGBA1C  7.0 (A) 03/21/2024    Lab Results  Component Value Date/Time   CREATININE 0.69 01/10/2025 08:08 AM   CREATININE 0.77 12/27/2024 10:39 AM   CREATININE 0.79 12/13/2024 01:12 PM   CREATININE 0.76 11/29/2024 12:05 PM   CREATININE 0.83 11/15/2024 12:04 PM   CREATININE 0.85 02/12/2024 09:09 AM   CREATININE 0.78 04/10/2023 12:14 PM   CREATININE 0.56 (L) 10/10/2022 01:04 PM   CREATININE 0.64 09/11/2022 01:55 PM   CREATININE 0.55 (L) 08/15/2022 09:45 AM   Lab Results  Component Value Date   GFR 100.81 10/06/2024   GFR 100.57 12/09/2023   GFR 102.43 09/09/2023   GFR 103.96 06/09/2023   GFR 108.05 08/12/2022   Lab Results  Component Value Date/Time   CALCIUM  8.9 01/10/2025 08:08 AM   CALCIUM  8.1 (L) 12/27/2024 10:39 AM   CALCIUM  8.6 (L) 12/13/2024 01:12 PM   CALCIUM  8.6 (L) 11/29/2024 12:05 PM   CALCIUM  8.4 (L) 11/15/2024 12:04 PM   CALCIUM  8.7 (L) 11/01/2024 11:58 AM   CALCIUM  9.4 10/18/2024 08:50 AM   CALCIUM  8.9 10/06/2024 10:21 AM   CALCIUM  8.7 (L) 10/04/2024 11:57 AM   CALCIUM  8.6 (L) 09/20/2024 11:10 AM    Lab Results  Component Value Date/Time   VD25OH 14.91 (L) 10/06/2024 10:21 AM   VD25OH 16.19 (L) 06/24/2024 08:34 AM   VD25OH 16.61 (L) 01/01/2021 12:36 PM   Lab Results  Component Value Date   MCV 78.0 (L) 01/10/2025   MCV 78.5 (L) 12/27/2024   MCV 78.0 (L) 12/13/2024  MCV 78.1 (L) 11/29/2024   MCV 78.9 (L) 11/15/2024   MCV 79.3 (L) 11/01/2024   MCV 80.2 10/18/2024   MCV 83.3 10/06/2024   MCV 79.7 (L) 10/04/2024   MCV 79.8 (L) 09/20/2024   MCV 80.5 09/06/2024   MCV 80.7 08/23/2024   MCV 79.8 (L) 08/09/2024   MCV 78.8 (L) 07/19/2024   MCV 79.2 (L) 07/05/2024   MCV 77.9 (L) 06/10/2024   MCV 78.1 (L) 06/03/2024   MCV 78.0 (L) 05/13/2024   MCV 78.7 (L) 04/29/2024   MCV 78.9 (L) 04/15/2024   MCV 80.9 03/25/2024   MCV 80.0 03/11/2024   MCV 80.0 02/26/2024   MCV 78.5 (L) 02/12/2024   MCV 79.9 (L) 01/15/2024   MCV 76.8 (L) 12/18/2023   MCV 81.0 12/09/2023    MCV 79.1 (L) 11/20/2023   MCV 77.1 (L) 10/23/2023   MCV 76.1 (L) 09/25/2023   MCV 78.0 09/09/2023   MCV 76.2 (L) 08/28/2023   MCV 76.7 (L) 07/31/2023   MCV 78.7 (L) 07/03/2023   MCV 80.6 06/09/2023   MCV 77.4 (L) 06/05/2023   MCV 79.0 (L) 05/08/2023   MCV 78.1 (L) 04/24/2023   MCV 77.9 (L) 04/10/2023   MCV 79.4 (L) 03/27/2023   MCV 78.2 (L) 03/13/2023   MCV 79.1 (L) 02/27/2023   MCV 79.8 (L) 02/13/2023   MCV 81.9 02/11/2023   MCV 78.8 (L) 01/30/2023   MCV 79.2 (L) 01/16/2023   MCV 79.5 (L) 01/02/2023   MCV 78.8 (L) 12/19/2022   MCV 77.4 (L) 12/05/2022   MCV 78.4 (L) 11/21/2022    Lab Results  Component Value Date   FERRITIN 211.8 12/09/2023    Background Reviewed: Problem List: has Multiple myeloma in remission (HCC); Counseling regarding advance care planning and goals of care; Hyperviscosity; Type 2 diabetes mellitus with hyperglycemia (HCC); Chronic apical periodontitis; Hypocalcemia; Language barrier affecting health care; History of dental problems; Diabetes mellitus with proteinuria (HCC); Hypertension; Chronic kidney disease; Microcytosis; Hypogammaglobulinemia; and Vitamin D  deficiency on their problem list. Past Medical History:  has a past medical history of Abfraction (08/06/2022), Accretions on teeth (08/06/2022), Anemia, Attrition, teeth excessive (08/06/2022), Blurry vision (12/20/2020), Caries (08/06/2022), Defective dental restoration (08/06/2022), Elevated ferritin (08/07/2022), Hyperproteinemia (12/20/2020), Hypoalbuminemia (05/13/2022), Hypocalcemia (08/19/2022), Hypokalemia (03/09/2023), Hypomagnesemia (12/20/2020), Loose, teeth (08/06/2022), Malocclusion (08/06/2022), Microalbuminuria (08/19/2022), Periodontal disease (08/06/2022), Peripheral neuropathy (09/08/2022), and Teeth missing (08/06/2022). Past Surgical History:   has a past surgical history that includes Appendectomy; [place] US  Guide Vasc Access Right (05/15/2022); [place] Fluoro Guide CV Line Right  (05/15/2022); [place]ou PATIENT EVAL TECH 0-60 MINS (05/19/2022); and [place] BONE MARROW BIOPSY & ASPIRATION (02/11/2023). Social History:   reports that he has never smoked. He has never used smokeless tobacco. He reports that he does not drink alcohol and does not use drugs. Family History:  family history is not on file. Allergies:  has no known allergies.   Medication Reconciliation: Current Outpatient Medications on File Prior to Visit  Medication Sig   Accu-Chek Softclix Lancets lancets Use to test blood sugars up to 4 times daily as directed.   acyclovir  (ZOVIRAX ) 400 MG tablet Take 1 tablet (400 mg total) by mouth 2 (two) times daily.   aspirin  EC 81 MG tablet Take 1 tablet (81 mg total) by mouth daily. Swallow whole.   Blood Glucose Monitoring Suppl (ACCU-CHEK GUIDE) w/Device KIT Use to test blood sugars up to 4 times daily as directed.   Cholecalciferol (VITAMIN D3) 125 MCG (5000 UT) CAPS Take 1 capsule (5,000 Units  total) by mouth daily.   ciclopirox  (LOPROX ) 0.77 % cream Apply topically 2 (two) times daily.   Continuous Glucose Sensor (FREESTYLE LIBRE 3 PLUS SENSOR) MISC Change sensor every 15 days.   Cyanocobalamin  (B-12) 1000 MCG CAPS Take 1 tablet by mouth daily.   dapagliflozin  propanediol (FARXIGA ) 10 MG TABS tablet Take 1 tablet (10 mg total) by mouth daily before breakfast. sa asar sa hroi   ergocalciferol  (VITAMIN D2) 1.25 MG (50000 UT) capsule Take 1 capsule (50,000 Units total) by mouth once a week.   Finerenone  (KERENDIA ) 10 MG TABS Take 1 tablet (10 mg total) by mouth daily at 12 noon.   glucose blood (ACCU-CHEK GUIDE) test strip Use to test blood sugars up to 4 times daily as directed.   lenalidomide  (REVLIMID ) 25 MG capsule TAKE 1 CAPSULE BY MOUTH 1 TIME A DAY FOR 21 DAYS ON THEN 7 DAYS OFF   losartan  (COZAAR ) 25 MG tablet Take 1 tablet (25 mg total) by mouth daily. Start taking if blood pressure is over 130/80.  Protects kidneys and lowers blood pressure.   Menaquinone-7 (VITAMIN  K2) 100 MCG CAPS Take 1 capsule by mouth daily at 6 (six) AM.   metFORMIN  (GLUCOPHAGE ) 500 MG tablet Take 2 tablets (1,000 mg total) by mouth 2 (two) times daily with a meal. 2 asar 1 wot, 2 wot mnhum  amang 1 hroi   potassium chloride  SA (KLOR-CON  M) 20 MEQ tablet Take 1 tablet (20 mEq total) by mouth daily.   rosuvastatin  (CRESTOR ) 10 MG tablet Take 1 tablet (10 mg total) by mouth daily.   tirzepatide  (MOUNJARO ) 15 MG/0.5ML Pen Inject 15 mg into the skin once a week. Start after 4 weeks on 12.5 mg dose.   Current Facility-Administered Medications on File Prior to Visit  Medication   clotrimazole  (LOTRIMIN ) 1 % cream  There are no discontinued medications.   Physical Exam:    01/13/2025    9:30 AM 01/10/2025    1:34 PM 01/10/2025   10:21 AM  Vitals with BMI  Height 5' 6    Weight 182 lbs 10 oz  176 lbs 8 oz  BMI 29.49    Systolic 126 141 861  Diastolic 68 86 87  Pulse 70 77 68  Vital signs reviewed.  Nursing notes reviewed. Weight trend reviewed. Physical Activity: Not on file   General Appearance:  No acute distress appreciable.   Well-groomed, healthy-appearing male.  Well proportioned with no abnormal fat distribution.  Good muscle tone. Pulmonary:  Normal work of breathing at rest, no respiratory distress apparent. SpO2: 98 %  Musculoskeletal: All extremities are intact.  Neurological:  Awake, alert, oriented, and engaged.  No obvious focal neurological deficits or cognitive impairments.  Sensorium seems unclouded.   Speech is clear and coherent with logical content. Psychiatric:  Appropriate mood, pleasant and cooperative demeanor, thoughtful and engaged during the exam   Verbalized to patient: Physical Exam    Results:   Verbalized to patient: Results Labs HbA1c (09/2024): Barely adequate glycemic control Serum calcium  (01/10/2025): Improved compared to prior, no longer low Serum vitamin D  (01/10/2025): Insufficient Serum IgG (12/30/2024): Within normal  limits RBC indices: Microcytosis     01/10/2025   11:18 AM 10/18/2024   11:00 AM 10/04/2024   12:24 PM 09/20/2024    1:02 PM  PHQ 2/9 Scores  PHQ - 2 Score 0 0 0 0    { {Insert previous labs (optional):23779} {See past labs  Heme  Chem  Endocrine  Serology  Results Review (optional):1} No results found for any visits on 01/13/25.} Appointment on 01/10/2025  Component Date Value Ref Range Status   Sodium 01/10/2025 141  135 - 145 mmol/L Final   Potassium 01/10/2025 3.9  3.5 - 5.1 mmol/L Final   Chloride 01/10/2025 104  98 - 111 mmol/L Final   CO2 01/10/2025 25  22 - 32 mmol/L Final   Glucose, Bld 01/10/2025 172 (H)  70 - 99 mg/dL Final   BUN 98/86/7973 13  6 - 20 mg/dL Final   Creatinine, Ser 01/10/2025 0.69  0.61 - 1.24 mg/dL Final   Calcium  01/10/2025 8.9  8.9 - 10.3 mg/dL Final   Total Protein 98/86/7973 6.2 (L)  6.5 - 8.1 g/dL Final   Albumin  01/10/2025 3.7  3.5 - 5.0 g/dL Final   AST 98/86/7973 21  15 - 41 U/L Final   ALT 01/10/2025 34  0 - 44 U/L Final   Alkaline Phosphatase 01/10/2025 74  38 - 126 U/L Final   Total Bilirubin 01/10/2025 0.5  0.0 - 1.2 mg/dL Final   GFR, Estimated 01/10/2025 >60  >60 mL/min Final   Anion gap 01/10/2025 11  5 - 15 Final   WBC Count 01/10/2025 9.4  4.0 - 10.5 K/uL Final   RBC 01/10/2025 6.01 (H)  4.22 - 5.81 MIL/uL Final   Hemoglobin 01/10/2025 16.0  13.0 - 17.0 g/dL Final   HCT 98/86/7973 46.9  39.0 - 52.0 % Final   MCV 01/10/2025 78.0 (L)  80.0 - 100.0 fL Final   MCH 01/10/2025 26.6  26.0 - 34.0 pg Final   MCHC 01/10/2025 34.1  30.0 - 36.0 g/dL Final   RDW 98/86/7973 15.3  11.5 - 15.5 % Final   Platelet Count 01/10/2025 192  150 - 400 K/uL Final   nRBC 01/10/2025 0.0  0.0 - 0.2 % Final   Neutrophils Relative % 01/10/2025 41  % Final   Neutro Abs 01/10/2025 3.9  1.7 - 7.7 K/uL Final   Lymphocytes Relative 01/10/2025 44  % Final   Lymphs Abs 01/10/2025 4.1 (H)  0.7 - 4.0 K/uL Final   Monocytes Relative 01/10/2025 11  % Final    Monocytes Absolute 01/10/2025 1.0  0.1 - 1.0 K/uL Final   Eosinophils Relative 01/10/2025 3  % Final   Eosinophils Absolute 01/10/2025 0.3  0.0 - 0.5 K/uL Final   Basophils Relative 01/10/2025 1  % Final   Basophils Absolute 01/10/2025 0.1  0.0 - 0.1 K/uL Final   Immature Granulocytes 01/10/2025 0  % Final   Abs Immature Granulocytes 01/10/2025 0.03  0.00 - 0.07 K/uL Final  Appointment on 12/27/2024  Component Date Value Ref Range Status   WBC Count 12/27/2024 8.1  4.0 - 10.5 K/uL Final   RBC 12/27/2024 5.78  4.22 - 5.81 MIL/uL Final   Hemoglobin 12/27/2024 15.3  13.0 - 17.0 g/dL Final   HCT 87/69/7974 45.4  39.0 - 52.0 % Final   MCV 12/27/2024 78.5 (L)  80.0 - 100.0 fL Final   MCH 12/27/2024 26.5  26.0 - 34.0 pg Final   MCHC 12/27/2024 33.7  30.0 - 36.0 g/dL Final   RDW 87/69/7974 14.8  11.5 - 15.5 % Final   Platelet Count 12/27/2024 174  150 - 400 K/uL Final   nRBC 12/27/2024 0.0  0.0 - 0.2 % Final   Neutrophils Relative % 12/27/2024 47  % Final   Neutro Abs 12/27/2024 3.8  1.7 - 7.7 K/uL Final   Lymphocytes Relative 12/27/2024 40  %  Final   Lymphs Abs 12/27/2024 3.2  0.7 - 4.0 K/uL Final   Monocytes Relative 12/27/2024 10  % Final   Monocytes Absolute 12/27/2024 0.8  0.1 - 1.0 K/uL Final   Eosinophils Relative 12/27/2024 2  % Final   Eosinophils Absolute 12/27/2024 0.2  0.0 - 0.5 K/uL Final   Basophils Relative 12/27/2024 1  % Final   Basophils Absolute 12/27/2024 0.1  0.0 - 0.1 K/uL Final   Immature Granulocytes 12/27/2024 0  % Final   Abs Immature Granulocytes 12/27/2024 0.02  0.00 - 0.07 K/uL Final   IgG (Immunoglobin G), Serum 12/27/2024 577 (L)  603 - 1,613 mg/dL Final   IgA 87/69/7974 110  90 - 386 mg/dL Final   IgM (Immunoglobulin M), Srm 12/27/2024 28  20 - 172 mg/dL Final   Total Protein ELP 12/27/2024 5.2 (L)  6.0 - 8.5 g/dL Corrected   Albumin  SerPl Elph-Mcnc 12/27/2024 2.9  2.9 - 4.4 g/dL Corrected   Alpha 1 87/69/7974 0.1  0.0 - 0.4 g/dL Corrected   Alpha2 Glob  SerPl Elph-Mcnc 12/27/2024 0.8  0.4 - 1.0 g/dL Corrected   B-Globulin SerPl Elph-Mcnc 12/27/2024 0.8  0.7 - 1.3 g/dL Corrected   Gamma Glob SerPl Elph-Mcnc 12/27/2024 0.6  0.4 - 1.8 g/dL Corrected   M Protein SerPl Elph-Mcnc 12/27/2024 0.3 (H)  Not Observed g/dL Corrected   Globulin, Total 12/27/2024 2.3  2.2 - 3.9 g/dL Corrected   Albumin /Glob SerPl 12/27/2024 1.3  0.7 - 1.7 Corrected   IFE 1 12/27/2024 Comment (A)   Corrected   Please Note 12/27/2024 Comment   Corrected   Sodium 12/27/2024 138  135 - 145 mmol/L Final   Potassium 12/27/2024 3.6  3.5 - 5.1 mmol/L Final   Chloride 12/27/2024 105  98 - 111 mmol/L Final   CO2 12/27/2024 22  22 - 32 mmol/L Final   Glucose, Bld 12/27/2024 240 (H)  70 - 99 mg/dL Final   BUN 87/69/7974 14  6 - 20 mg/dL Final   Creatinine, Ser 12/27/2024 0.77  0.61 - 1.24 mg/dL Final   Calcium  12/27/2024 8.1 (L)  8.9 - 10.3 mg/dL Final   Total Protein 87/69/7974 5.7 (L)  6.5 - 8.1 g/dL Final   Albumin  12/27/2024 3.6  3.5 - 5.0 g/dL Final   AST 87/69/7974 24  15 - 41 U/L Final   ALT 12/27/2024 28  0 - 44 U/L Final   Alkaline Phosphatase 12/27/2024 70  38 - 126 U/L Final   Total Bilirubin 12/27/2024 0.5  0.0 - 1.2 mg/dL Final   GFR, Estimated 12/27/2024 >60  >60 mL/min Final   Anion gap 12/27/2024 10  5 - 15 Final  Appointment on 12/13/2024  Component Date Value Ref Range Status   WBC Count 12/13/2024 8.8  4.0 - 10.5 K/uL Final   RBC 12/13/2024 5.68  4.22 - 5.81 MIL/uL Final   Hemoglobin 12/13/2024 15.2  13.0 - 17.0 g/dL Final   HCT 87/83/7974 44.3  39.0 - 52.0 % Final   MCV 12/13/2024 78.0 (L)  80.0 - 100.0 fL Final   MCH 12/13/2024 26.8  26.0 - 34.0 pg Final   MCHC 12/13/2024 34.3  30.0 - 36.0 g/dL Final   RDW 87/83/7974 14.7  11.5 - 15.5 % Final   Platelet Count 12/13/2024 192  150 - 400 K/uL Final   nRBC 12/13/2024 0.0  0.0 - 0.2 % Final   Neutrophils Relative % 12/13/2024 57  % Final   Neutro Abs 12/13/2024 4.9  1.7 -  7.7 K/uL Final   Lymphocytes  Relative 12/13/2024 31  % Final   Lymphs Abs 12/13/2024 2.7  0.7 - 4.0 K/uL Final   Monocytes Relative 12/13/2024 9  % Final   Monocytes Absolute 12/13/2024 0.8  0.1 - 1.0 K/uL Final   Eosinophils Relative 12/13/2024 2  % Final   Eosinophils Absolute 12/13/2024 0.2  0.0 - 0.5 K/uL Final   Basophils Relative 12/13/2024 1  % Final   Basophils Absolute 12/13/2024 0.1  0.0 - 0.1 K/uL Final   Immature Granulocytes 12/13/2024 0  % Final   Abs Immature Granulocytes 12/13/2024 0.03  0.00 - 0.07 K/uL Final   Sodium 12/13/2024 139  135 - 145 mmol/L Final   Potassium 12/13/2024 3.3 (L)  3.5 - 5.1 mmol/L Final   Chloride 12/13/2024 105  98 - 111 mmol/L Final   CO2 12/13/2024 24  22 - 32 mmol/L Final   Glucose, Bld 12/13/2024 213 (H)  70 - 99 mg/dL Final   BUN 87/83/7974 15  6 - 20 mg/dL Final   Creatinine, Ser 12/13/2024 0.79  0.61 - 1.24 mg/dL Final   Calcium  12/13/2024 8.6 (L)  8.9 - 10.3 mg/dL Final   Total Protein 87/83/7974 5.8 (L)  6.5 - 8.1 g/dL Final   Albumin  12/13/2024 3.6  3.5 - 5.0 g/dL Final   AST 87/83/7974 18  15 - 41 U/L Final   ALT 12/13/2024 24  0 - 44 U/L Final   Alkaline Phosphatase 12/13/2024 66  38 - 126 U/L Final   Total Bilirubin 12/13/2024 0.4  0.0 - 1.2 mg/dL Final   GFR, Estimated 12/13/2024 >60  >60 mL/min Final   Anion gap 12/13/2024 10  5 - 15 Final   IgG (Immunoglobin G), Serum 12/13/2024 566 (L)  603 - 1,613 mg/dL Final   IgA 87/83/7974 110  90 - 386 mg/dL Final   IgM (Immunoglobulin M), Srm 12/13/2024 28  20 - 172 mg/dL Final   Total Protein ELP 12/13/2024 4.9 (L)  6.0 - 8.5 g/dL Corrected   Albumin  SerPl Elph-Mcnc 12/13/2024 2.9  2.9 - 4.4 g/dL Corrected   Alpha 1 87/83/7974 0.1  0.0 - 0.4 g/dL Corrected   Alpha2 Glob SerPl Elph-Mcnc 12/13/2024 0.8  0.4 - 1.0 g/dL Corrected   B-Globulin SerPl Elph-Mcnc 12/13/2024 0.7  0.7 - 1.3 g/dL Corrected   Gamma Glob SerPl Elph-Mcnc 12/13/2024 0.4  0.4 - 1.8 g/dL Corrected   M Protein SerPl Elph-Mcnc 12/13/2024 0.3 (H)   Not Observed g/dL Corrected   Globulin, Total 12/13/2024 2.0 (L)  2.2 - 3.9 g/dL Corrected   Albumin /Glob SerPl 12/13/2024 1.5  0.7 - 1.7 Corrected   IFE 1 12/13/2024 Comment (A)   Corrected   Please Note 12/13/2024 Comment   Corrected  Appointment on 11/29/2024  Component Date Value Ref Range Status   IgG (Immunoglobin G), Serum 11/29/2024 771  603 - 1,613 mg/dL Final   IgA 87/97/7974 142  90 - 386 mg/dL Final   IgM (Immunoglobulin M), Srm 11/29/2024 30  20 - 172 mg/dL Final   Total Protein ELP 11/29/2024 6.1  6.0 - 8.5 g/dL Corrected   Albumin  SerPl Elph-Mcnc 11/29/2024 3.3  2.9 - 4.4 g/dL Corrected   Alpha 1 87/97/7974 0.2  0.0 - 0.4 g/dL Corrected   Alpha2 Glob SerPl Elph-Mcnc 11/29/2024 1.1 (H)  0.4 - 1.0 g/dL Corrected   B-Globulin SerPl Elph-Mcnc 11/29/2024 0.9  0.7 - 1.3 g/dL Corrected   Gamma Glob SerPl Elph-Mcnc 11/29/2024 0.7  0.4 - 1.8  g/dL Corrected   M Protein SerPl Elph-Mcnc 11/29/2024 0.4 (H)  Not Observed g/dL Corrected   Globulin, Total 11/29/2024 2.8  2.2 - 3.9 g/dL Corrected   Albumin /Glob SerPl 11/29/2024 1.2  0.7 - 1.7 Corrected   IFE 1 11/29/2024 Comment (A)   Corrected   Please Note 11/29/2024 Comment   Corrected   Sodium 11/29/2024 136  135 - 145 mmol/L Final   Potassium 11/29/2024 3.7  3.5 - 5.1 mmol/L Final   Chloride 11/29/2024 102  98 - 111 mmol/L Final   CO2 11/29/2024 22  22 - 32 mmol/L Final   Glucose, Bld 11/29/2024 261 (H)  70 - 99 mg/dL Final   BUN 87/97/7974 11  6 - 20 mg/dL Final   Creatinine, Ser 11/29/2024 0.76  0.61 - 1.24 mg/dL Final   Calcium  11/29/2024 8.6 (L)  8.9 - 10.3 mg/dL Final   Total Protein 87/97/7974 6.4 (L)  6.5 - 8.1 g/dL Final   Albumin  11/29/2024 3.8  3.5 - 5.0 g/dL Final   AST 87/97/7974 31  15 - 41 U/L Final   ALT 11/29/2024 39  0 - 44 U/L Final   Alkaline Phosphatase 11/29/2024 83  38 - 126 U/L Final   Total Bilirubin 11/29/2024 0.8  0.0 - 1.2 mg/dL Final   GFR, Estimated 11/29/2024 >60  >60 mL/min Final   Anion gap  11/29/2024 12  5 - 15 Final   WBC Count 11/29/2024 7.7  4.0 - 10.5 K/uL Final   RBC 11/29/2024 6.39 (H)  4.22 - 5.81 MIL/uL Final   Hemoglobin 11/29/2024 16.7  13.0 - 17.0 g/dL Final   HCT 87/97/7974 49.9  39.0 - 52.0 % Final   MCV 11/29/2024 78.1 (L)  80.0 - 100.0 fL Final   MCH 11/29/2024 26.1  26.0 - 34.0 pg Final   MCHC 11/29/2024 33.5  30.0 - 36.0 g/dL Final   RDW 87/97/7974 15.4  11.5 - 15.5 % Final   Platelet Count 11/29/2024 169  150 - 400 K/uL Final   nRBC 11/29/2024 0.0  0.0 - 0.2 % Final   Neutrophils Relative % 11/29/2024 49  % Final   Neutro Abs 11/29/2024 3.7  1.7 - 7.7 K/uL Final   Lymphocytes Relative 11/29/2024 35  % Final   Lymphs Abs 11/29/2024 2.7  0.7 - 4.0 K/uL Final   Monocytes Relative 11/29/2024 10  % Final   Monocytes Absolute 11/29/2024 0.8  0.1 - 1.0 K/uL Final   Eosinophils Relative 11/29/2024 5  % Final   Eosinophils Absolute 11/29/2024 0.4  0.0 - 0.5 K/uL Final   Basophils Relative 11/29/2024 1  % Final   Basophils Absolute 11/29/2024 0.1  0.0 - 0.1 K/uL Final   Immature Granulocytes 11/29/2024 0  % Final   Abs Immature Granulocytes 11/29/2024 0.03  0.00 - 0.07 K/uL Final  Appointment on 11/15/2024  Component Date Value Ref Range Status   WBC Count 11/15/2024 9.2  4.0 - 10.5 K/uL Final   RBC 11/15/2024 5.82 (H)  4.22 - 5.81 MIL/uL Final   Hemoglobin 11/15/2024 15.6  13.0 - 17.0 g/dL Final   HCT 88/81/7974 45.9  39.0 - 52.0 % Final   MCV 11/15/2024 78.9 (L)  80.0 - 100.0 fL Final   MCH 11/15/2024 26.8  26.0 - 34.0 pg Final   MCHC 11/15/2024 34.0  30.0 - 36.0 g/dL Final   RDW 88/81/7974 14.7  11.5 - 15.5 % Final   Platelet Count 11/15/2024 186  150 - 400 K/uL Final  nRBC 11/15/2024 0.0  0.0 - 0.2 % Final   Neutrophils Relative % 11/15/2024 61  % Final   Neutro Abs 11/15/2024 5.6  1.7 - 7.7 K/uL Final   Lymphocytes Relative 11/15/2024 29  % Final   Lymphs Abs 11/15/2024 2.7  0.7 - 4.0 K/uL Final   Monocytes Relative 11/15/2024 7  % Final    Monocytes Absolute 11/15/2024 0.6  0.1 - 1.0 K/uL Final   Eosinophils Relative 11/15/2024 2  % Final   Eosinophils Absolute 11/15/2024 0.2  0.0 - 0.5 K/uL Final   Basophils Relative 11/15/2024 1  % Final   Basophils Absolute 11/15/2024 0.1  0.0 - 0.1 K/uL Final   Immature Granulocytes 11/15/2024 0  % Final   Abs Immature Granulocytes 11/15/2024 0.03  0.00 - 0.07 K/uL Final   Sodium 11/15/2024 138  135 - 145 mmol/L Final   Potassium 11/15/2024 3.6  3.5 - 5.1 mmol/L Final   Chloride 11/15/2024 104  98 - 111 mmol/L Final   CO2 11/15/2024 22  22 - 32 mmol/L Final   Glucose, Bld 11/15/2024 199 (H)  70 - 99 mg/dL Final   BUN 88/81/7974 16  6 - 20 mg/dL Final   Creatinine, Ser 11/15/2024 0.83  0.61 - 1.24 mg/dL Final   Calcium  11/15/2024 8.4 (L)  8.9 - 10.3 mg/dL Final   Total Protein 88/81/7974 5.6 (L)  6.5 - 8.1 g/dL Final   Albumin  11/15/2024 3.7  3.5 - 5.0 g/dL Final   AST 88/81/7974 22  15 - 41 U/L Final   ALT 11/15/2024 27  0 - 44 U/L Final   Alkaline Phosphatase 11/15/2024 62  38 - 126 U/L Final   Total Bilirubin 11/15/2024 0.7  0.0 - 1.2 mg/dL Final   GFR, Estimated 11/15/2024 >60  >60 mL/min Final   Anion gap 11/15/2024 12  5 - 15 Final   IgG (Immunoglobin G), Serum 11/15/2024 570 (L)  603 - 1,613 mg/dL Final   IgA 88/81/7974 106  90 - 386 mg/dL Final   IgM (Immunoglobulin M), Srm 11/15/2024 26  20 - 172 mg/dL Final   Total Protein ELP 11/15/2024 5.6 (L)  6.0 - 8.5 g/dL Corrected   Albumin  SerPl Elph-Mcnc 11/15/2024 3.2  2.9 - 4.4 g/dL Corrected   Alpha 1 88/81/7974 0.1  0.0 - 0.4 g/dL Corrected   Alpha2 Glob SerPl Elph-Mcnc 11/15/2024 0.8  0.4 - 1.0 g/dL Corrected   B-Globulin SerPl Elph-Mcnc 11/15/2024 0.8  0.7 - 1.3 g/dL Corrected   Gamma Glob SerPl Elph-Mcnc 11/15/2024 0.6  0.4 - 1.8 g/dL Corrected   M Protein SerPl Elph-Mcnc 11/15/2024 0.3 (H)  Not Observed g/dL Corrected   Globulin, Total 11/15/2024 2.4  2.2 - 3.9 g/dL Corrected   Albumin /Glob SerPl 11/15/2024 1.4  0.7 - 1.7  Corrected   IFE 1 11/15/2024 Comment (A)   Corrected   Please Note 11/15/2024 Comment   Corrected  Appointment on 11/01/2024  Component Date Value Ref Range Status   IgG (Immunoglobin G), Serum 11/01/2024 707  603 - 1,613 mg/dL Final   IgA 88/95/7974 135  90 - 386 mg/dL Final   IgM (Immunoglobulin M), Srm 11/01/2024 32  20 - 172 mg/dL Final   Total Protein ELP 11/01/2024 6.2  6.0 - 8.5 g/dL Corrected   Albumin  SerPl Elph-Mcnc 11/01/2024 3.6  2.9 - 4.4 g/dL Corrected   Alpha 1 88/95/7974 0.1  0.0 - 0.4 g/dL Corrected   Alpha2 Glob SerPl Elph-Mcnc 11/01/2024 1.0  0.4 - 1.0 g/dL Corrected  B-Globulin SerPl Elph-Mcnc 11/01/2024 1.0  0.7 - 1.3 g/dL Corrected   Gamma Glob SerPl Elph-Mcnc 11/01/2024 0.6  0.4 - 1.8 g/dL Corrected   M Protein SerPl Elph-Mcnc 11/01/2024 0.3 (H)  Not Observed g/dL Corrected   Globulin, Total 11/01/2024 2.6  2.2 - 3.9 g/dL Corrected   Albumin /Glob SerPl 11/01/2024 1.4  0.7 - 1.7 Corrected   IFE 1 11/01/2024 Comment (A)   Corrected   Please Note 11/01/2024 Comment   Corrected   Sodium 11/01/2024 140  135 - 145 mmol/L Final   Potassium 11/01/2024 3.5  3.5 - 5.1 mmol/L Final   Chloride 11/01/2024 109  98 - 111 mmol/L Final   CO2 11/01/2024 23  22 - 32 mmol/L Final   Glucose, Bld 11/01/2024 168 (H)  70 - 99 mg/dL Final   BUN 88/95/7974 17  6 - 20 mg/dL Final   Creatinine, Ser 11/01/2024 0.86  0.61 - 1.24 mg/dL Final   Calcium  11/01/2024 8.7 (L)  8.9 - 10.3 mg/dL Final   Total Protein 88/95/7974 6.2 (L)  6.5 - 8.1 g/dL Final   Albumin  11/01/2024 3.9  3.5 - 5.0 g/dL Final   AST 88/95/7974 16  15 - 41 U/L Final   ALT 11/01/2024 21  0 - 44 U/L Final   Alkaline Phosphatase 11/01/2024 59  38 - 126 U/L Final   Total Bilirubin 11/01/2024 0.4  0.0 - 1.2 mg/dL Final   GFR, Estimated 11/01/2024 >60  >60 mL/min Final   Anion gap 11/01/2024 8  5 - 15 Final   WBC Count 11/01/2024 9.5  4.0 - 10.5 K/uL Final   RBC 11/01/2024 6.08 (H)  4.22 - 5.81 MIL/uL Final   Hemoglobin  11/01/2024 16.5  13.0 - 17.0 g/dL Final   HCT 88/95/7974 48.2  39.0 - 52.0 % Final   MCV 11/01/2024 79.3 (L)  80.0 - 100.0 fL Final   MCH 11/01/2024 27.1  26.0 - 34.0 pg Final   MCHC 11/01/2024 34.2  30.0 - 36.0 g/dL Final   RDW 88/95/7974 14.6  11.5 - 15.5 % Final   Platelet Count 11/01/2024 183  150 - 400 K/uL Final   nRBC 11/01/2024 0.0  0.0 - 0.2 % Final   Neutrophils Relative % 11/01/2024 47  % Final   Neutro Abs 11/01/2024 4.4  1.7 - 7.7 K/uL Final   Lymphocytes Relative 11/01/2024 38  % Final   Lymphs Abs 11/01/2024 3.6  0.7 - 4.0 K/uL Final   Monocytes Relative 11/01/2024 8  % Final   Monocytes Absolute 11/01/2024 0.8  0.1 - 1.0 K/uL Final   Eosinophils Relative 11/01/2024 6  % Final   Eosinophils Absolute 11/01/2024 0.6 (H)  0.0 - 0.5 K/uL Final   Basophils Relative 11/01/2024 1  % Final   Basophils Absolute 11/01/2024 0.1  0.0 - 0.1 K/uL Final   Immature Granulocytes 11/01/2024 0  % Final   Abs Immature Granulocytes 11/01/2024 0.04  0.00 - 0.07 K/uL Final  Appointment on 10/18/2024  Component Date Value Ref Range Status   IgG (Immunoglobin G), Serum 10/18/2024 725  603 - 1,613 mg/dL Final   IgA 89/78/7974 128  90 - 386 mg/dL Final   IgM (Immunoglobulin M), Srm 10/18/2024 40  20 - 172 mg/dL Final   Total Protein ELP 10/18/2024 7.1  6.0 - 8.5 g/dL Corrected   Albumin  SerPl Elph-Mcnc 10/18/2024 4.0  2.9 - 4.4 g/dL Corrected   Alpha 1 89/78/7974 0.4  0.0 - 0.4 g/dL Corrected   Alpha2 Glob  SerPl Elph-Mcnc 10/18/2024 0.9  0.4 - 1.0 g/dL Corrected   B-Globulin SerPl Elph-Mcnc 10/18/2024 1.1  0.7 - 1.3 g/dL Corrected   Gamma Glob SerPl Elph-Mcnc 10/18/2024 0.7  0.4 - 1.8 g/dL Corrected   M Protein SerPl Elph-Mcnc 10/18/2024 0.4 (H)  Not Observed g/dL Corrected   Globulin, Total 10/18/2024 3.1  2.2 - 3.9 g/dL Corrected   Albumin /Glob SerPl 10/18/2024 1.3  0.7 - 1.7 Corrected   IFE 1 10/18/2024 Comment (A)   Corrected   Please Note 10/18/2024 Comment   Corrected   Sodium  10/18/2024 139  135 - 145 mmol/L Final   Potassium 10/18/2024 3.7  3.5 - 5.1 mmol/L Final   Chloride 10/18/2024 106  98 - 111 mmol/L Final   CO2 10/18/2024 25  22 - 32 mmol/L Final   Glucose, Bld 10/18/2024 115 (H)  70 - 99 mg/dL Final   BUN 89/78/7974 13  6 - 20 mg/dL Final   Creatinine, Ser 10/18/2024 0.69  0.61 - 1.24 mg/dL Final   Calcium  10/18/2024 9.4  8.9 - 10.3 mg/dL Final   Total Protein 89/78/7974 6.9  6.5 - 8.1 g/dL Final   Albumin  10/18/2024 4.3  3.5 - 5.0 g/dL Final   AST 89/78/7974 15  15 - 41 U/L Final   ALT 10/18/2024 15  0 - 44 U/L Final   Alkaline Phosphatase 10/18/2024 53  38 - 126 U/L Final   Total Bilirubin 10/18/2024 0.8  0.0 - 1.2 mg/dL Final   GFR, Estimated 10/18/2024 >60  >60 mL/min Final   Anion gap 10/18/2024 8  5 - 15 Final   WBC Count 10/18/2024 10.2  4.0 - 10.5 K/uL Final   RBC 10/18/2024 6.27 (H)  4.22 - 5.81 MIL/uL Final   Hemoglobin 10/18/2024 17.0  13.0 - 17.0 g/dL Final   HCT 89/78/7974 50.3  39.0 - 52.0 % Final   MCV 10/18/2024 80.2  80.0 - 100.0 fL Final   MCH 10/18/2024 27.1  26.0 - 34.0 pg Final   MCHC 10/18/2024 33.8  30.0 - 36.0 g/dL Final   RDW 89/78/7974 14.6  11.5 - 15.5 % Final   Platelet Count 10/18/2024 193  150 - 400 K/uL Final   nRBC 10/18/2024 0.0  0.0 - 0.2 % Final   Neutrophils Relative % 10/18/2024 42  % Final   Neutro Abs 10/18/2024 4.3  1.7 - 7.7 K/uL Final   Lymphocytes Relative 10/18/2024 45  % Final   Lymphs Abs 10/18/2024 4.6 (H)  0.7 - 4.0 K/uL Final   Monocytes Relative 10/18/2024 9  % Final   Monocytes Absolute 10/18/2024 0.9  0.1 - 1.0 K/uL Final   Eosinophils Relative 10/18/2024 3  % Final   Eosinophils Absolute 10/18/2024 0.3  0.0 - 0.5 K/uL Final   Basophils Relative 10/18/2024 1  % Final   Basophils Absolute 10/18/2024 0.1  0.0 - 0.1 K/uL Final   Immature Granulocytes 10/18/2024 0  % Final   Abs Immature Granulocytes 10/18/2024 0.02  0.00 - 0.07 K/uL Final  Lab on 10/06/2024  Component Date Value Ref Range  Status   Hgb A1c MFr Bld 10/06/2024 6.9 (H)  4.8 - 5.6 % Final   Est. average glucose Bld gHb Est-m* 10/06/2024 151  mg/dL Final  Office Visit on 10/06/2024  Component Date Value Ref Range Status   WBC 10/06/2024 7.7  4.0 - 10.5 K/uL Final   RBC 10/06/2024 5.86 (H)  4.22 - 5.81 Mil/uL Final   Hemoglobin 10/06/2024 15.7  13.0 -  17.0 g/dL Final   HCT 89/90/7974 48.8  39.0 - 52.0 % Final   MCV 10/06/2024 83.3  78.0 - 100.0 fl Final   MCHC 10/06/2024 32.1  30.0 - 36.0 g/dL Final   RDW 89/90/7974 15.4  11.5 - 15.5 % Final   Platelets 10/06/2024 174.0  150.0 - 400.0 K/uL Final   Neutrophils Relative % 10/06/2024 51.5  43.0 - 77.0 % Final   Lymphocytes Relative 10/06/2024 36.7  12.0 - 46.0 % Final   Monocytes Relative 10/06/2024 9.9  3.0 - 12.0 % Final   Eosinophils Relative 10/06/2024 1.6  0.0 - 5.0 % Final   Basophils Relative 10/06/2024 0.3  0.0 - 3.0 % Final   Neutro Abs 10/06/2024 4.0  1.4 - 7.7 K/uL Final   Lymphs Abs 10/06/2024 2.8  0.7 - 4.0 K/uL Final   Monocytes Absolute 10/06/2024 0.8  0.1 - 1.0 K/uL Final   Eosinophils Absolute 10/06/2024 0.1  0.0 - 0.7 K/uL Final   Basophils Absolute 10/06/2024 0.0  0.0 - 0.1 K/uL Final   Sodium 10/06/2024 138  135 - 145 mEq/L Final   Potassium 10/06/2024 3.6  3.5 - 5.1 mEq/L Final   Chloride 10/06/2024 103  96 - 112 mEq/L Final   CO2 10/06/2024 26  19 - 32 mEq/L Final   Glucose, Bld 10/06/2024 132 (H)  70 - 99 mg/dL Final   BUN 89/90/7974 18  6 - 23 mg/dL Final   Creatinine, Ser 10/06/2024 0.73  0.40 - 1.50 mg/dL Final   Total Bilirubin 10/06/2024 0.8  0.2 - 1.2 mg/dL Final   Alkaline Phosphatase 10/06/2024 50  39 - 117 U/L Final   AST 10/06/2024 14  0 - 37 U/L Final   ALT 10/06/2024 17  0 - 53 U/L Final   Total Protein 10/06/2024 6.5  6.0 - 8.3 g/dL Final   Albumin  10/06/2024 4.2  3.5 - 5.2 g/dL Final   GFR 89/90/7974 100.81  >60.00 mL/min Final   Calcium  10/06/2024 8.9  8.4 - 10.5 mg/dL Final   Cholesterol 89/90/7974 209 (H)  0 - 200  mg/dL Final   Triglycerides 89/90/7974 94.0  0.0 - 149.0 mg/dL Final   HDL 89/90/7974 49.10  >39.00 mg/dL Final   VLDL 89/90/7974 18.8  0.0 - 40.0 mg/dL Final   LDL Cholesterol 10/06/2024 141 (H)  0 - 99 mg/dL Final   Total CHOL/HDL Ratio 10/06/2024 4   Final   NonHDL 10/06/2024 160.12   Final   Microalb, Ur 10/06/2024 1.3  0.0 - 1.9 mg/dL Final   Creatinine,U 89/90/7974 101.1  mg/dL Final   Microalb Creat Ratio 10/06/2024 12.5  0.0 - 30.0 mg/g Final   VITD 10/06/2024 14.91 (L)  30.00 - 100.00 ng/mL Final   Aldosterone 10/06/2024 6  *** ng/dL Final   Renin Activity 10/06/2024 1.37  0.25 - 5.82 ng/mL/h Final   ALDO / PRA Ratio 10/06/2024 4.4  0.9 - 28.9 Ratio Final  Appointment on 10/04/2024  Component Date Value Ref Range Status   WBC Count 10/04/2024 8.7  4.0 - 10.5 K/uL Final   RBC 10/04/2024 5.62  4.22 - 5.81 MIL/uL Final   Hemoglobin 10/04/2024 15.4  13.0 - 17.0 g/dL Final   HCT 89/92/7974 44.8  39.0 - 52.0 % Final   MCV 10/04/2024 79.7 (L)  80.0 - 100.0 fL Final   MCH 10/04/2024 27.4  26.0 - 34.0 pg Final   MCHC 10/04/2024 34.4  30.0 - 36.0 g/dL Final   RDW 89/92/7974 14.9  11.5 -  15.5 % Final   Platelet Count 10/04/2024 161  150 - 400 K/uL Final   nRBC 10/04/2024 0.0  0.0 - 0.2 % Final   Neutrophils Relative % 10/04/2024 47  % Final   Neutro Abs 10/04/2024 4.2  1.7 - 7.7 K/uL Final   Lymphocytes Relative 10/04/2024 38  % Final   Lymphs Abs 10/04/2024 3.3  0.7 - 4.0 K/uL Final   Monocytes Relative 10/04/2024 11  % Final   Monocytes Absolute 10/04/2024 1.0  0.1 - 1.0 K/uL Final   Eosinophils Relative 10/04/2024 3  % Final   Eosinophils Absolute 10/04/2024 0.2  0.0 - 0.5 K/uL Final   Basophils Relative 10/04/2024 1  % Final   Basophils Absolute 10/04/2024 0.0  0.0 - 0.1 K/uL Final   Immature Granulocytes 10/04/2024 0  % Final   Abs Immature Granulocytes 10/04/2024 0.02  0.00 - 0.07 K/uL Final   Sodium 10/04/2024 139  135 - 145 mmol/L Final   Potassium 10/04/2024 3.9  3.5 -  5.1 mmol/L Final   Chloride 10/04/2024 109  98 - 111 mmol/L Final   CO2 10/04/2024 26  22 - 32 mmol/L Final   Glucose, Bld 10/04/2024 151 (H)  70 - 99 mg/dL Final   BUN 89/92/7974 12  6 - 20 mg/dL Final   Creatinine, Ser 10/04/2024 0.61  0.61 - 1.24 mg/dL Final   Calcium  10/04/2024 8.7 (L)  8.9 - 10.3 mg/dL Final   Total Protein 89/92/7974 6.2 (L)  6.5 - 8.1 g/dL Final   Albumin  10/04/2024 3.8  3.5 - 5.0 g/dL Final   AST 89/92/7974 11 (L)  15 - 41 U/L Final   ALT 10/04/2024 18  0 - 44 U/L Final   Alkaline Phosphatase 10/04/2024 51  38 - 126 U/L Final   Total Bilirubin 10/04/2024 0.6  0.0 - 1.2 mg/dL Final   GFR, Estimated 10/04/2024 >60  >60 mL/min Final   Anion gap 10/04/2024 4 (L)  5 - 15 Final   IgG (Immunoglobin G), Serum 10/04/2024 616  603 - 1,613 mg/dL Final   IgA 89/92/7974 108  90 - 386 mg/dL Final   IgM (Immunoglobulin M), Srm 10/04/2024 25  20 - 172 mg/dL Final   Total Protein ELP 10/04/2024 5.5 (L)  6.0 - 8.5 g/dL Corrected   Albumin  SerPl Elph-Mcnc 10/04/2024 3.2  2.9 - 4.4 g/dL Corrected   Alpha 1 89/92/7974 0.1  0.0 - 0.4 g/dL Corrected   Alpha2 Glob SerPl Elph-Mcnc 10/04/2024 0.8  0.4 - 1.0 g/dL Corrected   B-Globulin SerPl Elph-Mcnc 10/04/2024 0.8  0.7 - 1.3 g/dL Corrected   Gamma Glob SerPl Elph-Mcnc 10/04/2024 0.6  0.4 - 1.8 g/dL Corrected   M Protein SerPl Elph-Mcnc 10/04/2024 0.3 (H)  Not Observed g/dL Corrected   Globulin, Total 10/04/2024 2.3  2.2 - 3.9 g/dL Corrected   Albumin /Glob SerPl 10/04/2024 1.4  0.7 - 1.7 Corrected   IFE 1 10/04/2024 Comment (A)   Corrected   Please Note 10/04/2024 Comment   Corrected  There may be more visits with results that are not included.  No image results found. No results found.       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 Kidney function is stable without proteinuria, but diabetes remains a threat to kidney health. Ordered A1c, cholesterol, and microalbumin tests. Continue current diabetes medications: Mounjaro , Farxiga , Metformin . Encourage dietary modifications, including reducing rice intake and considering low-sugar yogurt.  Primary hypertension   Blood pressure is well-controlled with the current medication regimen. Continue current antihypertensive medication: Losartan .  Vitamin D  deficiency   Vitamin D  levels remain insufficient despite supplementation, though calcium  levels have improved. Continue vitamin D  supplementation.  Microcytosis   Chronic condition with consistently small red blood cells. Continue monitoring blood work.  Recording duration: 13 minutes   }  ORDER ASSOCIATIONS  #   DIAGNOSIS / CONDITION ICD-10 ENCOUNTER ORDER  No diagnosis found.       Orders Placed in Encounter:   Lab Orders  No laboratory test(s) ordered today   Imaging Orders  No imaging studies ordered today   Referral Orders  No referral(s) requested today   No orders of the defined types were placed in this encounter.   No orders of the defined types were placed in this encounter. ED Discharge Orders     None         This document was synthesized by artificial intelligence (Abridge) using HIPAA-compliant recording of the clinical interaction;   We discussed the use of AI scribe software for clinical note transcription with the patient, who gave verbal consent to proceed. additional Info: This encounter employed state-of-the-art, real-time, collaborative documentation. The patient actively reviewed and assisted in updating their electronic  medical record on a shared screen, ensuring transparency and facilitating joint problem-solving for the problem list, overview, and plan. This approach promotes accurate, informed care. The treatment plan was discussed and reviewed in detail, including medication safety, potential side effects, and all patient questions. We confirmed understanding and comfort with the plan. Follow-up instructions were established, including contacting the office for any concerns, returning if symptoms worsen, persist, or new symptoms develop, and precautions for potential emergency department visits.

## 2025-01-14 ENCOUNTER — Other Ambulatory Visit: Payer: Self-pay

## 2025-01-14 LAB — MULTIPLE MYELOMA PANEL, SERUM
Albumin SerPl Elph-Mcnc: 3.5 g/dL (ref 2.9–4.4)
Albumin/Glob SerPl: 1.6 (ref 0.7–1.7)
Alpha 1: 0.1 g/dL (ref 0.0–0.4)
Alpha2 Glob SerPl Elph-Mcnc: 0.9 g/dL (ref 0.4–1.0)
B-Globulin SerPl Elph-Mcnc: 0.8 g/dL (ref 0.7–1.3)
Gamma Glob SerPl Elph-Mcnc: 0.5 g/dL (ref 0.4–1.8)
Globulin, Total: 2.3 g/dL (ref 2.2–3.9)
IgA: 116 mg/dL (ref 90–386)
IgG (Immunoglobin G), Serum: 607 mg/dL (ref 603–1613)
IgM (Immunoglobulin M), Srm: 27 mg/dL (ref 20–172)
M Protein SerPl Elph-Mcnc: 0.3 g/dL — ABNORMAL HIGH
Total Protein ELP: 5.8 g/dL — ABNORMAL LOW (ref 6.0–8.5)

## 2025-01-14 NOTE — Assessment & Plan Note (Signed)
 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 Kidney function is stable without proteinuria, but diabetes remains a threat to kidney health. Ordered A1c, cholesterol, and microalbumin tests. Continue current diabetes medications: Mounjaro , Farxiga , Metformin . Encourage dietary modifications, including reducing rice intake and considering low-sugar yogurt.

## 2025-01-14 NOTE — Assessment & Plan Note (Signed)
 Blood pressure is well-controlled with the current medication regimen. Continue current antihypertensive medication: Losartan .

## 2025-01-14 NOTE — Assessment & Plan Note (Signed)
 Vitamin D  levels remain insufficient despite supplementation, though calcium  levels have improved. Continue vitamin D  supplementation.

## 2025-01-14 NOTE — Assessment & Plan Note (Signed)
 Chronic condition with consistently small red blood cells. Continue monitoring blood work.

## 2025-01-16 ENCOUNTER — Other Ambulatory Visit: Payer: Self-pay | Admitting: Hematology

## 2025-01-16 ENCOUNTER — Encounter: Payer: Self-pay | Admitting: Hematology

## 2025-01-16 DIAGNOSIS — C9000 Multiple myeloma not having achieved remission: Secondary | ICD-10-CM

## 2025-01-17 ENCOUNTER — Other Ambulatory Visit (HOSPITAL_COMMUNITY): Payer: Self-pay

## 2025-01-17 ENCOUNTER — Encounter: Payer: Self-pay | Admitting: Hematology

## 2025-01-17 ENCOUNTER — Telehealth: Payer: Self-pay

## 2025-01-17 NOTE — Telephone Encounter (Signed)
 Oral Oncology Patient Advocate Encounter   Was successful in obtaining a copay card for Lenalidomide  (Teva Brand).  This copay card will make the patients copay $0.  If it does come back a copay amount when we send in refills, we may have to see which generic brand they used and see if we can find a coupon for that brand.  The billing information is as follows and has been shared with [facility].      Lucie Lamer, CPhT [place]  [facility] Oncology Pharmacy Patient Advocate Specialist [facility] [place] Phone: [phone]  Fax: [phone] [email or web address]

## 2025-01-24 ENCOUNTER — Inpatient Hospital Stay

## 2025-01-24 ENCOUNTER — Other Ambulatory Visit: Payer: Self-pay

## 2025-01-24 ENCOUNTER — Inpatient Hospital Stay: Admitting: Hematology

## 2025-01-24 VITALS — BP 151/90 | HR 86 | Temp 98.0°F | Resp 16 | Wt 182.0 lb

## 2025-01-24 DIAGNOSIS — Z7189 Other specified counseling: Secondary | ICD-10-CM

## 2025-01-24 DIAGNOSIS — C9001 Multiple myeloma in remission: Secondary | ICD-10-CM

## 2025-01-24 DIAGNOSIS — Z5112 Encounter for antineoplastic immunotherapy: Secondary | ICD-10-CM | POA: Diagnosis not present

## 2025-01-24 LAB — CBC WITH DIFFERENTIAL (CANCER CENTER ONLY)
Abs Immature Granulocytes: 0.03 10*3/uL (ref 0.00–0.07)
Basophils Absolute: 0.1 10*3/uL (ref 0.0–0.1)
Basophils Relative: 1 %
Eosinophils Absolute: 0.2 10*3/uL (ref 0.0–0.5)
Eosinophils Relative: 2 %
HCT: 47.8 % (ref 39.0–52.0)
Hemoglobin: 16.4 g/dL (ref 13.0–17.0)
Immature Granulocytes: 0 %
Lymphocytes Relative: 39 %
Lymphs Abs: 4.4 10*3/uL — ABNORMAL HIGH (ref 0.7–4.0)
MCH: 26.6 pg (ref 26.0–34.0)
MCHC: 34.3 g/dL (ref 30.0–36.0)
MCV: 77.6 fL — ABNORMAL LOW (ref 80.0–100.0)
Monocytes Absolute: 0.7 10*3/uL (ref 0.1–1.0)
Monocytes Relative: 6 %
Neutro Abs: 5.9 10*3/uL (ref 1.7–7.7)
Neutrophils Relative %: 52 %
Platelet Count: 193 10*3/uL (ref 150–400)
RBC: 6.16 MIL/uL — ABNORMAL HIGH (ref 4.22–5.81)
RDW: 15.8 % — ABNORMAL HIGH (ref 11.5–15.5)
WBC Count: 11.2 10*3/uL — ABNORMAL HIGH (ref 4.0–10.5)
nRBC: 0 % (ref 0.0–0.2)

## 2025-01-24 LAB — COMPREHENSIVE METABOLIC PANEL WITH GFR
ALT: 39 U/L (ref 0–44)
AST: 20 U/L (ref 15–41)
Albumin: 3.8 g/dL (ref 3.5–5.0)
Alkaline Phosphatase: 86 U/L (ref 38–126)
Anion gap: 14 (ref 5–15)
BUN: 12 mg/dL (ref 6–20)
CO2: 23 mmol/L (ref 22–32)
Calcium: 8.9 mg/dL (ref 8.9–10.3)
Chloride: 99 mmol/L (ref 98–111)
Creatinine, Ser: 0.9 mg/dL (ref 0.61–1.24)
GFR, Estimated: >60 mL/min
Glucose, Bld: 252 mg/dL — ABNORMAL HIGH (ref 70–99)
Potassium: 3.6 mmol/L (ref 3.5–5.1)
Sodium: 136 mmol/L (ref 135–145)
Total Bilirubin: 0.5 mg/dL (ref 0.0–1.2)
Total Protein: 6.2 g/dL — ABNORMAL LOW (ref 6.5–8.1)

## 2025-01-24 MED ORDER — DEXAMETHASONE 4 MG PO TABS
2.0000 mg | ORAL_TABLET | Freq: Once | ORAL | Status: AC
  Administered 2025-01-24: 2 mg via ORAL
  Filled 2025-01-24: qty 1

## 2025-01-24 MED ORDER — DIPHENHYDRAMINE HCL 25 MG PO CAPS
50.0000 mg | ORAL_CAPSULE | Freq: Once | ORAL | Status: AC
  Administered 2025-01-24: 50 mg via ORAL
  Filled 2025-01-24: qty 2

## 2025-01-24 MED ORDER — FAMOTIDINE 20 MG PO TABS
20.0000 mg | ORAL_TABLET | Freq: Once | ORAL | Status: AC
  Administered 2025-01-24: 20 mg via ORAL
  Filled 2025-01-24: qty 1

## 2025-01-24 MED ORDER — ACETAMINOPHEN 325 MG PO TABS
650.0000 mg | ORAL_TABLET | Freq: Once | ORAL | Status: AC
  Administered 2025-01-24: 650 mg via ORAL
  Filled 2025-01-24: qty 2

## 2025-01-24 MED ORDER — ZOLEDRONIC ACID 4 MG/100ML IV SOLN
4.0000 mg | Freq: Once | INTRAVENOUS | Status: AC
  Administered 2025-01-24: 4 mg via INTRAVENOUS
  Filled 2025-01-24: qty 100

## 2025-01-24 MED ORDER — DARATUMUMAB-HYALURONIDASE-FIHJ 1800-30000 MG-UT/15ML ~~LOC~~ SOLN
1800.0000 mg | Freq: Once | SUBCUTANEOUS | Status: AC
  Administered 2025-01-24: 1800 mg via SUBCUTANEOUS
  Filled 2025-01-24: qty 15

## 2025-01-24 MED ORDER — MONTELUKAST SODIUM 10 MG PO TABS
10.0000 mg | ORAL_TABLET | Freq: Once | ORAL | Status: AC
  Administered 2025-01-24: 10 mg via ORAL
  Filled 2025-01-24: qty 1

## 2025-01-24 NOTE — Patient Instructions (Signed)
 [facility] - A DEPT OF [place]  Discharge Instructions: Thank you for choosing [facility] to provide your oncology and hematology care.   If you have a lab appointment with the Cancer Center, please go directly to the Cancer Center and check in at the registration area.   Wear comfortable clothing and clothing appropriate for easy access to any Portacath or PICC line.   We strive to give you quality time with your provider. You may need to reschedule your appointment if you arrive late (15 or more minutes).  Arriving late affects you and other patients whose appointments are after yours.  Also, if you miss three or more appointments without notifying the office, you may be dismissed from the clinic at the providers discretion.      For prescription refill requests, have your pharmacy contact our office and allow 72 hours for refills to be completed.    Today you received the following chemotherapy and/or immunotherapy agent: Daratumumab  (Darzalex  Faspro), Zometa    To help prevent nausea and vomiting after your treatment, we encourage you to take your nausea medication as directed.  BELOW ARE SYMPTOMS THAT SHOULD BE REPORTED IMMEDIATELY: *FEVER GREATER THAN 100.4 F (38 C) OR HIGHER *CHILLS OR SWEATING *NAUSEA AND VOMITING THAT IS NOT CONTROLLED WITH YOUR NAUSEA MEDICATION *UNUSUAL SHORTNESS OF BREATH *UNUSUAL BRUISING OR BLEEDING *URINARY PROBLEMS (pain or burning when urinating, or frequent urination) *BOWEL PROBLEMS (unusual diarrhea, constipation, pain near the anus) TENDERNESS IN MOUTH AND THROAT WITH OR WITHOUT PRESENCE OF ULCERS (sore throat, sores in mouth, or a toothache) UNUSUAL RASH, SWELLING OR PAIN  UNUSUAL VAGINAL DISCHARGE OR ITCHING   Items with * indicate a potential emergency and should be followed up as soon as possible or go to the Emergency Department if any problems should occur.  Please show the CHEMOTHERAPY  ALERT CARD or IMMUNOTHERAPY ALERT CARD at check-in to the Emergency Department and triage nurse.  Should you have questions after your visit or need to cancel or reschedule your appointment, please contact [facility] - A DEPT OF [facility]  Dept: [phone]  and follow the prompts.  Office hours are 8:00 a.m. to 4:30 p.m. Monday - Friday. Please note that voicemails left after 4:00 p.m. may not be returned until the following business day.  We are closed weekends and major holidays. You have access to a nurse at all times for urgent questions. Please call the main number to the clinic Dept: [phone] and follow the prompts.   For any non-urgent questions, you may also contact your provider using MyChart. We now offer e-Visits for anyone 1 and older to request care online for non-urgent symptoms. For details visit [email or web address].   Also download the MyChart app! Go to the app store, search MyChart, open the app, select [place], and log in with your MyChart username and password.

## 2025-01-26 ENCOUNTER — Other Ambulatory Visit: Payer: Self-pay

## 2025-01-26 LAB — MULTIPLE MYELOMA PANEL, SERUM
Albumin SerPl Elph-Mcnc: 3.1 g/dL (ref 2.9–4.4)
Albumin/Glob SerPl: 1.2 (ref 0.7–1.7)
Alpha 1: 0.1 g/dL (ref 0.0–0.4)
Alpha2 Glob SerPl Elph-Mcnc: 1.1 g/dL — ABNORMAL HIGH (ref 0.4–1.0)
B-Globulin SerPl Elph-Mcnc: 0.9 g/dL (ref 0.7–1.3)
Gamma Glob SerPl Elph-Mcnc: 0.5 g/dL (ref 0.4–1.8)
Globulin, Total: 2.7 g/dL (ref 2.2–3.9)
IgA: 129 mg/dL (ref 90–386)
IgG (Immunoglobin G), Serum: 640 mg/dL (ref 603–1613)
IgM (Immunoglobulin M), Srm: 32 mg/dL (ref 20–172)
M Protein SerPl Elph-Mcnc: 0.3 g/dL — ABNORMAL HIGH
Total Protein ELP: 5.8 g/dL — ABNORMAL LOW (ref 6.0–8.5)

## 2025-01-27 NOTE — Progress Notes (Signed)
 Due to weather there was a delay in pt getting Revlimid . Pt given Revlimid  on 01/27/25. It is delivered to the [facility]. Pt to start taking this cycle on 01/28/25 - 02/17/25. Pt will take off 02/18/25 - 2/27 /26. New cycle will start on 02/25/25. Pt aware when to start this medication.

## 2025-01-30 ENCOUNTER — Inpatient Hospital Stay

## 2025-01-30 ENCOUNTER — Encounter: Payer: Self-pay | Admitting: Hematology

## 2025-01-30 ENCOUNTER — Inpatient Hospital Stay: Admitting: Hematology

## 2025-01-30 NOTE — Progress Notes (Signed)
 This encounter was created in error - please disregard.

## 2025-02-03 ENCOUNTER — Other Ambulatory Visit: Payer: Self-pay | Admitting: Physician Assistant

## 2025-02-03 DIAGNOSIS — Z7189 Other specified counseling: Secondary | ICD-10-CM

## 2025-02-03 DIAGNOSIS — C9001 Multiple myeloma in remission: Secondary | ICD-10-CM

## 2025-02-06 ENCOUNTER — Inpatient Hospital Stay

## 2025-02-06 ENCOUNTER — Inpatient Hospital Stay: Attending: Hematology

## 2025-02-06 ENCOUNTER — Inpatient Hospital Stay: Admitting: Hematology

## 2025-02-21 ENCOUNTER — Inpatient Hospital Stay

## 2025-02-21 ENCOUNTER — Inpatient Hospital Stay: Admitting: Physician Assistant

## 2025-04-13 ENCOUNTER — Ambulatory Visit: Admitting: Internal Medicine
